# Patient Record
Sex: Female | Born: 1940 | Race: White | Hispanic: No | Smoking: Former smoker
Health system: Southern US, Community
[De-identification: ages and names within clinical notes are randomized; demographics above are authoritative.]

## PROBLEM LIST (undated history)

## (undated) DIAGNOSIS — R52 Pain, unspecified: Secondary | ICD-10-CM

## (undated) DIAGNOSIS — G2581 Restless legs syndrome: Secondary | ICD-10-CM

## (undated) DIAGNOSIS — I499 Cardiac arrhythmia, unspecified: Secondary | ICD-10-CM

## (undated) DIAGNOSIS — I10 Essential (primary) hypertension: Secondary | ICD-10-CM

## (undated) DIAGNOSIS — R0600 Dyspnea, unspecified: Secondary | ICD-10-CM

## (undated) DIAGNOSIS — F329 Major depressive disorder, single episode, unspecified: Secondary | ICD-10-CM

## (undated) DIAGNOSIS — H269 Unspecified cataract: Secondary | ICD-10-CM

## (undated) DIAGNOSIS — K219 Gastro-esophageal reflux disease without esophagitis: Secondary | ICD-10-CM

## (undated) DIAGNOSIS — Z87898 Personal history of other specified conditions: Secondary | ICD-10-CM

## (undated) DIAGNOSIS — F419 Anxiety disorder, unspecified: Secondary | ICD-10-CM

## (undated) DIAGNOSIS — R0601 Orthopnea: Secondary | ICD-10-CM

## (undated) DIAGNOSIS — R32 Unspecified urinary incontinence: Secondary | ICD-10-CM

## (undated) DIAGNOSIS — F22 Delusional disorders: Secondary | ICD-10-CM

## (undated) DIAGNOSIS — M1990 Unspecified osteoarthritis, unspecified site: Secondary | ICD-10-CM

## (undated) DIAGNOSIS — Z8719 Personal history of other diseases of the digestive system: Secondary | ICD-10-CM

## (undated) DIAGNOSIS — R609 Edema, unspecified: Secondary | ICD-10-CM

## (undated) DIAGNOSIS — F32A Depression, unspecified: Secondary | ICD-10-CM

## (undated) DIAGNOSIS — J449 Chronic obstructive pulmonary disease, unspecified: Secondary | ICD-10-CM

## (undated) DIAGNOSIS — R251 Tremor, unspecified: Secondary | ICD-10-CM

## (undated) DIAGNOSIS — H9190 Unspecified hearing loss, unspecified ear: Secondary | ICD-10-CM

## (undated) HISTORY — PX: TONSILLECTOMY AND ADENOIDECTOMY: SHX28

## (undated) HISTORY — PX: CARDIAC CATHETERIZATION: SHX172

## (undated) HISTORY — DX: Unspecified cataract: H26.9

## (undated) HISTORY — DX: Anxiety disorder, unspecified: F41.9

---

## 1986-06-07 HISTORY — PX: ABDOMINAL HYSTERECTOMY: SHX81

## 1986-06-07 HISTORY — PX: APPENDECTOMY: SHX54

## 1998-06-07 DIAGNOSIS — M810 Age-related osteoporosis without current pathological fracture: Secondary | ICD-10-CM | POA: Insufficient documentation

## 2002-06-07 HISTORY — PX: TRIGGER FINGER RELEASE: SHX641

## 2004-04-17 ENCOUNTER — Ambulatory Visit: Payer: Self-pay | Admitting: Family Medicine

## 2005-04-13 ENCOUNTER — Other Ambulatory Visit: Payer: Self-pay

## 2005-04-13 ENCOUNTER — Emergency Department: Payer: Self-pay | Admitting: Emergency Medicine

## 2005-08-24 ENCOUNTER — Ambulatory Visit: Payer: Self-pay | Admitting: Family Medicine

## 2005-10-12 ENCOUNTER — Ambulatory Visit: Payer: Self-pay | Admitting: Family Medicine

## 2006-02-17 ENCOUNTER — Ambulatory Visit: Payer: Self-pay | Admitting: Ophthalmology

## 2006-02-18 ENCOUNTER — Ambulatory Visit: Payer: Self-pay | Admitting: Family Medicine

## 2006-03-04 ENCOUNTER — Ambulatory Visit: Payer: Self-pay | Admitting: Family Medicine

## 2007-10-12 ENCOUNTER — Ambulatory Visit: Payer: Self-pay | Admitting: Family Medicine

## 2007-12-05 ENCOUNTER — Ambulatory Visit: Payer: Self-pay | Admitting: Family Medicine

## 2008-01-31 ENCOUNTER — Ambulatory Visit: Payer: Self-pay | Admitting: Gastroenterology

## 2008-01-31 LAB — HM COLONOSCOPY

## 2008-09-10 ENCOUNTER — Ambulatory Visit: Payer: Self-pay | Admitting: Family Medicine

## 2008-09-26 ENCOUNTER — Ambulatory Visit: Payer: Self-pay | Admitting: Family Medicine

## 2008-09-26 DIAGNOSIS — R4701 Aphasia: Secondary | ICD-10-CM | POA: Insufficient documentation

## 2008-11-26 DIAGNOSIS — R5381 Other malaise: Secondary | ICD-10-CM | POA: Insufficient documentation

## 2009-09-02 ENCOUNTER — Inpatient Hospital Stay: Payer: Self-pay | Admitting: Student

## 2009-11-07 ENCOUNTER — Inpatient Hospital Stay: Payer: Self-pay | Admitting: Student

## 2010-02-03 ENCOUNTER — Ambulatory Visit: Payer: Self-pay | Admitting: Family Medicine

## 2010-06-17 ENCOUNTER — Ambulatory Visit: Payer: Self-pay

## 2010-07-16 ENCOUNTER — Ambulatory Visit: Payer: Self-pay | Admitting: Family Medicine

## 2011-04-27 ENCOUNTER — Ambulatory Visit: Payer: Self-pay | Admitting: Family Medicine

## 2011-07-22 ENCOUNTER — Ambulatory Visit: Payer: Self-pay | Admitting: Family Medicine

## 2011-11-10 ENCOUNTER — Emergency Department: Payer: Self-pay | Admitting: *Deleted

## 2011-11-10 LAB — BASIC METABOLIC PANEL
Anion Gap: 11 (ref 7–16)
BUN: 19 mg/dL — ABNORMAL HIGH (ref 7–18)
Chloride: 105 mmol/L (ref 98–107)
Co2: 21 mmol/L (ref 21–32)
Creatinine: 0.73 mg/dL (ref 0.60–1.30)
EGFR (African American): >60
Osmolality: 275 (ref 275–301)

## 2011-11-10 LAB — CK TOTAL AND CKMB (NOT AT ARMC): CK-MB: 2.1 ng/mL (ref 0.5–3.6)

## 2011-11-10 LAB — TROPONIN I: Troponin-I: <0.02 ng/mL

## 2011-11-10 LAB — CBC
HCT: 43.8 % (ref 35.0–47.0)
HGB: 14.7 g/dL (ref 12.0–16.0)
MCH: 30.6 pg (ref 26.0–34.0)
Platelet: 185 10*3/uL (ref 150–440)
RDW: 14.6 % — ABNORMAL HIGH (ref 11.5–14.5)
WBC: 6 10*3/uL (ref 3.6–11.0)

## 2012-02-29 ENCOUNTER — Ambulatory Visit: Payer: Self-pay | Admitting: Family Medicine

## 2012-05-17 ENCOUNTER — Inpatient Hospital Stay: Payer: Self-pay | Admitting: Internal Medicine

## 2012-05-17 LAB — CBC
MCH: 29.7 pg (ref 26.0–34.0)
MCHC: 32.7 g/dL (ref 32.0–36.0)
Platelet: 177 10*3/uL (ref 150–440)
RBC: 4.3 10*6/uL (ref 3.80–5.20)

## 2012-05-17 LAB — COMPREHENSIVE METABOLIC PANEL
Albumin: 3.4 g/dL (ref 3.4–5.0)
Alkaline Phosphatase: 92 U/L (ref 50–136)
Anion Gap: 9 (ref 7–16)
Chloride: 100 mmol/L (ref 98–107)
Co2: 25 mmol/L (ref 21–32)
Creatinine: 0.74 mg/dL (ref 0.60–1.30)
EGFR (African American): >60
Glucose: 138 mg/dL — ABNORMAL HIGH (ref 65–99)
Osmolality: 270 (ref 275–301)
Potassium: 3.9 mmol/L (ref 3.5–5.1)
SGPT (ALT): 23 U/L (ref 12–78)
Sodium: 134 mmol/L — ABNORMAL LOW (ref 136–145)
Total Protein: 6.7 g/dL (ref 6.4–8.2)

## 2012-05-17 LAB — CK TOTAL AND CKMB (NOT AT ARMC)
CK, Total: 56 U/L (ref 21–215)
CK-MB: 0.9 ng/mL (ref 0.5–3.6)
CK-MB: 1.3 ng/mL (ref 0.5–3.6)

## 2012-05-17 LAB — PRO B NATRIURETIC PEPTIDE: B-Type Natriuretic Peptide: 260 pg/mL — ABNORMAL HIGH (ref 0–125)

## 2012-05-18 LAB — BASIC METABOLIC PANEL
Anion Gap: 10 (ref 7–16)
BUN: 17 mg/dL (ref 7–18)
Calcium, Total: 8.9 mg/dL (ref 8.5–10.1)
EGFR (Non-African Amer.): >60
Glucose: 144 mg/dL — ABNORMAL HIGH (ref 65–99)
Osmolality: 269 (ref 275–301)
Sodium: 132 mmol/L — ABNORMAL LOW (ref 136–145)

## 2012-05-18 LAB — CBC WITH DIFFERENTIAL/PLATELET
Eosinophil #: 0 10*3/uL (ref 0.0–0.7)
Eosinophil %: 0 %
Lymphocyte %: 14.6 %
MCH: 30.6 pg (ref 26.0–34.0)
MCHC: 34.2 g/dL (ref 32.0–36.0)
Monocyte #: 0.3 x10 3/mm (ref 0.2–0.9)
Neutrophil %: 82.8 %
Platelet: 202 10*3/uL (ref 150–440)
RBC: 4.02 10*6/uL (ref 3.80–5.20)
RDW: 13.5 % (ref 11.5–14.5)

## 2012-05-18 LAB — CK TOTAL AND CKMB (NOT AT ARMC)
CK, Total: 101 U/L (ref 21–215)
CK-MB: 2.5 ng/mL (ref 0.5–3.6)

## 2012-06-01 ENCOUNTER — Ambulatory Visit: Payer: Self-pay | Admitting: Family Medicine

## 2012-10-30 ENCOUNTER — Emergency Department: Payer: Self-pay | Admitting: Emergency Medicine

## 2012-10-30 LAB — CK TOTAL AND CKMB (NOT AT ARMC)
CK, Total: 74 U/L
CK-MB: 0.6 ng/mL

## 2012-10-30 LAB — BASIC METABOLIC PANEL WITH GFR
Anion Gap: 9
BUN: 14 mg/dL
Calcium, Total: 9.1 mg/dL
Chloride: 104 mmol/L
Co2: 25 mmol/L
Creatinine: 0.71 mg/dL
EGFR (African American): >60
EGFR (Non-African Amer.): >60
Glucose: 100 mg/dL — ABNORMAL HIGH
Osmolality: 276
Potassium: 4.5 mmol/L
Sodium: 138 mmol/L

## 2012-10-30 LAB — CBC
HCT: 44.9 %
HGB: 15.1 g/dL
MCH: 29.6 pg
MCHC: 33.7 g/dL
MCV: 88 fL
Platelet: 157 x10 3/mm 3
RBC: 5.12 X10 6/mm 3
RDW: 14.5 %
WBC: 5.3 x10 3/mm 3

## 2012-10-30 LAB — RAPID INFLUENZA A&B ANTIGENS

## 2012-10-30 LAB — TROPONIN I: Troponin-I: <0.02 ng/mL

## 2012-10-31 ENCOUNTER — Inpatient Hospital Stay: Payer: Self-pay | Admitting: Internal Medicine

## 2012-10-31 LAB — URINALYSIS, COMPLETE
Bilirubin,UR: NEGATIVE
Blood: NEGATIVE
Glucose,UR: NEGATIVE mg/dL (ref 0–75)
Ketone: NEGATIVE
Ph: 6 (ref 4.5–8.0)
Protein: 100
RBC,UR: 1 /HPF (ref 0–5)
Specific Gravity: 1.015 (ref 1.003–1.030)

## 2012-10-31 LAB — COMPREHENSIVE METABOLIC PANEL
Albumin: 3.4 g/dL (ref 3.4–5.0)
Alkaline Phosphatase: 88 U/L (ref 50–136)
Anion Gap: 6 — ABNORMAL LOW (ref 7–16)
Bilirubin,Total: 0.4 mg/dL (ref 0.2–1.0)
EGFR (African American): >60
Osmolality: 277 (ref 275–301)
SGPT (ALT): 22 U/L (ref 12–78)
Sodium: 136 mmol/L (ref 136–145)

## 2012-10-31 LAB — CBC
HCT: 42.8 % (ref 35.0–47.0)
HGB: 14.4 g/dL (ref 12.0–16.0)
MCH: 29.8 pg (ref 26.0–34.0)
MCHC: 33.7 g/dL (ref 32.0–36.0)
MCV: 89 fL (ref 80–100)
Platelet: 144 x10 3/mm 3 — ABNORMAL LOW (ref 150–440)
RBC: 4.84 X10 6/mm 3 (ref 3.80–5.20)
RDW: 14.5 % (ref 11.5–14.5)
WBC: 7.5 x10 3/mm 3 (ref 3.6–11.0)

## 2012-10-31 LAB — CK TOTAL AND CKMB (NOT AT ARMC)
CK, Total: 59 U/L (ref 21–215)
CK-MB: 1.9 ng/mL (ref 0.5–3.6)

## 2012-10-31 LAB — TROPONIN I: Troponin-I: 0.07 ng/mL — ABNORMAL HIGH

## 2012-11-01 LAB — LIPID PANEL
Cholesterol: 188 mg/dL (ref 0–200)
Triglycerides: 59 mg/dL (ref 0–200)
VLDL Cholesterol, Calc: 12 mg/dL (ref 5–40)

## 2012-11-03 LAB — CBC WITH DIFFERENTIAL/PLATELET
Eosinophil %: 0 %
HGB: 13.8 g/dL (ref 12.0–16.0)
Lymphocyte %: 19.5 %
MCHC: 34.4 g/dL (ref 32.0–36.0)
MCV: 87 fL (ref 80–100)
Monocyte #: 0.4 x10 3/mm (ref 0.2–0.9)
Monocyte %: 7.8 %
Neutrophil #: 4 10*3/uL (ref 1.4–6.5)
Platelet: 159 10*3/uL (ref 150–440)
WBC: 5.5 10*3/uL (ref 3.6–11.0)

## 2012-11-03 LAB — BASIC METABOLIC PANEL
Anion Gap: 7 (ref 7–16)
Calcium, Total: 9.4 mg/dL (ref 8.5–10.1)
Co2: 29 mmol/L (ref 21–32)
EGFR (Non-African Amer.): >60
Osmolality: 264 (ref 275–301)
Potassium: 3.8 mmol/L (ref 3.5–5.1)

## 2012-11-04 LAB — BASIC METABOLIC PANEL
Anion Gap: 6 — ABNORMAL LOW (ref 7–16)
Chloride: 92 mmol/L — ABNORMAL LOW (ref 98–107)
Co2: 30 mmol/L (ref 21–32)
Creatinine: 0.82 mg/dL (ref 0.60–1.30)
EGFR (African American): >60
EGFR (Non-African Amer.): >60
Potassium: 3.8 mmol/L (ref 3.5–5.1)

## 2012-11-04 LAB — THEOPHYLLINE LEVEL: Theophylline: 10.7 ug/mL (ref 10.0–20.0)

## 2012-11-04 LAB — URIC ACID: Uric Acid: 6.1 mg/dL — ABNORMAL HIGH (ref 2.6–6.0)

## 2012-11-05 LAB — BASIC METABOLIC PANEL
Anion Gap: 8 (ref 7–16)
BUN: 32 mg/dL — ABNORMAL HIGH (ref 7–18)
Co2: 30 mmol/L (ref 21–32)
Creatinine: 0.97 mg/dL (ref 0.60–1.30)
EGFR (African American): >60
EGFR (Non-African Amer.): 58 — ABNORMAL LOW
Potassium: 3.4 mmol/L — ABNORMAL LOW (ref 3.5–5.1)
Sodium: 130 mmol/L — ABNORMAL LOW (ref 136–145)

## 2012-11-08 LAB — BASIC METABOLIC PANEL
Anion Gap: 6 — ABNORMAL LOW (ref 7–16)
BUN: 25 mg/dL — ABNORMAL HIGH (ref 7–18)
Calcium, Total: 9.1 mg/dL (ref 8.5–10.1)
Chloride: 96 mmol/L — ABNORMAL LOW (ref 98–107)
Creatinine: 0.91 mg/dL (ref 0.60–1.30)
EGFR (Non-African Amer.): >60
Potassium: 3.3 mmol/L — ABNORMAL LOW (ref 3.5–5.1)

## 2013-05-01 ENCOUNTER — Ambulatory Visit: Payer: Self-pay | Admitting: Family Medicine

## 2013-06-08 ENCOUNTER — Ambulatory Visit: Payer: Self-pay | Admitting: Family Medicine

## 2014-01-21 LAB — HEPATIC FUNCTION PANEL
ALT: 11 U/L (ref 7–35)
AST: 15 U/L (ref 13–35)
Alkaline Phosphatase: 90 U/L (ref 25–125)
Bilirubin, Total: 0.4 mg/dL

## 2014-01-21 LAB — CBC AND DIFFERENTIAL
HCT: 43 % (ref 36–46)
HEMOGLOBIN: 14.2 g/dL (ref 12.0–16.0)
NEUTROS ABS: 65 /uL
PLATELETS: 185 10*3/uL (ref 150–399)
WBC: 4.8 10^3/mL

## 2014-01-21 LAB — BASIC METABOLIC PANEL
BUN: 8 mg/dL (ref 4–21)
CREATININE: 0.8 mg/dL (ref 0.5–1.1)
Glucose: 85 mg/dL
Potassium: 4.5 mmol/L (ref 3.4–5.3)
SODIUM: 139 mmol/L (ref 137–147)

## 2014-01-22 ENCOUNTER — Ambulatory Visit: Payer: Self-pay | Admitting: Family Medicine

## 2014-03-27 LAB — LIPID PANEL
Cholesterol: 222 mg/dL — AB (ref 0–200)
HDL: 71 mg/dL — AB (ref 35–70)
LDL CALC: 132 mg/dL
Triglycerides: 97 mg/dL (ref 40–160)

## 2014-04-01 DIAGNOSIS — J449 Chronic obstructive pulmonary disease, unspecified: Secondary | ICD-10-CM | POA: Insufficient documentation

## 2014-04-01 DIAGNOSIS — Z8639 Personal history of other endocrine, nutritional and metabolic disease: Secondary | ICD-10-CM | POA: Insufficient documentation

## 2014-06-17 ENCOUNTER — Ambulatory Visit: Payer: Self-pay | Admitting: Family Medicine

## 2014-06-17 DIAGNOSIS — M5186 Other intervertebral disc disorders, lumbar region: Secondary | ICD-10-CM | POA: Diagnosis not present

## 2014-06-17 DIAGNOSIS — M5386 Other specified dorsopathies, lumbar region: Secondary | ICD-10-CM | POA: Diagnosis not present

## 2014-06-17 DIAGNOSIS — M545 Low back pain: Secondary | ICD-10-CM | POA: Diagnosis not present

## 2014-06-17 DIAGNOSIS — M47816 Spondylosis without myelopathy or radiculopathy, lumbar region: Secondary | ICD-10-CM | POA: Diagnosis not present

## 2014-06-17 DIAGNOSIS — M25552 Pain in left hip: Secondary | ICD-10-CM | POA: Diagnosis not present

## 2014-06-17 DIAGNOSIS — M8580 Other specified disorders of bone density and structure, unspecified site: Secondary | ICD-10-CM | POA: Diagnosis not present

## 2014-06-17 DIAGNOSIS — M5136 Other intervertebral disc degeneration, lumbar region: Secondary | ICD-10-CM | POA: Diagnosis not present

## 2014-06-17 DIAGNOSIS — M47896 Other spondylosis, lumbar region: Secondary | ICD-10-CM | POA: Diagnosis not present

## 2014-07-09 DIAGNOSIS — B354 Tinea corporis: Secondary | ICD-10-CM | POA: Diagnosis not present

## 2014-07-29 DIAGNOSIS — H5210 Myopia, unspecified eye: Secondary | ICD-10-CM | POA: Diagnosis not present

## 2014-07-29 DIAGNOSIS — H5213 Myopia, bilateral: Secondary | ICD-10-CM | POA: Diagnosis not present

## 2014-08-01 DIAGNOSIS — F51 Insomnia not due to a substance or known physiological condition: Secondary | ICD-10-CM | POA: Diagnosis not present

## 2014-08-01 DIAGNOSIS — F4310 Post-traumatic stress disorder, unspecified: Secondary | ICD-10-CM | POA: Diagnosis not present

## 2014-08-01 DIAGNOSIS — F251 Schizoaffective disorder, depressive type: Secondary | ICD-10-CM | POA: Diagnosis not present

## 2014-08-22 DIAGNOSIS — I10 Essential (primary) hypertension: Secondary | ICD-10-CM | POA: Diagnosis not present

## 2014-08-22 DIAGNOSIS — Z0000 Encounter for general adult medical examination without abnormal findings: Secondary | ICD-10-CM | POA: Diagnosis not present

## 2014-08-22 DIAGNOSIS — J449 Chronic obstructive pulmonary disease, unspecified: Secondary | ICD-10-CM | POA: Diagnosis not present

## 2014-08-22 DIAGNOSIS — K219 Gastro-esophageal reflux disease without esophagitis: Secondary | ICD-10-CM | POA: Diagnosis not present

## 2014-09-24 NOTE — H&P (Signed)
PATIENT NAME:  Vicki Morales, Vicki Morales MR#:  [id number] DATE OF BIRTH:  Oct 05, 1940  DATE OF ADMISSION:  05/17/2012  PRIMARY CARE PHYSICIAN: Lelon Huh, MD   REFERRING PHYSICIAN: Dr. Dahlia Client   CHIEF COMPLAINT: Shortness of breath.  HISTORY OF PRESENT ILLNESS: The patient is a 74 year old Caucasian female with a past medical history of COPD, anxiety and depression, hypertension, insomnia, and prior admissions for chest pain with negative cardiac cath in the year 09-17-2009 who presented to the ER with chief complaint of shortness of breath associated with productive cough since last Wednesday. The patient denies any fever or sick contacts. The patient has been experiencing tightness in her chest for the past two weeks and became short of breath since Wednesday and slowly it has been getting worse associated with productive cough with whitish-yellow phlegm. Denies blood in her phlegm. The patient was seen by her primary care physician who started her on doxycycline and p.o. prednisone with no significant improvement. Though the patient is on antibiotics orally, the situation is getting worse and so the patient came to the ER for tightness in her chest associated with shortness of breath. Denies any abdominal pain, nausea, vomiting, or diarrhea. Denies any diaphoresis. Denies any chest pain. The patient has a LIVING WILL and her CODE STATUS IS DO NOT RESUSCITATE and the patient wants [place] to continue her CODE STATUS as DO NOT RESUSCITATE. No family members are at bedside. Denies any other complaints. In the ER, the patient has received DuoNeb treatments x3 with no significant improvement. Prednisone 60 mg p.o. was given by the ER physician and eventually the patient has received Solu-Medrol 100 mg IV. Hospitalist team was called to admit the patient for worsening of shortness of breath from acute exacerbation of COPD. The patient has received azithromycin antibiotic while the patient was in the ER.   PAST MEDICAL  HISTORY: 1. COPD, not on any home oxygen. 2. History of anxiety and depression. 3. Hypertension. 4. Insomnia. 5. Prior history of admission for chest pain with negative cardiac cath in Sep 17, 2009. The patient's cardiologist is Dr. Ubaldo Glassing and Dr. Saralyn Pilar.  PAST SURGICAL HISTORY: Cardiac catheterization.  ALLERGIES: Allergic to codeine, Darvon, heparin, morphine, Navane, penicillin, and sulfa.   HOME MEDICATIONS:  1. VESIcare 5 mg once daily. 2. Trifluoperazine 10 mg twice a day. 3. Trazodone 100 mg p.o. once a day. 4. Theophylline 200 mg one orally in a.m. and one at night.  5. Super B Complex one tablet once a day. 6. Sertraline 100 mg once a day. 7. Prilosec over-the-counter 20 mg once a day. 8. Omega-3 fatty acid one tablet twice a day. 9. Multivitamin one tablet once a day. 10. Magnesium oxide one tablet once a day. 11. Lecithin 1200 mg once caplet once a day. 12. Hydrochlorothiazide 12.5 mg once a day. 13. Fluticasone 50 mcg inhalation once a day. 14. Aleve 220 mg q.8 hours. 15. Albuterol neb treatment q.8 hours as needed for shortness of breath. 16. Advair Diskus 250/50 one inhalation twice a day. 17. Amlodipine 5 mg once a day.  PSYCHOSOCIAL HISTORY: Lives at home with her daughter. Husband passed away in the year 09/17/09. The patient used to smoke but quit smoking several years ago. Denies any alcohol or illicit drug usage.   FAMILY HISTORY: Mother died of old age. Father had history of stroke.   REVIEW OF SYSTEMS: CONSTITUTIONAL: Denies any fever. Complaining of fatigue. Denies any weight loss or weight gain. EYES: Denies any blurry vision, glaucoma, cataracts, redness in  her eyes. ENT: Denies any tinnitus, ear pain, discharge, epistaxis, difficulty swallowing. RESPIRATORY: Positive cough productive in nature. Complaining of difficulty breathing. Denies hemoptysis or pneumonia. Complaining of shortness of breath. CARDIOVASCULAR: Denies any chest pain, orthopnea, syncope,  varicose veins, erythema, palpitations. GI: Denies nausea or diarrhea. Complaining of nausea after coughing. Denies any abdominal pain, hematemesis, rectal bleeding, melena, change in bowel habits. GENITOURINARY: Denies dysuria, hematuria, renal calculi. GYN: Denies any breast masses or vaginal discharge. ENDOCRINE: Denies polyuria, polyphagia, polydipsia, thyroid problems. HEMATOLOGIC/LYMPHATIC: Denies anemia, easy bruising. MUSCULOSKELETAL: Denies any pain in the neck, back, shoulder, gout, arthritis. NEUROLOGIC: Denies any numbness, vertigo, ataxia, weakness, dementia, headache. PSYCH: Denies anxiety, nervousness, obsessive compulsive disorder.  PHYSICAL EXAMINATION:   VITAL SIGNS: Temperature 98.1, pulse 92, respiratory rate 17, blood pressure 109/62, pulse oximetry 94% on room air.   GENERAL APPEARANCE: Not in acute distress. Moderately built. Moderately nourished.   HEENT: Normocephalic and atraumatic. Pupils equal and reactive to light and accommodation. No scleral icterus. Extraocular movements are intact. No sinus tenderness. No postnasal discharge. No pharyngeal erythema. Moist mucous membranes.   NECK: Supple. No JVD. No thyromegaly. No carotid bruits.   LUNGS: Coarse bronchial breath sounds are present. Minimal diffuse wheezing is present. No accessory muscle usage. No chest wall tenderness.   CARDIOVASCULAR: S1, S2 normal. Regular rate and rhythm. No pedal edema. Peripheral pulses are 2+.   GI: Soft, obese. Bowel sounds are positive in all four quadrants. Nontender. Nondistended. No masses felt.   NEUROLOGIC: Awake, alert, and oriented x3. Motor and sensory are grossly intact. Cranial nerves II through XII are grossly intact. Reflexes are 2+.  SKIN: No rashes. No lesions. No erythema.   MUSCULOSKELETAL: No joint edema, effusion, tenderness, or erythema. Range of motion is intact.   PSYCH: Normal mood and affect.   LABS AND IMAGING STUDIES: Glucose 138, BNP 260, BUN 11,  creatinine 0.74, sodium 134, potassium 3.9, chloride 100, CO2 25, GFR greater than 60, anion gap 9, serum osmolality 270, calcium 9.2. Troponin-I less than 0.02. AST 23, ALT 23, total bilirubin 0.5, albumin 3.4, total protein 6.7, WBC 6.9, hemoglobin 12.8, hematocrit 39.0, platelet count 177,000, MCV 91.   12-lead EKG has revealed sinus arrhythmia but no acute ST-T wave changes.   ASSESSMENT AND PLAN: This is a 74 year old female presenting to the ER with a chief complaint of shortness of breath associated with productive cough since past Wednesday. She will be admitted with the following assessment and plan.  1. Acute exacerbation of COPD. Will admit to tele bed. Will provide her Solu-Medrol 80 mg IV q.8 hours. Has already had DuoNeb treatment. Albuterol neb treatment q.4 hours as needed. Levaquin 750 mg IV x1 and pharmacy to dose. Will cycle cardiac biomarkers.  2. History of anxiety and depression, stable. Continue her home medications.  3. Hypertension. Monitor her blood pressure. For now will continue her home medications for hypertension. If necessary, will up titrate the medication as needed. 4. Insomnia, stable. Continue her home medication, Trazodone.  5. Will check a.m. labs and will also check theophylline level.   CODE STATUS: DO NOT RESUSCITATE.   The diagnosis and plan of care was discussed in detail with the patient. She voiced understanding of the plan.   TOTAL TIME SPENT ON THE ADMISSION: 60 minutes.   ____________________________ Nicholes Mango, MD ag:drc D: 05/17/2012 04:13:09 ET T: 05/17/2012 07:32:44 ET JOB#: [id number]  cc: Nicholes Mango, MD, <Dictator> Kirstie Peri. Caryn Section, MD Nicholes Mango MD ELECTRONICALLY SIGNED 05/18/2012 1:42

## 2014-09-25 ENCOUNTER — Emergency Department
Admit: 2014-09-25 | Disposition: A | Discharge status: Home / Self Care | Payer: Self-pay | Admitting: Emergency Medicine

## 2014-09-25 DIAGNOSIS — Z7951 Long term (current) use of inhaled steroids: Secondary | ICD-10-CM | POA: Diagnosis not present

## 2014-09-25 DIAGNOSIS — I10 Essential (primary) hypertension: Secondary | ICD-10-CM | POA: Diagnosis not present

## 2014-09-25 DIAGNOSIS — Z79899 Other long term (current) drug therapy: Secondary | ICD-10-CM | POA: Diagnosis not present

## 2014-09-25 DIAGNOSIS — R140 Abdominal distension (gaseous): Secondary | ICD-10-CM | POA: Diagnosis not present

## 2014-09-25 DIAGNOSIS — Z87891 Personal history of nicotine dependence: Secondary | ICD-10-CM | POA: Diagnosis not present

## 2014-09-25 DIAGNOSIS — R1013 Epigastric pain: Secondary | ICD-10-CM | POA: Diagnosis not present

## 2014-09-25 DIAGNOSIS — Z880 Allergy status to penicillin: Secondary | ICD-10-CM | POA: Diagnosis not present

## 2014-09-25 LAB — URINALYSIS, COMPLETE
BILIRUBIN, UR: NEGATIVE
Blood: NEGATIVE
Glucose,UR: NEGATIVE mg/dL (ref 0–75)
Ketone: NEGATIVE
Nitrite: NEGATIVE
Ph: 8 (ref 4.5–8.0)
Protein: NEGATIVE
RBC, UR: NONE SEEN /HPF (ref 0–5)
Specific Gravity: 1.004 (ref 1.003–1.030)

## 2014-09-25 LAB — COMPREHENSIVE METABOLIC PANEL
ALT: 18 U/L
AST: 22 U/L
Albumin: 4.2 g/dL
Alkaline Phosphatase: 84 U/L
Anion Gap: 6 — ABNORMAL LOW (ref 7–16)
BUN: 15 mg/dL
Bilirubin,Total: 0.7 mg/dL
CALCIUM: 9.5 mg/dL
CHLORIDE: 103 mmol/L
Co2: 28 mmol/L
Creatinine: 0.82 mg/dL
Glucose: 85 mg/dL
Potassium: 3.7 mmol/L
Sodium: 137 mmol/L
TOTAL PROTEIN: 6.7 g/dL

## 2014-09-25 LAB — CBC
HCT: 44.7 % (ref 35.0–47.0)
HGB: 14.6 g/dL (ref 12.0–16.0)
MCH: 30.6 pg (ref 26.0–34.0)
MCHC: 32.7 g/dL (ref 32.0–36.0)
MCV: 94 fL (ref 80–100)
Platelet: 146 10*3/uL — ABNORMAL LOW (ref 150–440)
RBC: 4.78 10*6/uL (ref 3.80–5.20)
RDW: 14.8 % — AB (ref 11.5–14.5)
WBC: 4.7 10*3/uL (ref 3.6–11.0)

## 2014-09-25 LAB — TROPONIN I: Troponin-I: <0.03 ng/mL

## 2014-09-25 LAB — LIPASE, BLOOD: Lipase: 25 U/L

## 2014-09-26 ENCOUNTER — Ambulatory Visit
Admit: 2014-09-26 | Disposition: A | Discharge status: Home / Self Care | Payer: Self-pay | Attending: Family Medicine | Admitting: Family Medicine

## 2014-09-26 DIAGNOSIS — R1310 Dysphagia, unspecified: Secondary | ICD-10-CM | POA: Diagnosis not present

## 2014-09-26 LAB — URINE CULTURE

## 2014-09-27 NOTE — Discharge Summary (Signed)
PATIENT NAME:  Vicki Morales, CUDNEY MR#:  [id number] DATE OF BIRTH:  01-04-1941  DATE OF ADMISSION:  05/17/2012 DATE OF DISCHARGE:  05/20/2012  PRIMARY CARE PHYSICIAN:  Dr. Lelon Huh.    DISCHARGE DIAGNOSES:  Chronic obstructive pulmonary disease exacerbation, bronchitis due to gram-negative rods, hypertension.   HISTORY OF PRESENT ILLNESS: The patient is a 74 year old Caucasian female with past medical history of COPD, anxiety and depression, hypertension, insomnia and prior admission for chest pain with negative cardiac cath in year 2011.  She presented to the Emergency Room with chief complaint of shortness of breath associated with productive cough since last Wednesday. The patient denies any fever or sick contacts. The patient had been experiencing tightness in her chest for past 2 weeks and has became short of breath since Wednesday, and slowly it has been getting worse associated with productive cough with reddish-yellow phlegm. Denies blood in her phlegm. The patient was seen by her primary care physician who started her on doxycycline and p.o. prednisone with no significant improvement though the patient is on antibiotics orally.  The situation is getting worse and so she came to the Emergency Room. In the ER patient received DuoNeb treatment 3 times with no significant improvement.   HOSPITAL COURSE AND STAY: She was admitted with COPD exacerbation with IV Solu-Medrol and DuoNeb treatment, and started on Levaquin for her bronchitis symptoms. She had gradual but slow improvement in her symptoms over the next 2 to 3 days and her sputum culture developed gram-negative rods which were sensitive to levofloxacin and so that was continued and discharged her on the same medication with tapering dose of steroids.   OTHER MEDICAL ISSUE ADDRESSED DURING THIS HOSPITAL STAY: Hypertension. We continued her on home medication, hydrochlorothiazide and Norvasc. Hypokalemia which was replaced and resolved.   Depression and anxiety. Continued home medications.   IMPORTANT LAB RESULTS DURING THE HOSPITAL STAY: Potassium was low to 3.1 which came up to 3.5. Sputum culture grew Enterobacter __________ which was sensitive to  levofloxacin. Chest x-ray was without evidence of acute cardiopulmonary disease.   CONDITION ON DISCHARGE: Stable.   CODE STATUS ON DISCHARGE: DO NOT RESUSCITATE.   MEDICATIONS ON DISCHARGE: Caltrate 600 mg with vitamin D 1 tablet by mouth 2 times a day. Super B complex by mouth once a day. Multivitamin 1 tablet orally. Prilosec 20 mg delayed-release once a day. Fluticasone 50 mcg inhaled nasal spray once a day. Amlodipine 5 mg oral tablet, 0.5 __________ orally once a day. Sertraline 100 mg oral tablet, 0.5 tablets orally once a day. Theophylline 200 mg oral tablet extended-release 2 times a day. Trazodone 100 mg oral tablet, 0.5 orally once a day at bedtime. Trifluoperazine 10 mg oral tablet, 2 tablets orally once a day at bedtime. Magnesium glucose of 250 mg oral tablet once a day. Advair Diskus 295mcg/50 mcg inhaled 1 puff 2 times a day. Prednisone 10 mg oral tablet tapering 10 mg daily until complete. Hydrochlorothiazide 12.5 mg once a day. Levofloxacin 500 mg oral tablet once a day.   DISCHARGE INSTRUCTIONS:  Discharged home and home health aide on discharge. Diet on discharge low sodium, low fat, low cholesterol diet. Diet consistency regular on discharge. Activity limitation as tolerated. Return to work after followup with primary care physician and advised to follow up within 1 to 2 weeks with Dr. Lelon Huh, [facility].   TOTAL TIME SPENT ON DISCHARGE: 45 minutes.     ____________________________ Ceasar Lund Anselm Jungling, MD vgv:cs D: 05/22/2012 09:62:83 ET  T: 05/23/2012 18:28:39 ET JOB#: [id number]  cc: Ceasar Lund. Anselm Jungling, MD, <Dictator> Kirstie Peri. Caryn Section, MD Vaughan Basta MD ELECTRONICALLY SIGNED 06/19/2012 23:20

## 2014-09-27 NOTE — Discharge Summary (Signed)
PATIENT NAME:  Vicki Morales, Vicki Morales MR#:  [id number] DATE OF BIRTH:  1940-09-21  DATE OF ADMISSION:  10/31/2012 DATE OF DISCHARGE:  11/08/2012   ADMITTING PHYSICIAN: [place]is Pu. Lenore Manner, MD  DISCHARGING PHYSICIAN: Gladstone Lighter, MD  PRIMARY CARE PHYSICIAN: [place] Caryn Section, MD  [place]: Pulmonary consultation by Dr. Raul Del.   DISCHARGE DIAGNOSES:  1. Acute on chronic respiratory failure.  2. Chronic obstructive pulmonary disease exacerbation with acute bronchitis.  3. Anxiety and depression.  4. Hypertension.  5. Hyponatremia.  6. Elevated troponin secondary to demand ischemia.  DISCHARGE HOME MEDICATIONS: .  1. Caltrate 600 mg with vitamin D 1 tablet p.o. b.i.d.  2. Super B Complex tablet 1 tablet p.o. daily.  3. Multivitamin 1 tablet p.o. daily.  4. Prilosec 20 mg p.o. daily.  5. Fluticasone 50 mcg spray daily.  6. Amlodipine 5 mg p.o. daily.  7. Sertraline 50 mg p.o. daily.  8. Theophylline 200 mg p.o. b.i.d.  9. Trazodone 50 mg p.o. at bedtime.  10. Trifluoperazine 20 mg p.o. at bedtime.  11. Magnesium gluconate 250 mg daily.  12. Prednisone 60 mg, taper by 10 mg daily.  13. Advair 250/50 one puff b.i.d.  14. Tramadol 50 mg q.6 hours p.r.n. for pain.  15. Atorvastatin 20 mg p.o. daily.  16. Aspirin 325 mg p.o. daily.  17. Benzonatate 100 mg p.o. q.6 hours. 18. Spiriva capsule daily.  19. DuoNebs 3 mL q.6 hours p.r.n. for wheezing.  20. Metoprolol 25 mg p.o. b.i.d.  21. Xanax 0.5 mg p.o. q.6 hours p.r.n. for anxiety.   DISCHARGE DIET: Low-sodium, regular consistency.   HOME OXYGEN: None.   ACTIVITY: As tolerated.    FOLLOWUP INSTRUCTIONS:  1. PCP followup in 2 weeks..  2. Pulmonary followup in 2 weeks with Dr. Raul Del.   LABS AND IMAGING STUDIES PRIOR TO DISCHARGE:  1. Sodium 133, potassium 3.3, which was replaced, chloride 96, bicarbonate 31, BUN 25, creatinine 0.91, glucose 106, calcium of 9.1.  2. WBC is 5.5, hemoglobin 13.8, hematocrit  40.2, platelet count 159. Serum IgE is within normal limits. Uric acid slightly elevated at 6.1. Theophylline level is normal at 10.7.  3. CT of the chest with contrast showing emphysematous changes in both lungs. No pneumonia or bronchiectasis. No pleural or pericardial effusion. Normal cardiac chambers. No acute thoracic pathology or aortic pathology noted. Small hiatal hernia is present.  4. Echo Doppler with normal LV ejection fraction 60% to 65% and mildly dilated left atrium is present.   BRIEF HOSPITAL COURSE: Vicki Morales is a 74 year old elderly Caucasian female with past medical history significant for COPD, not on home oxygen, hypertension, who presented to the ER secondary to difficulty breathing and also wheezing. She had a low-grade fever; however, her chest x-ray did not show any acute infiltrate. She was hypoxic, requiring 4 liters of oxygen initially on admission.  1. Acute respiratory failure secondary to chronic obstructive pulmonary disease exacerbation with acute bronchitis. She was admitted to CCU, was on IV Solu-Medrol, DuoNebs and also Levaquin was added. Her wheezing was not getting any better, so pulmonary consult by Dr. Raul Del was also requested. She did require IV Solu-Medrol for a longer period of time. Her theophylline, Advair and Spiriva were continued along with aggressive DuoNebs. Cough medication was added p.r.n., and she did finish a 10-day course of Levaquin while in the hospital. She is stable at this point and is ready to be discharged to a rehab as recommended by physical therapy. She is currently  saturating more than 90% on room air at rest and also on minimal exertion, so she is not being discharged on home oxygen. She did have mild elevation in her troponin during admission and was seen by cardiology initially, but deemed to be secondary to demand ischemia from her hypoxia.  2. Anxiety and depression. Continue her home medication. She is on trazodone, Xanax,  trifluoperazine and also sertraline at home.   3. Hyponatremia. She was on HCTZ while in the hospital, which was stopped. 4. Her course has been otherwise uneventful in the hospital.   DISCHARGE CONDITION: Stable.   DISCHARGE DISPOSITION: Short-term rehab, likely to [facility].  TIME SPENT ON DISCHARGE: 45 minutes.   ____________________________ Gladstone Lighter, MD rk:OSi D: 11/08/2012 09:37:33 ET T: 11/08/2012 09:56:23 ET JOB#: [id number]  cc: Gladstone Lighter, MD, <Dictator> Herbon E. Raul Del, MD Kirstie Peri Caryn Section, MD Gladstone Lighter MD ELECTRONICALLY SIGNED 11/13/2012 15:33

## 2014-09-27 NOTE — H&P (Signed)
PATIENT NAME:  Vicki Morales, Vicki Morales MR#:  [id number] DATE OF BIRTH:  04/01/1941  DATE OF ADMISSION:  10/31/2012  PRIMARY CARE PHYSICIAN: Lelon Huh, MD   REFERRING PHYSICIAN: Marjean Donna, MD  CHIEF COMPLAINT: Wheezing and increased shortness of breath.   HISTORY OF PRESENT ILLNESS: Vicki Morales is a 74 year old Caucasian female with a history of chronic obstructive pulmonary disease and systemic hypertension. The patient developed recently wheezing, shortness of breath, cough with whitish sputum, got worse over time. She came to the Emergency Department yesterday, treated with bronchodilator therapy and discharged on Levaquin antibiotic along with steroid tapering doses.  However, the patient came back again today by EMS stating that her symptoms worsened in terms of wheezing and shortness of breath and cough. Along with that, she has low-grade fever of 100.8. She states that the cough got worse to the extent that she developed dizziness. Is hardly able to breathe or to talk.  Denies any chest pain. No abdominal pain. No vomiting. Evaluation here is consistent with acute exacerbation of chronic obstructive pulmonary disease. Her chest x-ray was negative for consolidation. EKG showed sinus tachycardia, however, her troponin was marginally elevated at 0.07. The patient was admitted to telemetry for treatment of her acute exacerbation of COPD and also to follow up on her troponin.   REVIEW OF SYSTEMS: CONSTITUTIONAL: Denies having any chills, but she has low grade fever. She also developed mild fatigue.  EYES: No blurring of vision. No double vision.  ENT: No hearing impairment. No sore throat. No dysphagia.  CARDIOVASCULAR: No chest pain, but she has shortness of breath and wheezing. No syncope.  RESPIRATORY: Wheezing and increased shortness of breath and cough with whitish sputum. No hemoptysis.  GASTROINTESTINAL: No abdominal pain. No vomiting. No diarrhea.  GENITOURINARY: No dysuria. No frequency of  urination.  MUSCULOSKELETAL: No joint pain or swelling. No muscular pain or swelling.  INTEGUMENTARY: No skin rash. No ulcers.  NEUROLOGY: No focal weakness. No seizure activity noted.  PSYCHIATRY: She has history of anxiety and depression. This is not worse lately.  ENDOCRINE: No polyuria or polydipsia. No heat or cold intolerance.   PAST MEDICAL HISTORY: Chronic obstructive pulmonary disease, systemic hypertension, insomnia, anxiety, and depression.   PAST SURGICAL HISTORY: Hysterectomy in 1988 and history of prior cardiac catheterization.   FAMILY HISTORY: Her father died from massive stroke and he also suffered from emphysema. Her mother died at age of 65 from old age.   SOCIAL HABITS: Ex-chronic smoker. She quit in 1990. No history of alcohol or other drug abuse.   SOCIAL HISTORY: She is widowed. The patient currently lives with her daughter.   ADMISSION MEDICATIONS:  1.  Theophylline 200 mg twice a day. 2.  Trazodone 50 mg once a day at night.  3.  Trifluoperazine 10 mg 2 tablets at night, that is 20 mg at night. 4.  Multivitamin once a day. 5.  Zoloft 50 mg once a day. 6.  Prilosec 20 mg a day. 7.  Hydrochlorothiazide 12.5 mg once a day. 8.  Amlodipine 2.5 mg once a day. 9.  Advair Diskus 250/50 twice a day. 10.  Yesterday placed on Levaquin and prednisone tapering.   ALLERGIES: CODEINE, MORPHINE, HEPARIN CAUSING SKIN HIVES, PENICILLIN, SULFA, NAVANE, AND DARVON.   PHYSICAL EXAMINATION: VITAL SIGNS: Blood pressure 127/67, respiratory rate 22, pulse 96, temperature 98.9, oxygen saturation 95%.  GENERAL APPEARANCE: Elderly female lying on bed in no acute distress.  HEAD AND NECK: No pallor.  No icterus. No cyanosis.  EARS:  Revealed normal hearing, no discharge, no lesions.  NOSE:  Showed no discharge, no bleeding, no ulcers.  OROPHARYNX:  Was unremarkable. The tongue is coated with whitish saliva, but no evidence of oral thrush.  No ulcers. No exudates.  EYES: Revealed  normal eyelids and conjunctivae. Pupils about 5 to 6 mm, round, equal and reactive to light.  NECK: Supple. Trachea at midline. No thyromegaly. No cervical lymphadenopathy. No masses.  HEART: Normal S1, S2. No S3, S4. No murmur. No gallop. No carotid bruits.  LUNGS: Bilateral expiratory wheezing and prolonged expiratory phase. No rales.  ABDOMEN: Is soft without tenderness. No hepatosplenomegaly. No masses. No hernias.  SKIN: No ulcers. No subcutaneous nodules.  MUSCULOSKELETAL: No joint swelling. No clubbing.  NEUROLOGIC: Cranial nerves II through XII are intact. No focal motor deficit.  PSYCHIATRY: The patient is alert and oriented x 3. Mood and affect were normal.   LABORATORY AND DIAGNOSTICS: EKG showed sinus tachycardia at rate of 130 per minute. Ventricular premature complex.  Atrial premature complexes. Incomplete right bundle branch block.   Chest x-ray showed no acute cardiopulmonary abnormalities. Heart size is upper normal. No consolidation. No effusion.   Serum glucose 159, BUN 16, creatinine 0.9, sodium 136, potassium 3.5. Normal liver function tests and liver transaminases. Total CPK 59 with normal CK-MB fraction of 1.9. Troponin is marginally elevated at 0.07. CBC showed white count of 7500, hemoglobin 14, hematocrit 42, and platelet count 144.   Arterial blood gas showed a pH of 7.39, pCO2 40, and pO2 61; that was on room air.   ASSESSMENT: 1.  Acute exacerbation of chronic obstructive pulmonary disease.  2.  Mild elevation of troponin. This is very marginal elevation, most likely secondary to increased demand.  However, we need to follow up on the troponin to see the pattern.   3.  Systemic hypertension.  4.  Anxiety and depression   PLAN: Admit the patient to the medical floor, on telemetry monitoring.  Intensify treatment with oxygen supplementation DuoNeb nebulization q. 4 hours along with IV Solu-Medrol to moderate to small dose.  Change Levaquin to intravenously. I will  follow up on troponin series q. 8 hours x 2. Add aspirin 325 mg once a day. I will avoid beta blocker at this time due to acute phase of exacerbation of chronic obstructive pulmonary disease pending further work-up of her of troponin. Continue home medications as listed above. For deep vein thrombosis prophylaxis, we will place heron subcutaneous Lovenox 40 mg once a day. The patient stated that she has a Living Will and her code status is DO NOT RESUSCITATE. This is documented in the old records as well. I discussed that with the patient.  She is confirming that and does not want any life support even for short period of time.   TIME SPENT: In evaluating this patient took more than 55 minutes including reviewing her medical records.  ____________________________ Clovis Pu. Lenore Manner, MD amd:sb D: 10/31/2012 12:24:82 ET T: 10/31/2012 07:17:18 ET JOB#: [id number]  cc: Clovis Pu. Lenore Manner, MD, <Dictator> Ellin Saba MD ELECTRONICALLY SIGNED 11/01/2012 7:51

## 2014-10-01 ENCOUNTER — Other Ambulatory Visit: Payer: Self-pay | Admitting: Family Medicine

## 2014-10-01 DIAGNOSIS — Z1231 Encounter for screening mammogram for malignant neoplasm of breast: Secondary | ICD-10-CM

## 2014-10-08 DIAGNOSIS — Z8639 Personal history of other endocrine, nutritional and metabolic disease: Secondary | ICD-10-CM | POA: Diagnosis not present

## 2014-10-08 DIAGNOSIS — J449 Chronic obstructive pulmonary disease, unspecified: Secondary | ICD-10-CM | POA: Diagnosis not present

## 2014-10-08 DIAGNOSIS — I10 Essential (primary) hypertension: Secondary | ICD-10-CM | POA: Diagnosis not present

## 2014-10-09 ENCOUNTER — Ambulatory Visit
Admission: RE | Admit: 2014-10-09 | Discharge: 2014-10-09 | Disposition: A | Discharge status: Home / Self Care | Payer: Commercial Managed Care - HMO | Source: Ambulatory Visit | Attending: Family Medicine | Admitting: Family Medicine

## 2014-10-09 ENCOUNTER — Other Ambulatory Visit: Payer: Self-pay | Admitting: Family Medicine

## 2014-10-09 DIAGNOSIS — Z1231 Encounter for screening mammogram for malignant neoplasm of breast: Secondary | ICD-10-CM | POA: Insufficient documentation

## 2014-10-09 LAB — HM MAMMOGRAPHY

## 2014-10-10 DIAGNOSIS — I10 Essential (primary) hypertension: Secondary | ICD-10-CM | POA: Insufficient documentation

## 2014-10-18 DIAGNOSIS — R05 Cough: Secondary | ICD-10-CM | POA: Diagnosis not present

## 2014-10-18 DIAGNOSIS — J449 Chronic obstructive pulmonary disease, unspecified: Secondary | ICD-10-CM | POA: Diagnosis not present

## 2014-10-18 DIAGNOSIS — Z8639 Personal history of other endocrine, nutritional and metabolic disease: Secondary | ICD-10-CM | POA: Diagnosis not present

## 2014-10-24 ENCOUNTER — Other Ambulatory Visit: Payer: Self-pay | Admitting: Gastroenterology

## 2014-10-24 DIAGNOSIS — R1310 Dysphagia, unspecified: Secondary | ICD-10-CM

## 2014-10-29 DIAGNOSIS — B351 Tinea unguium: Secondary | ICD-10-CM | POA: Diagnosis not present

## 2014-10-29 DIAGNOSIS — M79672 Pain in left foot: Secondary | ICD-10-CM | POA: Diagnosis not present

## 2014-10-29 DIAGNOSIS — M79671 Pain in right foot: Secondary | ICD-10-CM | POA: Diagnosis not present

## 2014-11-01 ENCOUNTER — Ambulatory Visit
Admission: RE | Admit: 2014-11-01 | Discharge: 2014-11-01 | Disposition: A | Discharge status: Home / Self Care | Payer: BLUE CROSS/BLUE SHIELD | Source: Ambulatory Visit | Attending: Gastroenterology | Admitting: Gastroenterology

## 2014-11-01 DIAGNOSIS — R1310 Dysphagia, unspecified: Secondary | ICD-10-CM | POA: Insufficient documentation

## 2014-11-01 DIAGNOSIS — K449 Diaphragmatic hernia without obstruction or gangrene: Secondary | ICD-10-CM | POA: Insufficient documentation

## 2014-11-01 DIAGNOSIS — K228 Other specified diseases of esophagus: Secondary | ICD-10-CM | POA: Diagnosis not present

## 2014-11-07 DIAGNOSIS — F39 Unspecified mood [affective] disorder: Secondary | ICD-10-CM | POA: Insufficient documentation

## 2014-11-07 DIAGNOSIS — R143 Flatulence: Secondary | ICD-10-CM | POA: Insufficient documentation

## 2014-11-07 DIAGNOSIS — G44209 Tension-type headache, unspecified, not intractable: Secondary | ICD-10-CM | POA: Insufficient documentation

## 2014-11-07 DIAGNOSIS — M545 Low back pain, unspecified: Secondary | ICD-10-CM | POA: Insufficient documentation

## 2014-11-07 DIAGNOSIS — R1084 Generalized abdominal pain: Secondary | ICD-10-CM | POA: Diagnosis not present

## 2014-11-07 DIAGNOSIS — R002 Palpitations: Secondary | ICD-10-CM | POA: Insufficient documentation

## 2014-11-07 DIAGNOSIS — E668 Other obesity: Secondary | ICD-10-CM | POA: Insufficient documentation

## 2014-11-07 DIAGNOSIS — R5383 Other fatigue: Secondary | ICD-10-CM | POA: Insufficient documentation

## 2014-11-07 DIAGNOSIS — L84 Corns and callosities: Secondary | ICD-10-CM | POA: Insufficient documentation

## 2014-11-07 DIAGNOSIS — R350 Frequency of micturition: Secondary | ICD-10-CM | POA: Insufficient documentation

## 2014-11-07 DIAGNOSIS — R209 Unspecified disturbances of skin sensation: Secondary | ICD-10-CM | POA: Insufficient documentation

## 2014-11-07 DIAGNOSIS — L309 Dermatitis, unspecified: Secondary | ICD-10-CM | POA: Insufficient documentation

## 2014-11-07 DIAGNOSIS — IMO0001 Reserved for inherently not codable concepts without codable children: Secondary | ICD-10-CM | POA: Insufficient documentation

## 2014-11-07 DIAGNOSIS — R609 Edema, unspecified: Secondary | ICD-10-CM | POA: Insufficient documentation

## 2014-11-07 DIAGNOSIS — K5730 Diverticulosis of large intestine without perforation or abscess without bleeding: Secondary | ICD-10-CM | POA: Insufficient documentation

## 2014-11-07 DIAGNOSIS — R0989 Other specified symptoms and signs involving the circulatory and respiratory systems: Secondary | ICD-10-CM | POA: Insufficient documentation

## 2014-11-07 DIAGNOSIS — M791 Myalgia, unspecified site: Secondary | ICD-10-CM | POA: Insufficient documentation

## 2014-11-07 DIAGNOSIS — N644 Mastodynia: Secondary | ICD-10-CM | POA: Insufficient documentation

## 2014-11-07 DIAGNOSIS — R253 Fasciculation: Secondary | ICD-10-CM | POA: Insufficient documentation

## 2014-11-07 DIAGNOSIS — K5900 Constipation, unspecified: Secondary | ICD-10-CM | POA: Insufficient documentation

## 2014-11-07 DIAGNOSIS — R140 Abdominal distension (gaseous): Secondary | ICD-10-CM | POA: Diagnosis not present

## 2014-11-07 DIAGNOSIS — R42 Dizziness and giddiness: Secondary | ICD-10-CM | POA: Insufficient documentation

## 2014-11-07 DIAGNOSIS — B354 Tinea corporis: Secondary | ICD-10-CM | POA: Insufficient documentation

## 2014-11-08 ENCOUNTER — Ambulatory Visit: Payer: Commercial Managed Care - HMO | Admitting: Physician Assistant

## 2014-11-08 ENCOUNTER — Other Ambulatory Visit: Payer: Self-pay | Admitting: Family Medicine

## 2014-11-08 NOTE — Telephone Encounter (Signed)
Pt stated that she missed her appt this morning with Tawanna Sat because she wasn't feeling well and over slept. Pt stated that she is no longer seeing the doctor that wrote her the RX for Benztropine Mesylate .5mg  and Trifluoperazine 5mg  because of insurance. Pt is waiting to hear back from [place] to see who they can get her with. Pt would like you to call in the meds until she gets a new appt with a specialist. Pt stated that you have done this for her before when I advised her that she would need an OV since we don't manage those RX for her. [facility]. Thanks TNP

## 2014-11-08 NOTE — Telephone Encounter (Signed)
Please call [facility] and see if she has any refills on these medications, and if not, when they were last filled. Thanks.

## 2014-11-09 ENCOUNTER — Other Ambulatory Visit: Payer: Self-pay | Admitting: Family Medicine

## 2014-11-10 ENCOUNTER — Other Ambulatory Visit: Payer: Self-pay | Admitting: Family Medicine

## 2014-11-11 ENCOUNTER — Ambulatory Visit: Payer: Commercial Managed Care - HMO | Admitting: Anesthesiology

## 2014-11-11 ENCOUNTER — Ambulatory Visit
Admission: RE | Admit: 2014-11-11 | Discharge: 2014-11-11 | Disposition: A | Discharge status: Home / Self Care | Payer: Commercial Managed Care - HMO | Source: Ambulatory Visit | Attending: Gastroenterology | Admitting: Gastroenterology

## 2014-11-11 ENCOUNTER — Encounter: Payer: Self-pay | Admitting: Anesthesiology

## 2014-11-11 ENCOUNTER — Encounter
Admission: RE | Disposition: A | Discharge status: Home / Self Care | Payer: Self-pay | Source: Ambulatory Visit | Attending: Gastroenterology

## 2014-11-11 DIAGNOSIS — K259 Gastric ulcer, unspecified as acute or chronic, without hemorrhage or perforation: Secondary | ICD-10-CM | POA: Diagnosis not present

## 2014-11-11 DIAGNOSIS — Z87891 Personal history of nicotine dependence: Secondary | ICD-10-CM | POA: Diagnosis not present

## 2014-11-11 DIAGNOSIS — Z881 Allergy status to other antibiotic agents status: Secondary | ICD-10-CM | POA: Diagnosis not present

## 2014-11-11 DIAGNOSIS — K219 Gastro-esophageal reflux disease without esophagitis: Secondary | ICD-10-CM | POA: Diagnosis not present

## 2014-11-11 DIAGNOSIS — Z886 Allergy status to analgesic agent status: Secondary | ICD-10-CM | POA: Insufficient documentation

## 2014-11-11 DIAGNOSIS — R1310 Dysphagia, unspecified: Secondary | ICD-10-CM | POA: Insufficient documentation

## 2014-11-11 DIAGNOSIS — Z90710 Acquired absence of both cervix and uterus: Secondary | ICD-10-CM | POA: Insufficient documentation

## 2014-11-11 DIAGNOSIS — Z885 Allergy status to narcotic agent status: Secondary | ICD-10-CM | POA: Diagnosis not present

## 2014-11-11 DIAGNOSIS — K2960 Other gastritis without bleeding: Secondary | ICD-10-CM | POA: Diagnosis not present

## 2014-11-11 DIAGNOSIS — Z888 Allergy status to other drugs, medicaments and biological substances status: Secondary | ICD-10-CM | POA: Diagnosis not present

## 2014-11-11 DIAGNOSIS — Z79899 Other long term (current) drug therapy: Secondary | ICD-10-CM | POA: Diagnosis not present

## 2014-11-11 DIAGNOSIS — Z882 Allergy status to sulfonamides status: Secondary | ICD-10-CM | POA: Insufficient documentation

## 2014-11-11 DIAGNOSIS — I10 Essential (primary) hypertension: Secondary | ICD-10-CM | POA: Diagnosis not present

## 2014-11-11 DIAGNOSIS — R12 Heartburn: Secondary | ICD-10-CM | POA: Diagnosis not present

## 2014-11-11 DIAGNOSIS — K449 Diaphragmatic hernia without obstruction or gangrene: Secondary | ICD-10-CM | POA: Diagnosis not present

## 2014-11-11 DIAGNOSIS — R140 Abdominal distension (gaseous): Secondary | ICD-10-CM | POA: Diagnosis not present

## 2014-11-11 DIAGNOSIS — J449 Chronic obstructive pulmonary disease, unspecified: Secondary | ICD-10-CM | POA: Insufficient documentation

## 2014-11-11 DIAGNOSIS — F329 Major depressive disorder, single episode, unspecified: Secondary | ICD-10-CM | POA: Diagnosis not present

## 2014-11-11 DIAGNOSIS — K222 Esophageal obstruction: Secondary | ICD-10-CM | POA: Diagnosis not present

## 2014-11-11 DIAGNOSIS — K3189 Other diseases of stomach and duodenum: Secondary | ICD-10-CM | POA: Diagnosis not present

## 2014-11-11 DIAGNOSIS — Z9889 Other specified postprocedural states: Secondary | ICD-10-CM | POA: Insufficient documentation

## 2014-11-11 DIAGNOSIS — Z7951 Long term (current) use of inhaled steroids: Secondary | ICD-10-CM | POA: Diagnosis not present

## 2014-11-11 DIAGNOSIS — J45909 Unspecified asthma, uncomplicated: Secondary | ICD-10-CM | POA: Diagnosis not present

## 2014-11-11 DIAGNOSIS — K2930 Chronic superficial gastritis without bleeding: Secondary | ICD-10-CM | POA: Diagnosis not present

## 2014-11-11 DIAGNOSIS — K2950 Unspecified chronic gastritis without bleeding: Secondary | ICD-10-CM | POA: Diagnosis not present

## 2014-11-11 HISTORY — DX: Depression, unspecified: F32.A

## 2014-11-11 HISTORY — DX: Essential (primary) hypertension: I10

## 2014-11-11 HISTORY — PX: ESOPHAGOGASTRODUODENOSCOPY: SHX5428

## 2014-11-11 HISTORY — DX: Chronic obstructive pulmonary disease, unspecified: J44.9

## 2014-11-11 HISTORY — DX: Major depressive disorder, single episode, unspecified: F32.9

## 2014-11-11 SURGERY — EGD (ESOPHAGOGASTRODUODENOSCOPY)
Anesthesia: General

## 2014-11-11 MED ORDER — SODIUM CHLORIDE 0.9 % IV SOLN
INTRAVENOUS | Status: DC
  Administered 2014-11-11: 1000 mL via INTRAVENOUS

## 2014-11-11 MED ORDER — LIDOCAINE HCL (CARDIAC) 20 MG/ML IV SOLN
INTRAVENOUS | Status: DC | PRN
  Administered 2014-11-11: 60 mg via INTRAVENOUS

## 2014-11-11 MED ORDER — MIDAZOLAM HCL 2 MG/2ML IJ SOLN
INTRAMUSCULAR | Status: DC | PRN
  Administered 2014-11-11: 1 mg via INTRAVENOUS

## 2014-11-11 MED ORDER — GLYCOPYRROLATE 0.2 MG/ML IJ SOLN
INTRAMUSCULAR | Status: DC | PRN
  Administered 2014-11-11: 0.1 mg via INTRAVENOUS

## 2014-11-11 MED ORDER — PROPOFOL INFUSION 10 MG/ML OPTIME
INTRAVENOUS | Status: DC | PRN
  Administered 2014-11-11: 100 ug/kg/min via INTRAVENOUS

## 2014-11-11 MED ORDER — PROPOFOL 10 MG/ML IV BOLUS
INTRAVENOUS | Status: DC | PRN
  Administered 2014-11-11: 40 mg via INTRAVENOUS

## 2014-11-11 NOTE — Telephone Encounter (Signed)
Provider already filled rx's.

## 2014-11-11 NOTE — Discharge Instructions (Signed)

## 2014-11-11 NOTE — Anesthesia Preprocedure Evaluation (Addendum)
Anesthesia Evaluation  Patient identified by MRN, date of birth, ID band Patient awake    Reviewed: Allergy & Precautions, NPO status , Patient's Chart, lab work & pertinent test results, reviewed documented beta blocker date and time   Airway Mallampati: II  TM Distance: >3 FB     Dental  (+) Upper Dentures, Partial Lower, Chipped   Pulmonary asthma , COPDformer smoker,          Cardiovascular hypertension,     Neuro/Psych  Headaches, PSYCHIATRIC DISORDERS Schizophrenia    GI/Hepatic   Endo/Other  Morbid obesity  Renal/GU      Musculoskeletal   Abdominal   Peds  Hematology   Anesthesia Other Findings Walks slowly with a cane.  Reproductive/Obstetrics                            Anesthesia Physical Anesthesia Plan  ASA: III  Anesthesia Plan: General   Post-op Pain Management:    Induction: Intravenous  Airway Management Planned: Nasal Cannula  Additional Equipment:   Intra-op Plan:   Post-operative Plan:   Informed Consent: I have reviewed the patients History and Physical, chart, labs and discussed the procedure including the risks, benefits and alternatives for the proposed anesthesia with the patient or authorized representative who has indicated his/her understanding and acceptance.     Plan Discussed with:   Anesthesia Plan Comments:         Anesthesia Quick Evaluation

## 2014-11-11 NOTE — Op Note (Signed)
[facility] Gastroenterology Patient Name: Vicki Morales Procedure Date: 11/11/2014 9:08 AM MRN: [id number] Account #: [id number] Date of Birth: 1941/05/02 Admit Type: Outpatient Age: 74 Room: [facility] ENDO ROOM 2 Gender: Female Note Status: Finalized Procedure:         Upper GI endoscopy Indications:       Dysphagia, Heartburn, Abdominal bloating Patient Profile:   This is a 74 year old female. Providers:         Gerrit Heck. Rayann Heman, MD Referring MD:      Kirstie Peri. Caryn Section, MD (Referring MD) Medicines:         Propofol per Anesthesia Complications:     No immediate complications. Procedure:         Pre-Anesthesia Assessment:                    - Prior to the procedure, a History and Physical was                     performed, and patient medications and allergies were                     reviewed. The patient is competent. The risks and benefits                     of the procedure and the sedation options and risks were                     discussed with the patient. All questions were answered                     and informed consent was obtained. Patient identification                     and proposed procedure were verified by the physician and                     the nurse in the pre-procedure area. Mental Status                     Examination: alert and oriented. Airway Examination:                     normal oropharyngeal airway and neck mobility. Respiratory                     Examination: clear to auscultation. CV Examination: RRR,                     no murmurs, no S3 or S4. Prophylactic Antibiotics: The                     patient does not require prophylactic antibiotics. Prior                     Anticoagulants: The patient has taken no previous                     anticoagulant or antiplatelet agents. ASA Grade                     Assessment: III - A patient with severe systemic disease.                     After reviewing the risks and benefits,  the patient  was                     deemed in satisfactory condition to undergo the procedure.                     The anesthesia plan was to use monitored anesthesia care                     (MAC). Immediately prior to administration of medications,                     the patient was re-assessed for adequacy to receive                     sedatives. The heart rate, respiratory rate, oxygen                     saturations, blood pressure, adequacy of pulmonary                     ventilation, and response to care were monitored                     throughout the procedure. The physical status of the                     patient was re-assessed after the procedure.                    - Prior to the procedure, a History and Physical was                     performed, and patient medications, allergies and                     sensitivities were reviewed. The patient's tolerance of                     previous anesthesia was reviewed.                    After obtaining informed consent, the endoscope was passed                     under direct vision. Throughout the procedure, the                     patient's blood pressure, pulse, and oxygen saturations                     were monitored continuously. The Endoscope was introduced                     through the mouth, and advanced to the second part of                     duodenum. The upper GI endoscopy was accomplished without                     difficulty. The patient tolerated the procedure well. Findings:      A small hiatus hernia was present.      A low-grade of narrowing Schatzki ring (acquired) was found at the       gastroesophageal junction. A TTS dilator was passed through the scope.       Dilation  with a 15-16.5-18 mm x 5.5 cm CRE balloon (to a maximum balloon       size of 18 mm) dilator was performed. (No mucsal change at 16.5 mm).       Heme was produced at 18 mm.      Three superficial gastric ulcers were found in the gastric antrum.  The       largest lesion was 3 mm in largest dimension.      The examined duodenum was normal.      Biopsies were taken with a cold forceps in the gastric body and in the       gastric antrum for Helicobacter pylori testing. Impression:        - Small hiatus hernia.                    - Low-grade of narrowing Schatzki ring. Dilated.                    - Gastric ulcers.                    - Normal examined duodenum.                    - Biopsies were taken with a cold forceps for Helicobacter                     pylori testing. Recommendation:    - Observe patient in GI recovery unit.                    - Continue present medications.                    - Stop NSAIDS ( alleve, ibuprofen,etc).                    - Increase omeprazole to 40 mg daily                    - Await pathology results.                    - Return to GI clinic.                    - The findings and recommendations were discussed with the                     patient.                    - The findings and recommendations were discussed with the                     patient's family. Procedure Code(s): --- Professional ---                    [phone], Esophagogastroduodenoscopy, flexible, transoral;                     with transendoscopic balloon dilation of esophagus (less                     than 30 mm diameter)                    43239, Esophagogastroduodenoscopy, flexible, transoral;  with biopsy, single or multiple CPT copyright 2014 American Medical Association. All rights reserved. The codes documented in this report are preliminary and upon coder review may  be revised to meet current compliance requirements. Mellody Life, MD 11/11/2014 9:31:39 AM This report has been signed electronically. Number of Addenda: 0 Note Initiated On: 11/11/2014 9:08 AM      [facility]

## 2014-11-11 NOTE — Transfer of Care (Signed)
Immediate Anesthesia Transfer of Care Note  Patient: Vicki Morales  Procedure(s) Performed: Procedure(s): ESOPHAGOGASTRODUODENOSCOPY (EGD) (N/A)  Patient Location: Endoscopy Unit  Anesthesia Type:General  Level of Consciousness: awake, alert , oriented and patient cooperative  Airway & Oxygen Therapy: Patient Spontanous Breathing and Patient connected to nasal cannula oxygen  Post-op Assessment: Report given to RN, Post -op Vital signs reviewed and stable and Patient moving all extremities X 4  Post vital signs: Reviewed and stable  Last Vitals:  Filed Vitals:   11/11/14 0937  BP: 143/69  Pulse: 82  Temp:   Resp: 19    Complications: No apparent anesthesia complications

## 2014-11-11 NOTE — Anesthesia Postprocedure Evaluation (Signed)
  Anesthesia Post-op Note  Patient: Vicki Morales  Procedure(s) Performed: Procedure(s): ESOPHAGOGASTRODUODENOSCOPY (EGD) (N/A)  Anesthesia type:General  Patient location: PACU  Post pain: Pain level controlled  Post assessment: Post-op Vital signs reviewed, Patient's Cardiovascular Status Stable, Respiratory Function Stable, Patent Airway and No signs of Nausea or vomiting  Post vital signs: Reviewed and stable  Last Vitals:  Filed Vitals:   11/11/14 1010  BP: 211/79  Pulse: 80  Temp:   Resp: 14    Level of consciousness: awake, alert  and patient cooperative  Complications: No apparent anesthesia complications

## 2014-11-11 NOTE — H&P (Signed)
Primary Care Physician:  Lelon Huh, MD  Pre-Procedure History & Physical: HPI:  Vicki Morales is a 74 y.o. female is here for an endoscopy.   Past Medical History  Diagnosis Date  . COPD (chronic obstructive pulmonary disease)   . Hypertension   . Depression     Past Surgical History  Procedure Laterality Date  . Abdominal hysterectomy  1988  . Appendectomy  1988  . Tonsillectomy and adenoidectomy    . Trigger finger release  2004    Prior to Admission medications   Medication Sig Start Date End Date Taking? Authorizing Provider  albuterol (PROVENTIL) (2.5 MG/3ML) 0.083% nebulizer solution 2.5 mg 2 (two) times daily.    Historical Provider, MD  amLODipine (NORVASC) 5 MG tablet Take 1 tablet by mouth daily.    Historical Provider, MD  benztropine (COGENTIN) 0.5 MG tablet TAKE 1/2 TO 1 TABLET BY MOUTH TWICE DAILY 11/11/14   Birdie Sons, MD  Butalbital-APAP-Caffeine [phone] MG per capsule Take 1 capsule by mouth every 4 (four) hours as needed.    Historical Provider, MD  Calcium Carbonate-Vitamin D 600-400 MG-UNIT per tablet Take 1 tablet by mouth 2 (two) times daily.    Historical Provider, MD  clonazePAM (KLONOPIN) 0.5 MG tablet Take 0.5 tablets by mouth at bedtime.    Historical Provider, MD  clotrimazole-betamethasone (LOTRISONE) cream 1 application 2 (two) times daily. 07/09/14   Historical Provider, MD  Cranberry 250 MG CAPS Take 1 capsule by mouth 2 (two) times daily.    Historical Provider, MD  fluticasone (FLONASE) 50 MCG/ACT nasal spray Place 2 sprays into the nose daily.    Historical Provider, MD  Fluticasone-Salmeterol (ADVAIR) 250-50 MCG/DOSE AEPB Inhale 1 puff into the lungs every 12 (twelve) hours. 04/01/14 04/01/15  Historical Provider, MD  lansoprazole (PREVACID) 15 MG capsule Take 1 capsule by mouth daily.    Historical Provider, MD  Loratadine 10 MG CAPS Take 1 tablet by mouth daily.    Historical Provider, MD  Magnesium 250 MG TABS Take 1 tablet by mouth  daily.    Historical Provider, MD  Magnesium Citrate 100 MG TABS Take 1 tablet by mouth 2 (two) times daily.    Historical Provider, MD  montelukast (SINGULAIR) 10 MG tablet Take 1 tablet by mouth daily.    Historical Provider, MD  Multiple Vitamin (MULTI-VITAMINS) TABS Take 1 tablet by mouth daily.    Historical Provider, MD  omeprazole (PRILOSEC OTC) 20 MG tablet Take 1 tablet by mouth daily.    Historical Provider, MD  omeprazole (PRILOSEC) 20 MG capsule Take 1 capsule by mouth daily.    Historical Provider, MD  oxybutynin (DITROPAN-XL) 10 MG 24 hr tablet Take 1 tablet by mouth daily.    Historical Provider, MD  Probiotic Product (ALIGN) 4 MG CAPS Take 1 capsule by mouth daily.    Historical Provider, MD  sertraline (ZOLOFT) 100 MG tablet Take 1 tablet by mouth daily.    Historical Provider, MD  theophylline (THEO-24) 200 MG 24 hr capsule Take 1 capsule by mouth 2 (two) times daily.    Historical Provider, MD  theophylline (THEO-24) 400 MG 24 hr capsule Take 1 capsule by mouth daily. 10/30/14   Historical Provider, MD  tiZANidine (ZANAFLEX) 4 MG tablet Take 1 tablet by mouth daily.    Historical Provider, MD  traMADol (ULTRAM) 50 MG tablet Take 1 tablet by mouth daily.    Historical Provider, MD  traZODone (DESYREL) 100 MG tablet Take 1 tablet by mouth at  bedtime.    Historical Provider, MD  trifluoperazine (STELAZINE) 10 MG tablet TAKE 1 TABLET BY MOUTH EVERY NIGHT AT BEDTIME 11/11/14   Birdie Sons, MD  trifluoperazine (STELAZINE) 5 MG tablet Take 1 tablet by mouth daily.    Historical Provider, MD    Allergies as of 11/07/2014 - never reviewed  Allergen Reaction Noted  . Acetaminophen Other (See Comments) 11/07/2014  . Arthrotec  [diclofenac-misoprostol]  11/07/2014  . Codeine  11/07/2014  . Hydrochlorothiazide  11/07/2014  . Morphine  11/07/2014  . Penicillin v potassium Other (See Comments) 11/07/2014  . Sulfa antibiotics  11/07/2014  . Tiotropium bromide monohydrate  11/07/2014     Family History  Problem Relation Age of Onset  . Breast cancer Sister   . Stroke Father   . Emphysema Father     History   Social History  . Marital Status: Married    Spouse Name: N/A  . Number of Children: N/A  . Years of Education: N/A   Occupational History  . Not on file.   Social History Main Topics  . Smoking status: Former Smoker -- 30 years  . Smokeless tobacco: Not on file     Comment: quit in 1990's  . Alcohol Use: No  . Drug Use: No  . Sexual Activity: Not on file   Other Topics Concern  . Not on file   Social History Narrative     Physical Exam: BP 179/76 mmHg  Pulse 65  Temp(Src) 97.3 F (36.3 C) (Tympanic)  Resp 18  Ht 5\' 3"  (1.6 m)  Wt 234 lb (106.142 kg)  BMI 41.46 kg/m2  SpO2 98% General:   Alert,  pleasant and cooperative in NAD Head:  Normocephalic and atraumatic. Neck:  Supple; no masses or thyromegaly. Lungs:  Clear throughout to auscultation.    Heart:  Regular rate and rhythm. Abdomen:  Soft, nontender and nondistended. Normal bowel sounds, without guarding, and without rebound.   Neurologic:  Alert and  oriented x4;  grossly normal neurologically.  Impression/Plan: Vicki Morales is here for an endoscopy to be performed for dysphagia, GERD, bloating, HH  Risks, benefits, limitations, and alternatives regarding  endoscopy have been reviewed with the patient.  Questions have been answered.  All parties agreeable.   Josefine Class, MD  11/11/2014, 9:07 AM

## 2014-11-12 LAB — SURGICAL PATHOLOGY

## 2014-11-14 ENCOUNTER — Encounter: Payer: Self-pay | Admitting: Gastroenterology

## 2014-12-02 ENCOUNTER — Ambulatory Visit: Payer: Self-pay | Admitting: Family Medicine

## 2014-12-11 ENCOUNTER — Other Ambulatory Visit: Payer: Self-pay | Admitting: *Deleted

## 2014-12-11 MED ORDER — LORATADINE 10 MG PO CAPS: 10.0000 mg | ORAL_CAPSULE | Freq: Every day | ORAL | Status: DC

## 2014-12-11 NOTE — Telephone Encounter (Signed)
Refill request for Loratadine 10 mg Last filled by MD on- 04/15/2014 #30 x6 Last Appt: 08/22/2014 Next Appt: none Please advise refill?

## 2015-01-14 ENCOUNTER — Encounter: Payer: Self-pay | Admitting: Family Medicine

## 2015-01-14 ENCOUNTER — Ambulatory Visit (INDEPENDENT_AMBULATORY_CARE_PROVIDER_SITE_OTHER): Payer: Commercial Managed Care - HMO | Admitting: Family Medicine

## 2015-01-14 VITALS — BP 120/68 | HR 75 | Temp 98.0°F | Resp 16 | Wt 244.0 lb

## 2015-01-14 DIAGNOSIS — L918 Other hypertrophic disorders of the skin: Secondary | ICD-10-CM | POA: Diagnosis not present

## 2015-01-14 DIAGNOSIS — H6121 Impacted cerumen, right ear: Secondary | ICD-10-CM | POA: Diagnosis not present

## 2015-01-14 DIAGNOSIS — L853 Xerosis cutis: Secondary | ICD-10-CM | POA: Diagnosis not present

## 2015-01-14 NOTE — Progress Notes (Signed)
Patient: Vicki Morales Female    DOB: 06-Apr-1941   74 y.o.   MRN: [id number] Visit Date: 01/14/2015  Today's Provider: Lelon Huh, MD   Chief Complaint  Patient presents with  . Ear Fullness  . Nevus   Subjective:    HPI  Patient has mole on back, left lower side that itches all the time. She was previously followed by Dr. Koleen Nimrod and would like referral to a new dermatologist.    Patient has ear fullness, some pain in ears.    Allergies  Allergen Reactions  . Acetaminophen Other (See Comments)  . Arthrotec  [Diclofenac-Misoprostol]   . Codeine   . Hydrochlorothiazide     urinary incontinence  . Morphine   . Penicillin V Potassium Other (See Comments)  . Sulfa Antibiotics   . Tiotropium Bromide Monohydrate     Blurry vision  . Pantoprazole Rash   Previous Medications   ALBUTEROL (PROVENTIL) (2.5 MG/3ML) 0.083% NEBULIZER SOLUTION    2.5 mg 2 (two) times daily.   AMLODIPINE (NORVASC) 5 MG TABLET    Take 1 tablet by mouth daily.   B COMPLEX VITAMINS (VITAMIN B COMPLEX PO)    Take 1 tablet by mouth daily.   BENZTROPINE (COGENTIN) 0.5 MG TABLET    TAKE 1/2 TO 1 TABLET BY MOUTH TWICE DAILY   BUTALBITAL-APAP-CAFFEINE 50-325-40 MG PER CAPSULE    Take 1 capsule by mouth every 4 (four) hours as needed.   CALCIUM CARBONATE-VITAMIN D 600-400 MG-UNIT PER TABLET    Take 1 tablet by mouth 2 (two) times daily.   CLONAZEPAM (KLONOPIN) 0.5 MG TABLET    Take 0.5 tablets by mouth at bedtime.   CLOTRIMAZOLE-BETAMETHASONE (LOTRISONE) CREAM    1 application 2 (two) times daily.   CRANBERRY 250 MG CAPS    Take 1 capsule by mouth 2 (two) times daily.   FLUTICASONE (FLONASE) 50 MCG/ACT NASAL SPRAY    Place 2 sprays into the nose daily.   FLUTICASONE-SALMETEROL (ADVAIR) 250-50 MCG/DOSE AEPB    Inhale 1 puff into the lungs every 12 (twelve) hours.   LORATADINE 10 MG CAPS    Take 1 capsule (10 mg total) by mouth daily.   MAGNESIUM 250 MG TABS    Take 1 tablet by mouth at bedtime.    MISC NATURAL PRODUCTS (OSTEO BI-FLEX ADV JOINT SHIELD PO)    Take 1 tablet by mouth 2 (two) times daily.   MONTELUKAST (SINGULAIR) 10 MG TABLET    Take 1 tablet by mouth daily.   MULTIPLE VITAMIN (MULTI-VITAMINS) TABS    Take 1 tablet by mouth daily.   OMEPRAZOLE (PRILOSEC OTC) 20 MG TABLET    Take 1 tablet by mouth daily.   OXYBUTYNIN (DITROPAN-XL) 10 MG 24 HR TABLET    Take 1 tablet by mouth daily.   PROBIOTIC PRODUCT (ALIGN) 4 MG CAPS    Take 1 capsule by mouth daily.   RANITIDINE (ZANTAC) 150 MG CAPSULE    Take 150 mg by mouth every evening.   SERTRALINE (ZOLOFT) 100 MG TABLET    Take 1 tablet by mouth daily.   THEOPHYLLINE (THEO-24) 400 MG 24 HR CAPSULE    Take 1 capsule by mouth daily.   TIZANIDINE (ZANAFLEX) 4 MG TABLET    Take 1 tablet by mouth daily.   TRAMADOL (ULTRAM) 50 MG TABLET    Take 1 tablet by mouth daily.   TRAZODONE (DESYREL) 100 MG TABLET    Take 1 tablet by  mouth at bedtime.   TRIFLUOPERAZINE (STELAZINE) 10 MG TABLET    TAKE 1 TABLET BY MOUTH EVERY NIGHT AT BEDTIME    Review of Systems  HENT: Positive for ear discharge and ear pain.     History  Substance Use Topics  . Smoking status: Former Smoker -- 30 years  . Smokeless tobacco: Not on file     Comment: quit in 1990's  . Alcohol Use: No   Objective:   BP 120/68 mmHg  Pulse 75  Temp(Src) 98 F (36.7 C) (Oral)  Resp 16  Wt 244 lb (110.678 kg)  SpO2 97%  Physical Exam  General Appearance:    Alert, cooperative, no distress  Eyes:    PERRL, conjunctiva/corneas clear, EOM's intact       Lungs:     Clear to auscultation bilaterally, respirations unlabored  Heart:    Regular rate and rhythm  Neurologic:   Awake, alert, oriented x 3. No apparent focal neurological           defect.   Ears:   Right ear canal obstructed. Left clear. TM unremarkable.   Skin:   Large skin take right mid back. Generalized dry flaking across lower back.         Assessment & Plan:     1. Cerumen impaction, right Right ear  canal irrigated by April Mitchell, CMA until clear. Patient tolerated well and reported improvement in symptoms.   2. Skin tag   3. Xerosis of skin  She was formerly seen by Dr. Koleen Nimrod and requests referral to a new dermatologist.        Lelon Huh, MD  [facility]

## 2015-02-03 ENCOUNTER — Telehealth: Payer: Self-pay | Admitting: Family Medicine

## 2015-02-03 DIAGNOSIS — R109 Unspecified abdominal pain: Secondary | ICD-10-CM

## 2015-02-03 NOTE — Telephone Encounter (Signed)
Called and spoke with patient. She states she is requesting a referral to GI because all her life she has been having problems with having excessive gas (passing gas). Patient states she has tried taking something over the counter and have even tried something that she purchased on line (patient is unsure what the names of these products are). She reports that neither helped with the passing gas. Patient states the flatulence is occuring more frequently to the point where she cannot control it. Patient states she has not mentioned this to Dr. Caryn Section before because she is always being seen in the office for something else. Patient denies any abdominal pain, change in bowel habits or abnormal stools. Patient also wanted to mention that she was born 1 month early and doesn't know if her intestines were fully developed. She thinks this could have something to do with having excessive gas.

## 2015-02-03 NOTE — Telephone Encounter (Signed)
Please refer to GI as requested by patient below.

## 2015-02-03 NOTE — Telephone Encounter (Signed)
Pt called wanting an appt with the GI doctor, she has been having a lot of gas.   She has [employer] ins.  Please call her back regarding the appt.  [phone]  Hoyt Koch

## 2015-02-18 ENCOUNTER — Ambulatory Visit: Payer: Self-pay | Admitting: Family Medicine

## 2015-02-20 DIAGNOSIS — L820 Inflamed seborrheic keratosis: Secondary | ICD-10-CM | POA: Diagnosis not present

## 2015-02-20 DIAGNOSIS — B354 Tinea corporis: Secondary | ICD-10-CM | POA: Diagnosis not present

## 2015-02-20 DIAGNOSIS — L538 Other specified erythematous conditions: Secondary | ICD-10-CM | POA: Diagnosis not present

## 2015-02-20 DIAGNOSIS — L821 Other seborrheic keratosis: Secondary | ICD-10-CM | POA: Diagnosis not present

## 2015-03-18 ENCOUNTER — Ambulatory Visit (INDEPENDENT_AMBULATORY_CARE_PROVIDER_SITE_OTHER): Payer: Commercial Managed Care - HMO | Admitting: Family Medicine

## 2015-03-18 ENCOUNTER — Encounter: Payer: Self-pay | Admitting: Family Medicine

## 2015-03-18 VITALS — BP 144/74 | HR 77 | Temp 97.8°F | Resp 20 | Wt 245.0 lb

## 2015-03-18 DIAGNOSIS — Z23 Encounter for immunization: Secondary | ICD-10-CM

## 2015-03-18 DIAGNOSIS — F209 Schizophrenia, unspecified: Secondary | ICD-10-CM | POA: Diagnosis not present

## 2015-03-18 DIAGNOSIS — F39 Unspecified mood [affective] disorder: Secondary | ICD-10-CM | POA: Diagnosis not present

## 2015-03-18 NOTE — Progress Notes (Signed)
Patient: Vicki Morales Female    DOB: 10-16-1940   74 y.o.   MRN: [id number] Visit Date: 03/18/2015  Today's Provider: Lelon Huh, MD   Chief Complaint  Patient presents with  . Depression    follow up   Subjective:    HPI  Schizophrenia, depression.  Patient comes in today stating that her Psychiatrist Dr. Bridgett Larsson is retiring and she needs to find a new Psychiatrst that is in her network. She was had psychiatric hospital admission in the 1980s, but had been stable on medications nearly the entire time since. She has been on her current medications for many years and would like to have me refill them until she can get established with a new psychiatrist.      Allergies  Allergen Reactions  . Acetaminophen Other (See Comments)  . Arthrotec  [Diclofenac-Misoprostol]   . Codeine   . Hydrochlorothiazide     urinary incontinence  . Morphine   . Penicillin V Potassium Other (See Comments)  . Sulfa Antibiotics   . Tiotropium Bromide Monohydrate     Blurry vision  . Pantoprazole Rash   Previous Medications   ALBUTEROL (PROVENTIL) (2.5 MG/3ML) 0.083% NEBULIZER SOLUTION    2.5 mg 2 (two) times daily.   AMLODIPINE (NORVASC) 5 MG TABLET    Take 1 tablet by mouth daily.   B COMPLEX VITAMINS (VITAMIN B COMPLEX PO)    Take 1 tablet by mouth daily.   BENZTROPINE (COGENTIN) 0.5 MG TABLET    TAKE 1/2 TO 1 TABLET BY MOUTH TWICE DAILY   BUTALBITAL-APAP-CAFFEINE 50-325-40 MG PER CAPSULE    Take 1 capsule by mouth every 4 (four) hours as needed.   CALCIUM CARBONATE-VITAMIN D 600-400 MG-UNIT PER TABLET    Take 1 tablet by mouth 2 (two) times daily.   CLONAZEPAM (KLONOPIN) 0.5 MG TABLET    Take 0.5 tablets by mouth at bedtime.   CLOTRIMAZOLE-BETAMETHASONE (LOTRISONE) CREAM    1 application 2 (two) times daily.   CRANBERRY 250 MG CAPS    Take 1 capsule by mouth 2 (two) times daily.   FLUTICASONE (FLONASE) 50 MCG/ACT NASAL SPRAY    Place 2 sprays into the nose daily.   FLUTICASONE-SALMETEROL (ADVAIR) 250-50 MCG/DOSE AEPB    Inhale 1 puff into the lungs every 12 (twelve) hours.   LORATADINE 10 MG CAPS    Take 1 capsule (10 mg total) by mouth daily.   MAGNESIUM 250 MG TABS    Take 1 tablet by mouth at bedtime.    MISC NATURAL PRODUCTS (OSTEO BI-FLEX ADV JOINT SHIELD PO)    Take 1 tablet by mouth 2 (two) times daily.   MONTELUKAST (SINGULAIR) 10 MG TABLET    Take 1 tablet by mouth daily.   MULTIPLE VITAMIN (MULTI-VITAMINS) TABS    Take 1 tablet by mouth daily.   OMEPRAZOLE (PRILOSEC OTC) 20 MG TABLET    Take 1 tablet by mouth daily.   OXYBUTYNIN (DITROPAN-XL) 10 MG 24 HR TABLET    Take 1 tablet by mouth daily.   PROBIOTIC PRODUCT (ALIGN) 4 MG CAPS    Take 1 capsule by mouth daily.   RANITIDINE (ZANTAC) 150 MG CAPSULE    Take 150 mg by mouth every evening.   SERTRALINE (ZOLOFT) 100 MG TABLET    Take 1 tablet by mouth daily.   THEOPHYLLINE (THEO-24) 400 MG 24 HR CAPSULE    Take 1 capsule by mouth daily.   TIZANIDINE (ZANAFLEX) 4 MG TABLET  Take 1 tablet by mouth daily.   TRAMADOL (ULTRAM) 50 MG TABLET    Take 1 tablet by mouth daily.   TRAZODONE (DESYREL) 100 MG TABLET    Take 1 tablet by mouth at bedtime.   TRIFLUOPERAZINE (STELAZINE) 10 MG TABLET    TAKE 1 TABLET BY MOUTH EVERY NIGHT AT BEDTIME    Review of Systems  Constitutional: Negative for fever, chills, appetite change and fatigue.  Respiratory: Negative for chest tightness and shortness of breath.   Cardiovascular: Negative for chest pain and palpitations.  Gastrointestinal: Negative for nausea, vomiting and abdominal pain.  Neurological: Negative for dizziness and weakness.  Psychiatric/Behavioral: Negative for behavioral problems, confusion, dysphoric mood and agitation. The patient is nervous/anxious.     Social History  Substance Use Topics  . Smoking status: Former Smoker -- 30 years  . Smokeless tobacco: Not on file     Comment: quit in 1990's  . Alcohol Use: No   Objective:   BP  144/74 mmHg  Pulse 77  Temp(Src) 97.8 F (36.6 C) (Oral)  Resp 20  Wt 245 lb (111.131 kg)  SpO2 94%  Physical Exam  General appearance: alert, well developed, obese, cooperative and in no distress Head: Normocephalic, without obvious abnormality, atraumatic Lungs: Respirations even and unlabored Extremities: No gross deformities Skin: Skin color, texture, turgor normal. No rashes seen  Psych: Appropriate mood and affect. Neurologic: Mental status: Alert, oriented to person, place, and time, thought content appropriate.     Assessment & Plan:     1. Schizophrenia, in remission ([place]) Stable for many years on current medication regiment. Needs to establish with new in-network psychiatrist since Dr. Bridgett Larsson will be leaving.  - Ambulatory referral to Psychiatry  2. Episodic mood disorder ([place])  - Ambulatory referral to Psychiatry  3. Need for influenza vaccination  - Flu vaccine HIGH DOSE PF       Lelon Huh, MD  [facility]

## 2015-03-27 ENCOUNTER — Telehealth: Payer: Self-pay | Admitting: Family Medicine

## 2015-03-27 NOTE — Telephone Encounter (Signed)
Appointment made with Royal Piedra (therapist at [facility] 04-14-15 at 2:00).Appointment with Dr Jimmye Norman 04-21-15 at 9:00

## 2015-04-14 ENCOUNTER — Ambulatory Visit (INDEPENDENT_AMBULATORY_CARE_PROVIDER_SITE_OTHER): Payer: Commercial Managed Care - HMO | Admitting: Licensed Clinical Social Worker

## 2015-04-14 DIAGNOSIS — F209 Schizophrenia, unspecified: Secondary | ICD-10-CM | POA: Diagnosis not present
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
## 2015-04-16 ENCOUNTER — Ambulatory Visit (INDEPENDENT_AMBULATORY_CARE_PROVIDER_SITE_OTHER): Payer: Commercial Managed Care - HMO | Admitting: Family Medicine

## 2015-04-16 ENCOUNTER — Telehealth: Payer: Self-pay | Admitting: *Deleted

## 2015-04-16 ENCOUNTER — Other Ambulatory Visit
Admission: RE | Admit: 2015-04-16 | Discharge: 2015-04-16 | Disposition: A | Discharge status: Home / Self Care | Payer: BLUE CROSS/BLUE SHIELD | Source: Ambulatory Visit | Attending: Family Medicine | Admitting: Family Medicine

## 2015-04-16 ENCOUNTER — Encounter: Payer: Self-pay | Admitting: Family Medicine

## 2015-04-16 VITALS — BP 130/66 | HR 59 | Temp 97.8°F | Resp 16 | Wt 244.0 lb

## 2015-04-16 DIAGNOSIS — R300 Dysuria: Secondary | ICD-10-CM | POA: Diagnosis not present

## 2015-04-16 DIAGNOSIS — Z029 Encounter for administrative examinations, unspecified: Secondary | ICD-10-CM | POA: Diagnosis not present

## 2015-04-16 DIAGNOSIS — M5442 Lumbago with sciatica, left side: Secondary | ICD-10-CM | POA: Diagnosis not present

## 2015-04-16 DIAGNOSIS — R1012 Left upper quadrant pain: Secondary | ICD-10-CM

## 2015-04-16 DIAGNOSIS — N390 Urinary tract infection, site not specified: Secondary | ICD-10-CM

## 2015-04-16 LAB — CBC
HEMATOCRIT: 45.7 % (ref 35.0–47.0)
HEMOGLOBIN: 14.9 g/dL (ref 12.0–16.0)
MCH: 30.4 pg (ref 26.0–34.0)
MCHC: 32.7 g/dL (ref 32.0–36.0)
MCV: 93 fL (ref 80.0–100.0)
Platelets: 164 10*3/uL (ref 150–440)
RBC: 4.91 MIL/uL (ref 3.80–5.20)
RDW: 14.4 % (ref 11.5–14.5)
WBC: 4.7 10*3/uL (ref 3.6–11.0)

## 2015-04-16 LAB — COMPREHENSIVE METABOLIC PANEL
ALBUMIN: 4 g/dL (ref 3.5–5.0)
ALK PHOS: 77 U/L (ref 38–126)
ALT: 17 U/L (ref 14–54)
AST: 21 U/L (ref 15–41)
Anion gap: 8 (ref 5–15)
BILIRUBIN TOTAL: 0.8 mg/dL (ref 0.3–1.2)
BUN: 12 mg/dL (ref 6–20)
CALCIUM: 9.6 mg/dL (ref 8.9–10.3)
CO2: 28 mmol/L (ref 22–32)
Chloride: 103 mmol/L (ref 101–111)
Creatinine, Ser: 0.84 mg/dL (ref 0.44–1.00)
GFR calc Af Amer: >60 mL/min (ref >60)
GFR calc non Af Amer: >60 mL/min (ref >60)
GLUCOSE: 97 mg/dL (ref 65–99)
POTASSIUM: 4 mmol/L (ref 3.5–5.1)
SODIUM: 139 mmol/L (ref 135–145)
TOTAL PROTEIN: 6.4 g/dL — AB (ref 6.5–8.1)

## 2015-04-16 LAB — POCT URINALYSIS DIPSTICK
Bilirubin, UA: NEGATIVE
Glucose, UA: NEGATIVE
Ketones, UA: NEGATIVE
Nitrite, UA: NEGATIVE
PROTEIN UA: NEGATIVE
Spec Grav, UA: 1.01
UROBILINOGEN UA: 0.2
pH, UA: 8

## 2015-04-16 LAB — AMYLASE: AMYLASE: 59 U/L (ref 28–100)

## 2015-04-16 MED ORDER — CIPROFLOXACIN HCL 500 MG PO TABS: 500.0000 mg | ORAL_TABLET | Freq: Two times a day (BID) | ORAL | Status: AC

## 2015-04-16 MED ORDER — RANITIDINE HCL 150 MG PO CAPS: 150.0000 mg | ORAL_CAPSULE | Freq: Every evening | ORAL | Status: DC

## 2015-04-16 NOTE — Progress Notes (Signed)
Patient: Vicki Morales Female    DOB: Feb 12, 1941   74 y.o.   MRN: [id number] Visit Date: 04/16/2015  Today's Provider: Lelon Huh, MD   Chief Complaint  Patient presents with  . Back Pain    x 3 days   Subjective:    Back Pain This is a new problem. Episode onset: started 3 days ago. The problem occurs constantly. The problem has been gradually worsening since onset. The pain is present in the lumbar spine. The quality of the pain is described as aching. Radiates to: left leg down to her ankle. The pain is moderate. The symptoms are aggravated by bending. Associated symptoms include abdominal pain (upper left quadrant), dysuria (burning pain after urinating) and leg pain. Pertinent negatives include no chest pain, fever, pelvic pain, perianal numbness, tingling, weakness or weight loss.   She states that she had been on ranitidine for gastritis but ran out a few weeks ago. She states LUQ pain is constant and associated with bloating, although not affecting by eating. Has taken no OTC medications for this.      Allergies  Allergen Reactions  . Acetaminophen Other (See Comments)  . Arthrotec  [Diclofenac-Misoprostol]   . Codeine   . Hydrochlorothiazide     urinary incontinence  . Morphine   . Penicillin V Potassium Other (See Comments)  . Sulfa Antibiotics   . Tiotropium Bromide Monohydrate     Blurry vision  . Pantoprazole Rash   Previous Medications   ALBUTEROL (PROVENTIL) (2.5 MG/3ML) 0.083% NEBULIZER SOLUTION    2.5 mg 2 (two) times daily.   AMLODIPINE (NORVASC) 5 MG TABLET    Take 1 tablet by mouth daily.   B COMPLEX VITAMINS (VITAMIN B COMPLEX PO)    Take 1 tablet by mouth daily.   BENZTROPINE (COGENTIN) 0.5 MG TABLET    TAKE 1/2 TO 1 TABLET BY MOUTH TWICE DAILY   BUTALBITAL-APAP-CAFFEINE 50-325-40 MG PER CAPSULE    Take 1 capsule by mouth every 4 (four) hours as needed.   CALCIUM CARBONATE-VITAMIN D 600-400 MG-UNIT PER TABLET    Take 1 tablet by mouth 2 (two)  times daily.   CLONAZEPAM (KLONOPIN) 0.5 MG TABLET    Take 0.5 tablets by mouth at bedtime.   CLOTRIMAZOLE-BETAMETHASONE (LOTRISONE) CREAM    1 application 2 (two) times daily.   CRANBERRY 250 MG CAPS    Take 1 capsule by mouth 2 (two) times daily.   FLUTICASONE (FLONASE) 50 MCG/ACT NASAL SPRAY    Place 2 sprays into the nose daily.   FLUTICASONE-SALMETEROL (ADVAIR) 250-50 MCG/DOSE AEPB    Inhale 1 puff into the lungs every 12 (twelve) hours.   LORATADINE 10 MG CAPS    Take 1 capsule (10 mg total) by mouth daily.   MAGNESIUM 250 MG TABS    Take 1 tablet by mouth at bedtime.    MISC NATURAL PRODUCTS (OSTEO BI-FLEX ADV JOINT SHIELD PO)    Take 1 tablet by mouth 2 (two) times daily.   MONTELUKAST (SINGULAIR) 10 MG TABLET    Take 1 tablet by mouth daily.   MULTIPLE VITAMIN (MULTI-VITAMINS) TABS    Take 1 tablet by mouth daily.   OMEPRAZOLE (PRILOSEC OTC) 20 MG TABLET    Take 1 tablet by mouth daily.   OXYBUTYNIN (DITROPAN-XL) 10 MG 24 HR TABLET    Take 1 tablet by mouth daily.   PROBIOTIC PRODUCT (ALIGN) 4 MG CAPS    Take 1 capsule by mouth  daily.   RANITIDINE (ZANTAC) 150 MG CAPSULE    Take 150 mg by mouth every evening.   SERTRALINE (ZOLOFT) 100 MG TABLET    Take 1 tablet by mouth daily.   THEOPHYLLINE (THEO-24) 400 MG 24 HR CAPSULE    Take 1 capsule by mouth daily.   TIZANIDINE (ZANAFLEX) 4 MG TABLET    Take 1 tablet by mouth daily.   TRAMADOL (ULTRAM) 50 MG TABLET    Take 1 tablet by mouth daily.   TRAZODONE (DESYREL) 100 MG TABLET    Take 1 tablet by mouth at bedtime.   TRIFLUOPERAZINE (STELAZINE) 10 MG TABLET    TAKE 1 TABLET BY MOUTH EVERY NIGHT AT BEDTIME    Review of Systems  Constitutional: Negative for fever, chills, weight loss, appetite change and fatigue.  Respiratory: Negative for chest tightness and shortness of breath.   Cardiovascular: Negative for chest pain and palpitations.  Gastrointestinal: Positive for vomiting (Once last night), abdominal pain (upper left quadrant) and  abdominal distention. Negative for nausea, diarrhea, constipation and blood in stool.  Genitourinary: Positive for dysuria (burning pain after urinating) and frequency. Negative for pelvic pain.  Musculoskeletal: Positive for myalgias and back pain.  Neurological: Negative for dizziness, tingling and weakness.    Social History  Substance Use Topics  . Smoking status: Former Smoker -- 30 years  . Smokeless tobacco: Not on file     Comment: quit in 1990's  . Alcohol Use: No   Objective:   BP 130/66 mmHg  Pulse 59  Temp(Src) 97.8 F (36.6 C) (Oral)  Resp 16  Wt 244 lb (110.678 kg)  SpO2 96%  Physical Exam  General appearance: alert, well developed, well nourished, cooperative and in no distress Head: Normocephalic, without obvious abnormality, atraumatic Lungs: Respirations even and unlabored Extremities: No gross deformities Skin: Skin color, texture, turgor normal. No rashes seen  Psych: Appropriate mood and affect. Neurologic: Mental status: Alert, oriented to person, place, and time, thought content appropriate. GI: Tender LUQ, left flank, and lumbar spine. No rebound or guarding. No mass appreciated, but exam limited by obesity.   Results for orders placed or performed in visit on 04/16/15  POCT Urinalysis Dipstick  Result Value Ref Range   Color, UA yellow    Clarity, UA clear    Glucose, UA negative    Bilirubin, UA negative    Ketones, UA negative    Spec Grav, UA 1.010    Blood, UA trace    pH, UA 8.0    Protein, UA negative    Urobilinogen, UA 0.2    Nitrite, UA negative    Leukocytes, UA small (1+) (A) Negative        Assessment & Plan:     1. Dysuria Send urine for culture - POCT Urinalysis Dipstick - ciprofloxacin (CIPRO) 500 MG tablet; Take 1 tablet (500 mg total) by mouth 2 (two) times daily.  Dispense: 20 tablet; Refill: 0  2. Urinary tract infection without hematuria, site unspecified  - Urine Culture  3. LUQ abdominal pain Has been off of  H2 blocker for a few weeks and current pain similar to previous episodes of gastritis. Check labs and start back on raniditine - CBC - Comprehensive metabolic panel - Amylase - H. pylori antibody, IgG - ranitidine (ZANTAC) 150 MG capsule; Take 1 capsule (150 mg total) by mouth every evening.  Dispense: 90 capsule; Refill: 4  Consider imaging studies if not rapidly improving.   4. Midline low back pain  with left-sided sciatica Hold off on NSAIDs due to possible gastritis.        Lelon Huh, MD  [facility]

## 2015-04-16 NOTE — Telephone Encounter (Signed)
Received call a nurse message stating that patient had nausea and vomiting all day yesterday 03/15/2015. Patient had started vomiting immediately after taking her nightly med's oxybutynin, trazodone, benztropine, amlodipine. Patient is unsure whether she should take the med's again. Called patient, who stated that she is feeling better but still nauseous. Patient also stated that she believes she may have a uti. She has been having pain "around her kidneys". Patient has an ov appt today at 4:30 pm.

## 2015-04-17 LAB — H. PYLORI ANTIBODY, IGG: H Pylori IgG: <0.9 U/mL (ref 0.0–0.8)

## 2015-04-17 LAB — URINE CULTURE

## 2015-04-21 ENCOUNTER — Ambulatory Visit: Payer: Self-pay | Admitting: Psychiatry

## 2015-04-24 ENCOUNTER — Telehealth: Payer: Self-pay

## 2015-04-24 NOTE — Telephone Encounter (Signed)
Patient called reporting that Cipro is making her feel tired, no energy and just not feeling well. Patient reports that she is not having anymore dysuria. Patient reports she will not take her Cipro tonight. Please advise.  Patient call back number is [phone]. sd

## 2015-04-25 NOTE — Telephone Encounter (Signed)
OK to stop cipro is she is not having any dysuria.

## 2015-04-26 NOTE — Telephone Encounter (Signed)
Pt advised-aa 

## 2015-05-02 ENCOUNTER — Other Ambulatory Visit: Payer: Self-pay | Admitting: Family Medicine

## 2015-05-02 NOTE — Telephone Encounter (Signed)
Please call in zolpidem  

## 2015-05-05 ENCOUNTER — Other Ambulatory Visit: Payer: Self-pay | Admitting: *Deleted

## 2015-05-05 MED ORDER — MONTELUKAST SODIUM 10 MG PO TABS: 10.0000 mg | ORAL_TABLET | Freq: Every day | ORAL | Status: DC

## 2015-05-05 NOTE — Telephone Encounter (Signed)
Rx called in to pharmacy. 

## 2015-05-06 ENCOUNTER — Telehealth: Payer: Self-pay | Admitting: Family Medicine

## 2015-05-06 ENCOUNTER — Ambulatory Visit (INDEPENDENT_AMBULATORY_CARE_PROVIDER_SITE_OTHER): Payer: Commercial Managed Care - HMO | Admitting: Licensed Clinical Social Worker

## 2015-05-06 DIAGNOSIS — F209 Schizophrenia, unspecified: Secondary | ICD-10-CM

## 2015-05-06 NOTE — Telephone Encounter (Signed)
Patient daughter is requesting a prescription for a lift chair, because of arthitis, if gets Rx from Dr Elvin So will help with chair  Tye Maryland is daughter name cb# [phone] or [phone]

## 2015-05-07 NOTE — Telephone Encounter (Signed)
rx written and ready to pick up.

## 2015-05-08 NOTE — Progress Notes (Signed)
   THERAPIST PROGRESS NOTE  Session Time: 8min  Participation Level: Active  Behavioral Response: NeatAlertIrritable  Type of Therapy: Individual Therapy  Treatment Goals addressed: Coping  Interventions: CBT, Motivational Interviewing, Solution Focused, Supportive, Family Systems and Reframing  Summary: Vicki Morales is a 74 y.o. female who presents with continued symptoms of her diagnosis.  Patient was eager to discuss her Thanksgiving holiday and reports that she is doing well.  She states that she does not need therapy on medication to reduce symptoms.  She has lost her independence and is relying on her daughter for everything.  She reports being anxious about her upcoming surgery and wants her Primary Care Physician to order her a lift chair due to her having difficulty with her legs and getting around easily.  She was able to review her daily schedule to ensure that she is staying active.   Suicidal/Homicidal: Nowithout intent/plan  Therapist Response: LCSW provided Patient with ongoing emotional support and encouragement.  Normalized her feelings.  Commended Patient on her progress and reinforced the importance of client staying focused on her own strengths and resources and resiliency. Processed various strategies for dealing with stressors.   Plan: Return again in 2 weeks.  Diagnosis: Axis I: Schizophrenia    Axis II: No diagnosis    Lubertha South 05/08/2015

## 2015-05-08 NOTE — Telephone Encounter (Signed)
Advised patient as below.  

## 2015-05-15 ENCOUNTER — Ambulatory Visit: Payer: Commercial Managed Care - HMO | Admitting: Psychiatry

## 2015-05-15 ENCOUNTER — Encounter: Payer: Self-pay | Admitting: Psychiatry

## 2015-05-15 ENCOUNTER — Ambulatory Visit (INDEPENDENT_AMBULATORY_CARE_PROVIDER_SITE_OTHER): Payer: Commercial Managed Care - HMO | Admitting: Psychiatry

## 2015-05-15 VITALS — BP 142/90 | HR 95 | Temp 99.2°F | Ht 63.0 in | Wt 246.8 lb

## 2015-05-15 DIAGNOSIS — F209 Schizophrenia, unspecified: Secondary | ICD-10-CM | POA: Diagnosis not present

## 2015-05-15 MED ORDER — TRAZODONE HCL 50 MG PO TABS: 100.0000 mg | ORAL_TABLET | Freq: Every day | ORAL | Status: DC

## 2015-05-15 MED ORDER — TRIFLUOPERAZINE HCL 10 MG PO TABS: 10.0000 mg | ORAL_TABLET | Freq: Every day | ORAL | Status: DC

## 2015-05-15 MED ORDER — CLONAZEPAM 0.5 MG PO TABS: 1.0000 mg | ORAL_TABLET | Freq: Every day | ORAL | Status: DC

## 2015-05-15 MED ORDER — SERTRALINE HCL 100 MG PO TABS: 100.0000 mg | ORAL_TABLET | Freq: Every day | ORAL | Status: DC

## 2015-05-15 MED ORDER — BENZTROPINE MESYLATE 0.5 MG PO TABS: 0.5000 mg | ORAL_TABLET | Freq: Every day | ORAL | Status: DC

## 2015-05-15 NOTE — Progress Notes (Addendum)
Psychiatric Initial Adult Assessment   Patient Identification: DEVONA SNEIDER MRN:  [id number] Date of Evaluation:  05/15/2015 Referral Source: Self/Dr. Bridgett Larsson Chief Complaint:  "My doctor Bridgett Larsson is going out of business." Chief Complaint    Establish Care; Anxiety; Panic Attack; Depression; Stress; Fatigue     Visit Diagnosis:    ICD-9-CM ICD-10-CM   1. Schizophrenia, unspecified type ([place])  F20.9    Diagnosis:   Patient Active Problem List   Diagnosis Date Noted  . Breast pain [N64.4] 11/07/2014  . Callus of foot [L84] 11/07/2014  . Weak pulse [R09.89] 11/07/2014  . CN (constipation) [K59.00] 11/07/2014  . Dermatitis, eczematoid [L30.9] 11/07/2014  . Diverticulosis of colon [K57.30] 11/07/2014  . Dizziness [R42] 11/07/2014  . Can't get food down [R13.10] 11/07/2014  . Accumulation of fluid in tissues [R60.9] 11/07/2014  . 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 Fatigue [R53.83] 11/07/2014  . Farts [R14.3] 11/07/2014  . FOM (frequency of micturition) [R35.0] 11/07/2014  . Tension type headache [G44.209] 11/07/2014  . LBP (low back pain) [M54.5] 11/07/2014  . Episodic mood disorder ([place]) [F39] 11/07/2014  . Extreme obesity ([place]) [E66.01] 11/07/2014  . Muscle ache [M79.1] 11/07/2014  . Disturbance of skin sensation [R20.9] 11/07/2014  . Awareness of heartbeats [R00.2] 11/07/2014  . Jerking [R25.3] 11/07/2014  . Body tinea [B35.4] 11/07/2014  . Essential (primary) hypertension [I10] 10/10/2014  . H/O: obesity [Z87.898] 04/01/2014  . COPD, moderate ([place]) [J44.9] 04/01/2014  . Moderate COPD (chronic obstructive pulmonary disease) ([place]) [J44.9] 04/01/2014  . CAFL (chronic airflow limitation) ([place]) [J44.9] 01/25/2009  . Malaise and fatigue [R53.81, R53.83] 11/26/2008  . Aphasia [R47.01] 09/26/2008  . 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 Acid reflux [K21.9] 06/07/1998  . HLD (hyperlipidemia) [E78.5] 06/07/1998  . BP  (high blood pressure) [I10] 06/07/1998  . OP (osteoporosis) [M81.0] 06/07/1998  . H/O total hysterectomy [Z90.710] 03/08/1987  . Schizophrenia, in remission [facility]) [F20.9] 06/07/1978   History of Present Illness:  Patient states she first started getting psychiatric treatment in 1971. When asked about the circumstances she stated that she felt some of her friends had turned on her. She said she thought her friends were saying bad things about her. She states at that point she was started on Stelazine. She also states that she's had auditory hallucinations but she can't remember the details to those. She states she recalled having them in 1979. When asked about visual hallucination she states she did have "visions." She stated that they in the past have been a vision of a cougar who she interpreted to mean that was the [employer] representing himself as a Transport planner. She states she also had a vision of 2 spirits in her room at night. She states she also had a vision of a pharaoh in gold. She'll call the last vision occurred in 2012.  She denied any symptoms consistent with a major depressive episode. She states she has gotten depressed in the past but has always been able to work through it. She denies any symptoms of mania.  Patient states she's largely maintained on her Stelazine since 75. She states that was an attempt by her last psychiatrist try Abilify but the patient got concerned about having any side effects on it and it was stopped and she was put back on her Stelazine. Patient is somewhat satisfied with her current regimen and really does not want any changes to it.  She had been seeing Dr. Bridgett Larsson for the past  8 years and most recently was following up with him every 5 months. Elements:  Duration:  As noted above. Associated Signs/Symptoms: Depression Symptoms:  denies symptoms consistent with a major depressive episode (Hypo) Manic Symptoms:  None Anxiety Symptoms:  none Psychotic Symptoms:  As  noted above but has been stable for years PTSD Symptoms: NA  Past Medical History:  Past Medical History  Diagnosis Date  . COPD (chronic obstructive pulmonary disease) ([place])   . Hypertension   . Depression   . Anxiety     Past Surgical History  Procedure Laterality Date  . Abdominal hysterectomy  1988  . Appendectomy  1988  . Tonsillectomy and adenoidectomy    . Trigger finger release  2004  . Esophagogastroduodenoscopy N/A 11/11/2014    Procedure: ESOPHAGOGASTRODUODENOSCOPY (EGD);  Surgeon: Josefine Class, MD;  Location: [facility] END[facility]COPY;  Service: Endoscopy;  Laterality: N/A;   Family History:  Family History  Problem Relation Age of Onset  . Breast cancer Sister   . Stroke Father   . Emphysema Father   . Anxiety disorder Father   . Depression Father   . Anxiety disorder Brother    Social History:   Social History   Social History  . Marital Status: Widowed    Spouse Name: N/A  . Number of Children: N/A  . Years of Education: N/A   Social History Main Topics  . Smoking status: Former Smoker -- 30 years  . Smokeless tobacco: Never Used     Comment: quit in 1990's  . Alcohol Use: No  . Drug Use: No  . Sexual Activity: Not Currently   Other Topics Concern  . None   Social History Narrative   Additional Social History: Patient states her childhood was "pleasant." She had one brother and 3 sisters. She denies any forms of abuse. Patient is widowed. Patient currently resides with her adult daughter. She has an adult son in [place] and 2 grandchildren from her son.  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 Grades were C's & D's  Legal History:Denies   Military Experience:Denies   Religious/Spiritual Preferences:Holiness  Family/Childhood History: Born in [place], has 3 sisters and 1 brother, 2nd oldest, describes childhood "as real good, I really liked being home. I had a good childhood. Everyone was good to me"  Children/Grand-children: [name] 66, [name] 80  Natural/Informal Support: God, sister, neighbor  Musculoskeletal: Strength & Muscle Tone: within normal limits Gait & Station: Slow and walks with a cane Patient leans: N/A  Psychiatric Specialty Exam: HPI  Review of Systems  Psychiatric/Behavioral: Negative for depression, suicidal ideas, hallucinations, memory loss and substance abuse. The patient is not nervous/anxious and does not have insomnia.   All other systems reviewed and are negative.   Blood pressure 142/90, pulse 95, temperature 99.2 F (37.3 C), temperature source Tympanic, height 5\' 3"  (1.6 m), weight 246 lb 12.8 oz (111.948 kg), SpO2 95 %.Body mass index is 43.73 kg/(m^2).  General Appearance: Well Groomed  Eye Contact:  Good  Speech:  Normal Rate  Volume:  Normal  Mood:  Good  Affect:  Congruent  Thought Process:  Linear and Logical  Orientation:  Full (Time, Place, and Person)  Thought Content:  Negative  Suicidal Thoughts:  No  Homicidal Thoughts:  No  Memory:  Immediate;   Good Recent;   Good Remote;   Good  Judgement:  Good  Insight:  Good  Psychomotor Activity:  Negative  Concentration:  Good  Recall:  Good  Fund of Knowledge:Good  Language: Good  Akathisia:  Negative  Handed:    AIMS (if indicated):  Done on 05/15/2015 normal   Assets:  Communication Skills Desire for Improvement Social Support  ADL's:  Intact   Cognition: WNL  Sleep:  Good with medication   Mini-Mental Status Examination 30 out of 30. Patient did have some trouble performing serial sevens but she was able to spell the word world backwards correctly. Is the patient at risk to self?  No. Has the patient been a risk to self in the past 6 months?  No. Has the patient been a risk to self within the distant past?  Yes.   overdosed in 1979 Is the patient a risk to others?  No. Has the patient been a risk to others in the past 6 months?  No. Has the patient been a risk to others within the distant past?  No.  Allergies:   Allergies  Allergen Reactions  . Acetaminophen Other (See Comments)  . Arthrotec  [Diclofenac-Misoprostol]   . Codeine   . Diclofenac Other (See Comments)  . Hydrochlorothiazide     urinary incontinence  . Morphine   . Penicillin V Potassium Other (See Comments)  . Sulfa Antibiotics   . Tiotropium Bromide Monohydrate     Blurry vision  . Pantoprazole Rash   Current Medications: Current Outpatient Prescriptions  Medication Sig Dispense Refill  . ADVAIR DISKUS 250-50 MCG/DOSE AEPB INHALE 1 PUFF INTO THE LUNGS Q 12 H PRN  11  . albuterol (PROVENTIL) (2.5 MG/3ML) 0.083% nebulizer solution 2.5 mg 2 (two) times daily.    [name] Kitchen amLODipine (NORVASC) 5 MG tablet Take 1 tablet by mouth daily.    . B Complex Vitamins (VITAMIN B COMPLEX PO) Take 1 tablet by mouth daily.    . benztropine (COGENTIN) 0.5 MG tablet Take 1 tablet (0.5 mg total) by mouth at bedtime. 60 tablet 4  . Butalbital-APAP-Caffeine 50-325-40 MG per capsule Take 1 capsule by mouth every 4 (four) hours as needed.    . Calcium Carbonate-Vitamin D 600-400 MG-UNIT per tablet Take 1 tablet by mouth 2 (two) times daily.    . clonazePAM (KLONOPIN) 0.5 MG tablet Take 2 tablets (1 mg total) by mouth at bedtime. 60 tablet 4  . clotrimazole-betamethasone (LOTRISONE) cream 1 application 2 (two) times daily.    . Cranberry 250 MG CAPS Take 1 capsule by mouth 2 (two)  times daily.    . fluticasone (FLONASE) 50 MCG/ACT nasal spray Place 2 sprays into the nose daily.    [name] Kitchen loratadine (CLARITIN) 10 MG tablet TK 1 T PO  QD  6  . Loratadine 10 MG CAPS Take 1 capsule (10 mg total) by mouth daily. 30 each 6  . Magnesium 250 MG TABS Take 1 tablet by mouth at bedtime.     . Misc Natural Products (OSTEO BI-FLEX ADV JOINT SHIELD PO) Take 1 tablet by mouth 2 (two) times daily.    . montelukast (SINGULAIR) 10 MG tablet Take 1 tablet (10 mg total) by mouth daily. 30 tablet 6  . Multiple Vitamin (MULTI-VITAMINS) TABS Take 1  tablet by mouth daily.    [name] Kitchen omeprazole (PRILOSEC OTC) 20 MG tablet Take 1 tablet by mouth daily.    [name] Kitchen oxybutynin (DITROPAN-XL) 10 MG 24 hr tablet Take 1 tablet by mouth daily.    . Probiotic Product (ALIGN) 4 MG CAPS Take 1 capsule by mouth daily.    . ranitidine (ZANTAC) 150 MG capsule Take 1 capsule (150 mg total) by mouth every evening. 90 capsule 4  . sertraline (ZOLOFT) 100 MG tablet Take 1 tablet (100 mg total) by mouth daily. 30 tablet 4  . theophylline (THEO-24) 400 MG 24 hr capsule Take 1 capsule by mouth daily.    [name] Kitchen tiZANidine (ZANAFLEX) 4 MG tablet Take 1 tablet by mouth daily.    . traMADol (ULTRAM) 50 MG tablet TAKE 1 TABLET BY MOUTH EVERY 8 HOURS AS NEEDED 30 tablet 3  . traZODone (DESYREL) 50 MG tablet Take 2 tablets (100 mg total) by mouth at bedtime. 60 tablet 4  . trifluoperazine (STELAZINE) 10 MG tablet Take 1 tablet (10 mg total) by mouth at bedtime. 30 tablet 4  . Fluticasone-Salmeterol (ADVAIR) 250-50 MCG/DOSE AEPB Inhale 1 puff into the lungs every 12 (twelve) hours.     No current facility-administered medications for this visit.    Previous Psychotropic Medications: Yes   Substance Abuse History in the last 12 months:  No.  Consequences of Substance Abuse: NA  Medical Decision Making:  Established Problem, Stable/Improving (1) and Review of Medication Regimen & Side Effects (2)  Treatment Plan Summary: Medication  management and Plan   Schizophrenia-patient appears to have been stable on her current medication regimen for several decades. Thus at this time writer is not going to change her medications. In conditions writer will be departing the clinic in the next few months and this would not be an optimal time to change any of her medications. She is done well on them. Thus we'll continue her Stelazine 10 mg at bedtime, her sertraline at 100 mg daily, her trazodone at 100 mg at bedtime, her benztropine at 0.5 mg at bedtime and her Klonopin at 1 mg at bedtime. Risk and benefits of all these medications have been reviewed and patient is able to consent to continuing them. She'll follow with me in 1 month.   In regards to risk assessment the patient does have a distant past suicide attempt and age and race as risk factors. She has protective factors of forward thinking, good response to medications, social supports and gender as protective. At this time low risk of imminent harm to herself or others.    Faith Rogue 12/8/20162:04 PM

## 2015-05-19 ENCOUNTER — Ambulatory Visit (INDEPENDENT_AMBULATORY_CARE_PROVIDER_SITE_OTHER): Payer: Commercial Managed Care - HMO | Admitting: Family Medicine

## 2015-05-19 ENCOUNTER — Encounter: Payer: Self-pay | Admitting: Family Medicine

## 2015-05-19 VITALS — BP 148/78 | HR 76 | Temp 97.8°F | Resp 20 | Wt 248.0 lb

## 2015-05-19 DIAGNOSIS — J449 Chronic obstructive pulmonary disease, unspecified: Secondary | ICD-10-CM | POA: Diagnosis not present

## 2015-05-19 DIAGNOSIS — K5900 Constipation, unspecified: Secondary | ICD-10-CM

## 2015-05-19 DIAGNOSIS — R5382 Chronic fatigue, unspecified: Secondary | ICD-10-CM | POA: Diagnosis not present

## 2015-05-19 DIAGNOSIS — N390 Urinary tract infection, site not specified: Secondary | ICD-10-CM | POA: Diagnosis not present

## 2015-05-19 DIAGNOSIS — M5442 Lumbago with sciatica, left side: Secondary | ICD-10-CM

## 2015-05-19 LAB — POCT URINALYSIS DIPSTICK
Bilirubin, UA: NEGATIVE
Blood, UA: NEGATIVE
GLUCOSE UA: NEGATIVE
KETONES UA: NEGATIVE
Nitrite, UA: NEGATIVE
PROTEIN UA: NEGATIVE
Spec Grav, UA: 1.015
UROBILINOGEN UA: 0.2
pH, UA: 6.5

## 2015-05-19 MED ORDER — OXYBUTYNIN CHLORIDE ER 10 MG PO TB24: 10.0000 mg | ORAL_TABLET | Freq: Every day | ORAL | Status: DC

## 2015-05-19 NOTE — Progress Notes (Signed)
Patient: Vicki Morales Female    DOB: 12-30-1940   74 y.o.   MRN: [id number] Visit Date: 05/19/2015  Today's Provider: Lelon Huh, MD   Chief Complaint  Patient presents with  . Anorexia   Subjective:    HPI  Decreased Appetite:  Patient comes in today stating that she does not have much on an appetite. Patient has only  been eating 1-2 meals a day. Patient states she has to make herself eat. Patient has also had  a decrease in bowel movements. She states the stool is normal and she is not constipated;  she just doesn't have the urge to have a bowel movement. Patient States she has also had  more pain in her left knee and has been having to take 2 Tramadol most days. Patient also  complains of Abdominal bloating. Patient was seen by her Psychiatrist 1 week ago and had a  low grade fever of 99. She took 7 days of cipro starting 11/10 for UTI when she was having dysruia, which has resolved.    Wt Readings from Last 3 Encounters:  05/19/15 248 lb (112.492 kg)  05/15/15 246 lb 12.8 oz (111.948 kg)  04/16/15 244 lb (110.678 kg)       Allergies  Allergen Reactions  . Acetaminophen Other (See Comments)  . Arthrotec  [Diclofenac-Misoprostol]   . Codeine   . Diclofenac Other (See Comments)  . Hydrochlorothiazide     urinary incontinence  . Morphine   . Penicillin V Potassium Other (See Comments)  . Sulfa Antibiotics   . Tiotropium Bromide Monohydrate     Blurry vision  . Pantoprazole Rash   Previous Medications   ADVAIR DISKUS 250-50 MCG/DOSE AEPB    INHALE 1 PUFF INTO THE LUNGS Q 12 H PRN   ALBUTEROL (PROVENTIL) (2.5 MG/3ML) 0.083% NEBULIZER SOLUTION    2.5 mg 2 (two) times daily.   AMLODIPINE (NORVASC) 5 MG TABLET    Take 1 tablet by mouth daily.   B COMPLEX VITAMINS (VITAMIN B COMPLEX PO)    Take 1 tablet by mouth daily.   BENZTROPINE (COGENTIN) 0.5 MG TABLET    Take 1 tablet (0.5 mg total) by mouth at bedtime.   BUTALBITAL-APAP-CAFFEINE 50-325-40 MG PER CAPSULE     Take 1 capsule by mouth every 4 (four) hours as needed.   CALCIUM CARBONATE-VITAMIN D 600-400 MG-UNIT PER TABLET    Take 1 tablet by mouth 2 (two) times daily.   CLONAZEPAM (KLONOPIN) 0.5 MG TABLET    Take 2 tablets (1 mg total) by mouth at bedtime.   CLOTRIMAZOLE-BETAMETHASONE (LOTRISONE) CREAM    1 application 2 (two) times daily.   CRANBERRY 250 MG CAPS    Take 1 capsule by mouth 2 (two) times daily.   FAMOTIDINE (PEPCID) 10 MG TABLET    Take 10 mg by mouth at bedtime.   FLUTICASONE (FLONASE) 50 MCG/ACT NASAL SPRAY    Place 2 sprays into the nose daily.   FLUTICASONE-SALMETEROL (ADVAIR) 250-50 MCG/DOSE AEPB    Inhale 1 puff into the lungs every 12 (twelve) hours.   LORATADINE (CLARITIN) 10 MG TABLET    TK 1 T PO  QD   LORATADINE 10 MG CAPS    Take 1 capsule (10 mg total) by mouth daily.   MAGNESIUM 250 MG TABS    Take 1 tablet by mouth at bedtime.    MISC NATURAL PRODUCTS (OSTEO BI-FLEX ADV JOINT SHIELD PO)    Take 1  tablet by mouth 2 (two) times daily.   MONTELUKAST (SINGULAIR) 10 MG TABLET    Take 1 tablet (10 mg total) by mouth daily.   MULTIPLE VITAMIN (MULTI-VITAMINS) TABS    Take 1 tablet by mouth daily.   OMEPRAZOLE (PRILOSEC OTC) 20 MG TABLET    Take 1 tablet by mouth daily.   OXYBUTYNIN (DITROPAN-XL) 10 MG 24 HR TABLET    Take 1 tablet by mouth daily.   PROBIOTIC PRODUCT (ALIGN) 4 MG CAPS    Take 1 capsule by mouth daily.   SERTRALINE (ZOLOFT) 100 MG TABLET    Take 1 tablet (100 mg total) by mouth daily.   THEOPHYLLINE (THEO-24) 400 MG 24 HR CAPSULE    Take 1 capsule by mouth daily.   TIZANIDINE (ZANAFLEX) 4 MG TABLET    Take 1 tablet by mouth daily.   TRAMADOL (ULTRAM) 50 MG TABLET    TAKE 1 TABLET BY MOUTH EVERY 8 HOURS AS NEEDED   TRAZODONE (DESYREL) 50 MG TABLET    Take 2 tablets (100 mg total) by mouth at bedtime.   TRIFLUOPERAZINE (STELAZINE) 10 MG TABLET    Take 1 tablet (10 mg total) by mouth at bedtime.    Review of Systems  Constitutional: Positive for fever, appetite  change and fatigue. Negative for chills.  Respiratory: Negative for chest tightness and shortness of breath.   Cardiovascular: Negative for chest pain and palpitations.  Gastrointestinal: Positive for abdominal distention. Negative for nausea, vomiting, abdominal pain, diarrhea, constipation and blood in stool.       Change in bowel habits  Musculoskeletal: Positive for arthralgias (left knee).  Neurological: Negative for dizziness and weakness.    Social History  Substance Use Topics  . Smoking status: Former Smoker -- 30 years  . Smokeless tobacco: Never Used     Comment: quit in 1990's  . Alcohol Use: No   Objective:   BP 148/78 mmHg  Pulse 76  Temp(Src) 97.8 F (36.6 C) (Oral)  Resp 20  Wt 248 lb (112.492 kg)  SpO2 94%  Physical Exam   General Appearance:    Alert, cooperative, no distress, obese  Eyes:    PERRL, conjunctiva/corneas clear, EOM's intact       Lungs:     Clear to auscultation bilaterally, respirations unlabored  Heart:    Regular rate and rhythm, 1+ bilateral LE edema.   Neurologic:   Awake, alert, oriented x 3. No apparent focal neurological           defect.          Results for orders placed or performed in visit on 05/19/15  POCT urinalysis dipstick  Result Value Ref Range   Color, UA Yellow    Clarity, UA Clear    Glucose, UA Neg    Bilirubin, UA Neg    Ketones, UA Neg    Spec Grav, UA 1.015    Blood, UA Neg    pH, UA 6.5    Protein, UA Neg    Urobilinogen, UA 0.2    Nitrite, UA Neg    Leukocytes, UA moderate (2+) (A) Negative       Assessment & Plan:     1. COPD, moderate (HCC)  - Theophylline level  2. Constipation, unspecified constipation type   3. Chronic fatigue She states she has poor appetite, yet gaining weight.  - T4 AND TSH - CBC - Comprehensive metabolic panel   5. UTI (lower urinary tract infection)  - Urine culture -  POCT urinalysis dipstick        Lelon Huh, MD  [facility]

## 2015-05-20 ENCOUNTER — Telehealth: Payer: Self-pay | Admitting: Family Medicine

## 2015-05-20 ENCOUNTER — Ambulatory Visit: Payer: Self-pay | Admitting: Family Medicine

## 2015-05-20 ENCOUNTER — Ambulatory Visit: Payer: Commercial Managed Care - HMO | Admitting: Psychiatry

## 2015-05-20 LAB — URINE CULTURE

## 2015-05-20 MED ORDER — NITROFURANTOIN MACROCRYSTAL 100 MG PO CAPS: 100.0000 mg | ORAL_CAPSULE | Freq: Two times a day (BID) | ORAL | Status: AC

## 2015-05-20 NOTE — Telephone Encounter (Signed)
Have sent antibiotic prescription to [facility]

## 2015-05-20 NOTE — Telephone Encounter (Signed)
Patient is requesting rx for UTI symptoms? Patient stated that she is feeling worse than yesterday. Patient also said that it will be Thursday before she can get her labs done.

## 2015-05-20 NOTE — Telephone Encounter (Signed)
Pt stated that she couldn't get her labs done until Thursday 05/22/15 and wanted to make sure that was ok. Please advise. Thanks TNP

## 2015-05-20 NOTE — Telephone Encounter (Signed)
Patient notified

## 2015-05-20 NOTE — Telephone Encounter (Signed)
Pt called back to request a call back to discuss the Rx called in yesterday/MW

## 2015-05-22 DIAGNOSIS — R5382 Chronic fatigue, unspecified: Secondary | ICD-10-CM | POA: Diagnosis not present

## 2015-05-22 DIAGNOSIS — J449 Chronic obstructive pulmonary disease, unspecified: Secondary | ICD-10-CM | POA: Diagnosis not present

## 2015-05-23 LAB — CBC
HEMATOCRIT: 46.5 % (ref 34.0–46.6)
HEMOGLOBIN: 15.4 g/dL (ref 11.1–15.9)
MCH: 30.7 pg (ref 26.6–33.0)
MCHC: 33.1 g/dL (ref 31.5–35.7)
MCV: 93 fL (ref 79–97)
PLATELETS: 192 10*3/uL (ref 150–379)
RBC: 5.02 x10E6/uL (ref 3.77–5.28)
RDW: 13.8 % (ref 12.3–15.4)
WBC: 6.7 10*3/uL (ref 3.4–10.8)

## 2015-05-23 LAB — COMPREHENSIVE METABOLIC PANEL
A/G RATIO: 2.5 (ref 1.1–2.5)
ALBUMIN: 4.3 g/dL (ref 3.5–4.8)
ALT: 14 IU/L (ref 0–32)
AST: 18 IU/L (ref 0–40)
Alkaline Phosphatase: 94 IU/L (ref 39–117)
BILIRUBIN TOTAL: 0.4 mg/dL (ref 0.0–1.2)
BUN / CREAT RATIO: 17 (ref 11–26)
BUN: 14 mg/dL (ref 8–27)
CALCIUM: 10.1 mg/dL (ref 8.7–10.3)
CHLORIDE: 100 mmol/L (ref 96–106)
CO2: 24 mmol/L (ref 18–29)
Creatinine, Ser: 0.84 mg/dL (ref 0.57–1.00)
GFR, EST AFRICAN AMERICAN: 79 mL/min/{1.73_m2} (ref >59)
GFR, EST NON AFRICAN AMERICAN: 68 mL/min/{1.73_m2} (ref >59)
GLOBULIN, TOTAL: 1.7 g/dL (ref 1.5–4.5)
Glucose: 116 mg/dL — ABNORMAL HIGH (ref 65–99)
POTASSIUM: 3.7 mmol/L (ref 3.5–5.2)
SODIUM: 141 mmol/L (ref 134–144)
TOTAL PROTEIN: 6 g/dL (ref 6.0–8.5)

## 2015-05-23 LAB — THEOPHYLLINE LEVEL: THEOPHYLLINE LVL: 5.9 ug/mL — AB (ref 10.0–20.0)

## 2015-05-23 LAB — T4 AND TSH
T4, Total: 8.8 ug/dL (ref 4.5–12.0)
TSH: 1.22 u[IU]/mL (ref 0.450–4.500)

## 2015-06-12 ENCOUNTER — Ambulatory Visit: Payer: Commercial Managed Care - HMO | Admitting: Psychiatry

## 2015-06-12 ENCOUNTER — Other Ambulatory Visit: Payer: Self-pay | Admitting: Family Medicine

## 2015-06-12 DIAGNOSIS — R140 Abdominal distension (gaseous): Secondary | ICD-10-CM | POA: Diagnosis not present

## 2015-06-12 DIAGNOSIS — R143 Flatulence: Secondary | ICD-10-CM | POA: Diagnosis not present

## 2015-06-13 ENCOUNTER — Ambulatory Visit: Payer: Self-pay | Admitting: Family Medicine

## 2015-06-17 ENCOUNTER — Telehealth: Payer: Self-pay | Admitting: Family Medicine

## 2015-06-17 MED ORDER — CIPROFLOXACIN HCL 500 MG PO TABS: 500.0000 mg | ORAL_TABLET | Freq: Two times a day (BID) | ORAL | Status: DC

## 2015-06-17 NOTE — Telephone Encounter (Signed)
Returned call to patient. Patient stated that she is having symptoms of frequency, urgency, vaginal itching, strong odor to urine, and lower back pain. Patient also has symptoms of weakness and fatigue. No fever, no abdominal pain and no pelvic pain. Patient stated that she does not have enough strength to come in for ov. Please advise?

## 2015-06-17 NOTE — Telephone Encounter (Signed)
Pt wants to know if you can call in her another antibiotic for UTI.  She uses [facility] in [place]  Her call back is [phone]  [facility]

## 2015-06-17 NOTE — Telephone Encounter (Signed)
Advised patient as below. Sent in medication into pharmacy.  

## 2015-06-17 NOTE — Telephone Encounter (Signed)
Can send in rx for Cipro 500mg  twice daily for 7 days. Call if not rapidly improving, or if she has any fever pain, or vomiting.

## 2015-06-22 ENCOUNTER — Other Ambulatory Visit: Payer: Self-pay | Admitting: Family Medicine

## 2015-06-23 ENCOUNTER — Telehealth: Payer: Self-pay | Admitting: Family Medicine

## 2015-06-23 MED ORDER — FLUCONAZOLE 150 MG PO TABS: 150.0000 mg | ORAL_TABLET | Freq: Once | ORAL | Status: DC

## 2015-06-23 NOTE — Telephone Encounter (Signed)
Patient was notified.

## 2015-06-23 NOTE — Telephone Encounter (Signed)
Pt stated that she will finish ciprofloxacin (CIPRO) 500 MG tablet tonight but she thinks she is having vaginal discharge b/c she feels "very moist" between her legs and has been itchy there as well. Pharmacy: [facility]. Please advise. Thanks TNP

## 2015-06-23 NOTE — Telephone Encounter (Signed)
Please advise 

## 2015-06-23 NOTE — Telephone Encounter (Signed)
Have sent fluconazole tablet to pharmacy. Just need to take once.

## 2015-06-26 ENCOUNTER — Ambulatory Visit (INDEPENDENT_AMBULATORY_CARE_PROVIDER_SITE_OTHER): Payer: Commercial Managed Care - HMO | Admitting: Psychiatry

## 2015-06-26 ENCOUNTER — Encounter: Payer: Self-pay | Admitting: Psychiatry

## 2015-06-26 VITALS — BP 124/75 | HR 93 | Temp 98.4°F | Ht 63.0 in | Wt 250.6 lb

## 2015-06-26 DIAGNOSIS — F209 Schizophrenia, unspecified: Secondary | ICD-10-CM | POA: Diagnosis not present

## 2015-06-26 NOTE — Progress Notes (Signed)
BH MD/PA/NP OP Progress Note  06/26/2015 2:03 PM Vicki Morales  MRN:  [id number]  Subjective:  Patient presents for follow-up of her schizophrenia. She presented last month for her first appointment after being transferred from another psychiatrist closed his practice in this area. She presented on the medication combination of sertraline 100 mg daily, trazodone 100 mg at bedtime, Stelazine and milligrams daily, Cogentin 0.5 mg at bedtime and Klonopin 1 mg at bedtime.  He states she continues to feel good on her medications and she denies any side effects. She states that she has a goal of trying to lose weight. She states she wants to just make dietary modifications. She states that she largely stays in the house because it's difficult for her to get around. She states her daughter works. She states she typically gets out for doctors appointments.  Chief Complaint: I need to lose weight Chief Complaint    Follow-up; Medication Refill     Visit Diagnosis:     ICD-9-CM ICD-10-CM   1. Schizophrenia, unspecified type ([place])  F20.9     Past Medical History:  Past Medical History  Diagnosis Date  . COPD (chronic obstructive pulmonary disease) ([place])   . Hypertension   . Depression   . Anxiety     Past Surgical History  Procedure Laterality Date  . Abdominal hysterectomy  1988  . Appendectomy  1988  . Tonsillectomy and adenoidectomy    . Trigger finger release  2004  . Esophagogastroduodenoscopy N/A 11/11/2014    Procedure: ESOPHAGOGASTRODUODENOSCOPY (EGD);  Surgeon: Josefine Class, MD;  Location: [facility] ENDOSCOPY;  Service: Endoscopy;  Laterality: N/A;   Family History:  Family History  Problem Relation Age of Onset  . Breast cancer Sister   . Stroke Father   . Emphysema Father   . Anxiety disorder Father   . Depression Father   . Anxiety disorder Brother    Social History:  Social History   Social History  . Marital Status: Widowed    Spouse Name: N/A  . Number of  Children: N/A  . Years of Education: N/A   Social History Main Topics  . Smoking status: Former Smoker -- 30 years  . Smokeless tobacco: Never Used     Comment: quit in 1990's  . Alcohol Use: No  . Drug Use: No  . Sexual Activity: Not Currently   Other Topics Concern  . None   Social History Narrative   Additional History:   Assessment:   Musculoskeletal: Strength & Muscle Tone: within normal limits Gait & Station: Slow and walks with a cane Patient leans: N/A  Psychiatric Specialty Exam: HPI  Review of Systems  Psychiatric/Behavioral: Negative for depression, suicidal ideas, hallucinations, memory loss and substance abuse. The patient is not nervous/anxious and does not have insomnia.   All other systems reviewed and are negative.   Blood pressure 124/75, pulse 93, temperature 98.4 F (36.9 C), temperature source Tympanic, height 5\' 3"  (1.6 m), weight 250 lb 9.6 oz (113.671 kg), SpO2 94 %.Body mass index is 44.4 kg/(m^2).  General Appearance: Well Groomed  Eye Contact:  Good  Speech:  Normal Rate  Volume:  Normal  Mood:  Good  Affect:  Congruent Bright, smiling   Thought Process:  Linear and Logical  Orientation:  Full (Time, Place, and Person)  Thought Content:  Negative  Suicidal Thoughts:  No  Homicidal Thoughts:  No  Memory:  Immediate;   Good Recent;   Good Remote;   Good  Judgement:  Good  Insight:  Good  Psychomotor Activity:  Normal  Concentration:  Good  Recall:  Good  Fund of Knowledge: Good  Language: Good  Akathisia:  Negative  Handed:    AIMS (if indicated):  Done 05/15/15 normal  Assets:  Communication Skills Desire for Improvement  ADL's:  Intact  Cognition: WNL  Sleep:  Good states 12 midnight to 8am last night   Is the patient at risk to self?  No. Has the patient been a risk to self in the past 6 months?  No. Has the patient been a risk to self within the distant past?  Yes.  OD in 1979 Is the patient a risk to others?  No. Has the  patient been a risk to others in the past 6 months?  No. Has the patient been a risk to others within the distant past?  No.  Current Medications: Current Outpatient Prescriptions  Medication Sig Dispense Refill  . ADVAIR DISKUS 250-50 MCG/DOSE AEPB INHALE 1 PUFF INTO THE LUNGS Q 12 H PRN  11  . albuterol (PROVENTIL) (2.5 MG/3ML) 0.083% nebulizer solution 2.5 mg 2 (two) times daily.    [name] Kitchen amLODipine (NORVASC) 5 MG tablet TAKE 1 TABLET BY MOUTH EVERY DAY 90 tablet 4  . B Complex Vitamins (VITAMIN B COMPLEX PO) Take 1 tablet by mouth daily.    . benztropine (COGENTIN) 0.5 MG tablet Take 1 tablet (0.5 mg total) by mouth at bedtime. 60 tablet 4  . Butalbital-APAP-Caffeine 50-325-40 MG per capsule Take 1 capsule by mouth every 4 (four) hours as needed.    . Calcium Carbonate-Vitamin D 600-400 MG-UNIT per tablet Take 1 tablet by mouth 2 (two) times daily.    . clonazePAM (KLONOPIN) 0.5 MG tablet Take 2 tablets (1 mg total) by mouth at bedtime. 60 tablet 4  . Cranberry 250 MG CAPS Take 1 capsule by mouth 2 (two) times daily.    . famotidine (PEPCID) 10 MG tablet Take 10 mg by mouth at bedtime.    [name] Kitchen loratadine (CLARITIN) 10 MG tablet TK 1 T PO  QD  6  . Loratadine 10 MG CAPS Take 1 capsule (10 mg total) by mouth daily. 30 each 6  . Magnesium 250 MG TABS Take 1 tablet by mouth at bedtime.     . Misc Natural Products (OSTEO BI-FLEX ADV JOINT SHIELD PO) Take 1 tablet by mouth 2 (two) times daily.    . montelukast (SINGULAIR) 10 MG tablet Take 1 tablet (10 mg total) by mouth daily. 30 tablet 6  . Multiple Vitamin (MULTI-VITAMINS) TABS Take 1 tablet by mouth daily.    [name] Kitchen omeprazole (PRILOSEC OTC) 20 MG tablet Take 1 tablet by mouth daily.    [name] Kitchen oxybutynin (DITROPAN-XL) 10 MG 24 hr tablet Take 1 tablet (10 mg total) by mouth daily. 1 tablet 0  . Probiotic Product (ALIGN) 4 MG CAPS Take 1 capsule by mouth daily.    . sertraline (ZOLOFT) 100 MG tablet Take 1 tablet (100 mg total) by mouth daily. 30 tablet 4   . THEO-24 400 MG 24 hr capsule TAKE 1 CAPSULE BY MOUTH DAILY 30 capsule 6  . tiZANidine (ZANAFLEX) 4 MG tablet Take 1 tablet by mouth daily.    . traMADol (ULTRAM) 50 MG tablet TAKE 1 TABLET BY MOUTH EVERY 8 HOURS AS NEEDED 30 tablet 3  . traZODone (DESYREL) 50 MG tablet Take 2 tablets (100 mg total) by mouth at bedtime. 60 tablet 4  . trifluoperazine (STELAZINE)  10 MG tablet Take 1 tablet (10 mg total) by mouth at bedtime. 30 tablet 4  . Fluticasone-Salmeterol (ADVAIR) 250-50 MCG/DOSE AEPB Inhale 1 puff into the lungs every 12 (twelve) hours.     No current facility-administered medications for this visit.    Medical Decision Making:  Established Problem, Stable/Improving (1) and Review of Medication Regimen & Side Effects (2)  Treatment Plan Summary:Medication management and Plan Plan Schizophrenia-patient appears to have been stable on her current medication regimen for several decades. Thus at this time writer is not going to change her medications. Thus we'll continue her Stelazine 10 mg at bedtime, her sertraline at 100 mg daily, her trazodone at 100 mg at bedtime, her benztropine at 0.5 mg at bedtime and her Klonopin at 1 mg at bedtime. Risk and benefits of all these medications have been reviewed and patient is able to consent to continuing them.   She will follow-up in 3 months. She's aware of the Department practice and then she can continue to follow with another provider within the clinic.  Faith Rogue 06/26/2015, 2:03 PM

## 2015-07-03 ENCOUNTER — Ambulatory Visit (INDEPENDENT_AMBULATORY_CARE_PROVIDER_SITE_OTHER): Payer: Commercial Managed Care - HMO | Admitting: Family Medicine

## 2015-07-03 ENCOUNTER — Encounter: Payer: Self-pay | Admitting: Family Medicine

## 2015-07-03 ENCOUNTER — Telehealth: Payer: Self-pay

## 2015-07-03 VITALS — BP 136/66 | HR 75 | Temp 97.5°F | Resp 20 | Wt 251.0 lb

## 2015-07-03 DIAGNOSIS — R350 Frequency of micturition: Secondary | ICD-10-CM | POA: Diagnosis not present

## 2015-07-03 DIAGNOSIS — L719 Rosacea, unspecified: Secondary | ICD-10-CM

## 2015-07-03 DIAGNOSIS — N390 Urinary tract infection, site not specified: Secondary | ICD-10-CM

## 2015-07-03 LAB — POCT URINALYSIS DIPSTICK
Bilirubin, UA: NEGATIVE
Glucose, UA: NEGATIVE
Ketones, UA: NEGATIVE
Nitrite, UA: NEGATIVE
PROTEIN UA: NEGATIVE
RBC UA: NEGATIVE
SPEC GRAV UA: 1.01
UROBILINOGEN UA: 0.2
pH, UA: 6

## 2015-07-03 MED ORDER — CIPROFLOXACIN HCL 500 MG PO TABS: 500.0000 mg | ORAL_TABLET | Freq: Two times a day (BID) | ORAL | Status: AC

## 2015-07-03 MED ORDER — METRONIDAZOLE 1 % EX GEL: CUTANEOUS | Status: DC

## 2015-07-03 NOTE — Telephone Encounter (Signed)
[facility] pharmacist Gerald Stabs called stating that the Cipro that was prescribed today has a drug interaction with 2 medications Patient is currently taking. The Tizanidine and Theophylin. Per Gerald Stabs Cipro interferes with the metabolism of these medications increasing the blood levels. Please call Gerald Stabs back with further instrution on whether patient should put these medications on hold until finished with Cipro or if the antibiotic should be changed to something else. Call back [phone].  I spoke with Dr. Caryn Section and advised him of the interaction. Dr. Caryn Section states he is aware and patient can continue taking medication. Gerald Stabs advised.

## 2015-07-03 NOTE — Progress Notes (Signed)
Patient: Vicki Morales Female    DOB: 11-Nov-1940   75 y.o.   MRN: [id number] Visit Date: 07/03/2015  Today's Provider: Lelon Huh, MD   Chief Complaint  Patient presents with  . Rash   Subjective:    Rash This is a new problem. Episode onset: first appeared 1-2 weeks ago. The problem has been gradually worsening since onset. The affected locations include the face. The rash is characterized by dryness, scaling and redness. She was exposed to nothing. Associated symptoms include coughing and shortness of breath. Pertinent negatives include no congestion, diarrhea, fever, rhinorrhea, sore throat or vomiting.  Patient washes her face with Olay facial cleanser and apply's Crisco vegetable oil afterwards as a moistureizer.      Allergies  Allergen Reactions  . Acetaminophen Other (See Comments)  . Arthrotec  [Diclofenac-Misoprostol]   . Codeine   . Diclofenac Other (See Comments)  . Diclofenac-Misoprostol Other (See Comments)  . Hydrochlorothiazide     urinary incontinence  . Morphine   . Penicillin V Potassium Other (See Comments)  . Sulfa Antibiotics   . Tiotropium Bromide Monohydrate     Blurry vision  . Pantoprazole Rash   Previous Medications   ADVAIR DISKUS 250-50 MCG/DOSE AEPB    INHALE 1 PUFF INTO THE LUNGS Q 12 H PRN   ALBUTEROL (PROVENTIL) (2.5 MG/3ML) 0.083% NEBULIZER SOLUTION    2.5 mg 2 (two) times daily.   AMLODIPINE (NORVASC) 5 MG TABLET    TAKE 1 TABLET BY MOUTH EVERY DAY   B COMPLEX VITAMINS (VITAMIN B COMPLEX PO)    Take 1 tablet by mouth daily.   BENZTROPINE (COGENTIN) 0.5 MG TABLET    Take 1 tablet (0.5 mg total) by mouth at bedtime.   BUTALBITAL-APAP-CAFFEINE 50-325-40 MG PER CAPSULE    Take 1 capsule by mouth every 4 (four) hours as needed.   CALCIUM CARBONATE-VITAMIN D 600-400 MG-UNIT PER TABLET    Take 1 tablet by mouth 2 (two) times daily.   CLONAZEPAM (KLONOPIN) 0.5 MG TABLET    Take 2 tablets (1 mg total) by mouth at bedtime.   CRANBERRY  250 MG CAPS    Take 1 capsule by mouth 2 (two) times daily.   FAMOTIDINE (PEPCID) 10 MG TABLET    Take 10 mg by mouth at bedtime. Reported on 07/03/2015   FLUTICASONE-SALMETEROL (ADVAIR) 250-50 MCG/DOSE AEPB    Inhale 1 puff into the lungs every 12 (twelve) hours.   LORATADINE (CLARITIN) 10 MG TABLET    TK 1 T PO  QD   LORATADINE 10 MG CAPS    Take 1 capsule (10 mg total) by mouth daily.   MAGNESIUM 250 MG TABS    Take 1 tablet by mouth at bedtime.    MISC NATURAL PRODUCTS (OSTEO BI-FLEX ADV JOINT SHIELD PO)    Take 1 tablet by mouth 2 (two) times daily.   MONTELUKAST (SINGULAIR) 10 MG TABLET    Take 1 tablet (10 mg total) by mouth daily.   MULTIPLE VITAMIN (MULTI-VITAMINS) TABS    Take 1 tablet by mouth daily.   OMEPRAZOLE (PRILOSEC OTC) 20 MG TABLET    Take 1 tablet by mouth daily.   OXYBUTYNIN (DITROPAN-XL) 10 MG 24 HR TABLET    Take 1 tablet (10 mg total) by mouth daily.   PROBIOTIC PRODUCT (ALIGN) 4 MG CAPS    Take 1 capsule by mouth daily.   SERTRALINE (ZOLOFT) 100 MG TABLET    Take 1 tablet (  100 mg total) by mouth daily.   THEO-24 400 MG 24 HR CAPSULE    TAKE 1 CAPSULE BY MOUTH DAILY   TIZANIDINE (ZANAFLEX) 4 MG TABLET    Take 1 tablet by mouth daily.   TRAMADOL (ULTRAM) 50 MG TABLET    TAKE 1 TABLET BY MOUTH EVERY 8 HOURS AS NEEDED   TRAZODONE (DESYREL) 50 MG TABLET    Take 2 tablets (100 mg total) by mouth at bedtime.   TRIFLUOPERAZINE (STELAZINE) 10 MG TABLET    Take 1 tablet (10 mg total) by mouth at bedtime.    Review of Systems  Constitutional: Negative for fever.  HENT: Negative for congestion, rhinorrhea and sore throat.   Respiratory: Positive for cough and shortness of breath.   Gastrointestinal: Negative for vomiting and diarrhea.  Genitourinary: Positive for frequency.  Skin: Positive for color change and rash.    Social History  Substance Use Topics  . Smoking status: Former Smoker -- 30 years  . Smokeless tobacco: Never Used     Comment: quit in 1990's  . Alcohol  Use: No   Objective:   BP 136/66 mmHg  Pulse 75  Temp(Src) 97.5 F (36.4 C) (Oral)  Resp 20  Wt 251 lb (113.853 kg)  SpO2 95%  Physical Exam  General appearance: alert, well developed, well nourished, cooperative and in no distress Head: Normocephalic, without obvious abnormality, atraumatic Lungs: Respirations even and unlabored Extremities: No gross deformities Skin: Several patches of erythema on malar surfaces and forehead consistent with rosacea.  Psych: Appropriate mood and affect. Neurologic: Mental status: Alert, oriented to person, place, and time, thought content appropriate.  Results for orders placed or performed in visit on 07/03/15  POCT Urinalysis Dipstick  Result Value Ref Range   Color, UA yellow    Clarity, UA clear    Glucose, UA negative    Bilirubin, UA negative    Ketones, UA negative    Spec Grav, UA 1.010    Blood, UA negative    pH, UA 6.0    Protein, UA negative    Urobilinogen, UA 0.2    Nitrite, UA negative    Leukocytes, UA moderate (2+) (A) Negative       Assessment & Plan:     1. Urinary frequency  - POCT Urinalysis Dipstick  2. Urinary tract infection without hematuria, site unspecified  - Urine Culture - ciprofloxacin (CIPRO) 500 MG tablet; Take 1 tablet (500 mg total) by mouth 2 (two) times daily.  Dispense: 20 tablet; Refill: 0  3. Rosacea  - metroNIDAZOLE (METROGEL) 1 % gel; Apply to affected area every evening  Dispense: 60 g; Refill: 0       Lelon Huh, MD  [facility]

## 2015-07-04 ENCOUNTER — Telehealth: Payer: Self-pay | Admitting: Family Medicine

## 2015-07-04 NOTE — Telephone Encounter (Signed)
Please call pharmacy and see if they have a different formulation of topical metronidazole that is less expensive

## 2015-07-04 NOTE — Telephone Encounter (Signed)
Pt stated that the metroNIDAZOLE (METROGEL) 1 % gel is going to cost her 199$ out of pocket. Pt request that Dr. Caryn Section send something else to [facility] in [place] that her insurance will cover. Pt would like a call back to update her on the RX. Thanks TNP

## 2015-07-04 NOTE — Telephone Encounter (Signed)
Spoke with pharmacist and they stated that as far as they know there is no other good substitution for the Metronidazole topical for rosacea. She states they have dermatologist prescribing Doxy for that. -aa

## 2015-07-05 LAB — URINE CULTURE

## 2015-07-08 ENCOUNTER — Ambulatory Visit: Payer: Self-pay | Admitting: Family Medicine

## 2015-07-09 ENCOUNTER — Other Ambulatory Visit: Payer: Self-pay | Admitting: Family Medicine

## 2015-07-09 MED ORDER — TRAMADOL HCL 50 MG PO TABS: 50.0000 mg | ORAL_TABLET | Freq: Three times a day (TID) | ORAL | Status: DC | PRN

## 2015-07-09 NOTE — Telephone Encounter (Signed)
Please call in tramadol.  

## 2015-07-09 NOTE — Telephone Encounter (Signed)
Pt contacted office for refill request on the following medications:  traMADol (ULTRAM) 50 MG tablet.  Pt is requesting this increased to 60 pills.  [facility].  [phone]

## 2015-07-14 NOTE — Telephone Encounter (Signed)
This is second request and has already been approved 07/09/15. Pharmacy did not receive the first rx. Gave pharmacy new refill.

## 2015-07-14 NOTE — Telephone Encounter (Signed)
Pt called back asking for the early refill on her tramadol.  She is in a lot of pain and needs to take two tablets a day.  Sh euses [facility] in [place].  Her call is [phone]  Thanks teri

## 2015-07-28 ENCOUNTER — Telehealth: Payer: Self-pay | Admitting: Family Medicine

## 2015-07-28 MED ORDER — NITROFURANTOIN MONOHYD MACRO 100 MG PO CAPS: 100.0000 mg | ORAL_CAPSULE | Freq: Two times a day (BID) | ORAL | Status: AC

## 2015-07-28 NOTE — Telephone Encounter (Signed)
Called patient back for more information. Patient stated that she is having frequency, urgency, weakness, and chills. No pain or fever. Patient stated that she is to weak to come in for office visit and if we can't give her a medication she will go to the ER. Please advise?

## 2015-07-28 NOTE — Telephone Encounter (Signed)
L/M to patient saying that Rx is ready.

## 2015-07-28 NOTE — Telephone Encounter (Signed)
Pt thinks she has another UTI.  She does not want to come in.  She would like you too call something in.  Shwe uses [facility] in [place]  Call back number [phone]   Thanks,  Con Memos

## 2015-07-28 NOTE — Telephone Encounter (Signed)
Have sent antibiotic rx to [facility]. She needs office visit is not much better in 2-3 days.

## 2015-07-29 ENCOUNTER — Ambulatory Visit: Payer: Self-pay | Admitting: Family Medicine

## 2015-07-31 DIAGNOSIS — J449 Chronic obstructive pulmonary disease, unspecified: Secondary | ICD-10-CM | POA: Diagnosis not present

## 2015-07-31 DIAGNOSIS — Z8639 Personal history of other endocrine, nutritional and metabolic disease: Secondary | ICD-10-CM | POA: Diagnosis not present

## 2015-08-06 ENCOUNTER — Ambulatory Visit (INDEPENDENT_AMBULATORY_CARE_PROVIDER_SITE_OTHER): Payer: Commercial Managed Care - HMO | Admitting: Family Medicine

## 2015-08-06 ENCOUNTER — Encounter: Payer: Self-pay | Admitting: Family Medicine

## 2015-08-06 VITALS — BP 162/80 | HR 80 | Temp 97.9°F | Resp 18 | Wt 250.0 lb

## 2015-08-06 DIAGNOSIS — N390 Urinary tract infection, site not specified: Secondary | ICD-10-CM | POA: Diagnosis not present

## 2015-08-06 DIAGNOSIS — R350 Frequency of micturition: Secondary | ICD-10-CM | POA: Diagnosis not present

## 2015-08-06 LAB — POCT URINALYSIS DIPSTICK
BILIRUBIN UA: NEGATIVE
Glucose, UA: NEGATIVE
Ketones, UA: NEGATIVE
NITRITE UA: NEGATIVE
PH UA: 7.5
PROTEIN UA: NEGATIVE
RBC UA: NEGATIVE
Spec Grav, UA: 1.01
UROBILINOGEN UA: 0.2

## 2015-08-06 NOTE — Patient Instructions (Signed)
Fall Prevention in the Home  Falls can cause injuries and can affect people from all age groups. There are many simple things that you can do to make your home safe and to help prevent falls. WHAT CAN I DO ON THE OUTSIDE OF MY HOME?  Regularly repair the edges of walkways and driveways and fix any cracks.  Remove high doorway thresholds.  Trim any shrubbery on the main path into your home.  Use bright outdoor lighting.  Clear walkways of debris and clutter, including tools and rocks.  Regularly check that handrails are securely fastened and in good repair. Both sides of any steps should have handrails.  Install guardrails along the edges of any raised decks or porches.  Have leaves, snow, and ice cleared regularly.  Use sand or salt on walkways during winter months.  In the garage, clean up any spills right away, including grease or oil spills. WHAT CAN I DO IN THE BATHROOM?  Use night lights.  Install grab bars by the toilet and in the tub and shower. Do not use towel bars as grab bars.  Use non-skid mats or decals on the floor of the tub or shower.  If you need to sit down while you are in the shower, use a plastic, non-slip stool..  Keep the floor dry. Immediately clean up any water that spills on the floor.  Remove soap buildup in the tub or shower on a regular basis.  Attach bath mats securely with double-sided non-slip rug tape.  Remove throw rugs and other tripping hazards from the floor. WHAT CAN I DO IN THE BEDROOM?  Use night lights.  Make sure that a bedside light is easy to reach.  Do not use oversized bedding that drapes onto the floor.  Have a firm chair that has side arms to use for getting dressed.  Remove throw rugs and other tripping hazards from the floor. WHAT CAN I DO IN THE KITCHEN?   Clean up any spills right away.  Avoid walking on wet floors.  Place frequently used items in easy-to-reach places.  If you need to reach for something  above you, use a sturdy step stool that has a grab bar.  Keep electrical cables out of the way.  Do not use floor polish or wax that makes floors slippery. If you have to use wax, make sure that it is non-skid floor wax.  Remove throw rugs and other tripping hazards from the floor. WHAT CAN I DO IN THE STAIRWAYS?  Do not leave any items on the stairs.  Make sure that there are handrails on both sides of the stairs. Fix handrails that are broken or loose. Make sure that handrails are as long as the stairways.  Check any carpeting to make sure that it is firmly attached to the stairs. Fix any carpet that is loose or worn.  Avoid having throw rugs at the top or bottom of stairways, or secure the rugs with carpet tape to prevent them from moving.  Make sure that you have a light switch at the top of the stairs and the bottom of the stairs. If you do not have them, have them installed. WHAT ARE SOME OTHER FALL PREVENTION TIPS?  Wear closed-toe shoes that fit well and support your feet. Wear shoes that have rubber soles or low heels.  When you use a stepladder, make sure that it is completely opened and that the sides are firmly locked. Have someone hold the ladder while you   are using it. Do not climb a closed stepladder.  Add color or contrast paint or tape to grab bars and handrails in your home. Place contrasting color strips on the first and last steps.  Use mobility aids as needed, such as canes, walkers, scooters, and crutches.  Turn on lights if it is dark. Replace any light bulbs that burn out.  Set up furniture so that there are clear paths. Keep the furniture in the same spot.  Fix any uneven floor surfaces.  Choose a carpet design that does not hide the edge of steps of a stairway.  Be aware of any and all pets.  Review your medicines with your healthcare provider. Some medicines can cause dizziness or changes in blood pressure, which increase your risk of falling. Talk  with your health care provider about other ways that you can decrease your risk of falls. This may include working with a physical therapist or trainer to improve your strength, balance, and endurance.   This information is not intended to replace advice given to you by your health care provider. Make sure you discuss any questions you have with your health care provider.   Document Released: 05/14/2002 Document Revised: 10/08/2014 Document Reviewed: 06/28/2014 Elsevier Interactive Patient Education 2016 [employer]  

## 2015-08-06 NOTE — Progress Notes (Signed)
Patient: Vicki Morales Female    DOB: 01-08-1941   75 y.o.   MRN: [id number] Visit Date: 08/06/2015  Today's Provider: Lelon Huh, MD   Chief Complaint  Patient presents with  . Urinary Frequency   Subjective:    Urinary Frequency  This is a recurrent problem. Episode onset: 3 months ago. The problem has been unchanged. The patient is experiencing no pain. There has been no fever. Associated symptoms include chills, a discharge (clear colored), frequency, hematuria (patient thinks she may have scratched herself) and urgency. Pertinent negatives include no flank pain, hesitancy, nausea, sweats or vomiting. Treatments tried: has been taking Antibiotics. The treatment provided no relief. Her past medical history is significant for recurrent UTIs.       Allergies  Allergen Reactions  . Acetaminophen Other (See Comments)  . Arthrotec  [Diclofenac-Misoprostol]   . Codeine   . Diclofenac Other (See Comments)  . Diclofenac-Misoprostol Other (See Comments)  . Hydrochlorothiazide     urinary incontinence  . Morphine   . Penicillin V Potassium Other (See Comments)  . Sulfa Antibiotics   . Tiotropium Bromide Monohydrate     Blurry vision  . Pantoprazole Rash   Previous Medications   ADVAIR DISKUS 250-50 MCG/DOSE AEPB    INHALE 1 PUFF INTO THE LUNGS Q 12 H PRN   ALBUTEROL (PROVENTIL) (2.5 MG/3ML) 0.083% NEBULIZER SOLUTION    2.5 mg 2 (two) times daily.   AMLODIPINE (NORVASC) 5 MG TABLET    TAKE 1 TABLET BY MOUTH EVERY DAY   B COMPLEX VITAMINS (VITAMIN B COMPLEX PO)    Take 1 tablet by mouth daily.   BENZTROPINE (COGENTIN) 0.5 MG TABLET    Take 1 tablet (0.5 mg total) by mouth at bedtime.   BUTALBITAL-APAP-CAFFEINE 50-325-40 MG PER CAPSULE    Take 1 capsule by mouth every 4 (four) hours as needed.   CALCIUM CARBONATE-VITAMIN D 600-400 MG-UNIT PER TABLET    Take 1 tablet by mouth 2 (two) times daily.   CLONAZEPAM (KLONOPIN) 0.5 MG TABLET    Take 2 tablets (1 mg total) by mouth  at bedtime.   CRANBERRY 250 MG CAPS    Take 1 capsule by mouth 2 (two) times daily.   FAMOTIDINE (PEPCID) 10 MG TABLET    Take 10 mg by mouth at bedtime. Reported on 07/03/2015   FLUTICASONE-SALMETEROL (ADVAIR) 250-50 MCG/DOSE AEPB    Inhale 1 puff into the lungs every 12 (twelve) hours.   LORATADINE (CLARITIN) 10 MG TABLET    TK 1 T PO  QD   LORATADINE 10 MG CAPS    Take 1 capsule (10 mg total) by mouth daily.   MAGNESIUM 250 MG TABS    Take 1 tablet by mouth at bedtime.    METRONIDAZOLE (METROGEL) 1 % GEL    Apply to affected area every evening   MISC NATURAL PRODUCTS (OSTEO BI-FLEX ADV JOINT SHIELD PO)    Take 1 tablet by mouth 2 (two) times daily.   MONTELUKAST (SINGULAIR) 10 MG TABLET    Take 1 tablet (10 mg total) by mouth daily.   MULTIPLE VITAMIN (MULTI-VITAMINS) TABS    Take 1 tablet by mouth daily.   OMEPRAZOLE (PRILOSEC OTC) 20 MG TABLET    Take 1 tablet by mouth daily.   OXYBUTYNIN (DITROPAN-XL) 10 MG 24 HR TABLET    Take 1 tablet (10 mg total) by mouth daily.   PROBIOTIC PRODUCT (ALIGN) 4 MG CAPS    Take  1 capsule by mouth daily.   SERTRALINE (ZOLOFT) 100 MG TABLET    Take 1 tablet (100 mg total) by mouth daily.   THEO-24 400 MG 24 HR CAPSULE    TAKE 1 CAPSULE BY MOUTH DAILY   TIZANIDINE (ZANAFLEX) 4 MG TABLET    Take 1 tablet by mouth daily.   TRAMADOL (ULTRAM) 50 MG TABLET    Take 1 tablet (50 mg total) by mouth every 8 (eight) hours as needed.   TRAZODONE (DESYREL) 50 MG TABLET    Take 2 tablets (100 mg total) by mouth at bedtime.   TRIFLUOPERAZINE (STELAZINE) 10 MG TABLET    Take 1 tablet (10 mg total) by mouth at bedtime.    Review of Systems  Constitutional: Positive for chills and fatigue. Negative for fever and appetite change.  Respiratory: Negative for chest tightness and shortness of breath.   Cardiovascular: Negative for chest pain and palpitations.  Gastrointestinal: Negative for nausea, vomiting and abdominal pain.  Endocrine: Positive for polyuria.    Genitourinary: Positive for urgency, frequency, hematuria (patient thinks she may have scratched herself) and vaginal discharge (clear colored). Negative for dysuria, hesitancy and flank pain.       Vaginal itching  Musculoskeletal: Positive for arthralgias (in knees).  Neurological: Negative for dizziness and weakness.    Social History  Substance Use Topics  . Smoking status: Former Smoker -- 30 years  . Smokeless tobacco: Never Used     Comment: quit in 1990's  . Alcohol Use: No   Objective:   BP 162/80 mmHg  Pulse 80  Temp(Src) 97.9 F (36.6 C) (Oral)  Resp 18  Wt 250 lb (113.399 kg)  SpO2 96%  Physical Exam  Results for orders placed or performed in visit on 08/06/15  POCT urinalysis dipstick  Result Value Ref Range   Color, UA yellow    Clarity, UA clear    Glucose, UA Negative    Bilirubin, UA Negative    Ketones, UA Negative    Spec Grav, UA 1.010    Blood, UA Negative    pH, UA 7.5    Protein, UA Negative    Urobilinogen, UA 0.2    Nitrite, UA Negative    Leukocytes, UA small (1+) (A) Negative       Assessment & Plan:     1. Urinary frequency  - POCT urinalysis dipstick  2. Urinary tract infection without hematuria, site unspecified  - Urine Culture       Lelon Huh, MD  [facility]

## 2015-08-08 LAB — URINE CULTURE

## 2015-08-13 ENCOUNTER — Other Ambulatory Visit: Payer: Self-pay | Admitting: Family Medicine

## 2015-08-21 ENCOUNTER — Other Ambulatory Visit: Payer: Self-pay | Admitting: Family Medicine

## 2015-08-29 ENCOUNTER — Other Ambulatory Visit: Payer: Self-pay | Admitting: Family Medicine

## 2015-09-10 ENCOUNTER — Encounter: Payer: Self-pay | Admitting: Family Medicine

## 2015-09-10 ENCOUNTER — Ambulatory Visit (INDEPENDENT_AMBULATORY_CARE_PROVIDER_SITE_OTHER): Payer: Commercial Managed Care - HMO | Admitting: Family Medicine

## 2015-09-10 VITALS — BP 120/60 | HR 73 | Temp 98.0°F | Resp 16

## 2015-09-10 DIAGNOSIS — L719 Rosacea, unspecified: Secondary | ICD-10-CM

## 2015-09-10 DIAGNOSIS — R300 Dysuria: Secondary | ICD-10-CM | POA: Diagnosis not present

## 2015-09-10 DIAGNOSIS — M1990 Unspecified osteoarthritis, unspecified site: Secondary | ICD-10-CM

## 2015-09-10 DIAGNOSIS — R358 Other polyuria: Secondary | ICD-10-CM

## 2015-09-10 DIAGNOSIS — R739 Hyperglycemia, unspecified: Secondary | ICD-10-CM | POA: Diagnosis not present

## 2015-09-10 DIAGNOSIS — R3589 Other polyuria: Secondary | ICD-10-CM

## 2015-09-10 LAB — POCT URINALYSIS DIPSTICK
Bilirubin, UA: NEGATIVE
Glucose, UA: NEGATIVE
NITRITE UA: NEGATIVE
PH UA: 6
RBC UA: NEGATIVE
Spec Grav, UA: 1.02
UROBILINOGEN UA: 0.2

## 2015-09-10 LAB — GLUCOSE, POCT (MANUAL RESULT ENTRY): POC Glucose: 152 mg/dl — AB (ref 70–99)

## 2015-09-10 LAB — POCT GLYCOSYLATED HEMOGLOBIN (HGB A1C)
ESTIMATED AVERAGE GLUCOSE: 101
HEMOGLOBIN A1C: 5.1

## 2015-09-10 MED ORDER — TRAMADOL HCL 50 MG PO TABS: 50.0000 mg | ORAL_TABLET | Freq: Three times a day (TID) | ORAL | Status: DC | PRN

## 2015-09-10 MED ORDER — CIPROFLOXACIN HCL 500 MG PO TABS: 500.0000 mg | ORAL_TABLET | Freq: Two times a day (BID) | ORAL | Status: AC

## 2015-09-10 NOTE — Progress Notes (Signed)
Patient: Vicki Morales Female    DOB: 10-16-1940   75 y.o.   MRN: [id number] Visit Date: 09/10/2015  Today's Provider: Lelon Huh, MD   Chief Complaint  Patient presents with  . Follow-up   Subjective:    HPI  Follow-up for rosacea on face from 07/03/2015; given metronidazole 1 % gel.  Patient is also having burning upon urination and going to the bathroom frequently. She states she has been more thirsty and drinking a lot of water lately  She also states her arthritis pains are getting worse, especially in her knees. She a home exercise program for elderly that she had been doing which helps a bit. Tramadol helps, but she usually has to take 2 at a time.   Allergies  Allergen Reactions  . Acetaminophen Other (See Comments)  . Arthrotec  [Diclofenac-Misoprostol]   . Codeine   . Diclofenac Other (See Comments)  . Diclofenac-Misoprostol Other (See Comments)  . Hydrochlorothiazide     urinary incontinence  . Morphine   . Penicillin V Potassium Other (See Comments)  . Sulfa Antibiotics   . Tiotropium Bromide Monohydrate     Blurry vision  . Pantoprazole Rash   Previous Medications   ADVAIR DISKUS 250-50 MCG/DOSE AEPB    INHALE 1 PUFF INTO THE LUNGS EVERY 12 HOURS AS NEEDED   ALBUTEROL (PROVENTIL) (2.5 MG/3ML) 0.083% NEBULIZER SOLUTION    2.5 mg 2 (two) times daily.   AMLODIPINE (NORVASC) 5 MG TABLET    TAKE 1 TABLET BY MOUTH EVERY DAY   B COMPLEX VITAMINS (VITAMIN B COMPLEX PO)    Take 1 tablet by mouth daily.   BENZTROPINE (COGENTIN) 0.5 MG TABLET    Take 1 tablet (0.5 mg total) by mouth at bedtime.   BUTALBITAL-APAP-CAFFEINE 50-325-40 MG PER CAPSULE    Take 1 capsule by mouth every 4 (four) hours as needed.   CALCIUM CARBONATE-VITAMIN D 600-400 MG-UNIT PER TABLET    Take 1 tablet by mouth 2 (two) times daily.   CLONAZEPAM (KLONOPIN) 0.5 MG TABLET    Take 2 tablets (1 mg total) by mouth at bedtime.   CRANBERRY 250 MG CAPS    Take 1 capsule by mouth 2 (two) times  daily.   FAMOTIDINE (PEPCID) 10 MG TABLET    Take 10 mg by mouth at bedtime. Reported on 07/03/2015   FLUTICASONE-SALMETEROL (ADVAIR) 250-50 MCG/DOSE AEPB    Inhale 1 puff into the lungs every 12 (twelve) hours.   LORATADINE (CLARITIN) 10 MG TABLET    TAKE 1 TABLET BY MOUTH EVERY DAY   MAGNESIUM 250 MG TABS    Take 1 tablet by mouth at bedtime.    METRONIDAZOLE (METROGEL) 1 % GEL    Apply to affected area every evening   MISC NATURAL PRODUCTS (OSTEO BI-FLEX ADV JOINT SHIELD PO)    Take 1 tablet by mouth 2 (two) times daily.   MONTELUKAST (SINGULAIR) 10 MG TABLET    Take 1 tablet (10 mg total) by mouth daily.   MULTIPLE VITAMIN (MULTI-VITAMINS) TABS    Take 1 tablet by mouth daily.   OMEPRAZOLE (PRILOSEC OTC) 20 MG TABLET    Take 1 tablet by mouth daily.   OXYBUTYNIN (DITROPAN-XL) 10 MG 24 HR TABLET    TAKE 1 TABLET BY MOUTH AT BEDTIME   PROBIOTIC PRODUCT (ALIGN) 4 MG CAPS    Take 1 capsule by mouth daily.   SERTRALINE (ZOLOFT) 100 MG TABLET    Take 1 tablet (  100 mg total) by mouth daily.   THEO-24 400 MG 24 HR CAPSULE    TAKE 1 CAPSULE BY MOUTH DAILY   TIZANIDINE (ZANAFLEX) 4 MG TABLET    Take 1 tablet by mouth daily.   TRAMADOL (ULTRAM) 50 MG TABLET    Take 1 tablet (50 mg total) by mouth every 8 (eight) hours as needed.   TRAZODONE (DESYREL) 50 MG TABLET    Take 2 tablets (100 mg total) by mouth at bedtime.   TRIFLUOPERAZINE (STELAZINE) 10 MG TABLET    Take 1 tablet (10 mg total) by mouth at bedtime.    Review of Systems  Constitutional: Negative for fever, chills, appetite change and fatigue.  Respiratory: Negative for chest tightness and shortness of breath.   Cardiovascular: Negative for chest pain and palpitations.  Gastrointestinal: Negative for nausea, vomiting and abdominal pain.  Genitourinary: Positive for dysuria.  Neurological: Negative for dizziness and weakness.    Social History  Substance Use Topics  . Smoking status: Former Smoker -- 30 years  . Smokeless tobacco:  Never Used     Comment: quit in 1990's  . Alcohol Use: No   Objective:   BP 120/60 mmHg  Pulse 73  Temp(Src) 98 F (36.7 C) (Oral)  Resp 16  SpO2 97%  Physical Exam  General Appearance:    Alert, cooperative, no distress, obese  Eyes:    PERRL, conjunctiva/corneas clear, EOM's intact       Lungs:     Clear to auscultation bilaterally, respirations unlabored  Heart:    Regular rate and rhythm  Neurologic:   Awake, alert, oriented x 3. No apparent focal neurological           defect.       Results for orders placed or performed in visit on 09/10/15  POCT urinalysis dipstick  Result Value Ref Range   Color, UA Dark Yellow    Clarity, UA Cloudy    Glucose, UA Neg    Bilirubin, UA Neg    Ketones, UA Trace    Spec Grav, UA 1.020    Blood, UA Neg    pH, UA 6.0    Protein, UA Trace    Urobilinogen, UA 0.2    Nitrite, UA Neg    Leukocytes, UA small (1+) (A) Negative  POCT glucose (manual entry)  Result Value Ref Range   POC Glucose 152 (A) 70 - 99 mg/dl  POCT HgB A1C  Result Value Ref Range   Hemoglobin A1C 5.1    Est. average glucose Bld gHb Est-mCnc 101        Assessment & Plan:     1. Dysuria U/a unremarkable, but she she feels more tired and achy today like an infection is coming on. Will cover with Cipro an she is to call if not rapidly imroving.  - POCT urinalysis dipstick  2. Osteoarthritis, unspecified osteoarthritis type, unspecified site Patient Instructions  Try OTC Zostrix (capsaicin) on joint s for arthritis.    Advised she may continue to take 2 tramadol  3. Polyuria  - POCT glucose (manual entry)  4. Rosacea Responding well to metronidazole.   5. Blood glucose elevated She admits to eating sugar deserts this afternoon, and advised to avoid sweets.  - POCT HgB A1C       Lelon Huh, MD  [facility]

## 2015-09-10 NOTE — Patient Instructions (Signed)
Try OTC Zostrix (capsaicin) on joint s for arthritis.

## 2015-09-16 ENCOUNTER — Ambulatory Visit: Payer: Commercial Managed Care - HMO | Admitting: Psychiatry

## 2015-09-17 ENCOUNTER — Ambulatory Visit: Payer: Commercial Managed Care - HMO | Admitting: Psychiatry

## 2015-09-22 ENCOUNTER — Encounter: Payer: Self-pay | Admitting: *Deleted

## 2015-09-22 ENCOUNTER — Observation Stay
Admission: EM | Admit: 2015-09-22 | Discharge: 2015-09-23 | Disposition: A | Discharge status: Home / Self Care | Payer: Commercial Managed Care - HMO | Attending: Internal Medicine | Admitting: Internal Medicine

## 2015-09-22 ENCOUNTER — Emergency Department: Payer: Commercial Managed Care - HMO

## 2015-09-22 ENCOUNTER — Ambulatory Visit: Payer: Commercial Managed Care - HMO | Admitting: Psychiatry

## 2015-09-22 DIAGNOSIS — Z90710 Acquired absence of both cervix and uterus: Secondary | ICD-10-CM | POA: Diagnosis not present

## 2015-09-22 DIAGNOSIS — Z9049 Acquired absence of other specified parts of digestive tract: Secondary | ICD-10-CM | POA: Insufficient documentation

## 2015-09-22 DIAGNOSIS — Z823 Family history of stroke: Secondary | ICD-10-CM | POA: Diagnosis not present

## 2015-09-22 DIAGNOSIS — R0602 Shortness of breath: Secondary | ICD-10-CM

## 2015-09-22 DIAGNOSIS — Z818 Family history of other mental and behavioral disorders: Secondary | ICD-10-CM | POA: Insufficient documentation

## 2015-09-22 DIAGNOSIS — Z882 Allergy status to sulfonamides status: Secondary | ICD-10-CM | POA: Insufficient documentation

## 2015-09-22 DIAGNOSIS — F32A Depression, unspecified: Secondary | ICD-10-CM | POA: Diagnosis present

## 2015-09-22 DIAGNOSIS — R079 Chest pain, unspecified: Secondary | ICD-10-CM | POA: Diagnosis not present

## 2015-09-22 DIAGNOSIS — Z885 Allergy status to narcotic agent status: Secondary | ICD-10-CM | POA: Diagnosis not present

## 2015-09-22 DIAGNOSIS — R531 Weakness: Secondary | ICD-10-CM | POA: Diagnosis not present

## 2015-09-22 DIAGNOSIS — Z825 Family history of asthma and other chronic lower respiratory diseases: Secondary | ICD-10-CM | POA: Diagnosis not present

## 2015-09-22 DIAGNOSIS — J439 Emphysema, unspecified: Secondary | ICD-10-CM | POA: Diagnosis not present

## 2015-09-22 DIAGNOSIS — Z9889 Other specified postprocedural states: Secondary | ICD-10-CM | POA: Diagnosis not present

## 2015-09-22 DIAGNOSIS — I700 Atherosclerosis of aorta: Secondary | ICD-10-CM | POA: Diagnosis not present

## 2015-09-22 DIAGNOSIS — F419 Anxiety disorder, unspecified: Secondary | ICD-10-CM | POA: Diagnosis not present

## 2015-09-22 DIAGNOSIS — Z87891 Personal history of nicotine dependence: Secondary | ICD-10-CM | POA: Diagnosis not present

## 2015-09-22 DIAGNOSIS — Z880 Allergy status to penicillin: Secondary | ICD-10-CM | POA: Diagnosis not present

## 2015-09-22 DIAGNOSIS — Z803 Family history of malignant neoplasm of breast: Secondary | ICD-10-CM | POA: Insufficient documentation

## 2015-09-22 DIAGNOSIS — Z7951 Long term (current) use of inhaled steroids: Secondary | ICD-10-CM | POA: Insufficient documentation

## 2015-09-22 DIAGNOSIS — I499 Cardiac arrhythmia, unspecified: Secondary | ICD-10-CM | POA: Diagnosis not present

## 2015-09-22 DIAGNOSIS — I498 Other specified cardiac arrhythmias: Secondary | ICD-10-CM | POA: Diagnosis not present

## 2015-09-22 DIAGNOSIS — J449 Chronic obstructive pulmonary disease, unspecified: Secondary | ICD-10-CM | POA: Diagnosis not present

## 2015-09-22 DIAGNOSIS — Z79899 Other long term (current) drug therapy: Secondary | ICD-10-CM | POA: Insufficient documentation

## 2015-09-22 DIAGNOSIS — F329 Major depressive disorder, single episode, unspecified: Secondary | ICD-10-CM | POA: Diagnosis not present

## 2015-09-22 DIAGNOSIS — Z66 Do not resuscitate: Secondary | ICD-10-CM | POA: Insufficient documentation

## 2015-09-22 DIAGNOSIS — Z888 Allergy status to other drugs, medicaments and biological substances status: Secondary | ICD-10-CM | POA: Diagnosis not present

## 2015-09-22 DIAGNOSIS — I2510 Atherosclerotic heart disease of native coronary artery without angina pectoris: Secondary | ICD-10-CM | POA: Insufficient documentation

## 2015-09-22 DIAGNOSIS — I10 Essential (primary) hypertension: Secondary | ICD-10-CM | POA: Insufficient documentation

## 2015-09-22 DIAGNOSIS — R51 Headache: Secondary | ICD-10-CM | POA: Insufficient documentation

## 2015-09-22 DIAGNOSIS — R0789 Other chest pain: Principal | ICD-10-CM | POA: Diagnosis present

## 2015-09-22 DIAGNOSIS — R42 Dizziness and giddiness: Secondary | ICD-10-CM | POA: Insufficient documentation

## 2015-09-22 DIAGNOSIS — R918 Other nonspecific abnormal finding of lung field: Secondary | ICD-10-CM | POA: Diagnosis not present

## 2015-09-22 DIAGNOSIS — K219 Gastro-esophageal reflux disease without esophagitis: Secondary | ICD-10-CM | POA: Diagnosis present

## 2015-09-22 HISTORY — DX: Gastro-esophageal reflux disease without esophagitis: K21.9

## 2015-09-22 LAB — URINALYSIS COMPLETE WITH MICROSCOPIC (ARMC ONLY)
BILIRUBIN URINE: NEGATIVE
GLUCOSE, UA: NEGATIVE mg/dL
HGB URINE DIPSTICK: NEGATIVE
Ketones, ur: NEGATIVE mg/dL
NITRITE: NEGATIVE
PH: 8 (ref 5.0–8.0)
Protein, ur: NEGATIVE mg/dL
RBC / HPF: NONE SEEN RBC/hpf (ref 0–5)
SPECIFIC GRAVITY, URINE: 1.005 (ref 1.005–1.030)

## 2015-09-22 LAB — CBC WITH DIFFERENTIAL/PLATELET
BASOS ABS: 0 10*3/uL (ref 0–0.1)
BASOS PCT: 0 %
EOS ABS: 0 10*3/uL (ref 0–0.7)
Eosinophils Relative: 1 %
HEMATOCRIT: 44.7 % (ref 35.0–47.0)
Hemoglobin: 14.7 g/dL (ref 12.0–16.0)
Lymphocytes Relative: 24 %
Lymphs Abs: 1.4 10*3/uL (ref 1.0–3.6)
MCH: 30.6 pg (ref 26.0–34.0)
MCHC: 33 g/dL (ref 32.0–36.0)
MCV: 92.8 fL (ref 80.0–100.0)
MONO ABS: 0.4 10*3/uL (ref 0.2–0.9)
MONOS PCT: 7 %
NEUTROS ABS: 3.9 10*3/uL (ref 1.4–6.5)
Neutrophils Relative %: 68 %
PLATELETS: 159 10*3/uL (ref 150–440)
RBC: 4.82 MIL/uL (ref 3.80–5.20)
RDW: 14.3 % (ref 11.5–14.5)
WBC: 5.7 10*3/uL (ref 3.6–11.0)

## 2015-09-22 LAB — COMPREHENSIVE METABOLIC PANEL
ALBUMIN: 3.8 g/dL (ref 3.5–5.0)
ALK PHOS: 73 U/L (ref 38–126)
ALT: 14 U/L (ref 14–54)
ANION GAP: 6 (ref 5–15)
AST: 19 U/L (ref 15–41)
BUN: 19 mg/dL (ref 6–20)
CALCIUM: 9.7 mg/dL (ref 8.9–10.3)
CHLORIDE: 104 mmol/L (ref 101–111)
CO2: 26 mmol/L (ref 22–32)
Creatinine, Ser: 0.96 mg/dL (ref 0.44–1.00)
GFR calc Af Amer: >60 mL/min (ref >60)
GFR calc non Af Amer: 56 mL/min — ABNORMAL LOW (ref >60)
GLUCOSE: 99 mg/dL (ref 65–99)
POTASSIUM: 3.9 mmol/L (ref 3.5–5.1)
SODIUM: 136 mmol/L (ref 135–145)
Total Bilirubin: 0.5 mg/dL (ref 0.3–1.2)
Total Protein: 6.3 g/dL — ABNORMAL LOW (ref 6.5–8.1)

## 2015-09-22 LAB — BRAIN NATRIURETIC PEPTIDE: B NATRIURETIC PEPTIDE 5: 20 pg/mL (ref 0.0–100.0)

## 2015-09-22 LAB — TROPONIN I: Troponin I: <0.03 ng/mL (ref <0.031)

## 2015-09-22 MED ORDER — SODIUM CHLORIDE 0.9 % IV BOLUS (SEPSIS)
500.0000 mL | Freq: Once | INTRAVENOUS | Status: AC
  Administered 2015-09-22: 500 mL via INTRAVENOUS

## 2015-09-22 MED ORDER — HYDRALAZINE HCL 20 MG/ML IJ SOLN: 10.0000 mg | INTRAMUSCULAR | Status: DC | PRN

## 2015-09-22 MED ORDER — ASPIRIN 81 MG PO CHEW
324.0000 mg | CHEWABLE_TABLET | Freq: Once | ORAL | Status: AC
  Administered 2015-09-22: 324 mg via ORAL
  Filled 2015-09-22: qty 4

## 2015-09-22 NOTE — ED Provider Notes (Signed)
[facility] Emergency Department Provider Note   ____________________________________________  Time seen: I have reviewed the triage vital signs and the triage nursing note.  HISTORY  Chief Complaint Shortness of Breath   Historian Patient  HPI Vicki Morales is a 75 y.o. female with a history of obesity, hypertension, COPD, is here for initially complained of dizziness for several days. She reports this as associated with headache, which is global in nature. She's recently been treated with "Cipro "for a "viral infection", but states that she was feeling that even before the treatment.    Past Medical History  Diagnosis Date  . COPD (chronic obstructive pulmonary disease) ([place])   . Hypertension   . Depression   . Anxiety   . GERD (gastroesophageal reflux disease)     Patient Active Problem List   Diagnosis Date Noted  . Anxiety 09/22/2015  . Depression 09/22/2015  . Chest pressure 09/22/2015  . Osteoarthritis 09/10/2015  . Rosacea 07/03/2015  . Breast pain 11/07/2014  . Callus of foot 11/07/2014  . Weak pulse 11/07/2014  . CN (constipation) 11/07/2014  . Dermatitis, eczematoid 11/07/2014  . Diverticulosis of colon 11/07/2014  . Dizziness 11/07/2014  . Can't get food down 11/07/2014  . Accumulation of fluid in tissues 11/07/2014  . 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 Fatigue 11/07/2014  . Farts 11/07/2014  . FOM (frequency of micturition) 11/07/2014  . Tension type headache 11/07/2014  . LBP (low back pain) 11/07/2014  . Episodic mood disorder ([place]) 11/07/2014  . Extreme obesity ([place]) 11/07/2014  . Muscle ache 11/07/2014  . Disturbance of skin sensation 11/07/2014  . Awareness of heartbeats 11/07/2014  . Jerking 11/07/2014  . Body tinea 11/07/2014  . Essential (primary) hypertension 10/10/2014  . H/O: obesity 04/01/2014  . COPD, moderate ([place]) 04/01/2014  . Moderate COPD (chronic obstructive pulmonary  disease) ([place]) 04/01/2014  . 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 Malaise and fatigue 11/26/2008  . Aphasia 09/26/2008  . 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 GERD (gastroesophageal reflux disease) 06/07/1998  . HLD (hyperlipidemia) 06/07/1998  . HTN (hypertension) 06/07/1998  . OP (osteoporosis) 06/07/1998  . H/O total hysterectomy 03/08/1987  . Schizophrenia, in remission ([place]) 06/07/1978    Past Surgical History  Procedure Laterality Date  . Abdominal hysterectomy  1988  . Appendectomy  1988  . Tonsillectomy and adenoidectomy    . Trigger finger release  2004  . Esophagogastroduodenoscopy N/A 11/11/2014    Procedure: ESOPHAGOGASTRODUODENOSCOPY (EGD);  Surgeon: Josefine Class, MD;  Location: [facility] ENDOSCOPY;  Service: Endoscopy;  Laterality: N/A;    Current Outpatient Rx  Name  Route  Sig  Dispense  Refill  . acidophilus (RISAQUAD) CAPS capsule   Oral   Take 1 capsule by mouth daily.         [name] Kitchen amLODipine (NORVASC) 5 MG tablet   Oral   Take 5 mg by mouth at bedtime.          . benztropine (COGENTIN) 0.5 MG tablet   Oral   Take 1 tablet (0.5 mg total) by mouth at bedtime.   60 tablet   4   . butalbital-acetaminophen-caffeine (FIORICET, ESGIC) 50-325-40 MG tablet   Oral   Take 1 tablet by mouth every 4 (four) hours as needed for headache.         . Calcium Carbonate-Vitamin D (CALCIUM 600+D) 600-400 MG-UNIT tablet   Oral   Take 1 tablet by mouth 2 (two) times  daily.         . clonazePAM (KLONOPIN) 0.5 MG tablet   Oral   Take 2 tablets (1 mg total) by mouth at bedtime.   60 tablet   4   . Cranberry 250 MG CAPS   Oral   Take 250 mg by mouth daily.          . famotidine (PEPCID) 10 MG tablet   Oral   Take 10 mg by mouth at bedtime.          . Fluticasone-Salmeterol (ADVAIR) 250-50 MCG/DOSE AEPB   Inhalation   Inhale 1 puff into the lungs 2 (two) times daily.         [name] Kitchen glucosamine-chondroitin 500-400 MG  tablet   Oral   Take 1 tablet by mouth 2 (two) times daily.         [name] Kitchen loratadine (CLARITIN) 10 MG tablet   Oral   Take 10 mg by mouth daily.         . Magnesium Oxide 250 MG TABS   Oral   Take 500 mg by mouth at bedtime.         . montelukast (SINGULAIR) 10 MG tablet   Oral   Take 10 mg by mouth daily.          . Multiple Vitamin (MULTIVITAMIN WITH MINERALS) TABS tablet   Oral   Take 1 tablet by mouth daily.         [name] Kitchen oxybutynin (DITROPAN-XL) 10 MG 24 hr tablet   Oral   Take 10 mg by mouth at bedtime.         . sertraline (ZOLOFT) 100 MG tablet   Oral   Take 100 mg by mouth at bedtime.         . theophylline (UNIPHYL) 400 MG 24 hr tablet   Oral   Take 400 mg by mouth daily.         [name] Kitchen tiZANidine (ZANAFLEX) 4 MG tablet   Oral   Take 8 mg by mouth at bedtime as needed for muscle spasms.          . traMADol (ULTRAM) 50 MG tablet   Oral   Take 100 mg by mouth every 8 (eight) hours as needed for moderate pain.         . traZODone (DESYREL) 50 MG tablet   Oral   Take 2 tablets (100 mg total) by mouth at bedtime.   60 tablet   4   . trifluoperazine (STELAZINE) 10 MG tablet   Oral   Take 1 tablet (10 mg total) by mouth at bedtime.   30 tablet   4   . vitamin C (ASCORBIC ACID) 500 MG tablet   Oral   Take 2,000 mg by mouth 2 (two) times daily.           Allergies Arthrotec; Codeine; Hydrochlorothiazide; Morphine; Penicillins; Sulfa antibiotics; Tiotropium bromide monohydrate; and Pantoprazole  Family History  Problem Relation Age of Onset  . Breast cancer Sister   . Stroke Father   . Emphysema Father   . Anxiety disorder Father   . Depression Father   . Anxiety disorder Brother     Social History Social History  Substance Use Topics  . Smoking status: Former Smoker -- 30 years  . Smokeless tobacco: Never Used     Comment: quit in 1990's  . Alcohol Use: No    Review of Systems  Constitutional: Negative for fever. Eyes:  Negative for visual  changes. ENT: Negative for sore throat. Cardiovascular: positive for chest pain. No pleuritic chest pain. Respiratory: Negative for shortness of breath. Gastrointestinal: Negative for abdominal pain, vomiting and diarrhea. Genitourinary: Negative for dysuria. Musculoskeletal: Negative for back pain. Skin: Negative for rash. Neurological: Negative for headache. 10 point Review of Systems otherwise negative ____________________________________________   PHYSICAL EXAM:  VITAL SIGNS: ED Triage Vitals  Enc Vitals Group     BP 09/22/15 1841 172/74 mmHg     Pulse Rate 09/22/15 1841 73     Resp 09/22/15 1841 20     Temp 09/22/15 1841 98.1 F (36.7 C)     Temp Source 09/22/15 1841 Oral     SpO2 09/22/15 1841 98 %     Weight 09/22/15 1841 243 lb (110.224 kg)     Height 09/22/15 1841 5\' 3"  (1.6 m)     Head Cir --      Peak Flow --      Pain Score 09/22/15 2032 7     Pain Loc --      Pain Edu? --      Excl. in [place]? --      Constitutional: Alert and oriented. Well appearing and in no distress. HEENT   Head: Normocephalic and atraumatic.      Eyes: Conjunctivae are normal. PERRL. Normal extraocular movements.      Ears:         Nose: No congestion/rhinnorhea.   Mouth/Throat: Mucous membranes are moist.   Neck: No stridor. Cardiovascular/Chest: Normal rate, Regular irregular.  No murmurs, rubs, or gallops. Respiratory: Normal respiratory effort without tachypnea nor retractions. Breath sounds are clear and equal bilaterally. No wheezes/rales/rhonchi. Gastrointestinal: Soft. No distention, no guarding, no rebound. Nontender.  Obese  Genitourinary/rectal:Deferred Musculoskeletal: Nontender with normal range of motion in all extremities. No joint effusions.  No lower extremity tenderness.  No edema. Neurologic:  Normal speech and language. No gross or focal neurologic deficits are appreciated. Skin:  Skin is warm, dry and intact. No rash  noted. Psychiatric: Mood and affect are normal. Speech and behavior are normal. Patient exhibits appropriate insight and judgment.  ____________________________________________   EKG I, Lisa Roca, MD, the attending physician have personally viewed and interpreted all ECGs.  71 bpm. Normal sinus rhythm with PACs. Narrow QRS. Left axis deviation. Biphasic P wave. No ST segment elevation or ST segment depression or T-wave inversion. ____________________________________________  LABS (pertinent positives/negatives)  CBC within normal limits BNP 20 Conference metabolic panel without significant abnormalities Troponin less than 0.03  ____________________________________________  RADIOLOGY All Xrays were viewed by me. Imaging interpreted by Radiologist.  CT head noncontrast: No acute abnormality noted  Chest x-ray:  IMPRESSION: Questionable nodular lesion at the left heart border measuring approximately 1.5 cm. This area is incompletely visualize, but there is a rounded area along the left heart border not present previously. This finding is sufficiently concerning for potential nodular lesion as to warrant noncontrast enhanced chest CT to further evaluate. Lungs elsewhere clear. No change in cardiac silhouette otherwise.  Chest CT:  IMPRESSION: The opacity in the left base adjacent to the left heart border is due to localized fatty prominence as opposed to a mass.  There is no parenchymal lung edema or consolidation. There are small nodular opacities as described above, largest measuring 4 mm. No follow-up needed if patient is low-risk (and has no known or suspected primary neoplasm). Non-contrast chest CT can be considered in 12 months if patient is high-risk. This recommendation follows the consensus statement:  Guidelines for Management of Incidental Pulmonary Nodules Detected on CT Images:From the Fleischner Society 2017; published online before print  (10.1148/radiol.IJ:2314499).  Evidence of prior granulomatous disease. No adenopathy.  Foci of atherosclerotic calcification in the aorta. Minimal pericardial thickening. Scattered foci of coronary artery calcification noted.  Diffuse idiopathic skeletal hyperostosis. __________________________________________  PROCEDURES  Procedure(s) performed: None  Critical Care performed: None  ____________________________________________   ED COURSE / ASSESSMENT AND PLAN  Pertinent labs & imaging results that were available during my care of the patient were reviewed by me and considered in my medical decision making (see chart for details).   This patient is here with complaint of chest discomfort which is central and not pleuritic, pressure related. Her EKG shows no ischemic findings, and her initial troponin is negative, however I do feel she needs cardiac rule out given her age and risk factors. On her EKG she is also showing sinus rhythm with PACs, and on her rhythm strip she has frequent PVCs raising concern for some underlying stress or strain.  In terms of the dizziness, there is no focal neurologic deficit, but she is complaining of headache in addition to dizziness, so I did CT her head, and this shows no acute abnormality.  A urinalysis is being added on.  Patient lives alone and has become quite deconditioned. I think she might need physical therapy evaluation, and possible acute care rehabilitation placement if cardiac evaluation appears negative.     CONSULTATIONS:   Hospitalist for admission.   Patient / Family / Caregiver informed of clinical course, medical decision-making process, and agree with plan.    ___________________________________________   FINAL CLINICAL IMPRESSION(S) / ED DIAGNOSES   Final diagnoses:  Chest pain  Dizziness  Generalized weakness  Shortness of breath  Other cardiac arrhythmia              Note: This dictation was  prepared with Dragon dictation. Any transcriptional errors that result from this process are unintentional   Lisa Roca, MD 09/22/15 2145

## 2015-09-22 NOTE — ED Notes (Signed)
Per EMS report, patient c/o shortness of breath and mild chest pain since 1400 today. Patient now gives chest pain 0/10. EMS report capnography was 41, patient was 96% on room air. Patient has history of COPD. Patient report recently being on Cipro for 7 days and finished ABX 2 days ago.

## 2015-09-22 NOTE — H&P (Signed)
[facility] NAME: Vicki Morales    MR#:  [id number]  DATE OF BIRTH:  1940/12/04  DATE OF ADMISSION:  09/22/2015  PRIMARY CARE PHYSICIAN: Lelon Huh, MD   REQUESTING/REFERRING PHYSICIAN: Reita Cliche, MD  CHIEF COMPLAINT:   Chief Complaint  Patient presents with  . Shortness of Breath    HISTORY OF PRESENT ILLNESS:  Vicki Morales  is a 75 y.o. female who presents with increasing difficulty in moving around without dyspnea, and more specifically with an acute episode of chest pressure today. She states that the pressure was central, nonradiating, not associated with any nausea or vomiting, though was associated with some dizziness, shortness of breath, and orthopnea. She states she's never had anything like this before. The pressure has improved some, but it lasted all the way until she received treatment in the ED. On workup here her EKG shows some PACs, but no other acute ischemic findings. Initial cardiac enzyme was negative. Hospitalists were called for admission and further evaluation.  PAST MEDICAL HISTORY:   Past Medical History  Diagnosis Date  . COPD (chronic obstructive pulmonary disease) ([place])   . Hypertension   . Depression   . Anxiety   . GERD (gastroesophageal reflux disease)     PAST SURGICAL HISTORY:   Past Surgical History  Procedure Laterality Date  . Abdominal hysterectomy  1988  . Appendectomy  1988  . Tonsillectomy and adenoidectomy    . Trigger finger release  2004  . Esophagogastroduodenoscopy N/A 11/11/2014    Procedure: ESOPHAGOGASTRODUODENOSCOPY (EGD);  Surgeon: Josefine Class, MD;  Location: [facility] END[facility]COPY;  Service: Endoscopy;  Laterality: N/A;    SOCIAL HISTORY:   Social History  Substance Use Topics  . Smoking status: Former Smoker -- 30 years  . Smokeless tobacco: Never Used     Comment: quit in 1990's  . Alcohol Use: No    FAMILY HISTORY:   Family History  Problem Relation  Age of Onset  . Breast cancer Sister   . Stroke Father   . Emphysema Father   . Anxiety disorder Father   . Depression Father   . Anxiety disorder Brother     DRUG ALLERGIES:   Allergies  Allergen Reactions  . Arthrotec [Diclofenac-Misoprostol] Hives  . Codeine Other (See Comments)    Reaction:  Headache   . Hydrochlorothiazide Other (See Comments)    Reaction:  Weakness   . Morphine Hives  . Penicillins Hives and Other (See Comments)    Has patient had a PCN reaction causing immediate rash, facial/tongue/throat swelling, SOB or lightheadedness with hypotension: No Has patient had a PCN reaction causing severe rash involving mucus membranes or skin necrosis: No Has patient had a PCN reaction that required hospitalization No Has patient had a PCN reaction occurring within the last 10 years: No If all of the above answers are "NO", then may proceed with Cephalosporin use.  . Sulfa Antibiotics Hives  . Tiotropium Bromide Monohydrate Other (See Comments)    Reaction:  Blurry vision   . Pantoprazole Rash    MEDICATIONS AT HOME:   Prior to Admission medications   Medication Sig Start Date End Date Taking? Authorizing Provider  acidophilus (RISAQUAD) CAPS capsule Take 1 capsule by mouth daily.   Yes Historical Provider, MD  amLODipine (NORVASC) 5 MG tablet Take 5 mg by mouth at bedtime.    Yes Historical Provider, MD  benztropine (COGENTIN) 0.5 MG tablet Take 1 tablet (0.5 mg total)  by mouth at bedtime. 05/15/15  Yes Marjie Skiff, MD  butalbital-acetaminophen-caffeine (FIORICET, ESGIC) [phone] MG tablet Take 1 tablet by mouth every 4 (four) hours as needed for headache.   Yes Historical Provider, MD  Calcium Carbonate-Vitamin D (CALCIUM 600+D) 600-400 MG-UNIT tablet Take 1 tablet by mouth 2 (two) times daily.   Yes Historical Provider, MD  clonazePAM (KLONOPIN) 0.5 MG tablet Take 2 tablets (1 mg total) by mouth at bedtime. 05/15/15  Yes Marjie Skiff, MD  Cranberry 250 MG  CAPS Take 250 mg by mouth daily.    Yes Historical Provider, MD  famotidine (PEPCID) 10 MG tablet Take 10 mg by mouth at bedtime.    Yes Historical Provider, MD  Fluticasone-Salmeterol (ADVAIR) 250-50 MCG/DOSE AEPB Inhale 1 puff into the lungs 2 (two) times daily.   Yes Historical Provider, MD  glucosamine-chondroitin 500-400 MG tablet Take 1 tablet by mouth 2 (two) times daily.   Yes Historical Provider, MD  loratadine (CLARITIN) 10 MG tablet Take 10 mg by mouth daily.   Yes Historical Provider, MD  Magnesium Oxide 250 MG TABS Take 500 mg by mouth at bedtime.   Yes Historical Provider, MD  montelukast (SINGULAIR) 10 MG tablet Take 10 mg by mouth daily.    Yes Historical Provider, MD  Multiple Vitamin (MULTIVITAMIN WITH MINERALS) TABS tablet Take 1 tablet by mouth daily.   Yes Historical Provider, MD  oxybutynin (DITROPAN-XL) 10 MG 24 hr tablet Take 10 mg by mouth at bedtime.   Yes Historical Provider, MD  sertraline (ZOLOFT) 100 MG tablet Take 100 mg by mouth at bedtime.   Yes Historical Provider, MD  theophylline (UNIPHYL) 400 MG 24 hr tablet Take 400 mg by mouth daily.   Yes Historical Provider, MD  tiZANidine (ZANAFLEX) 4 MG tablet Take 8 mg by mouth at bedtime as needed for muscle spasms.    Yes Historical Provider, MD  traMADol (ULTRAM) 50 MG tablet Take 100 mg by mouth every 8 (eight) hours as needed for moderate pain.   Yes Historical Provider, MD  traZODone (DESYREL) 50 MG tablet Take 2 tablets (100 mg total) by mouth at bedtime. 05/15/15  Yes Marjie Skiff, MD  trifluoperazine (STELAZINE) 10 MG tablet Take 1 tablet (10 mg total) by mouth at bedtime. 05/15/15  Yes Marjie Skiff, MD  vitamin C (ASCORBIC ACID) 500 MG tablet Take 2,000 mg by mouth 2 (two) times daily.   Yes Historical Provider, MD    REVIEW OF SYSTEMS:  Review of Systems  Constitutional: Negative for fever, chills, weight loss and malaise/fatigue.  HENT: Negative for ear pain, hearing loss and tinnitus.   Eyes:  Negative for blurred vision, double vision, pain and redness.  Respiratory: Positive for shortness of breath. Negative for cough and hemoptysis.   Cardiovascular: Positive for chest pain and orthopnea. Negative for palpitations and leg swelling.  Gastrointestinal: Negative for nausea, vomiting, abdominal pain, diarrhea and constipation.  Genitourinary: Negative for dysuria, frequency and hematuria.  Musculoskeletal: Negative for back pain, joint pain and neck pain.  Skin:       No acne, rash, or lesions  Neurological: Negative for dizziness, tremors, focal weakness and weakness.  Endo/Heme/Allergies: Negative for polydipsia. Does not bruise/bleed easily.  Psychiatric/Behavioral: Negative for depression. The patient is not nervous/anxious and does not have insomnia.      VITAL SIGNS:   Filed Vitals:   09/22/15 1841 09/22/15 2032 09/22/15 2157  BP: 172/74 181/69 161/66  Pulse: 73 74 84  Temp: 98.1  F (36.7 C)    TempSrc: Oral    Resp: 20 18 18   Height: 5\' 3"  (1.6 m)    Weight: 110.224 kg (243 lb)    SpO2: 98% 99% 97%   Wt Readings from Last 3 Encounters:  09/22/15 110.224 kg (243 lb)  08/06/15 113.399 kg (250 lb)  07/03/15 113.853 kg (251 lb)    PHYSICAL EXAMINATION:  Physical Exam  Vitals reviewed. Constitutional: She is oriented to person, place, and time. She appears well-developed and well-nourished. No distress.  HENT:  Head: Normocephalic and atraumatic.  Mouth/Throat: Oropharynx is clear and moist.  Eyes: Conjunctivae and EOM are normal. Pupils are equal, round, and reactive to light. No scleral icterus.  Neck: Normal range of motion. Neck supple. No JVD present. No thyromegaly present.  Cardiovascular: Normal rate, regular rhythm and intact distal pulses.  Exam reveals no gallop and no friction rub.   No murmur heard. Respiratory: Effort normal and breath sounds normal. No respiratory distress. She has no wheezes. She has no rales.  GI: Soft. Bowel sounds are  normal. She exhibits no distension. There is no tenderness.  Musculoskeletal: Normal range of motion. She exhibits no edema.  No arthritis, no gout  Lymphadenopathy:    She has no cervical adenopathy.  Neurological: She is alert and oriented to person, place, and time. No cranial nerve deficit.  No dysarthria, no aphasia  Skin: Skin is warm and dry. No rash noted. No erythema.  Psychiatric: She has a normal mood and affect. Her behavior is normal. Judgment and thought content normal.    LABORATORY PANEL:   CBC  Recent Labs Lab 09/22/15 1847  WBC 5.7  HGB 14.7  HCT 44.7  PLT 159   ------------------------------------------------------------------------------------------------------------------  Chemistries   Recent Labs Lab 09/22/15 1847  NA 136  K 3.9  CL 104  CO2 26  GLUCOSE 99  BUN 19  CREATININE 0.96  CALCIUM 9.7  AST 19  ALT 14  ALKPHOS 73  BILITOT 0.5   ------------------------------------------------------------------------------------------------------------------  Cardiac Enzymes  Recent Labs Lab 09/22/15 1847  TROPONINI <0.03   ------------------------------------------------------------------------------------------------------------------  RADIOLOGY:  Dg Chest 2 View  09/22/2015  CLINICAL DATA:  Shortness of breath and chest pain for 1 day EXAM: CHEST  2 VIEW COMPARISON:  Nov 03, 2012 chest radiograph and chest CT November 06, 2012 FINDINGS: There is a questionable nodular opacity at the left heart border. This area is somewhat ill-defined but does appear rounded compared to the previous study at the left heart base. Lungs elsewhere clear. Heart size and pulmonary vascularity are normal. No adenopathy. There is degenerative change in the thoracic spine. IMPRESSION: Questionable nodular lesion at the left heart border measuring approximately 1.5 cm. This area is incompletely visualize, but there is a rounded area along the left heart border not present  previously. This finding is sufficiently concerning for potential nodular lesion as to warrant noncontrast enhanced chest CT to further evaluate. Lungs elsewhere clear. No change in cardiac silhouette otherwise. Electronically Signed   By: Lowella Grip III M.D.   On: 09/22/2015 19:25   Ct Head Wo Contrast  09/22/2015  CLINICAL DATA:  Headaches and dizziness EXAM: CT HEAD WITHOUT CONTRAST TECHNIQUE: Contiguous axial images were obtained from the base of the skull through the vertex without intravenous contrast. COMPARISON:  09/26/2008 FINDINGS: The bony calvarium is intact. No findings to suggest acute hemorrhage, acute infarction or space-occupying mass lesion are noted. IMPRESSION: No acute abnormality noted. Electronically Signed   By: [name]  Lukens M.D.   On: 09/22/2015 19:48   Ct Chest Wo Contrast  09/22/2015  CLINICAL DATA:  Question nodular lesion adjacent to left heart border EXAM: CT CHEST WITHOUT CONTRAST TECHNIQUE: Multidetector CT imaging of the chest was performed following the standard protocol without IV contrast. COMPARISON:  Chest radiograph September 22, 2015 FINDINGS: Mediastinum/Lymph Nodes: Thyroid appears normal. There is no appreciable thoracic adenopathy. Several lymph nodes demonstrate calcification consistent with prior granulomatous disease. There is minimal pericardial thickening. There are scattered foci of coronary artery calcification. Lungs/Pleura: There is prominence of the epicardial fat at the left heart border. There is no parenchymal lung nodular lesion in this area as was questioned on the chest radiographic examination. There is mild scarring in the lung bases. On axial slice 92 series 3, there is a 4 mm nodular opacity in the posterior segment of the left lower lobe. In the inferior lingula, there is a 4 mm nodular lesion seen on axial slice 90 series 3. There is no parenchymal lung edema or consolidation. Upper abdomen: In the visualized upper abdomen, there are  scattered calcified splenic granulomas as well as foci of calcification in the aorta and splenic artery. Visualized upper abdominal structures otherwise appear unremarkable. Musculoskeletal: There is degenerative change in the thoracic spine with diffuse idiopathic skeletal hyperostosis. There is arthropathy in each shoulder. There are no blastic or lytic bone lesions. IMPRESSION: The opacity in the left base adjacent to the left heart border is due to localized fatty prominence as opposed to a mass. There is no parenchymal lung edema or consolidation. There are small nodular opacities as described above, largest measuring 4 mm. No follow-up needed if patient is low-risk (and has no known or suspected primary neoplasm). Non-contrast chest CT can be considered in 12 months if patient is high-risk. This recommendation follows the consensus statement: Guidelines for Management of Incidental Pulmonary Nodules Detected on CT Images:From the Fleischner Society 2017; published online before print (10.1148/radiol.SG:5268862). Evidence of prior granulomatous disease.  No adenopathy. Foci of atherosclerotic calcification in the aorta. Minimal pericardial thickening. Scattered foci of coronary artery calcification noted. Diffuse idiopathic skeletal hyperostosis. Electronically Signed   By: Lowella Grip III M.D.   On: 09/22/2015 19:53    EKG:   Orders placed or performed during the hospital encounter of 09/22/15  . EKG 12-Lead  . EKG 12-Lead  . ED EKG  . ED EKG  . EKG 12-Lead  . EKG 12-Lead    IMPRESSION AND PLAN:  Principal Problem:   Chest pressure - EKG per my read had PACs without any overt ischemic finding. Telemetry monitor shows some PVCs as well. First cardiac enzyme negative. Patient's symptoms have improved somewhat with Nitropaste, though not completely resolved. We will admit her to telemetry, trend her cardiac enzymes tonight, get echocardiogram tomorrow as well as a cardiology consult. Active  Problems:   Chronic obstructive pulmonary emphysema (HCC) - currently stable, continue home inhalers and other meds.   HTN (hypertension) - was significantly elevated, now is mildly elevated. He will continue home meds for this as well as additional when necessary antihypertensives. Goal blood pressure less than 160/90   Anxiety - continue home anxiolytics   GERD (gastroesophageal reflux disease) - home dose H2 blocker   Depression - continue home meds  All the records are reviewed and case discussed with ED provider. Management plans discussed with the patient and/or family.  DVT PROPHYLAXIS: SubQ lovenox  GI PROPHYLAXIS: H2 blocker  ADMISSION STATUS: Observation  CODE STATUS:  DNR Code Status History    This patient does not have a recorded code status. Please follow your organizational policy for patients in this situation.    Advance Directive Documentation        Most Recent Value   Type of Advance Directive  Living will   Pre-existing out of facility DNR order (yellow form or pink MOST form)     "MOST" Form in Place?        TOTAL TIME TAKING CARE OF THIS PATIENT: 45 minutes.    Vicki Morales 09/22/2015, 10:52 PM  [facility]  Office  [phone]  CC: Primary care physician; Lelon Huh, MD

## 2015-09-23 ENCOUNTER — Observation Stay
Admit: 2015-09-23 | Discharge: 2015-09-23 | Disposition: A | Discharge status: Home / Self Care | Payer: Commercial Managed Care - HMO | Attending: Internal Medicine | Admitting: Internal Medicine

## 2015-09-23 ENCOUNTER — Ambulatory Visit: Payer: Self-pay | Admitting: Family Medicine

## 2015-09-23 DIAGNOSIS — R079 Chest pain, unspecified: Secondary | ICD-10-CM | POA: Diagnosis not present

## 2015-09-23 DIAGNOSIS — F419 Anxiety disorder, unspecified: Secondary | ICD-10-CM | POA: Diagnosis not present

## 2015-09-23 DIAGNOSIS — I10 Essential (primary) hypertension: Secondary | ICD-10-CM | POA: Diagnosis not present

## 2015-09-23 DIAGNOSIS — R0789 Other chest pain: Secondary | ICD-10-CM | POA: Diagnosis not present

## 2015-09-23 DIAGNOSIS — J439 Emphysema, unspecified: Secondary | ICD-10-CM | POA: Diagnosis not present

## 2015-09-23 LAB — CBC
HCT: 40.9 % (ref 35.0–47.0)
HCT: 43.4 % (ref 35.0–47.0)
HEMOGLOBIN: 14.3 g/dL (ref 12.0–16.0)
Hemoglobin: 13.8 g/dL (ref 12.0–16.0)
MCH: 30.1 pg (ref 26.0–34.0)
MCH: 30.7 pg (ref 26.0–34.0)
MCHC: 33 g/dL (ref 32.0–36.0)
MCHC: 33.7 g/dL (ref 32.0–36.0)
MCV: 91.1 fL (ref 80.0–100.0)
MCV: 91.4 fL (ref 80.0–100.0)
PLATELETS: 149 10*3/uL — AB (ref 150–440)
Platelets: 160 10*3/uL (ref 150–440)
RBC: 4.48 MIL/uL (ref 3.80–5.20)
RBC: 4.74 MIL/uL (ref 3.80–5.20)
RDW: 14.2 % (ref 11.5–14.5)
RDW: 14.3 % (ref 11.5–14.5)
WBC: 5.5 10*3/uL (ref 3.6–11.0)
WBC: 5.5 10*3/uL (ref 3.6–11.0)

## 2015-09-23 LAB — BASIC METABOLIC PANEL
Anion gap: 5 (ref 5–15)
BUN: 16 mg/dL (ref 6–20)
CALCIUM: 9 mg/dL (ref 8.9–10.3)
CO2: 25 mmol/L (ref 22–32)
CREATININE: 0.81 mg/dL (ref 0.44–1.00)
Chloride: 108 mmol/L (ref 101–111)
GFR calc Af Amer: >60 mL/min (ref >60)
GFR calc non Af Amer: >60 mL/min (ref >60)
Glucose, Bld: 83 mg/dL (ref 65–99)
Potassium: 3.5 mmol/L (ref 3.5–5.1)
Sodium: 138 mmol/L (ref 135–145)

## 2015-09-23 LAB — CREATININE, SERUM: CREATININE: 0.78 mg/dL (ref 0.44–1.00)

## 2015-09-23 LAB — TROPONIN I: Troponin I: <0.03 ng/mL (ref <0.031)

## 2015-09-23 LAB — ECHOCARDIOGRAM COMPLETE
Height: 63 in
Weight: 3859.2 oz

## 2015-09-23 LAB — MAGNESIUM: Magnesium: 1.9 mg/dL (ref 1.7–2.4)

## 2015-09-23 MED ORDER — THEOPHYLLINE ER 200 MG PO CP24
400.0000 mg | ORAL_CAPSULE | Freq: Every day | ORAL | Status: DC
  Administered 2015-09-23: 400 mg via ORAL
  Filled 2015-09-23: qty 2

## 2015-09-23 MED ORDER — TRAMADOL HCL 50 MG PO TABS
100.0000 mg | ORAL_TABLET | Freq: Three times a day (TID) | ORAL | Status: DC | PRN
  Administered 2015-09-23: 100 mg via ORAL
  Filled 2015-09-23: qty 2

## 2015-09-23 MED ORDER — SERTRALINE HCL 100 MG PO TABS
100.0000 mg | ORAL_TABLET | Freq: Every day | ORAL | Status: DC
  Administered 2015-09-23: 100 mg via ORAL
  Filled 2015-09-23: qty 1

## 2015-09-23 MED ORDER — ASPIRIN EC 81 MG PO TBEC: 81.0000 mg | DELAYED_RELEASE_TABLET | Freq: Every day | ORAL | Status: DC

## 2015-09-23 MED ORDER — AMLODIPINE BESYLATE 5 MG PO TABS
5.0000 mg | ORAL_TABLET | Freq: Every day | ORAL | Status: DC
Start: 2015-09-23 — End: 2015-09-23
  Administered 2015-09-23: 5 mg via ORAL
  Filled 2015-09-23: qty 1

## 2015-09-23 MED ORDER — LORATADINE 10 MG PO TABS
10.0000 mg | ORAL_TABLET | Freq: Every day | ORAL | Status: DC
  Administered 2015-09-23: 10 mg via ORAL
  Filled 2015-09-23 (×2): qty 1

## 2015-09-23 MED ORDER — TRIFLUOPERAZINE HCL 10 MG PO TABS
10.0000 mg | ORAL_TABLET | Freq: Every day | ORAL | Status: DC
  Filled 2015-09-23 (×2): qty 1

## 2015-09-23 MED ORDER — SODIUM CHLORIDE 0.9% FLUSH
3.0000 mL | Freq: Two times a day (BID) | INTRAVENOUS | Status: DC
  Administered 2015-09-23 (×2): 3 mL via INTRAVENOUS

## 2015-09-23 MED ORDER — CLONAZEPAM 1 MG PO TABS: 1.0000 mg | ORAL_TABLET | Freq: Every day | ORAL | Status: DC

## 2015-09-23 MED ORDER — BENZTROPINE MESYLATE 0.5 MG PO TABS
0.5000 mg | ORAL_TABLET | Freq: Every day | ORAL | Status: DC
  Administered 2015-09-23: 0.5 mg via ORAL
  Filled 2015-09-23 (×2): qty 1

## 2015-09-23 MED ORDER — THEOPHYLLINE ER 400 MG PO TB24: 400.0000 mg | ORAL_TABLET | Freq: Every day | ORAL | Status: DC

## 2015-09-23 MED ORDER — ACETAMINOPHEN 650 MG RE SUPP: 650.0000 mg | Freq: Four times a day (QID) | RECTAL | Status: DC | PRN

## 2015-09-23 MED ORDER — OXYBUTYNIN CHLORIDE ER 10 MG PO TB24
10.0000 mg | ORAL_TABLET | Freq: Every day | ORAL | Status: DC
  Filled 2015-09-23 (×2): qty 1

## 2015-09-23 MED ORDER — FAMOTIDINE 20 MG PO TABS: 10.0000 mg | ORAL_TABLET | Freq: Every day | ORAL | Status: DC

## 2015-09-23 MED ORDER — ENOXAPARIN SODIUM 40 MG/0.4ML ~~LOC~~ SOLN: 40.0000 mg | SUBCUTANEOUS | Status: DC

## 2015-09-23 MED ORDER — MOMETASONE FURO-FORMOTEROL FUM 200-5 MCG/ACT IN AERO
2.0000 | INHALATION_SPRAY | Freq: Two times a day (BID) | RESPIRATORY_TRACT | Status: DC
  Administered 2015-09-23 (×2): 2 via RESPIRATORY_TRACT
  Filled 2015-09-23: qty 8.8

## 2015-09-23 MED ORDER — MONTELUKAST SODIUM 10 MG PO TABS
10.0000 mg | ORAL_TABLET | Freq: Every day | ORAL | Status: DC
  Administered 2015-09-23: 10 mg via ORAL
  Filled 2015-09-23: qty 1

## 2015-09-23 MED ORDER — ACETAMINOPHEN 325 MG PO TABS: 650.0000 mg | ORAL_TABLET | Freq: Four times a day (QID) | ORAL | Status: DC | PRN

## 2015-09-23 MED ORDER — ASPIRIN EC 325 MG PO TBEC: 325.0000 mg | DELAYED_RELEASE_TABLET | Freq: Every day | ORAL | Status: DC

## 2015-09-23 MED ORDER — AMLODIPINE BESYLATE 10 MG PO TABS: 10.0000 mg | ORAL_TABLET | Freq: Every day | ORAL | Status: DC

## 2015-09-23 MED ORDER — ENOXAPARIN SODIUM 40 MG/0.4ML ~~LOC~~ SOLN: 40.0000 mg | Freq: Two times a day (BID) | SUBCUTANEOUS | Status: DC

## 2015-09-23 MED ORDER — ONDANSETRON HCL 4 MG PO TABS: 4.0000 mg | ORAL_TABLET | Freq: Four times a day (QID) | ORAL | Status: DC | PRN

## 2015-09-23 MED ORDER — TRAZODONE HCL 100 MG PO TABS
100.0000 mg | ORAL_TABLET | Freq: Every day | ORAL | Status: DC
  Administered 2015-09-23: 100 mg via ORAL
  Filled 2015-09-23: qty 1

## 2015-09-23 MED ORDER — ONDANSETRON HCL 4 MG/2ML IJ SOLN: 4.0000 mg | Freq: Four times a day (QID) | INTRAMUSCULAR | Status: DC | PRN

## 2015-09-23 NOTE — Progress Notes (Signed)
Anticoagulation monitoring(Lovenox):  75yo female ordered Lovenox 40 mg Q24h  Filed Weights   09/22/15 1841 09/23/15 0024  Weight: 243 lb (110.224 kg) 241 lb 3.2 oz (109.408 kg)   BMI 43.1  Lab Results  Component Value Date   CREATININE 0.81 09/23/2015   CREATININE 0.78 09/23/2015   CREATININE 0.96 09/22/2015   Estimated Creatinine Clearance: 70.1 mL/min (by C-G formula based on Cr of 0.81). Hemoglobin & Hematocrit     Component Value Date/Time   HGB 13.8 09/23/2015 0616   HGB 14.6 09/25/2014 1019   HCT 40.9 09/23/2015 0616   HCT 46.5 05/22/2015 1519   HCT 44.7 09/25/2014 1019     Per Protocol for Patient with estCrcl> 30 ml/min and BMI > 40, will transition to Lovenox 40 mg Q12h.     Paulina Fusi, PharmD, BCPS 09/23/2015 11:48 AM

## 2015-09-23 NOTE — Progress Notes (Signed)
Bedside report received, patient presently resting in the bed, denies any pain at this time. No chest pain or chest discomfort at this time. 1 assist to bathroom as needed.

## 2015-09-23 NOTE — Discharge Summary (Signed)
[facility] NAME: Vicki Morales    MR#:  [id number]  DATE OF BIRTH:  Nov 29, 1940  DATE OF ADMISSION:  09/22/2015 ADMITTING PHYSICIAN: Lance Coon, MD  DATE OF DISCHARGE: 09/23/2015  3:23 PM  PRIMARY CARE PHYSICIAN: Lelon Huh, MD    ADMISSION DIAGNOSIS:  Shortness of breath [R06.02] Dizziness [R42] Generalized weakness [R53.1] Chest pain [R07.9] Other cardiac arrhythmia [I49.8]   DISCHARGE DIAGNOSIS:   Atypical chest pain. SECONDARY DIAGNOSIS:   Past Medical History  Diagnosis Date  . COPD (chronic obstructive pulmonary disease) ([place])   . Hypertension   . Depression   . Anxiety   . GERD (gastroesophageal reflux disease)     HOSPITAL COURSE:    Atypical chest pain with Chest pressure.  Patient's symptoms have improved with Nitropaste. Negative troponin,  Echocardiogram: EF: 55% - 60%, normal study. Continue ASA and f/u Dr. Clayborn Bigness as outpatient.   Chronic obstructive pulmonary emphysema. stable, continue home inhalers and other meds.  HTN (hypertension), better controlled, continue home meds.  Anxiety - continue home anxiolytics  GERD (gastroesophageal reflux disease) - home dose H2 blocker  Depression - continue home meds  DISCHARGE CONDITIONS:   Stable, discharged to home.  CONSULTS OBTAINED:     DRUG ALLERGIES:   Allergies  Allergen Reactions  . Arthrotec [Diclofenac-Misoprostol] Hives  . Codeine Other (See Comments)    Reaction:  Headache   . Hydrochlorothiazide Other (See Comments)    Reaction:  Weakness   . Morphine Hives  . Penicillins Hives and Other (See Comments)    Has patient had a PCN reaction causing immediate rash, facial/tongue/throat swelling, SOB or lightheadedness with hypotension: No Has patient had a PCN reaction causing severe rash involving mucus membranes or skin necrosis: No Has patient had a PCN reaction that required hospitalization No Has patient had a PCN  reaction occurring within the last 10 years: No If all of the above answers are "NO", then may proceed with Cephalosporin use.  . Sulfa Antibiotics Hives  . Tiotropium Bromide Monohydrate Other (See Comments)    Reaction:  Blurry vision   . Pantoprazole Rash    DISCHARGE MEDICATIONS:   Discharge Medication List as of 09/23/2015 11:23 AM    START taking these medications   Details  aspirin EC 325 MG tablet Take 1 tablet (325 mg total) by mouth daily., Starting 09/23/2015, Until Discontinued, Print      CONTINUE these medications which have CHANGED   Details  amLODipine (NORVASC) 10 MG tablet Take 1 tablet (10 mg total) by mouth daily., Starting 09/23/2015, Until Discontinued, Print      CONTINUE these medications which have NOT CHANGED   Details  acidophilus (RISAQUAD) CAPS capsule Take 1 capsule by mouth daily., Until Discontinued, Historical Med    benztropine (COGENTIN) 0.5 MG tablet Take 1 tablet (0.5 mg total) by mouth at bedtime., Starting 05/15/2015, Until Discontinued, Normal    butalbital-acetaminophen-caffeine (FIORICET, ESGIC) 50-325-40 MG tablet Take 1 tablet by mouth every 4 (four) hours as needed for headache., Until Discontinued, Historical Med    Calcium Carbonate-Vitamin D (CALCIUM 600+D) 600-400 MG-UNIT tablet Take 1 tablet by mouth 2 (two) times daily., Until Discontinued, Historical Med    clonazePAM (KLONOPIN) 0.5 MG tablet Take 2 tablets (1 mg total) by mouth at bedtime., Starting 05/15/2015, Until Discontinued, Print    Cranberry 250 MG CAPS Take 250 mg by mouth daily. , Until Discontinued, Historical Med    famotidine (PEPCID) 10 MG  tablet Take 10 mg by mouth at bedtime. , Until Discontinued, Historical Med    Fluticasone-Salmeterol (ADVAIR) 250-50 MCG/DOSE AEPB Inhale 1 puff into the lungs 2 (two) times daily., Until Discontinued, Historical Med    glucosamine-chondroitin 500-400 MG tablet Take 1 tablet by mouth 2 (two) times daily., Until Discontinued,  Historical Med    loratadine (CLARITIN) 10 MG tablet Take 10 mg by mouth daily., Until Discontinued, Historical Med    Magnesium Oxide 250 MG TABS Take 500 mg by mouth at bedtime., Until Discontinued, Historical Med    montelukast (SINGULAIR) 10 MG tablet Take 10 mg by mouth daily. , Until Discontinued, Historical Med    Multiple Vitamin (MULTIVITAMIN WITH MINERALS) TABS tablet Take 1 tablet by mouth daily., Until Discontinued, Historical Med    oxybutynin (DITROPAN-XL) 10 MG 24 hr tablet Take 10 mg by mouth at bedtime., Until Discontinued, Historical Med    sertraline (ZOLOFT) 100 MG tablet Take 100 mg by mouth at bedtime., Until Discontinued, Historical Med    theophylline (UNIPHYL) 400 MG 24 hr tablet Take 400 mg by mouth daily., Until Discontinued, Historical Med    tiZANidine (ZANAFLEX) 4 MG tablet Take 8 mg by mouth at bedtime as needed for muscle spasms. , Until Discontinued, Historical Med    traMADol (ULTRAM) 50 MG tablet Take 100 mg by mouth every 8 (eight) hours as needed for moderate pain., Until Discontinued, Historical Med    traZODone (DESYREL) 50 MG tablet Take 2 tablets (100 mg total) by mouth at bedtime., Starting 05/15/2015, Until Discontinued, Normal    trifluoperazine (STELAZINE) 10 MG tablet Take 1 tablet (10 mg total) by mouth at bedtime., Starting 05/15/2015, Until Discontinued, Normal    vitamin C (ASCORBIC ACID) 500 MG tablet Take 2,000 mg by mouth 2 (two) times daily., Until Discontinued, Historical Med         DISCHARGE INSTRUCTIONS:    If you experience worsening of your admission symptoms, develop shortness of breath, life threatening emergency, suicidal or homicidal thoughts you must seek medical attention immediately by calling 911 or calling your MD immediately  if symptoms less severe.  You Must read complete instructions/literature along with all the possible adverse reactions/side effects for all the Medicines you take and that have been prescribed  to you. Take any new Medicines after you have completely understood and accept all the possible adverse reactions/side effects.   Please note  You were cared for by a hospitalist during your hospital stay. If you have any questions about your discharge medications or the care you received while you were in the hospital after you are discharged, you can call the unit and asked to speak with the hospitalist on call if the hospitalist that took care of you is not available. Once you are discharged, your primary care physician will handle any further medical issues. Please note that NO REFILLS for any discharge medications will be authorized once you are discharged, as it is imperative that you return to your primary care physician (or establish a relationship with a primary care physician if you do not have one) for your aftercare needs so that they can reassess your need for medications and monitor your lab values.    Today   SUBJECTIVE   No complaint.   VITAL SIGNS:  Blood pressure 127/47, pulse 70, temperature 97.6 F (36.4 C), temperature source Oral, resp. rate 18, height 5\' 3"  (1.6 m), weight 109.408 kg (241 lb 3.2 oz), SpO2 93 %.  I/O:   Intake/Output  Summary (Last 24 hours) at 09/23/15 1706 Last data filed at 09/23/15 1315  Gross per 24 hour  Intake    120 ml  Output    800 ml  Net   -680 ml    PHYSICAL EXAMINATION:  GENERAL:  75 y.o.-year-old patient lying in the bed with no acute distress. Morbid obese. EYES: Pupils equal, round, reactive to light and accommodation. No scleral icterus. Extraocular muscles intact.  HEENT: Head atraumatic, normocephalic. Oropharynx and nasopharynx clear.  NECK:  Supple, no jugular venous distention. No thyroid enlargement, no tenderness.  LUNGS: Normal breath sounds bilaterally, no wheezing, rales,rhonchi or crepitation. No use of accessory muscles of respiration.  CARDIOVASCULAR: S1, S2 normal. No murmurs, rubs, or gallops.  ABDOMEN: Soft,  non-tender, non-distended. Bowel sounds present. No organomegaly or mass.  EXTREMITIES: No pedal edema, cyanosis, or clubbing.  NEUROLOGIC: Cranial nerves II through XII are intact. Muscle strength 5/5 in all extremities. Sensation intact. Gait not checked.  PSYCHIATRIC: The patient is alert and oriented x 3.  SKIN: No obvious rash, lesion, or ulcer.   DATA REVIEW:   CBC  Recent Labs Lab 09/23/15 0616  WBC 5.5  HGB 13.8  HCT 40.9  PLT 149*    Chemistries   Recent Labs Lab 09/22/15 1847  09/23/15 0616  NA 136  --  138  K 3.9  --  3.5  CL 104  --  108  CO2 26  --  25  GLUCOSE 99  --  83  BUN 19  --  16  CREATININE 0.96  < > 0.81  CALCIUM 9.7  --  9.0  MG  --   --  1.9  AST 19  --   --   ALT 14  --   --   ALKPHOS 73  --   --   BILITOT 0.5  --   --   < > = values in this interval not displayed.  Cardiac Enzymes  Recent Labs Lab 09/23/15 1233  TROPONINI <0.03    Microbiology Results  Results for orders placed or performed in visit on 08/06/15  Urine Culture     Status: None   Collection Time: 08/06/15  3:36 PM  Result Value Ref Range Status   Urine Culture, Routine Final report  Final   Urine Culture result 1 Comment  Final    Comment: Mixed urogenital flora Less than 10,000 colonies/mL     RADIOLOGY:  Dg Chest 2 View  09/22/2015  CLINICAL DATA:  Shortness of breath and chest pain for 1 day EXAM: CHEST  2 VIEW COMPARISON:  Nov 03, 2012 chest radiograph and chest CT November 06, 2012 FINDINGS: There is a questionable nodular opacity at the left heart border. This area is somewhat ill-defined but does appear rounded compared to the previous study at the left heart base. Lungs elsewhere clear. Heart size and pulmonary vascularity are normal. No adenopathy. There is degenerative change in the thoracic spine. IMPRESSION: Questionable nodular lesion at the left heart border measuring approximately 1.5 cm. This area is incompletely visualize, but there is a rounded area  along the left heart border not present previously. This finding is sufficiently concerning for potential nodular lesion as to warrant noncontrast enhanced chest CT to further evaluate. Lungs elsewhere clear. No change in cardiac silhouette otherwise. Electronically Signed   By: Lowella Grip III M.D.   On: 09/22/2015 19:25   Ct Head Wo Contrast  09/22/2015  CLINICAL DATA:  Headaches and dizziness EXAM: CT  HEAD WITHOUT CONTRAST TECHNIQUE: Contiguous axial images were obtained from the base of the skull through the vertex without intravenous contrast. COMPARISON:  09/26/2008 FINDINGS: The bony calvarium is intact. No findings to suggest acute hemorrhage, acute infarction or space-occupying mass lesion are noted. IMPRESSION: No acute abnormality noted. Electronically Signed   By: Inez Catalina M.D.   On: 09/22/2015 19:48   Ct Chest Wo Contrast  09/22/2015  CLINICAL DATA:  Question nodular lesion adjacent to left heart border EXAM: CT CHEST WITHOUT CONTRAST TECHNIQUE: Multidetector CT imaging of the chest was performed following the standard protocol without IV contrast. COMPARISON:  Chest radiograph September 22, 2015 FINDINGS: Mediastinum/Lymph Nodes: Thyroid appears normal. There is no appreciable thoracic adenopathy. Several lymph nodes demonstrate calcification consistent with prior granulomatous disease. There is minimal pericardial thickening. There are scattered foci of coronary artery calcification. Lungs/Pleura: There is prominence of the epicardial fat at the left heart border. There is no parenchymal lung nodular lesion in this area as was questioned on the chest radiographic examination. There is mild scarring in the lung bases. On axial slice 92 series 3, there is a 4 mm nodular opacity in the posterior segment of the left lower lobe. In the inferior lingula, there is a 4 mm nodular lesion seen on axial slice 90 series 3. There is no parenchymal lung edema or consolidation. Upper abdomen: In the  visualized upper abdomen, there are scattered calcified splenic granulomas as well as foci of calcification in the aorta and splenic artery. Visualized upper abdominal structures otherwise appear unremarkable. Musculoskeletal: There is degenerative change in the thoracic spine with diffuse idiopathic skeletal hyperostosis. There is arthropathy in each shoulder. There are no blastic or lytic bone lesions. IMPRESSION: The opacity in the left base adjacent to the left heart border is due to localized fatty prominence as opposed to a mass. There is no parenchymal lung edema or consolidation. There are small nodular opacities as described above, largest measuring 4 mm. No follow-up needed if patient is low-risk (and has no known or suspected primary neoplasm). Non-contrast chest CT can be considered in 12 months if patient is high-risk. This recommendation follows the consensus statement: Guidelines for Management of Incidental Pulmonary Nodules Detected on CT Images:From the Fleischner Society 2017; published online before print (10.1148/radiol.IJ:2314499). Evidence of prior granulomatous disease.  No adenopathy. Foci of atherosclerotic calcification in the aorta. Minimal pericardial thickening. Scattered foci of coronary artery calcification noted. Diffuse idiopathic skeletal hyperostosis. Electronically Signed   By: Lowella Grip III M.D.   On: 09/22/2015 19:53        Management plans discussed with the patient, family and they are in agreement.  CODE STATUS:     Code Status Orders        Start     Ordered   09/23/15 0019  Do not attempt resuscitation (DNR)   Continuous    Question Answer Comment  In the event of cardiac or respiratory ARREST Do not call a "code blue"   In the event of cardiac or respiratory ARREST Do not perform Intubation, CPR, defibrillation or ACLS   In the event of cardiac or respiratory ARREST Use medication by any route, position, wound care, and other measures to  relive pain and suffering. May use oxygen, suction and manual treatment of airway obstruction as needed for comfort.      09/23/15 0018    Code Status History    Date Active Date Inactive Code Status Order ID Comments User Context  This patient has a current code status but no historical code status.    Advance Directive Documentation        Most Recent Value   Type of Advance Directive  Living will   Pre-existing out of facility DNR order (yellow form or pink MOST form)     "MOST" Form in Place?        TOTAL TIME TAKING CARE OF THIS PATIENT: 32 minutes.    Demetrios Loll M.D on 09/23/2015 at 5:06 PM  Between 7am to 6pm - Pager - [phone]  After 6pm go to [email or web address] - password [email or web address] [facility]  Office  [phone]  CC: Primary care physician; Lelon Huh, MD

## 2015-09-23 NOTE — Progress Notes (Signed)
*  PRELIMINARY RESULTS* Echocardiogram 2D Echocardiogram has been performed.  Vicki Morales 09/23/2015, 12:27 PM

## 2015-09-23 NOTE — Progress Notes (Signed)
Patient resting in the bed, possible discharge today after echo done. No distress noted.

## 2015-09-23 NOTE — Progress Notes (Signed)
MD Jannifer Franklin aware of 4 beat run of V-Tach. MD will order a Magnesium level on Pt.

## 2015-09-23 NOTE — Discharge Instructions (Signed)
Heart healthy diet. °Activity as tolerated. °

## 2015-09-25 ENCOUNTER — Ambulatory Visit: Payer: Commercial Managed Care - HMO | Admitting: Psychiatry

## 2015-09-26 ENCOUNTER — Encounter: Payer: Self-pay | Admitting: Family Medicine

## 2015-09-26 ENCOUNTER — Ambulatory Visit (INDEPENDENT_AMBULATORY_CARE_PROVIDER_SITE_OTHER): Payer: Commercial Managed Care - HMO | Admitting: Family Medicine

## 2015-09-26 VITALS — BP 140/66 | HR 73 | Temp 97.7°F | Resp 20 | Wt 249.0 lb

## 2015-09-26 DIAGNOSIS — R0789 Other chest pain: Secondary | ICD-10-CM

## 2015-09-26 DIAGNOSIS — I10 Essential (primary) hypertension: Secondary | ICD-10-CM | POA: Diagnosis not present

## 2015-09-26 DIAGNOSIS — F419 Anxiety disorder, unspecified: Secondary | ICD-10-CM | POA: Diagnosis not present

## 2015-09-26 NOTE — Progress Notes (Signed)
Patient: Vicki Morales Female    DOB: [birth year]   75 y.o.   MRN: [id number] Visit Date: 09/26/2015  Today's Provider: Lelon Huh, MD   Chief Complaint  Patient presents with  . Hospitalization Follow-up  . Chest Pain    follow up   Subjective:    HPI  Follow up Hospitalization/ Atypical Chest pain  Patient was admitted to [facility] on 09/22/2015 and discharged on 09/23/2015. She was treated for Atypical Chest pain. Treatment for this included Nitropaste while in the hospital, chest pain improved afterwards. Troonin x 3 was negative. Echocardiogram: EF: 55-60%. Normal study. Patient was to continue Aspirin and follow up with cardiology as outpatient. Amlodipine was also increased from 5mg  to 10mg  daily. She reports good compliance with treatment. She reports this condition is Improved. Patient has not had any more episodes of chest pain since being discharged from the hospital. Patient has an appointment to follow up with her Cardiologist Dr. Ubaldo Glassing on 10/16/2015 at 1:30pm. She feels pain was due to anxiety. She has follow up with psychologist next week.   ------------------------------------------------------------------------------------     Allergies  Allergen Reactions  . Arthrotec [Diclofenac-Misoprostol] Hives  . Codeine Other (See Comments)    Reaction:  Headache   . Hydrochlorothiazide Other (See Comments)    Reaction:  Weakness   . Morphine Hives  . Penicillins Hives and Other (See Comments)    Has patient had a PCN reaction causing immediate rash, facial/tongue/throat swelling, SOB or lightheadedness with hypotension: No Has patient had a PCN reaction causing severe rash involving mucus membranes or skin necrosis: No Has patient had a PCN reaction that required hospitalization No Has patient had a PCN reaction occurring within the last 10 years: No If all of the above answers are "NO", then may proceed with Cephalosporin use.  . Sulfa Antibiotics Hives  .  Tiotropium Bromide Monohydrate Other (See Comments)    Reaction:  Blurry vision   . Pantoprazole Rash   Previous Medications   ACIDOPHILUS (RISAQUAD) CAPS CAPSULE    Take 1 capsule by mouth daily.   AMLODIPINE (NORVASC) 10 MG TABLET    Take 1 tablet (10 mg total) by mouth daily.   ASPIRIN EC 325 MG TABLET    Take 1 tablet (325 mg total) by mouth daily.   BENZTROPINE (COGENTIN) 0.5 MG TABLET    Take 1 tablet (0.5 mg total) by mouth at bedtime.   BUTALBITAL-ACETAMINOPHEN-CAFFEINE (FIORICET, ESGIC) 50-325-40 MG TABLET    Take 1 tablet by mouth every 4 (four) hours as needed for headache.   CALCIUM CARBONATE-VITAMIN D (CALCIUM 600+D) 600-400 MG-UNIT TABLET    Take 1 tablet by mouth 2 (two) times daily.   CLONAZEPAM (KLONOPIN) 0.5 MG TABLET    Take 2 tablets (1 mg total) by mouth at bedtime.   CRANBERRY 250 MG CAPS    Take 250 mg by mouth daily.    FAMOTIDINE (PEPCID) 10 MG TABLET    Take 10 mg by mouth at bedtime.    FLUTICASONE-SALMETEROL (ADVAIR) 250-50 MCG/DOSE AEPB    Inhale 1 puff into the lungs 2 (two) times daily.   GLUCOSAMINE-CHONDROITIN 500-400 MG TABLET    Take 1 tablet by mouth 2 (two) times daily.   LORATADINE (CLARITIN) 10 MG TABLET    Take 10 mg by mouth daily.   MAGNESIUM OXIDE 250 MG TABS    Take 500 mg by mouth at bedtime.   MONTELUKAST (SINGULAIR) 10 MG TABLET  Take 10 mg by mouth daily.    MULTIPLE VITAMIN (MULTIVITAMIN WITH MINERALS) TABS TABLET    Take 1 tablet by mouth daily.   OXYBUTYNIN (DITROPAN-XL) 10 MG 24 HR TABLET    Take 10 mg by mouth at bedtime.   SERTRALINE (ZOLOFT) 100 MG TABLET    Take 100 mg by mouth at bedtime.   THEOPHYLLINE (UNIPHYL) 400 MG 24 HR TABLET    Take 400 mg by mouth daily.   TIZANIDINE (ZANAFLEX) 4 MG TABLET    Take 8 mg by mouth at bedtime as needed for muscle spasms.    TRAMADOL (ULTRAM) 50 MG TABLET    Take 100 mg by mouth every 8 (eight) hours as needed for moderate pain.   TRAZODONE (DESYREL) 50 MG TABLET    Take 2 tablets (100 mg total)  by mouth at bedtime.   TRIFLUOPERAZINE (STELAZINE) 10 MG TABLET    Take 1 tablet (10 mg total) by mouth at bedtime.   VITAMIN C (ASCORBIC ACID) 500 MG TABLET    Take 2,000 mg by mouth 2 (two) times daily.    Review of Systems  Constitutional: Positive for fatigue. Negative for fever, chills and appetite change.  HENT: Positive for sneezing.   Respiratory: Positive for cough and shortness of breath (with exertion). Negative for chest tightness and wheezing.   Cardiovascular: Positive for leg swelling. Negative for chest pain and palpitations.  Gastrointestinal: Negative for nausea, vomiting and abdominal pain.  Neurological: Negative for dizziness, weakness and light-headedness.  Psychiatric/Behavioral: The patient is nervous/anxious.     Social History  Substance Use Topics  . Smoking status: Former Smoker -- 30 years  . Smokeless tobacco: Never Used     Comment: quit in 1990's  . Alcohol Use: No   Objective:   BP 140/66 mmHg  Pulse 73  Temp(Src) 97.7 F (36.5 C) (Oral)  Resp 20  Wt 249 lb (112.946 kg)  SpO2 95%  Physical Exam  General Appearance:    Alert, cooperative, no distress, obese  Eyes:    PERRL, conjunctiva/corneas clear, EOM's intact       Lungs:     Clear to auscultation bilaterally, respirations unlabored  Heart:    Regular rate and rhythm  Neurologic:   Awake, alert, oriented x 3. No apparent focal neurological           defect.          Assessment & Plan:     .1. Chest pressure Now resolved. Rules out MI. Has cardiology follow up scheduled. Suspect episodes was secondary to anxeity  2. Anxiety She reports being more anxious lately. Is compliant with medication. Has follow up scheduled with psychiatrist next week.   3. Essential hypertension Tolerating increase dose of amlodipine. Continue current medications.  Follow up BP check in 3 months.        Lelon Huh, MD  [facility]

## 2015-09-29 ENCOUNTER — Telehealth: Payer: Self-pay

## 2015-09-29 NOTE — Telephone Encounter (Signed)
Pt advised as directed below.   Thanks,   -Laura  

## 2015-09-29 NOTE — Telephone Encounter (Signed)
Patient called concerned about her blood pressure readings. She states her blood pressure this morning was 171/65. She took it again 30 minutes later and it had gone up to 181/70. Patient then took her blood pressure medication (Amlodipine 10mg  ) and her anxiety medication. Ten minutes later she took her blood pressure again and it was 191/77. Patient denies any chest pain, swelling, numbness, or blurred vision. Patient thinks her blood pressure may be up because  she has been anxious and drank 8 oz glass of iced coffee today. Patient wants to know if she should make any changes or do anything different.

## 2015-09-29 NOTE — Telephone Encounter (Signed)
It takes a few hours for medication to bring her BP down. It should be down below 160 by now. She is probably right, anxiety and too much coffee will drive BP up. If BP is still above 160 she take an extra 1/2 tablet of amlodipine.

## 2015-09-30 ENCOUNTER — Telehealth: Payer: Self-pay | Admitting: Family Medicine

## 2015-09-30 NOTE — Telephone Encounter (Signed)
Patient has concerns of elevated blood pressure today. Patient states this morning before taking medication her blood pressure was 165/80, at 3Pm patient reports blood pressure was 181/80. Patient denied the following symptoms: light headiness, dizziness, sweats, fatigue, paresthesia of visual disturbances. Patient states that her heart range has been ranging from 70-75, I asked patient if she had been doing any exertion or had been under any stress that could have caused elevated blood pressure? Patient states that she has been crocheting and cleaning and rearranging her bathroom. I advised patient to stop all activity for now and try and lay back drinking a cold glass of water trying to relax her body from exertion and to contact office back within the next half hour to see if pressure has gone down. Patient understood. KW

## 2015-09-30 NOTE — Telephone Encounter (Signed)
Pt called back to let Wendelyn Breslow know she rested for 30 minutes and checked her BP. Her BP is 162/72. Thanks TNP

## 2015-10-01 ENCOUNTER — Ambulatory Visit (INDEPENDENT_AMBULATORY_CARE_PROVIDER_SITE_OTHER): Payer: Commercial Managed Care - HMO | Admitting: Psychiatry

## 2015-10-01 ENCOUNTER — Encounter: Payer: Self-pay | Admitting: Psychiatry

## 2015-10-01 VITALS — BP 138/78 | HR 92 | Temp 97.5°F | Ht 63.0 in | Wt 244.8 lb

## 2015-10-01 DIAGNOSIS — F209 Schizophrenia, unspecified: Secondary | ICD-10-CM | POA: Diagnosis not present

## 2015-10-01 MED ORDER — SERTRALINE HCL 100 MG PO TABS: 100.0000 mg | ORAL_TABLET | Freq: Every day | ORAL | Status: DC

## 2015-10-01 MED ORDER — TRAZODONE HCL 50 MG PO TABS: 100.0000 mg | ORAL_TABLET | Freq: Every day | ORAL | Status: DC

## 2015-10-01 MED ORDER — BENZTROPINE MESYLATE 0.5 MG PO TABS: 0.5000 mg | ORAL_TABLET | Freq: Every day | ORAL | Status: DC

## 2015-10-01 MED ORDER — TRIFLUOPERAZINE HCL 10 MG PO TABS: 10.0000 mg | ORAL_TABLET | Freq: Every day | ORAL | Status: DC

## 2015-10-01 NOTE — Progress Notes (Signed)
Patient ID: Vicki Morales, female   DOB: Dec 16, 1940, 75 y.o.   MRN: [id number] [facility] MD/PA/NP OP Progress Note  10/01/2015 1:48 PM ALESHIA HILDENBRANDT  MRN:  [id number]  Subjective:  Patient presents for follow-up of her schizophrenia. Patient was being seen by Dr.Williams previously and this is the first visit for this patient with this clinician. Patient reports doing well overall. Mood is stable. Denies any side effects from medications. Denies hearing voices or seeing things. Denies any anxiety . She lives with her daughter.  Chief Complaint: Doing well. Chief Complaint    Follow-up; Medication Refill     Visit Diagnosis:   No diagnosis found.  Past Medical History:  Past Medical History  Diagnosis Date  . COPD (chronic obstructive pulmonary disease) ([place])   . Hypertension   . Depression   . Anxiety   . GERD (gastroesophageal reflux disease)     Past Surgical History  Procedure Laterality Date  . Abdominal hysterectomy  1988  . Appendectomy  1988  . Tonsillectomy and adenoidectomy    . Trigger finger release  2004  . Esophagogastroduodenoscopy N/A 11/11/2014    Procedure: ESOPHAGOGASTRODUODENOSCOPY (EGD);  Surgeon: Josefine Class, MD;  Location: [facility] ENDOSCOPY;  Service: Endoscopy;  Laterality: N/A;   Family History:  Family History  Problem Relation Age of Onset  . Breast cancer Sister   . Stroke Father   . Emphysema Father   . Anxiety disorder Father   . Depression Father   . Anxiety disorder Brother    Social History:  Social History   Social History  . Marital Status: Widowed    Spouse Name: N/A  . Number of Children: N/A  . Years of Education: N/A   Social History Main Topics  . Smoking status: Former Smoker -- 30 years  . Smokeless tobacco: Never Used     Comment: quit in 1990's  . Alcohol Use: No  . Drug Use: No  . Sexual Activity: Not Currently   Other Topics Concern  . None   Social History Narrative   Additional History:   Assessment:    Musculoskeletal: Strength & Muscle Tone: within normal limits Gait & Station: Slow and walks with a cane Patient leans: N/A  Psychiatric Specialty Exam: HPI  Review of Systems  Psychiatric/Behavioral: Negative for depression, suicidal ideas, hallucinations, memory loss and substance abuse. The patient is not nervous/anxious and does not have insomnia.   All other systems reviewed and are negative.   Blood pressure 138/78, pulse 92, temperature 97.5 F (36.4 C), temperature source Tympanic, height 5\' 3"  (1.6 m), weight 244 lb 12.8 oz (111.041 kg), SpO2 92 %.Body mass index is 43.38 kg/(m^2).  General Appearance: Well Groomed  Eye Contact:  Good  Speech:  Normal Rate  Volume:  Normal  Mood:  Good  Affect:  Congruent Bright, smiling   Thought Process:  Linear and Logical  Orientation:  Full (Time, Place, and Person)  Thought Content:  Negative  Suicidal Thoughts:  No  Homicidal Thoughts:  No  Memory:  Immediate;   Good Recent;   Good Remote;   Good  Judgement:  Good  Insight:  Good  Psychomotor Activity:  Normal  Concentration:  Good  Recall:  Good  Fund of Knowledge: Good  Language: Good  Akathisia:  Negative  Handed:    AIMS (if indicated):  Mild cogwheeling noted on right arm   Assets:  Communication Skills Desire for Improvement  ADL's:  Intact  Cognition: WNL  Sleep:  Good states 12 midnight to 8am last night   Is the patient at risk to self?  No. Has the patient been a risk to self in the past 6 months?  No. Has the patient been a risk to self within the distant past?  Yes.  OD in 1979 Is the patient a risk to others?  No. Has the patient been a risk to others in the past 6 months?  No. Has the patient been a risk to others within the distant past?  No.  Current Medications: Current Outpatient Prescriptions  Medication Sig Dispense Refill  . acidophilus (RISAQUAD) CAPS capsule Take 1 capsule by mouth daily.    [name] Kitchen amLODipine (NORVASC) 10 MG tablet Take 1  tablet (10 mg total) by mouth daily. 30 tablet 0  . aspirin EC 325 MG tablet Take 1 tablet (325 mg total) by mouth daily. 30 tablet 2  . benztropine (COGENTIN) 0.5 MG tablet Take 1 tablet (0.5 mg total) by mouth at bedtime. 60 tablet 2  . butalbital-acetaminophen-caffeine (FIORICET, ESGIC) 50-325-40 MG tablet Take 1 tablet by mouth every 4 (four) hours as needed for headache.    . Calcium Carbonate-Vitamin D (CALCIUM 600+D) 600-400 MG-UNIT tablet Take 1 tablet by mouth 2 (two) times daily.    . Cranberry 250 MG CAPS Take 250 mg by mouth daily.     . famotidine (PEPCID) 10 MG tablet Take 10 mg by mouth at bedtime.     . Fluticasone-Salmeterol (ADVAIR) 250-50 MCG/DOSE AEPB Inhale 1 puff into the lungs 2 (two) times daily.    [name] Kitchen glucosamine-chondroitin 500-400 MG tablet Take 1 tablet by mouth 2 (two) times daily.    [name] Kitchen loratadine (CLARITIN) 10 MG tablet Take 10 mg by mouth daily.    . Magnesium Oxide 250 MG TABS Take 500 mg by mouth at bedtime.    . montelukast (SINGULAIR) 10 MG tablet Take 10 mg by mouth daily.     . Multiple Vitamin (MULTIVITAMIN WITH MINERALS) TABS tablet Take 1 tablet by mouth daily.    [name] Kitchen oxybutynin (DITROPAN-XL) 10 MG 24 hr tablet Take 10 mg by mouth at bedtime.    . sertraline (ZOLOFT) 100 MG tablet Take 1 tablet (100 mg total) by mouth at bedtime. 30 tablet 2  . THEO-24 400 MG 24 hr capsule TK 1 C PO D  6  . theophylline (UNIPHYL) 400 MG 24 hr tablet Take 400 mg by mouth daily.    [name] Kitchen tiZANidine (ZANAFLEX) 4 MG tablet Take 8 mg by mouth at bedtime as needed for muscle spasms.     . traMADol (ULTRAM) 50 MG tablet Take 100 mg by mouth every 8 (eight) hours as needed for moderate pain.    . traZODone (DESYREL) 50 MG tablet Take 2 tablets (100 mg total) by mouth at bedtime. 60 tablet 2  . trifluoperazine (STELAZINE) 10 MG tablet Take 1 tablet (10 mg total) by mouth at bedtime. 30 tablet 2  . vitamin C (ASCORBIC ACID) 500 MG tablet Take 2,000 mg by mouth 2 (two) times daily.     No  current facility-administered medications for this visit.    Medical Decision Making:  Established Problem, Stable/Improving (1) and Review of Medication Regimen & Side Effects (2)  Treatment Plan Summary:Medication management and Plan Plan Schizophrenia-  Continue her Stelazine 10 mg at bedtime, her sertraline at 100 mg daily, her trazodone at 100 mg at bedtime, her benztropine at 0.5 mg at bedtime  Decrease Klonopin to 0.5 mg at bedtime  for one month and then take as needed 2-3 times a week. Discussed with patient the risks with taking Klonopin on a daily basis with memory impairment and high risk of falls. Patient agreeable to this plan.  She will follow-up in 3 months or call before if needed  Prestina Raigoza 10/01/2015, 1:48 PM

## 2015-10-03 ENCOUNTER — Telehealth: Payer: Self-pay | Admitting: *Deleted

## 2015-10-03 MED ORDER — BENAZEPRIL HCL 10 MG PO TABS: 10.0000 mg | ORAL_TABLET | Freq: Every day | ORAL | Status: DC

## 2015-10-03 NOTE — Telephone Encounter (Signed)
Please have patient start benazepril 10mg  daily, #30, rf x 0. Follow up BP check 3-4 weeks.

## 2015-10-03 NOTE — Telephone Encounter (Signed)
Patient called concerning her Bp. Patient stated that her BP was 203/103 last night and 161/84 this morning. Patient denies any chest pain, swelling, numbness, or blurred vision. Patient states that the only symptom she is having is anxiousness about her bp being elevated. Patient has called concerning same issue with elevated bp twice in the last week. Patient was advised to take an extra 1/2 tab of amlodipine if bp is over 160. Patient also stated that she has an appt with Dr. Ubaldo Glassing Monday morning. Please advise?

## 2015-10-03 NOTE — Telephone Encounter (Signed)
Patient was notified. Rx sent to pharmacy.  

## 2015-10-06 ENCOUNTER — Other Ambulatory Visit: Payer: Self-pay | Admitting: Family Medicine

## 2015-10-07 ENCOUNTER — Encounter: Payer: Self-pay | Admitting: Family Medicine

## 2015-10-07 ENCOUNTER — Ambulatory Visit (INDEPENDENT_AMBULATORY_CARE_PROVIDER_SITE_OTHER): Payer: Commercial Managed Care - HMO | Admitting: Family Medicine

## 2015-10-07 VITALS — BP 140/94 | HR 90 | Temp 98.0°F | Resp 16 | Wt 247.0 lb

## 2015-10-07 DIAGNOSIS — I10 Essential (primary) hypertension: Secondary | ICD-10-CM | POA: Diagnosis not present

## 2015-10-07 DIAGNOSIS — R079 Chest pain, unspecified: Secondary | ICD-10-CM | POA: Diagnosis not present

## 2015-10-07 MED ORDER — BENAZEPRIL HCL 10 MG PO TABS: 5.0000 mg | ORAL_TABLET | Freq: Every day | ORAL | Status: DC

## 2015-10-07 MED ORDER — AMLODIPINE BESYLATE 10 MG PO TABS: 10.0000 mg | ORAL_TABLET | Freq: Every day | ORAL | Status: DC

## 2015-10-07 NOTE — Telephone Encounter (Signed)
Rx called in to pharmacy. 

## 2015-10-07 NOTE — Telephone Encounter (Signed)
Please call in butalbital.  

## 2015-10-07 NOTE — Progress Notes (Signed)
Patient: Vicki Morales Female    DOB: August 28, 1940   75 y.o.   MRN: [id number] Visit Date: 10/07/2015  Today's Provider: Lelon Huh, MD   Chief Complaint  Patient presents with  . Blood Pressure Check   Subjective:    HPI  Follow up hypertension.  Patient has been having elevated blood pressure. She was admitted to [facility] overnight on 4-17 with chest pain and dizziness and ruled our for MI. She was discharged with increased dose of amlodipine to 10mg  daily. She called last week reporting BP in 200s/ 100s so we called in prescription for benazepril 10mg . She states she took it for 2 days, but stopped because her blood pressure dropped too much and she felt dizzy. She was scheduled for a follow up with Dr. Ubaldo Glassing on 5/1, but she she couldn't make appointment. She has had no more chest pains, but her home blood pressures have been consistently in the 160s/90s. She states she feel dizzy when it gets higher than that.   Allergies  Allergen Reactions  . Arthrotec [Diclofenac-Misoprostol] Hives  . Codeine Other (See Comments)    Reaction:  Headache   . Hydrochlorothiazide Other (See Comments)    Reaction:  Weakness   . Morphine Hives  . Penicillins Hives and Other (See Comments)    Has patient had a PCN reaction causing immediate rash, facial/tongue/throat swelling, SOB or lightheadedness with hypotension: No Has patient had a PCN reaction causing severe rash involving mucus membranes or skin necrosis: No Has patient had a PCN reaction that required hospitalization No Has patient had a PCN reaction occurring within the last 10 years: No If all of the above answers are "NO", then may proceed with Cephalosporin use.  . Sulfa Antibiotics Hives  . Tiotropium Bromide Monohydrate Other (See Comments)    Reaction:  Blurry vision   . Pantoprazole Rash   Previous Medications   ACIDOPHILUS (RISAQUAD) CAPS CAPSULE    Take 1 capsule by mouth daily.   AMLODIPINE (NORVASC) 10 MG TABLET    Take  1 tablet (10 mg total) by mouth daily.   ASPIRIN EC 325 MG TABLET    Take 1 tablet (325 mg total) by mouth daily.   BENAZEPRIL (LOTENSIN) 10 MG TABLET    Take 1 tablet (10 mg total) by mouth daily.   BENZTROPINE (COGENTIN) 0.5 MG TABLET    Take 1 tablet (0.5 mg total) by mouth at bedtime.   BUTALBITAL-ACETAMINOPHEN-CAFFEINE (FIORICET, ESGIC) 50-325-40 MG TABLET    Take 1 tablet by mouth every 4 (four) hours as needed for headache.   BUTALBITAL-ACETAMINOPHEN-CAFFEINE (FIORICET, ESGIC) 50-325-40 MG TABLET    TAKE 1 TABLET BY MOUTH EVERY 4 HOURS AS NEEDED FOR HEADACHE   CALCIUM CARBONATE-VITAMIN D (CALCIUM 600+D) 600-400 MG-UNIT TABLET    Take 1 tablet by mouth 2 (two) times daily.   CRANBERRY 250 MG CAPS    Take 250 mg by mouth daily.    FAMOTIDINE (PEPCID) 10 MG TABLET    Take 10 mg by mouth at bedtime.    FLUTICASONE-SALMETEROL (ADVAIR) 250-50 MCG/DOSE AEPB    Inhale 1 puff into the lungs 2 (two) times daily.   GLUCOSAMINE-CHONDROITIN 500-400 MG TABLET    Take 1 tablet by mouth 2 (two) times daily.   LORATADINE (CLARITIN) 10 MG TABLET    Take 10 mg by mouth daily.   MAGNESIUM OXIDE 250 MG TABS    Take 500 mg by mouth at bedtime.   MONTELUKAST (SINGULAIR)  10 MG TABLET    Take 10 mg by mouth daily.    MULTIPLE VITAMIN (MULTIVITAMIN WITH MINERALS) TABS TABLET    Take 1 tablet by mouth daily.   OXYBUTYNIN (DITROPAN-XL) 10 MG 24 HR TABLET    Take 10 mg by mouth at bedtime.   SERTRALINE (ZOLOFT) 100 MG TABLET    Take 1 tablet (100 mg total) by mouth at bedtime.   THEO-24 400 MG 24 HR CAPSULE    TK 1 C PO D   THEOPHYLLINE (UNIPHYL) 400 MG 24 HR TABLET    Take 400 mg by mouth daily.   TIZANIDINE (ZANAFLEX) 4 MG TABLET    Take 8 mg by mouth at bedtime as needed for muscle spasms.    TRAMADOL (ULTRAM) 50 MG TABLET    Take 100 mg by mouth every 8 (eight) hours as needed for moderate pain.   TRAZODONE (DESYREL) 50 MG TABLET    Take 2 tablets (100 mg total) by mouth at bedtime.   TRIFLUOPERAZINE (STELAZINE)  10 MG TABLET    Take 1 tablet (10 mg total) by mouth at bedtime.   VITAMIN C (ASCORBIC ACID) 500 MG TABLET    Take 2,000 mg by mouth 2 (two) times daily.    Review of Systems  Constitutional: Positive for fatigue. Negative for fever, chills and appetite change.  Respiratory: Negative for chest tightness and shortness of breath.   Cardiovascular: Negative for chest pain and palpitations.  Gastrointestinal: Negative for nausea, vomiting and abdominal pain.  Neurological: Positive for dizziness and headaches. Negative for weakness.  Psychiatric/Behavioral: The patient is nervous/anxious.     Social History  Substance Use Topics  . Smoking status: Former Smoker -- 30 years  . Smokeless tobacco: Never Used     Comment: quit in 1990's  . Alcohol Use: No   Objective:   BP 140/94 mmHg  Pulse 90  Temp(Src) 98 F (36.7 C) (Oral)  Resp 16  Wt 247 lb (112.038 kg)  SpO2 93%  Physical Exam   General Appearance:    Alert, cooperative, no distress  Eyes:    PERRL, conjunctiva/corneas clear, EOM's intact       Lungs:     Clear to auscultation bilaterally, respirations unlabored  Heart:    Regular rate and rhythm  Neurologic:   Awake, alert, oriented x 3. No apparent focal neurological           defect.          Assessment & Plan:     1. Essential hypertension Labile BP. Will continue 10mg  amlodipine to take in the morning, and start back on lower dose of benazepril of 1/2 x 10mg  tablet every evening.  Return in about 4 weeks (around 11/04/2015).  2. Chest pain Ruled out for MI.  No pain since discharge. She is going to reschedule her appointment with Dr. Ubaldo Glassing.       Lelon Huh, MD  [facility]

## 2015-10-27 DIAGNOSIS — G4734 Idiopathic sleep related nonobstructive alveolar hypoventilation: Secondary | ICD-10-CM | POA: Diagnosis not present

## 2015-10-27 DIAGNOSIS — Z6841 Body Mass Index (BMI) 40.0 and over, adult: Secondary | ICD-10-CM | POA: Diagnosis not present

## 2015-10-27 DIAGNOSIS — I10 Essential (primary) hypertension: Secondary | ICD-10-CM | POA: Diagnosis not present

## 2015-10-27 DIAGNOSIS — J449 Chronic obstructive pulmonary disease, unspecified: Secondary | ICD-10-CM | POA: Diagnosis not present

## 2015-10-27 DIAGNOSIS — R0789 Other chest pain: Secondary | ICD-10-CM | POA: Diagnosis not present

## 2015-10-28 ENCOUNTER — Ambulatory Visit: Payer: Self-pay | Admitting: Family Medicine

## 2015-10-29 ENCOUNTER — Encounter: Payer: Self-pay | Admitting: Psychiatry

## 2015-10-29 ENCOUNTER — Ambulatory Visit (INDEPENDENT_AMBULATORY_CARE_PROVIDER_SITE_OTHER): Payer: Commercial Managed Care - HMO | Admitting: Psychiatry

## 2015-10-29 VITALS — BP 122/78 | HR 80 | Temp 99.1°F | Wt 242.8 lb

## 2015-10-29 DIAGNOSIS — F209 Schizophrenia, unspecified: Secondary | ICD-10-CM

## 2015-10-29 MED ORDER — HYDROXYZINE PAMOATE 25 MG PO CAPS: 25.0000 mg | ORAL_CAPSULE | Freq: Two times a day (BID) | ORAL | Status: DC | PRN

## 2015-10-29 NOTE — Progress Notes (Signed)
Patient ID: Vicki Morales, female   DOB: [birth year], 75 y.o.   MRN: [id number]  [facility] MD/PA/NP OP Progress Note  10/29/2015 1:48 PM Vicki Morales  MRN:  [id number]  Subjective:  Patient presents for follow-up of her schizophrenia. Patient presents with her daughter today. States that she has been doing well in terms of schizophrenia but having anxiety. States that she tried to taper the Klonopin to 1 tablet daily but having some anxiety since she also takes sometimes in the daytime. Her daughter states that patient is doing okay overall except for the anxiety. We discussed switching her to Vistaril insert off the Klonopin. Discussed all the side effects of Klonopin at her age. Both daughter and patient are agreeable to this plan. Mood is stable. Denies any side effects from medications. Denies hearing voices or seeing things. Denies any anxiety .   Chief Complaint: Doing well. Chief Complaint    Follow-up; Medication Refill; Medication Problem     Visit Diagnosis:     ICD-9-CM ICD-10-CM   1. Schizophrenia, unspecified type ([place])  F20.9     Past Medical History:  Past Medical History  Diagnosis Date  . COPD (chronic obstructive pulmonary disease) ([place])   . Hypertension   . Depression   . Anxiety   . GERD (gastroesophageal reflux disease)     Past Surgical History  Procedure Laterality Date  . Abdominal hysterectomy  1988  . Appendectomy  1988  . Tonsillectomy and adenoidectomy    . Trigger finger release  2004  . Esophagogastroduodenoscopy N/A 11/11/2014    Procedure: ESOPHAGOGASTRODUODENOSCOPY (EGD);  Surgeon: Josefine Class, MD;  [facility] ENDOSCOPY;  Service: Endoscopy;  Laterality: N/A;   Family History:  Family History  Problem Relation Age of Onset  . Breast cancer Sister   . Stroke Father   . Emphysema Father   . Anxiety disorder Father   . Depression Father   . Anxiety disorder Brother    Social History:  Social History   Social History  . Marital  Status: Widowed    Spouse Name: N/A  . Number of Children: N/A  . Years of Education: N/A   Social History Main Topics  . Smoking status: Former Smoker -- 30 years  . Smokeless tobacco: Never Used     Comment: quit in 1990's  . Alcohol Use: No  . Drug Use: No  . Sexual Activity: Not Currently   Other Topics Concern  . None   Social History Narrative   Additional History:   Assessment:   Musculoskeletal: Strength & Muscle Tone: within normal limits Gait & Station: Slow and walks with a cane Patient leans: N/A  Psychiatric Specialty Exam: HPI  Review of Systems  Psychiatric/Behavioral: Negative for depression, suicidal ideas, hallucinations, memory loss and substance abuse. The patient is not nervous/anxious and does not have insomnia.   All other systems reviewed and are negative.   Blood pressure 122/78, pulse 80, temperature 99.1 F (37.3 C), temperature source Tympanic, weight 242 lb 12.8 oz (110.133 kg), SpO2 93 %.Body mass index is 43.02 kg/(m^2).  General Appearance: Well Groomed  Eye Contact:  Good  Speech:  Normal Rate  Volume:  Normal  Mood:  Good  Affect:  Congruent Bright, smiling   Thought Process:  Linear and Logical  Orientation:  Full (Time, Place, and Person)  Thought Content:  Negative  Suicidal Thoughts:  No  Homicidal Thoughts:  No  Memory:  Immediate;   Good Recent;   Good Remote;  Good  Judgement:  Good  Insight:  Good  Psychomotor Activity:  Normal  Concentration:  Good  Recall:  Good  Fund of Knowledge: Good  Language: Good  Akathisia:  Negative  Handed:    AIMS (if indicated):  Mild cogwheeling noted on right arm   Assets:  Communication Skills Desire for Improvement  ADL's:  Intact  Cognition: WNL  Sleep:  Good states 12 midnight to 8am last night   Is the patient at risk to self?  No. Has the patient been a risk to self in the past 6 months?  No. Has the patient been a risk to self within the distant past?  Yes.  OD in  1979 Is the patient a risk to others?  No. Has the patient been a risk to others in the past 6 months?  No. Has the patient been a risk to others within the distant past?  No.  Current Medications: Current Outpatient Prescriptions  Medication Sig Dispense Refill  . acidophilus (RISAQUAD) CAPS capsule Take 1 capsule by mouth daily.    [name] Kitchen. amLODipine (NORVASC) 10 MG tablet Take 1 tablet (10 mg total) by mouth daily. 30 tablet 3  . aspirin EC 325 MG tablet Take 1 tablet (325 mg total) by mouth daily. 30 tablet 2  . benazepril (LOTENSIN) 10 MG tablet Take 0.5 tablets (5 mg total) by mouth daily. 1 tablet 0  . benztropine (COGENTIN) 0.5 MG tablet Take 1 tablet (0.5 mg total) by mouth at bedtime. 60 tablet 2  . butalbital-acetaminophen-caffeine (FIORICET, ESGIC) 50-325-40 MG tablet TAKE 1 TABLET BY MOUTH EVERY 4 HOURS AS NEEDED FOR HEADACHE 30 tablet 0  . Calcium Carbonate-Vitamin D (CALCIUM 600+D) 600-400 MG-UNIT tablet Take 1 tablet by mouth 2 (two) times daily.    . Cranberry 250 MG CAPS Take 250 mg by mouth daily.     . famotidine (PEPCID) 10 MG tablet Take 10 mg by mouth at bedtime.     . Fluticasone-Salmeterol (ADVAIR) 250-50 MCG/DOSE AEPB Inhale 1 puff into the lungs 2 (two) times daily.    [name] Kitchen. glucosamine-chondroitin 500-400 MG tablet Take 1 tablet by mouth 2 (two) times daily.    [name] Kitchen. loratadine (CLARITIN) 10 MG tablet Take 10 mg by mouth daily.    . Magnesium Oxide 250 MG TABS Take 500 mg by mouth at bedtime.    . montelukast (SINGULAIR) 10 MG tablet Take 10 mg by mouth daily.     . Multiple Vitamin (MULTIVITAMIN WITH MINERALS) TABS tablet Take 1 tablet by mouth daily.    [name] Kitchen. oxybutynin (DITROPAN-XL) 10 MG 24 hr tablet Take 10 mg by mouth at bedtime.    . sertraline (ZOLOFT) 100 MG tablet Take 1 tablet (100 mg total) by mouth at bedtime. 30 tablet 2  . THEO-24 400 MG 24 hr capsule TK 1 C PO D  6  . theophylline (UNIPHYL) 400 MG 24 hr tablet Take 400 mg by mouth daily.    [name] Kitchen. tiZANidine (ZANAFLEX)  4 MG tablet Take 8 mg by mouth at bedtime as needed for muscle spasms.     . traMADol (ULTRAM) 50 MG tablet Take 100 mg by mouth every 8 (eight) hours as needed for moderate pain.    . traZODone (DESYREL) 50 MG tablet Take 2 tablets (100 mg total) by mouth at bedtime. 60 tablet 2  . trifluoperazine (STELAZINE) 10 MG tablet Take 1 tablet (10 mg total) by mouth at bedtime. 30 tablet 2  . vitamin C (ASCORBIC ACID) 500  MG tablet Take 2,000 mg by mouth 2 (two) times daily.     No current facility-administered medications for this visit.    Medical Decision Making:  Established Problem, Stable/Improving (1) and Review of Medication Regimen & Side Effects (2)  Treatment Plan Summary:Medication management and Plan Plan Schizophrenia-  Continue her Stelazine 10 mg at bedtime, her sertraline at 100 mg daily, her trazodone at 100 mg at bedtime, her benztropine at 0.5 mg at bedtime  Discontinue Klonopin, patient has been taking 0.5 mg daily as needed for anxiety.. Start Vistaril at 25 mg to take 1-2 times daily as needed for anxiety  She will follow-up in 1 months or call before if needed  Kohei Antonellis 10/29/2015, 1:48 PM

## 2015-11-04 DIAGNOSIS — R0789 Other chest pain: Secondary | ICD-10-CM | POA: Diagnosis not present

## 2015-11-05 ENCOUNTER — Ambulatory Visit: Payer: No Typology Code available for payment source | Admitting: Psychiatry

## 2015-11-05 ENCOUNTER — Other Ambulatory Visit: Payer: Self-pay | Admitting: Family Medicine

## 2015-11-07 ENCOUNTER — Ambulatory Visit: Payer: Self-pay | Admitting: Family Medicine

## 2015-11-11 ENCOUNTER — Other Ambulatory Visit: Payer: Self-pay | Admitting: Family Medicine

## 2015-11-11 DIAGNOSIS — I272 Pulmonary hypertension, unspecified: Secondary | ICD-10-CM

## 2015-11-14 ENCOUNTER — Encounter: Payer: Self-pay | Admitting: Family Medicine

## 2015-11-14 ENCOUNTER — Ambulatory Visit (INDEPENDENT_AMBULATORY_CARE_PROVIDER_SITE_OTHER): Payer: Commercial Managed Care - HMO | Admitting: Family Medicine

## 2015-11-14 VITALS — BP 132/62 | HR 76 | Temp 97.6°F | Resp 18

## 2015-11-14 DIAGNOSIS — I10 Essential (primary) hypertension: Secondary | ICD-10-CM | POA: Diagnosis not present

## 2015-11-14 DIAGNOSIS — F419 Anxiety disorder, unspecified: Secondary | ICD-10-CM | POA: Diagnosis not present

## 2015-11-14 MED ORDER — BENAZEPRIL HCL 5 MG PO TABS: 5.0000 mg | ORAL_TABLET | Freq: Every day | ORAL | Status: DC

## 2015-11-14 MED ORDER — HYDROXYZINE HCL 10 MG PO TABS: 10.0000 mg | ORAL_TABLET | Freq: Two times a day (BID) | ORAL | Status: DC | PRN

## 2015-11-14 NOTE — Progress Notes (Signed)
Patient: Vicki Morales Female    DOB: [birth year]   75 y.o.   MRN: [id number] Visit Date: 11/14/2015  Today's Provider: Lelon Huh, MD   Chief Complaint  Patient presents with  . Hypertension    4 week follow up   Subjective:    HPI  Hypertension, follow-up:  BP Readings from Last 3 Encounters:  10/29/15 122/78  10/07/15 140/94  10/01/15 138/78    She was last seen for hypertension 1 months ago.  BP at that visit was 140/94. Management since that visit includes starting back on lower dose of Benazepril 10mg  1/2 tablet every evening. Patient was to also continue Amlodipine 10mg  daily. She reports good compliance with treatment. She is having side effects.       Weight trend: stable Wt Readings from Last 3 Encounters:  10/29/15 242 lb 12.8 oz (110.133 kg)  10/07/15 247 lb (112.038 kg)  10/01/15 244 lb 12.8 oz (111.041 kg)    ------------------------------------------------------------------------  Anxiety: Her psychiatrist has recently starting weaning clonazepam and start 25mg  vistaril for anxiety. She states she feels very fatigued and dizzy which she thinks is from the new medication. Her anxiety is about the same.   She also states she feels weaker in her right hand and she sometimes has mild tremor at rest.     Allergies  Allergen Reactions  . Arthrotec [Diclofenac-Misoprostol] Hives  . Codeine Other (See Comments)    Reaction:  Headache   . Hydrochlorothiazide Other (See Comments)    Reaction:  Weakness   . Morphine Hives  . Penicillins Hives and Other (See Comments)    Has patient had a PCN reaction causing immediate rash, facial/tongue/throat swelling, SOB or lightheadedness with hypotension: No Has patient had a PCN reaction causing severe rash involving mucus membranes or skin necrosis: No Has patient had a PCN reaction that required hospitalization No Has patient had a PCN reaction occurring within the last 10 years: No If all of the  above answers are "NO", then may proceed with Cephalosporin use.  . Sulfa Antibiotics Hives  . Tiotropium Bromide Monohydrate Other (See Comments)    Reaction:  Blurry vision   . Pantoprazole Rash   Current Meds  Medication Sig  . acidophilus (RISAQUAD) CAPS capsule Take 1 capsule by mouth daily.  [name] Kitchen amLODipine (NORVASC) 10 MG tablet Take 1 tablet (10 mg total) by mouth daily.  [name] Kitchen aspirin EC 325 MG tablet Take 1 tablet (325 mg total) by mouth daily.  . benazepril (LOTENSIN) 10 MG tablet Take 0.5 tablets (5 mg total) by mouth daily.  . benztropine (COGENTIN) 0.5 MG tablet Take 1 tablet (0.5 mg total) by mouth at bedtime.  . butalbital-acetaminophen-caffeine (FIORICET, ESGIC) 50-325-40 MG tablet TAKE 1 TABLET BY MOUTH EVERY 4 HOURS AS NEEDED FOR HEADACHE  . Calcium Carbonate-Vitamin D (CALCIUM 600+D) 600-400 MG-UNIT tablet Take 1 tablet by mouth 2 (two) times daily.  . Cranberry 250 MG CAPS Take 250 mg by mouth daily.   . famotidine (PEPCID) 10 MG tablet Take 10 mg by mouth at bedtime.   . Fluticasone-Salmeterol (ADVAIR) 250-50 MCG/DOSE AEPB Inhale 1 puff into the lungs 2 (two) times daily.  [name] Kitchen glucosamine-chondroitin 500-400 MG tablet Take 1 tablet by mouth 2 (two) times daily.  . hydrOXYzine (VISTARIL) 25 MG capsule Take 1 capsule (25 mg total) by mouth 2 (two) times daily as needed.  . loratadine (CLARITIN) 10 MG tablet Take 10 mg by mouth daily.  . Magnesium Oxide  250 MG TABS Take 500 mg by mouth at bedtime.  . montelukast (SINGULAIR) 10 MG tablet Take 10 mg by mouth daily.   . Multiple Vitamin (MULTIVITAMIN WITH MINERALS) TABS tablet Take 1 tablet by mouth daily.  [name] Kitchen oxybutynin (DITROPAN-XL) 10 MG 24 hr tablet Take 10 mg by mouth at bedtime.  . sertraline (ZOLOFT) 100 MG tablet Take 1 tablet (100 mg total) by mouth at bedtime.  [name] Kitchen THEO-24 400 MG 24 hr capsule TK 1 C PO D  . theophylline (UNIPHYL) 400 MG 24 hr tablet Take 400 mg by mouth daily.  [name] Kitchen tiZANidine (ZANAFLEX) 4 MG tablet TAKE 1  TABLET BY MOUTH EVERY 6 HOURS AS NEEDED  . traMADol (ULTRAM) 50 MG tablet Take 100 mg by mouth every 8 (eight) hours as needed for moderate pain.  . traZODone (DESYREL) 50 MG tablet Take 2 tablets (100 mg total) by mouth at bedtime.  [name] Kitchen trifluoperazine (STELAZINE) 10 MG tablet Take 1 tablet (10 mg total) by mouth at bedtime.  . vitamin C (ASCORBIC ACID) 500 MG tablet Take 2,000 mg by mouth 2 (two) times daily.    Review of Systems  Constitutional: Positive for appetite change and fatigue. Negative for fever and chills.  Respiratory: Positive for cough, chest tightness, shortness of breath and wheezing.   Cardiovascular: Negative for chest pain, palpitations and leg swelling.  Gastrointestinal: Negative for nausea, vomiting and abdominal pain.  Neurological: Positive for dizziness, tremors (when holding objects), weakness (in hands; difficulty holding objects in her hands) and light-headedness.  Psychiatric/Behavioral: Positive for sleep disturbance. The patient is nervous/anxious.     Social History  Substance Use Topics  . Smoking status: Former Smoker -- 30 years  . Smokeless tobacco: Never Used     Comment: quit in 1990's  . Alcohol Use: No   Objective:   BP 132/62 mmHg  Pulse 76  Temp(Src) 97.6 F (36.4 C) (Oral)  Resp 18  SpO2 94%  Physical Exam   General Appearance:    Alert, cooperative, no distress  Eyes:    PERRL, conjunctiva/corneas clear, EOM's intact       Lungs:     Clear to auscultation bilaterally, respirations unlabored  Heart:    Regular rate and rhythm  Neurologic:   Awake, alert, oriented x 3. No apparent focal neurological           defect. No tremor. No cogwheeling. +5 grip and UE strength bilaterally.           Assessment & Plan:     1. Anxiety She feels vistaril is making her dizzy. Will change to 10mg  to get by until her follow up with psychiatry. Advised she is being appropriately weaned from clonazepam  2. Essential hypertension Much better on  low dose of benazepril. Continue current medications.  Follow up 3-4 months.        Lelon Huh, MD  [facility]

## 2015-11-17 DIAGNOSIS — I270 Primary pulmonary hypertension: Secondary | ICD-10-CM | POA: Diagnosis not present

## 2015-11-20 ENCOUNTER — Ambulatory Visit: Payer: No Typology Code available for payment source | Admitting: Psychiatry

## 2015-11-20 ENCOUNTER — Other Ambulatory Visit: Payer: Self-pay | Admitting: Family Medicine

## 2015-11-20 DIAGNOSIS — G4734 Idiopathic sleep related nonobstructive alveolar hypoventilation: Secondary | ICD-10-CM

## 2015-11-20 NOTE — Progress Notes (Unsigned)
Overnight oximetry shows drop in oxygen consistent with sleep apnea. Needs to get formal sleep study. Have entered order for sarah to schedule, please advise patient.

## 2015-11-20 NOTE — Progress Notes (Signed)
Patient was notified of results. Patient expressed understanding. 

## 2015-11-20 NOTE — Progress Notes (Signed)
Patient schedule sleep study? Thanks!

## 2015-11-25 ENCOUNTER — Encounter: Payer: Self-pay | Admitting: Family Medicine

## 2015-11-25 ENCOUNTER — Telehealth: Payer: Self-pay | Admitting: Family Medicine

## 2015-11-25 NOTE — Telephone Encounter (Signed)
Order for PSG faxed to [facility] °

## 2015-12-01 ENCOUNTER — Other Ambulatory Visit: Payer: Self-pay | Admitting: Family Medicine

## 2015-12-01 DIAGNOSIS — Z1231 Encounter for screening mammogram for malignant neoplasm of breast: Secondary | ICD-10-CM

## 2015-12-11 ENCOUNTER — Ambulatory Visit: Payer: Commercial Managed Care - HMO | Admitting: Psychiatry

## 2015-12-17 ENCOUNTER — Ambulatory Visit (INDEPENDENT_AMBULATORY_CARE_PROVIDER_SITE_OTHER): Payer: Commercial Managed Care - HMO | Admitting: Psychiatry

## 2015-12-17 DIAGNOSIS — F209 Schizophrenia, unspecified: Secondary | ICD-10-CM | POA: Diagnosis not present

## 2015-12-17 MED ORDER — TRIFLUOPERAZINE HCL 10 MG PO TABS: 10.0000 mg | ORAL_TABLET | Freq: Every day | ORAL | Status: DC

## 2015-12-17 MED ORDER — SERTRALINE HCL 100 MG PO TABS: 100.0000 mg | ORAL_TABLET | Freq: Every day | ORAL | Status: DC

## 2015-12-17 MED ORDER — HYDROXYZINE HCL 10 MG PO TABS: 10.0000 mg | ORAL_TABLET | Freq: Two times a day (BID) | ORAL | Status: DC | PRN

## 2015-12-17 MED ORDER — BENZTROPINE MESYLATE 0.5 MG PO TABS: 0.5000 mg | ORAL_TABLET | Freq: Every day | ORAL | Status: DC

## 2015-12-17 NOTE — Progress Notes (Signed)
Patient ID: Vicki Morales, female   DOB: [birth year], 75 y.o.   MRN: [id number]   [facility] MD/PA/NP OP Progress Note  12/17/2015 11:52 AM JAMILET DARLEY  MRN:  [id number]  Subjective:  Patient presents for follow-up of her schizophrenia. Patient presents with her daughter today.Sattes she is doing well. She has tapered off teh Klonopin fully. She had started on the Vistaril at 25mg , felt it was too much and her PCP changed it to 10mg  and she reports it works quite well for her. She is sleeping well and eating well. Denies any mood symptoms. Denies any psychotic symptoms.   Chief Complaint: Doing well.  Visit Diagnosis:     ICD-9-CM ICD-10-CM   1. Schizophrenia, unspecified type ([place])  F20.9     Past Medical History:  Past Medical History  Diagnosis Date  . COPD (chronic obstructive pulmonary disease) ([place])   . Hypertension   . Depression   . Anxiety   . GERD (gastroesophageal reflux disease)     Past Surgical History  Procedure Laterality Date  . Abdominal hysterectomy  1988  . Appendectomy  1988  . Tonsillectomy and adenoidectomy    . Trigger finger release  2004  . Esophagogastroduodenoscopy N/A 11/11/2014    Procedure: ESOPHAGOGASTRODUODENOSCOPY (EGD);  Surgeon: Josefine Class, MD;  [facility]COPY;  Service: Endoscopy;  Laterality: N/A;   Family History:  Family History  Problem Relation Age of Onset  . Breast cancer Sister   . Stroke Father   . Emphysema Father   . Anxiety disorder Father   . Depression Father   . Anxiety disorder Brother    Social History:  Social History   Social History  . Marital Status: Widowed    Spouse Name: N/A  . Number of Children: N/A  . Years of Education: N/A   Social History Main Topics  . Smoking status: Former Smoker -- 30 years  . Smokeless tobacco: Never Used     Comment: quit in 1990's  . Alcohol Use: No  . Drug Use: No  . Sexual Activity: Not Currently   Other Topics Concern  . Not on file   Social History  Narrative   Additional History:   Assessment:   Musculoskeletal: Strength & Muscle Tone: within normal limits Gait & Station: Slow and walks with a cane Patient leans: N/A  Psychiatric Specialty Exam: HPI  Review of Systems  Psychiatric/Behavioral: Negative for depression, suicidal ideas, hallucinations, memory loss and substance abuse. The patient is not nervous/anxious and does not have insomnia.   All other systems reviewed and are negative.   There were no vitals taken for this visit.There is no weight on file to calculate BMI.  General Appearance: Well Groomed  Eye Contact:  Good  Speech:  Normal Rate  Volume:  Normal  Mood:  Good  Affect:  Congruent Bright, smiling   Thought Process:  Linear and Logical  Orientation:  Full (Time, Place, and Person)  Thought Content:  Negative  Suicidal Thoughts:  No  Homicidal Thoughts:  No  Memory:  Immediate;   Good Recent;   Good Remote;   Good  Judgement:  Good  Insight:  Good  Psychomotor Activity:  Normal  Concentration:  Good  Recall:  Good  Fund of Knowledge: Good  Language: Good  Akathisia:  Negative  Handed:    AIMS (if indicated):  Mild cogwheeling noted on right arm   Assets:  Communication Skills Desire for Improvement  ADL's:  Intact  Cognition:  WNL  Sleep:  Good states 12 midnight to 8am last night   Is the patient at risk to self?  No. Has the patient been a risk to self in the past 6 months?  No. Has the patient been a risk to self within the distant past?  Yes.  OD in 1979 Is the patient a risk to others?  No. Has the patient been a risk to others in the past 6 months?  No. Has the patient been a risk to others within the distant past?  No.  Current Medications: Current Outpatient Prescriptions  Medication Sig Dispense Refill  . acidophilus (RISAQUAD) CAPS capsule Take 1 capsule by mouth daily.    [name] Kitchen amLODipine (NORVASC) 10 MG tablet Take 1 tablet (10 mg total) by mouth daily. 30 tablet 3  . aspirin  EC 325 MG tablet Take 1 tablet (325 mg total) by mouth daily. 30 tablet 2  . benazepril (LOTENSIN) 5 MG tablet Take 1 tablet (5 mg total) by mouth daily. 30 tablet 5  . benztropine (COGENTIN) 0.5 MG tablet Take 1 tablet (0.5 mg total) by mouth at bedtime. 60 tablet 2  . butalbital-acetaminophen-caffeine (FIORICET, ESGIC) 50-325-40 MG tablet TAKE 1 TABLET BY MOUTH EVERY 4 HOURS AS NEEDED FOR HEADACHE 30 tablet 0  . Calcium Carbonate-Vitamin D (CALCIUM 600+D) 600-400 MG-UNIT tablet Take 1 tablet by mouth 2 (two) times daily.    . Cranberry 250 MG CAPS Take 250 mg by mouth daily.     . famotidine (PEPCID) 10 MG tablet Take 10 mg by mouth at bedtime.     . Fluticasone-Salmeterol (ADVAIR) 250-50 MCG/DOSE AEPB Inhale 1 puff into the lungs 2 (two) times daily.    [name] Kitchen glucosamine-chondroitin 500-400 MG tablet Take 1 tablet by mouth 2 (two) times daily.    . hydrOXYzine (ATARAX/VISTARIL) 10 MG tablet Take 1 tablet (10 mg total) by mouth 2 (two) times daily as needed. 60 tablet 0  . hydrOXYzine (VISTARIL) 25 MG capsule Take 1 capsule (25 mg total) by mouth 2 (two) times daily as needed. 30 capsule 0  . loratadine (CLARITIN) 10 MG tablet Take 10 mg by mouth daily.    . Magnesium Oxide 250 MG TABS Take 500 mg by mouth at bedtime.    . montelukast (SINGULAIR) 10 MG tablet Take 10 mg by mouth daily.     . Multiple Vitamin (MULTIVITAMIN WITH MINERALS) TABS tablet Take 1 tablet by mouth daily.    [name] Kitchen oxybutynin (DITROPAN-XL) 10 MG 24 hr tablet Take 10 mg by mouth at bedtime.    . sertraline (ZOLOFT) 100 MG tablet Take 1 tablet (100 mg total) by mouth at bedtime. 30 tablet 2  . THEO-24 400 MG 24 hr capsule TK 1 C PO D  6  . theophylline (UNIPHYL) 400 MG 24 hr tablet Take 400 mg by mouth daily.    [name] Kitchen tiZANidine (ZANAFLEX) 4 MG tablet TAKE 1 TABLET BY MOUTH EVERY 6 HOURS AS NEEDED 30 tablet 3  . traMADol (ULTRAM) 50 MG tablet Take 100 mg by mouth every 8 (eight) hours as needed for moderate pain.    . traZODone  (DESYREL) 50 MG tablet Take 2 tablets (100 mg total) by mouth at bedtime. 60 tablet 2  . trifluoperazine (STELAZINE) 10 MG tablet Take 1 tablet (10 mg total) by mouth at bedtime. 30 tablet 2  . vitamin C (ASCORBIC ACID) 500 MG tablet Take 2,000 mg by mouth 2 (two) times daily.     No current facility-administered  medications for this visit.    Medical Decision Making:  Established Problem, Stable/Improving (1) and Review of Medication Regimen & Side Effects (2)  Treatment Plan Summary:Medication management and Plan Plan Schizophrenia-  Continue her Stelazine 10 mg at bedtime, her sertraline at 100 mg daily, her trazodone at 100 mg at bedtime, her benztropine at 0.5 mg at bedtime  Patient interested in coming off her Stelazine at some time since she's been taking it for a long time. Discussed with her cutting it down to 5 mg at some point and see how she does for a month before she stops it completely. Patient agreeable with this plan. Continue Vistaril  at 10mg  daily as needed for anxiety.  She will follow-up in 3 months or call before if needed  Rickeya Manus 12/17/2015, 11:52 AM

## 2015-12-23 ENCOUNTER — Ambulatory Visit: Payer: Self-pay | Admitting: Family Medicine

## 2015-12-24 ENCOUNTER — Emergency Department: Payer: Commercial Managed Care - HMO

## 2015-12-24 ENCOUNTER — Emergency Department
Admission: EM | Admit: 2015-12-24 | Discharge: 2015-12-24 | Disposition: A | Discharge status: Home / Self Care | Payer: Commercial Managed Care - HMO | Attending: Emergency Medicine | Admitting: Emergency Medicine

## 2015-12-24 DIAGNOSIS — F329 Major depressive disorder, single episode, unspecified: Secondary | ICD-10-CM | POA: Diagnosis not present

## 2015-12-24 DIAGNOSIS — R079 Chest pain, unspecified: Secondary | ICD-10-CM | POA: Insufficient documentation

## 2015-12-24 DIAGNOSIS — M1990 Unspecified osteoarthritis, unspecified site: Secondary | ICD-10-CM | POA: Diagnosis not present

## 2015-12-24 DIAGNOSIS — I272 Other secondary pulmonary hypertension: Secondary | ICD-10-CM | POA: Insufficient documentation

## 2015-12-24 DIAGNOSIS — J45909 Unspecified asthma, uncomplicated: Secondary | ICD-10-CM | POA: Diagnosis not present

## 2015-12-24 DIAGNOSIS — Z7982 Long term (current) use of aspirin: Secondary | ICD-10-CM | POA: Diagnosis not present

## 2015-12-24 DIAGNOSIS — Z79899 Other long term (current) drug therapy: Secondary | ICD-10-CM | POA: Diagnosis not present

## 2015-12-24 DIAGNOSIS — Z87891 Personal history of nicotine dependence: Secondary | ICD-10-CM | POA: Insufficient documentation

## 2015-12-24 DIAGNOSIS — J449 Chronic obstructive pulmonary disease, unspecified: Secondary | ICD-10-CM | POA: Insufficient documentation

## 2015-12-24 DIAGNOSIS — R0602 Shortness of breath: Secondary | ICD-10-CM | POA: Diagnosis present

## 2015-12-24 LAB — TROPONIN I: Troponin I: <0.03 ng/mL (ref <0.03)

## 2015-12-24 LAB — BASIC METABOLIC PANEL
Anion gap: 8 (ref 5–15)
BUN: 16 mg/dL (ref 6–20)
CHLORIDE: 101 mmol/L (ref 101–111)
CO2: 27 mmol/L (ref 22–32)
Calcium: 9.2 mg/dL (ref 8.9–10.3)
Creatinine, Ser: 0.85 mg/dL (ref 0.44–1.00)
GFR calc Af Amer: >60 mL/min (ref >60)
GFR calc non Af Amer: >60 mL/min (ref >60)
GLUCOSE: 101 mg/dL — AB (ref 65–99)
POTASSIUM: 3.5 mmol/L (ref 3.5–5.1)
SODIUM: 136 mmol/L (ref 135–145)

## 2015-12-24 LAB — CBC
HCT: 40 % (ref 35.0–47.0)
Hemoglobin: 13.7 g/dL (ref 12.0–16.0)
MCH: 31.2 pg (ref 26.0–34.0)
MCHC: 34.2 g/dL (ref 32.0–36.0)
MCV: 91.2 fL (ref 80.0–100.0)
Platelets: 154 10*3/uL (ref 150–440)
RBC: 4.39 MIL/uL (ref 3.80–5.20)
RDW: 14.9 % — AB (ref 11.5–14.5)
WBC: 6.3 10*3/uL (ref 3.6–11.0)

## 2015-12-24 MED ORDER — GI COCKTAIL ~~LOC~~
30.0000 mL | Freq: Once | ORAL | Status: AC
  Administered 2015-12-24: 30 mL via ORAL
  Filled 2015-12-24: qty 30

## 2015-12-24 MED ORDER — NITROGLYCERIN 0.4 MG SL SUBL
0.4000 mg | SUBLINGUAL_TABLET | SUBLINGUAL | Status: DC | PRN
  Administered 2015-12-24: 0.4 mg via SUBLINGUAL
  Filled 2015-12-24: qty 1

## 2015-12-24 MED ORDER — SUCRALFATE 1 G PO TABS: 1.0000 g | ORAL_TABLET | Freq: Two times a day (BID) | ORAL | Status: DC

## 2015-12-24 NOTE — ED Notes (Signed)
Assisted pt on bedpan to void. Pt tolerated well.

## 2015-12-24 NOTE — ED Notes (Signed)
Pt verbalized understanding of discharge instructions. NAD at this time. 

## 2015-12-24 NOTE — ED Notes (Signed)
Pt denies diabetes.

## 2015-12-24 NOTE — ED Notes (Addendum)
Pt c/o of medial chest pain described burning beginning at 2100 on 7/18. Pt denies radiation. Pt reports weakness, light-headedness/dizziness, SOB. Pt has hx of COPD, high cholesterol, hypertension

## 2015-12-24 NOTE — Discharge Instructions (Signed)
Nonspecific Chest Pain  °Chest pain can be caused by many different conditions. There is always a chance that your pain could be related to something serious, such as a heart attack or a blood clot in your lungs. Chest pain can also be caused by conditions that are not life-threatening. If you have chest pain, it is very important to follow up with your health care provider. °CAUSES  °Chest pain can be caused by: °· Heartburn. °· Pneumonia or bronchitis. °· Anxiety or stress. °· Inflammation around your heart (pericarditis) or lung (pleuritis or pleurisy). °· A blood clot in your lung. °· A collapsed lung (pneumothorax). It can develop suddenly on its own (spontaneous pneumothorax) or from trauma to the chest. °· Shingles infection (varicella-zoster virus). °· Heart attack. °· Damage to the bones, muscles, and cartilage that make up your chest wall. This can include: °¨ Bruised bones due to injury. °¨ Strained muscles or cartilage due to frequent or repeated coughing or overwork. °¨ Fracture to one or more ribs. °¨ Sore cartilage due to inflammation (costochondritis). °RISK FACTORS  °Risk factors for chest pain may include: °· Activities that increase your risk for trauma or injury to your chest. °· Respiratory infections or conditions that cause frequent coughing. °· Medical conditions or overeating that can cause heartburn. °· Heart disease or family history of heart disease. °· Conditions or health behaviors that increase your risk of developing a blood clot. °· Having had chicken pox (varicella zoster). °SIGNS AND SYMPTOMS °Chest pain can feel like: °· Burning or tingling on the surface of your chest or deep in your chest. °· Crushing, pressure, aching, or squeezing pain. °· Dull or sharp pain that is worse when you move, cough, or take a deep breath. °· Pain that is also felt in your back, neck, shoulder, or arm, or pain that spreads to any of these areas. °Your chest pain may come and go, or it may stay  constant. °DIAGNOSIS °Lab tests or other studies may be needed to find the cause of your pain. Your health care provider may have you take a test called an ambulatory ECG (electrocardiogram). An ECG records your heartbeat patterns at the time the test is performed. You may also have other tests, such as: °· Transthoracic echocardiogram (TTE). During echocardiography, sound waves are used to create a picture of all of the heart structures and to look at how blood flows through your heart. °· Transesophageal echocardiogram (TEE). This is a more advanced imaging test that obtains images from inside your body. It allows your health care provider to see your heart in finer detail. °· Cardiac monitoring. This allows your health care provider to monitor your heart rate and rhythm in real time. °· Holter monitor. This is a portable device that records your heartbeat and can help to diagnose abnormal heartbeats. It allows your health care provider to track your heart activity for several days, if needed. °· Stress tests. These can be done through exercise or by taking medicine that makes your heart beat more quickly. °· Blood tests. °· Imaging tests. °TREATMENT  °Your treatment depends on what is causing your chest pain. Treatment may include: °· Medicines. These may include: °¨ Acid blockers for heartburn. °¨ Anti-inflammatory medicine. °¨ Pain medicine for inflammatory conditions. °¨ Antibiotic medicine, if an infection is present. °¨ Medicines to dissolve blood clots. °¨ Medicines to treat coronary artery disease. °· Supportive care for conditions that do not require medicines. This may include: °¨ Resting. °¨ Applying heat   or cold packs to injured areas. °¨ Limiting activities until pain decreases. °HOME CARE INSTRUCTIONS °· If you were prescribed an antibiotic medicine, finish it all even if you start to feel better. °· Avoid any activities that bring on chest pain. °· Do not use any tobacco products, including  cigarettes, chewing tobacco, or electronic cigarettes. If you need help quitting, ask your health care provider. °· Do not drink alcohol. °· Take medicines only as directed by your health care provider. °· Keep all follow-up visits as directed by your health care provider. This is important. This includes any further testing if your chest pain does not go away. °· If heartburn is the cause for your chest pain, you may be told to keep your head raised (elevated) while sleeping. This reduces the chance that acid will go from your stomach into your esophagus. °· Make lifestyle changes as directed by your health care provider. These may include: °¨ Getting regular exercise. Ask your health care provider to suggest some activities that are safe for you. °¨ Eating a heart-healthy diet. A registered dietitian can help you to learn healthy eating options. °¨ Maintaining a healthy weight. °¨ Managing diabetes, if necessary. °¨ Reducing stress. °SEEK MEDICAL CARE IF: °· Your chest pain does not go away after treatment. °· You have a rash with blisters on your chest. °· You have a fever. °SEEK IMMEDIATE MEDICAL CARE IF:  °· Your chest pain is worse. °· You have an increasing cough, or you cough up blood. °· You have severe abdominal pain. °· You have severe weakness. °· You faint. °· You have chills. °· You have sudden, unexplained chest discomfort. °· You have sudden, unexplained discomfort in your arms, back, neck, or jaw. °· You have shortness of breath at any time. °· You suddenly start to sweat, or your skin gets clammy. °· You feel nauseous or you vomit. °· You suddenly feel light-headed or dizzy. °· Your heart begins to beat quickly, or it feels like it is skipping beats. °These symptoms may represent a serious problem that is an emergency. Do not wait to see if the symptoms will go away. Get medical help right away. Call your local emergency services (911 in the [place] Do not drive yourself to the hospital. °  °This  information is not intended to replace advice given to you by your health care provider. Make sure you discuss any questions you have with your health care provider. °  °Document Released: 03/03/2005 Document Revised: 06/14/2014 Document Reviewed: 12/28/2013 °[employer] Interactive Patient Education ©2016 [employer] ° °

## 2015-12-24 NOTE — ED Provider Notes (Signed)
[facility] Emergency Department Provider Note   ____________________________________________  Time seen: Approximately 320 AM  I have reviewed the triage vital signs and the nursing notes.   HISTORY  Chief Complaint Chest Pain    HPI Vicki Morales is a 75 y.o. female who comes into the hospital today with chest pain. She reports that the air conditioning went out in her apartment earlier today. She reports that someone did not come to repair it until about 6 PM. She reports that although she had been using fans all day she was breathing and hot air. She reports that this evening around 10 or 11 PM she started feeling some burning in her chest. She is unsure if it was due to the heat and if she breathes and hot air that was too much. The patient has a history of COPD and did not wanted to get worse. She felt as though she had burned herself on the inside and could not get to sleep or relax. The patient rates her pain as 6-7 out of 10 in intensity. She has some mild shortness of breath but no radiation of the pain. She feels very nervous. The patient took some hydroxyzine 20 mg for her nerves but decided to come into the hospital. The patient has no sweats, no nausea no vomiting but did feel a little lightheaded. She is here for evaluation of this chest discomfort.   Past Medical History  Diagnosis Date  . COPD (chronic obstructive pulmonary disease) ([place])   . Hypertension   . Depression   . Anxiety   . GERD (gastroesophageal reflux disease)     Patient Active Problem List   Diagnosis Date Noted  . Pulmonary hypertension ([place]) 11/11/2015  . Anxiety 09/22/2015  . Depression 09/22/2015  . Chest pressure 09/22/2015  . Osteoarthritis 09/10/2015  . Rosacea 07/03/2015  . Breast pain 11/07/2014  . Callus of foot 11/07/2014  . Weak pulse 11/07/2014  . CN (constipation) 11/07/2014  . Dermatitis, eczematoid 11/07/2014  . Diverticulosis of colon 11/07/2014    . Dizziness 11/07/2014  . Can't get food down 11/07/2014  . Accumulation of fluid in tissues 11/07/2014  . 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 Fatigue 11/07/2014  . Farts 11/07/2014  . FOM (frequency of micturition) 11/07/2014  . Tension type headache 11/07/2014  . LBP (low back pain) 11/07/2014  . Episodic mood disorder ([place]) 11/07/2014  . Extreme obesity ([place]) 11/07/2014  . Muscle ache 11/07/2014  . Disturbance of skin sensation 11/07/2014  . Awareness of heartbeats 11/07/2014  . Jerking 11/07/2014  . Body tinea 11/07/2014  . Essential (primary) hypertension 10/10/2014  . H/O: obesity 04/01/2014  . COPD, moderate ([place]) 04/01/2014  . Moderate COPD (chronic obstructive pulmonary disease) ([place]) 04/01/2014  . 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 Malaise and fatigue 11/26/2008  . Aphasia 09/26/2008  . 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 GERD (gastroesophageal reflux disease) 06/07/1998  . HLD (hyperlipidemia) 06/07/1998  . HTN (hypertension) 06/07/1998  . OP (osteoporosis) 06/07/1998  . H/O total hysterectomy 03/08/1987  . Schizophrenia, in remission ([place]) 06/07/1978    Past Surgical History  Procedure Laterality Date  . Abdominal hysterectomy  1988  . Appendectomy  1988  . Tonsillectomy and adenoidectomy    . Trigger finger release  2004  . Esophagogastroduodenoscopy N/A 11/11/2014    Procedure: ESOPHAGOGASTRODUODENOSCOPY (EGD);  Surgeon: Josefine Class, MD;  Location: [facility] ENDOSCOPY;  Service: Endoscopy;  Laterality: N/A;  Current Outpatient Rx  Name  Route  Sig  Dispense  Refill  . acidophilus (RISAQUAD) CAPS capsule   Oral   Take 1 capsule by mouth daily.         [name] Kitchen albuterol (PROVENTIL) (2.5 MG/3ML) 0.083% nebulizer solution   Nebulization   Take 2.5 mg by nebulization 2 (two) times daily as needed for wheezing or shortness of breath.         [name] Kitchen amLODipine (NORVASC) 5 MG tablet   Oral    Take 5 mg by mouth daily.         . benazepril (LOTENSIN) 5 MG tablet   Oral   Take 1 tablet (5 mg total) by mouth daily.   30 tablet   5   . benztropine (COGENTIN) 0.5 MG tablet   Oral   Take 1 tablet (0.5 mg total) by mouth at bedtime.   60 tablet   2   . butalbital-acetaminophen-caffeine (FIORICET, ESGIC) 50-325-40 MG tablet      TAKE 1 TABLET BY MOUTH EVERY 4 HOURS AS NEEDED FOR HEADACHE   30 tablet   0   . Calcium Carbonate-Vitamin D (CALCIUM 600+D) 600-400 MG-UNIT tablet   Oral   Take 1 tablet by mouth 2 (two) times daily.         . Cranberry 250 MG CAPS   Oral   Take 250 mg by mouth daily.          . fluticasone (FLONASE) 50 MCG/ACT nasal spray   Each Nare   Place 1 spray into both nostrils daily.         . Fluticasone-Salmeterol (ADVAIR) 250-50 MCG/DOSE AEPB   Inhalation   Inhale 1 puff into the lungs 2 (two) times daily.         . hydrOXYzine (ATARAX/VISTARIL) 10 MG tablet   Oral   Take 1 tablet (10 mg total) by mouth 2 (two) times daily as needed. Patient taking differently: Take 10 mg by mouth 2 (two) times daily as needed for anxiety.    60 tablet   1   . loratadine (CLARITIN) 10 MG tablet   Oral   Take 10 mg by mouth daily.         . Magnesium Oxide 250 MG TABS   Oral   Take 500 mg by mouth at bedtime.         . Misc Natural Products (OSTEO BI-FLEX JOINT SHIELD PO)   Oral   Take 1 tablet by mouth 2 (two) times daily.         . montelukast (SINGULAIR) 10 MG tablet   Oral   Take 10 mg by mouth daily.          . Multiple Vitamin (MULTIVITAMIN WITH MINERALS) TABS tablet   Oral   Take 1 tablet by mouth daily.         [name] Kitchen omeprazole (PRILOSEC) 20 MG capsule   Oral   Take 20 mg by mouth daily.         [name] Kitchen oxybutynin (DITROPAN-XL) 10 MG 24 hr tablet   Oral   Take 10 mg by mouth at bedtime.         . ranitidine (ZANTAC) 150 MG tablet   Oral   Take 150 mg by mouth at bedtime.         . sertraline (ZOLOFT) 100 MG  tablet   Oral   Take 1 tablet (100 mg total) by mouth at bedtime.   30 tablet  2   . theophylline (UNIPHYL) 400 MG 24 hr tablet   Oral   Take 400 mg by mouth daily.         [name] Kitchen tiZANidine (ZANAFLEX) 4 MG tablet   Oral   Take 4 mg by mouth daily.         . traMADol (ULTRAM) 50 MG tablet   Oral   Take 100 mg by mouth every 8 (eight) hours as needed for moderate pain.         . traZODone (DESYREL) 50 MG tablet   Oral   Take 2 tablets (100 mg total) by mouth at bedtime.   60 tablet   2   . trifluoperazine (STELAZINE) 10 MG tablet   Oral   Take 1 tablet (10 mg total) by mouth at bedtime.   30 tablet   2   . amLODipine (NORVASC) 10 MG tablet   Oral   Take 1 tablet (10 mg total) by mouth daily. Patient not taking: Reported on 12/24/2015   30 tablet   3   . aspirin EC 325 MG tablet   Oral   Take 1 tablet (325 mg total) by mouth daily. Patient not taking: Reported on 12/24/2015   30 tablet   2   . glucosamine-chondroitin 500-400 MG tablet   Oral   Take 1 tablet by mouth 2 (two) times daily.         . sucralfate (CARAFATE) 1 g tablet   Oral   Take 1 tablet (1 g total) by mouth 2 (two) times daily.   20 tablet   0     Allergies Arthrotec; Codeine; Hydrochlorothiazide; Morphine; Penicillins; Sulfa antibiotics; Tiotropium bromide monohydrate; and Pantoprazole  Family History  Problem Relation Age of Onset  . Breast cancer Sister   . Stroke Father   . Emphysema Father   . Anxiety disorder Father   . Depression Father   . Anxiety disorder Brother     Social History Social History  Substance Use Topics  . Smoking status: Former Smoker -- 30 years  . Smokeless tobacco: Never Used     Comment: quit in 1990's  . Alcohol Use: No    Review of Systems Constitutional: No fever/chills Eyes: No visual changes. ENT: No sore throat. Cardiovascular:  chest pain. Respiratory: Mild shortness of breath. Gastrointestinal: No abdominal pain.  No nausea, no  vomiting.  No diarrhea.  No constipation. Genitourinary: Negative for dysuria. Musculoskeletal: Negative for back pain. Skin: Negative for rash. Neurological: Lightheadedness  10-point ROS otherwise negative.  ____________________________________________   PHYSICAL EXAM:  VITAL SIGNS: ED Triage Vitals  Enc Vitals Group     BP 12/24/15 0241 159/57 mmHg     Pulse Rate 12/24/15 0241 73     Resp 12/24/15 0241 16     Temp 12/24/15 0241 98.1 F (36.7 C)     Temp Source 12/24/15 0241 Oral     SpO2 12/24/15 0241 96 %     Weight 12/24/15 0241 245 lb (111.131 kg)     Height 12/24/15 0241 5\' 3"  (1.6 m)     Head Cir --      Peak Flow --      Pain Score 12/24/15 0236 6     Pain Loc --      Pain Edu? --      Excl. in [place]? --     Constitutional: Alert and oriented. Well appearing and in mild acute distress. Eyes: Conjunctivae are normal. PERRL. EOMI. Head: Atraumatic.  Nose: No congestion/rhinnorhea. Mouth/Throat: Mucous membranes are moist.  Oropharynx non-erythematous. Cardiovascular: Normal rate, regular rhythm. Grossly normal heart sounds.  Good peripheral circulation. Respiratory: Normal respiratory effort.  No retractions. Lungs CTAB. Gastrointestinal: Soft and nontender. No distention. Positive bowel sounds Musculoskeletal: No lower extremity tenderness nor edema.   Neurologic:  Normal speech and language.  Skin:  Skin is warm, dry and intact.  Psychiatric: Mood and affect are normal.   ____________________________________________   LABS (all labs ordered are listed, but only abnormal results are displayed)  Labs Reviewed  BASIC METABOLIC PANEL - Abnormal; Notable for the following:    Glucose, Bld 101 (*)    All other components within normal limits  CBC - Abnormal; Notable for the following:    RDW 14.9 (*)    All other components within normal limits  TROPONIN I  TROPONIN I   ____________________________________________  EKG  ED ECG REPORT I, Loney Hering, the attending physician, personally viewed and interpreted this ECG.   Date: 12/24/2015  EKG Time: 239  Rate: 74  Rhythm: normal sinus rhythm  Axis: normal  Intervals:none  ST&T Change: none  ____________________________________________  RADIOLOGY  CXR: There is an 37mm nodule in the right upper lobe not discretely seen on the April 2017 Ct scan. Recommend a short-term follow in 2-3 weeks. If the nodular opacity does not resolve at that time recommend Ct imaging   CT chest: Negative for pulmonary nodule to correlate with opacity in the right upper lobe seen on prior x-ray. No acute disease, no follow-up imaging recommended, aortic atherosclerosis, old granulomatous disease. ____________________________________________   PROCEDURES  Procedure(s) performed: None  Procedures  Critical Care performed: No  ____________________________________________   INITIAL IMPRESSION / ASSESSMENT AND PLAN / ED COURSE  Pertinent labs & imaging results that were available during my care of the patient were reviewed by me and considered in my medical decision making (see chart for details).  This is a 75 year old female who comes into the hospital today with some chest pain. The patient had an EKG which did not show an ST segment elevation MI. I gave the patient a GI cocktail and some nitroglycerin and her pain is improved. I will repeat the patient's troponin as well as do a CT scan given the nodule on the patient's x-ray. The patient will be reassessed when she's receive her blood work.  The patient's CT scan is negative and her repeat troponin is unremarkable. The patient be discharged home to follow-up with her primary care physician. ____________________________________________   FINAL CLINICAL IMPRESSION(S) / ED DIAGNOSES  Final diagnoses:  Chest pain, unspecified chest pain type      NEW MEDICATIONS STARTED DURING THIS VISIT:  New Prescriptions   SUCRALFATE (CARAFATE)  1 G TABLET    Take 1 tablet (1 g total) by mouth 2 (two) times daily.     Note:  This document was prepared using Dragon voice recognition software and may include unintentional dictation errors.    Loney Hering, MD 12/24/15 [phone]

## 2015-12-25 ENCOUNTER — Ambulatory Visit: Payer: BLUE CROSS/BLUE SHIELD | Attending: Family Medicine

## 2015-12-28 ENCOUNTER — Emergency Department: Payer: Commercial Managed Care - HMO

## 2015-12-28 ENCOUNTER — Emergency Department
Admission: EM | Admit: 2015-12-28 | Discharge: 2015-12-28 | Disposition: A | Discharge status: Home / Self Care | Payer: Commercial Managed Care - HMO | Attending: Emergency Medicine | Admitting: Emergency Medicine

## 2015-12-28 ENCOUNTER — Encounter: Payer: Self-pay | Admitting: Emergency Medicine

## 2015-12-28 DIAGNOSIS — I10 Essential (primary) hypertension: Secondary | ICD-10-CM | POA: Diagnosis not present

## 2015-12-28 DIAGNOSIS — Z90710 Acquired absence of both cervix and uterus: Secondary | ICD-10-CM | POA: Diagnosis not present

## 2015-12-28 DIAGNOSIS — Z7982 Long term (current) use of aspirin: Secondary | ICD-10-CM | POA: Diagnosis not present

## 2015-12-28 DIAGNOSIS — R9431 Abnormal electrocardiogram [ECG] [EKG]: Secondary | ICD-10-CM | POA: Diagnosis not present

## 2015-12-28 DIAGNOSIS — R109 Unspecified abdominal pain: Secondary | ICD-10-CM | POA: Insufficient documentation

## 2015-12-28 DIAGNOSIS — J449 Chronic obstructive pulmonary disease, unspecified: Secondary | ICD-10-CM | POA: Diagnosis not present

## 2015-12-28 DIAGNOSIS — R1032 Left lower quadrant pain: Secondary | ICD-10-CM | POA: Diagnosis not present

## 2015-12-28 DIAGNOSIS — Z87891 Personal history of nicotine dependence: Secondary | ICD-10-CM | POA: Diagnosis not present

## 2015-12-28 LAB — URINALYSIS COMPLETE WITH MICROSCOPIC (ARMC ONLY)
BILIRUBIN URINE: NEGATIVE
Glucose, UA: NEGATIVE mg/dL
Hgb urine dipstick: NEGATIVE
Ketones, ur: NEGATIVE mg/dL
Nitrite: NEGATIVE
PH: 8 (ref 5.0–8.0)
PROTEIN: NEGATIVE mg/dL
RBC / HPF: NONE SEEN RBC/hpf (ref 0–5)
Specific Gravity, Urine: 1.005 (ref 1.005–1.030)

## 2015-12-28 LAB — CBC
HCT: 44.9 % (ref 35.0–47.0)
Hemoglobin: 15.3 g/dL (ref 12.0–16.0)
MCH: 31.4 pg (ref 26.0–34.0)
MCHC: 34.1 g/dL (ref 32.0–36.0)
MCV: 92.2 fL (ref 80.0–100.0)
PLATELETS: 162 10*3/uL (ref 150–440)
RBC: 4.87 MIL/uL (ref 3.80–5.20)
RDW: 15.4 % — ABNORMAL HIGH (ref 11.5–14.5)
WBC: 4.4 10*3/uL (ref 3.6–11.0)

## 2015-12-28 LAB — BASIC METABOLIC PANEL
Anion gap: 5 (ref 5–15)
BUN: 13 mg/dL (ref 6–20)
CALCIUM: 9.9 mg/dL (ref 8.9–10.3)
CHLORIDE: 103 mmol/L (ref 101–111)
CO2: 30 mmol/L (ref 22–32)
CREATININE: 0.94 mg/dL (ref 0.44–1.00)
GFR calc non Af Amer: 57 mL/min — ABNORMAL LOW (ref >60)
GLUCOSE: 102 mg/dL — AB (ref 65–99)
Potassium: 4.5 mmol/L (ref 3.5–5.1)
Sodium: 138 mmol/L (ref 135–145)

## 2015-12-28 NOTE — ED Provider Notes (Signed)
[facility] Emergency Department Provider Note    ____________________________________________  Time seen: ~1420  I have reviewed the triage vital signs and the nursing notes.   HISTORY  Chief Complaint Flank Pain   History limited by: Not Limited   HPI Vicki Morales is a 75 y.o. female who presents to the emergency department today because of concerns for left flank pain. The pain started 2 days ago. It is sharp. It is constant. It does have some radiation to the left groin. It is accompanied by some nausea but the patient has not had any vomiting. No change in urination. No pain with urination. No discoloration of urine. No change in defecation. No fevers. Denies history of similar symptoms in the past.   Past Medical History:  Diagnosis Date  . Anxiety   . COPD (chronic obstructive pulmonary disease) ([place])   . Depression   . GERD (gastroesophageal reflux disease)   . Hypertension     Patient Active Problem List   Diagnosis Date Noted  . Pulmonary hypertension ([place]) 11/11/2015  . Anxiety 09/22/2015  . Depression 09/22/2015  . Chest pressure 09/22/2015  . Osteoarthritis 09/10/2015  . Rosacea 07/03/2015  . Breast pain 11/07/2014  . Callus of foot 11/07/2014  . Weak pulse 11/07/2014  . CN (constipation) 11/07/2014  . Dermatitis, eczematoid 11/07/2014  . Diverticulosis of colon 11/07/2014  . Dizziness 11/07/2014  . Can't get food down 11/07/2014  . Accumulation of fluid in tissues 11/07/2014  . 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 Fatigue 11/07/2014  . Farts 11/07/2014  . FOM (frequency of micturition) 11/07/2014  . Tension type headache 11/07/2014  . LBP (low back pain) 11/07/2014  . Episodic mood disorder ([place]) 11/07/2014  . Extreme obesity ([place]) 11/07/2014  . Muscle ache 11/07/2014  . Disturbance of skin sensation 11/07/2014  . Awareness of heartbeats 11/07/2014  . Jerking 11/07/2014  . Body tinea  11/07/2014  . Essential (primary) hypertension 10/10/2014  . H/O: obesity 04/01/2014  . COPD, moderate ([place]) 04/01/2014  . Moderate COPD (chronic obstructive pulmonary disease) ([place]) 04/01/2014  . 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 Malaise and fatigue 11/26/2008  . Aphasia 09/26/2008  . 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 GERD (gastroesophageal reflux disease) 06/07/1998  . HLD (hyperlipidemia) 06/07/1998  . HTN (hypertension) 06/07/1998  . OP (osteoporosis) 06/07/1998  . H/O total hysterectomy 03/08/1987  . Schizophrenia, in remission ([place]) 06/07/1978    Past Surgical History:  Procedure Laterality Date  . ABDOMINAL HYSTERECTOMY  1988  . APPENDECTOMY  1988  . ESOPHAGOGASTRODUODENOSCOPY N/A 11/11/2014   Procedure: ESOPHAGOGASTRODUODENOSCOPY (EGD);  Surgeon: Josefine Class, MD;  Location: [facility] ENDOSCOPY;  Service: Endoscopy;  Laterality: N/A;  . TONSILLECTOMY AND ADENOIDECTOMY    . TRIGGER FINGER RELEASE  2004    Current Outpatient Rx  . Order #: [id number] Class: Historical Med  . Order #: [id number] Class: Historical Med  . Order #: [id number] Class: Normal  . Order #: [id number] Class: Historical Med  . Order #: [id number] Class: Print  . Order #: [id number] Class: Normal  . Order #: [id number] Class: Normal  . Order #: [id number] Class: Phone In  . Order #: [id number] Class: Historical Med  . Order #: [id number] Class: Historical Med  . Order #: [id number] Class: Historical Med  . Order #: [id number] Class: Historical Med  . Order #: [id number] Class: Historical Med  . Order #: [id number] Class: Normal  . Order #: [id number] Class: Historical Med  . Order #: [id number] Class: Historical Med  . Order #: [id number] Class:  Historical Med  . Order #: [id number] Class: Historical Med  . Order #: [id number] Class: Historical Med  . Order #: [id number] Class: Historical Med  . Order #: [id number] Class: Historical Med  . Order #: [id number] Class: Historical Med  .  Order #: [id number] Class: Normal  . Order #: [id number] Class: Print  . Order #: [id number] Class: Historical Med  . Order #: [id number] Class: Historical Med  . Order #: [id number] Class: Historical Med  . Order #: [id number] Class: Normal  . Order #: [id number] Class: Normal    Allergies Arthrotec [diclofenac-misoprostol]; Codeine; Diclofenac; Hydrochlorothiazide; Morphine; Penicillins; Sulfa antibiotics; Tiotropium bromide monohydrate; Tylenol [acetaminophen]; and Pantoprazole  Family History  Problem Relation Age of Onset  . Breast cancer Sister   . Stroke Father   . Emphysema Father   . Anxiety disorder Father   . Depression Father   . Anxiety disorder Brother     Social History Social History  Substance Use Topics  . Smoking status: Former Smoker    Years: 30.00  . Smokeless tobacco: Never Used     Comment: quit in 1990's  . Alcohol use No    Review of Systems  Constitutional: Negative for fever. Cardiovascular: Negative for chest pain. Respiratory: Negative for shortness of breath. Gastrointestinal: Positive for left flank pain. Neurological: Negative for headaches, focal weakness or numbness.  10-point ROS otherwise negative.  ____________________________________________   PHYSICAL EXAM:  VITAL SIGNS: ED Triage Vitals  Enc Vitals Group     BP 12/28/15 1114 (!) 156/53     Pulse Rate 12/28/15 1114 71     Resp 12/28/15 1114 18     Temp 12/28/15 1114 98.3 F (36.8 C)     Temp Source 12/28/15 1114 Oral     SpO2 12/28/15 1114 96 %     Weight 12/28/15 1114 241 lb (109.3 kg)     Height 12/28/15 1114 5\' 3"  (1.6 m)     Head Circumference --      Peak Flow --      Pain Score 12/28/15 1123 7   Constitutional: Alert and oriented. Well appearing and in no distress. Eyes: Conjunctivae are normal. PERRL. Normal extraocular movements. ENT   Head: Normocephalic and atraumatic.   Nose: No congestion/rhinnorhea.   Mouth/Throat: Mucous membranes are moist.    Neck: No stridor. Hematological/Lymphatic/Immunilogical: No cervical lymphadenopathy. Cardiovascular: Normal rate, regular rhythm.  No murmurs, rubs, or gallops. Respiratory: Normal respiratory effort without tachypnea nor retractions. Breath sounds are clear and equal bilaterally. No wheezes/rales/rhonchi. Gastrointestinal: Soft and nontender. No distention. Mild left sided cva tenderness.  Genitourinary: Deferred Musculoskeletal: Normal range of motion in all extremities. No joint effusions.  No lower extremity tenderness nor edema. Neurologic:  Normal speech and language. No gross focal neurologic deficits are appreciated.  Skin:  Skin is warm, dry and intact. No rash noted. Small bruise noted on left flank around area where patient states she has pain. Psychiatric: Mood and affect are normal. Speech and behavior are normal. Patient exhibits appropriate insight and judgment.  ____________________________________________    LABS (pertinent positives/negatives)  Labs Reviewed  BASIC METABOLIC PANEL - Abnormal; Notable for the following:       Result Value   Glucose, Bld 102 (*)    GFR calc non Af Amer 57 (*)    All other components within normal limits  CBC - Abnormal; Notable for the following:    RDW 15.4 (*)    All other components within normal limits  URINALYSIS COMPLETEWITH MICROSCOPIC ([facility] ONLY) - Abnormal; Notable for the following:  Color, Urine STRAW (*)    APPearance CLEAR (*)    Leukocytes, UA 2+ (*)    Bacteria, UA RARE (*)    Squamous Epithelial / LPF 0-5 (*)    All other components within normal limits    ____________________________________________   EKG  I, Nance Pear, attending physician, personally viewed and interpreted this EKG  EKG Time: 1119 Rate: 73 Rhythm: normal sinus rhythm Axis: left axis deviation Intervals: qtc 436 QRS: narrow ST changes: no st elevation Impression: abnormal ekg   ____________________________________________     RADIOLOGY  CT renal stone IMPRESSION: 1. No urinary tract calculi or hydronephrosis. 2. No other explanation for left-sided pain. 3. Aortic atherosclerosis.  ____________________________________________   PROCEDURES  Procedure(s) performed: None  Critical Care performed: No  ____________________________________________   INITIAL IMPRESSION / ASSESSMENT AND PLAN / ED COURSE  Pertinent labs & imaging results that were available during my care of the patient were reviewed by me and considered in my medical decision making (see chart for details).  Patient presented to the emergency department today because of concerns for flank pain. Blood work and urine without any concerning findings. We will however get a CT renal stone to evaluate for kidney stones. Patient has had nephrolithiasis documented in the past. Patient does have small bruise over area of pain however denies any trauma.   Clinical Course  Comment By Time  Patient CT scan with out obvious finding. Discussed this with the patient. Did discuss that we would send urine for urine culture. Will outpatient follow-up with primary care. Nance Pear, MD 07/23 1521     ____________________________________________   FINAL CLINICAL IMPRESSION(S) / ED DIAGNOSES  Final diagnoses:  Left flank pain  Flank pain     Note: This dictation was prepared with Dragon dictation. Any transcriptional errors that result from this process are unintentional    Nance Pear, MD 12/28/15 1523

## 2015-12-28 NOTE — ED Triage Notes (Addendum)
Pt c/o left flank pain and thinks her kidney is infected. Has had nausea and dizziness per pt. Has doctor appt tomorrow.  Pt upset because did not go directly to room even though came in by ambulance.

## 2015-12-28 NOTE — ED Notes (Signed)
MD at bedside. 

## 2015-12-29 ENCOUNTER — Encounter: Payer: Self-pay | Admitting: Family Medicine

## 2015-12-29 ENCOUNTER — Ambulatory Visit (INDEPENDENT_AMBULATORY_CARE_PROVIDER_SITE_OTHER): Payer: Commercial Managed Care - HMO | Admitting: Family Medicine

## 2015-12-29 VITALS — BP 130/60 | HR 84 | Temp 97.9°F | Resp 16

## 2015-12-29 DIAGNOSIS — N390 Urinary tract infection, site not specified: Secondary | ICD-10-CM

## 2015-12-29 DIAGNOSIS — M545 Low back pain, unspecified: Secondary | ICD-10-CM

## 2015-12-29 DIAGNOSIS — N3000 Acute cystitis without hematuria: Secondary | ICD-10-CM | POA: Diagnosis not present

## 2015-12-29 LAB — URINE CULTURE

## 2015-12-29 MED ORDER — CIPROFLOXACIN HCL 500 MG PO TABS: 500.0000 mg | ORAL_TABLET | Freq: Two times a day (BID) | ORAL | 0 refills | Status: AC

## 2015-12-29 NOTE — Progress Notes (Signed)
Patient: Vicki Morales Female    DOB: 1941-02-23   75 y.o.   MRN: [id number] Visit Date: 12/29/2015  Today's Provider: Lelon Huh, MD   Chief Complaint  Patient presents with  . Back Pain   Subjective:    Back Pain  This is a new problem. Episode onset: 3 days ago. The problem occurs constantly. The problem has been gradually worsening since onset. The pain is present in the lumbar spine. The quality of the pain is described as shooting and stabbing. The pain is at a severity of 7/10. The pain is moderate. The pain is the same all the time. The symptoms are aggravated by bending, twisting, sitting, position and standing. Stiffness is present all day. Associated symptoms include bladder incontinence, headaches, numbness and weakness. Pertinent negatives include no bowel incontinence, chest pain, dysuria, fever, leg pain, paresis, paresthesias, pelvic pain, perianal numbness, tingling or weight loss. She has tried heat and NSAIDs for the symptoms. The treatment provided mild relief.   Pain started in the evening on 7/21 and got steadily worse over the weekend until she decided to go to ER. Had CT scan renal study  But no stone was seen. Urine was cultured and grew multiple organisms.     .Follow up ER visit  Patient was seen in ER for flank pain on 12/28/2015. She was treated for muscle strain. Treatment for this included, labs, ct scan. She reports good compliance with treatment. She reports this condition is Unchanged.  ----------------------------------------------------------------  No visits with results within 1 Day(s) from this visit.  Latest known visit with results is:  Admission on 12/28/2015, Discharged on 12/28/2015  Component Date Value Ref Range Status  . Sodium 12/28/2015 138  135 - 145 mmol/L Final  . Potassium 12/28/2015 4.5  3.5 - 5.1 mmol/L Final  . Chloride 12/28/2015 103  101 - 111 mmol/L Final  . CO2 12/28/2015 30  22 - 32 mmol/L Final  . Glucose,  Bld 12/28/2015 102* 65 - 99 mg/dL Final  . BUN 12/28/2015 13  6 - 20 mg/dL Final  . Creatinine, Ser 12/28/2015 0.94  0.44 - 1.00 mg/dL Final  . Calcium 12/28/2015 9.9  8.9 - 10.3 mg/dL Final  . GFR calc non Af Amer 12/28/2015 57* >60 mL/min Final  . GFR calc Af Amer 12/28/2015 >60  >60 mL/min Final   Comment: (NOTE) The eGFR has been calculated using the CKD EPI equation. This calculation has not been validated in all clinical situations. eGFR's persistently <60 mL/min signify possible Chronic Kidney Disease.   . Anion gap 12/28/2015 5  5 - 15 Final  . WBC 12/28/2015 4.4  3.6 - 11.0 K/uL Final  . RBC 12/28/2015 4.87  3.80 - 5.20 MIL/uL Final  . Hemoglobin 12/28/2015 15.3  12.0 - 16.0 g/dL Final  . HCT 12/28/2015 44.9  35.0 - 47.0 % Final  . MCV 12/28/2015 92.2  80.0 - 100.0 fL Final  . MCH 12/28/2015 31.4  26.0 - 34.0 pg Final  . MCHC 12/28/2015 34.1  32.0 - 36.0 g/dL Final  . RDW 12/28/2015 15.4* 11.5 - 14.5 % Final  . Platelets 12/28/2015 162  150 - 440 K/uL Final  . Color, Urine 12/28/2015 STRAW* YELLOW Final  . APPearance 12/28/2015 CLEAR* CLEAR Final  . Glucose, UA 12/28/2015 NEGATIVE  NEGATIVE mg/dL Final  . Bilirubin Urine 12/28/2015 NEGATIVE  NEGATIVE Final  . Ketones, ur 12/28/2015 NEGATIVE  NEGATIVE mg/dL Final  . Specific Gravity, Urine  12/28/2015 1.005  1.005 - 1.030 Final  . Hgb urine dipstick 12/28/2015 NEGATIVE  NEGATIVE Final  . pH 12/28/2015 8.0  5.0 - 8.0 Final  . Protein, ur 12/28/2015 NEGATIVE  NEGATIVE mg/dL Final  . Nitrite 12/28/2015 NEGATIVE  NEGATIVE Final  . Leukocytes, UA 12/28/2015 2+* NEGATIVE Final  . RBC / HPF 12/28/2015 NONE SEEN  0 - 5 RBC/hpf Final  . WBC, UA 12/28/2015 0-5  0 - 5 WBC/hpf Final  . Bacteria, UA 12/28/2015 RARE* NONE SEEN Final  . Squamous Epithelial / LPF 12/28/2015 0-5* NONE SEEN Final  . Mucous 12/28/2015 PRESENT   Final  . Specimen Description 12/29/2015 URINE, RANDOM   Final  . Special Requests 12/29/2015 NONE   Final  .  Culture 12/29/2015 MULTIPLE SPECIES PRESENT, SUGGEST RECOLLECTION*  Final  . Report Status 12/29/2015 12/29/2015 FINAL   Final     Allergies  Allergen Reactions  . Arthrotec [Diclofenac-Misoprostol] Hives  . Codeine Other (See Comments)    Reaction:  Headache   . Diclofenac     Pt unsure allergy  . Hydrochlorothiazide Other (See Comments)    Reaction:  Weakness   . Morphine Hives  . Penicillins Hives and Other (See Comments)    Has patient had a PCN reaction causing immediate rash, facial/tongue/throat swelling, SOB or lightheadedness with hypotension: No Has patient had a PCN reaction causing severe rash involving mucus membranes or skin necrosis: No Has patient had a PCN reaction that required hospitalization No Has patient had a PCN reaction occurring within the last 10 years: No If all of the above answers are "NO", then may proceed with Cephalosporin use.  . Sulfa Antibiotics Hives  . Tiotropium Bromide Monohydrate Other (See Comments)    Reaction:  Blurry vision   . Tylenol [Acetaminophen]   . Pantoprazole Rash   Current Meds  Medication Sig  . acidophilus (RISAQUAD) CAPS capsule Take 1 capsule by mouth daily.  . albuterol (PROVENTIL) (2.5 MG/3ML) 0.083% nebulizer solution Take 2.5 mg by nebulization 2 (two) times daily as needed for wheezing or shortness of breath.  . amLODipine (NORVASC) 10 MG tablet Take 1 tablet (10 mg total) by mouth daily.  . amLODipine (NORVASC) 5 MG tablet Take 5 mg by mouth daily.  . aspirin EC 325 MG tablet Take 1 tablet (325 mg total) by mouth daily.  . benazepril (LOTENSIN) 5 MG tablet Take 1 tablet (5 mg total) by mouth daily.  . benztropine (COGENTIN) 0.5 MG tablet Take 1 tablet (0.5 mg total) by mouth at bedtime.  . butalbital-acetaminophen-caffeine (FIORICET, ESGIC) 50-325-40 MG tablet TAKE 1 TABLET BY MOUTH EVERY 4 HOURS AS NEEDED FOR HEADACHE  . Calcium Carbonate-Vitamin D (CALCIUM 600+D) 600-400 MG-UNIT tablet Take 1 tablet by mouth 2  (two) times daily.  . Cranberry 250 MG CAPS Take 250 mg by mouth daily.   . fluticasone (FLONASE) 50 MCG/ACT nasal spray Place 1 spray into both nostrils daily.  . Fluticasone-Salmeterol (ADVAIR) 250-50 MCG/DOSE AEPB Inhale 1 puff into the lungs 2 (two) times daily.  . glucosamine-chondroitin 500-400 MG tablet Take 1 tablet by mouth 2 (two) times daily.  . hydrOXYzine (ATARAX/VISTARIL) 10 MG tablet Take 1 tablet (10 mg total) by mouth 2 (two) times daily as needed. (Patient taking differently: Take 10 mg by mouth 2 (two) times daily as needed for anxiety. )  . loratadine (CLARITIN) 10 MG tablet Take 10 mg by mouth daily.  . Magnesium Oxide 250 MG TABS Take 500 mg by mouth at bedtime.  .   Misc Natural Products (OSTEO BI-FLEX JOINT SHIELD PO) Take 1 tablet by mouth 2 (two) times daily.  . montelukast (SINGULAIR) 10 MG tablet Take 10 mg by mouth daily.   . Multiple Vitamin (MULTIVITAMIN WITH MINERALS) TABS tablet Take 1 tablet by mouth daily.  [name] Kitchen omeprazole (PRILOSEC) 20 MG capsule Take 20 mg by mouth daily.  [name] Kitchen oxybutynin (DITROPAN-XL) 10 MG 24 hr tablet Take 10 mg by mouth at bedtime.  . ranitidine (ZANTAC) 150 MG tablet Take 150 mg by mouth at bedtime.  . sertraline (ZOLOFT) 100 MG tablet Take 1 tablet (100 mg total) by mouth at bedtime.  . sucralfate (CARAFATE) 1 g tablet Take 1 tablet (1 g total) by mouth 2 (two) times daily.  . theophylline (UNIPHYL) 400 MG 24 hr tablet Take 400 mg by mouth daily.  [name] Kitchen tiZANidine (ZANAFLEX) 4 MG tablet Take 4 mg by mouth daily.  . traMADol (ULTRAM) 50 MG tablet Take 100 mg by mouth every 8 (eight) hours as needed for moderate pain.  . traZODone (DESYREL) 50 MG tablet Take 2 tablets (100 mg total) by mouth at bedtime.  [name] Kitchen trifluoperazine (STELAZINE) 10 MG tablet Take 1 tablet (10 mg total) by mouth at bedtime. (Patient taking differently: Take 5 mg by mouth at bedtime. )    Review of Systems  Constitutional: Negative for appetite change, chills, fatigue, fever  and weight loss.  Respiratory: Negative for chest tightness and shortness of breath.   Cardiovascular: Negative for chest pain and palpitations.  Gastrointestinal: Negative for bowel incontinence, nausea and vomiting.  Genitourinary: Positive for bladder incontinence. Negative for dysuria and pelvic pain.  Musculoskeletal: Positive for back pain and gait problem.  Neurological: Positive for weakness, numbness and headaches. Negative for dizziness, tingling and paresthesias.    Social History  Substance Use Topics  . Smoking status: Former Smoker    Years: 30.00  . Smokeless tobacco: Never Used     Comment: quit in 1990's  . Alcohol use No   Objective:   BP 130/60 (BP Location: Right Arm, Patient Position: Sitting, Cuff Size: Large)   Pulse 84   Temp 97.9 F (36.6 C) (Oral)   Resp 16   SpO2 96%   Physical Exam   General Appearance:    Alert, cooperative, no distress  Eyes:    PERRL, conjunctiva/corneas clear, EOM's intact       Lungs:     Clear to auscultation bilaterally, respirations unlabored  Heart:    Regular rate and rhythm  Neurologic:   Awake, alert, oriented x 3. No apparent focal neurological           defect.   MS:   + left CVAT, but no minimal musculo-skelatal tenderness.        Assessment & Plan:     1. Left-sided low back pain without sciatica Minimal MS tenderness, I think pain is likely renal and may have mild pyelonephritis. Possibly renal calculi not seen on CT. Consider adding terazosin.  - Urine culture  2. UTI - Urine culture -cipro 546m BID x 10 days.        DLelon Huh MD  [facility]

## 2015-12-31 LAB — URINE CULTURE

## 2016-01-01 ENCOUNTER — Telehealth: Payer: Self-pay

## 2016-01-01 NOTE — Telephone Encounter (Signed)
Patient has been advised. KW 

## 2016-01-01 NOTE — Telephone Encounter (Signed)
No organism on culture. Would stop Cipro unless she feels it is helping with her back pain.

## 2016-01-01 NOTE — Telephone Encounter (Signed)
Patient has called office requesting urine culture results, she has been advised that Dr. Caryn Section is out of the office today. I saw that results are in computer but no annotation has been put in on what to advise patient. Can you please review and advise.  Thanks-KW

## 2016-01-02 ENCOUNTER — Ambulatory Visit: Payer: Self-pay | Admitting: Family Medicine

## 2016-01-06 ENCOUNTER — Ambulatory Visit: Payer: Self-pay | Admitting: Family Medicine

## 2016-01-07 ENCOUNTER — Telehealth: Payer: Self-pay | Admitting: Family Medicine

## 2016-01-07 NOTE — Telephone Encounter (Signed)
Please advise 

## 2016-01-07 NOTE — Telephone Encounter (Signed)
Pt called wanting to know if we have gotten the results back from her UC from last week.  Please advise.  [phone]  Thank sTeri

## 2016-01-09 NOTE — Telephone Encounter (Signed)
Patient was notified of results. Expressed understanding.  

## 2016-01-09 NOTE — Telephone Encounter (Signed)
Culture show very mild infection. Should be cleared up with cipro that was prescribed.

## 2016-01-13 ENCOUNTER — Other Ambulatory Visit: Payer: Self-pay | Admitting: Psychiatry

## 2016-01-14 ENCOUNTER — Ambulatory Visit: Payer: Self-pay | Admitting: Family Medicine

## 2016-01-15 ENCOUNTER — Telehealth: Payer: Self-pay

## 2016-01-15 ENCOUNTER — Other Ambulatory Visit: Payer: Self-pay | Admitting: Psychiatry

## 2016-01-15 NOTE — Telephone Encounter (Signed)
phamacy called pt needs refills on trazodone .

## 2016-01-15 NOTE — Telephone Encounter (Signed)
called in ok for trazodone refills. pt last seen on 12-17-15 next appt  03-16-16) called in #[phone] with 3 refills.

## 2016-01-20 ENCOUNTER — Ambulatory Visit: Payer: Self-pay | Admitting: Family Medicine

## 2016-01-29 ENCOUNTER — Other Ambulatory Visit: Payer: Self-pay | Admitting: Family Medicine

## 2016-01-30 NOTE — Telephone Encounter (Signed)
Please call in Fioricet.  

## 2016-01-30 NOTE — Telephone Encounter (Signed)
Last refill 10/07/2015. LOV was 12/29/2015. Renaldo Fiddler, CMA

## 2016-01-30 NOTE — Telephone Encounter (Signed)
Called in. Emily Drozdowski, CMA  

## 2016-02-05 ENCOUNTER — Ambulatory Visit (INDEPENDENT_AMBULATORY_CARE_PROVIDER_SITE_OTHER): Payer: Commercial Managed Care - HMO | Admitting: Psychiatry

## 2016-02-05 ENCOUNTER — Encounter: Payer: Self-pay | Admitting: Psychiatry

## 2016-02-05 VITALS — BP 158/77 | HR 82 | Temp 97.7°F

## 2016-02-05 DIAGNOSIS — F209 Schizophrenia, unspecified: Secondary | ICD-10-CM | POA: Diagnosis not present

## 2016-02-05 MED ORDER — TRAZODONE HCL 50 MG PO TABS: 100.0000 mg | ORAL_TABLET | Freq: Every day | ORAL | 2 refills | Status: DC

## 2016-02-05 MED ORDER — SERTRALINE HCL 100 MG PO TABS: 100.0000 mg | ORAL_TABLET | Freq: Every day | ORAL | 2 refills | Status: DC

## 2016-02-05 MED ORDER — ARIPIPRAZOLE 2 MG PO TABS: 2.0000 mg | ORAL_TABLET | Freq: Every day | ORAL | 2 refills | Status: DC

## 2016-02-05 NOTE — Progress Notes (Signed)
Patient ID: Vicki Morales, female   DOB: Apr 29, 1941, 75 y.o.   MRN: [id number]   [facility] MD/PA/NP OP Progress Note  02/05/2016 4:17 PM Vicki Morales  MRN:  [id number]  Subjective:  Patient presents for follow-up of her schizophrenia. Patient presents with her daughter today.her daughter patient has not been doing well since she stopped taking the Stelazine. States that over the past 3 weeks patient has been more depressed and not motivated. She is to enjoy doing some activities and she has not engaged in those. She has been more listless and lying in bed. Patient today reports that she did like taking the Abilify many years ago and would like to try that. We discussed starting her on a low dose. Patient also reports taking Vistaril every day at night and we discussed taking it only as needed. She is been sleeping okay and eating okay. She denies any suicidal thoughts currently.    Chief Complaint: Depressed mood since stopping the Stelazine. Chief Complaint    Follow-up; Medication Refill     Visit Diagnosis:   No diagnosis found.  Past Medical History:  Past Medical History:  Diagnosis Date  . Anxiety   . COPD (chronic obstructive pulmonary disease) ([place])   . Depression   . GERD (gastroesophageal reflux disease)   . Hypertension     Past Surgical History:  Procedure Laterality Date  . ABDOMINAL HYSTERECTOMY  1988  . APPENDECTOMY  1988  . ESOPHAGOGASTRODUODENOSCOPY N/A 11/11/2014   Procedure: ESOPHAGOGASTRODUODENOSCOPY (EGD);  Surgeon: Josefine Class, MD;  Location: [facility] ENDOSCOPY;  Service: Endoscopy;  Laterality: N/A;  . TONSILLECTOMY AND ADENOIDECTOMY    . TRIGGER FINGER RELEASE  2004   Family History:  Family History  Problem Relation Age of Onset  . Breast cancer Sister   . Stroke Father   . Emphysema Father   . Anxiety disorder Father   . Depression Father   . Anxiety disorder Brother    Social History:  Social History   Social History  . Marital status: Widowed     Spouse name: N/A  . Number of children: N/A  . Years of education: N/A   Social History Main Topics  . Smoking status: Former Smoker    Years: 30.00  . Smokeless tobacco: Never Used     Comment: quit in 1990's  . Alcohol use No  . Drug use: No  . Sexual activity: Not Currently   Other Topics Concern  . None   Social History Narrative  . None   Additional History:   Assessment:   Musculoskeletal: Strength & Muscle Tone: within normal limits Gait & Station: Slow and walks with a cane Patient leans: N/A  Psychiatric Specialty Exam: HPI  Review of Systems  Psychiatric/Behavioral: Negative for depression, hallucinations, memory loss, substance abuse and suicidal ideas. The patient is not nervous/anxious and does not have insomnia.   All other systems reviewed and are negative.   Blood pressure (!) 158/77, pulse 82, temperature 97.7 F (36.5 C), temperature source Oral.There is no height or weight on file to calculate BMI.  General Appearance: Well Groomed  Eye Contact:  Good  Speech:  Normal Rate  Volume:  Normal  Mood:  Somewhat depressed recently   Affect:  Congruent Bright, smiling   Thought Process:  Linear and Logical  Orientation:  Full (Time, Place, and Person)  Thought Content:  Negative  Suicidal Thoughts:  No  Homicidal Thoughts:  No  Memory:  Immediate;   Good Recent;  Good Remote;   Good  Judgement:  Good  Insight:  Good  Psychomotor Activity:  Normal  Concentration:  Good  Recall:  Good  Fund of Knowledge: Good  Language: Good  Akathisia:  Negative  Handed:    AIMS (if indicated):  Mild cogwheeling noted on right arm   Assets:  Communication Skills Desire for Improvement  ADL's:  Intact  Cognition: WNL  Sleep:  Good states 12 midnight to 8am last night   Is the patient at risk to self?  No. Has the patient been a risk to self in the past 6 months?  No. Has the patient been a risk to self within the distant past?  Yes.  OD in 1979 Is  the patient a risk to others?  No. Has the patient been a risk to others in the past 6 months?  No. Has the patient been a risk to others within the distant past?  No.  Current Medications: Current Outpatient Prescriptions  Medication Sig Dispense Refill  . acidophilus (RISAQUAD) CAPS capsule Take 1 capsule by mouth daily.    [name] Kitchen albuterol (PROVENTIL) (2.5 MG/3ML) 0.083% nebulizer solution Take 2.5 mg by nebulization 2 (two) times daily as needed for wheezing or shortness of breath.    [name] Kitchen amLODipine (NORVASC) 10 MG tablet Take 1 tablet (10 mg total) by mouth daily. 30 tablet 3  . amLODipine (NORVASC) 5 MG tablet Take 5 mg by mouth daily.    [name] Kitchen aspirin EC 325 MG tablet Take 1 tablet (325 mg total) by mouth daily. 30 tablet 2  . benazepril (LOTENSIN) 5 MG tablet Take 1 tablet (5 mg total) by mouth daily. 30 tablet 5  . benztropine (COGENTIN) 0.5 MG tablet Take 1 tablet (0.5 mg total) by mouth at bedtime. 60 tablet 2  . butalbital-acetaminophen-caffeine (FIORICET, ESGIC) 50-325-40 MG tablet TAKE 1 TABLET BY MOUTH EVERY 4 HOURS AS NEEDED FOR HEADACHE 30 tablet 5  . Calcium Carbonate-Vitamin D (CALCIUM 600+D) 600-400 MG-UNIT tablet Take 1 tablet by mouth 2 (two) times daily.    . Cranberry 250 MG CAPS Take 250 mg by mouth daily.     . fluticasone (FLONASE) 50 MCG/ACT nasal spray Place 1 spray into both nostrils daily.    . Fluticasone-Salmeterol (ADVAIR) 250-50 MCG/DOSE AEPB Inhale 1 puff into the lungs 2 (two) times daily.    [name] Kitchen glucosamine-chondroitin 500-400 MG tablet Take 1 tablet by mouth 2 (two) times daily.    . hydrOXYzine (ATARAX/VISTARIL) 10 MG tablet Take 1 tablet (10 mg total) by mouth 2 (two) times daily as needed. (Patient taking differently: Take 10 mg by mouth 2 (two) times daily as needed for anxiety. ) 60 tablet 1  . loratadine (CLARITIN) 10 MG tablet Take 10 mg by mouth daily.    . Magnesium Oxide 250 MG TABS Take 500 mg by mouth at bedtime.    . Misc Natural Products (OSTEO BI-FLEX  JOINT SHIELD PO) Take 1 tablet by mouth 2 (two) times daily.    . montelukast (SINGULAIR) 10 MG tablet Take 10 mg by mouth daily.     . Multiple Vitamin (MULTIVITAMIN WITH MINERALS) TABS tablet Take 1 tablet by mouth daily.    [name] Kitchen omeprazole (PRILOSEC) 20 MG capsule Take 20 mg by mouth daily.    [name] Kitchen oxybutynin (DITROPAN-XL) 10 MG 24 hr tablet Take 10 mg by mouth at bedtime.    . ranitidine (ZANTAC) 150 MG tablet Take 150 mg by mouth at bedtime.    . sertraline (ZOLOFT)  100 MG tablet Take 1 tablet (100 mg total) by mouth at bedtime. 30 tablet 2  . sucralfate (CARAFATE) 1 g tablet Take 1 tablet (1 g total) by mouth 2 (two) times daily. 20 tablet 0  . theophylline (UNIPHYL) 400 MG 24 hr tablet Take 400 mg by mouth daily.    [name] Kitchen tiZANidine (ZANAFLEX) 4 MG tablet Take 4 mg by mouth daily.    . traMADol (ULTRAM) 50 MG tablet Take 100 mg by mouth every 8 (eight) hours as needed for moderate pain.    . traZODone (DESYREL) 50 MG tablet Take 2 tablets (100 mg total) by mouth at bedtime. 60 tablet 2  . trifluoperazine (STELAZINE) 10 MG tablet Take 1 tablet (10 mg total) by mouth at bedtime. (Patient taking differently: Take 5 mg by mouth at bedtime. ) 30 tablet 2   No current facility-administered medications for this visit.     Medical Decision Making:  Established Problem, Stable/Improving (1) and Review of Medication Regimen & Side Effects (2)  Treatment Plan Summary:Medication management and Plan Plan Schizophrenia-  Continue sertraline at 100 mg daily, her trazodone at 100 mg at bedtime.  Discontinue the Stelazine  And recommended not to take the Vistaril every day and just to take it as needed sometimes for anxiety  Start Abilify at 2 mg once daily. Side effects such as long-term weight gain, metabolic side effects and movement disorders discussed with patient and her daughter.  She will follow-up in 1 months or call before if needed  Karson Reede 02/05/2016, 4:17 PM

## 2016-02-11 ENCOUNTER — Other Ambulatory Visit: Payer: Self-pay | Admitting: Family Medicine

## 2016-02-16 ENCOUNTER — Telehealth: Payer: Self-pay | Admitting: Family Medicine

## 2016-02-16 MED ORDER — TIZANIDINE HCL 4 MG PO TABS: 4.0000 mg | ORAL_TABLET | Freq: Every day | ORAL | 4 refills | Status: DC

## 2016-02-16 NOTE — Telephone Encounter (Signed)
Please advise 

## 2016-02-16 NOTE — Telephone Encounter (Signed)
Pt called asking for a refill on her hydroxyzine She has an appointment tomorrow but does not want to come in if she does not have to.  She just said her nerves are worse and she needs to get an early refill  Her call back is [phone]  Thanks Con Memos

## 2016-02-16 NOTE — Telephone Encounter (Signed)
Pt contacted office for refill request on the following medications:  tiZANidine (ZANAFLEX) 4 MG tablet.  [facility].  [phone]

## 2016-02-17 ENCOUNTER — Other Ambulatory Visit: Payer: Self-pay | Admitting: Family Medicine

## 2016-02-17 ENCOUNTER — Ambulatory Visit: Payer: Self-pay | Admitting: Family Medicine

## 2016-02-17 MED ORDER — HYDROXYZINE HCL 10 MG PO TABS: 10.0000 mg | ORAL_TABLET | Freq: Two times a day (BID) | ORAL | 5 refills | Status: DC | PRN

## 2016-02-17 NOTE — Telephone Encounter (Signed)
When Pt called yesterday she first requested a refill on hydrOXYzine (ATARAX/VISTARIL) 10 MG tablet. Then pt called back and asked for tiZANidine (ZANAFLEX) 4 MG tablet as well. Tizanidine was sent to pharmacy but Hydroxyzine wasn't. Pt is requesting a refill for Hydroxyzine be sent to [facility]. Pt also had appt this afternoon but she canceled the appt b/c she stated her head hurt and she will call back to reschedule. Last written: 12/17/15 with 1 refill Please advise. Thanks TNP

## 2016-02-28 ENCOUNTER — Other Ambulatory Visit: Payer: Self-pay | Admitting: Family Medicine

## 2016-02-29 ENCOUNTER — Other Ambulatory Visit: Payer: Self-pay | Admitting: Family Medicine

## 2016-02-29 NOTE — Telephone Encounter (Signed)
Please call in tramadol.  

## 2016-03-01 NOTE — Telephone Encounter (Signed)
Please call in

## 2016-03-01 NOTE — Telephone Encounter (Signed)
Rx called in to pharmacy. 

## 2016-03-01 NOTE — Telephone Encounter (Signed)
Prescription phoned into pharmacy.

## 2016-03-02 ENCOUNTER — Telehealth: Payer: Self-pay | Admitting: Family Medicine

## 2016-03-02 NOTE — Telephone Encounter (Signed)
Please review. Thanks!  

## 2016-03-02 NOTE — Telephone Encounter (Signed)
Advised patient as below. She reports that she called the pharmacy and they have Rx ready.

## 2016-03-02 NOTE — Telephone Encounter (Signed)
Dr. Caryn Section gave her 4 refills so she can get that filled for now and readress with him next week as I do not know if he wants her on that many.

## 2016-03-02 NOTE — Telephone Encounter (Signed)
Pt states she is having to take 3 tiZANidine (ZANAFLEX) 4 MG tablets a day and has run out out of medication.  Pt is asking if she can get an additional refill.   [facility].  [phone]

## 2016-03-09 ENCOUNTER — Other Ambulatory Visit: Payer: Self-pay | Admitting: Family Medicine

## 2016-03-09 NOTE — Telephone Encounter (Signed)
Pt contacted office for refill request on the following medications:  [facility].  [phone]  hydrOXYzine (ATARAX/VISTARIL) 10 MG tablet-Pt is requesting increased to 20mg .  Pt states this was 20mg  before.  hydrOXYzine (ATARAX/VISTARIL) 10 MG tablet-Pt is requesting this inceased to 3 pills a day.  Pt states she is taking 3 pills at one time to control her legs.

## 2016-03-10 MED ORDER — HYDROXYZINE HCL 25 MG PO TABS: 25.0000 mg | ORAL_TABLET | Freq: Three times a day (TID) | ORAL | 5 refills | Status: DC | PRN

## 2016-03-10 NOTE — Telephone Encounter (Signed)
Please advise have changed to 25mg  tablet, can only take one of these every eight hours at most.

## 2016-03-16 ENCOUNTER — Emergency Department: Payer: BLUE CROSS/BLUE SHIELD

## 2016-03-16 ENCOUNTER — Encounter: Payer: Self-pay | Admitting: Emergency Medicine

## 2016-03-16 ENCOUNTER — Emergency Department
Admission: EM | Admit: 2016-03-16 | Discharge: 2016-03-17 | Payer: BLUE CROSS/BLUE SHIELD | Attending: Student | Admitting: Student

## 2016-03-16 ENCOUNTER — Ambulatory Visit: Payer: No Typology Code available for payment source | Admitting: Psychiatry

## 2016-03-16 DIAGNOSIS — T391X2A Poisoning by 4-Aminophenol derivatives, intentional self-harm, initial encounter: Secondary | ICD-10-CM | POA: Diagnosis not present

## 2016-03-16 DIAGNOSIS — F209 Schizophrenia, unspecified: Secondary | ICD-10-CM

## 2016-03-16 DIAGNOSIS — Z79899 Other long term (current) drug therapy: Secondary | ICD-10-CM | POA: Insufficient documentation

## 2016-03-16 DIAGNOSIS — T50992A Poisoning by other drugs, medicaments and biological substances, intentional self-harm, initial encounter: Secondary | ICD-10-CM | POA: Insufficient documentation

## 2016-03-16 DIAGNOSIS — R45851 Suicidal ideations: Secondary | ICD-10-CM | POA: Diagnosis not present

## 2016-03-16 DIAGNOSIS — I10 Essential (primary) hypertension: Secondary | ICD-10-CM | POA: Insufficient documentation

## 2016-03-16 DIAGNOSIS — J449 Chronic obstructive pulmonary disease, unspecified: Secondary | ICD-10-CM | POA: Insufficient documentation

## 2016-03-16 DIAGNOSIS — F203 Undifferentiated schizophrenia: Secondary | ICD-10-CM

## 2016-03-16 DIAGNOSIS — T887XXA Unspecified adverse effect of drug or medicament, initial encounter: Secondary | ICD-10-CM | POA: Diagnosis not present

## 2016-03-16 DIAGNOSIS — T43212A Poisoning by selective serotonin and norepinephrine reuptake inhibitors, intentional self-harm, initial encounter: Secondary | ICD-10-CM | POA: Diagnosis not present

## 2016-03-16 DIAGNOSIS — Z7982 Long term (current) use of aspirin: Secondary | ICD-10-CM | POA: Insufficient documentation

## 2016-03-16 DIAGNOSIS — T50901A Poisoning by unspecified drugs, medicaments and biological substances, accidental (unintentional), initial encounter: Secondary | ICD-10-CM

## 2016-03-16 DIAGNOSIS — Z915 Personal history of self-harm: Secondary | ICD-10-CM

## 2016-03-16 DIAGNOSIS — Z9151 Personal history of suicidal behavior: Secondary | ICD-10-CM

## 2016-03-16 DIAGNOSIS — T50902A Poisoning by unspecified drugs, medicaments and biological substances, intentional self-harm, initial encounter: Secondary | ICD-10-CM | POA: Diagnosis not present

## 2016-03-16 DIAGNOSIS — Z87891 Personal history of nicotine dependence: Secondary | ICD-10-CM | POA: Diagnosis not present

## 2016-03-16 DIAGNOSIS — T402X5A Adverse effect of other opioids, initial encounter: Secondary | ICD-10-CM | POA: Diagnosis not present

## 2016-03-16 DIAGNOSIS — Y658 Other specified misadventures during surgical and medical care: Secondary | ICD-10-CM | POA: Diagnosis not present

## 2016-03-16 LAB — URINALYSIS COMPLETE WITH MICROSCOPIC (ARMC ONLY)
BILIRUBIN URINE: NEGATIVE
Bacteria, UA: NONE SEEN
GLUCOSE, UA: NEGATIVE mg/dL
Hgb urine dipstick: NEGATIVE
LEUKOCYTES UA: NEGATIVE
NITRITE: NEGATIVE
Protein, ur: NEGATIVE mg/dL
SPECIFIC GRAVITY, URINE: 1.024 (ref 1.005–1.030)
Squamous Epithelial / LPF: NONE SEEN
pH: 5 (ref 5.0–8.0)

## 2016-03-16 LAB — URINE DRUG SCREEN, QUALITATIVE (ARMC ONLY)
Amphetamines, Ur Screen: NOT DETECTED
BARBITURATES, UR SCREEN: POSITIVE — AB
BENZODIAZEPINE, UR SCRN: NOT DETECTED
Cannabinoid 50 Ng, Ur ~~LOC~~: NOT DETECTED
Cocaine Metabolite,Ur ~~LOC~~: NOT DETECTED
MDMA (Ecstasy)Ur Screen: NOT DETECTED
METHADONE SCREEN, URINE: NOT DETECTED
Opiate, Ur Screen: NOT DETECTED
Phencyclidine (PCP) Ur S: NOT DETECTED
TRICYCLIC, UR SCREEN: NOT DETECTED

## 2016-03-16 LAB — CBC WITH DIFFERENTIAL/PLATELET
BASOS ABS: 0 10*3/uL (ref 0–0.1)
BASOS PCT: 0 %
Eosinophils Absolute: 0 10*3/uL (ref 0–0.7)
Eosinophils Relative: 0 %
HEMATOCRIT: 49.3 % — AB (ref 35.0–47.0)
HEMOGLOBIN: 16.2 g/dL — AB (ref 12.0–16.0)
Lymphocytes Relative: 33 %
Lymphs Abs: 1.2 10*3/uL (ref 1.0–3.6)
MCH: 30.4 pg (ref 26.0–34.0)
MCHC: 32.8 g/dL (ref 32.0–36.0)
MCV: 92.8 fL (ref 80.0–100.0)
MONOS PCT: 5 %
Monocytes Absolute: 0.2 10*3/uL (ref 0.2–0.9)
NEUTROS ABS: 2.2 10*3/uL (ref 1.4–6.5)
NEUTROS PCT: 62 %
Platelets: 169 10*3/uL (ref 150–440)
RBC: 5.31 MIL/uL — ABNORMAL HIGH (ref 3.80–5.20)
RDW: 14.1 % (ref 11.5–14.5)
WBC: 3.6 10*3/uL (ref 3.6–11.0)

## 2016-03-16 LAB — COMPREHENSIVE METABOLIC PANEL
ALBUMIN: 4.1 g/dL (ref 3.5–5.0)
ALK PHOS: 82 U/L (ref 38–126)
ALK PHOS: 64 U/L (ref 38–126)
ALT: 14 U/L (ref 14–54)
ALT: 13 U/L — AB (ref 14–54)
AST: 23 U/L (ref 15–41)
AST: 19 U/L (ref 15–41)
Albumin: 3.3 g/dL — ABNORMAL LOW (ref 3.5–5.0)
Anion gap: 9 (ref 5–15)
Anion gap: 8 (ref 5–15)
BILIRUBIN TOTAL: 0.6 mg/dL (ref 0.3–1.2)
BILIRUBIN TOTAL: 0.8 mg/dL (ref 0.3–1.2)
BUN: 15 mg/dL (ref 6–20)
BUN: 14 mg/dL (ref 6–20)
CALCIUM: 9.5 mg/dL (ref 8.9–10.3)
CHLORIDE: 101 mmol/L (ref 101–111)
CO2: 25 mmol/L (ref 22–32)
CO2: 23 mmol/L (ref 22–32)
CREATININE: 0.78 mg/dL (ref 0.44–1.00)
Calcium: 9.1 mg/dL (ref 8.9–10.3)
Chloride: 98 mmol/L — ABNORMAL LOW (ref 101–111)
Creatinine, Ser: 0.92 mg/dL (ref 0.44–1.00)
GFR calc Af Amer: >60 mL/min (ref >60)
GFR calc Af Amer: >60 mL/min (ref >60)
GFR calc non Af Amer: 59 mL/min — ABNORMAL LOW (ref >60)
GLUCOSE: 138 mg/dL — AB (ref 65–99)
Glucose, Bld: 100 mg/dL — ABNORMAL HIGH (ref 65–99)
Potassium: 4 mmol/L (ref 3.5–5.1)
Potassium: 4 mmol/L (ref 3.5–5.1)
Sodium: 132 mmol/L — ABNORMAL LOW (ref 135–145)
Sodium: 132 mmol/L — ABNORMAL LOW (ref 135–145)
TOTAL PROTEIN: 7 g/dL (ref 6.5–8.1)
Total Protein: 5.5 g/dL — ABNORMAL LOW (ref 6.5–8.1)

## 2016-03-16 LAB — SALICYLATE LEVEL: Salicylate Lvl: <7 mg/dL (ref 2.8–30.0)

## 2016-03-16 LAB — ACETAMINOPHEN LEVEL
ACETAMINOPHEN (TYLENOL), SERUM: 48 ug/mL — AB (ref 10–30)
ACETAMINOPHEN (TYLENOL), SERUM: 20 ug/mL (ref 10–30)

## 2016-03-16 LAB — ETHANOL: Alcohol, Ethyl (B): <5 mg/dL (ref <5)

## 2016-03-16 MED ORDER — BENAZEPRIL HCL 10 MG PO TABS
ORAL_TABLET | ORAL | Status: AC
  Administered 2016-03-16: 5 mg via ORAL
  Filled 2016-03-16: qty 1

## 2016-03-16 MED ORDER — PANTOPRAZOLE SODIUM 40 MG PO TBEC
40.0000 mg | DELAYED_RELEASE_TABLET | Freq: Every day | ORAL | Status: DC
  Administered 2016-03-16 – 2016-03-17 (×2): 40 mg via ORAL
  Filled 2016-03-16: qty 1

## 2016-03-16 MED ORDER — ASPIRIN EC 325 MG PO TBEC
DELAYED_RELEASE_TABLET | ORAL | Status: AC
  Administered 2016-03-16: 325 mg
  Filled 2016-03-16: qty 1

## 2016-03-16 MED ORDER — PANTOPRAZOLE SODIUM 40 MG PO TBEC
DELAYED_RELEASE_TABLET | ORAL | Status: AC
  Administered 2016-03-16: 40 mg via ORAL
  Filled 2016-03-16: qty 1

## 2016-03-16 MED ORDER — ALBUTEROL SULFATE (2.5 MG/3ML) 0.083% IN NEBU
2.5000 mg | INHALATION_SOLUTION | RESPIRATORY_TRACT | Status: DC | PRN
  Administered 2016-03-16 – 2016-03-17 (×2): 2.5 mg via RESPIRATORY_TRACT
  Filled 2016-03-16 (×2): qty 3

## 2016-03-16 MED ORDER — SERTRALINE HCL 100 MG PO TABS
100.0000 mg | ORAL_TABLET | Freq: Every day | ORAL | Status: DC
  Administered 2016-03-16: 100 mg via ORAL
  Filled 2016-03-16: qty 1

## 2016-03-16 MED ORDER — ASPIRIN 325 MG PO TABS
325.0000 mg | ORAL_TABLET | Freq: Every day | ORAL | Status: DC
  Administered 2016-03-17: 325 mg via ORAL
  Filled 2016-03-16: qty 1

## 2016-03-16 MED ORDER — TIZANIDINE HCL 2 MG PO TABS
4.0000 mg | ORAL_TABLET | Freq: Four times a day (QID) | ORAL | Status: DC | PRN
  Administered 2016-03-16 – 2016-03-17 (×2): 4 mg via ORAL
  Filled 2016-03-16 (×2): qty 2

## 2016-03-16 MED ORDER — FLUTICASONE PROPIONATE 50 MCG/ACT NA SUSP
2.0000 | Freq: Every day | NASAL | Status: DC
  Administered 2016-03-16: 2 via NASAL
  Filled 2016-03-16: qty 16

## 2016-03-16 MED ORDER — MOMETASONE FURO-FORMOTEROL FUM 100-5 MCG/ACT IN AERO
2.0000 | INHALATION_SPRAY | Freq: Two times a day (BID) | RESPIRATORY_TRACT | Status: DC
  Filled 2016-03-16 (×2): qty 8.8

## 2016-03-16 MED ORDER — MONTELUKAST SODIUM 10 MG PO TABS
10.0000 mg | ORAL_TABLET | Freq: Every day | ORAL | Status: DC
  Administered 2016-03-16: 10 mg via ORAL
  Filled 2016-03-16: qty 1

## 2016-03-16 MED ORDER — OXYBUTYNIN CHLORIDE ER 5 MG PO TB24
5.0000 mg | ORAL_TABLET | Freq: Every day | ORAL | Status: DC
  Administered 2016-03-16: 5 mg via ORAL
  Filled 2016-03-16 (×2): qty 1

## 2016-03-16 MED ORDER — TRIFLUOPERAZINE HCL 5 MG PO TABS: 5.0000 mg | ORAL_TABLET | Freq: Every day | ORAL | 1 refills | Status: DC

## 2016-03-16 MED ORDER — LORATADINE 10 MG PO TABS
10.0000 mg | ORAL_TABLET | Freq: Every day | ORAL | Status: DC
  Administered 2016-03-16: 10 mg via ORAL

## 2016-03-16 MED ORDER — SODIUM CHLORIDE 0.9 % IV BOLUS (SEPSIS)
1000.0000 mL | Freq: Once | INTRAVENOUS | Status: AC
  Administered 2016-03-16: 1000 mL via INTRAVENOUS

## 2016-03-16 MED ORDER — SODIUM CHLORIDE 0.9 % IV BOLUS (SEPSIS)
500.0000 mL | Freq: Once | INTRAVENOUS | Status: AC
  Administered 2016-03-16: 500 mL via INTRAVENOUS

## 2016-03-16 MED ORDER — AMLODIPINE BESYLATE 5 MG PO TABS
10.0000 mg | ORAL_TABLET | Freq: Every day | ORAL | Status: DC
  Administered 2016-03-16 – 2016-03-17 (×2): 10 mg via ORAL
  Filled 2016-03-16: qty 2

## 2016-03-16 MED ORDER — SUCRALFATE 1 G PO TABS
1.0000 g | ORAL_TABLET | Freq: Three times a day (TID) | ORAL | Status: DC
  Administered 2016-03-16 – 2016-03-17 (×2): 1 g via ORAL
  Filled 2016-03-16 (×2): qty 1

## 2016-03-16 MED ORDER — LORATADINE 10 MG PO TABS
ORAL_TABLET | ORAL | Status: AC
  Administered 2016-03-16: 10 mg via ORAL
  Filled 2016-03-16: qty 1

## 2016-03-16 MED ORDER — TRIFLUOPERAZINE HCL 10 MG PO TABS
5.0000 mg | ORAL_TABLET | Freq: Every day | ORAL | Status: DC
  Administered 2016-03-16: 5 mg via ORAL
  Filled 2016-03-16 (×2): qty 1

## 2016-03-16 MED ORDER — TRAZODONE HCL 50 MG PO TABS
50.0000 mg | ORAL_TABLET | Freq: Every day | ORAL | Status: DC
  Administered 2016-03-16: 50 mg via ORAL
  Filled 2016-03-16: qty 1

## 2016-03-16 MED ORDER — BENAZEPRIL HCL 10 MG PO TABS
5.0000 mg | ORAL_TABLET | Freq: Every day | ORAL | Status: DC
  Administered 2016-03-16 – 2016-03-17 (×2): 5 mg via ORAL
  Filled 2016-03-16: qty 1

## 2016-03-16 MED ORDER — AMLODIPINE BESYLATE 5 MG PO TABS
ORAL_TABLET | ORAL | Status: AC
  Administered 2016-03-16: 10 mg via ORAL
  Filled 2016-03-16: qty 2

## 2016-03-16 NOTE — ED Notes (Signed)
Pt family given pt visiting hours sheet and made aware visiting hours are over and told to call and check on pt as she would like. Pt daughter requests that she be taken home to take care of instead of psych hospital once psych consult has been completed.

## 2016-03-16 NOTE — BH Assessment (Signed)
Tele Assessment Note   Vicki Morales is an 75 y.o. female, Caucasian, who presents to [facility] per ED report: history of anxiety, depression, COPD, pulmonary hypertension who presents for evaluation after intentional overdose on prescribed medications with suicidal intent, gradual onset yesterday, constant, severe, no modifying factors. Patient reports that she took an unknown amount of tramadol, hydroxyzine and Vistaril. She reports that she took these medications "to try and save the world "and because "I just wanted to move on". She denies any chest pain or difficulty breathing, no nausea vomiting diarrhea fevers or chills. She reports that her daughter was the one who called EMS.  Patient daughter was present during assessment per pt. Request. Patient states that her primary concern is of depression and medication adjustment. Patient states that she did not have an intent to commit suicide when taking medications, but kept statin that, " I did it to save the World." When asked of meaning pt stated that she is not crazy and that she wanted to be around to help daughter and carry on in life as well as to do a miracle for people to listen to her. Per daughter, does not understand or know what the sate ment about save the world means. Per daughter does not believe this was suicide attempt, and believes that pt. Needs to eb back on prior medication that was working for depression. Patient states that she has not slept well in or around x 1 month since changing medications. Patient states that she does not want to go to [facility] or other facilities that will "turn me into a vegetable." Per daughter, feels safe with pt. At home and states will help manage pt. Medication.   Patient denies current SI or plan. Patient denies current or hx. Of HI and AVH. Patient denies hx. Of or current S.A. Patient acknowledges past inpatient psych care at [facility] in or around 1980 for depression. Patient acknowledges current tx for  psych care with Dr. Einar Grad. Per daughter pt. Missed appt. Today, but daughter went to see Dr. Einar Grad regarding medication adjustment.   Patient is dressed in hospital gown and is alert and oriented x4. Patient speech was within normal limits and motor behavior appeared normal. Patient thought process is coherent. Patient does not appear to be responding to internal stimuli. Patient was cooperative throughout the assessment.   Diagnosis: Major Depressive Disorder  Past Medical History:  Past Medical History:  Diagnosis Date  . Anxiety   . COPD (chronic obstructive pulmonary disease) ([place])   . Depression   . GERD (gastroesophageal reflux disease)   . Hypertension     Past Surgical History:  Procedure Laterality Date  . ABDOMINAL HYSTERECTOMY  1988  . APPENDECTOMY  1988  . ESOPHAGOGASTRODUODENOSCOPY N/A 11/11/2014   Procedure: ESOPHAGOGASTRODUODENOSCOPY (EGD);  Surgeon: Josefine Class, MD;  Location[facility] ENDOSCOPY;  Service: Endoscopy;  Laterality: N/A;  . TONSILLECTOMY AND ADENOIDECTOMY    . TRIGGER FINGER RELEASE  2004    Family History:  Family History  Problem Relation Age of Onset  . Breast cancer Sister   . Stroke Father   . Emphysema Father   . Anxiety disorder Father   . Depression Father   . Anxiety disorder Brother     Social History:  reports that she has quit smoking. She quit after 30.00 years of use. She has never used smokeless tobacco. She reports that she does not drink alcohol or use drugs.  Additional Social History:  Alcohol / Drug Use  Pain Medications: SEE MAR Prescriptions: SEE MAR Over the Counter: SEE MAR History of alcohol / drug use?: No history of alcohol / drug abuse  CIWA: CIWA-Ar BP: (!) 172/97 Pulse Rate: 73 COWS:    PATIENT STRENGTHS: (choose at least two) Average or above average intelligence Communication skills General fund of knowledge  Allergies:  Allergies  Allergen Reactions  . Arthrotec [Diclofenac-Misoprostol] Hives  .  Codeine Other (See Comments)    Reaction:  Headache   . Diclofenac     Pt unsure allergy  . Hydrochlorothiazide Other (See Comments)    Reaction:  Weakness   . Morphine Hives  . Penicillins Hives and Other (See Comments)    Has patient had a PCN reaction causing immediate rash, facial/tongue/throat swelling, SOB or lightheadedness with hypotension: No Has patient had a PCN reaction causing severe rash involving mucus membranes or skin necrosis: No Has patient had a PCN reaction that required hospitalization No Has patient had a PCN reaction occurring within the last 10 years: No If all of the above answers are "NO", then may proceed with Cephalosporin use.  . Sulfa Antibiotics Hives  . Tiotropium Bromide Monohydrate Other (See Comments)    Reaction:  Blurry vision   . Tylenol [Acetaminophen]   . Pantoprazole Rash    Home Medications:  (Not in a hospital admission)  OB/GYN Status:  No LMP recorded. Patient is postmenopausal.  General Assessment Data Location of Assessment: [facility] ED TTS Assessment: In system Is this a Tele or Face-to-Face Assessment?: Face-to-Face Is this an Initial Assessment or a Re-assessment for this encounter?: Initial Assessment Marital status: Single Maiden name: n/a Is patient pregnant?: No Pregnancy Status: No Living Arrangements: Other relatives Can pt return to current living arrangement?: Yes Admission Status: Involuntary Is patient capable of signing voluntary admission?: Yes Referral Source: Other Insurance type: BCBS     Crisis Care Plan Living Arrangements: Other relatives Legal Guardian: Other: Name of Psychiatrist: Dr. Einar Grad Name of Therapist: none  Education Status Is patient currently in school?: No Current Grade: n/a Highest grade of school patient has completed: unknown Name of school: n/a Contact person: daughter [name]  Risk to self with the past 6 months Suicidal Ideation: No Has patient been a risk to self within the  past 6 months prior to admission? : No Suicidal Intent: No Has patient had any suicidal intent within the past 6 months prior to admission? : No Is patient at risk for suicide?: Yes Suicidal Plan?: No Has patient had any suicidal plan within the past 6 months prior to admission? : No Access to Means: Yes Specify Access to Suicidal Means: access to pills What has been your use of drugs/alcohol within the last 12 months?: none Previous Attempts/Gestures: No How many times?: 0 Other Self Harm Risks: none noted Triggers for Past Attempts: None known Intentional Self Injurious Behavior: None Family Suicide History: No Recent stressful life event(s): Turmoil (Comment) Persecutory voices/beliefs?: No Depression: Yes Depression Symptoms: Despondent, Insomnia, Tearfulness, Isolating, Fatigue, Guilt, Loss of interest in usual pleasures, Feeling worthless/self pity Substance abuse history and/or treatment for substance abuse?: No Suicide prevention information given to non-admitted patients: Yes  Risk to Others within the past 6 months Homicidal Ideation: No Does patient have any lifetime risk of violence toward others beyond the six months prior to admission? : No Thoughts of Harm to Others: No Current Homicidal Intent: No Current Homicidal Plan: No Access to Homicidal Means: No Identified Victim: none History of harm to others?: No Assessment  of Violence: None Noted Violent Behavior Description: none noted Does patient have access to weapons?: No Criminal Charges Pending?: No Does patient have a court date: No Is patient on probation?: No  Psychosis Hallucinations: None noted Delusions: None noted  Mental Status Report Appearance/Hygiene: In hospital gown Eye Contact: Good Motor Activity: Freedom of movement Speech: Logical/coherent Level of Consciousness: Alert Mood: Depressed Affect: Depressed Anxiety Level: Moderate Thought Processes: Coherent, Relevant Judgement:  Partial Orientation: Person, Place, Time, Situation, Appropriate for developmental age Obsessive Compulsive Thoughts/Behaviors: Moderate  Cognitive Functioning Concentration: Decreased Memory: Recent Intact, Remote Intact IQ: Average Insight: Fair Impulse Control: Poor Appetite: Fair Weight Loss: 0 Weight Gain: 0 Sleep: Decreased Total Hours of Sleep: 4 Vegetative Symptoms: None  ADLScreening [facility] Assessment Services) Patient's cognitive ability adequate to safely complete daily activities?: Yes Patient able to express need for assistance with ADLs?: Yes Independently performs ADLs?: Yes (appropriate for developmental age)  Prior Inpatient Therapy Prior Inpatient Therapy: Yes Prior Therapy Dates: 1980 Prior Therapy Facilty/Provider(s): [facility] Reason for Treatment: depression  Prior Outpatient Therapy Prior Outpatient Therapy: Yes Prior Therapy Dates: current Prior Therapy Facilty/Provider(s): [facility] outpatient Dr. Einar Grad Reason for Treatment: depression Does patient have an ACCT team?: No Does patient have Intensive In-House Services?  : No Does patient have [facility] services? : No Does patient have P4CC services?: No  ADL Screening (condition at time of admission) Patient's cognitive ability adequate to safely complete daily activities?: Yes Is the patient deaf or have difficulty hearing?: No Does the patient have difficulty seeing, even when wearing glasses/contacts?: No Does the patient have difficulty concentrating, remembering, or making decisions?: No Patient able to express need for assistance with ADLs?: Yes Does the patient have difficulty dressing or bathing?: No Independently performs ADLs?: Yes (appropriate for developmental age) Does the patient have difficulty walking or climbing stairs?: No Weakness of Legs: None Weakness of Arms/Hands: None       Abuse/Neglect Assessment (Assessment to be complete while patient is alone) Physical Abuse:  Denies Verbal Abuse: Denies Sexual Abuse: Denies Exploitation of patient/patient's resources: Denies Self-Neglect: Denies Values / Beliefs Cultural Requests During Hospitalization: None Spiritual Requests During Hospitalization: None   Advance Directives (For Healthcare) Does patient have an advance directive?: No Would patient like information on creating an advanced directive?: No - patient declined information    Additional Information 1:1 In Past 12 Months?: No CIRT Risk: No Elopement Risk: Yes Does patient have medical clearance?: Yes     Disposition:  Disposition Initial Assessment Completed for this Encounter: Yes Disposition of Patient: Other dispositions (TBD)  Keelon Zurn K Mikaila Grunert 03/16/2016 5:28 PM

## 2016-03-16 NOTE — ED Notes (Addendum)
Pt dressed out and belonings labeled and given to daughter at bedside

## 2016-03-16 NOTE — ED Notes (Signed)
Pt states" I would rather die than do all of this." Pt states "I was trying to save the world".

## 2016-03-16 NOTE — ED Notes (Signed)
Pt IVC at this time

## 2016-03-16 NOTE — ED Provider Notes (Signed)
[facility] Emergency Department Provider Note   ____________________________________________   First MD Initiated Contact with Patient 03/16/16 1555     (approximate)  I have reviewed the triage vital signs and the nursing notes.   HISTORY  Chief Complaint Drug Overdose    HPI Vicki Morales is a 75 y.o. female with history of anxiety, depression, COPD, pulmonary hypertension who presents for evaluation after intentional overdose on prescribed medications with suicidal intent, gradual onset yesterday, constant, severe, no modifying factors. Patient reports that she took an unknown amount of tramadol, hydroxyzine and Vistaril. She reports that she took these medications "to try and save the world "and because "I just wanted to move on". She denies any chest pain or difficulty breathing, no nausea vomiting diarrhea fevers or chills. She reports that her daughter was the one who called EMS.  The following pill bottles are empty: Fiorcet #30 tabs, filled on 01/30/16 Ultram 50 mg tablets, #60, filled on 03/01/2016 Hydroxyzine 25 mg tablets, #60 filled on 02/20/2016     Past Medical History:  Diagnosis Date  . Anxiety   . COPD (chronic obstructive pulmonary disease) ([place])   . Depression   . GERD (gastroesophageal reflux disease)   . Hypertension     Patient Active Problem List   Diagnosis Date Noted  . Suicide attempt 03/16/2016  . Overdose 03/16/2016  . Schizophrenia ([place]) 03/16/2016  . Pulmonary hypertension 11/11/2015  . Anxiety 09/22/2015  . Depression 09/22/2015  . Osteoarthritis 09/10/2015  . Rosacea 07/03/2015  . Breast pain 11/07/2014  . Callus of foot 11/07/2014  . Weak pulse 11/07/2014  . CN (constipation) 11/07/2014  . Dermatitis, eczematoid 11/07/2014  . Diverticulosis of colon 11/07/2014  . Dizziness 11/07/2014  . Can't get food down 11/07/2014  . Accumulation of fluid in tissues 11/07/2014  . 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 Fatigue 11/07/2014  . Farts 11/07/2014  . FOM (frequency of micturition) 11/07/2014  . Tension type headache 11/07/2014  . LBP (low back pain) 11/07/2014  . Episodic mood disorder ([place]) 11/07/2014  . Extreme obesity ([place]) 11/07/2014  . Muscle ache 11/07/2014  . Disturbance of skin sensation 11/07/2014  . Awareness of heartbeats 11/07/2014  . Jerking 11/07/2014  . Body tinea 11/07/2014  . Essential (primary) hypertension 10/10/2014  . H/O: obesity 04/01/2014  . COPD, moderate ([place]) 04/01/2014  . Moderate COPD (chronic obstructive pulmonary disease) ([place]) 04/01/2014  . CAFL (chronic airflow limitation) ([place]) 01/25/2009  . Malaise and fatigue 11/26/2008  . Aphasia 09/26/2008  . 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 GERD (gastroesophageal reflux disease) 06/07/1998  . HLD (hyperlipidemia) 06/07/1998  . HTN (hypertension) 06/07/1998  . OP (osteoporosis) 06/07/1998  . H/O total hysterectomy 03/08/1987  . Schizophrenia, in remission ([place]) 06/07/1978    Past Surgical History:  Procedure Laterality Date  . ABDOMINAL HYSTERECTOMY  1988  . APPENDECTOMY  1988  . ESOPHAGOGASTRODUODENOSCOPY N/A 11/11/2014   Procedure: ESOPHAGOGASTRODUODENOSCOPY (EGD);  Surgeon: Josefine Class, MD;  Location: [facility] ENDOSCOPY;  Service: Endoscopy;  Laterality: N/A;  . TONSILLECTOMY AND ADENOIDECTOMY    . TRIGGER FINGER RELEASE  2004    Prior to Admission medications   Medication Sig Start Date End Date Taking? Authorizing Provider  acidophilus (RISAQUAD) CAPS capsule Take 1 capsule by mouth daily.    Historical Provider, MD  albuterol (PROVENTIL) (2.5 MG/3ML) 0.083% nebulizer solution Take 2.5 mg by nebulization 2 (two) times daily as needed for wheezing or shortness of breath.  Historical Provider, MD  amLODipine (NORVASC) 10 MG tablet TAKE 1 TABLET(10 MG) BY MOUTH DAILY 02/11/16   Birdie Sons, MD  aspirin EC 325 MG tablet Take 1 tablet (325 mg  total) by mouth daily. 09/23/15   Demetrios Loll, MD  benazepril (LOTENSIN) 5 MG tablet Take 1 tablet (5 mg total) by mouth daily. 11/14/15   Birdie Sons, MD  butalbital-acetaminophen-caffeine (FIORICET, ESGIC) [phone] MG tablet TAKE 1 TABLET BY MOUTH EVERY 4 HOURS AS NEEDED FOR HEADACHE 01/30/16   Birdie Sons, MD  Calcium Carbonate-Vitamin D (CALCIUM 600+D) 600-400 MG-UNIT tablet Take 1 tablet by mouth 2 (two) times daily.    Historical Provider, MD  Cranberry 250 MG CAPS Take 250 mg by mouth daily.     Historical Provider, MD  fluticasone (FLONASE) 50 MCG/ACT nasal spray Place 1 spray into both nostrils daily.    Historical Provider, MD  Fluticasone-Salmeterol (ADVAIR) 250-50 MCG/DOSE AEPB Inhale 1 puff into the lungs 2 (two) times daily.    Historical Provider, MD  glucosamine-chondroitin 500-400 MG tablet Take 1 tablet by mouth 2 (two) times daily.    Historical Provider, MD  hydrOXYzine (ATARAX/VISTARIL) 25 MG tablet Take 1 tablet (25 mg total) by mouth every 8 (eight) hours as needed for anxiety. 03/10/16   Birdie Sons, MD  loratadine (CLARITIN) 10 MG tablet Take 10 mg by mouth daily.    Historical Provider, MD  Magnesium Oxide 250 MG TABS Take 500 mg by mouth at bedtime.    Historical Provider, MD  Misc Natural Products (OSTEO BI-FLEX JOINT SHIELD PO) Take 1 tablet by mouth 2 (two) times daily.    Historical Provider, MD  montelukast (SINGULAIR) 10 MG tablet Take 10 mg by mouth daily.     Historical Provider, MD  Multiple Vitamin (MULTIVITAMIN WITH MINERALS) TABS tablet Take 1 tablet by mouth daily.    Historical Provider, MD  omeprazole (PRILOSEC) 20 MG capsule Take 20 mg by mouth daily.    Historical Provider, MD  oxybutynin (DITROPAN-XL) 10 MG 24 hr tablet TAKE 1 TABLET BY MOUTH AT BEDTIME 02/29/16   Birdie Sons, MD  ranitidine (ZANTAC) 150 MG tablet Take 150 mg by mouth at bedtime.    Historical Provider, MD  sertraline (ZOLOFT) 100 MG tablet Take 1 tablet (100 mg total) by mouth  at bedtime. 02/05/16   Himabindu Ravi, MD  sucralfate (CARAFATE) 1 g tablet Take 1 tablet (1 g total) by mouth 2 (two) times daily. 12/24/15   Loney Hering, MD  theophylline (UNIPHYL) 400 MG 24 hr tablet Take 400 mg by mouth daily.    Historical Provider, MD  tiZANidine (ZANAFLEX) 4 MG tablet Take 1 tablet (4 mg total) by mouth daily. 02/16/16   Birdie Sons, MD  traMADol (ULTRAM) 50 MG tablet TAKE 1 TABLET BY MOUTH EVERY 8 HOURS AS NEEDED 02/29/16   Birdie Sons, MD  traZODone (DESYREL) 50 MG tablet Take 2 tablets (100 mg total) by mouth at bedtime. 02/05/16   Himabindu Ravi, MD  trifluoperazine (STELAZINE) 5 MG tablet Take 1 tablet (5 mg total) by mouth at bedtime. 03/16/16   Elvin So, MD    Allergies Arthrotec [diclofenac-misoprostol]; Codeine; Diclofenac; Hydrochlorothiazide; Morphine; Penicillins; Sulfa antibiotics; Tiotropium bromide monohydrate; Tylenol [acetaminophen]; and Pantoprazole  Family History  Problem Relation Age of Onset  . Breast cancer Sister   . Stroke Father   . Emphysema Father   . Anxiety disorder Father   . Depression Father   .  Anxiety disorder Brother     Social History Social History  Substance Use Topics  . Smoking status: Former Smoker    Years: 30.00  . Smokeless tobacco: Never Used     Comment: quit in 1990's  . Alcohol use No    Review of Systems Constitutional: No fever/chills Eyes: No visual changes. ENT: No sore throat. Cardiovascular: Denies chest pain. Respiratory: Denies shortness of breath. Gastrointestinal: No abdominal pain.  No nausea, no vomiting.  No diarrhea.  No constipation. Genitourinary: Negative for dysuria. Musculoskeletal: Negative for back pain. Skin: Negative for rash. Neurological: Negative for headaches, focal weakness or numbness.  10-point ROS otherwise negative.  ____________________________________________   PHYSICAL EXAM:  VITAL SIGNS: ED Triage Vitals  Enc Vitals Group     BP 03/16/16  1549 (!) 181/74     Pulse Rate 03/16/16 1549 73     Resp 03/16/16 1549 16     Temp 03/16/16 1549 97.4 F (36.3 C)     Temp Source 03/16/16 1549 Oral     SpO2 03/16/16 1549 99 %     Weight --      Height --      Head Circumference --      Peak Flow --      Pain Score 03/16/16 1552 0     Pain Loc --      Pain Edu? --      Excl. in [place]? --     Constitutional: Alert and oriented. Well appearing and in no acute distress. Eyes: Conjunctivae are normal. PERRL. EOMI. Head: Atraumatic. Nose: No congestion/rhinnorhea. Mouth/Throat: Mucous membranes are moist.  Oropharynx non-erythematous. Neck: No stridor.   Cardiovascular: Normal rate, regular rhythm. Grossly normal heart sounds.  Good peripheral circulation. Respiratory: Normal respiratory effort.  No retractions. Lungs CTAB. Gastrointestinal: Soft and nontender. No distention. No CVA tenderness. Genitourinary: deferred Musculoskeletal: No lower extremity tenderness nor edema.  No joint effusions. Neurologic:  Normal speech and language. No gross focal neurologic deficits are appreciated. 5 out of 5 strength in bilateral upper and lower extremities, sensation intact to light touch throughout. Skin:  Skin is warm, dry and intact. No rash noted. Psychiatric: Mood and affect are normal. Speech and behavior are normal.  ____________________________________________   LABS (all labs ordered are listed, but only abnormal results are displayed)  Labs Reviewed  CBC WITH DIFFERENTIAL/PLATELET - Abnormal; Notable for the following:       Result Value   RBC 5.31 (*)    Hemoglobin 16.2 (*)    HCT 49.3 (*)    All other components within normal limits  COMPREHENSIVE METABOLIC PANEL - Abnormal; Notable for the following:    Sodium 132 (*)    Chloride 98 (*)    Glucose, Bld 138 (*)    GFR calc non Af Amer 59 (*)    All other components within normal limits  ACETAMINOPHEN LEVEL - Abnormal; Notable for the following:    Acetaminophen  (Tylenol), Serum 48 (*)    All other components within normal limits  URINE DRUG SCREEN, QUALITATIVE ([facility] ONLY) - Abnormal; Notable for the following:    Barbiturates, Ur Screen POSITIVE (*)    All other components within normal limits  URINALYSIS COMPLETEWITH MICROSCOPIC ([facility] ONLY) - Abnormal; Notable for the following:    Color, Urine YELLOW (*)    APPearance HAZY (*)    Ketones, ur TRACE (*)    All other components within normal limits  ETHANOL  SALICYLATE LEVEL  ACETAMINOPHEN LEVEL  COMPREHENSIVE METABOLIC PANEL   ____________________________________________  EKG  ED ECG REPORT I, Joanne Gavel, the attending physician, personally viewed and interpreted this ECG.   Date: 03/16/2016  EKG Time: 15:52  Rate: 74  Rhythm: normal sinus rhythm  Axis: normal  Intervals:LAFB  ST&T Change: No acute ST elevation or acute ST depression.  ____________________________________________  RADIOLOGY  CXR IMPRESSION:  No acute cardiopulmonary abnormality seen      ____________________________________________   PROCEDURES  Procedure(s) performed: None  Procedures  Critical Care performed: No  ____________________________________________   INITIAL IMPRESSION / ASSESSMENT AND PLAN / ED COURSE  Pertinent labs & imaging results that were available during my care of the patient were reviewed by me and considered in my medical decision making (see chart for details).  Vicki Morales is a 75 y.o. female with history of anxiety, depression, COPD, pulmonary hypertension who presents for evaluation after intentional overdose on prescribed medications with suicidal intent. On exam, she is well-appearing and in no acute distress, vital signs stable and she is afebrile. She is asymptomatic, EKG reassuring. Labs reviewed. CBC notable for increase in hemoglobin at 16.2, likely hemoconcentration we'll give IV fluids. CMP generally unremarkable, reassuring LFTs. Urinalysis not  consistent with infection. Alcohol level, salicylate undetectable. Urine drug screen positive for barbiturates. Tylenol level is 48. I discussed the case with poison control center, recommendation is for a 4 hour Tylenol level as well as repeat CMP at that time. If acetaminophen level is detectable and/or LFTs are increased, the patient will begin receiving NAC per their recommendations. If acetaminophen level undetectable and she continues to have normal LFTs, very unlikely to experience any acetaminophen toxicity. I placed involuntary commitment, will consult behavioral health as well as psychiatry. Care transferred to Dr. Kerman Passey at 5:15 PM.  Clinical Course     ____________________________________________   FINAL CLINICAL IMPRESSION(S) / ED DIAGNOSES  Final diagnoses:  Suicidal ideation  Intentional drug overdose, initial encounter [facility])      NEW MEDICATIONS STARTED DURING THIS VISIT:  New Prescriptions   No medications on file     Note:  This document was prepared using Dragon voice recognition software and may include unintentional dictation errors.    Joanne Gavel, MD 03/16/16 1901

## 2016-03-16 NOTE — BHH Counselor (Signed)
Referral information for Geriatric Placement have been faxed to:  [facility] [phone])  [address]. Lurena Joiner [phone] ext [phone])  [facility] Hoes [phone])  [facility] Cella [phone])  [address] [phone])  [facility] [phone])  [facility] Nearing [phone])

## 2016-03-16 NOTE — ED Notes (Signed)
Family at bedside. 

## 2016-03-16 NOTE — Progress Notes (Signed)
Patient ID: Vicki Morales, female   DOB: [birth year], 75 y.o.   MRN: [id number]   [facility] MD/PA/NP OP Progress Note  03/16/2016 2:27 PM CHUMY MOMENT  MRN:  [id number]  Subjective:  Patient's daughter was seen today. Patient reports that patient had come down with a cold and was not doing well and she wanted to discuss patient's medications with this clinician. She reports that since starting the Abilify to patient has not been doing well. However daughter also reports that patient has not been taking her medications over the last few days. Patient also states that she things the Stelazine work better for patient than the Abilify. She would like for this clinician to start patient back on Stelazine and stop the Abilify.  Chief Complaint: Depressed mood since stopping the Stelazine.  Visit Diagnosis:     ICD-9-CM ICD-10-CM   1. Schizophrenia, unspecified type ([place]) 295.90 F20.9     Past Medical History:  Past Medical History:  Diagnosis Date  . Anxiety   . COPD (chronic obstructive pulmonary disease) ([place])   . Depression   . GERD (gastroesophageal reflux disease)   . Hypertension     Past Surgical History:  Procedure Laterality Date  . ABDOMINAL HYSTERECTOMY  1988  . APPENDECTOMY  1988  . ESOPHAGOGASTRODUODENOSCOPY N/A 11/11/2014   Procedure: ESOPHAGOGASTRODUODENOSCOPY (EGD);  Surgeon: Josefine Class, MD;  Location: [facility] ENDOSCOPY;  Service: Endoscopy;  Laterality: N/A;  . TONSILLECTOMY AND ADENOIDECTOMY    . TRIGGER FINGER RELEASE  2004   Family History:  Family History  Problem Relation Age of Onset  . Breast cancer Sister   . Stroke Father   . Emphysema Father   . Anxiety disorder Father   . Depression Father   . Anxiety disorder Brother    Social History:  Social History   Social History  . Marital status: Widowed    Spouse name: N/A  . Number of children: N/A  . Years of education: N/A   Social History Main Topics  . Smoking status: Former Smoker    Years:  30.00  . Smokeless tobacco: Never Used     Comment: quit in 1990's  . Alcohol use No  . Drug use: No  . Sexual activity: Not Currently   Other Topics Concern  . Not on file   Social History Narrative  . No narrative on file   Additional History:   Assessment:   Musculoskeletal: Strength & Muscle Tone: within normal limits Gait & Station: Slow and walks with a cane Patient leans: N/A  Psychiatric Specialty Exam: Medication Refill     Review of Systems  Psychiatric/Behavioral: Negative for depression, hallucinations, memory loss, substance abuse and suicidal ideas. The patient is not nervous/anxious and does not have insomnia.   All other systems reviewed and are negative.   There were no vitals taken for this visit.There is no height or weight on file to calculate BMI.  General Appearance: Well Groomed  Eye Contact:  Good  Speech:  Normal Rate  Volume:  Normal  Mood:  Somewhat depressed recently   Affect:  Congruent Bright, smiling   Thought Process:  Linear and Logical  Orientation:  Full (Time, Place, and Person)  Thought Content:  Negative  Suicidal Thoughts:  No  Homicidal Thoughts:  No  Memory:  Immediate;   Good Recent;   Good Remote;   Good  Judgement:  Good  Insight:  Good  Psychomotor Activity:  Normal  Concentration:  Good  Recall:  Good  Fund of Knowledge: Good  Language: Good  Akathisia:  Negative  Handed:    AIMS (if indicated):  Mild cogwheeling noted on right arm   Assets:  Communication Skills Desire for Improvement  ADL's:  Intact  Cognition: WNL  Sleep:  Good states 12 midnight to 8am last night   Is the patient at risk to self?  No. Has the patient been a risk to self in the past 6 months?  No. Has the patient been a risk to self within the distant past?  Yes.  OD in 1979 Is the patient a risk to others?  No. Has the patient been a risk to others in the past 6 months?  No. Has the patient been a risk to others within the distant  past?  No.  Current Medications: Current Outpatient Prescriptions  Medication Sig Dispense Refill  . acidophilus (RISAQUAD) CAPS capsule Take 1 capsule by mouth daily.    [name] Kitchen albuterol (PROVENTIL) (2.5 MG/3ML) 0.083% nebulizer solution Take 2.5 mg by nebulization 2 (two) times daily as needed for wheezing or shortness of breath.    [name] Kitchen amLODipine (NORVASC) 10 MG tablet TAKE 1 TABLET(10 MG) BY MOUTH DAILY 30 tablet 12  . aspirin EC 325 MG tablet Take 1 tablet (325 mg total) by mouth daily. 30 tablet 2  . benazepril (LOTENSIN) 5 MG tablet Take 1 tablet (5 mg total) by mouth daily. 30 tablet 5  . butalbital-acetaminophen-caffeine (FIORICET, ESGIC) 50-325-40 MG tablet TAKE 1 TABLET BY MOUTH EVERY 4 HOURS AS NEEDED FOR HEADACHE 30 tablet 5  . Calcium Carbonate-Vitamin D (CALCIUM 600+D) 600-400 MG-UNIT tablet Take 1 tablet by mouth 2 (two) times daily.    . Cranberry 250 MG CAPS Take 250 mg by mouth daily.     . fluticasone (FLONASE) 50 MCG/ACT nasal spray Place 1 spray into both nostrils daily.    . Fluticasone-Salmeterol (ADVAIR) 250-50 MCG/DOSE AEPB Inhale 1 puff into the lungs 2 (two) times daily.    [name] Kitchen glucosamine-chondroitin 500-400 MG tablet Take 1 tablet by mouth 2 (two) times daily.    . hydrOXYzine (ATARAX/VISTARIL) 25 MG tablet Take 1 tablet (25 mg total) by mouth every 8 (eight) hours as needed for anxiety. 60 tablet 5  . loratadine (CLARITIN) 10 MG tablet Take 10 mg by mouth daily.    . Magnesium Oxide 250 MG TABS Take 500 mg by mouth at bedtime.    . Misc Natural Products (OSTEO BI-FLEX JOINT SHIELD PO) Take 1 tablet by mouth 2 (two) times daily.    . montelukast (SINGULAIR) 10 MG tablet Take 10 mg by mouth daily.     . Multiple Vitamin (MULTIVITAMIN WITH MINERALS) TABS tablet Take 1 tablet by mouth daily.    [name] Kitchen omeprazole (PRILOSEC) 20 MG capsule Take 20 mg by mouth daily.    [name] Kitchen oxybutynin (DITROPAN-XL) 10 MG 24 hr tablet TAKE 1 TABLET BY MOUTH AT BEDTIME 30 tablet 5  . ranitidine (ZANTAC)  150 MG tablet Take 150 mg by mouth at bedtime.    . sertraline (ZOLOFT) 100 MG tablet Take 1 tablet (100 mg total) by mouth at bedtime. 30 tablet 2  . sucralfate (CARAFATE) 1 g tablet Take 1 tablet (1 g total) by mouth 2 (two) times daily. 20 tablet 0  . theophylline (UNIPHYL) 400 MG 24 hr tablet Take 400 mg by mouth daily.    [name] Kitchen tiZANidine (ZANAFLEX) 4 MG tablet Take 1 tablet (4 mg total) by mouth daily. 30 tablet 4  .  traMADol (ULTRAM) 50 MG tablet TAKE 1 TABLET BY MOUTH EVERY 8 HOURS AS NEEDED 60 tablet 5  . traZODone (DESYREL) 50 MG tablet Take 2 tablets (100 mg total) by mouth at bedtime. 60 tablet 2  . trifluoperazine (STELAZINE) 5 MG tablet Take 1 tablet (5 mg total) by mouth at bedtime. 30 tablet 1   No current facility-administered medications for this visit.     Medical Decision Making:  Established Problem, Stable/Improving (1) and Review of Medication Regimen & Side Effects (2)  Treatment Plan Summary:Medication management and Plan Plan Schizophrenia-  Continue sertraline at 100 mg daily, her trazodone at 100 mg at bedtime.  Discontinue the Abilify  Start Stelazine at 5 mg once daily at bedtime  She will follow-up in 1 months or call before if needed  Elaijah Munoz 03/16/2016, 2:27 PM

## 2016-03-16 NOTE — ED Notes (Signed)
TTS at bedside. 

## 2016-03-16 NOTE — ED Notes (Signed)
PT IVC PENDING CONSULT  

## 2016-03-16 NOTE — ED Notes (Signed)
Pt BP dropped into 80s. Rechecked and still low. MD notified. Liter of NS hung with pressure bag. Bedtime meds being held at this time. Pt very drowsy, but arousable. Slurring words at present.

## 2016-03-16 NOTE — ED Provider Notes (Signed)
-----------------------------------------   9:49 PM on 03/16/2016 -----------------------------------------  I discussed the patient with poison control. With normal LFTs and acetaminophen level decreasing they do not recommend using N-acetylcysteine. We'll continue to monitor the patient. Her blood pressure did briefly dipped to [id number] systolic, placed on IV fluids. Patient continues to [facility] well. The blood pressure dropped after receiving multiple blood pressure medications about an hour prior. We will continue to monitor the patient until midnight at which time his lungs and vitals have improved and the patient appears well she'll be medically cleared for psychiatric evaluation.   Harvest Dark, MD 03/16/16 2150

## 2016-03-16 NOTE — ED Triage Notes (Signed)
Pt to ED via EMS from home for intentional overdose. Pt took tamadol, hydroxine, and vistaril. Pt states " I took everything I could get my hands on". Pt started taking meds yesterday. Pt A&Ox4. Pt denies any HI. NSR on monitor

## 2016-03-16 NOTE — Consult Note (Signed)
[facility] Consult   Reason for Consult:  Consult for 75 year old woman with a history of schizophrenia who came into the hospital after taking an overdose Referring Physician:  Edd Fabian Patient Identification: Vicki Morales MRN:  [id number] Principal Diagnosis: Schizophrenia [facility]) Diagnosis:   Patient Active Problem List   Diagnosis Date Noted  . Suicide attempt [T14.91XA] 03/16/2016  . Overdose [T50.901A] 03/16/2016  . Schizophrenia ([place]) [F20.9] 03/16/2016  . Pulmonary hypertension [I27.20] 11/11/2015  . Anxiety [F41.9] 09/22/2015  . Depression [F32.9] 09/22/2015  . Osteoarthritis [M19.90] 09/10/2015  . Rosacea [L71.9] 07/03/2015  . Breast pain [N64.4] 11/07/2014  . Callus of foot [L84] 11/07/2014  . Weak pulse [R09.89] 11/07/2014  . CN (constipation) [K59.00] 11/07/2014  . Dermatitis, eczematoid [L30.9] 11/07/2014  . Diverticulosis of colon [K57.30] 11/07/2014  . Dizziness [R42] 11/07/2014  . Can't get food down [IMO0001] 11/07/2014  . Accumulation of fluid in tissues [R60.9] 11/07/2014  . 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 Fatigue [R53.83] 11/07/2014  . Farts [R14.3] 11/07/2014  . FOM (frequency of micturition) [R35.0] 11/07/2014  . Tension type headache [G44.209] 11/07/2014  . LBP (low back pain) [M54.5] 11/07/2014  . Episodic mood disorder ([place]) [F39] 11/07/2014  . Extreme obesity ([place]) [E66.01] 11/07/2014  . Muscle ache [M79.1] 11/07/2014  . Disturbance of skin sensation [R20.9] 11/07/2014  . Awareness of heartbeats [R00.2] 11/07/2014  . Jerking [R25.3] 11/07/2014  . Body tinea [B35.4] 11/07/2014  . Essential (primary) hypertension [I10] 10/10/2014  . H/O: obesity [Z86.39] 04/01/2014  . COPD, moderate ([place]) [J44.9] 04/01/2014  . Moderate COPD (chronic obstructive pulmonary disease) ([place]) [J44.9] 04/01/2014  . CAFL (chronic airflow limitation) ([place]) [J44.9] 01/25/2009  . Malaise and fatigue [R53.81, R53.83] 11/26/2008   . Aphasia [R47.01] 09/26/2008  . 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 GERD (gastroesophageal reflux disease) [K21.9] 06/07/1998  . HLD (hyperlipidemia) [E78.5] 06/07/1998  . HTN (hypertension) [I10] 06/07/1998  . OP (osteoporosis) [M81.0] 06/07/1998  . H/O total hysterectomy [Z90.710] 03/08/1987  . Schizophrenia, in remission ([place]) [F20.9] 06/07/1978    Total Time spent with patient: 1 hour  Subjective:   Vicki Morales is a 75 y.o. female patient admitted with "I took a bunch of pills".  HPI:  Patient interviewed. Chart reviewed. Labs and vitals reviewed. Case discussed with emergency room physician. 75 year old woman with a history of schizophrenia reports that she took a large amount of medication today. She's not clear on what all she took but it probably included her muscle relaxers. She said that after she did her speech became slurred which is how her daughter knew that she had done something. Patient says that she wanted to kill her self. She also makes bizarre statements about how she wanted to "save the world". Patient says her mood has been bad for 40 or 50 years. She is not a very good historian about recent details. Doesn't sleep very well. Feels ill and irritable a lot of the time. Sounds like she has a lot of chronic muscle aches and pains. Denies that she's been having any hallucinations recently. Compliant with medicine. Fairly recently her Stelazine was changed to Abilify which may or may not have contributed to making her feel worse.  Social history: Lives with her adult daughter who watches out for her.  Medical history: Multiple medical problems including COPD, hypertension, gastric reflux  Substance abuse history: No history of alcohol or drug abuse.  Past Psychiatric History: Patient has a long-standing history of schizophrenia  or schizoaffective disorder with several prior admissions to hospitals. Has made at least one prior suicide  attempt in the past. Has been on multiple antipsychotics including most recently Stelazine and Abilify. She says she used to hear voices but doesn't have that happening anymore. Currently sees Dr. Einar Grad as an outpatient  Risk to Self: Suicidal Ideation: No Suicidal Intent: No Is patient at risk for suicide?: Yes Suicidal Plan?: No Access to Means: Yes Specify Access to Suicidal Means: access to pills What has been your use of drugs/alcohol within the last 12 months?: none How many times?: 0 Other Self Harm Risks: none noted Triggers for Past Attempts: None known Intentional Self Injurious Behavior: None Risk to Others: Homicidal Ideation: No Thoughts of Harm to Others: No Current Homicidal Intent: No Current Homicidal Plan: No Access to Homicidal Means: No Identified Victim: none History of harm to others?: No Assessment of Violence: None Noted Violent Behavior Description: none noted Does patient have access to weapons?: No Criminal Charges Pending?: No Does patient have a court date: No Prior Inpatient Therapy: Prior Inpatient Therapy: Yes Prior Therapy Dates: 1980 Prior Therapy Facilty/Provider(s): [facility] Reason for Treatment: depression Prior Outpatient Therapy: Prior Outpatient Therapy: Yes Prior Therapy Dates: current Prior Therapy Facilty/Provider(s): [facility] outpatient Dr. Einar Grad Reason for Treatment: depression Does patient have an ACCT team?: No Does patient have Intensive In-House Services?  : No Does patient have [facility] services? : No Does patient have P4CC services?: No  Past Medical History:  Past Medical History:  Diagnosis Date  . Anxiety   . COPD (chronic obstructive pulmonary disease) ([place])   . Depression   . GERD (gastroesophageal reflux disease)   . Hypertension     Past Surgical History:  Procedure Laterality Date  . ABDOMINAL HYSTERECTOMY  1988  . APPENDECTOMY  1988  . ESOPHAGOGASTRODUODENOSCOPY N/A 11/11/2014   Procedure:  ESOPHAGOGASTRODUODENOSCOPY (EGD);  Surgeon: Josefine Class, MD;  Location: [facility] ENDOSCOPY;  Service: Endoscopy;  Laterality: N/A;  . TONSILLECTOMY AND ADENOIDECTOMY    . TRIGGER FINGER RELEASE  2004   Family History:  Family History  Problem Relation Age of Onset  . Breast cancer Sister   . Stroke Father   . Emphysema Father   . Anxiety disorder Father   . Depression Father   . Anxiety disorder Brother    Family Psychiatric  History: Think she had a cousin with mental health problems Social History:  History  Alcohol Use No     History  Drug Use No    Social History   Social History  . Marital status: Widowed    Spouse name: N/A  . Number of children: N/A  . Years of education: N/A   Social History Main Topics  . Smoking status: Former Smoker    Years: 30.00  . Smokeless tobacco: Never Used     Comment: quit in 1990's  . Alcohol use No  . Drug use: No  . Sexual activity: Not Currently   Other Topics Concern  . None   Social History Narrative  . None   Additional Social History:    Allergies:   Allergies  Allergen Reactions  . Arthrotec [Diclofenac-Misoprostol] Hives  . Codeine Other (See Comments)    Reaction:  Headache   . Diclofenac     Pt unsure allergy  . Hydrochlorothiazide Other (See Comments)    Reaction:  Weakness   . Morphine Hives  . Penicillins Hives and Other (See Comments)    Has patient had a PCN  reaction causing immediate rash, facial/tongue/throat swelling, SOB or lightheadedness with hypotension: No Has patient had a PCN reaction causing severe rash involving mucus membranes or skin necrosis: No Has patient had a PCN reaction that required hospitalization No Has patient had a PCN reaction occurring within the last 10 years: No If all of the above answers are "NO", then may proceed with Cephalosporin use.  . Sulfa Antibiotics Hives  . Tiotropium Bromide Monohydrate Other (See Comments)    Reaction:  Blurry vision   . Tylenol  [Acetaminophen]   . Pantoprazole Rash    Labs:  Results for orders placed or performed during the hospital encounter of 03/16/16 (from the past 48 hour(s))  CBC with Differential     Status: Abnormal   Collection Time: 03/16/16  3:56 PM  Result Value Ref Range   WBC 3.6 3.6 - 11.0 K/uL   RBC 5.31 (H) 3.80 - 5.20 MIL/uL   Hemoglobin 16.2 (H) 12.0 - 16.0 g/dL   HCT 49.3 (H) 35.0 - 47.0 %   MCV 92.8 80.0 - 100.0 fL   MCH 30.4 26.0 - 34.0 pg   MCHC 32.8 32.0 - 36.0 g/dL   RDW 14.1 11.5 - 14.5 %   Platelets 169 150 - 440 K/uL   Neutrophils Relative % 62 %   Neutro Abs 2.2 1.4 - 6.5 K/uL   Lymphocytes Relative 33 %   Lymphs Abs 1.2 1.0 - 3.6 K/uL   Monocytes Relative 5 %   Monocytes Absolute 0.2 0.2 - 0.9 K/uL   Eosinophils Relative 0 %   Eosinophils Absolute 0.0 0 - 0.7 K/uL   Basophils Relative 0 %   Basophils Absolute 0.0 0 - 0.1 K/uL  Comprehensive metabolic panel     Status: Abnormal   Collection Time: 03/16/16  3:56 PM  Result Value Ref Range   Sodium 132 (L) 135 - 145 mmol/L   Potassium 4.0 3.5 - 5.1 mmol/L   Chloride 98 (L) 101 - 111 mmol/L   CO2 25 22 - 32 mmol/L   Glucose, Bld 138 (H) 65 - 99 mg/dL   BUN 15 6 - 20 mg/dL   Creatinine, Ser 0.92 0.44 - 1.00 mg/dL   Calcium 9.5 8.9 - 10.3 mg/dL   Total Protein 7.0 6.5 - 8.1 g/dL   Albumin 4.1 3.5 - 5.0 g/dL   AST 23 15 - 41 U/L   ALT 14 14 - 54 U/L   Alkaline Phosphatase 82 38 - 126 U/L   Total Bilirubin 0.6 0.3 - 1.2 mg/dL   GFR calc non Af Amer 59 (L) >60 mL/min   GFR calc Af Amer >60 >60 mL/min    Comment: (NOTE) The eGFR has been calculated using the CKD EPI equation. This calculation has not been validated in all clinical situations. eGFR's persistently <60 mL/min signify possible Chronic Kidney Disease.    Anion gap 9 5 - 15  Ethanol     Status: None   Collection Time: 03/16/16  3:56 PM  Result Value Ref Range   Alcohol, Ethyl (B) <5 <5 mg/dL    Comment:        LOWEST DETECTABLE LIMIT FOR SERUM  ALCOHOL IS 5 mg/dL FOR MEDICAL PURPOSES ONLY   Acetaminophen level     Status: Abnormal   Collection Time: 03/16/16  3:56 PM  Result Value Ref Range   Acetaminophen (Tylenol), Serum 48 (H) 10 - 30 ug/mL    Comment:        THERAPEUTIC CONCENTRATIONS VARY  SIGNIFICANTLY. A RANGE OF 10-30 ug/mL MAY BE AN EFFECTIVE CONCENTRATION FOR MANY PATIENTS. HOWEVER, SOME ARE BEST TREATED AT CONCENTRATIONS OUTSIDE THIS RANGE. ACETAMINOPHEN CONCENTRATIONS >150 ug/mL AT 4 HOURS AFTER INGESTION AND >50 ug/mL AT 12 HOURS AFTER INGESTION ARE OFTEN ASSOCIATED WITH TOXIC REACTIONS.   Salicylate level     Status: None   Collection Time: 03/16/16  3:56 PM  Result Value Ref Range   Salicylate Lvl <5.5 2.8 - 30.0 mg/dL  Urine Drug Screen, Qualitative ([facility] only)     Status: Abnormal   Collection Time: 03/16/16  3:56 PM  Result Value Ref Range   Tricyclic, Ur Screen NONE DETECTED NONE DETECTED   Amphetamines, Ur Screen NONE DETECTED NONE DETECTED   MDMA (Ecstasy)Ur Screen NONE DETECTED NONE DETECTED   Cocaine Metabolite,Ur [place] NONE DETECTED NONE DETECTED   Opiate, Ur Screen NONE DETECTED NONE DETECTED   Phencyclidine (PCP) Ur S NONE DETECTED NONE DETECTED   Cannabinoid 50 Ng, Ur [place] NONE DETECTED NONE DETECTED   Barbiturates, Ur Screen POSITIVE (A) NONE DETECTED   Benzodiazepine, Ur Scrn NONE DETECTED NONE DETECTED   Methadone Scn, Ur NONE DETECTED NONE DETECTED    Comment: (NOTE) 732  Tricyclics, urine               Cutoff 1000 ng/mL 200  Amphetamines, urine             Cutoff 1000 ng/mL 300  MDMA (Ecstasy), urine           Cutoff 500 ng/mL 400  Cocaine Metabolite, urine       Cutoff 300 ng/mL 500  Opiate, urine                   Cutoff 300 ng/mL 600  Phencyclidine (PCP), urine      Cutoff 25 ng/mL 700  Cannabinoid, urine              Cutoff 50 ng/mL 800  Barbiturates, urine             Cutoff 200 ng/mL 900  Benzodiazepine, urine           Cutoff 200 ng/mL 1000 Methadone, urine                 Cutoff 300 ng/mL 1100 1200 The urine drug screen provides only a preliminary, unconfirmed 1300 analytical test result and should not be used for non-medical 1400 purposes. Clinical consideration and professional judgment should 1500 be applied to any positive drug screen result due to possible 1600 interfering substances. A more specific alternate chemical method 1700 must be used in order to obtain a confirmed analytical result.  1800 Gas chromato graphy / mass spectrometry (GC/MS) is the preferred 1900 confirmatory method.   Urinalysis complete, with microscopic ([facility] only)     Status: Abnormal   Collection Time: 03/16/16  3:56 PM  Result Value Ref Range   Color, Urine YELLOW (A) YELLOW   APPearance HAZY (A) CLEAR   Glucose, UA NEGATIVE NEGATIVE mg/dL   Bilirubin Urine NEGATIVE NEGATIVE   Ketones, ur TRACE (A) NEGATIVE mg/dL   Specific Gravity, Urine 1.024 1.005 - 1.030   Hgb urine dipstick NEGATIVE NEGATIVE   pH 5.0 5.0 - 8.0   Protein, ur NEGATIVE NEGATIVE mg/dL   Nitrite NEGATIVE NEGATIVE   Leukocytes, UA NEGATIVE NEGATIVE   RBC / HPF 0-5 0 - 5 RBC/hpf   WBC, UA 0-5 0 - 5 WBC/hpf   Bacteria, UA NONE SEEN NONE SEEN  Squamous Epithelial / LPF NONE SEEN NONE SEEN   Mucous PRESENT    Budding Yeast PRESENT     Current Facility-Administered Medications  Medication Dose Route Frequency Provider Last Rate Last Dose  . albuterol (PROVENTIL) (2.5 MG/3ML) 0.083% nebulizer solution 2.5 mg  2.5 mg Nebulization Q4H PRN Gonzella Lex, MD      . amLODipine (NORVASC) tablet 10 mg  10 mg Oral Daily Gonzella Lex, MD      . aspirin tablet 325 mg  325 mg Oral Daily Gonzella Lex, MD      . benazepril (LOTENSIN) tablet 5 mg  5 mg Oral Daily John T Clapacs, MD      . fluticasone (FLONASE) 50 MCG/ACT nasal spray 2 spray  2 spray Each Nare Daily Gonzella Lex, MD      . loratadine (CLARITIN) tablet 10 mg  10 mg Oral Daily John T Clapacs, MD      . mometasone-formoterol (DULERA) 100-5  MCG/ACT inhaler 2 puff  2 puff Inhalation BID Gonzella Lex, MD      . montelukast (SINGULAIR) tablet 10 mg  10 mg Oral QHS Gonzella Lex, MD      . oxybutynin (DITROPAN-XL) 24 hr tablet 5 mg  5 mg Oral QHS John T Clapacs, MD      . pantoprazole (PROTONIX) EC tablet 40 mg  40 mg Oral Daily John T Clapacs, MD      . sertraline (ZOLOFT) tablet 100 mg  100 mg Oral QHS John T Clapacs, MD      . sucralfate (CARAFATE) tablet 1 g  1 g Oral TID WC & HS John T Clapacs, MD      . tiZANidine (ZANAFLEX) tablet 4 mg  4 mg Oral Q6H PRN Gonzella Lex, MD      . traZODone (DESYREL) tablet 50 mg  50 mg Oral QHS Gonzella Lex, MD      . trifluoperazine (STELAZINE) tablet 5 mg  5 mg Oral QHS Gonzella Lex, MD       Current Outpatient Prescriptions  Medication Sig Dispense Refill  . acidophilus (RISAQUAD) CAPS capsule Take 1 capsule by mouth daily.    [name] Kitchen albuterol (PROVENTIL) (2.5 MG/3ML) 0.083% nebulizer solution Take 2.5 mg by nebulization 2 (two) times daily as needed for wheezing or shortness of breath.    [name] Kitchen amLODipine (NORVASC) 10 MG tablet TAKE 1 TABLET(10 MG) BY MOUTH DAILY 30 tablet 12  . aspirin EC 325 MG tablet Take 1 tablet (325 mg total) by mouth daily. 30 tablet 2  . benazepril (LOTENSIN) 5 MG tablet Take 1 tablet (5 mg total) by mouth daily. 30 tablet 5  . butalbital-acetaminophen-caffeine (FIORICET, ESGIC) 50-325-40 MG tablet TAKE 1 TABLET BY MOUTH EVERY 4 HOURS AS NEEDED FOR HEADACHE 30 tablet 5  . Calcium Carbonate-Vitamin D (CALCIUM 600+D) 600-400 MG-UNIT tablet Take 1 tablet by mouth 2 (two) times daily.    . Cranberry 250 MG CAPS Take 250 mg by mouth daily.     . fluticasone (FLONASE) 50 MCG/ACT nasal spray Place 1 spray into both nostrils daily.    . Fluticasone-Salmeterol (ADVAIR) 250-50 MCG/DOSE AEPB Inhale 1 puff into the lungs 2 (two) times daily.    [name] Kitchen glucosamine-chondroitin 500-400 MG tablet Take 1 tablet by mouth 2 (two) times daily.    . hydrOXYzine (ATARAX/VISTARIL) 25 MG tablet  Take 1 tablet (25 mg total) by mouth every 8 (eight) hours as needed for anxiety. 60 tablet 5  .  loratadine (CLARITIN) 10 MG tablet Take 10 mg by mouth daily.    . Magnesium Oxide 250 MG TABS Take 500 mg by mouth at bedtime.    . Misc Natural Products (OSTEO BI-FLEX JOINT SHIELD PO) Take 1 tablet by mouth 2 (two) times daily.    . montelukast (SINGULAIR) 10 MG tablet Take 10 mg by mouth daily.     . Multiple Vitamin (MULTIVITAMIN WITH MINERALS) TABS tablet Take 1 tablet by mouth daily.    [name] Kitchen omeprazole (PRILOSEC) 20 MG capsule Take 20 mg by mouth daily.    [name] Kitchen oxybutynin (DITROPAN-XL) 10 MG 24 hr tablet TAKE 1 TABLET BY MOUTH AT BEDTIME 30 tablet 5  . ranitidine (ZANTAC) 150 MG tablet Take 150 mg by mouth at bedtime.    . sertraline (ZOLOFT) 100 MG tablet Take 1 tablet (100 mg total) by mouth at bedtime. 30 tablet 2  . sucralfate (CARAFATE) 1 g tablet Take 1 tablet (1 g total) by mouth 2 (two) times daily. 20 tablet 0  . theophylline (UNIPHYL) 400 MG 24 hr tablet Take 400 mg by mouth daily.    [name] Kitchen tiZANidine (ZANAFLEX) 4 MG tablet Take 1 tablet (4 mg total) by mouth daily. 30 tablet 4  . traMADol (ULTRAM) 50 MG tablet TAKE 1 TABLET BY MOUTH EVERY 8 HOURS AS NEEDED 60 tablet 5  . traZODone (DESYREL) 50 MG tablet Take 2 tablets (100 mg total) by mouth at bedtime. 60 tablet 2  . trifluoperazine (STELAZINE) 5 MG tablet Take 1 tablet (5 mg total) by mouth at bedtime. 30 tablet 1    Musculoskeletal: Strength & Muscle Tone: within normal limits Gait & Station: unable to stand Patient leans: Backward  Psychiatric Specialty Exam: Physical Exam  Nursing note and vitals reviewed. Constitutional: She appears well-developed and well-nourished.  HENT:  Head: Normocephalic and atraumatic.  Eyes: Conjunctivae are normal. Pupils are equal, round, and reactive to light.  Neck: Normal range of motion.  Cardiovascular: Regular rhythm and normal heart sounds.   Respiratory: Effort normal. No respiratory  distress.  GI: Soft.  Musculoskeletal: Normal range of motion.  Neurological: She is alert.  Skin: Skin is warm and dry.  Psychiatric: Her mood appears anxious. Her affect is angry and inappropriate. Her speech is tangential. She is agitated. Thought content is paranoid and delusional. She expresses impulsivity. She expresses suicidal ideation. She exhibits abnormal recent memory and abnormal remote memory.    Review of Systems  Constitutional: Negative.   HENT: Negative.   Eyes: Negative.   Respiratory: Negative.   Cardiovascular: Negative.   Gastrointestinal: Negative.   Musculoskeletal: Positive for myalgias.  Skin: Negative.   Neurological: Negative.   Psychiatric/Behavioral: Positive for depression, memory loss and suicidal ideas. Negative for hallucinations and substance abuse. The patient is nervous/anxious and has insomnia.     Blood pressure (!) 172/97, pulse 73, temperature 97.4 F (36.3 C), temperature source Oral, resp. rate 15, SpO2 98 %.There is no height or weight on file to calculate BMI.  General Appearance: Disheveled  Eye Contact:  Fair  Speech:  Garbled  Volume:  Increased  Mood:  Irritable  Affect:  Labile  Thought Process:  Disorganized  Orientation:  Full (Time, Place, and Person)  Thought Content:  Illogical, Delusions and Rumination  Suicidal Thoughts:  Yes.  with intent/plan  Homicidal Thoughts:  No  Memory:  Immediate;   Fair Recent;   Fair Remote;   Fair  Judgement:  Impaired  Insight:  Shallow  Psychomotor Activity:  Decreased  Concentration:  Concentration: Poor  Recall:  [employer] of Knowledge:  Fair  Language:  Fair  Akathisia:  No  Handed:  Right  AIMS (if indicated):     Assets:  Housing Social Support  ADL's:  Intact  Cognition:  Impaired,  Mild  Sleep:        Treatment Plan Summary: Daily contact with patient to assess and evaluate symptoms and progress in treatment, Medication management and Plan 75 year old woman with a  history of schizophrenia. Serious suicide attempt. Disorganized and psychotic with suicidal ideation. Medically stabilizing. Recommend referral to geriatric psychiatry unit. Restart usual outpatient medicine including Stelazine. Patient advised of the plan. Case reviewed with the ER doctor and TTS.  Disposition: Recommend psychiatric Inpatient admission when medically cleared. Supportive therapy provided about ongoing stressors.  Alethia Berthold, MD 03/16/2016 6:24 PM

## 2016-03-17 DIAGNOSIS — J449 Chronic obstructive pulmonary disease, unspecified: Secondary | ICD-10-CM | POA: Diagnosis not present

## 2016-03-17 DIAGNOSIS — R918 Other nonspecific abnormal finding of lung field: Secondary | ICD-10-CM | POA: Diagnosis not present

## 2016-03-17 DIAGNOSIS — F209 Schizophrenia, unspecified: Secondary | ICD-10-CM | POA: Diagnosis not present

## 2016-03-17 DIAGNOSIS — R609 Edema, unspecified: Secondary | ICD-10-CM | POA: Diagnosis not present

## 2016-03-17 DIAGNOSIS — I10 Essential (primary) hypertension: Secondary | ICD-10-CM | POA: Diagnosis not present

## 2016-03-17 DIAGNOSIS — J441 Chronic obstructive pulmonary disease with (acute) exacerbation: Secondary | ICD-10-CM | POA: Diagnosis not present

## 2016-03-17 DIAGNOSIS — Z23 Encounter for immunization: Secondary | ICD-10-CM | POA: Diagnosis not present

## 2016-03-17 DIAGNOSIS — Z79899 Other long term (current) drug therapy: Secondary | ICD-10-CM | POA: Diagnosis not present

## 2016-03-17 DIAGNOSIS — F419 Anxiety disorder, unspecified: Secondary | ICD-10-CM | POA: Diagnosis not present

## 2016-03-17 DIAGNOSIS — T402X5A Adverse effect of other opioids, initial encounter: Secondary | ICD-10-CM | POA: Diagnosis not present

## 2016-03-17 DIAGNOSIS — J189 Pneumonia, unspecified organism: Secondary | ICD-10-CM | POA: Diagnosis not present

## 2016-03-17 DIAGNOSIS — T50992A Poisoning by other drugs, medicaments and biological substances, intentional self-harm, initial encounter: Secondary | ICD-10-CM | POA: Diagnosis not present

## 2016-03-17 DIAGNOSIS — E871 Hypo-osmolality and hyponatremia: Secondary | ICD-10-CM | POA: Diagnosis not present

## 2016-03-17 DIAGNOSIS — R0602 Shortness of breath: Secondary | ICD-10-CM | POA: Diagnosis not present

## 2016-03-17 DIAGNOSIS — R4701 Aphasia: Secondary | ICD-10-CM | POA: Diagnosis not present

## 2016-03-17 DIAGNOSIS — M70812 Other soft tissue disorders related to use, overuse and pressure, left shoulder: Secondary | ICD-10-CM | POA: Diagnosis not present

## 2016-03-17 DIAGNOSIS — G629 Polyneuropathy, unspecified: Secondary | ICD-10-CM | POA: Diagnosis not present

## 2016-03-17 DIAGNOSIS — J690 Pneumonitis due to inhalation of food and vomit: Secondary | ICD-10-CM | POA: Diagnosis not present

## 2016-03-17 DIAGNOSIS — M1990 Unspecified osteoarthritis, unspecified site: Secondary | ICD-10-CM | POA: Diagnosis not present

## 2016-03-17 DIAGNOSIS — K219 Gastro-esophageal reflux disease without esophagitis: Secondary | ICD-10-CM | POA: Diagnosis not present

## 2016-03-17 DIAGNOSIS — F200 Paranoid schizophrenia: Secondary | ICD-10-CM | POA: Diagnosis not present

## 2016-03-17 DIAGNOSIS — T887XXA Unspecified adverse effect of drug or medicament, initial encounter: Secondary | ICD-10-CM | POA: Diagnosis not present

## 2016-03-17 DIAGNOSIS — Z7982 Long term (current) use of aspirin: Secondary | ICD-10-CM | POA: Diagnosis not present

## 2016-03-17 DIAGNOSIS — Z87891 Personal history of nicotine dependence: Secondary | ICD-10-CM | POA: Diagnosis not present

## 2016-03-17 DIAGNOSIS — E860 Dehydration: Secondary | ICD-10-CM | POA: Diagnosis not present

## 2016-03-17 DIAGNOSIS — M25512 Pain in left shoulder: Secondary | ICD-10-CM | POA: Diagnosis not present

## 2016-03-17 LAB — HEPATIC FUNCTION PANEL
ALK PHOS: 61 U/L (ref 38–126)
ALT: 13 U/L — AB (ref 14–54)
AST: 17 U/L (ref 15–41)
Albumin: 3.2 g/dL — ABNORMAL LOW (ref 3.5–5.0)
BILIRUBIN INDIRECT: 0.7 mg/dL (ref 0.3–0.9)
Bilirubin, Direct: 0.1 mg/dL (ref 0.1–0.5)
TOTAL PROTEIN: 5.4 g/dL — AB (ref 6.5–8.1)
Total Bilirubin: 0.8 mg/dL (ref 0.3–1.2)

## 2016-03-17 LAB — ACETAMINOPHEN LEVEL: Acetaminophen (Tylenol), Serum: <10 ug/mL — ABNORMAL LOW (ref 10–30)

## 2016-03-17 LAB — TSH: TSH: 0.824 u[IU]/mL (ref 0.350–4.500)

## 2016-03-17 MED ORDER — HYDROXYZINE HCL 25 MG PO TABS
25.0000 mg | ORAL_TABLET | Freq: Once | ORAL | Status: AC
  Administered 2016-03-17: 25 mg via ORAL

## 2016-03-17 MED ORDER — DIPHENHYDRAMINE HCL 25 MG PO CAPS
50.0000 mg | ORAL_CAPSULE | Freq: Once | ORAL | Status: AC
  Administered 2016-03-17: 50 mg via ORAL

## 2016-03-17 MED ORDER — DIPHENHYDRAMINE HCL 25 MG PO CAPS
ORAL_CAPSULE | ORAL | Status: AC
  Administered 2016-03-17: 50 mg via ORAL
  Filled 2016-03-17: qty 2

## 2016-03-17 MED ORDER — HYDROXYZINE HCL 25 MG PO TABS
ORAL_TABLET | ORAL | Status: AC
  Administered 2016-03-17: 25 mg via ORAL
  Filled 2016-03-17: qty 1

## 2016-03-17 NOTE — ED Notes (Signed)
BEHAVIORAL HEALTH ROUNDING  Patient sleeping: No.  Patient alert and oriented: yes  Behavior appropriate: No ; If no, describe: Constantly yelling out that she is not insane and that she may go insane if people think that she's insane and that people need to read the Bible. When RN goes in to redirect and calm pt, she is able to be redirected and calmed, however within a couple of minutes of RN leaving the room, behavior resumes. Nutrition and fluids offered: yes  Toileting and hygiene offered: Yes  Sitter present: q15 minute observations and security monitoring  Law enforcement present: Yes ODS

## 2016-03-17 NOTE — BH Assessment (Signed)
Late Entry--------Writer spoke with patient's daughter and explain the timeframe of acute hospitalization. Also provided the daughter with contact information for [facility].

## 2016-03-17 NOTE — ED Notes (Signed)
Poison control called to get update on patient. Poison control updated, they had no further input for plan of care.

## 2016-03-17 NOTE — ED Notes (Signed)
Patient calling out frequently.  Yelling for nurse.  Repeatedly having conversation with patient re:  Expected transfer to [facility] and reassurance that the breakfast served was served to everyone.  Patient agitated about probable transfer to [facility] and upset about breakfast.  Reassurance and emotional support given repeatedly.  Moderately effective.  Continue to monitor.

## 2016-03-17 NOTE — ED Notes (Signed)
AAOx3[name] with son on phone.  States she would like her daughter to be here at the hospital to be her advocate.  Patient stated "I took too much of my medication because I had some things going on and I didn't know how else to handle it other than die."  Denies current SI/ HI.

## 2016-03-17 NOTE — ED Notes (Signed)
BEHAVIORAL HEALTH ROUNDING  Patient sleeping: Yes Patient alert and oriented: Sleeping Behavior appropriate: Yes. ; If no, describe:  Nutrition and fluids offered: No, sleeping  Toileting and hygiene offered: No, sleeping  Sitter present: q15 minute observations and security monitoring  Law enforcement present: Yes ODS 

## 2016-03-17 NOTE — BH Assessment (Signed)
Patient has been accepted to [facility].  Accepting physician is Dr. Geanie Kenning.  Call report to [phone].  Representative was [employer].  ER Staff is aware of it Lattie Haw, ER Sect.; Dr. Reita Cliche, ER MD & Remo Lipps, Patient's Nurse)

## 2016-03-17 NOTE — ED Notes (Signed)
BEHAVIORAL HEALTH ROUNDING Patient sleeping: No. Patient alert and oriented: yes Behavior appropriate: Yes.  ; If no, describe:  Nutrition and fluids offered: yes Toileting and hygiene offered: Yes  Sitter present: q15 minute observations and security  monitoring Law enforcement present: Yes  ODS  

## 2016-03-17 NOTE — ED Provider Notes (Signed)
I spoke with Kerry Dory, TTS, patient has been accepted to [facility] and will transfer by law enforcement.   Lisa Roca, MD 03/17/16 [phone]

## 2016-03-17 NOTE — ED Notes (Signed)
Pt in bed. Behavior is calm.

## 2016-03-18 DIAGNOSIS — J690 Pneumonitis due to inhalation of food and vomit: Secondary | ICD-10-CM | POA: Insufficient documentation

## 2016-03-21 DIAGNOSIS — E871 Hypo-osmolality and hyponatremia: Secondary | ICD-10-CM | POA: Insufficient documentation

## 2016-03-22 ENCOUNTER — Ambulatory Visit: Payer: Self-pay | Admitting: Family Medicine

## 2016-03-26 ENCOUNTER — Telehealth: Payer: Self-pay | Admitting: Family Medicine

## 2016-03-26 NOTE — Telephone Encounter (Signed)
Called Pt to schedule AWV with [facility] for Nov - knb °

## 2016-04-01 DIAGNOSIS — R609 Edema, unspecified: Secondary | ICD-10-CM | POA: Diagnosis not present

## 2016-04-02 ENCOUNTER — Other Ambulatory Visit: Payer: Self-pay | Admitting: Family Medicine

## 2016-04-09 ENCOUNTER — Telehealth: Payer: Self-pay

## 2016-04-09 NOTE — Telephone Encounter (Signed)
Medication management - Telephone call with patient after she left one requesting an appointment with Dr. Einar Grad following her recent release from [facility] per patient report.  Patient scheduled for 04/13/16 at 2pm and agreed to bring in discharge paperwork from hospital stay that date.

## 2016-04-13 ENCOUNTER — Ambulatory Visit (INDEPENDENT_AMBULATORY_CARE_PROVIDER_SITE_OTHER): Payer: BLUE CROSS/BLUE SHIELD | Admitting: Psychiatry

## 2016-04-13 DIAGNOSIS — F209 Schizophrenia, unspecified: Secondary | ICD-10-CM

## 2016-04-13 NOTE — Progress Notes (Signed)
Patient ID: Vicki Morales, female   DOB: 1941-02-25, 75 y.o.   MRN: [id number]   [facility] MD/PA/NP OP Progress Note  04/13/2016 1:58 PM CHRSTINA MOENCH  MRN:  [id number]  Subjective: . Patient was seen today with her daughter. Patient reports that [facility] since she had taken several medications mistakenly. Daughter reports that the patient did not have a chance to start taking the Stelazine and took several of her medications by mistake. [facility] patient was started on Seroquel 50 mg, gabapentin 600 mg 2 tablets at night and 100 mg twice daily. She was continued on the Zoloft at 100 mg daily and stopped on the trazodone. She was continued on the Stelazine. Currently patient reports that her mood is doing well but she continues to be very dizzy and has difficulty walking around but she did not have previously. She denies any suicidal thoughts.  Patient's daughter would like for patient to be back on her previous regimen since she is having a lot of dizziness on her current regimen  Chief Complaint: Mood is much improved  Visit Diagnosis:     ICD-9-CM ICD-10-CM   1. Schizophrenia, unspecified type ([place]) 295.90 F20.9     Past Medical History:  Past Medical History:  Diagnosis Date  . Anxiety   . COPD (chronic obstructive pulmonary disease) ([place])   . Depression   . GERD (gastroesophageal reflux disease)   . Hypertension     Past Surgical History:  Procedure Laterality Date  . ABDOMINAL HYSTERECTOMY  1988  . APPENDECTOMY  1988  . ESOPHAGOGASTRODUODENOSCOPY N/A 11/11/2014   Procedure: ESOPHAGOGASTRODUODENOSCOPY (EGD);  Surgeon: Josefine Class, MD;  Location: [facility] ENDOSCOPY;  Service: Endoscopy;  Laterality: N/A;  . TONSILLECTOMY AND ADENOIDECTOMY    . TRIGGER FINGER RELEASE  2004   Family History:  Family History  Problem Relation Age of Onset  . Breast cancer Sister   . Stroke Father   . Emphysema Father   .  Anxiety disorder Father   . Depression Father   . Anxiety disorder Brother    Social History:  Social History   Social History  . Marital status: Widowed    Spouse name: N/A  . Number of children: N/A  . Years of education: N/A   Social History Main Topics  . Smoking status: Former Smoker    Years: 30.00  . Smokeless tobacco: Never Used     Comment: quit in 1990's  . Alcohol use No  . Drug use: No  . Sexual activity: Not Currently   Other Topics Concern  . Not on file   Social History Narrative  . No narrative on file   Additional History:   Assessment:   Musculoskeletal: Strength & Muscle Tone: within normal limits Gait & Station: Slow and walks with a cane Patient leans: N/A  Psychiatric Specialty Exam: Medication Refill     Review of Systems  Psychiatric/Behavioral: Negative for depression, hallucinations, memory loss, substance abuse and suicidal ideas. The patient is not nervous/anxious and does not have insomnia.   All other systems reviewed and are negative.   There were no vitals taken for this visit.There is no height or weight on file to calculate BMI.  General Appearance: Well Groomed  Eye Contact:  Good  Speech:  Normal Rate  Volume:  Normal  Mood:  good  Affect:  Congruent Bright, smiling   Thought Process:  Linear and Logical  Orientation:  Full (Time, Place, and Person)  Thought Content:  Negative  Suicidal Thoughts:  No  Homicidal Thoughts:  No  Memory:  Immediate;   Good Recent;   Good Remote;   Good  Judgement:  Good  Insight:  Good  Psychomotor Activity:  Normal  Concentration:  Good  Recall:  Good  Fund of Knowledge: Good  Language: Good  Akathisia:  Negative  Handed:    AIMS (if indicated):  Mild cogwheeling noted on right arm   Assets:  Communication Skills Desire for Improvement  ADL's:  Intact  Cognition: WNL  Sleep:  fair   Is the patient at risk to self?  No. Has the patient been a risk to self in the past 6  months?  No. Has the patient been a risk to self within the distant past?  Yes.  OD in 1979 Is the patient a risk to others?  No. Has the patient been a risk to others in the past 6 months?  No. Has the patient been a risk to others within the distant past?  No.  Current Medications: Current Outpatient Prescriptions  Medication Sig Dispense Refill  . acidophilus (RISAQUAD) CAPS capsule Take 1 capsule by mouth daily.    [name] Kitchen albuterol (PROVENTIL) (2.5 MG/3ML) 0.083% nebulizer solution Take 2.5 mg by nebulization 2 (two) times daily as needed for wheezing or shortness of breath.    [name] Kitchen amLODipine (NORVASC) 10 MG tablet TAKE 1 TABLET(10 MG) BY MOUTH DAILY 30 tablet 12  . aspirin EC 325 MG tablet Take 1 tablet (325 mg total) by mouth daily. 30 tablet 2  . benazepril (LOTENSIN) 5 MG tablet Take 1 tablet (5 mg total) by mouth daily. 30 tablet 5  . butalbital-acetaminophen-caffeine (FIORICET, ESGIC) 50-325-40 MG tablet TAKE 1 TABLET BY MOUTH EVERY 4 HOURS AS NEEDED FOR HEADACHE 30 tablet 5  . Calcium Carbonate-Vitamin D (CALCIUM 600+D) 600-400 MG-UNIT tablet Take 1 tablet by mouth 2 (two) times daily.    . Cranberry 250 MG CAPS Take 250 mg by mouth daily.     . fluticasone (FLONASE) 50 MCG/ACT nasal spray Place 1 spray into both nostrils daily.    . Fluticasone-Salmeterol (ADVAIR) 250-50 MCG/DOSE AEPB Inhale 1 puff into the lungs 2 (two) times daily.    [name] Kitchen glucosamine-chondroitin 500-400 MG tablet Take 1 tablet by mouth 2 (two) times daily.    . hydrOXYzine (ATARAX/VISTARIL) 25 MG tablet Take 1 tablet (25 mg total) by mouth every 8 (eight) hours as needed for anxiety. 60 tablet 5  . loratadine (CLARITIN) 10 MG tablet Take 10 mg by mouth daily.    . Magnesium Oxide 250 MG TABS Take 500 mg by mouth at bedtime.    . Misc Natural Products (OSTEO BI-FLEX JOINT SHIELD PO) Take 1 tablet by mouth 2 (two) times daily.    . montelukast (SINGULAIR) 10 MG tablet Take 10 mg by mouth daily.     . montelukast  (SINGULAIR) 10 MG tablet TAKE 1 TABLET BY MOUTH EVERY DAY 30 tablet 0  . Multiple Vitamin (MULTIVITAMIN WITH MINERALS) TABS tablet Take 1 tablet by mouth daily.    [name] Kitchen omeprazole (PRILOSEC) 20 MG capsule Take 20 mg by mouth daily.    [name] Kitchen oxybutynin (DITROPAN-XL) 10 MG 24 hr tablet TAKE 1 TABLET BY MOUTH AT BEDTIME 30 tablet 5  . ranitidine (ZANTAC) 150 MG tablet Take 150 mg by mouth at bedtime.    . sertraline (ZOLOFT) 100 MG tablet Take 1 tablet (100 mg total)  by mouth at bedtime. 30 tablet 2  . sucralfate (CARAFATE) 1 g tablet Take 1 tablet (1 g total) by mouth 2 (two) times daily. 20 tablet 0  . theophylline (UNIPHYL) 400 MG 24 hr tablet Take 400 mg by mouth daily.    [name] Kitchen tiZANidine (ZANAFLEX) 4 MG tablet Take 1 tablet (4 mg total) by mouth daily. 30 tablet 4  . traMADol (ULTRAM) 50 MG tablet TAKE 1 TABLET BY MOUTH EVERY 8 HOURS AS NEEDED 60 tablet 5  . trifluoperazine (STELAZINE) 5 MG tablet Take 1 tablet (5 mg total) by mouth at bedtime. 30 tablet 1   No current facility-administered medications for this visit.     Medical Decision Making:  Established Problem, Stable/Improving (1) and Review of Medication Regimen & Side Effects (2)  Treatment Plan Summary:Medication management and Plan Plan Schizophrenia-  Continue sertraline at 100 mg daily  Discontinue the seroquel  Discontinue gabapentin 600mg  at bedtime, continue with 100mg  po bid and 600mg  at bedtime.  Discontinue trazodone for now since patient was discontinued [facility] this medication at [facility]. continue Stelazine at 10  mg once daily at bedtime  She will follow-up in 2-3 weeks or call before if needed  Janyiah Silveri 04/13/2016, 1:58 PM

## 2016-04-14 ENCOUNTER — Telehealth: Payer: Self-pay

## 2016-04-14 ENCOUNTER — Ambulatory Visit: Payer: Self-pay | Admitting: Family Medicine

## 2016-04-14 NOTE — Telephone Encounter (Signed)
Medication management - patient's daughter called attempting to reach Dr Einar Grad as states she had a message to call her back following patient's evaluation from 04/13/16.  Reviewed patient's medications from 04/13/16 and changes Dr. Einar Grad made with medications at evaluation.  Agreed to request Dr. Einar Grad call Ms. Freundlich, daughter back upon her return to discuss further or if anything else Dr. Einar Grad would like to discuss regarding patients care.  Ms. Menges agreed with plan and message left for Dr. Einar Grad.

## 2016-04-15 NOTE — Telephone Encounter (Signed)
Medication management - Left a message for patient's daughter to call back if she or patient felt patient needed something for being more anxious since they have been trying to cut back on medication.  Requested they call back if felt this was a need to discuss with Dr. Einar Grad.

## 2016-04-15 NOTE — Telephone Encounter (Signed)
Patient had called me yesterday afternoon and stated that she was feeling anxious and wanted to take something. Can you please check with her daughter if the she thinks patient needs to take any extra medication for anxiety. Patient has become confused and more sedated on medication previously and we are trying to taper down thank you

## 2016-04-20 ENCOUNTER — Ambulatory Visit: Payer: Self-pay | Admitting: Family Medicine

## 2016-04-21 ENCOUNTER — Ambulatory Visit (INDEPENDENT_AMBULATORY_CARE_PROVIDER_SITE_OTHER): Payer: Commercial Managed Care - HMO | Admitting: Family Medicine

## 2016-04-21 ENCOUNTER — Encounter: Payer: Self-pay | Admitting: Family Medicine

## 2016-04-21 VITALS — BP 104/54 | HR 80 | Temp 98.2°F | Resp 16 | Wt 250.0 lb

## 2016-04-21 DIAGNOSIS — R8290 Unspecified abnormal findings in urine: Secondary | ICD-10-CM | POA: Diagnosis not present

## 2016-04-21 DIAGNOSIS — N390 Urinary tract infection, site not specified: Secondary | ICD-10-CM

## 2016-04-21 DIAGNOSIS — I10 Essential (primary) hypertension: Secondary | ICD-10-CM | POA: Diagnosis not present

## 2016-04-21 LAB — POCT URINALYSIS DIPSTICK
Bilirubin, UA: NEGATIVE
Glucose, UA: NEGATIVE
KETONES UA: NEGATIVE
NITRITE UA: NEGATIVE
PH UA: 6
PROTEIN UA: NEGATIVE
RBC UA: NEGATIVE
Spec Grav, UA: 1.025
Urobilinogen, UA: 0.2

## 2016-04-21 MED ORDER — BENAZEPRIL HCL 5 MG PO TABS: 5.0000 mg | ORAL_TABLET | Freq: Every day | ORAL | 5 refills | Status: DC

## 2016-04-21 MED ORDER — GABAPENTIN 300 MG PO CAPS: 600.0000 mg | ORAL_CAPSULE | Freq: Every day | ORAL | 11 refills | Status: DC

## 2016-04-21 MED ORDER — CIPROFLOXACIN HCL 500 MG PO TABS: 500.0000 mg | ORAL_TABLET | Freq: Two times a day (BID) | ORAL | 0 refills | Status: AC

## 2016-04-21 MED ORDER — GABAPENTIN 100 MG PO CAPS: 100.0000 mg | ORAL_CAPSULE | Freq: Two times a day (BID) | ORAL | 11 refills | Status: DC

## 2016-04-21 NOTE — Progress Notes (Signed)
Patient: Vicki Morales Female    DOB: 1941/04/22   75 y.o.   MRN: [id number] Visit Date: 04/21/2016  Today's Provider: Lelon Huh, MD   Chief Complaint  Patient presents with  . Medication Management   Subjective:    HPI  Complains of having discolored urine and urinary frequency for the last couple of days. Having some aching in her back more than usual. No fevers or chills, no nausea or abdominal pain.   Also patient was admitted to [facility] 03/16/2016 for schizophrenia. Patient was seen by behavorial health,they discontinued Abilify. Started stelazine  5 mg qhs. Patient advised to follow-up in 1 month with behavorial health. Patient was to continue sertraline 100 qd and Trazodone 100 mg qhs.   Allergies  Allergen Reactions  . Arthrotec [Diclofenac-Misoprostol] Hives  . Codeine Other (See Comments)    Reaction:  Headache   . Diclofenac     Pt unsure allergy  . Hydrochlorothiazide Other (See Comments)    Reaction:  Weakness   . Morphine Hives  . Penicillins Hives and Other (See Comments)    Has patient had a PCN reaction causing immediate rash, facial/tongue/throat swelling, SOB or lightheadedness with hypotension: No Has patient had a PCN reaction causing severe rash involving mucus membranes or skin necrosis: No Has patient had a PCN reaction that required hospitalization No Has patient had a PCN reaction occurring within the last 10 years: No If all of the above answers are "NO", then may proceed with Cephalosporin use.  . Sulfa Antibiotics Hives  . Tiotropium Bromide Monohydrate Other (See Comments)    Reaction:  Blurry vision   . Tylenol [Acetaminophen]   . Pantoprazole Rash     Current Outpatient Prescriptions:  .  acidophilus (RISAQUAD) CAPS capsule, Take 1 capsule by mouth daily., Disp: , Rfl:  .  albuterol (PROVENTIL) (2.5 MG/3ML) 0.083% nebulizer solution, Take 2.5 mg by nebulization 2 (two) times daily as needed for wheezing or shortness of  breath., Disp: , Rfl:  .  amLODipine (NORVASC) 10 MG tablet, TAKE 1 TABLET(10 MG) BY MOUTH DAILY, Disp: 30 tablet, Rfl: 12 .  aspirin EC 325 MG tablet, Take 1 tablet (325 mg total) by mouth daily., Disp: 30 tablet, Rfl: 2 .  benazepril (LOTENSIN) 5 MG tablet, Take 1 tablet (5 mg total) by mouth daily., Disp: 30 tablet, Rfl: 5 .  butalbital-acetaminophen-caffeine (FIORICET, ESGIC) 50-325-40 MG tablet, TAKE 1 TABLET BY MOUTH EVERY 4 HOURS AS NEEDED FOR HEADACHE, Disp: 30 tablet, Rfl: 5 .  Calcium Carbonate-Vitamin D (CALCIUM 600+D) 600-400 MG-UNIT tablet, Take 1 tablet by mouth 2 (two) times daily., Disp: , Rfl:  .  Cranberry 250 MG CAPS, Take 250 mg by mouth daily. , Disp: , Rfl:  .  fluticasone (FLONASE) 50 MCG/ACT nasal spray, Place 1 spray into both nostrils daily., Disp: , Rfl:  .  Fluticasone-Salmeterol (ADVAIR) 250-50 MCG/DOSE AEPB, Inhale 1 puff into the lungs 2 (two) times daily., Disp: , Rfl:  .  gabapentin (NEURONTIN) 100 MG capsule, Take 1 capsule (100 mg total) by mouth 2 (two) times daily., Disp: 60 capsule, Rfl: 11 .  gabapentin (NEURONTIN) 300 MG capsule, Take 2 capsules (600 mg total) by mouth at bedtime., Disp: 60 capsule, Rfl: 11 .  glucosamine-chondroitin 500-400 MG tablet, Take 1 tablet by mouth 2 (two) times daily., Disp: , Rfl:  .  hydrALAZINE (APRESOLINE) 50 MG tablet, Take 1 tablet by mouth 2 (two) times daily. , Disp: , Rfl:  .  hydrOXYzine (ATARAX/VISTARIL) 25 MG tablet, Take 1 tablet (25 mg total) by mouth every 8 (eight) hours as needed for anxiety., Disp: 60 tablet, Rfl: 5 .  Lactobacillus (ACIDOPHILUS) CAPS capsule, Take 1 capsule by mouth daily., Disp: , Rfl:  .  loratadine (CLARITIN) 10 MG tablet, Take 10 mg by mouth daily., Disp: , Rfl:  .  LORazepam (ATIVAN) 0.5 MG tablet, Take 1 tablet by mouth daily., Disp: , Rfl: 0 .  LORazepam (ATIVAN) 1 MG tablet, Take 1 tablet by mouth daily., Disp: , Rfl: 0 .  Magnesium Oxide 250 MG TABS, Take 500 mg by mouth at bedtime.,  Disp: , Rfl:  .  Melatonin 3 MG TABS, Take 1 tablet by mouth. 1 hour before bedtime, Disp: , Rfl:  .  montelukast (SINGULAIR) 10 MG tablet, TAKE 1 TABLET BY MOUTH EVERY DAY, Disp: 30 tablet, Rfl: 0 .  Multiple Vitamin (MULTIVITAMIN WITH MINERALS) TABS tablet, Take 1 tablet by mouth daily., Disp: , Rfl:  .  omeprazole (PRILOSEC) 20 MG capsule, Take 20 mg by mouth daily., Disp: , Rfl:  .  oxybutynin (DITROPAN-XL) 10 MG 24 hr tablet, TAKE 1 TABLET BY MOUTH AT BEDTIME, Disp: 30 tablet, Rfl: 5 .  QUEtiapine (SEROQUEL) 50 MG tablet, Take 1 tablet by mouth at bedtime., Disp: , Rfl:  .  ranitidine (ZANTAC) 150 MG tablet, Take 150 mg by mouth at bedtime., Disp: , Rfl:  .  sertraline (ZOLOFT) 100 MG tablet, Take 1 tablet (100 mg total) by mouth at bedtime., Disp: 30 tablet, Rfl: 2 .  sucralfate (CARAFATE) 1 g tablet, Take 1 tablet (1 g total) by mouth 2 (two) times daily., Disp: 20 tablet, Rfl: 0 .  traMADol (ULTRAM) 50 MG tablet, TAKE 1 TABLET BY MOUTH EVERY 8 HOURS AS NEEDED, Disp: 60 tablet, Rfl: 5 .  trifluoperazine (STELAZINE) 5 MG tablet, Take 1 tablet (5 mg total) by mouth at bedtime., Disp: 30 tablet, Rfl: 1 .  theophylline (UNIPHYL) 400 MG 24 hr tablet, Take 400 mg by mouth daily., Disp: , Rfl:  .  tiZANidine (ZANAFLEX) 4 MG tablet, Take 1 tablet (4 mg total) by mouth daily. (Patient not taking: Reported on 04/21/2016), Disp: 30 tablet, Rfl: 4  Review of Systems  Constitutional: Negative for appetite change, chills, fatigue and fever.  Respiratory: Negative for chest tightness and shortness of breath.   Cardiovascular: Negative for chest pain and palpitations.  Gastrointestinal: Negative for abdominal pain, nausea and vomiting.  Neurological: Negative for dizziness and weakness.    Social History  Substance Use Topics  . Smoking status: Former Smoker    Years: 30.00  . Smokeless tobacco: Never Used     Comment: quit in 1990's  . Alcohol use No   Objective:   BP (!) 104/54 (BP Location:  Left Arm, Patient Position: Sitting, Cuff Size: Large)   Pulse 80   Temp 98.2 F (36.8 C) (Oral)   Resp 16   Wt 250 lb (113.4 kg)   SpO2 94%   BMI 44.29 kg/m   Physical Exam  General Appearance:    Alert, cooperative, no distress  Eyes:    PERRL, conjunctiva/corneas clear, EOM's intact       Lungs:     Clear to auscultation bilaterally, respirations unlabored  Heart:    Regular rate and rhythm  Abdomen:   bowel sounds present and normal in all 4 quadrants, soft, round, nontender or nondistended. No CVA tenderness       Results for orders placed or  performed in visit on 04/21/16  POCT urinalysis dipstick  Result Value Ref Range   Color, UA Amber    Clarity, UA Turbid    Glucose, UA Neg    Bilirubin, UA Neg    Ketones, UA Neg    Spec Grav, UA 1.025    Blood, UA Neg    pH, UA 6.0    Protein, UA Neg    Urobilinogen, UA 0.2    Nitrite, UA Neg    Leukocytes, UA moderate (2+) (A) Negative       Assessment & Plan:     1. Bad odor of urine  - POCT urinalysis dipstick  2. Urinary tract infection without hematuria, site unspecified  - ciprofloxacin (CIPRO) 500 MG tablet; Take 1 tablet (500 mg total) by mouth 2 (two) times daily.  Dispense: 14 tablet; Refill: 0 - Urine culture  3. Essential hypertension        Lelon Huh, MD  [facility]

## 2016-04-22 ENCOUNTER — Other Ambulatory Visit: Payer: Self-pay | Admitting: Family Medicine

## 2016-04-23 LAB — URINE CULTURE

## 2016-04-26 ENCOUNTER — Telehealth: Payer: Self-pay

## 2016-04-26 NOTE — Telephone Encounter (Signed)
Dorian Pod from [facility] called wanting to know if we can change patients prescription for Cipro? Pharmacist states that patient has complained that medication is making her very drowsy. Please advise. KW

## 2016-04-27 ENCOUNTER — Encounter: Payer: Self-pay | Admitting: Psychiatry

## 2016-04-27 ENCOUNTER — Ambulatory Visit (INDEPENDENT_AMBULATORY_CARE_PROVIDER_SITE_OTHER): Payer: BLUE CROSS/BLUE SHIELD | Admitting: Psychiatry

## 2016-04-27 VITALS — BP 124/64 | HR 81

## 2016-04-27 DIAGNOSIS — F209 Schizophrenia, unspecified: Secondary | ICD-10-CM

## 2016-04-27 NOTE — Progress Notes (Signed)
Patient ID: Vicki Morales, female   DOB: 09-19-1940, 75 y.o.   MRN: [id number]   [facility] MD/PA/NP OP Progress Note  04/27/2016 2:20 PM Vicki Morales  MRN:  [id number]  Subjective: . Patient was seen today with her daughter. She reports doing well except for continuing to feel sleepy all day. During her medication check patient endorsed that the she's been getting Benadryl every 8 hours. Patient has also been restarted on her gabapentin at 600 mg twice daily. States that her mood has been doing okay. She is in a good mood today and states that she exercises a lot daily and tries to walk up and down the hall in her apartment. She denies any suicidal thoughts.   Chief Complaint: Mood is much improved, feels quite a bit sleepy during the daytime Chief Complaint    Follow-up     Visit Diagnosis:     ICD-9-CM ICD-10-CM   1. Schizophrenia, unspecified type ([place]) 295.90 F20.9     Past Medical History:  Past Medical History:  Diagnosis Date  . Anxiety   . COPD (chronic obstructive pulmonary disease) ([place])   . Depression   . GERD (gastroesophageal reflux disease)   . Hypertension     Past Surgical History:  Procedure Laterality Date  . ABDOMINAL HYSTERECTOMY  1988  . APPENDECTOMY  1988  . ESOPHAGOGASTRODUODENOSCOPY N/A 11/11/2014   Procedure: ESOPHAGOGASTRODUODENOSCOPY (EGD);  Surgeon: Josefine Class, MD;  [facility] ENDOSCOPY;  Service: Endoscopy;  Laterality: N/A;  . TONSILLECTOMY AND ADENOIDECTOMY    . TRIGGER FINGER RELEASE  2004   Family History:  Family History  Problem Relation Age of Onset  . Breast cancer Sister   . Stroke Father   . Emphysema Father   . Anxiety disorder Father   . Depression Father   . Anxiety disorder Brother    Social History:  Social History   Social History  . Marital status: Widowed    Spouse name: N/A  . Number of children: N/A  . Years of education: N/A   Social History Main Topics  . Smoking status: Former Smoker    Years: 30.00   . Smokeless tobacco: Never Used     Comment: quit in 1990's  . Alcohol use No  . Drug use: No  . Sexual activity: Not Currently   Other Topics Concern  . None   Social History Narrative  . None   Additional History:   Assessment:   Musculoskeletal: Strength & Muscle Tone: within normal limits Gait & Station: Slow and walks with a cane Patient leans: N/A  Psychiatric Specialty Exam: Medication Refill     Review of Systems  Psychiatric/Behavioral: Negative for depression, hallucinations, memory loss, substance abuse and suicidal ideas. The patient is not nervous/anxious and does not have insomnia.   All other systems reviewed and are negative.   Blood pressure 124/64, pulse 81.There is no height or weight on file to calculate BMI.  General Appearance: Well Groomed  Eye Contact:  Good  Speech:  Normal Rate  Volume:  Normal  Mood:  good  Affect:  Congruent Bright, smiling   Thought Process:  Linear and Logical  Orientation:  Full (Time, Place, and Person)  Thought Content:  Negative  Suicidal Thoughts:  No  Homicidal Thoughts:  No  Memory:  Immediate;   Good Recent;   Good Remote;   Good  Judgement:  Good  Insight:  Good  Psychomotor Activity:  Normal  Concentration:  Good  Recall:  Good  Fund of Knowledge: Good  Language: Good  Akathisia:  Negative  Handed:    AIMS (if indicated):  Mild cogwheeling noted on right arm   Assets:  Communication Skills Desire for Improvement  ADL's:  Intact  Cognition: WNL  Sleep:  fair   Is the patient at risk to self?  No. Has the patient been a risk to self in the past 6 months?  No. Has the patient been a risk to self within the distant past?  Yes.  OD in 1979 Is the patient a risk to others?  No. Has the patient been a risk to others in the past 6 months?  No. Has the patient been a risk to others within the distant past?  No.  Current Medications: Current Outpatient Prescriptions  Medication Sig Dispense Refill   . acidophilus (RISAQUAD) CAPS capsule Take 1 capsule by mouth daily.    [name] Kitchen albuterol (PROVENTIL) (2.5 MG/3ML) 0.083% nebulizer solution Take 2.5 mg by nebulization 2 (two) times daily as needed for wheezing or shortness of breath.    [name] Kitchen amLODipine (NORVASC) 10 MG tablet TAKE 1 TABLET(10 MG) BY MOUTH DAILY 30 tablet 12  . aspirin EC 325 MG tablet Take 1 tablet (325 mg total) by mouth daily. 30 tablet 2  . benazepril (LOTENSIN) 5 MG tablet Take 1 tablet (5 mg total) by mouth daily. 30 tablet 5  . butalbital-acetaminophen-caffeine (FIORICET, ESGIC) 50-325-40 MG tablet TAKE 1 TABLET BY MOUTH EVERY 4 HOURS AS NEEDED FOR HEADACHE 30 tablet 5  . Calcium Carbonate-Vitamin D (CALCIUM 600+D) 600-400 MG-UNIT tablet Take 1 tablet by mouth 2 (two) times daily.    . ciprofloxacin (CIPRO) 500 MG tablet Take 1 tablet (500 mg total) by mouth 2 (two) times daily. 14 tablet 0  . Cranberry 250 MG CAPS Take 250 mg by mouth daily.     . fluticasone (FLONASE) 50 MCG/ACT nasal spray Place 1 spray into both nostrils daily.    . Fluticasone-Salmeterol (ADVAIR) 250-50 MCG/DOSE AEPB Inhale 1 puff into the lungs 2 (two) times daily.    [name] Kitchen gabapentin (NEURONTIN) 100 MG capsule Take 1 capsule (100 mg total) by mouth 2 (two) times daily. 60 capsule 11  . gabapentin (NEURONTIN) 300 MG capsule Take 2 capsules (600 mg total) by mouth at bedtime. 60 capsule 11  . glucosamine-chondroitin 500-400 MG tablet Take 1 tablet by mouth 2 (two) times daily.    . hydrALAZINE (APRESOLINE) 50 MG tablet Take 1 tablet by mouth 2 (two) times daily.     . Lactobacillus (ACIDOPHILUS) CAPS capsule Take 1 capsule by mouth daily.    [name] Kitchen loratadine (CLARITIN) 10 MG tablet Take 10 mg by mouth daily.    [name] Kitchen LORazepam (ATIVAN) 0.5 MG tablet Take 1 tablet by mouth daily.  0  . LORazepam (ATIVAN) 1 MG tablet Take 1 tablet by mouth daily.  0  . Magnesium Oxide 250 MG TABS Take 500 mg by mouth at bedtime.    . Melatonin 3 MG TABS Take 1 tablet by mouth. 1 hour  before bedtime    . montelukast (SINGULAIR) 10 MG tablet TAKE 1 TABLET BY MOUTH EVERY DAY 30 tablet 12  . Multiple Vitamin (MULTIVITAMIN WITH MINERALS) TABS tablet Take 1 tablet by mouth daily.    [name] Kitchen omeprazole (PRILOSEC) 20 MG capsule Take 20 mg by mouth daily.    [name] Kitchen oxybutynin (DITROPAN-XL) 10 MG 24 hr tablet TAKE 1 TABLET BY MOUTH AT BEDTIME 30 tablet 5  . QUEtiapine (SEROQUEL) 50  MG tablet Take 1 tablet by mouth at bedtime.    . ranitidine (ZANTAC) 150 MG tablet Take 150 mg by mouth at bedtime.    . sertraline (ZOLOFT) 100 MG tablet Take 1 tablet (100 mg total) by mouth at bedtime. 30 tablet 2  . sucralfate (CARAFATE) 1 g tablet Take 1 tablet (1 g total) by mouth 2 (two) times daily. 20 tablet 0  . theophylline (UNIPHYL) 400 MG 24 hr tablet Take 400 mg by mouth daily.    [name] Kitchen tiZANidine (ZANAFLEX) 4 MG tablet Take 1 tablet (4 mg total) by mouth daily. 30 tablet 4  . traMADol (ULTRAM) 50 MG tablet TAKE 1 TABLET BY MOUTH EVERY 8 HOURS AS NEEDED 60 tablet 5  . trifluoperazine (STELAZINE) 5 MG tablet Take 1 tablet (5 mg total) by mouth at bedtime. 30 tablet 1  . hydrOXYzine (ATARAX/VISTARIL) 25 MG tablet Take 1 tablet (25 mg total) by mouth every 8 (eight) hours as needed for anxiety. (Patient not taking: Reported on 04/27/2016) 60 tablet 5   No current facility-administered medications for this visit.     Medical Decision Making:  Established Problem, Stable/Improving (1) and Review of Medication Regimen & Side Effects (2)  Treatment Plan Summary:Medication management and Plan Plan Schizophrenia-  Continue sertraline at 100 mg daily  Continue gabapentin 600mg  at bedtime, continue with 100mg  po bid and 600mg  at bedtime.  continue Stelazine at 5  mg once daily at bedtime  Discontinue the Benadryl  She will follow-up in 4 weeks or call before if needed  Shalisha Clausing 04/27/2016, 2:20 PM

## 2016-05-12 DIAGNOSIS — H524 Presbyopia: Secondary | ICD-10-CM | POA: Diagnosis not present

## 2016-05-24 ENCOUNTER — Telehealth: Payer: Self-pay | Admitting: Psychiatry

## 2016-05-24 NOTE — Telephone Encounter (Signed)
Jessi can you please check with her what medications she needs and which medication she states it take to the pharmacy. You may need to speak with her daughter since patient frequently gets confused about her medications thank you

## 2016-05-25 ENCOUNTER — Ambulatory Visit: Payer: No Typology Code available for payment source | Admitting: Psychiatry

## 2016-06-08 NOTE — Telephone Encounter (Signed)
left message on doctor  line at pharmacy.  ok to give zoloft 100mg  #30 no additional refill and trifluoperazine (STELAZINE) 5 MG tablet #30 no additional refills

## 2016-06-10 ENCOUNTER — Encounter: Payer: Self-pay | Admitting: Psychiatry

## 2016-06-11 ENCOUNTER — Ambulatory Visit: Payer: Self-pay | Admitting: Family Medicine

## 2016-06-25 ENCOUNTER — Ambulatory Visit: Payer: Self-pay | Admitting: Family Medicine

## 2016-06-28 DIAGNOSIS — H2513 Age-related nuclear cataract, bilateral: Secondary | ICD-10-CM | POA: Diagnosis not present

## 2016-07-01 ENCOUNTER — Ambulatory Visit: Payer: No Typology Code available for payment source | Admitting: Psychiatry

## 2016-07-05 ENCOUNTER — Ambulatory Visit: Payer: Self-pay | Admitting: Family Medicine

## 2016-07-05 DIAGNOSIS — H2513 Age-related nuclear cataract, bilateral: Secondary | ICD-10-CM | POA: Diagnosis not present

## 2016-07-06 ENCOUNTER — Ambulatory Visit: Payer: Self-pay | Admitting: Family Medicine

## 2016-07-08 ENCOUNTER — Encounter: Payer: Self-pay | Admitting: *Deleted

## 2016-07-09 ENCOUNTER — Ambulatory Visit: Payer: Self-pay | Admitting: Family Medicine

## 2016-07-13 NOTE — H&P (Signed)
See scanned note.

## 2016-07-14 ENCOUNTER — Encounter
Admission: RE | Disposition: A | Discharge status: Home / Self Care | Payer: Self-pay | Source: Ambulatory Visit | Attending: Ophthalmology

## 2016-07-14 ENCOUNTER — Ambulatory Visit: Payer: Medicare HMO | Admitting: Certified Registered Nurse Anesthetist

## 2016-07-14 ENCOUNTER — Ambulatory Visit
Admission: RE | Admit: 2016-07-14 | Discharge: 2016-07-14 | Disposition: A | Discharge status: Home / Self Care | Payer: Medicare HMO | Source: Ambulatory Visit | Attending: Ophthalmology | Admitting: Ophthalmology

## 2016-07-14 ENCOUNTER — Encounter: Payer: Self-pay | Admitting: *Deleted

## 2016-07-14 DIAGNOSIS — Z7982 Long term (current) use of aspirin: Secondary | ICD-10-CM | POA: Diagnosis not present

## 2016-07-14 DIAGNOSIS — H2513 Age-related nuclear cataract, bilateral: Secondary | ICD-10-CM | POA: Diagnosis not present

## 2016-07-14 DIAGNOSIS — Z87891 Personal history of nicotine dependence: Secondary | ICD-10-CM | POA: Insufficient documentation

## 2016-07-14 DIAGNOSIS — H59221 Accidental puncture and laceration of right eye and adnexa during other procedure: Secondary | ICD-10-CM | POA: Diagnosis not present

## 2016-07-14 DIAGNOSIS — K219 Gastro-esophageal reflux disease without esophagitis: Secondary | ICD-10-CM | POA: Diagnosis not present

## 2016-07-14 DIAGNOSIS — J449 Chronic obstructive pulmonary disease, unspecified: Secondary | ICD-10-CM | POA: Diagnosis not present

## 2016-07-14 DIAGNOSIS — H2511 Age-related nuclear cataract, right eye: Secondary | ICD-10-CM | POA: Insufficient documentation

## 2016-07-14 DIAGNOSIS — Y838 Other surgical procedures as the cause of abnormal reaction of the patient, or of later complication, without mention of misadventure at the time of the procedure: Secondary | ICD-10-CM | POA: Insufficient documentation

## 2016-07-14 DIAGNOSIS — Y92530 Ambulatory surgery center as the place of occurrence of the external cause: Secondary | ICD-10-CM | POA: Insufficient documentation

## 2016-07-14 DIAGNOSIS — I10 Essential (primary) hypertension: Secondary | ICD-10-CM | POA: Diagnosis not present

## 2016-07-14 DIAGNOSIS — Z79899 Other long term (current) drug therapy: Secondary | ICD-10-CM | POA: Insufficient documentation

## 2016-07-14 HISTORY — DX: Delusional disorders: F22

## 2016-07-14 HISTORY — DX: Dyspnea, unspecified: R06.00

## 2016-07-14 HISTORY — DX: Personal history of other diseases of the digestive system: Z87.19

## 2016-07-14 HISTORY — DX: Tremor, unspecified: R25.1

## 2016-07-14 HISTORY — DX: Unspecified osteoarthritis, unspecified site: M19.90

## 2016-07-14 HISTORY — DX: Pain, unspecified: R52

## 2016-07-14 HISTORY — DX: Restless legs syndrome: G25.81

## 2016-07-14 HISTORY — PX: CATARACT EXTRACTION W/PHACO: SHX586

## 2016-07-14 SURGERY — PHACOEMULSIFICATION, CATARACT, WITH IOL INSERTION
Anesthesia: Monitor Anesthesia Care | Site: Eye | Laterality: Right | Wound class: Clean

## 2016-07-14 MED ORDER — NA CHONDROIT SULF-NA HYALURON 40-17 MG/ML IO SOLN
INTRAOCULAR | Status: AC
  Filled 2016-07-14: qty 1

## 2016-07-14 MED ORDER — NA CHONDROIT SULF-NA HYALURON 40-17 MG/ML IO SOLN
INTRAOCULAR | Status: DC | PRN
  Administered 2016-07-14: 1 mL via INTRAOCULAR

## 2016-07-14 MED ORDER — LIDOCAINE HCL (PF) 4 % IJ SOLN
INTRAMUSCULAR | Status: DC | PRN
  Administered 2016-07-14: 4 mL via OPHTHALMIC

## 2016-07-14 MED ORDER — EPINEPHRINE PF 1 MG/ML IJ SOLN
INTRAMUSCULAR | Status: AC
  Filled 2016-07-14: qty 1

## 2016-07-14 MED ORDER — MIDAZOLAM HCL 2 MG/2ML IJ SOLN
INTRAMUSCULAR | Status: AC
  Filled 2016-07-14: qty 2

## 2016-07-14 MED ORDER — PHENYLEPHRINE HCL 10 % OP SOLN
OPHTHALMIC | Status: AC
  Administered 2016-07-14: 1 [drp] via OPHTHALMIC
  Filled 2016-07-14: qty 5

## 2016-07-14 MED ORDER — CARBACHOL 0.01 % IO SOLN
INTRAOCULAR | Status: DC | PRN
  Administered 2016-07-14: .5 mL via INTRAOCULAR

## 2016-07-14 MED ORDER — ALFENTANIL 500 MCG/ML IJ INJ
INJECTION | INTRAMUSCULAR | Status: DC | PRN
Start: 2016-07-14 — End: 2016-07-14
  Administered 2016-07-14 (×2): 125 ug via INTRAVENOUS
  Administered 2016-07-14 (×3): 250 ug via INTRAVENOUS

## 2016-07-14 MED ORDER — HYALURONIDASE HUMAN 150 UNIT/ML IJ SOLN
INTRAMUSCULAR | Status: AC
  Filled 2016-07-14: qty 1

## 2016-07-14 MED ORDER — MIDAZOLAM HCL 2 MG/2ML IJ SOLN
INTRAMUSCULAR | Status: DC | PRN
  Administered 2016-07-14 (×2): 0.5 mg via INTRAVENOUS
  Administered 2016-07-14: 1 mg via INTRAVENOUS

## 2016-07-14 MED ORDER — LIDOCAINE HCL (PF) 4 % IJ SOLN
INTRAMUSCULAR | Status: AC
  Filled 2016-07-14: qty 5

## 2016-07-14 MED ORDER — LIDOCAINE HCL (PF) 4 % IJ SOLN
INTRAMUSCULAR | Status: DC | PRN
  Administered 2016-07-14: 2.25 mL via OPHTHALMIC

## 2016-07-14 MED ORDER — POVIDONE-IODINE 5 % OP SOLN
OPHTHALMIC | Status: AC
  Filled 2016-07-14: qty 30

## 2016-07-14 MED ORDER — SODIUM CHLORIDE 0.9 % IV SOLN
INTRAVENOUS | Status: DC
  Administered 2016-07-14: 10:00:00 via INTRAVENOUS

## 2016-07-14 MED ORDER — FENTANYL CITRATE (PF) 100 MCG/2ML IJ SOLN: 25.0000 ug | INTRAMUSCULAR | Status: DC | PRN

## 2016-07-14 MED ORDER — TETRACAINE HCL 0.5 % OP SOLN
OPHTHALMIC | Status: AC
  Filled 2016-07-14: qty 2

## 2016-07-14 MED ORDER — CYCLOPENTOLATE HCL 2 % OP SOLN
OPHTHALMIC | Status: AC
  Administered 2016-07-14: 1 [drp] via OPHTHALMIC
  Filled 2016-07-14: qty 2

## 2016-07-14 MED ORDER — CEFUROXIME OPHTHALMIC INJECTION 1 MG/0.1 ML
INJECTION | OPHTHALMIC | Status: AC
  Filled 2016-07-14: qty 0.1

## 2016-07-14 MED ORDER — ONDANSETRON HCL 4 MG/2ML IJ SOLN: 4.0000 mg | Freq: Once | INTRAMUSCULAR | Status: DC | PRN

## 2016-07-14 MED ORDER — TETRACAINE HCL 0.5 % OP SOLN
OPHTHALMIC | Status: DC | PRN
  Administered 2016-07-14: 1 [drp] via OPHTHALMIC

## 2016-07-14 MED ORDER — MOXIFLOXACIN HCL 0.5 % OP SOLN
OPHTHALMIC | Status: DC | PRN
  Administered 2016-07-14: .2 mL via OPHTHALMIC

## 2016-07-14 MED ORDER — ACETAZOLAMIDE SODIUM 500 MG IJ SOLR
INTRAMUSCULAR | Status: DC | PRN
  Administered 2016-07-14: 250 mg via INTRAVENOUS

## 2016-07-14 MED ORDER — PHENYLEPHRINE HCL 10 % OP SOLN
1.0000 [drp] | OPHTHALMIC | Status: AC
  Administered 2016-07-14 (×4): 1 [drp] via OPHTHALMIC

## 2016-07-14 MED ORDER — CYCLOPENTOLATE HCL 2 % OP SOLN
1.0000 [drp] | OPHTHALMIC | Status: AC
  Administered 2016-07-14 (×4): 1 [drp] via OPHTHALMIC

## 2016-07-14 MED ORDER — EPINEPHRINE PF 1 MG/ML IJ SOLN
INTRAOCULAR | Status: DC | PRN
  Administered 2016-07-14: 1 mL via OPHTHALMIC

## 2016-07-14 MED ORDER — ACETAZOLAMIDE SODIUM 500 MG IJ SOLR
INTRAMUSCULAR | Status: AC
  Filled 2016-07-14: qty 500

## 2016-07-14 MED ORDER — MOXIFLOXACIN HCL 0.5 % OP SOLN
OPHTHALMIC | Status: AC
  Filled 2016-07-14: qty 3

## 2016-07-14 MED ORDER — MOXIFLOXACIN HCL 0.5 % OP SOLN
1.0000 [drp] | OPHTHALMIC | Status: AC
  Administered 2016-07-14 (×3): 1 [drp] via OPHTHALMIC

## 2016-07-14 MED ORDER — ACETYLCHOLINE CHLORIDE 20 MG IO SOLR
INTRAOCULAR | Status: AC
  Filled 2016-07-14: qty 1

## 2016-07-14 MED ORDER — POVIDONE-IODINE 5 % OP SOLN
OPHTHALMIC | Status: DC | PRN
  Administered 2016-07-14: 1 via OPHTHALMIC

## 2016-07-14 MED ORDER — MOXIFLOXACIN HCL 0.5 % OP SOLN
OPHTHALMIC | Status: AC
  Administered 2016-07-14: 1 [drp] via OPHTHALMIC
  Filled 2016-07-14: qty 3

## 2016-07-14 MED ORDER — BUPIVACAINE HCL (PF) 0.75 % IJ SOLN
INTRAMUSCULAR | Status: AC
  Filled 2016-07-14: qty 10

## 2016-07-14 SURGICAL SUPPLY — 33 items
CANNULA ANT/CHMB 27GA (MISCELLANEOUS) ×3 IMPLANT
CORD BIP STRL DISP 12FT (MISCELLANEOUS) ×3 IMPLANT
CUP MEDICINE 2OZ PLAST GRAD ST (MISCELLANEOUS) ×3 IMPLANT
DRAPE XRAY CASSETTE 23X24 (DRAPES) ×3 IMPLANT
ERASER HMR WETFIELD 18G (MISCELLANEOUS) ×3 IMPLANT
GLOVE BIO SURGEON STRL SZ8 (GLOVE) ×3 IMPLANT
GLOVE SURG LX 6.5 MICRO (GLOVE) ×2
GLOVE SURG LX 8.0 MICRO (GLOVE) ×2
GLOVE SURG LX STRL 6.5 MICRO (GLOVE) ×1 IMPLANT
GLOVE SURG LX STRL 8.0 MICRO (GLOVE) ×1 IMPLANT
GOWN STRL REUS W/ TWL LRG LVL3 (GOWN DISPOSABLE) ×1 IMPLANT
GOWN STRL REUS W/ TWL XL LVL3 (GOWN DISPOSABLE) ×1 IMPLANT
GOWN STRL REUS W/TWL LRG LVL3 (GOWN DISPOSABLE) ×2
GOWN STRL REUS W/TWL XL LVL3 (GOWN DISPOSABLE) ×2
LENS IOL ACRSF IQ ULTRA 18.5 (Intraocular Lens) IMPLANT
LENS IOL ACRYSOF IQ 18.5 (Intraocular Lens) IMPLANT
LENS IOL DIOP 15.0 (Intraocular Lens) ×3 IMPLANT
PACK CATARACT (MISCELLANEOUS) ×3 IMPLANT
PACK CATARACT DINGLEDEIN LX (MISCELLANEOUS) ×3 IMPLANT
PACK EYE AFTER SURG (MISCELLANEOUS) ×3 IMPLANT
PACK VIT ANT 23G (MISCELLANEOUS) ×3 IMPLANT
SHLD EYE VISITEC  UNIV (MISCELLANEOUS) ×3 IMPLANT
SOL BSS BAG (MISCELLANEOUS) ×3
SOL PREP PVP 2OZ (MISCELLANEOUS) ×3
SOLUTION BSS BAG (MISCELLANEOUS) ×1 IMPLANT
SOLUTION PREP PVP 2OZ (MISCELLANEOUS) ×1 IMPLANT
SUT ETHILON 10 0 CS140 6 (SUTURE) ×3 IMPLANT
SUT SILK 5-0 (SUTURE) ×3 IMPLANT
SYR 3ML LL SCALE MARK (SYRINGE) ×3 IMPLANT
SYR 5ML LL (SYRINGE) ×3 IMPLANT
SYR TB 1ML 27GX1/2 LL (SYRINGE) ×3 IMPLANT
WATER STERILE IRR 250ML POUR (IV SOLUTION) ×3 IMPLANT
WIPE NON LINTING 3.25X3.25 (MISCELLANEOUS) ×3 IMPLANT

## 2016-07-14 NOTE — Discharge Instructions (Signed)
Eye Surgery Discharge Instructions  Expect mild scratchy sensation or mild soreness. DO NOT RUB YOUR EYE!  The day of surgery:  Minimal physical activity, but bed rest is not required  No reading, computer work, or close hand work  No bending, lifting, or straining.  May watch TV  For 24 hours:  No driving, legal decisions, or alcoholic beverages  Safety precautions  Eat anything you prefer: It is better to start with liquids, then soup then solid foods.  _____ Eye patch should be worn until postoperative exam tomorrow.  ____ Solar shield eyeglasses should be worn for comfort in the sunlight/patch while sleeping  Resume all regular medications including aspirin or Coumadin if these were discontinued prior to surgery. You may shower, bathe, shave, or wash your hair. Tylenol may be taken for mild discomfort.  Call your doctor if you experience significant pain, nausea, or vomiting, fever > 101 or other signs of infection. [phone] or [phone] Specific instructions:  Follow-up Information    PORFILIO,WILLIAM LOUIS, MD Follow up.   Specialty:  Ophthalmology Why:  February 8 at 9:15am Contact information: [address] [place] [address] [phone]

## 2016-07-14 NOTE — Anesthesia Preprocedure Evaluation (Signed)
Anesthesia Evaluation  Patient identified by MRN, date of birth, ID band Patient awake    Reviewed: Allergy & Precautions, H&P , NPO status , Patient's Chart, lab work & pertinent test results, reviewed documented beta blocker date and time   Airway Mallampati: II   Neck ROM: full    Dental  (+) Poor Dentition, Teeth Intact   Pulmonary neg pulmonary ROS, shortness of breath, asthma , COPD, former smoker,    Pulmonary exam normal        Cardiovascular hypertension, negative cardio ROS Normal cardiovascular exam Rhythm:regular Rate:Normal     Neuro/Psych  Headaches, PSYCHIATRIC DISORDERS negative neurological ROS  negative psych ROS   GI/Hepatic negative GI ROS, Neg liver ROS, hiatal hernia, GERD  Medicated,  Endo/Other  negative endocrine ROS  Renal/GU negative Renal ROS  negative genitourinary   Musculoskeletal   Abdominal   Peds  Hematology negative hematology ROS (+)   Anesthesia Other Findings Past Medical History: No date: Anxiety No date: Arthritis No date: COPD (chronic obstructive pulmonary disease) (* No date: Depression No date: Dyspnea     Comment: DOE No date: GERD (gastroesophageal reflux disease) No date: History of hiatal hernia No date: Hypertension No date: Pain     Comment: CHRONIC KNEE No date: Paranoid disorder (HCC) No date: RLS (restless legs syndrome) No date: Tremors of nervous system     Comment: HANDS Past Surgical History: 1988: ABDOMINAL HYSTERECTOMY 1988: APPENDECTOMY No date: CARDIAC CATHETERIZATION 11/11/2014: ESOPHAGOGASTRODUODENOSCOPY N/A     Comment: Procedure: ESOPHAGOGASTRODUODENOSCOPY (EGD);                Surgeon: Josefine Class, MD;  Location:               [facility] ENDOSCOPY;  Service: Endoscopy;                Laterality: N/A; No date: TONSILLECTOMY AND ADENOIDECTOMY 2004: TRIGGER FINGER RELEASE BMI    Body Mass Index:  42.51 kg/m      Reproductive/Obstetrics negative OB ROS                             Anesthesia Physical Anesthesia Plan  ASA: IV  Anesthesia Plan: General   Post-op Pain Management:    Induction:   Airway Management Planned:   Additional Equipment:   Intra-op Plan:   Post-operative Plan:   Informed Consent: I have reviewed the patients History and Physical, chart, labs and discussed the procedure including the risks, benefits and alternatives for the proposed anesthesia with the patient or authorized representative who has indicated his/her understanding and acceptance.   Dental Advisory Given  Plan Discussed with: CRNA  Anesthesia Plan Comments:         Anesthesia Quick Evaluation

## 2016-07-14 NOTE — Anesthesia Postprocedure Evaluation (Signed)
Anesthesia Post Note  Patient: Vicki Morales  Procedure(s) Performed: Procedure(s) (LRB): CATARACT EXTRACTION PHACO AND INTRAOCULAR LENS PLACEMENT (IOC) anterior vitrectomy (Right)  Patient location during evaluation: PACU Anesthesia Type: MAC Level of consciousness: awake and alert Pain management: pain level controlled Vital Signs Assessment: post-procedure vital signs reviewed and stable Respiratory status: spontaneous breathing and nonlabored ventilation Cardiovascular status: blood pressure returned to baseline and stable Postop Assessment: no signs of nausea or vomiting Anesthetic complications: no     Last Vitals:  Vitals:   07/14/16 1149 07/14/16 1200  BP: (!) 144/65 (!) 144/65  Pulse: 66 70  Resp: 16   Temp: 36.4 C     Last Pain:  Vitals:   07/14/16 1200  TempSrc: Temporal  PainSc: 0-No pain                 Darlyne Russian

## 2016-07-14 NOTE — Interval H&P Note (Signed)
History and Physical Interval Note:  07/14/2016 10:35 AM  Vicki Morales  has presented today for surgery, with the diagnosis of cataract  The various methods of treatment have been discussed with the patient and family. After consideration of risks, benefits and other options for treatment, the patient has consented to  Procedure(s): CATARACT EXTRACTION PHACO AND INTRAOCULAR LENS PLACEMENT ([place]) (Right) as a surgical intervention .  The patient's history has been reviewed, patient examined, no change in status, stable for surgery.  I have reviewed the patient's chart and labs.  Questions were answered to the patient's satisfaction.     [name]

## 2016-07-14 NOTE — Anesthesia Post-op Follow-up Note (Cosign Needed)
Anesthesia QCDR form completed.        

## 2016-07-14 NOTE — Transfer of Care (Signed)
Immediate Anesthesia Transfer of Care Note  Patient: Vicki Morales  Procedure(s) Performed: Procedure(s) with comments: CATARACT EXTRACTION PHACO AND INTRAOCULAR LENS PLACEMENT ([place]) anterior vitrectomy (Right) - [place] 02:40 AP% 24.4 CDE 59.98 Fluid pack # [id number]  Patient Location: PACU  Anesthesia Type:MAC  Level of Consciousness: awake, alert  and oriented  Airway & Oxygen Therapy: Patient Spontanous Breathing  Post-op Assessment: Report given to RN and Post -op Vital signs reviewed and stable  Post vital signs: Reviewed and stable  Last Vitals:  Vitals:   07/14/16 1149 07/14/16 1200  BP: (!) 144/65 (!) 144/65  Pulse: 66 70  Resp: 16   Temp: 36.4 C     Last Pain:  Vitals:   07/14/16 1200  TempSrc: Temporal  PainSc: 0-No pain         Complications: No apparent anesthesia complications

## 2016-07-14 NOTE — Op Note (Signed)
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 Item Serial No. Manufacturer Lot No. LRB No. Used  LENS IOL ACRYSOF IQ 18.5 - [id number] Intraocular Lens LENS IOL ACRYSOF IQ 18.5 [id number] ALCON  Right 1  LENS IOL DIOP 15.0 - [id number] Intraocular Lens LENS IOL DIOP 15.0 [id number] ALCON   Right 1   Anesthesia: 2% Lidocaine and 4% Marcaine in a 50/50 mixture with 10 unites/ml of Hylenex given as a peribulbar Anesthesiologist: Anesthesiologist: Molli Barrows, MD CRNA: Darlyne Russian, CRNA Complications: capsular tear and core vitrectomy Estimated Blood Loss: less than 1 ml  Description of procedure:  The patient was given anesthesia and sedation via intravenous access. The patient was then prepped and draped in the usual fashion. A 25-gauge needle was bent for initiating the capsulorhexis. A 5-0 silk suture was placed through the conjunctiva superior and inferiorly to serve as bridle sutures. Hemostasis was obtained at the superior limbus using an eraser cautery. A partial thickness groove was made at the anterior surgical limbus with a 64 Beaver blade and this was dissected anteriorly with an [employer]aya. The anterior chamber was entered at 10 o'clock with a 1.0 mm paracentesis knife and through the lamellar dissection with a 2.6 mm Alcon keratome. Epi-Shugarcaine 0.5 CC [9 cc BSS Plus (Alcon), 3 cc 4% preservative-free lidocaine ([place]) and 4 cc 1:1000 preservative-free, bisulfite-free epinephrine] was injected into the anterior chamber via the paracentesis tract. Epi-Shugarcaine 0.5 CC [9 cc BSS Plus (Alcon), 3 cc 4% preservative-free lidocaine (Hospira) and  4 cc 1:1000 preservative-free, bisulfite-free epinephrine] was injected into the anterior chamber via the paracentesis tract. DiscoVisc was injected to replace the aqueous and a continuous tear curvilinear capsulorhexis was performed using a bent 25-gauge needle.  Balance salt on a syringe was used to perform hydro-dissection and phacoemulsification was carried out using a divide and conquer technique. Procedure(s) with comments: CATARACT EXTRACTION PHACO AND INTRAOCULAR LENS PLACEMENT (IOC) anterior vitrectomy (Right) - [place] 02:40 AP% 24.4 CDE 59.98 Fluid pack # [id number].  While removing the nucleus a tear occurred in the inferior capsule. The nuclear material was completely removed. Irrigation/aspiration was used to remove the residual cortex. There was not enough capsular support to place an lens in the sulcus so the decision was made to place an anterior chamber lens. the paracentesis track was enlarged with the paracentesis knife and the wont was enlarged with the keratome. Miochol was placed in the anterior chamber to make the pupil miotic. A Sheets glide was placed across the chamber and DiscoVisc was placed on topof the Sheets glide. The anterior chamber lens was placed across the chamber and the Sheets glide was withdrawn. A Sinskey hook was used to push the superior haptic into the eye and into the superior sulcus. The vitrector was used to perform a peripheral iridectomy temporally. There was a small amount of bleeding while repositioning the IOL and the I/A handpiece was used to removed the blood and the residual DiscoVisc. There was no vitreous coming through the pupil or the wound at the end of the case. Four 10-0 nylon sutures were placed across the wound and one across the enlarged paracentesis. The sutures were rotated superior and the knots buried where possible. The would was checked for  leaks and none were found. 0.1 ml of cefuroxime containing 1 mg of drug  was injected via the paracentesis  track. The wound was checked for leaks again and none were found.   The bridal sutures were removed and two drops of Vigamox were placed on the eye. An eye shield was placed to protect the eye and the patient was discharged to the recovery area in good condition. 250 mg of Diamox were given IV at the end of the case.    Maikel Neisler MD

## 2016-07-20 ENCOUNTER — Ambulatory Visit (INDEPENDENT_AMBULATORY_CARE_PROVIDER_SITE_OTHER): Payer: Medicare HMO | Admitting: Family Medicine

## 2016-07-20 ENCOUNTER — Ambulatory Visit: Payer: No Typology Code available for payment source | Admitting: Psychiatry

## 2016-07-20 ENCOUNTER — Encounter: Payer: Self-pay | Admitting: Family Medicine

## 2016-07-20 ENCOUNTER — Encounter: Payer: Self-pay | Admitting: Psychiatry

## 2016-07-20 VITALS — BP 130/60 | HR 82 | Temp 98.0°F | Resp 16 | Ht 63.0 in | Wt 242.0 lb

## 2016-07-20 VITALS — BP 148/80 | HR 71

## 2016-07-20 DIAGNOSIS — M7551 Bursitis of right shoulder: Secondary | ICD-10-CM

## 2016-07-20 DIAGNOSIS — N3000 Acute cystitis without hematuria: Secondary | ICD-10-CM

## 2016-07-20 DIAGNOSIS — F209 Schizophrenia, unspecified: Secondary | ICD-10-CM

## 2016-07-20 LAB — POCT URINALYSIS DIPSTICK
BILIRUBIN UA: NEGATIVE
GLUCOSE UA: NEGATIVE
Ketones, UA: NEGATIVE
Nitrite, UA: NEGATIVE
Protein, UA: NEGATIVE
RBC UA: NEGATIVE
Urobilinogen, UA: 0.2
pH, UA: 7.5

## 2016-07-20 MED ORDER — PREDNISONE 10 MG PO TABS: ORAL_TABLET | ORAL | 0 refills | Status: AC

## 2016-07-20 MED ORDER — SERTRALINE HCL 100 MG PO TABS: 100.0000 mg | ORAL_TABLET | Freq: Every day | ORAL | 2 refills | Status: DC

## 2016-07-20 MED ORDER — TRIFLUOPERAZINE HCL 10 MG PO TABS: 10.0000 mg | ORAL_TABLET | Freq: Every day | ORAL | 2 refills | Status: DC

## 2016-07-20 MED ORDER — CIPROFLOXACIN HCL 500 MG PO TABS: 500.0000 mg | ORAL_TABLET | Freq: Two times a day (BID) | ORAL | 0 refills | Status: AC

## 2016-07-20 NOTE — Progress Notes (Signed)
Patient ID: Vicki Morales, female   DOB: [birth year], 76 y.o.   MRN: [id number]   [facility] MD/PA/NP OP Progress Note  07/20/2016 1:59 PM Vicki Morales  MRN:  [id number]  Subjective: . Patient was seen today with her daughter. She reports doing well and states that her mood has been quite stable. She is been sleeping well and eating well. States states that she is looking forward to a trip with her family and of March on a cruise. Denies any problems with mood. He said regimen of medications appears to be doing well for patient. Denies any suicidal thoughts and presents with a very pleasant affect.  Chief Complaint: none Chief Complaint    Follow-up; Medication Refill     Visit Diagnosis:     ICD-9-CM ICD-10-CM   1. Schizophrenia, unspecified type ([place]) 295.90 F20.9     Past Medical History:  Past Medical History:  Diagnosis Date  . Anxiety   . Arthritis   . COPD (chronic obstructive pulmonary disease) ([place])   . Depression   . Dyspnea    DOE  . GERD (gastroesophageal reflux disease)   . History of hiatal hernia   . Hypertension   . Pain    CHRONIC KNEE  . Paranoid disorder ([place])   . RLS (restless legs syndrome)   . Tremors of nervous system    HANDS    Past Surgical History:  Procedure Laterality Date  . ABDOMINAL HYSTERECTOMY  1988  . APPENDECTOMY  1988  . CARDIAC CATHETERIZATION    . CATARACT EXTRACTION W/PHACO Right 07/14/2016   Procedure: CATARACT EXTRACTION PHACO AND INTRAOCULAR LENS PLACEMENT ([place]) anterior vitrectomy;  Surgeon: Estill Cotta, MD;  Location: [facility] ORS;  Service: Ophthalmology;  Laterality: Right;  [place] 02:40 AP% 24.4 CDE 59.98 Fluid pack # [id number]  . ESOPHAGOGASTRODUODENOSCOPY N/A 11/11/2014   Procedure: ESOPHAGOGASTRODUODENOSCOPY (EGD);  Surgeon: Josefine Class, MD;  Location: [facility] ENDOSCOPY;  Service: Endoscopy;  Laterality: N/A;  . TONSILLECTOMY AND ADENOIDECTOMY    . TRIGGER FINGER RELEASE  2004   Family History:  Family History  Problem  Relation Age of Onset  . Breast cancer Sister   . Stroke Father   . Emphysema Father   . Anxiety disorder Father   . Depression Father   . Anxiety disorder Brother    Social History:  Social History   Social History  . Marital status: Widowed    Spouse name: N/A  . Number of children: N/A  . Years of education: N/A   Social History Main Topics  . Smoking status: Former Smoker    Years: 30.00  . Smokeless tobacco: Never Used     Comment: quit in 1990's  . Alcohol use No  . Drug use: No  . Sexual activity: Not Currently   Other Topics Concern  . None   Social History Narrative  . None   Additional History:   Assessment:   Musculoskeletal: Strength & Muscle Tone: within normal limits Gait & Station: Slow and walks with a cane Patient leans: N/A  Psychiatric Specialty Exam: Medication Refill     Review of Systems  Psychiatric/Behavioral: Negative for depression, hallucinations, memory loss, substance abuse and suicidal ideas. The patient is not nervous/anxious and does not have insomnia.   All other systems reviewed and are negative.   Blood pressure (!) 148/80, pulse 71.There is no height or weight on file to calculate BMI.  General Appearance: Well Groomed  Eye Contact:  Good  Speech:  Normal Rate  Volume:  Normal  Mood:  good  Affect:  Congruent Bright, smiling   Thought Process:  Linear and Logical  Orientation:  Full (Time, Place, and Person)  Thought Content:  Negative  Suicidal Thoughts:  No  Homicidal Thoughts:  No  Memory:  Immediate;   Good Recent;   Good Remote;   Good  Judgement:  Good  Insight:  Good  Psychomotor Activity:  Normal  Concentration:  Good  Recall:  Good  Fund of Knowledge: Good  Language: Good  Akathisia:  Negative  Handed:    AIMS (if indicated):  Mild cogwheeling noted on right arm   Assets:  Communication Skills Desire for Improvement  ADL's:  Intact  Cognition: WNL  Sleep:  fair   Is the patient at risk to  self?  No. Has the patient been a risk to self in the past 6 months?  No. Has the patient been a risk to self within the distant past?  Yes.  OD in 1979 Is the patient a risk to others?  No. Has the patient been a risk to others in the past 6 months?  No. Has the patient been a risk to others within the distant past?  No.  Current Medications: Current Outpatient Prescriptions  Medication Sig Dispense Refill  . albuterol (PROVENTIL) (2.5 MG/3ML) 0.083% nebulizer solution Take 2.5 mg by nebulization 2 (two) times daily as needed for wheezing or shortness of breath.    [name] Kitchen amLODipine (NORVASC) 10 MG tablet TAKE 1 TABLET(10 MG) BY MOUTH DAILY 30 tablet 12  . Ascorbic Acid (VITAMIN C) 1000 MG tablet Take 2,000 mg by mouth daily.    [name] Kitchen aspirin EC 325 MG tablet Take 1 tablet (325 mg total) by mouth daily. (Patient taking differently: Take 325 mg by mouth at bedtime. ) 30 tablet 2  . benazepril (LOTENSIN) 5 MG tablet Take 1 tablet (5 mg total) by mouth daily. 30 tablet 5  . Calcium Carbonate-Vitamin D (CALCIUM 600+D) 600-400 MG-UNIT tablet Take 2 tablets by mouth 2 (two) times daily.     . fluticasone (FLONASE) 50 MCG/ACT nasal spray Place 1 spray into both nostrils daily.    . Fluticasone-Salmeterol (ADVAIR) 250-50 MCG/DOSE AEPB Inhale 1 puff into the lungs daily.     [name] Kitchen gabapentin (NEURONTIN) 100 MG capsule Take 1 capsule (100 mg total) by mouth 2 (two) times daily. 60 capsule 11  . gabapentin (NEURONTIN) 300 MG capsule Take 2 capsules (600 mg total) by mouth at bedtime. 60 capsule 11  . loratadine (CLARITIN) 10 MG tablet Take 10 mg by mouth daily.    . magnesium oxide (MAG-OX) 400 MG tablet Take 400 mg by mouth 2 (two) times daily.    . Misc Natural Products (OSTEO BI-FLEX ADV DOUBLE ST PO) Take 2 tablets by mouth daily.    . montelukast (SINGULAIR) 10 MG tablet TAKE 1 TABLET BY MOUTH EVERY DAY 30 tablet 12  . Multiple Vitamin (MULTIVITAMIN WITH MINERALS) TABS tablet Take 1 tablet by mouth daily.     [name] Kitchen omeprazole (PRILOSEC) 20 MG capsule Take 20 mg by mouth daily.    [name] Kitchen oxybutynin (DITROPAN-XL) 10 MG 24 hr tablet TAKE 1 TABLET BY MOUTH AT BEDTIME 30 tablet 5  . Probiotic Product (ALIGN) 4 MG CAPS Take 1 capsule by mouth daily.    . ranitidine (ZANTAC) 150 MG tablet Take 150 mg by mouth at bedtime.    . sertraline (ZOLOFT) 100 MG tablet Take 1 tablet (100 mg total) by mouth at  bedtime. 30 tablet 2  . theophylline (UNIPHYL) 400 MG 24 hr tablet Take 400 mg by mouth daily.    . traMADol (ULTRAM) 50 MG tablet TAKE 1 TABLET BY MOUTH EVERY 8 HOURS AS NEEDED 60 tablet 5  . trifluoperazine (STELAZINE) 10 MG tablet Take 1 tablet (10 mg total) by mouth at bedtime. 30 tablet 2   No current facility-administered medications for this visit.     Medical Decision Making:  Established Problem, Stable/Improving (1) and Review of Medication Regimen & Side Effects (2)  Treatment Plan Summary:Medication management and Plan Plan Schizophrenia-  Continue sertraline at 100 mg daily  Continue gabapentin 600mg  at bedtime, continue with 100mg  po bid   continue Stelazine at 10  mg once daily at bedtime  Discontinue the Benadryl  She will follow-up in 3 months or call before if needed  Cormac Wint 07/20/2016, 1:59 PM

## 2016-07-20 NOTE — Patient Instructions (Signed)
   Call for referral to orthopedist if your shoulder is not feeling much better next week.

## 2016-07-20 NOTE — Progress Notes (Signed)
Patient: Vicki Morales Female    DOB: 1941-06-05   76 y.o.   MRN: [id number] Visit Date: 07/20/2016  Today's Provider: Lelon Huh, MD   Chief Complaint  Patient presents with  . Shoulder Pain   Subjective:    Patient has had right shoulder pain for several months. Patient states pain has gradually worsened. Pain is radiating down right arm.   Shoulder Pain   The pain is present in the right shoulder, right arm and right elbow. This is a new problem. The current episode started more than 1 month ago. There has been no history of extremity trauma. The problem occurs constantly. The pain is at a severity of 8/10. The pain is moderate. Pertinent negatives include no fever, inability to bear weight, itching, joint locking, joint swelling, limited range of motion, numbness, stiffness or tingling. The symptoms are aggravated by standing and lying down. She has tried heat, cold and NSAIDS for the symptoms. The treatment provided mild relief.     Allergies  Allergen Reactions  . Arthrotec [Diclofenac-Misoprostol] Hives  . Codeine Other (See Comments)    Reaction:  Headache   . Diclofenac     Pt unsure allergy  . Hydrochlorothiazide Other (See Comments)    Reaction:  Weakness   . Morphine Hives  . Penicillins Other (See Comments)    Doesn't work Has patient had a PCN reaction causing immediate rash, facial/tongue/throat swelling, SOB or lightheadedness with hypotension: No Has patient had a PCN reaction causing severe rash involving mucus membranes or skin necrosis: No Has patient had a PCN reaction that required hospitalization No Has patient had a PCN reaction occurring within the last 10 years: No If all of the above answers are "NO", then may proceed with Cephalosporin use.  . Sulfa Antibiotics     Doesn't work  . Tiotropium Bromide Monohydrate Other (See Comments)    Reaction:  Blurry vision   . Tylenol [Acetaminophen]     Doesn't work  . Pantoprazole Rash      Current Outpatient Prescriptions:  .  albuterol (PROVENTIL) (2.5 MG/3ML) 0.083% nebulizer solution, Take 2.5 mg by nebulization 2 (two) times daily as needed for wheezing or shortness of breath., Disp: , Rfl:  .  amLODipine (NORVASC) 10 MG tablet, TAKE 1 TABLET(10 MG) BY MOUTH DAILY, Disp: 30 tablet, Rfl: 12 .  Ascorbic Acid (VITAMIN C) 1000 MG tablet, Take 2,000 mg by mouth daily., Disp: , Rfl:  .  aspirin EC 325 MG tablet, Take 1 tablet (325 mg total) by mouth daily. (Patient taking differently: Take 325 mg by mouth at bedtime. ), Disp: 30 tablet, Rfl: 2 .  benazepril (LOTENSIN) 5 MG tablet, Take 1 tablet (5 mg total) by mouth daily., Disp: 30 tablet, Rfl: 5 .  Calcium Carbonate-Vitamin D (CALCIUM 600+D) 600-400 MG-UNIT tablet, Take 2 tablets by mouth 2 (two) times daily. , Disp: , Rfl:  .  fluticasone (FLONASE) 50 MCG/ACT nasal spray, Place 1 spray into both nostrils daily., Disp: , Rfl:  .  Fluticasone-Salmeterol (ADVAIR) 250-50 MCG/DOSE AEPB, Inhale 1 puff into the lungs daily. , Disp: , Rfl:  .  gabapentin (NEURONTIN) 100 MG capsule, Take 1 capsule (100 mg total) by mouth 2 (two) times daily., Disp: 60 capsule, Rfl: 11 .  gabapentin (NEURONTIN) 300 MG capsule, Take 2 capsules (600 mg total) by mouth at bedtime., Disp: 60 capsule, Rfl: 11 .  loratadine (CLARITIN) 10 MG tablet, Take 10 mg by mouth daily., Disp: ,  Rfl:  .  magnesium oxide (MAG-OX) 400 MG tablet, Take 400 mg by mouth 2 (two) times daily., Disp: , Rfl:  .  Misc Natural Products (OSTEO BI-FLEX ADV DOUBLE ST PO), Take 2 tablets by mouth daily., Disp: , Rfl:  .  montelukast (SINGULAIR) 10 MG tablet, TAKE 1 TABLET BY MOUTH EVERY DAY, Disp: 30 tablet, Rfl: 12 .  Multiple Vitamin (MULTIVITAMIN WITH MINERALS) TABS tablet, Take 1 tablet by mouth daily., Disp: , Rfl:  .  omeprazole (PRILOSEC) 20 MG capsule, Take 20 mg by mouth daily., Disp: , Rfl:  .  oxybutynin (DITROPAN-XL) 10 MG 24 hr tablet, TAKE 1 TABLET BY MOUTH AT BEDTIME,  Disp: 30 tablet, Rfl: 5 .  Probiotic Product (ALIGN) 4 MG CAPS, Take 1 capsule by mouth daily., Disp: , Rfl:  .  ranitidine (ZANTAC) 150 MG tablet, Take 150 mg by mouth at bedtime., Disp: , Rfl:  .  sertraline (ZOLOFT) 100 MG tablet, Take 1 tablet (100 mg total) by mouth at bedtime., Disp: 30 tablet, Rfl: 2 .  theophylline (UNIPHYL) 400 MG 24 hr tablet, Take 400 mg by mouth daily., Disp: , Rfl:  .  traMADol (ULTRAM) 50 MG tablet, TAKE 1 TABLET BY MOUTH EVERY 8 HOURS AS NEEDED, Disp: 60 tablet, Rfl: 5 .  trifluoperazine (STELAZINE) 10 MG tablet, Take 1 tablet (10 mg total) by mouth at bedtime., Disp: 30 tablet, Rfl: 2  Review of Systems  Constitutional: Negative for appetite change, chills, fatigue and fever.  Respiratory: Negative for chest tightness and shortness of breath.   Cardiovascular: Negative for chest pain and palpitations.  Gastrointestinal: Negative for abdominal pain, nausea and vomiting.  Musculoskeletal: Negative for stiffness.  Skin: Negative for itching.  Neurological: Negative for dizziness, tingling, weakness and numbness.    Social History  Substance Use Topics  . Smoking status: Former Smoker    Years: 30.00  . Smokeless tobacco: Never Used     Comment: quit in 1990's  . Alcohol use No   Objective:   BP 130/60 (BP Location: Right Arm, Patient Position: Sitting, Cuff Size: Large)   Pulse 82   Temp 98 F (36.7 C) (Oral)   Resp 16   Ht 5\' 3"  (1.6 m)   Wt 242 lb (109.8 kg)   SpO2 95%   BMI 42.87 kg/m   Physical Exam   General Appearance:    Alert, cooperative, no distress  Eyes:    PERRL, conjunctiva/corneas clear, EOM's intact       Lungs:     Clear to auscultation bilaterally, respirations unlabored  Heart:    Regular rate and rhythm  Neurologic:   Awake, alert, oriented x 3. No apparent focal neurological           defect.   MS:   Tender right superior shoulder. Pain with active abduction. Positive impingement sign.      Results for orders placed  or performed in visit on 07/20/16  POCT urinalysis dipstick  Result Value Ref Range   Color, UA Yellow    Clarity, UA Clear    Glucose, UA Neg    Bilirubin, UA Neg    Ketones, UA Neg    Spec Grav, UA <=1.005    Blood, UA Neg    pH, UA 7.5    Protein, UA Neg    Urobilinogen, UA 0.2    Nitrite, UA Neg    Leukocytes, UA moderate (2+) (A) Negative       Assessment & Plan:  1. Acute cystitis without hematuria  - POCT urinalysis dipstick - Urinalysis, Routine w reflex microscopic - ciprofloxacin (CIPRO) 500 MG tablet; Take 1 tablet (500 mg total) by mouth 2 (two) times daily.  Dispense: 14 tablet; Refill: 0  2. Subacromial bursitis of right shoulder joint Recommended referral to orthopedics for steroid injection which she refused. Will try course of oral steroid and passive ROM exercise. Advised to call back for orthopedic referral if not improving on prednisone.  - predniSONE (DELTASONE) 10 MG tablet; 6 tablets for 1 day, then 5 for 1 day, then 4 for 1 day, then 3 for 1 day, then 2 for 1 day then 1 for 1 day.  Dispense: 21 tablet; Refill: 0     The entirety of the information documented in the History of Present Illness, Review of Systems and Physical Exam were personally obtained by me. Portions of this information were initially documented by April M. Sabra Heck, CMA and reviewed by me for thoroughness and accuracy.    Lelon Huh, MD  [facility]

## 2016-07-21 LAB — SPECIMEN STATUS REPORT

## 2016-07-21 LAB — URINALYSIS, ROUTINE W REFLEX MICROSCOPIC

## 2016-07-23 ENCOUNTER — Telehealth: Payer: Self-pay | Admitting: Emergency Medicine

## 2016-07-23 DIAGNOSIS — M25512 Pain in left shoulder: Secondary | ICD-10-CM

## 2016-07-23 NOTE — Telephone Encounter (Signed)
[place] for ortho referral for shoulder pain. Thanks.

## 2016-07-23 NOTE — Telephone Encounter (Signed)
Dr. Caryn Section, is it ok to put in an order for orthopedic referral?

## 2016-07-23 NOTE — Telephone Encounter (Signed)
Pt called back stating that she thinks she need to go ahead and go to ortho for an injection. She would like to make sure the provider is in her network as well. Please advise. Thanks.

## 2016-07-26 NOTE — Telephone Encounter (Signed)
Pt called to make sure we were going to be able to refer her to ortho and hopes that she can get an appt this week. Thanks TNP

## 2016-07-26 NOTE — Telephone Encounter (Signed)
Please advise referral?  

## 2016-07-29 DIAGNOSIS — M7541 Impingement syndrome of right shoulder: Secondary | ICD-10-CM | POA: Diagnosis not present

## 2016-07-29 DIAGNOSIS — M7540 Impingement syndrome of unspecified shoulder: Secondary | ICD-10-CM | POA: Insufficient documentation

## 2016-08-08 ENCOUNTER — Other Ambulatory Visit: Payer: Self-pay | Admitting: Family Medicine

## 2016-08-17 ENCOUNTER — Other Ambulatory Visit: Payer: Self-pay | Admitting: Family Medicine

## 2016-08-20 ENCOUNTER — Ambulatory Visit (INDEPENDENT_AMBULATORY_CARE_PROVIDER_SITE_OTHER): Payer: Medicare HMO

## 2016-08-20 ENCOUNTER — Ambulatory Visit (INDEPENDENT_AMBULATORY_CARE_PROVIDER_SITE_OTHER): Payer: Medicare HMO | Admitting: Family Medicine

## 2016-08-20 VITALS — BP 128/64 | HR 80 | Temp 97.6°F | Ht 63.0 in | Wt 246.8 lb

## 2016-08-20 DIAGNOSIS — M1990 Unspecified osteoarthritis, unspecified site: Secondary | ICD-10-CM | POA: Diagnosis not present

## 2016-08-20 DIAGNOSIS — G2581 Restless legs syndrome: Secondary | ICD-10-CM | POA: Diagnosis not present

## 2016-08-20 DIAGNOSIS — E2839 Other primary ovarian failure: Secondary | ICD-10-CM | POA: Diagnosis not present

## 2016-08-20 DIAGNOSIS — G4734 Idiopathic sleep related nonobstructive alveolar hypoventilation: Secondary | ICD-10-CM | POA: Diagnosis not present

## 2016-08-20 DIAGNOSIS — I10 Essential (primary) hypertension: Secondary | ICD-10-CM | POA: Diagnosis not present

## 2016-08-20 DIAGNOSIS — Z0000 Encounter for general adult medical examination without abnormal findings: Secondary | ICD-10-CM | POA: Diagnosis not present

## 2016-08-20 DIAGNOSIS — I272 Pulmonary hypertension, unspecified: Secondary | ICD-10-CM

## 2016-08-20 DIAGNOSIS — E785 Hyperlipidemia, unspecified: Secondary | ICD-10-CM | POA: Diagnosis not present

## 2016-08-20 NOTE — Progress Notes (Signed)
Subjective:   Vicki Morales is a 76 y.o. female who presents for Medicare Annual (Subsequent) preventive examination.  Review of Systems:  N/A  Cardiac Risk Factors include: advanced age (>65men, >47 women);hypertension;dyslipidemia;obesity (BMI >30kg/m2)     Objective:     Vitals: BP 128/64 (BP Location: Right Arm)   Pulse 80   Temp 97.6 F (36.4 C) (Oral)   Ht 5\' 3"  (1.6 m)   Wt 246 lb 12.8 oz (111.9 kg)   BMI 43.72 kg/m   Body mass index is 43.72 kg/m.   Tobacco History  Smoking Status  . Former Smoker  . Years: 30.00  Smokeless Tobacco  . Never Used    Comment: quit in 1989     Counseling given: Not Answered   Past Medical History:  Diagnosis Date  . Anxiety   . Arthritis   . Cataract    right eye  . COPD (chronic obstructive pulmonary disease) ([place])   . Depression   . Dyspnea    DOE  . GERD (gastroesophageal reflux disease)   . History of hiatal hernia   . Hypertension   . Pain    CHRONIC KNEE  . Paranoid disorder ([place])   . RLS (restless legs syndrome)   . Tremors of nervous system    HANDS   Past Surgical History:  Procedure Laterality Date  . ABDOMINAL HYSTERECTOMY  1988  . APPENDECTOMY  1988  . CARDIAC CATHETERIZATION    . CATARACT EXTRACTION W/PHACO Right 07/14/2016   Procedure: CATARACT EXTRACTION PHACO AND INTRAOCULAR LENS PLACEMENT ([place]) anterior vitrectomy;  Surgeon: Estill Cotta, MD;  Location: [facility] ORS;  Service: Ophthalmology;  Laterality: Right;  [place] 02:40 AP% 24.4 CDE 59.98 Fluid pack # [id number]  . ESOPHAGOGASTRODUODENOSCOPY N/A 11/11/2014   Procedure: ESOPHAGOGASTRODUODENOSCOPY (EGD);  Surgeon: Josefine Class, MD;  Location: [facility] ENDOSCOPY;  Service: Endoscopy;  Laterality: N/A;  . TONSILLECTOMY AND ADENOIDECTOMY    . TRIGGER FINGER RELEASE  2004   Family History  Problem Relation Age of Onset  . Breast cancer Sister   . Stroke Father   . Emphysema Father   . Anxiety disorder Father   . Depression Father   .  Anxiety disorder Brother    History  Sexual Activity  . Sexual activity: Not Currently    Outpatient Encounter Prescriptions as of 08/20/2016  Medication Sig  . albuterol (PROVENTIL) (2.5 MG/3ML) 0.083% nebulizer solution Take 2.5 mg by nebulization 2 (two) times daily as needed for wheezing or shortness of breath.  [name] Kitchen amLODipine (NORVASC) 10 MG tablet TAKE 1 TABLET(10 MG) BY MOUTH DAILY  . Ascorbic Acid (VITAMIN C) 1000 MG tablet Take 2,000 mg by mouth daily.  [name] Kitchen aspirin EC 325 MG tablet Take 1 tablet (325 mg total) by mouth daily. (Patient taking differently: Take 325 mg by mouth at bedtime. )  . benazepril (LOTENSIN) 5 MG tablet Take 1 tablet (5 mg total) by mouth daily.  . Calcium Carbonate-Vitamin D (CALCIUM 600+D) 600-400 MG-UNIT tablet Take 2 tablets by mouth 2 (two) times daily.   . fluticasone (FLONASE) 50 MCG/ACT nasal spray Place 1 spray into both nostrils daily.  . Fluticasone-Salmeterol (ADVAIR) 250-50 MCG/DOSE AEPB Inhale 1 puff into the lungs daily.   [name] Kitchen gabapentin (NEURONTIN) 100 MG capsule Take 1 capsule (100 mg total) by mouth 2 (two) times daily.  [name] Kitchen gabapentin (NEURONTIN) 300 MG capsule Take 2 capsules (600 mg total) by mouth at bedtime.  [name] Kitchen loratadine (CLARITIN) 10 MG tablet Take 10 mg by  mouth daily.  . magnesium oxide (MAG-OX) 400 MG tablet Take 400 mg by mouth 2 (two) times daily.  . Misc Natural Products (OSTEO BI-FLEX ADV DOUBLE ST PO) Take 2 tablets by mouth daily.  . montelukast (SINGULAIR) 10 MG tablet TAKE 1 TABLET BY MOUTH EVERY DAY  . Multiple Vitamin (MULTIVITAMIN WITH MINERALS) TABS tablet Take 1 tablet by mouth daily.  . Omega-3 Fatty Acids (FISH OIL) 1000 MG CAPS Take by mouth.  [name] Kitchen omeprazole (PRILOSEC) 20 MG capsule Take 20 mg by mouth daily.  [name] Kitchen oxybutynin (DITROPAN-XL) 10 MG 24 hr tablet TAKE 1 TABLET BY MOUTH AT BEDTIME  . Probiotic Product (ALIGN) 4 MG CAPS Take 1 capsule by mouth daily.  . ranitidine (ZANTAC) 150 MG tablet Take 150 mg by mouth at bedtime.   . sertraline (ZOLOFT) 100 MG tablet Take 1 tablet (100 mg total) by mouth at bedtime.  . traMADol (ULTRAM) 50 MG tablet TAKE 1 TABLET BY MOUTH EVERY 8 HOURS AS NEEDED  . trifluoperazine (STELAZINE) 10 MG tablet Take 1 tablet (10 mg total) by mouth at bedtime.  . theophylline (UNIPHYL) 400 MG 24 hr tablet Take 400 mg by mouth daily.   No facility-administered encounter medications on file as of 08/20/2016.     Activities of Daily Living In your present state of health, do you have any difficulty performing the following activities: 08/20/2016 03/16/2016  Hearing? Y N  Vision? - N  Difficulty concentrating or making decisions? Y N  Walking or climbing stairs? Y N  Dressing or bathing? N N  Doing errands, shopping? Y -  Conservation officer, nature and eating ? Y -  Using the Toilet? N -  In the past six months, have you accidently leaked urine? Y -  Do you have problems with loss of bowel control? N -  Managing your Finances? Y -  Housekeeping or managing your Housekeeping? N -  Some recent data might be hidden    Patient Care Team: Birdie Sons, MD as PCP - General (Family Medicine) Estill Cotta, MD as Consulting Physician (Ophthalmology) Erby Pian, MD as Referring Physician (Specialist) Teodoro Spray, MD as Consulting Physician (Cardiology)    Assessment:    Exercise Activities and Dietary recommendations Current Exercise Habits: Home exercise routine, Type of exercise: walking;Other - see comments (machine), Time (Minutes): 20, Frequency (Times/Week): 7, Weekly Exercise (Minutes/Week): 140, Intensity: Mild, Exercise limited by: Other - see comments (machine)  Goals    . Increase water intake          Recommend increasing water intake to 3 glasses a day.      Fall Risk Fall Risk  08/20/2016 03/18/2015  Falls in the past year? No No   Depression Screen PHQ 2/9 Scores 08/20/2016 08/20/2016 03/18/2015  PHQ - 2 Score 0 0 0  PHQ- 9 Score 7 - 3     Cognitive Function       6CIT Screen 08/20/2016  What Year? 0 points  What month? 0 points  What time? 0 points  Count back from 20 0 points  Months in reverse 0 points  Repeat phrase 0 points  Total Score 0    Immunization History  Administered Date(s) Administered  . Influenza Split 04/03/2008, 03/31/2009, 02/20/2010, 02/24/2011  . Influenza, High Dose Seasonal PF 02/25/2014, 03/18/2015  . Influenza,inj,Quad PF,36+ Mos 03/31/2013, 03/19/2016  . Pneumococcal Conjugate-13 02/25/2014  . Pneumococcal Polysaccharide-23 12/06/1999, 04/15/2005  . Tdap 05/07/2011   Screening Tests Health Maintenance  Topic Date Due  .  TETANUS/TDAP  05/06/2021  . INFLUENZA VACCINE  Completed  . DEXA SCAN  Completed  . PNA vac Low Risk Adult  Completed      Plan:  I have personally reviewed and addressed the Medicare Annual Wellness questionnaire and have noted the following in the patient's chart:  A. Medical and social history B. Use of alcohol, tobacco or illicit drugs  C. Current medications and supplements D. Functional ability and status E.  Nutritional status F.  Physical activity G. Advance directives H. List of other physicians I.  Hospitalizations, surgeries, and ER visits in previous 12 months J.  [place] such as hearing and vision if needed, cognitive and depression L. Referrals and appointments - none  In addition, I have reviewed and discussed with patient certain preventive protocols, quality metrics, and best practice recommendations. A written personalized care plan for preventive services as well as general preventive health recommendations were provided to patient.  See attached scanned questionnaire for additional information.   Signed,  Fabio Neighbors, LPN Nurse Health Advisor   MD Recommendations: None.   I have reviewed the health advisor's note, was available for consultation, and agree with documentation and plan  Lelon Huh, MD

## 2016-08-20 NOTE — Patient Instructions (Signed)
Ms. Vicki Morales , Thank you for taking time to come for your Medicare Wellness Visit. I appreciate your ongoing commitment to your health goals. Please review the following plan we discussed and let me know if I can assist you in the future.   Screening recommendations/referrals: Colonoscopy- N/A Mammogram- N/A Bone Density - done 05/01/13 Recommended yearly ophthalmology/optometry visit for glaucoma screening and checkup Recommended yearly dental visit for hygiene and checkup  Vaccinations: Influenza vaccine- last given 03/19/2016 Pneumococcal vaccine- completed series Tdap vaccine- last given 05/07/2011   Advanced directives: Completed and on file   Next appointment: None scheduled after today.   Preventive Care 41 Years and Older, Female Preventive care refers to lifestyle choices and visits with your health care provider that can promote health and wellness. What does preventive care include?  A yearly physical exam. This is also called an annual well check.  Dental exams once or twice a year.  Routine eye exams. Ask your health care provider how often you should have your eyes checked.  Personal lifestyle choices, including:  Daily care of your teeth and gums.  Regular physical activity.  Eating a healthy diet.  Avoiding tobacco and drug use.  Limiting alcohol use.  Practicing safe sex.  Taking low-dose aspirin every day.  Taking vitamin and mineral supplements as recommended by your health care provider. What happens during an annual well check? The services and screenings done by your health care provider during your annual well check will depend on your age, overall health, lifestyle risk factors, and family history of disease. Counseling  Your health care provider may ask you questions about your:  Alcohol use.  Tobacco use.  Drug use.  Emotional well-being.  Home and relationship well-being.  Sexual activity.  Eating habits.  History of  falls.  Memory and ability to understand (cognition).  Work and work Statistician.  Reproductive health. Screening  You may have the following tests or measurements:  Height, weight, and BMI.  Blood pressure.  Lipid and cholesterol levels. These may be checked every 5 years, or more frequently if you are over 56 years old.  Skin check.  Lung cancer screening. You may have this screening every year starting at age 23 if you have a 30-pack-year history of smoking and currently smoke or have quit within the past 15 years.  Fecal occult blood test (FOBT) of the stool. You may have this test every year starting at age 49.  Flexible sigmoidoscopy or colonoscopy. You may have a sigmoidoscopy every 5 years or a colonoscopy every 10 years starting at age 43.  Hepatitis C blood test.  Hepatitis B blood test.  Sexually transmitted disease (STD) testing.  Diabetes screening. This is done by checking your blood sugar (glucose) after you have not eaten for a while (fasting). You may have this done every 1-3 years.  Bone density scan. This is done to screen for osteoporosis. You may have this done starting at age 61.  Mammogram. This may be done every 1-2 years. Talk to your health care provider about how often you should have regular mammograms. Talk with your health care provider about your test results, treatment options, and if necessary, the need for more tests. Vaccines  Your health care provider may recommend certain vaccines, such as:  Influenza vaccine. This is recommended every year.  Tetanus, diphtheria, and acellular pertussis (Tdap, Td) vaccine. You may need a Td booster every 10 years.  Zoster vaccine. You may need this after age 73.  Pneumococcal 13-valent conjugate (PCV13) vaccine. One dose is recommended after age 40.  Pneumococcal polysaccharide (PPSV23) vaccine. One dose is recommended after age 46. Talk to your health care provider about which screenings and  vaccines you need and how often you need them. This information is not intended to replace advice given to you by your health care provider. Make sure you discuss any questions you have with your health care provider. Document Released: 06/20/2015 Document Revised: 02/11/2016 Document Reviewed: 03/25/2015 Elsevier Interactive Patient Education  2017 [employer] Prevention in the Home Falls can cause injuries. They can happen to people of all ages. There are many things you can do to make your home safe and to help prevent falls. What can I do on the outside of my home?  Regularly fix the edges of walkways and driveways and fix any cracks.  Remove anything that might make you trip as you walk through a door, such as a raised step or threshold.  Trim any bushes or trees on the path to your home.  Use bright outdoor lighting.  Clear any walking paths of anything that might make someone trip, such as rocks or tools.  Regularly check to see if handrails are loose or broken. Make sure that both sides of any steps have handrails.  Any raised decks and porches should have guardrails on the edges.  Have any leaves, snow, or ice cleared regularly.  Use sand or salt on walking paths during winter.  Clean up any spills in your garage right away. This includes oil or grease spills. What can I do in the bathroom?  Use night lights.  Install grab bars by the toilet and in the tub and shower. Do not use towel bars as grab bars.  Use non-skid mats or decals in the tub or shower.  If you need to sit down in the shower, use a plastic, non-slip stool.  Keep the floor dry. Clean up any water that spills on the floor as soon as it happens.  Remove soap buildup in the tub or shower regularly.  Attach bath mats securely with double-sided non-slip rug tape.  Do not have throw rugs and other things on the floor that can make you trip. What can I do in the bedroom?  Use night  lights.  Make sure that you have a light by your bed that is easy to reach.  Do not use any sheets or blankets that are too big for your bed. They should not hang down onto the floor.  Have a firm chair that has side arms. You can use this for support while you get dressed.  Do not have throw rugs and other things on the floor that can make you trip. What can I do in the kitchen?  Clean up any spills right away.  Avoid walking on wet floors.  Keep items that you use a lot in easy-to-reach places.  If you need to reach something above you, use a strong step stool that has a grab bar.  Keep electrical cords out of the way.  Do not use floor polish or wax that makes floors slippery. If you must use wax, use non-skid floor wax.  Do not have throw rugs and other things on the floor that can make you trip. What can I do with my stairs?  Do not leave any items on the stairs.  Make sure that there are handrails on both sides of the stairs and use them.  Fix handrails that are broken or loose. Make sure that handrails are as long as the stairways.  Check any carpeting to make sure that it is firmly attached to the stairs. Fix any carpet that is loose or worn.  Avoid having throw rugs at the top or bottom of the stairs. If you do have throw rugs, attach them to the floor with carpet tape.  Make sure that you have a light switch at the top of the stairs and the bottom of the stairs. If you do not have them, ask someone to add them for you. What else can I do to help prevent falls?  Wear shoes that:  Do not have high heels.  Have rubber bottoms.  Are comfortable and fit you well.  Are closed at the toe. Do not wear sandals.  If you use a stepladder:  Make sure that it is fully opened. Do not climb a closed stepladder.  Make sure that both sides of the stepladder are locked into place.  Ask someone to hold it for you, if possible.  Clearly mark and make sure that you can  see:  Any grab bars or handrails.  First and last steps.  Where the edge of each step is.  Use tools that help you move around (mobility aids) if they are needed. These include:  Canes.  Walkers.  Scooters.  Crutches.  Turn on the lights when you go into a dark area. Replace any light bulbs as soon as they burn out.  Set up your furniture so you have a clear path. Avoid moving your furniture around.  If any of your floors are uneven, fix them.  If there are any pets around you, be aware of where they are.  Review your medicines with your doctor. Some medicines can make you feel dizzy. This can increase your chance of falling. Ask your doctor what other things that you can do to help prevent falls. This information is not intended to replace advice given to you by your health care provider. Make sure you discuss any questions you have with your health care provider. Document Released: 03/20/2009 Document Revised: 10/30/2015 Document Reviewed: 06/28/2014 Elsevier Interactive Patient Education  2017 [employer].

## 2016-08-20 NOTE — Progress Notes (Signed)
Patient: Vicki Morales Female    DOB: 12-Apr-1941   76 y.o.   MRN: [id number] Visit Date: 08/20/2016  Today's Provider: Lelon Huh, MD   Chief Complaint  Patient presents with  . Hypertension  . Hyperglycemia  . Anxiety   Subjective:    HPI  Patient presents for follow up her chronic medical medical problems after completing AWV by [facility] earlier today. She generally feels well. Is planning on going on [place] in April and will return April 8th.    Hypertension, follow-up:  BP Readings from Last 3 Encounters:  08/20/16 128/64  07/20/16 130/60  07/14/16 (!) 145/88    She was last seen for hypertension 4 months ago.  BP at that visit was 104/54. Management since that visit includes no changes. She reports good compliance with treatment. She is not having side effects.  She is exercising. She is adherent to low salt diet.   Patient denies exertional chest pressure/discomfort and palpitations.   Cardiovascular risk factors include dyslipidemia and obesity (BMI >= 30 kg/m2).   Weight trend: stable Wt Readings from Last 3 Encounters:  08/20/16 246 lb 12.8 oz (111.9 kg)  07/20/16 242 lb (109.8 kg)  07/14/16 240 lb (108.9 kg)    Current diet: well balanced   Prediabetes, Follow-up:   Lab Results  Component Value Date   HGBA1C 5.1 09/10/2015   HGBA1C 5.1 11/01/2012   GLUCOSE 100 (H) 03/16/2016   GLUCOSE 138 (H) 03/16/2016   GLUCOSE 102 (H) 12/28/2015    Last seen for for this11 months ago.  Management since that visit includes to avoid sweets. Current symptoms include none and have been stable.  Weight trend: stable Prior visit with dietician: no Current diet: well balanced Current exercise: none  Pertinent Labs:    Component Value Date/Time   CHOL 222 (A) 03/27/2014   CHOL 188 11/01/2012 0429   TRIG 97 03/27/2014   TRIG 59 11/01/2012 0429   CREATININE 0.78 03/16/2016 2005   CREATININE 0.82 09/25/2014 1019    Wt Readings from Last 3  Encounters:  08/20/16 246 lb 12.8 oz (111.9 kg)  07/20/16 242 lb (109.8 kg)  07/14/16 240 lb (108.9 kg)   Anxiety, follow up: Patient was last seen on 09/26/15. No changes were made in her medications.    Nocturnal hypoxia  She also needs follow up for nocturnal hypoxia. She hasd o/n oximetry 11/17/2015 ordered by Dr. Ubaldo Glassing for increased pulmonary pressures,  finding significant hypoxias with REI=23.7 and lowest oximetry of 81% on room air. We had ordered formal sleepy study, but she was never able to get it done due to other health problems. She does report that her legs jerk when she is trying to sleep which also keeps her awake.     Allergies  Allergen Reactions  . Arthrotec [Diclofenac-Misoprostol] Hives  . Codeine Other (See Comments)    Reaction:  Headache   . Diclofenac     Pt unsure allergy  . Hydrochlorothiazide Other (See Comments)    Reaction:  Weakness   . Morphine Hives  . Penicillins Other (See Comments)    Doesn't work Has patient had a PCN reaction causing immediate rash, facial/tongue/throat swelling, SOB or lightheadedness with hypotension: No Has patient had a PCN reaction causing severe rash involving mucus membranes or skin necrosis: No Has patient had a PCN reaction that required hospitalization No Has patient had a PCN reaction occurring within the last 10 years: No If all of  the above answers are "NO", then may proceed with Cephalosporin use.  . Sulfa Antibiotics     Doesn't work  . Tiotropium Bromide Monohydrate Other (See Comments)    Reaction:  Blurry vision   . Tylenol [Acetaminophen]     Doesn't work  . Pantoprazole Rash     Current Outpatient Prescriptions:  .  albuterol (PROVENTIL) (2.5 MG/3ML) 0.083% nebulizer solution, Take 2.5 mg by nebulization 2 (two) times daily as needed for wheezing or shortness of breath., Disp: , Rfl:  .  amLODipine (NORVASC) 10 MG tablet, TAKE 1 TABLET(10 MG) BY MOUTH DAILY, Disp: 30 tablet, Rfl: 12 .  Ascorbic Acid  (VITAMIN C) 1000 MG tablet, Take 2,000 mg by mouth daily., Disp: , Rfl:  .  aspirin EC 325 MG tablet, Take 1 tablet (325 mg total) by mouth daily. (Patient taking differently: Take 325 mg by mouth at bedtime. ), Disp: 30 tablet, Rfl: 2 .  benazepril (LOTENSIN) 5 MG tablet, Take 1 tablet (5 mg total) by mouth daily., Disp: 30 tablet, Rfl: 5 .  Calcium Carbonate-Vitamin D (CALCIUM 600+D) 600-400 MG-UNIT tablet, Take 2 tablets by mouth 2 (two) times daily. , Disp: , Rfl:  .  fluticasone (FLONASE) 50 MCG/ACT nasal spray, Place 1 spray into both nostrils daily., Disp: , Rfl:  .  Fluticasone-Salmeterol (ADVAIR) 250-50 MCG/DOSE AEPB, Inhale 1 puff into the lungs daily. , Disp: , Rfl:  .  gabapentin (NEURONTIN) 100 MG capsule, Take 1 capsule (100 mg total) by mouth 2 (two) times daily., Disp: 60 capsule, Rfl: 11 .  gabapentin (NEURONTIN) 300 MG capsule, Take 2 capsules (600 mg total) by mouth at bedtime., Disp: 60 capsule, Rfl: 11 .  loratadine (CLARITIN) 10 MG tablet, Take 10 mg by mouth daily., Disp: , Rfl:  .  magnesium oxide (MAG-OX) 400 MG tablet, Take 400 mg by mouth 2 (two) times daily., Disp: , Rfl:  .  Misc Natural Products (OSTEO BI-FLEX ADV DOUBLE ST PO), Take 2 tablets by mouth daily., Disp: , Rfl:  .  montelukast (SINGULAIR) 10 MG tablet, TAKE 1 TABLET BY MOUTH EVERY DAY, Disp: 30 tablet, Rfl: 12 .  Multiple Vitamin (MULTIVITAMIN WITH MINERALS) TABS tablet, Take 1 tablet by mouth daily., Disp: , Rfl:  .  Omega-3 Fatty Acids (FISH OIL) 1000 MG CAPS, Take by mouth., Disp: , Rfl:  .  omeprazole (PRILOSEC) 20 MG capsule, Take 20 mg by mouth daily., Disp: , Rfl:  .  oxybutynin (DITROPAN-XL) 10 MG 24 hr tablet, TAKE 1 TABLET BY MOUTH AT BEDTIME, Disp: 30 tablet, Rfl: 12 .  Probiotic Product (ALIGN) 4 MG CAPS, Take 1 capsule by mouth daily., Disp: , Rfl:  .  ranitidine (ZANTAC) 150 MG tablet, Take 150 mg by mouth at bedtime., Disp: , Rfl:  .  sertraline (ZOLOFT) 100 MG tablet, Take 1 tablet (100 mg  total) by mouth at bedtime., Disp: 30 tablet, Rfl: 2 .  theophylline (UNIPHYL) 400 MG 24 hr tablet, Take 400 mg by mouth daily., Disp: , Rfl:  .  traMADol (ULTRAM) 50 MG tablet, TAKE 1 TABLET BY MOUTH EVERY 8 HOURS AS NEEDED, Disp: 60 tablet, Rfl: 5 .  trifluoperazine (STELAZINE) 10 MG tablet, Take 1 tablet (10 mg total) by mouth at bedtime., Disp: 30 tablet, Rfl: 2  Review of Systems  Constitutional: Negative.  Negative for appetite change, chills, fatigue and fever.  Respiratory: Negative.  Negative for chest tightness and shortness of breath.   Cardiovascular: Negative.  Negative for chest pain  and palpitations.  Gastrointestinal: Negative for abdominal pain, nausea and vomiting.  Endocrine: Negative.   Neurological: Negative for dizziness and weakness.    Social History  Substance Use Topics  . Smoking status: Former Smoker    Years: 30.00  . Smokeless tobacco: Never Used     Comment: quit in 1989  . Alcohol use No   Objective:   BP  128/64 (BP Location: Right Arm)     Pulse  80     Temp  97.6 F (36.4 C) (Oral)     Ht  5\' 3"  (1.6 m)     Wt  246 lb 12.8 oz (111.9 kg)      BMI  43.72 kg/m       Physical Exam   General Appearance:    Alert, cooperative, no distress  Eyes:    PERRL, conjunctiva/corneas clear, EOM's intact       Lungs:     Clear to auscultation bilaterally, respirations unlabored  Heart:    Regular rate and rhythm  Neurologic:   Awake, alert, oriented x 3. No apparent focal neurological           defect.           Assessment & Plan:     1. Essential (primary) hypertension Stable  - Lipid panel - Renal function panel  2. Osteoarthritis, unspecified osteoarthritis type, unspecified site Stable on current analgesics.   3. Estrogen deficiency  - DG Bone Density; Future  4. Restless leg syndrome  - Ferritin - TSH  5. Pulmonary hypertension/Nocturnal hypoxia Missed MSPG last year due to other health issues. Will have referral  coordinator contact [facility] to reschedule.        Lelon Huh, MD  [facility]

## 2016-08-21 ENCOUNTER — Other Ambulatory Visit: Payer: Self-pay | Admitting: Family Medicine

## 2016-08-21 DIAGNOSIS — G4734 Idiopathic sleep related nonobstructive alveolar hypoventilation: Secondary | ICD-10-CM | POA: Insufficient documentation

## 2016-08-21 LAB — FERRITIN: Ferritin: 56 ng/mL (ref 15–150)

## 2016-08-21 LAB — LIPID PANEL
CHOL/HDL RATIO: 2.6 ratio (ref 0.0–4.4)
Cholesterol, Total: 207 mg/dL — ABNORMAL HIGH (ref 100–199)
HDL: 80 mg/dL (ref >39)
LDL Calculated: 114 mg/dL — ABNORMAL HIGH (ref 0–99)
Triglycerides: 66 mg/dL (ref 0–149)
VLDL CHOLESTEROL CAL: 13 mg/dL (ref 5–40)

## 2016-08-21 LAB — RENAL FUNCTION PANEL
ALBUMIN: 3.8 g/dL (ref 3.5–4.8)
BUN/Creatinine Ratio: 15 (ref 12–28)
BUN: 13 mg/dL (ref 8–27)
CO2: 26 mmol/L (ref 18–29)
Calcium: 9.5 mg/dL (ref 8.7–10.3)
Chloride: 97 mmol/L (ref 96–106)
Creatinine, Ser: 0.85 mg/dL (ref 0.57–1.00)
GFR, EST AFRICAN AMERICAN: 76 mL/min/{1.73_m2} (ref >59)
GFR, EST NON AFRICAN AMERICAN: 66 mL/min/{1.73_m2} (ref >59)
GLUCOSE: 97 mg/dL (ref 65–99)
Phosphorus: 2.8 mg/dL (ref 2.5–4.5)
Potassium: 5 mmol/L (ref 3.5–5.2)
SODIUM: 137 mmol/L (ref 134–144)

## 2016-08-21 LAB — TSH: TSH: 1.97 u[IU]/mL (ref 0.450–4.500)

## 2016-08-21 NOTE — Progress Notes (Unsigned)
Vicki Morales had sleep study ordered for 12/17/2015 (see AMB Correspondence 11/26/2015) for sleepiness and nighttime hypoxia. She was unable to complete due to other health problems that arose. Can you please reschedule [facility]PSG at [facility]. She will be out of town until April 9th, so would like to have it scheduled after that. Thank you!  [name] Kitchen

## 2016-08-22 ENCOUNTER — Other Ambulatory Visit: Payer: Self-pay | Admitting: Family Medicine

## 2016-08-23 ENCOUNTER — Telehealth: Payer: Self-pay

## 2016-08-23 ENCOUNTER — Other Ambulatory Visit: Payer: Self-pay | Admitting: Family Medicine

## 2016-08-23 NOTE — Telephone Encounter (Signed)
-----   Message from Birdie Sons, MD sent at 08/23/2016  1:21 PM EDT ----- Labs are all normal. Continue current medications.  Check labs yearly.

## 2016-08-23 NOTE — Telephone Encounter (Signed)
Patient advised as directed below. Verbalized understanding.  Thanks,  -Isobel Eisenhuth

## 2016-08-24 ENCOUNTER — Telehealth: Payer: Self-pay | Admitting: Family Medicine

## 2016-08-24 NOTE — Telephone Encounter (Signed)
Pt states she had her labs done last week and she is asking if her vitamin B12 and Vitamin K was checked.  RC#[phone]/OE

## 2016-08-24 NOTE — Telephone Encounter (Signed)
Patient reports that she has been very fatigued lately, and she wanted to have her Vitamin B12 and Vitamin K checked. Would you like to see if these tests could be added to the specimen that was drawn on 08/20/16? Please advise. Thanks!

## 2016-08-24 NOTE — Telephone Encounter (Signed)
Order for Bilevel titration faxed to [facility]

## 2016-09-20 ENCOUNTER — Ambulatory Visit (INDEPENDENT_AMBULATORY_CARE_PROVIDER_SITE_OTHER): Payer: Medicare HMO | Admitting: Family Medicine

## 2016-09-20 ENCOUNTER — Encounter: Payer: Self-pay | Admitting: Family Medicine

## 2016-09-20 VITALS — BP 110/70 | HR 86 | Temp 98.0°F | Resp 16

## 2016-09-20 DIAGNOSIS — J40 Bronchitis, not specified as acute or chronic: Secondary | ICD-10-CM | POA: Diagnosis not present

## 2016-09-20 MED ORDER — PREDNISONE 10 MG PO TABS: ORAL_TABLET | ORAL | 0 refills | Status: AC

## 2016-09-20 MED ORDER — LEVOFLOXACIN 750 MG PO TABS: 750.0000 mg | ORAL_TABLET | Freq: Every day | ORAL | 0 refills | Status: AC

## 2016-09-20 NOTE — Progress Notes (Signed)
Patient: Vicki Morales Female    DOB: 1940-12-29   76 y.o.   MRN: [id number] Visit Date: 09/20/2016  Today's Provider: Lelon Huh, MD   Chief Complaint  Patient presents with  . Cough   Subjective:    Patient has had cough for 6 days. Cough is every few minutes and is productive. Patient also has symptoms of runny nose, cough, congestion, wheezing, and sob. Patient has been taking otc mucinex and halls with mild relief. Patient did go to [place] and returned on 09/12/2016.     Cough  This is a new problem. The current episode started in the past 7 days. The problem has been gradually worsening. The problem occurs every few minutes. The cough is productive of sputum. Associated symptoms include chest pain, chills, headaches, myalgias, postnasal drip, rhinorrhea, a sore throat, shortness of breath and wheezing. Pertinent negatives include no ear congestion, ear pain, fever, heartburn, hemoptysis, nasal congestion, rash, sweats or weight loss. The symptoms are aggravated by lying down. Treatments tried: mucinex and Halls. The treatment provided mild relief. Her past medical history is significant for COPD and pneumonia.       Allergies  Allergen Reactions  . Arthrotec [Diclofenac-Misoprostol] Hives  . Codeine Other (See Comments)    Reaction:  Headache   . Diclofenac     Pt unsure allergy  . Hydrochlorothiazide Other (See Comments)    Reaction:  Weakness   . Morphine Hives  . Penicillins Other (See Comments)    Doesn't work Has patient had a PCN reaction causing immediate rash, facial/tongue/throat swelling, SOB or lightheadedness with hypotension: No Has patient had a PCN reaction causing severe rash involving mucus membranes or skin necrosis: No Has patient had a PCN reaction that required hospitalization No Has patient had a PCN reaction occurring within the last 10 years: No If all of the above answers are "NO", then may proceed with Cephalosporin use.  . Sulfa  Antibiotics     Doesn't work  . Tiotropium Bromide Monohydrate Other (See Comments)    Reaction:  Blurry vision   . Tylenol [Acetaminophen]     Doesn't work  . Pantoprazole Rash     Current Outpatient Prescriptions:  .  ADVAIR DISKUS 250-50 MCG/DOSE AEPB, INHALE 1 PUFF INTO THE LUNGS EVERY 12 HOURS AS NEEDED, Disp: 1 each, Rfl: 12 .  albuterol (PROVENTIL) (2.5 MG/3ML) 0.083% nebulizer solution, Take 2.5 mg by nebulization 2 (two) times daily as needed for wheezing or shortness of breath., Disp: , Rfl:  .  amLODipine (NORVASC) 10 MG tablet, TAKE 1 TABLET(10 MG) BY MOUTH DAILY, Disp: 30 tablet, Rfl: 12 .  Ascorbic Acid (VITAMIN C) 1000 MG tablet, Take 2,000 mg by mouth daily., Disp: , Rfl:  .  aspirin EC 325 MG tablet, Take 1 tablet (325 mg total) by mouth daily. (Patient taking differently: Take 325 mg by mouth at bedtime. ), Disp: 30 tablet, Rfl: 2 .  benazepril (LOTENSIN) 5 MG tablet, Take 1 tablet (5 mg total) by mouth daily., Disp: 30 tablet, Rfl: 5 .  Calcium Carbonate-Vitamin D (CALCIUM 600+D) 600-400 MG-UNIT tablet, Take 2 tablets by mouth 2 (two) times daily. , Disp: , Rfl:  .  fluticasone (FLONASE) 50 MCG/ACT nasal spray, Place 1 spray into both nostrils daily., Disp: , Rfl:  .  gabapentin (NEURONTIN) 100 MG capsule, Take 1 capsule (100 mg total) by mouth 2 (two) times daily., Disp: 60 capsule, Rfl: 11 .  gabapentin (NEURONTIN) 300 MG  capsule, Take 2 capsules (600 mg total) by mouth at bedtime., Disp: 60 capsule, Rfl: 11 .  loratadine (CLARITIN) 10 MG tablet, Take 10 mg by mouth daily., Disp: , Rfl:  .  magnesium oxide (MAG-OX) 400 MG tablet, Take 400 mg by mouth 2 (two) times daily., Disp: , Rfl:  .  Misc Natural Products (OSTEO BI-FLEX ADV DOUBLE ST PO), Take 2 tablets by mouth daily., Disp: , Rfl:  .  montelukast (SINGULAIR) 10 MG tablet, TAKE 1 TABLET BY MOUTH EVERY DAY, Disp: 30 tablet, Rfl: 12 .  Multiple Vitamin (MULTIVITAMIN WITH MINERALS) TABS tablet, Take 1 tablet by mouth  daily., Disp: , Rfl:  .  Omega-3 Fatty Acids (FISH OIL) 1000 MG CAPS, Take by mouth., Disp: , Rfl:  .  omeprazole (PRILOSEC) 20 MG capsule, Take 20 mg by mouth daily., Disp: , Rfl:  .  oxybutynin (DITROPAN-XL) 10 MG 24 hr tablet, TAKE 1 TABLET BY MOUTH AT BEDTIME, Disp: 30 tablet, Rfl: 12 .  Probiotic Product (ALIGN) 4 MG CAPS, Take 1 capsule by mouth daily., Disp: , Rfl:  .  ranitidine (ZANTAC) 150 MG tablet, Take 150 mg by mouth at bedtime., Disp: , Rfl:  .  sertraline (ZOLOFT) 100 MG tablet, Take 1 tablet (100 mg total) by mouth at bedtime., Disp: 30 tablet, Rfl: 2 .  theophylline (UNIPHYL) 400 MG 24 hr tablet, TAKE 1 TABLET BY MOUTH DAILY., Disp: 30 tablet, Rfl: 12 .  traMADol (ULTRAM) 50 MG tablet, TAKE 1 TABLET BY MOUTH EVERY 8 HOURS AS NEEDED, Disp: 60 tablet, Rfl: 5 .  trifluoperazine (STELAZINE) 10 MG tablet, Take 1 tablet (10 mg total) by mouth at bedtime., Disp: 30 tablet, Rfl: 2  Review of Systems  Constitutional: Positive for chills. Negative for appetite change, fatigue, fever and weight loss.  HENT: Positive for congestion, postnasal drip, rhinorrhea, sore throat and voice change. Negative for ear pain.   Respiratory: Positive for cough, shortness of breath and wheezing. Negative for hemoptysis and chest tightness.   Cardiovascular: Positive for chest pain. Negative for palpitations.  Gastrointestinal: Negative for abdominal pain, heartburn, nausea and vomiting.  Musculoskeletal: Positive for myalgias.  Skin: Negative for rash.  Neurological: Positive for headaches. Negative for dizziness and weakness.    Social History  Substance Use Topics  . Smoking status: Former Smoker    Years: 30.00  . Smokeless tobacco: Never Used     Comment: quit in 1989  . Alcohol use No   Objective:   BP 110/70 (BP Location: Left Arm, Patient Position: Sitting, Cuff Size: Large)   Pulse 86   Temp 98 F (36.7 C) (Oral)   Resp 16   SpO2 93%     Physical Exam  General Appearance:     Alert, cooperative, no distress  HENT:   neck without nodes and nasal mucosa pale and congested  Eyes:    PERRL, conjunctiva/corneas clear, EOM's intact       Lungs:     Extensive expiratory wheezes and rhonchi, no rales respirations unlabored  Heart:    Regular rate and rhythm  Neurologic:   Awake, alert, oriented x 3. No apparent focal neurological           defect.           Assessment & Plan:     1. Bronchitis  - levofloxacin (LEVAQUIN) 750 MG tablet; Take 1 tablet (750 mg total) by mouth daily.  Dispense: 7 tablet; Refill: 0 - predniSONE (DELTASONE) 10 MG tablet; 6  tablets for 1 day, then 5 for 1 day, then 4 for 1 day, then 3 for 1 day, then 2 for 1 day then 1 for 1 day.  Dispense: 21 tablet; Refill: 0  .cllr       Lelon Huh, MD  [facility]

## 2016-09-22 ENCOUNTER — Other Ambulatory Visit: Payer: Self-pay | Admitting: Family Medicine

## 2016-09-22 MED ORDER — ALBUTEROL SULFATE (2.5 MG/3ML) 0.083% IN NEBU: 2.5000 mg | INHALATION_SOLUTION | Freq: Two times a day (BID) | RESPIRATORY_TRACT | 5 refills | Status: DC | PRN

## 2016-09-22 NOTE — Telephone Encounter (Signed)
Pt's daughter called wanting to get some   albuterol (PROVENTIL) (2.5 MG/3ML) 0.083% nebulizer solution  She was in earlier this week with some congestion and her daughter thinks maybe she needs to use the nebulizer with this congestion.  She is coughing some but not a lot.  Still has chest mucus.  She uses [facility]  Thank sTeri

## 2016-09-25 ENCOUNTER — Other Ambulatory Visit: Payer: Self-pay | Admitting: Family Medicine

## 2016-09-30 ENCOUNTER — Other Ambulatory Visit: Payer: Medicare HMO

## 2016-10-10 ENCOUNTER — Other Ambulatory Visit: Payer: Self-pay | Admitting: Family Medicine

## 2016-10-11 NOTE — Telephone Encounter (Signed)
Rx called in to pharmacy. 

## 2016-10-11 NOTE — Telephone Encounter (Signed)
Please call in diazepam.  

## 2016-10-12 ENCOUNTER — Encounter: Payer: Self-pay | Admitting: Family Medicine

## 2016-10-12 ENCOUNTER — Ambulatory Visit
Admission: RE | Admit: 2016-10-12 | Discharge: 2016-10-12 | Disposition: A | Discharge status: Home / Self Care | Payer: Medicare HMO | Source: Ambulatory Visit | Attending: Family Medicine | Admitting: Family Medicine

## 2016-10-12 ENCOUNTER — Ambulatory Visit (INDEPENDENT_AMBULATORY_CARE_PROVIDER_SITE_OTHER): Payer: Medicare HMO | Admitting: Family Medicine

## 2016-10-12 VITALS — BP 122/72 | HR 82 | Temp 97.6°F | Resp 20 | Wt 242.0 lb

## 2016-10-12 DIAGNOSIS — G8929 Other chronic pain: Secondary | ICD-10-CM | POA: Insufficient documentation

## 2016-10-12 DIAGNOSIS — K5900 Constipation, unspecified: Secondary | ICD-10-CM | POA: Diagnosis not present

## 2016-10-12 DIAGNOSIS — M545 Low back pain: Secondary | ICD-10-CM | POA: Insufficient documentation

## 2016-10-12 NOTE — Progress Notes (Signed)
Patient: Vicki Morales Female    DOB: 23-Apr-1941   76 y.o.   MRN: [id number] Visit Date: 10/12/2016  Today's Provider: Lelon Huh, MD   Chief Complaint  Patient presents with  . Back Pain   Subjective:    Back Pain  This is a chronic problem. Episode onset: 5-6 years ago. The problem occurs intermittently. The problem has been gradually worsening since onset. Quality: spasms. Radiates to: both legs. The symptoms are aggravated by standing. Associated symptoms include leg pain. Pertinent negatives include no abdominal pain, chest pain, fever, headaches, numbness, tingling or weakness. Treatments tried: Snake oil. The treatment provided mild relief.   She states she has been walking a mile every day. She thinks that back pain is due to Zoloft.  Pain keeps her up at night. States that Dr. Michel Bickers started her on gabapenitin    Allergies  Allergen Reactions  . Arthrotec [Diclofenac-Misoprostol] Hives  . Codeine Other (See Comments)    Reaction:  Headache   . Diclofenac     Pt unsure allergy  . Hydrochlorothiazide Other (See Comments)    Reaction:  Weakness   . Morphine Hives  . Penicillins Other (See Comments)    Doesn't work Has patient had a PCN reaction causing immediate rash, facial/tongue/throat swelling, SOB or lightheadedness with hypotension: No Has patient had a PCN reaction causing severe rash involving mucus membranes or skin necrosis: No Has patient had a PCN reaction that required hospitalization No Has patient had a PCN reaction occurring within the last 10 years: No If all of the above answers are "NO", then may proceed with Cephalosporin use.  . Sulfa Antibiotics     Doesn't work  . Tiotropium Bromide Monohydrate Other (See Comments)    Reaction:  Blurry vision   . Tylenol [Acetaminophen]     Doesn't work  . Pantoprazole Rash     Current Outpatient Prescriptions:  .  ADVAIR DISKUS 250-50 MCG/DOSE AEPB, INHALE 1 PUFF INTO THE LUNGS EVERY 12 HOURS AS  NEEDED, Disp: 1 each, Rfl: 12 .  albuterol (PROVENTIL) (2.5 MG/3ML) 0.083% nebulizer solution, Take 3 mLs (2.5 mg total) by nebulization 2 (two) times daily as needed for wheezing or shortness of breath., Disp: 75 mL, Rfl: 5 .  amLODipine (NORVASC) 10 MG tablet, TAKE 1 TABLET(10 MG) BY MOUTH DAILY, Disp: 30 tablet, Rfl: 12 .  Ascorbic Acid (VITAMIN C) 1000 MG tablet, Take 2,000 mg by mouth daily., Disp: , Rfl:  .  aspirin EC 325 MG tablet, Take 1 tablet (325 mg total) by mouth daily. (Patient taking differently: Take 325 mg by mouth at bedtime. ), Disp: 30 tablet, Rfl: 2 .  benazepril (LOTENSIN) 5 MG tablet, TAKE 1 TABLET(5 MG) BY MOUTH DAILY, Disp: 30 tablet, Rfl: 11 .  Calcium Carbonate-Vitamin D (CALCIUM 600+D) 600-400 MG-UNIT tablet, Take 2 tablets by mouth 2 (two) times daily. , Disp: , Rfl:  .  fluticasone (FLONASE) 50 MCG/ACT nasal spray, Place 1 spray into both nostrils daily., Disp: , Rfl:  .  gabapentin (NEURONTIN) 100 MG capsule, Take 1 capsule (100 mg total) by mouth 2 (two) times daily., Disp: 60 capsule, Rfl: 11 .  gabapentin (NEURONTIN) 300 MG capsule, Take 2 capsules (600 mg total) by mouth at bedtime., Disp: 60 capsule, Rfl: 11 .  loratadine (CLARITIN) 10 MG tablet, Take 10 mg by mouth daily., Disp: , Rfl:  .  magnesium oxide (MAG-OX) 400 MG tablet, Take 400 mg by mouth 2 (two)  times daily., Disp: , Rfl:  .  Misc Natural Products (OSTEO BI-FLEX ADV DOUBLE ST PO), Take 2 tablets by mouth daily., Disp: , Rfl:  .  montelukast (SINGULAIR) 10 MG tablet, TAKE 1 TABLET BY MOUTH EVERY DAY, Disp: 30 tablet, Rfl: 12 .  Multiple Vitamin (MULTIVITAMIN WITH MINERALS) TABS tablet, Take 1 tablet by mouth daily., Disp: , Rfl:  .  Omega-3 Fatty Acids (FISH OIL) 1000 MG CAPS, Take by mouth., Disp: , Rfl:  .  omeprazole (PRILOSEC) 20 MG capsule, Take 20 mg by mouth daily., Disp: , Rfl:  .  oxybutynin (DITROPAN-XL) 10 MG 24 hr tablet, TAKE 1 TABLET BY MOUTH AT BEDTIME, Disp: 30 tablet, Rfl: 12 .   Probiotic Product (ALIGN) 4 MG CAPS, Take 1 capsule by mouth daily., Disp: , Rfl:  .  ranitidine (ZANTAC) 150 MG tablet, Take 150 mg by mouth at bedtime., Disp: , Rfl:  .  sertraline (ZOLOFT) 100 MG tablet, Take 1 tablet (100 mg total) by mouth at bedtime., Disp: 30 tablet, Rfl: 2 .  theophylline (UNIPHYL) 400 MG 24 hr tablet, TAKE 1 TABLET BY MOUTH DAILY., Disp: 30 tablet, Rfl: 12 .  traMADol (ULTRAM) 50 MG tablet, TAKE 1 TABLET BY MOUTH EVERY 8 HOURS AS NEEDED, Disp: 60 tablet, Rfl: 5 .  trifluoperazine (STELAZINE) 10 MG tablet, Take 1 tablet (10 mg total) by mouth at bedtime., Disp: 30 tablet, Rfl: 2  Review of Systems  Constitutional: Negative for appetite change, chills, fatigue and fever.  Respiratory: Negative for chest tightness and shortness of breath.   Cardiovascular: Positive for leg swelling. Negative for chest pain and palpitations.  Gastrointestinal: Negative for abdominal pain, nausea and vomiting.  Musculoskeletal: Positive for back pain and myalgias (leg pain).  Neurological: Negative for dizziness, tingling, weakness, numbness and headaches.       Jerking in the legs and spasms in her back    Social History  Substance Use Topics  . Smoking status: Former Smoker    Years: 30.00  . Smokeless tobacco: Never Used     Comment: quit in 1989  . Alcohol use No   Objective:   BP 122/72 (BP Location: Left Arm, Patient Position: Sitting, Cuff Size: Large)   Pulse 82   Temp 97.6 F (36.4 C) (Oral)   Resp 20   Wt 242 lb (109.8 kg)   SpO2 96% Comment: room air  BMI 42.87 kg/m  There were no vitals filed for this visit.   Physical Exam   General Appearance:    Alert, cooperative, no distress  Eyes:    PERRL, conjunctiva/corneas clear, EOM's intact       Lungs:     Clear to auscultation bilaterally, respirations unlabored  Heart:    Regular rate and rhythm  Neurologic:   Awake, alert, oriented x 3. No apparent focal neurological           defect.   MS:  Mild  tenderness of lower thoracic and upper lumbar spine and para-spinous muscles.        Assessment & Plan:     1. Bilateral low back pain, unspecified chronicity, with sciatica presence unspecified  - CK (Creatine Kinase) - Sed Rate (ESR) - DG Lumbar Spine Complete; Future  2. Chronic bilateral low back pain, with sciatica presence unspecified  - CK (Creatine Kinase) - Sed Rate (ESR) - DG Lumbar Spine Complete; Future  Consider muscle relaxer of PT referral after reviewing labs and X-rays.       Elenore Rota  Caryn Section, MD  [facility]

## 2016-10-13 LAB — SEDIMENTATION RATE: Sed Rate: 15 mm/hr (ref 0–40)

## 2016-10-13 LAB — CK: Total CK: 21 U/L — ABNORMAL LOW (ref 24–173)

## 2016-10-14 ENCOUNTER — Telehealth: Payer: Self-pay

## 2016-10-14 DIAGNOSIS — M549 Dorsalgia, unspecified: Secondary | ICD-10-CM

## 2016-10-14 MED ORDER — TIZANIDINE HCL 2 MG PO TABS: 2.0000 mg | ORAL_TABLET | Freq: Three times a day (TID) | ORAL | 0 refills | Status: DC | PRN

## 2016-10-14 NOTE — Telephone Encounter (Signed)
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 Muscle enzymes are normal. Recommend trial of a muscle relaxer. Can try tizanidine, 2mg  one tablet Q8 hours as needed for back pain, #30, no refills.

## 2016-10-14 NOTE — Telephone Encounter (Signed)
Patient advised and agrees with treatment plan. Prescription sent into the pharmacy.  

## 2016-10-15 ENCOUNTER — Telehealth: Payer: Self-pay

## 2016-10-15 NOTE — Telephone Encounter (Signed)
All muscle relaxers can cause sedation. Recommend she just take 1/2 tablet of tizanidine.

## 2016-10-15 NOTE — Telephone Encounter (Signed)
Patient advised and will try this. Patient states the sedation symptoms are better. She wanted to know if she took this at 5 am this morning and by 9 or 10 am she was having the jittery/shaking sensation again and wonders if symptoms should have re started again that fast?-aa Patient also wanted to let you know that hs is trying to exercise and move around as much as she can and has equipment at home to help her exercise

## 2016-10-15 NOTE — Telephone Encounter (Signed)
Patient called back and states that she was started on Tizanidine yesterday and she had to take it this morning at 5 am due to waking up with jerking and shaking sensation like she has been having and saw you for this on 10/12/16. Patient went back to sleep after taking the medication and called states that she feels "loopy, out of it" thinks this medication is too strong maybe. Denies chest pain or shortness of breath just feels weak. She is laying down but her jerking is back and shaking. What can she do for this now? Please review-aa

## 2016-10-15 NOTE — Telephone Encounter (Signed)
LMOVM for pt to return call 

## 2016-10-21 DIAGNOSIS — Z6841 Body Mass Index (BMI) 40.0 and over, adult: Secondary | ICD-10-CM | POA: Diagnosis not present

## 2016-10-21 DIAGNOSIS — R0602 Shortness of breath: Secondary | ICD-10-CM | POA: Diagnosis not present

## 2016-10-21 DIAGNOSIS — J449 Chronic obstructive pulmonary disease, unspecified: Secondary | ICD-10-CM | POA: Diagnosis not present

## 2016-10-26 ENCOUNTER — Telehealth: Payer: Self-pay | Admitting: Family Medicine

## 2016-10-26 DIAGNOSIS — M545 Low back pain, unspecified: Secondary | ICD-10-CM

## 2016-10-26 NOTE — Telephone Encounter (Signed)
Please review-aa 

## 2016-10-26 NOTE — Telephone Encounter (Signed)
Pt states she is still having back spasms and it is keeping her up at night.  Pt states the medication she is taking is not helping.  Pt is requesting a stronger dosage.  Marvin is also requesting her lab results from her last visit/MW

## 2016-10-27 NOTE — Telephone Encounter (Signed)
Her labs were all normal. There aren't any stronger muscle relaxers. Recommend referral. To Dr. Sharlet Salina (Physiatry)

## 2016-10-27 NOTE — Telephone Encounter (Signed)
Patient advised as below. Patient verbalizes understanding and is in agreement with treatment plan. Patient agreed to referral.

## 2016-10-28 ENCOUNTER — Ambulatory Visit: Payer: BLUE CROSS/BLUE SHIELD | Admitting: Psychiatry

## 2016-10-29 ENCOUNTER — Ambulatory Visit (INDEPENDENT_AMBULATORY_CARE_PROVIDER_SITE_OTHER): Payer: Medicare HMO | Admitting: Family Medicine

## 2016-10-29 ENCOUNTER — Encounter: Payer: Self-pay | Admitting: Family Medicine

## 2016-10-29 VITALS — BP 102/60 | HR 63 | Temp 97.6°F | Resp 20

## 2016-10-29 DIAGNOSIS — G2581 Restless legs syndrome: Secondary | ICD-10-CM

## 2016-10-29 MED ORDER — ROPINIROLE HCL 0.25 MG PO TABS: ORAL_TABLET | ORAL | 1 refills | Status: DC

## 2016-10-29 NOTE — Progress Notes (Signed)
Patient: Vicki Morales Female    DOB: 1940/10/21   76 y.o.   MRN: [id number] Visit Date: 10/29/2016  Today's Provider: Lelon Huh, MD   Chief Complaint  Patient presents with  . Leg Pain   Subjective:    Leg Pain   There was no injury mechanism. Nothing aggravates the symptoms.  Patient has a history of restless legs. She has been taking Tizanidine every 8 hours, but states this is no longer effective in controlling her symptoms. Patient states the pain and jerking in her legs is keeping her up at night. Both legs hurt all around at night. Feels like legs want to jerk.  Has been walking on treadmill and walking 1-2 miles a day.      Allergies  Allergen Reactions  . Arthrotec [Diclofenac-Misoprostol] Hives  . Codeine Other (See Comments)    Reaction:  Headache   . Diclofenac     Pt unsure allergy  . Hydrochlorothiazide Other (See Comments)    Reaction:  Weakness   . Morphine Hives  . Penicillins Other (See Comments)    Doesn't work Has patient had a PCN reaction causing immediate rash, facial/tongue/throat swelling, SOB or lightheadedness with hypotension: No Has patient had a PCN reaction causing severe rash involving mucus membranes or skin necrosis: No Has patient had a PCN reaction that required hospitalization No Has patient had a PCN reaction occurring within the last 10 years: No If all of the above answers are "NO", then may proceed with Cephalosporin use.  . Sulfa Antibiotics     Doesn't work  . Tiotropium Bromide Monohydrate Other (See Comments)    Reaction:  Blurry vision   . Tylenol [Acetaminophen]     Doesn't work  . Pantoprazole Rash     Current Outpatient Prescriptions:  .  ADVAIR DISKUS 250-50 MCG/DOSE AEPB, INHALE 1 PUFF INTO THE LUNGS EVERY 12 HOURS AS NEEDED, Disp: 1 each, Rfl: 12 .  albuterol (PROVENTIL) (2.5 MG/3ML) 0.083% nebulizer solution, Take 3 mLs (2.5 mg total) by nebulization 2 (two) times daily as needed for wheezing or  shortness of breath., Disp: 75 mL, Rfl: 5 .  amLODipine (NORVASC) 10 MG tablet, TAKE 1 TABLET(10 MG) BY MOUTH DAILY, Disp: 30 tablet, Rfl: 12 .  Ascorbic Acid (VITAMIN C) 1000 MG tablet, Take 2,000 mg by mouth daily., Disp: , Rfl:  .  aspirin EC 325 MG tablet, Take 1 tablet (325 mg total) by mouth daily. (Patient taking differently: Take 325 mg by mouth at bedtime. ), Disp: 30 tablet, Rfl: 2 .  benazepril (LOTENSIN) 5 MG tablet, TAKE 1 TABLET(5 MG) BY MOUTH DAILY, Disp: 30 tablet, Rfl: 11 .  Calcium Carbonate-Vitamin D (CALCIUM 600+D) 600-400 MG-UNIT tablet, Take 2 tablets by mouth 2 (two) times daily. , Disp: , Rfl:  .  fluticasone (FLONASE) 50 MCG/ACT nasal spray, Place 1 spray into both nostrils daily., Disp: , Rfl:  .  gabapentin (NEURONTIN) 100 MG capsule, Take 1 capsule (100 mg total) by mouth 2 (two) times daily., Disp: 60 capsule, Rfl: 11 .  gabapentin (NEURONTIN) 300 MG capsule, Take 2 capsules (600 mg total) by mouth at bedtime., Disp: 60 capsule, Rfl: 11 .  loratadine (CLARITIN) 10 MG tablet, Take 10 mg by mouth daily., Disp: , Rfl:  .  magnesium oxide (MAG-OX) 400 MG tablet, Take 400 mg by mouth 2 (two) times daily., Disp: , Rfl:  .  Misc Natural Products (OSTEO BI-FLEX ADV DOUBLE ST PO), Take 2  tablets by mouth daily., Disp: , Rfl:  .  montelukast (SINGULAIR) 10 MG tablet, TAKE 1 TABLET BY MOUTH EVERY DAY, Disp: 30 tablet, Rfl: 12 .  Multiple Vitamin (MULTIVITAMIN WITH MINERALS) TABS tablet, Take 1 tablet by mouth daily., Disp: , Rfl:  .  Omega-3 Fatty Acids (FISH OIL) 1000 MG CAPS, Take by mouth., Disp: , Rfl:  .  omeprazole (PRILOSEC) 20 MG capsule, Take 20 mg by mouth daily., Disp: , Rfl:  .  oxybutynin (DITROPAN-XL) 10 MG 24 hr tablet, TAKE 1 TABLET BY MOUTH AT BEDTIME, Disp: 30 tablet, Rfl: 12 .  Probiotic Product (ALIGN) 4 MG CAPS, Take 1 capsule by mouth daily., Disp: , Rfl:  .  ranitidine (ZANTAC) 150 MG tablet, Take 150 mg by mouth at bedtime., Disp: , Rfl:  .  sertraline  (ZOLOFT) 100 MG tablet, Take 1 tablet (100 mg total) by mouth at bedtime., Disp: 30 tablet, Rfl: 2 .  theophylline (UNIPHYL) 400 MG 24 hr tablet, TAKE 1 TABLET BY MOUTH DAILY., Disp: 30 tablet, Rfl: 12 .  tiZANidine (ZANAFLEX) 2 MG tablet, Take 1 tablet (2 mg total) by mouth every 8 (eight) hours as needed for muscle spasms (for back pain)., Disp: 30 tablet, Rfl: 0 .  traMADol (ULTRAM) 50 MG tablet, TAKE 1 TABLET BY MOUTH EVERY 8 HOURS AS NEEDED, Disp: 60 tablet, Rfl: 5 .  trifluoperazine (STELAZINE) 10 MG tablet, Take 1 tablet (10 mg total) by mouth at bedtime., Disp: 30 tablet, Rfl: 2 .  umeclidinium bromide (INCRUSE ELLIPTA) 62.5 MCG/INH AEPB, Inhale 1 puff into the lungs daily., Disp: , Rfl:   Review of Systems  Constitutional: Positive for fatigue. Negative for appetite change, chills and fever.  Respiratory: Negative for chest tightness and shortness of breath.   Cardiovascular: Positive for leg swelling. Negative for chest pain and palpitations.  Gastrointestinal: Negative for abdominal pain, nausea and vomiting.  Musculoskeletal: Positive for myalgias.  Neurological: Positive for weakness. Negative for dizziness.    Social History  Substance Use Topics  . Smoking status: Former Smoker    Years: 30.00  . Smokeless tobacco: Never Used     Comment: quit in 1989  . Alcohol use No   Objective:   BP 102/60 (BP Location: Right Arm, Patient Position: Sitting, Cuff Size: Large)   Pulse 63   Temp 97.6 F (36.4 C) (Oral)   Resp 20   SpO2 97%  There were no vitals filed for this visit.   Physical Exam  General appearance: alert, well developed, well nourished, cooperative and in no distress Head: Normocephalic, without obvious abnormality, atraumatic Respiratory: Respirations even and unlabored, normal respiratory rate Extremities: No gross deformities Skin: Skin color, texture, turgor normal. No rashes seen  Psych: Appropriate mood and affect. Neurologic: Mental status: Alert,  oriented to person, place, and time, thought content appropriate.     Assessment & Plan:     1. Restless leg syndrome  - rOPINIRole (REQUIP) 0.25 MG tablet; One at bedtime for two days, then 2 at bedtime for 5 days, then 3 at bedtime for 1 week, then 4 at bedtime  Dispense: 60 tablet; Refill: 1       Lelon Huh, MD  [facility]

## 2016-11-02 ENCOUNTER — Telehealth: Payer: Self-pay | Admitting: Family Medicine

## 2016-11-02 NOTE — Telephone Encounter (Signed)
FYI--Pt refused referral to Dr Sharlet Salina stating she is not having a lot of pain in her back at present time and would like to hold off on referral

## 2016-11-03 ENCOUNTER — Encounter: Payer: Self-pay | Admitting: Psychiatry

## 2016-11-03 ENCOUNTER — Ambulatory Visit (INDEPENDENT_AMBULATORY_CARE_PROVIDER_SITE_OTHER): Payer: Medicare HMO | Admitting: Psychiatry

## 2016-11-03 VITALS — BP 119/76 | HR 84 | Temp 98.4°F

## 2016-11-03 DIAGNOSIS — F3342 Major depressive disorder, recurrent, in full remission: Secondary | ICD-10-CM

## 2016-11-03 DIAGNOSIS — F209 Schizophrenia, unspecified: Secondary | ICD-10-CM | POA: Diagnosis not present

## 2016-11-03 MED ORDER — SERTRALINE HCL 100 MG PO TABS: 100.0000 mg | ORAL_TABLET | Freq: Every day | ORAL | 2 refills | Status: DC

## 2016-11-03 MED ORDER — TRIFLUOPERAZINE HCL 10 MG PO TABS: 10.0000 mg | ORAL_TABLET | Freq: Every day | ORAL | 2 refills | Status: DC

## 2016-11-03 NOTE — Progress Notes (Signed)
Patient ID: Vicki Morales, female   DOB: 01-04-1941, 76 y.o.   MRN: [id number]   [facility] MD/PA/NP OP Progress Note  11/03/2016 3:09 PM Vicki Morales  MRN:  [id number]  Subjective: . Patient was seen today with her daughter. Patient reports that she has been doing quite well. Reports that she thoroughly enjoyed the cruise that she took in April with her daughter and her family. She was also able to see her sister and brother who she did not seek for 20 years in [place]. States that she feels very peaceful and has been enjoying her life. She has been eating healthy and also been exercising regularly. States that if she feels up to it she would like to take a trip to [place] and would like to fly. Denies any suicidal thoughts.  Chief Complaint: doing well Chief Complaint    Follow-up; Medication Refill     Visit Diagnosis:     ICD-9-CM ICD-10-CM   1. Schizophrenia, unspecified type ([place]) 295.90 F20.9   2. MDD (major depressive disorder), recurrent, in full remission ([place]) 296.36 F33.42     Past Medical History:  Past Medical History:  Diagnosis Date  . Anxiety   . Arthritis   . Cataract    right eye  . COPD (chronic obstructive pulmonary disease) ([place])   . Depression   . Dyspnea    DOE  . GERD (gastroesophageal reflux disease)   . History of hiatal hernia   . Hypertension   . Pain    CHRONIC KNEE  . Paranoid disorder ([place])   . RLS (restless legs syndrome)   . Tremors of nervous system    HANDS    Past Surgical History:  Procedure Laterality Date  . ABDOMINAL HYSTERECTOMY  1988  . APPENDECTOMY  1988  . CARDIAC CATHETERIZATION    . CATARACT EXTRACTION W/PHACO Right 07/14/2016   Procedure: CATARACT EXTRACTION PHACO AND INTRAOCULAR LENS PLACEMENT ([place]) anterior vitrectomy;  Surgeon: Estill Cotta, MD;  Location: [facility] ORS;  Service: Ophthalmology;  Laterality: Right;  [place] 02:40 AP% 24.4 CDE 59.98 Fluid pack # [id number]  . ESOPHAGOGASTRODUODENOSCOPY N/A 11/11/2014   Procedure:  ESOPHAGOGASTRODUODENOSCOPY (EGD);  Surgeon: Josefine Class, MD;  Location: [facility] ENDOSCOPY;  Service: Endoscopy;  Laterality: N/A;  . TONSILLECTOMY AND ADENOIDECTOMY    . TRIGGER FINGER RELEASE  2004   Family History:  Family History  Problem Relation Age of Onset  . Breast cancer Sister   . Stroke Father   . Emphysema Father   . Anxiety disorder Father   . Depression Father   . Anxiety disorder Brother    Social History:  Social History   Social History  . Marital status: Widowed    Spouse name: N/A  . Number of children: N/A  . Years of education: N/A   Social History Main Topics  . Smoking status: Former Smoker    Years: 30.00  . Smokeless tobacco: Never Used     Comment: quit in 1989  . Alcohol use No  . Drug use: No  . Sexual activity: Not Currently   Other Topics Concern  . None   Social History Narrative  . None   Additional History:   Assessment:   Musculoskeletal: Strength & Muscle Tone: within normal limits Gait & Station: Slow and walks with a cane Patient leans: N/A  Psychiatric Specialty Exam: Medication Refill     Review of Systems  Psychiatric/Behavioral: Negative for depression, hallucinations, memory loss, substance abuse and suicidal ideas. The patient  is not nervous/anxious and does not have insomnia.   All other systems reviewed and are negative.   Blood pressure 119/76, pulse 84, temperature 98.4 F (36.9 C), temperature source Oral.There is no height or weight on file to calculate BMI.  General Appearance: Well Groomed  Eye Contact:  Good  Speech:  Normal Rate  Volume:  Normal  Mood:  good  Affect:  Congruent Bright, smiling   Thought Process:  Linear and Logical  Orientation:  Full (Time, Place, and Person)  Thought Content:  Negative  Suicidal Thoughts:  No  Homicidal Thoughts:  No  Memory:  Immediate;   Good Recent;   Good Remote;   Good  Judgement:  Good  Insight:  Good  Psychomotor Activity:  Normal   Concentration:  Good  Recall:  Good  Fund of Knowledge: Good  Language: Good  Akathisia:  Negative  Handed:    AIMS (if indicated):  Mild cogwheeling noted on right arm   Assets:  Communication Skills Desire for Improvement  ADL's:  Intact  Cognition: WNL  Sleep:  fair   Is the patient at risk to self?  No. Has the patient been a risk to self in the past 6 months?  No. Has the patient been a risk to self within the distant past?  Yes.  OD in 1979 Is the patient a risk to others?  No. Has the patient been a risk to others in the past 6 months?  No. Has the patient been a risk to others within the distant past?  No.  Current Medications: Current Outpatient Prescriptions  Medication Sig Dispense Refill  . ADVAIR DISKUS 250-50 MCG/DOSE AEPB INHALE 1 PUFF INTO THE LUNGS EVERY 12 HOURS AS NEEDED 1 each 12  . albuterol (PROVENTIL) (2.5 MG/3ML) 0.083% nebulizer solution Take 3 mLs (2.5 mg total) by nebulization 2 (two) times daily as needed for wheezing or shortness of breath. 75 mL 5  . amLODipine (NORVASC) 10 MG tablet TAKE 1 TABLET(10 MG) BY MOUTH DAILY 30 tablet 12  . Ascorbic Acid (VITAMIN C) 1000 MG tablet Take 2,000 mg by mouth daily.    [name] Kitchen aspirin EC 325 MG tablet Take 1 tablet (325 mg total) by mouth daily. (Patient taking differently: Take 325 mg by mouth at bedtime. ) 30 tablet 2  . benazepril (LOTENSIN) 5 MG tablet TAKE 1 TABLET(5 MG) BY MOUTH DAILY 30 tablet 11  . Calcium Carbonate-Vitamin D (CALCIUM 600+D) 600-400 MG-UNIT tablet Take 2 tablets by mouth 2 (two) times daily.     . fluticasone (FLONASE) 50 MCG/ACT nasal spray Place 1 spray into both nostrils daily.    [name] Kitchen gabapentin (NEURONTIN) 100 MG capsule Take 1 capsule (100 mg total) by mouth 2 (two) times daily. 60 capsule 11  . gabapentin (NEURONTIN) 300 MG capsule Take 2 capsules (600 mg total) by mouth at bedtime. 60 capsule 11  . loratadine (CLARITIN) 10 MG tablet Take 10 mg by mouth daily.    . magnesium oxide (MAG-OX)  400 MG tablet Take 400 mg by mouth 2 (two) times daily.    . Misc Natural Products (OSTEO BI-FLEX ADV DOUBLE ST PO) Take 2 tablets by mouth daily.    . montelukast (SINGULAIR) 10 MG tablet TAKE 1 TABLET BY MOUTH EVERY DAY 30 tablet 12  . Multiple Vitamin (MULTIVITAMIN WITH MINERALS) TABS tablet Take 1 tablet by mouth daily.    . Omega-3 Fatty Acids (FISH OIL) 1000 MG CAPS Take by mouth.    [name] Kitchen omeprazole (  PRILOSEC) 20 MG capsule Take 20 mg by mouth daily.    [name] Kitchen oxybutynin (DITROPAN-XL) 10 MG 24 hr tablet TAKE 1 TABLET BY MOUTH AT BEDTIME 30 tablet 12  . Probiotic Product (ALIGN) 4 MG CAPS Take 1 capsule by mouth daily.    . ranitidine (ZANTAC) 150 MG tablet Take 150 mg by mouth at bedtime.    [name] Kitchen rOPINIRole (REQUIP) 0.25 MG tablet One at bedtime for two days, then 2 at bedtime for 5 days, then 3 at bedtime for 1 week, then 4 at bedtime 60 tablet 1  . sertraline (ZOLOFT) 100 MG tablet Take 1 tablet (100 mg total) by mouth at bedtime. 30 tablet 2  . theophylline (UNIPHYL) 400 MG 24 hr tablet TAKE 1 TABLET BY MOUTH DAILY. 30 tablet 12  . traMADol (ULTRAM) 50 MG tablet TAKE 1 TABLET BY MOUTH EVERY 8 HOURS AS NEEDED 60 tablet 5  . trifluoperazine (STELAZINE) 10 MG tablet Take 1 tablet (10 mg total) by mouth at bedtime. 30 tablet 2  . umeclidinium bromide (INCRUSE ELLIPTA) 62.5 MCG/INH AEPB Inhale 1 puff into the lungs daily.     No current facility-administered medications for this visit.     Medical Decision Making:  Established Problem, Stable/Improving (1) and Review of Medication Regimen & Side Effects (2)  Treatment Plan Summary:Medication management and Plan Plan Schizophrenia- MDD in full remission  Continue sertraline at 100 mg daily  Continue gabapentin 600mg  at bedtime, continue with 100mg  po bid   continue Stelazine at 10  mg once daily at bedtime   She will follow-up in 3 months or call before if needed  Julionna Marczak 11/03/2016, 3:09 PM

## 2016-11-12 ENCOUNTER — Telehealth: Payer: Self-pay | Admitting: Family Medicine

## 2016-11-12 NOTE — Telephone Encounter (Signed)
Back spasms.  Medication not helping.  Pt recently in.  [phone]  [place]

## 2016-11-12 NOTE — Telephone Encounter (Signed)
Patient is still taking 1/2 tablet of tizanidine which she reports is not helping. Patient reports that she can not increase the dose due to side effects. It was recommended that she see Dr. Margette Fast, but she declined due to symptoms getting better. Any other recommendations for the patient to help with muscle spasms? Please advise. Thanks!

## 2016-11-14 ENCOUNTER — Other Ambulatory Visit: Payer: Self-pay | Admitting: Family Medicine

## 2016-11-15 NOTE — Telephone Encounter (Signed)
There is nothing else I can do for her. Let me know if she changes her mind and wants to see Dr. Sharlet Salina.

## 2016-11-16 NOTE — Telephone Encounter (Signed)
Patient was notified. Patient is agreeable to seeing Dr. Sharlet Salina.

## 2016-11-22 ENCOUNTER — Ambulatory Visit: Payer: Medicare HMO | Admitting: Psychiatry

## 2016-11-22 NOTE — Telephone Encounter (Signed)
Vicki Morales from Dr Sharlet Salina office advised that pt  is willing to come in for office visit

## 2016-11-23 DIAGNOSIS — I10 Essential (primary) hypertension: Secondary | ICD-10-CM | POA: Diagnosis not present

## 2016-11-23 DIAGNOSIS — J449 Chronic obstructive pulmonary disease, unspecified: Secondary | ICD-10-CM | POA: Diagnosis not present

## 2016-11-23 DIAGNOSIS — Z8639 Personal history of other endocrine, nutritional and metabolic disease: Secondary | ICD-10-CM | POA: Diagnosis not present

## 2016-11-26 ENCOUNTER — Ambulatory Visit: Payer: Self-pay | Admitting: Family Medicine

## 2016-12-07 ENCOUNTER — Ambulatory Visit (INDEPENDENT_AMBULATORY_CARE_PROVIDER_SITE_OTHER): Payer: Medicare HMO | Admitting: Podiatry

## 2016-12-07 VITALS — BP 148/68 | HR 81 | Resp 16

## 2016-12-07 DIAGNOSIS — M79676 Pain in unspecified toe(s): Secondary | ICD-10-CM | POA: Diagnosis not present

## 2016-12-07 DIAGNOSIS — B351 Tinea unguium: Secondary | ICD-10-CM | POA: Diagnosis not present

## 2016-12-10 ENCOUNTER — Ambulatory Visit: Payer: Self-pay | Admitting: Family Medicine

## 2016-12-10 NOTE — Progress Notes (Signed)
   SUBJECTIVE Patient  presents to office today complaining of elongated, thickened nails. Pain while ambulating in shoes. Patient is unable to trim their own nails.   OBJECTIVE General Patient is awake, alert, and oriented x 3 and in no acute distress. Derm Skin is dry and supple bilateral. Negative open lesions or macerations. Remaining integument unremarkable. Nails are tender, long, thickened and dystrophic with subungual debris, consistent with onychomycosis, 1-5 bilateral. No signs of infection noted. Vasc  DP and PT pedal pulses palpable bilaterally. Temperature gradient within normal limits.  Neuro Epicritic and protective threshold sensation diminished bilaterally.  Musculoskeletal Exam No symptomatic pedal deformities noted bilateral. Muscular strength within normal limits.  ASSESSMENT 1. Onychodystrophic nails 1-5 bilateral with hyperkeratosis of nails.  2. Onychomycosis of nail due to dermatophyte bilateral 3. Pain in foot bilateral  PLAN OF CARE 1. Patient evaluated today.  2. Instructed to maintain good pedal hygiene and foot care.  3. Mechanical debridement of nails 1-5 bilaterally performed using a nail nipper. Filed with dremel without incident.  4. Return to clinic in 3 months with Dr. Prudence Davidson.    Edrick Kins, DPM [facility]  Dr. Edrick Kins, [address]                                        [place], [place] [address]                Office [phone]  Fax [phone]

## 2016-12-14 ENCOUNTER — Encounter: Payer: Self-pay | Admitting: Family Medicine

## 2016-12-14 ENCOUNTER — Ambulatory Visit (INDEPENDENT_AMBULATORY_CARE_PROVIDER_SITE_OTHER): Payer: Medicare HMO | Admitting: Family Medicine

## 2016-12-14 VITALS — BP 108/68 | HR 78 | Temp 97.6°F | Resp 16 | Wt 241.0 lb

## 2016-12-14 DIAGNOSIS — D229 Melanocytic nevi, unspecified: Secondary | ICD-10-CM

## 2016-12-14 DIAGNOSIS — N3000 Acute cystitis without hematuria: Secondary | ICD-10-CM

## 2016-12-14 DIAGNOSIS — R350 Frequency of micturition: Secondary | ICD-10-CM

## 2016-12-14 LAB — POCT URINALYSIS DIPSTICK
Bilirubin, UA: NEGATIVE
Blood, UA: NEGATIVE
GLUCOSE UA: NEGATIVE
Ketones, UA: NEGATIVE
NITRITE UA: NEGATIVE
PROTEIN UA: NEGATIVE
SPEC GRAV UA: 1.01 (ref 1.010–1.025)
UROBILINOGEN UA: 0.2 U/dL
pH, UA: 7 (ref 5.0–8.0)

## 2016-12-14 MED ORDER — CIPROFLOXACIN HCL 500 MG PO TABS: 500.0000 mg | ORAL_TABLET | Freq: Two times a day (BID) | ORAL | 0 refills | Status: DC

## 2016-12-14 NOTE — Progress Notes (Signed)
Patient: Vicki Morales Female    DOB: 04/27/1941   76 y.o.   MRN: [id number] Visit Date: 12/14/2016  Today's Provider: Lelon Huh, MD   Chief Complaint  Patient presents with  . Urinary Tract Infection   Subjective:    HPI Patient here today C/O frequent urination x's week and half. Patient denies fever, vaginal discharge, blood in urine or burning. Patient reports having recurrent UTI's. Patient denies taking any medications for UTI. Patient reports drinking liquids well.   Patient C/O of a spot "mole" on her back for several years. Patient reports itching and denies any pain, or changes to spot on her back.      Allergies  Allergen Reactions  . Arthrotec [Diclofenac-Misoprostol] Hives  . Codeine Other (See Comments)    Reaction:  Headache   . Diclofenac Other (See Comments) and Hives    Pt unsure allergy  . Hydrochlorothiazide Other (See Comments)    Reaction:  Weakness   . Morphine Hives  . Penicillins Other (See Comments)    Doesn't work Has patient had a PCN reaction causing immediate rash, facial/tongue/throat swelling, SOB or lightheadedness with hypotension: No Has patient had a PCN reaction causing severe rash involving mucus membranes or skin necrosis: No Has patient had a PCN reaction that required hospitalization No Has patient had a PCN reaction occurring within the last 10 years: No If all of the above answers are "NO", then may proceed with Cephalosporin use.  . Sulfa Antibiotics Other (See Comments)    Doesn't work  . Tiotropium Bromide Monohydrate Other (See Comments)    Reaction:  Blurry vision   . Tylenol [Acetaminophen] Other (See Comments)    Doesn't work  . Pantoprazole Rash     Current Outpatient Prescriptions:  .  ADVAIR DISKUS 250-50 MCG/DOSE AEPB, INHALE 1 PUFF INTO THE LUNGS EVERY 12 HOURS AS NEEDED, Disp: 1 each, Rfl: 12 .  albuterol (PROVENTIL) (2.5 MG/3ML) 0.083% nebulizer solution, Take 3 mLs (2.5 mg total) by nebulization 2  (two) times daily as needed for wheezing or shortness of breath., Disp: 75 mL, Rfl: 5 .  amLODipine (NORVASC) 10 MG tablet, TAKE 1 TABLET(10 MG) BY MOUTH DAILY, Disp: 30 tablet, Rfl: 12 .  Ascorbic Acid (VITAMIN C) 1000 MG tablet, Take 2,000 mg by mouth daily., Disp: , Rfl:  .  aspirin EC 325 MG tablet, Take 1 tablet (325 mg total) by mouth daily. (Patient taking differently: Take 325 mg by mouth at bedtime. ), Disp: 30 tablet, Rfl: 2 .  benazepril (LOTENSIN) 5 MG tablet, TAKE 1 TABLET(5 MG) BY MOUTH DAILY, Disp: 30 tablet, Rfl: 11 .  Calcium Carbonate-Vitamin D (CALCIUM 600+D) 600-400 MG-UNIT tablet, Take 2 tablets by mouth 2 (two) times daily. , Disp: , Rfl:  .  CRANBERRY CONCENTRATE PO, Take 1 capsule by mouth daily., Disp: , Rfl:  .  fluticasone (FLONASE) 50 MCG/ACT nasal spray, Place 1 spray into both nostrils daily., Disp: , Rfl:  .  gabapentin (NEURONTIN) 100 MG capsule, Take 1 capsule (100 mg total) by mouth 2 (two) times daily., Disp: 60 capsule, Rfl: 11 .  gabapentin (NEURONTIN) 300 MG capsule, Take 2 capsules (600 mg total) by mouth at bedtime., Disp: 60 capsule, Rfl: 11 .  hydrOXYzine (ATARAX/VISTARIL) 25 MG tablet, hydroxyzine HCl 25 mg tablet, Disp: , Rfl:  .  loratadine (CLARITIN) 10 MG tablet, Take 10 mg by mouth daily., Disp: , Rfl:  .  magnesium oxide (MAG-OX) 400 MG tablet,  Take 400 mg by mouth 2 (two) times daily., Disp: , Rfl:  .  meloxicam (MOBIC) 15 MG tablet, meloxicam 15 mg tablet  TAKE 1 TABLET BY MOUTH EVERY DAY WITH A MEALS, Disp: , Rfl:  .  Misc Natural Products (OSTEO BI-FLEX ADV DOUBLE ST PO), Take 2 tablets by mouth daily., Disp: , Rfl:  .  montelukast (SINGULAIR) 10 MG tablet, TAKE 1 TABLET BY MOUTH EVERY DAY, Disp: 30 tablet, Rfl: 12 .  Multiple Vitamin (MULTIVITAMIN WITH MINERALS) TABS tablet, Take 1 tablet by mouth daily., Disp: , Rfl:  .  Omega-3 Fatty Acids (FISH OIL) 1000 MG CAPS, Take by mouth., Disp: , Rfl:  .  omeprazole (PRILOSEC) 20 MG capsule, Take 20 mg  by mouth daily., Disp: , Rfl:  .  oxybutynin (DITROPAN-XL) 10 MG 24 hr tablet, TAKE 1 TABLET BY MOUTH AT BEDTIME, Disp: 30 tablet, Rfl: 12 .  Probiotic Product (ALIGN) 4 MG CAPS, Take 1 capsule by mouth daily., Disp: , Rfl:  .  ranitidine (ZANTAC) 150 MG tablet, Take 150 mg by mouth at bedtime., Disp: , Rfl:  .  sertraline (ZOLOFT) 100 MG tablet, Take 1 tablet (100 mg total) by mouth at bedtime., Disp: 30 tablet, Rfl: 2 .  theophylline (UNIPHYL) 400 MG 24 hr tablet, TAKE 1 TABLET BY MOUTH DAILY., Disp: 30 tablet, Rfl: 12 .  traMADol (ULTRAM) 50 MG tablet, TAKE 1 TABLET BY MOUTH EVERY 8 HOURS AS NEEDED, Disp: 60 tablet, Rfl: 5 .  trifluoperazine (STELAZINE) 10 MG tablet, Take 1 tablet (10 mg total) by mouth at bedtime., Disp: 30 tablet, Rfl: 2 .  umeclidinium bromide (INCRUSE ELLIPTA) 62.5 MCG/INH AEPB, Inhale 1 puff into the lungs daily., Disp: , Rfl:  .  clonazePAM (KLONOPIN) 0.5 MG tablet, Take by mouth., Disp: , Rfl:  .  Influenza Vac Split High-Dose (FLUZONE HIGH-DOSE) 0.5 ML SUSY, Fluzone High-Dose 2017-2018 (PF) 180 mcg/0.5 mL intramuscular syringe  ADM 0.5ML IM UTD, Disp: , Rfl:  .  rOPINIRole (REQUIP) 0.25 MG tablet, One at bedtime for two days, then 2 at bedtime for 5 days, then 3 at bedtime for 1 week, then 4 at bedtime, Disp: 60 tablet, Rfl: 1  Review of Systems  Constitutional: Positive for fatigue.  Cardiovascular: Negative.   Genitourinary: Positive for frequency.  Skin: Positive for rash.    Social History  Substance Use Topics  . Smoking status: Former Smoker    Years: 30.00  . Smokeless tobacco: Never Used     Comment: quit in 1989  . Alcohol use No   Objective:   BP 108/68 (BP Location: Left Arm, Patient Position: Sitting, Cuff Size: Large)   Pulse 78   Temp 97.6 F (36.4 C) (Oral)   Resp 16   Wt 241 lb (109.3 kg)   SpO2 97%   BMI 42.69 kg/m  Vitals:   12/14/16 1645  BP: 108/68  Pulse: 78  Resp: 16  Temp: 97.6 F (36.4 C)  TempSrc: Oral  SpO2: 97%    Weight: 241 lb (109.3 kg)     Physical Exam  General appearance: alert, well developed, well nourished, cooperative and in no distress Head: Normocephalic, without obvious abnormality, atraumatic Respiratory: Respirations even and unlabored, normal respiratory rate Extremities: No gross deformities Skin: Several small moles of back some more darkly pigmented than others.  Psych: Appropriate mood and affect. Neurologic: Mental status: Alert, oriented to person, place, and time, thought content appropriate.  Results for orders placed or performed in visit on 12/14/16  POCT urinalysis dipstick  Result Value Ref Range   Color, UA yellow    Clarity, UA cloudy    Glucose, UA negative    Bilirubin, UA negative    Ketones, UA negative    Spec Grav, UA 1.010 1.010 - 1.025   Blood, UA negative    pH, UA 7.0 5.0 - 8.0   Protein, UA negative    Urobilinogen, UA 0.2 0.2 or 1.0 E.U./dL   Nitrite, UA negative    Leukocytes, UA Large (3+) (A) Negative       Assessment & Plan:     1. Frequent urination  - POCT urinalysis dipstick  2. Acute cystitis without hematuria  - ciprofloxacin (CIPRO) 500 MG tablet; Take 1 tablet (500 mg total) by mouth 2 (two) times daily.  Dispense: 6 tablet; Refill: 0  3. Numerous moles  - Ambulatory referral to Dermatology       Lelon Huh, MD  [facility]

## 2016-12-14 NOTE — Telephone Encounter (Signed)
Appointment made with Dr Sharlet Salina 02/01/17 at 10:45

## 2016-12-21 ENCOUNTER — Ambulatory Visit: Payer: Self-pay | Admitting: Family Medicine

## 2016-12-27 ENCOUNTER — Ambulatory Visit: Payer: Medicare HMO | Admitting: Family Medicine

## 2016-12-28 ENCOUNTER — Encounter: Payer: Self-pay | Admitting: Family Medicine

## 2016-12-28 ENCOUNTER — Ambulatory Visit (INDEPENDENT_AMBULATORY_CARE_PROVIDER_SITE_OTHER): Payer: Medicare HMO | Admitting: Family Medicine

## 2016-12-28 VITALS — BP 122/70 | HR 74 | Temp 97.6°F | Resp 18

## 2016-12-28 DIAGNOSIS — R21 Rash and other nonspecific skin eruption: Secondary | ICD-10-CM | POA: Diagnosis not present

## 2016-12-28 DIAGNOSIS — M1990 Unspecified osteoarthritis, unspecified site: Secondary | ICD-10-CM

## 2016-12-28 MED ORDER — TRAMADOL HCL 50 MG PO TABS: 100.0000 mg | ORAL_TABLET | Freq: Two times a day (BID) | ORAL | 0 refills | Status: DC

## 2016-12-28 MED ORDER — METRONIDAZOLE 1 % EX GEL: Freq: Every day | CUTANEOUS | 0 refills | Status: DC

## 2016-12-28 NOTE — Progress Notes (Signed)
Patient: Vicki Morales Female    DOB: 31-Mar-1941   76 y.o.   MRN: [id number] Visit Date: 12/28/2016  Today's Provider: Lelon Huh, MD   Chief Complaint  Patient presents with  . Rash   Subjective:    Rash  This is a new problem. Episode onset: 2 weeks ago. The problem has been gradually worsening since onset. The affected locations include the face (left side of nose and cheek). The rash is characterized by dryness and scaling. She was exposed to nothing. Associated symptoms include joint pain. Pertinent negatives include no fatigue, fever, shortness of breath or vomiting. Treatments tried: Crisco. The treatment provided mild relief.       Allergies  Allergen Reactions  . Arthrotec [Diclofenac-Misoprostol] Hives  . Codeine Other (See Comments)    Reaction:  Headache   . Diclofenac Other (See Comments) and Hives    Pt unsure allergy  . Hydrochlorothiazide Other (See Comments)    Reaction:  Weakness   . Morphine Hives  . Penicillins Other (See Comments)    Doesn't work Has patient had a PCN reaction causing immediate rash, facial/tongue/throat swelling, SOB or lightheadedness with hypotension: No Has patient had a PCN reaction causing severe rash involving mucus membranes or skin necrosis: No Has patient had a PCN reaction that required hospitalization No Has patient had a PCN reaction occurring within the last 10 years: No If all of the above answers are "NO", then may proceed with Cephalosporin use.  . Sulfa Antibiotics Other (See Comments)    Doesn't work  . Tiotropium Bromide Monohydrate Other (See Comments)    Reaction:  Blurry vision   . Tylenol [Acetaminophen] Other (See Comments)    Doesn't work  . Pantoprazole Rash     Current Outpatient Prescriptions:  .  ADVAIR DISKUS 250-50 MCG/DOSE AEPB, INHALE 1 PUFF INTO THE LUNGS EVERY 12 HOURS AS NEEDED, Disp: 1 each, Rfl: 12 .  albuterol (PROVENTIL) (2.5 MG/3ML) 0.083% nebulizer solution, Take 3 mLs (2.5 mg  total) by nebulization 2 (two) times daily as needed for wheezing or shortness of breath., Disp: 75 mL, Rfl: 5 .  amLODipine (NORVASC) 10 MG tablet, TAKE 1 TABLET(10 MG) BY MOUTH DAILY, Disp: 30 tablet, Rfl: 12 .  Ascorbic Acid (VITAMIN C) 1000 MG tablet, Take 2,000 mg by mouth daily., Disp: , Rfl:  .  aspirin EC 325 MG tablet, Take 1 tablet (325 mg total) by mouth daily. (Patient taking differently: Take 325 mg by mouth at bedtime. ), Disp: 30 tablet, Rfl: 2 .  benazepril (LOTENSIN) 5 MG tablet, TAKE 1 TABLET(5 MG) BY MOUTH DAILY, Disp: 30 tablet, Rfl: 11 .  Calcium Carbonate-Vitamin D (CALCIUM 600+D) 600-400 MG-UNIT tablet, Take 2 tablets by mouth 2 (two) times daily. , Disp: , Rfl:  .  ciprofloxacin (CIPRO) 500 MG tablet, Take 1 tablet (500 mg total) by mouth 2 (two) times daily., Disp: 6 tablet, Rfl: 0 .  clonazePAM (KLONOPIN) 0.5 MG tablet, Take by mouth., Disp: , Rfl:  .  CRANBERRY CONCENTRATE PO, Take 1 capsule by mouth daily., Disp: , Rfl:  .  fluticasone (FLONASE) 50 MCG/ACT nasal spray, Place 1 spray into both nostrils daily., Disp: , Rfl:  .  gabapentin (NEURONTIN) 100 MG capsule, Take 1 capsule (100 mg total) by mouth 2 (two) times daily., Disp: 60 capsule, Rfl: 11 .  gabapentin (NEURONTIN) 300 MG capsule, Take 2 capsules (600 mg total) by mouth at bedtime., Disp: 60 capsule, Rfl: 11 .  hydrOXYzine (ATARAX/VISTARIL) 25 MG tablet, hydroxyzine HCl 25 mg tablet, Disp: , Rfl:  .  Influenza Vac Split High-Dose (FLUZONE HIGH-DOSE) 0.5 ML SUSY, Fluzone High-Dose 2017-2018 (PF) 180 mcg/0.5 mL intramuscular syringe  ADM 0.5ML IM UTD, Disp: , Rfl:  .  loratadine (CLARITIN) 10 MG tablet, Take 10 mg by mouth daily., Disp: , Rfl:  .  magnesium oxide (MAG-OX) 400 MG tablet, Take 400 mg by mouth 2 (two) times daily., Disp: , Rfl:  .  meloxicam (MOBIC) 15 MG tablet, meloxicam 15 mg tablet  TAKE 1 TABLET BY MOUTH EVERY DAY WITH A MEALS, Disp: , Rfl:  .  Misc Natural Products (OSTEO BI-FLEX ADV DOUBLE ST  PO), Take 2 tablets by mouth daily., Disp: , Rfl:  .  montelukast (SINGULAIR) 10 MG tablet, TAKE 1 TABLET BY MOUTH EVERY DAY, Disp: 30 tablet, Rfl: 12 .  Multiple Vitamin (MULTIVITAMIN WITH MINERALS) TABS tablet, Take 1 tablet by mouth daily., Disp: , Rfl:  .  Omega-3 Fatty Acids (FISH OIL) 1000 MG CAPS, Take by mouth., Disp: , Rfl:  .  omeprazole (PRILOSEC) 20 MG capsule, Take 20 mg by mouth daily., Disp: , Rfl:  .  oxybutynin (DITROPAN-XL) 10 MG 24 hr tablet, TAKE 1 TABLET BY MOUTH AT BEDTIME, Disp: 30 tablet, Rfl: 12 .  Probiotic Product (ALIGN) 4 MG CAPS, Take 1 capsule by mouth daily., Disp: , Rfl:  .  ranitidine (ZANTAC) 150 MG tablet, Take 150 mg by mouth at bedtime., Disp: , Rfl:  .  rOPINIRole (REQUIP) 0.25 MG tablet, One at bedtime for two days, then 2 at bedtime for 5 days, then 3 at bedtime for 1 week, then 4 at bedtime, Disp: 60 tablet, Rfl: 1 .  sertraline (ZOLOFT) 100 MG tablet, Take 1 tablet (100 mg total) by mouth at bedtime., Disp: 30 tablet, Rfl: 2 .  theophylline (UNIPHYL) 400 MG 24 hr tablet, TAKE 1 TABLET BY MOUTH DAILY., Disp: 30 tablet, Rfl: 12 .  traMADol (ULTRAM) 50 MG tablet, TAKE 1 TABLET BY MOUTH EVERY 8 HOURS AS NEEDED, Disp: 60 tablet, Rfl: 5 .  trifluoperazine (STELAZINE) 10 MG tablet, Take 1 tablet (10 mg total) by mouth at bedtime., Disp: 30 tablet, Rfl: 2 .  umeclidinium bromide (INCRUSE ELLIPTA) 62.5 MCG/INH AEPB, Inhale 1 puff into the lungs daily., Disp: , Rfl:   Review of Systems  Constitutional: Negative for appetite change, chills, fatigue and fever.  Respiratory: Negative for chest tightness and shortness of breath.   Cardiovascular: Negative for chest pain and palpitations.  Gastrointestinal: Negative for abdominal pain, nausea and vomiting.  Musculoskeletal: Positive for joint pain.  Skin: Positive for rash.  Neurological: Negative for dizziness and weakness.    Social History  Substance Use Topics  . Smoking status: Former Smoker    Years:  30.00  . Smokeless tobacco: Never Used     Comment: quit in 1989  . Alcohol use No   Objective:   BP 122/70 (BP Location: Left Arm, Patient Position: Sitting, Cuff Size: Large)   Pulse 74   Temp 97.6 F (36.4 C) (Oral)   Resp 18   SpO2 98% Comment: room air There were no vitals filed for this visit.   Physical Exam  General appearance: alert, well developed, well nourished, cooperative and in no distress Head: Normocephalic, without obvious abnormality, atraumatic Skin: Actinoform eruption under both eyes, L>R.     Assessment & Plan:     1. Rash She has appointment scheduled with dermatology on 9-17 and  encouraged to keep appointment. In the meantime try: - metroNIDAZOLE (METROGEL) 1 % gel; Apply topically daily.  Dispense: 45 g; Refill: 0  2. Osteoarthritis, unspecified osteoarthritis type, unspecified site She states tramadol is not working as well as it used to. Advised can increase from 1 Q8 hours to  2x50mg  BID.        Lelon Huh, MD  [facility]

## 2016-12-29 ENCOUNTER — Telehealth: Payer: Self-pay | Admitting: Family Medicine

## 2016-12-29 NOTE — Telephone Encounter (Signed)
Pt states she was in yesterday for rash and when she went to pick up the rx it was almost 100.00.  She said she can not afford the medication.  She wants  To know if something else can be sent in  Pts' call back is  [phone]  Thanks teri

## 2016-12-29 NOTE — Telephone Encounter (Signed)
Please call pharmacy and see if they have a less expensive formulation of topical  metronidazole.

## 2016-12-29 NOTE — Telephone Encounter (Signed)
Patient was advised. KW 

## 2016-12-29 NOTE — Telephone Encounter (Signed)
Please advise patient there is no less expensive alternatives. If she doesn't want to pay for this prescription it is ok to wait until she sees dermatologist.

## 2016-12-29 NOTE — Telephone Encounter (Signed)
Pharmacist at [facility] states there is not a less expensive formulation of metronidazole. He said the 0.75% is also $97.00. He also states patient's insurance is medicare and medication is non-formulary and she may be having to pay part of deductible also.

## 2017-01-18 DIAGNOSIS — H2512 Age-related nuclear cataract, left eye: Secondary | ICD-10-CM | POA: Diagnosis not present

## 2017-01-20 DIAGNOSIS — R0609 Other forms of dyspnea: Secondary | ICD-10-CM | POA: Diagnosis not present

## 2017-01-20 DIAGNOSIS — G479 Sleep disorder, unspecified: Secondary | ICD-10-CM | POA: Diagnosis not present

## 2017-01-20 DIAGNOSIS — J449 Chronic obstructive pulmonary disease, unspecified: Secondary | ICD-10-CM | POA: Diagnosis not present

## 2017-02-01 ENCOUNTER — Other Ambulatory Visit: Payer: Self-pay | Admitting: Physical Medicine and Rehabilitation

## 2017-02-01 DIAGNOSIS — M5136 Other intervertebral disc degeneration, lumbar region: Secondary | ICD-10-CM | POA: Diagnosis not present

## 2017-02-01 DIAGNOSIS — M5416 Radiculopathy, lumbar region: Secondary | ICD-10-CM

## 2017-02-01 DIAGNOSIS — M48062 Spinal stenosis, lumbar region with neurogenic claudication: Secondary | ICD-10-CM | POA: Diagnosis not present

## 2017-02-02 DIAGNOSIS — H2512 Age-related nuclear cataract, left eye: Secondary | ICD-10-CM | POA: Diagnosis not present

## 2017-02-03 ENCOUNTER — Ambulatory Visit: Payer: Medicare HMO | Admitting: Psychiatry

## 2017-02-04 ENCOUNTER — Telehealth: Payer: Self-pay | Admitting: Family Medicine

## 2017-02-04 NOTE — Telephone Encounter (Signed)
FYI--There have been several attempts to contact pt  concerning appointment to dermatology for moles.Pt has not returned calls.No appointment has been made

## 2017-02-08 ENCOUNTER — Telehealth: Payer: Self-pay | Admitting: Family Medicine

## 2017-02-08 NOTE — Telephone Encounter (Signed)
Pt stated she was returning Sarah's call and would like a call back. Thanks TNP

## 2017-02-10 ENCOUNTER — Encounter: Payer: Self-pay | Admitting: *Deleted

## 2017-02-15 ENCOUNTER — Ambulatory Visit
Admission: RE | Admit: 2017-02-15 | Discharge: 2017-02-15 | Disposition: A | Discharge status: Home / Self Care | Payer: Medicare HMO | Source: Ambulatory Visit | Attending: Physical Medicine and Rehabilitation | Admitting: Physical Medicine and Rehabilitation

## 2017-02-15 DIAGNOSIS — M5116 Intervertebral disc disorders with radiculopathy, lumbar region: Secondary | ICD-10-CM | POA: Insufficient documentation

## 2017-02-15 DIAGNOSIS — M5416 Radiculopathy, lumbar region: Secondary | ICD-10-CM | POA: Diagnosis present

## 2017-02-15 DIAGNOSIS — M48061 Spinal stenosis, lumbar region without neurogenic claudication: Secondary | ICD-10-CM | POA: Insufficient documentation

## 2017-02-15 NOTE — H&P (Signed)
See scanned note.

## 2017-02-16 ENCOUNTER — Ambulatory Visit: Payer: Medicare HMO | Admitting: Certified Registered Nurse Anesthetist

## 2017-02-16 ENCOUNTER — Encounter
Admission: RE | Disposition: A | Discharge status: Home / Self Care | Payer: Self-pay | Source: Ambulatory Visit | Attending: Ophthalmology

## 2017-02-16 ENCOUNTER — Ambulatory Visit
Admission: RE | Admit: 2017-02-16 | Discharge: 2017-02-16 | Disposition: A | Discharge status: Home / Self Care | Payer: Medicare HMO | Source: Ambulatory Visit | Attending: Ophthalmology | Admitting: Ophthalmology

## 2017-02-16 ENCOUNTER — Encounter: Payer: Self-pay | Admitting: Anesthesiology

## 2017-02-16 DIAGNOSIS — R51 Headache: Secondary | ICD-10-CM | POA: Insufficient documentation

## 2017-02-16 DIAGNOSIS — K219 Gastro-esophageal reflux disease without esophagitis: Secondary | ICD-10-CM | POA: Diagnosis not present

## 2017-02-16 DIAGNOSIS — F419 Anxiety disorder, unspecified: Secondary | ICD-10-CM | POA: Insufficient documentation

## 2017-02-16 DIAGNOSIS — F209 Schizophrenia, unspecified: Secondary | ICD-10-CM | POA: Insufficient documentation

## 2017-02-16 DIAGNOSIS — H2512 Age-related nuclear cataract, left eye: Secondary | ICD-10-CM | POA: Insufficient documentation

## 2017-02-16 DIAGNOSIS — J45909 Unspecified asthma, uncomplicated: Secondary | ICD-10-CM | POA: Insufficient documentation

## 2017-02-16 DIAGNOSIS — M1990 Unspecified osteoarthritis, unspecified site: Secondary | ICD-10-CM | POA: Insufficient documentation

## 2017-02-16 DIAGNOSIS — J449 Chronic obstructive pulmonary disease, unspecified: Secondary | ICD-10-CM | POA: Insufficient documentation

## 2017-02-16 DIAGNOSIS — I10 Essential (primary) hypertension: Secondary | ICD-10-CM | POA: Insufficient documentation

## 2017-02-16 DIAGNOSIS — F329 Major depressive disorder, single episode, unspecified: Secondary | ICD-10-CM | POA: Diagnosis not present

## 2017-02-16 DIAGNOSIS — Z87891 Personal history of nicotine dependence: Secondary | ICD-10-CM | POA: Diagnosis not present

## 2017-02-16 HISTORY — DX: Personal history of other specified conditions: Z87.898

## 2017-02-16 HISTORY — DX: Orthopnea: R06.01

## 2017-02-16 HISTORY — DX: Edema, unspecified: R60.9

## 2017-02-16 HISTORY — DX: Unspecified urinary incontinence: R32

## 2017-02-16 HISTORY — DX: Cardiac arrhythmia, unspecified: I49.9

## 2017-02-16 HISTORY — PX: CATARACT EXTRACTION W/PHACO: SHX586

## 2017-02-16 HISTORY — DX: Unspecified hearing loss, unspecified ear: H91.90

## 2017-02-16 SURGERY — PHACOEMULSIFICATION, CATARACT, WITH IOL INSERTION
Anesthesia: Monitor Anesthesia Care | Site: Eye | Laterality: Left | Wound class: Clean

## 2017-02-16 MED ORDER — CYCLOPENTOLATE HCL 2 % OP SOLN
1.0000 [drp] | OPHTHALMIC | Status: AC
  Administered 2017-02-16 (×4): 1 [drp] via OPHTHALMIC

## 2017-02-16 MED ORDER — LIDOCAINE HCL (PF) 4 % IJ SOLN
INTRAMUSCULAR | Status: DC | PRN
  Administered 2017-02-16: 4 mL via OPHTHALMIC

## 2017-02-16 MED ORDER — PHENYLEPHRINE HCL 10 % OP SOLN
1.0000 [drp] | OPHTHALMIC | Status: AC
  Administered 2017-02-16 (×4): 1 [drp] via OPHTHALMIC

## 2017-02-16 MED ORDER — ALFENTANIL 500 MCG/ML IJ INJ
INJECTION | INTRAVENOUS | Status: DC | PRN
  Administered 2017-02-16: 500 ug via INTRAVENOUS

## 2017-02-16 MED ORDER — TETRACAINE HCL 0.5 % OP SOLN
OPHTHALMIC | Status: DC | PRN
  Administered 2017-02-16: 2 [drp] via OPHTHALMIC

## 2017-02-16 MED ORDER — POVIDONE-IODINE 5 % OP SOLN
OPHTHALMIC | Status: DC | PRN
  Administered 2017-02-16: 1 via OPHTHALMIC

## 2017-02-16 MED ORDER — NA CHONDROIT SULF-NA HYALURON 40-17 MG/ML IO SOLN
INTRAOCULAR | Status: DC | PRN
  Administered 2017-02-16: 1 mL via INTRAOCULAR

## 2017-02-16 MED ORDER — SODIUM CHLORIDE 0.9 % IV SOLN
INTRAVENOUS | Status: DC
  Administered 2017-02-16: 10:00:00 via INTRAVENOUS

## 2017-02-16 MED ORDER — BSS IO SOLN
INTRAOCULAR | Status: DC | PRN
  Administered 2017-02-16: 200 mL via OPHTHALMIC

## 2017-02-16 MED ORDER — MIDAZOLAM HCL 2 MG/2ML IJ SOLN
INTRAMUSCULAR | Status: AC
  Filled 2017-02-16: qty 2

## 2017-02-16 MED ORDER — CARBACHOL 0.01 % IO SOLN
INTRAOCULAR | Status: DC | PRN
  Administered 2017-02-16: 0.5 mL via INTRAOCULAR

## 2017-02-16 MED ORDER — MOXIFLOXACIN HCL 0.5 % OP SOLN
OPHTHALMIC | Status: DC | PRN
  Administered 2017-02-16: 1 [drp] via OPHTHALMIC

## 2017-02-16 MED ORDER — MOXIFLOXACIN HCL 0.5 % OP SOLN
1.0000 [drp] | OPHTHALMIC | Status: AC
  Administered 2017-02-16 (×3): 1 [drp] via OPHTHALMIC

## 2017-02-16 MED ORDER — MIDAZOLAM HCL 2 MG/2ML IJ SOLN
INTRAMUSCULAR | Status: DC | PRN
Start: 2017-02-16 — End: 2017-02-16
  Administered 2017-02-16: 1 mg via INTRAVENOUS

## 2017-02-16 MED ORDER — CEFUROXIME OPHTHALMIC INJECTION 1 MG/0.1 ML
INJECTION | OPHTHALMIC | Status: DC | PRN
  Administered 2017-02-16: 0.1 mL via INTRACAMERAL

## 2017-02-16 SURGICAL SUPPLY — 26 items
CORD BIP STRL DISP 12FT (MISCELLANEOUS) ×3 IMPLANT
DRAPE XRAY CASSETTE 23X24 (DRAPES) ×3 IMPLANT
ERASER HMR WETFIELD 18G (MISCELLANEOUS) ×3 IMPLANT
GLOVE BIO SURGEON STRL SZ8 (GLOVE) ×3 IMPLANT
GLOVE SURG LX 6.5 MICRO (GLOVE) ×2
GLOVE SURG LX 8.0 MICRO (GLOVE) ×2
GLOVE SURG LX STRL 6.5 MICRO (GLOVE) ×1 IMPLANT
GLOVE SURG LX STRL 8.0 MICRO (GLOVE) ×1 IMPLANT
GOWN STRL REUS W/ TWL LRG LVL3 (GOWN DISPOSABLE) ×1 IMPLANT
GOWN STRL REUS W/ TWL XL LVL3 (GOWN DISPOSABLE) ×1 IMPLANT
GOWN STRL REUS W/TWL LRG LVL3 (GOWN DISPOSABLE) ×2
GOWN STRL REUS W/TWL XL LVL3 (GOWN DISPOSABLE) ×2
LABEL CATARACT MEDS ST (LABEL) ×3 IMPLANT
LENS IOL ACRSF IQ ULTRA 18.0 (Intraocular Lens) ×1 IMPLANT
LENS IOL ACRYSOF IQ 18.0 (Intraocular Lens) ×3 IMPLANT
PACK CATARACT (MISCELLANEOUS) ×3 IMPLANT
PACK CATARACT DINGLEDEIN LX (MISCELLANEOUS) ×3 IMPLANT
PACK EYE AFTER SURG (MISCELLANEOUS) ×3 IMPLANT
SHLD EYE VISITEC  UNIV (MISCELLANEOUS) ×3 IMPLANT
SOL BSS BAG (MISCELLANEOUS) ×3
SOLUTION BSS BAG (MISCELLANEOUS) ×1 IMPLANT
SUT ETHILON 10 0 CS140 6 (SUTURE) ×3 IMPLANT
SUT SILK 5-0 (SUTURE) ×3 IMPLANT
SYR 5ML LL (SYRINGE) ×3 IMPLANT
WATER STERILE IRR 250ML POUR (IV SOLUTION) ×3 IMPLANT
WIPE NON LINTING 3.25X3.25 (MISCELLANEOUS) ×3 IMPLANT

## 2017-02-16 NOTE — Anesthesia Preprocedure Evaluation (Signed)
Anesthesia Evaluation  Patient identified by MRN, date of birth, ID band Patient awake    Reviewed: Allergy & Precautions, NPO status , Patient's Chart, lab work & pertinent test results, reviewed documented beta blocker date and time   Airway Mallampati: III  TM Distance: >3 FB     Dental  (+) Chipped   Pulmonary shortness of breath, asthma , COPD, former smoker,           Cardiovascular hypertension, Pt. on medications + Orthopnea  + dysrhythmias      Neuro/Psych  Headaches, PSYCHIATRIC DISORDERS Anxiety Depression Schizophrenia    GI/Hepatic hiatal hernia, GERD  Controlled,  Endo/Other    Renal/GU      Musculoskeletal  (+) Arthritis ,   Abdominal   Peds  Hematology   Anesthesia Other Findings Obese.  Reproductive/Obstetrics                             Anesthesia Physical Anesthesia Plan  ASA: III  Anesthesia Plan: MAC   Post-op Pain Management:    Induction:   PONV Risk Score and Plan:   Airway Management Planned:   Additional Equipment:   Intra-op Plan:   Post-operative Plan:   Informed Consent: I have reviewed the patients History and Physical, chart, labs and discussed the procedure including the risks, benefits and alternatives for the proposed anesthesia with the patient or authorized representative who has indicated his/her understanding and acceptance.     Plan Discussed with: CRNA  Anesthesia Plan Comments:         Anesthesia Quick Evaluation

## 2017-02-16 NOTE — Anesthesia Procedure Notes (Signed)
Performed by: Taurean Ju Pre-anesthesia Checklist: Patient identified, Emergency Drugs available, Suction available, Patient being monitored and Timeout performed Oxygen Delivery Method: Nasal cannula       

## 2017-02-16 NOTE — Op Note (Signed)
Date of Surgery: 02/16/2017 Date of Dictation: 02/16/2017 10:42 AM Pre-operative Diagnosis:  Nuclear Sclerotic Cataract left Eye Post-operative Diagnosis: same Procedure performed: Extra-capsular Cataract Extraction (ECCE) with placement of a posterior chamber intraocular lens (IOL) left Eye IOL:  Implant Name Type Inv. Item Serial No. Manufacturer Lot No. LRB No. Used  LENS IOL ACRYSOF IQ 18.0 - [id number] Intraocular Lens LENS IOL ACRYSOF IQ 18.0 [id number] ALCON   Left 1   Anesthesia: 2% Lidocaine and 4% Marcaine in a 50/50 mixture with 10 unites/ml of Hylenex given as a peribulbar Anesthesiologist: Anesthesiologist: Gunnar Bulla, MD CRNA: Demetrius Charity, CRNA Complications: none Estimated Blood Loss: less than 1 ml  Description of procedure:  The patient was given anesthesia and sedation via intravenous access. The patient was then prepped and draped in the usual fashion. A 25-gauge needle was bent for initiating the capsulorhexis. A 5-0 silk suture was placed through the conjunctiva superior and inferiorly to serve as bridle sutures. Hemostasis was obtained at the superior limbus using an eraser cautery. A partial thickness groove was made at the anterior surgical limbus with a 64 Beaver blade and this was dissected anteriorly with an [employer]aya. The anterior chamber was entered at 10 o'clock with a 1.0 mm paracentesis knife and through the lamellar dissection with a 2.6 mm Alcon keratome. Epi-Shugarcaine 0.5 CC [9 cc BSS Plus (Alcon), 3 cc 4% preservative-free lidocaine (Hospira) and 4 cc 1:1000 preservative-free, bisulfite-free epinephrine] was injected into the anterior chamber via the paracentesis tract. Epi-Shugarcaine 0.5 CC [9 cc BSS Plus (Alcon), 3 cc 4% preservative-free lidocaine (Hospira) and 4 cc 1:1000 preservative-free, bisulfite-free epinephrine] was injected into the anterior chamber via the paracentesis tract. DiscoVisc was injected to replace the aqueous and a  continuous tear curvilinear capsulorhexis was performed using a bent 25-gauge needle.  Balance salt on a syringe was used to perform hydro-dissection and phacoemulsification was carried out using a divide and conquer technique. Procedure(s) with comments: CATARACT EXTRACTION PHACO AND INTRAOCULAR LENS PLACEMENT (IOC) (Left) - Lot # [id number] H [place]: 01:11.8 AP%: 23.5 CDE: 32.09 . Irrigation/aspiration was used to remove the residual cortex and the capsular bag was inflated with DiscoVisc. The intraocular lens was inserted into the capsular bag using a pre-loaded UltraSert Delivery System. Irrigation/aspiration was used to remove the residual DiscoVisc. The wound was inflated with balanced salt. There was some positive pressure so a single 10-0 nylon suture was placed across the wound and tied, rotated and the knot buried. Balance salt was used to deepen the chamger. The wound was checked for leaks. None were found. Miostat was injected via the paracentesis track and 0.1 ml of cefuroxime containing 1 mg of drug  was injected via the paracentesis track. The wound was checked for leaks again and none were found.   The bridal sutures were removed and two drops of Vigamox were placed on the eye. An eye shield was placed to protect the eye and the patient was discharged to the recovery area in good condition.   Honestee Revard MD

## 2017-02-16 NOTE — Anesthesia Postprocedure Evaluation (Signed)
Anesthesia Post Note  Patient: Vicki Morales  Procedure(s) Performed: Procedure(s) (LRB): CATARACT EXTRACTION PHACO AND INTRAOCULAR LENS PLACEMENT (IOC) (Left)  Patient location during evaluation: PACU Anesthesia Type: MAC Level of consciousness: awake and alert Pain management: pain level controlled Vital Signs Assessment: post-procedure vital signs reviewed and stable Respiratory status: spontaneous breathing, nonlabored ventilation, respiratory function stable and patient connected to nasal cannula oxygen Cardiovascular status: stable and blood pressure returned to baseline Anesthetic complications: no     Last Vitals:  Vitals:   02/16/17 1043 02/16/17 1045  BP: (!) 156/109 (!) 170/89  Pulse:    Resp: 16   Temp: (!) 35.8 C   SpO2: 100%     Last Pain:  Vitals:   02/16/17 0905  TempSrc: Oral                 Daryus Sowash S

## 2017-02-16 NOTE — Anesthesia Post-op Follow-up Note (Signed)
Anesthesia QCDR form completed.        

## 2017-02-16 NOTE — Transfer of Care (Signed)
Immediate Anesthesia Transfer of Care Note  Patient: Vicki Morales  Procedure(s) Performed: Procedure(s) with comments: CATARACT EXTRACTION PHACO AND INTRAOCULAR LENS PLACEMENT (IOC) (Left) - Lot # [id number] H [place]: 01:11.8 AP%: 23.5 CDE: 32.09   Patient Location: PACU  Anesthesia Type:MAC  Level of Consciousness: awake, alert  and oriented  Airway & Oxygen Therapy: Patient Spontanous Breathing  Post-op Assessment: Report given to RN and Post -op Vital signs reviewed and stable  Post vital signs: Reviewed and stable  Last Vitals:  Vitals:   02/16/17 0905  BP: (!) 169/72  Pulse: 84  Resp: 18  Temp: 36.4 C  SpO2: 100%    Last Pain:  Vitals:   02/16/17 0905  TempSrc: Oral         Complications: No apparent anesthesia complications

## 2017-02-16 NOTE — Discharge Instructions (Signed)
Eye Surgery Discharge Instructions  Expect mild scratchy sensation or mild soreness. DO NOT RUB YOUR EYE!  The day of surgery:  Minimal physical activity, but bed rest is not required  No reading, computer work, or close hand work  No bending, lifting, or straining.  May watch TV  For 24 hours:  No driving, legal decisions, or alcoholic beverages  Safety precautions  Eat anything you prefer: It is better to start with liquids, then soup then solid foods.  _____ Eye patch should be worn until postoperative exam tomorrow.  ____ Solar shield eyeglasses should be worn for comfort in the sunlight/patch while sleeping  Resume all regular medications including aspirin or Coumadin if these were discontinued prior to surgery. You may shower, bathe, shave, or wash your hair. Tylenol may be taken for mild discomfort.  Call your doctor if you experience significant pain, nausea, or vomiting, fever > 101 or other signs of infection. [phone] or [phone] Specific instructions:  Follow-up Information    Delories Mauri, Remo Lipps, MD Follow up.   Specialty:  Ophthalmology Why:  September 13 at 11:00am Contact information: [address]   [place] [address] [phone]

## 2017-02-16 NOTE — Interval H&P Note (Signed)
History and Physical Interval Note:  02/16/2017 9:52 AM  Vicki Morales  has presented today for surgery, with the diagnosis of CATARACT  The various methods of treatment have been discussed with the patient and family. After consideration of risks, benefits and other options for treatment, the patient has consented to  Procedure(s): CATARACT EXTRACTION PHACO AND INTRAOCULAR LENS PLACEMENT ([place]) (Left) as a surgical intervention .  The patient's history has been reviewed, patient examined, no change in status, stable for surgery.  I have reviewed the patient's chart and labs.  Questions were answered to the patient's satisfaction.     [name]

## 2017-02-17 ENCOUNTER — Ambulatory Visit: Payer: Medicare HMO | Admitting: Psychiatry

## 2017-02-17 ENCOUNTER — Ambulatory Visit (INDEPENDENT_AMBULATORY_CARE_PROVIDER_SITE_OTHER): Payer: Medicare HMO | Admitting: Psychiatry

## 2017-02-17 VITALS — BP 139/75 | HR 75

## 2017-02-17 DIAGNOSIS — F3342 Major depressive disorder, recurrent, in full remission: Secondary | ICD-10-CM | POA: Diagnosis not present

## 2017-02-17 DIAGNOSIS — F209 Schizophrenia, unspecified: Secondary | ICD-10-CM | POA: Diagnosis not present

## 2017-02-17 MED ORDER — TRIFLUOPERAZINE HCL 10 MG PO TABS: 10.0000 mg | ORAL_TABLET | Freq: Every day | ORAL | 2 refills | Status: DC

## 2017-02-17 MED ORDER — SERTRALINE HCL 50 MG PO TABS: 50.0000 mg | ORAL_TABLET | Freq: Every day | ORAL | 2 refills | Status: DC

## 2017-02-17 NOTE — Progress Notes (Signed)
Patient ID: Vicki Morales, female   DOB: 03/15/1941, 76 y.o.   MRN: [id number]   [facility] MD/PA/NP OP Progress Note  02/17/2017 1:28 PM Vicki Morales  MRN:  [id number]  Subjective: . Patient was seen today with her daughter. Patient reports that she has been doing quite well. She had cataract surgery yesterday, went well. Mood is stable , enjoying doing sudoku. Fair sleep and appetite.   Reports some frustration from not being able to move around much but overall reports being in a good mood and denies any psychotic symptoms. Denies any mood symptoms. Tolerating her medications without side effects. Patient reports that the she is taking the sertraline at 50 mg. Her daughter reports this is the dosage she has been taking.  Chief Complaint: doing well  Visit Diagnosis:     ICD-10-CM   1. Schizophrenia, unspecified type ([place]) F20.9   2. MDD (major depressive disorder), recurrent, in full remission ([place]) F33.42     Past Medical History:  Past Medical History:  Diagnosis Date  . Anxiety   . Arthritis   . Cataract    right eye  . COPD (chronic obstructive pulmonary disease) ([place])   . Depression   . Dyspnea    DOE  . Dysrhythmia   . Edema    FEET/ANKLES  . GERD (gastroesophageal reflux disease)   . H/O wheezing   . History of hiatal hernia   . HOH (hard of hearing)   . Hypertension   . Incontinence of urine in female   . Orthopnea   . Pain    CHRONIC KNEE  . Pain    CHRONIC KNEE  . Paranoid disorder ([place])   . RLS (restless legs syndrome)   . Tremors of nervous system    HANDS    Past Surgical History:  Procedure Laterality Date  . ABDOMINAL HYSTERECTOMY  1988  . APPENDECTOMY  1988  . CARDIAC CATHETERIZATION    . CATARACT EXTRACTION W/PHACO Right 07/14/2016   Procedure: CATARACT EXTRACTION PHACO AND INTRAOCULAR LENS PLACEMENT ([place]) anterior vitrectomy;  Surgeon: Estill Cotta, MD;  Location: [facility] ORS;  Service: Ophthalmology;  Laterality: Right;  [place] 02:40 AP% 24.4 CDE  59.98 Fluid pack # [id number]  . CATARACT EXTRACTION W/PHACO Left 02/16/2017   Procedure: CATARACT EXTRACTION PHACO AND INTRAOCULAR LENS PLACEMENT (IOC);  Surgeon: Estill Cotta, MD;  Location: [facility] ORS;  Service: Ophthalmology;  Laterality: Left;  Lot # [id number] H [place]: 01:11.8 AP%: 23.5 CDE: 32.09   . ESOPHAGOGASTRODUODENOSCOPY N/A 11/11/2014   Procedure: ESOPHAGOGASTRODUODENOSCOPY (EGD);  Surgeon: Josefine Class, MD;  Location: [facility] ENDOSCOPY;  Service: Endoscopy;  Laterality: N/A;  . TONSILLECTOMY AND ADENOIDECTOMY    . TRIGGER FINGER RELEASE  2004   Family History:  Family History  Problem Relation Age of Onset  . Breast cancer Sister   . Stroke Father   . Emphysema Father   . Anxiety disorder Father   . Depression Father   . Anxiety disorder Brother    Social History:  Social History   Social History  . Marital status: Widowed    Spouse name: N/A  . Number of children: N/A  . Years of education: N/A   Social History Main Topics  . Smoking status: Former Smoker    Years: 30.00  . Smokeless tobacco: Never Used     Comment: quit in 1989  . Alcohol use No  . Drug use: No  . Sexual activity: Not Currently   Other Topics Concern  . Not on  file   Social History Narrative  . No narrative on file   Additional History:   Assessment:   Musculoskeletal: Strength & Muscle Tone: within normal limits Gait & Station: Slow and walks with a cane Patient leans: N/A  Psychiatric Specialty Exam: Medication Refill     Review of Systems  Psychiatric/Behavioral: Negative for depression, hallucinations, memory loss, substance abuse and suicidal ideas. The patient is not nervous/anxious and does not have insomnia.   All other systems reviewed and are negative.   There were no vitals taken for this visit.There is no height or weight on file to calculate BMI.  General Appearance: Well Groomed  Eye Contact:  Good  Speech:  Normal Rate  Volume:  Normal  Mood:  good   Affect:  Congruent Bright, smiling   Thought Process:  Linear and Logical  Orientation:  Full (Time, Place, and Person)  Thought Content:  Negative  Suicidal Thoughts:  No  Homicidal Thoughts:  No  Memory:  Immediate;   Good Recent;   Good Remote;   Good  Judgement:  Good  Insight:  Good  Psychomotor Activity:  Normal  Concentration:  Good  Recall:  Good  Fund of Knowledge: Good  Language: Good  Akathisia:  Negative  Handed:    AIMS (if indicated):  Mild cogwheeling noted on right arm   Assets:  Communication Skills Desire for Improvement  ADL's:  Intact  Cognition: WNL  Sleep:  fair   Is the patient at risk to self?  No. Has the patient been a risk to self in the past 6 months?  No. Has the patient been a risk to self within the distant past?  Yes.  OD in 1979 Is the patient a risk to others?  No. Has the patient been a risk to others in the past 6 months?  No. Has the patient been a risk to others within the distant past?  No.  Current Medications: Current Outpatient Prescriptions  Medication Sig Dispense Refill  . ADVAIR DISKUS 250-50 MCG/DOSE AEPB INHALE 1 PUFF INTO THE LUNGS EVERY 12 HOURS AS NEEDED (Patient taking differently: INHALE 1 PUFF INTO THE LUNGS EVERY 12 HOURS) 1 each 12  . albuterol (PROVENTIL) (2.5 MG/3ML) 0.083% nebulizer solution Take 3 mLs (2.5 mg total) by nebulization 2 (two) times daily as needed for wheezing or shortness of breath. 75 mL 5  . amLODipine (NORVASC) 10 MG tablet TAKE 1 TABLET(10 MG) BY MOUTH DAILY 30 tablet 12  . aspirin EC 325 MG tablet Take 1 tablet (325 mg total) by mouth daily. (Patient taking differently: Take 325 mg by mouth at bedtime. ) 30 tablet 2  . benazepril (LOTENSIN) 5 MG tablet TAKE 1 TABLET(5 MG) BY MOUTH DAILY (Patient taking differently: TAKE 1 TABLET(5 MG) BY MOUTH DAILY at bedtime) 30 tablet 11  . Calcium Carbonate-Vitamin D (CALCIUM 600+D) 600-400 MG-UNIT tablet Take 1 tablet by mouth 2 (two) times daily.     [name] Kitchen  CRANBERRY CONCENTRATE PO Take 1 capsule by mouth daily.    . fluticasone (FLONASE) 50 MCG/ACT nasal spray Place 1 spray into both nostrils daily.    [name] Kitchen gabapentin (NEURONTIN) 100 MG capsule Take 1 capsule (100 mg total) by mouth 2 (two) times daily. 60 capsule 11  . gabapentin (NEURONTIN) 300 MG capsule Take 2 capsules (600 mg total) by mouth at bedtime. 60 capsule 11  . loratadine (CLARITIN) 10 MG tablet Take 10 mg by mouth daily.    . magnesium oxide (MAG-OX) 400  MG tablet Take 400 mg by mouth 2 (two) times daily.    . meloxicam (MOBIC) 15 MG tablet TAKE 1 TABLET BY MOUTH EVERY DAY WITH A MEALS    . metroNIDAZOLE (METROGEL) 1 % gel Apply topically daily. (Patient taking differently: Apply 1 application topically daily as needed (rosacea). ) 45 g 0  . Misc Natural Products (OSTEO BI-FLEX ADV DOUBLE ST PO) Take 1 tablet by mouth 2 (two) times daily.     . montelukast (SINGULAIR) 10 MG tablet TAKE 1 TABLET BY MOUTH EVERY DAY 30 tablet 12  . Multiple Vitamin (MULTIVITAMIN WITH MINERALS) TABS tablet Take 1 tablet by mouth daily.    . Omega-3 Fatty Acids (FISH OIL) 1000 MG CAPS Take 1,000 mg by mouth daily.     [name] Kitchen omeprazole (PRILOSEC) 20 MG capsule Take 20 mg by mouth daily before breakfast.     . OVER THE COUNTER MEDICATION Take 2 tablets by mouth daily. Estra-C    . OVER THE COUNTER MEDICATION Take 2 capsules by mouth 2 (two) times daily. Kyloic    . oxybutynin (DITROPAN-XL) 10 MG 24 hr tablet TAKE 1 TABLET BY MOUTH AT BEDTIME (Patient taking differently: TAKE 1 TABLET BY MOUTH DAILY) 30 tablet 12  . Probiotic Product (ALIGN) 4 MG CAPS Take 4 mg by mouth daily.     . ranitidine (ZANTAC) 150 MG tablet Take 150 mg by mouth at bedtime.    [name] Kitchen rOPINIRole (REQUIP) 0.25 MG tablet One at bedtime for two days, then 2 at bedtime for 5 days, then 3 at bedtime for 1 week, then 4 at bedtime 60 tablet 1  . sertraline (ZOLOFT) 100 MG tablet Take 1 tablet (100 mg total) by mouth at bedtime. (Patient taking  differently: Take 50 mg by mouth at bedtime. ) 30 tablet 2  . theophylline (UNIPHYL) 400 MG 24 hr tablet TAKE 1 TABLET BY MOUTH DAILY. 30 tablet 12  . traMADol (ULTRAM) 50 MG tablet Take 2 tablets (100 mg total) by mouth 2 (two) times daily. 1 tablet 0  . trifluoperazine (STELAZINE) 10 MG tablet Take 1 tablet (10 mg total) by mouth at bedtime. 30 tablet 2   No current facility-administered medications for this visit.     Medical Decision Making:  Established Problem, Stable/Improving (1) and Review of Medication Regimen & Side Effects (2)  Treatment Plan Summary:Medication management and Plan Plan   MDD in full remission Continue sertraline at 50 mg daily  Continue gabapentin per Dr.Fisher   Schizophrenia  continue Stelazine at 10  mg once daily at bedtime   She will follow-up in 3 months or call before if needed  Ashwin Tibbs 02/17/2017, 1:28 PM

## 2017-02-18 ENCOUNTER — Ambulatory Visit: Payer: Self-pay | Admitting: Family Medicine

## 2017-02-22 ENCOUNTER — Other Ambulatory Visit: Payer: Self-pay | Admitting: Family Medicine

## 2017-02-22 ENCOUNTER — Ambulatory Visit: Payer: Self-pay | Admitting: Family Medicine

## 2017-02-23 DIAGNOSIS — M5136 Other intervertebral disc degeneration, lumbar region: Secondary | ICD-10-CM | POA: Diagnosis not present

## 2017-02-23 DIAGNOSIS — M48062 Spinal stenosis, lumbar region with neurogenic claudication: Secondary | ICD-10-CM | POA: Diagnosis not present

## 2017-02-23 DIAGNOSIS — M5416 Radiculopathy, lumbar region: Secondary | ICD-10-CM | POA: Diagnosis not present

## 2017-03-07 ENCOUNTER — Telehealth: Payer: Self-pay | Admitting: Family Medicine

## 2017-03-07 DIAGNOSIS — G2581 Restless legs syndrome: Secondary | ICD-10-CM

## 2017-03-07 NOTE — Telephone Encounter (Signed)
Order placed for neurology referral.   

## 2017-03-07 NOTE — Telephone Encounter (Signed)
Can refer to neurology

## 2017-03-07 NOTE — Telephone Encounter (Signed)
Pt called wanting to know if you can recommend a specialist for her to see for her restless leg syndrome.  Her call back is [phone]  Thanks Con Memos

## 2017-03-07 NOTE — Telephone Encounter (Signed)
Please advise 

## 2017-03-10 ENCOUNTER — Telehealth: Payer: Self-pay

## 2017-03-10 NOTE — Telephone Encounter (Signed)
PT CALLED STATES THAT SHE NEEDS TO SPEAK WITH DR. Einar Grad. PT STATES THAT DR. RAVI TOLD HER THAT SHE COULD NOT SEE PATIENT ANYMORE. PT STATES THAT SHE GOING TO TRY AND COME BY OFFICE ON MONDAY AND DROP OFF SOMETHING FOR DR. Einar Grad.  PT WANTS DR. RAVI TO CALL HER.  [place]. RAVI KNOWS WHAT IT ABOUT.

## 2017-03-10 NOTE — Telephone Encounter (Signed)
Patient can make another appointment with me after which she will transition to dr.Eappen

## 2017-03-14 ENCOUNTER — Ambulatory Visit: Payer: Self-pay | Admitting: Podiatry

## 2017-03-14 ENCOUNTER — Other Ambulatory Visit: Payer: Self-pay | Admitting: Family Medicine

## 2017-03-14 MED ORDER — TRAMADOL HCL 50 MG PO TABS: 100.0000 mg | ORAL_TABLET | Freq: Two times a day (BID) | ORAL | 3 refills | Status: DC

## 2017-03-14 NOTE — Telephone Encounter (Signed)
Please advise 

## 2017-03-14 NOTE — Telephone Encounter (Signed)
Pt wants to have an RX of Tramadol sent into [facility] on [address] stating she is able to take 2 per day as needed.  Her current RX states one per day.  Pt states Dr. Caryn Section told her she could take two as needed in the office and she has a flair up and using two and will run out due to this.

## 2017-03-14 NOTE — Telephone Encounter (Signed)
Please call in tramadol.  

## 2017-03-14 NOTE — Telephone Encounter (Signed)
Rx called in to pharmacy. 

## 2017-03-16 ENCOUNTER — Telehealth: Payer: Self-pay | Admitting: Family Medicine

## 2017-03-17 ENCOUNTER — Ambulatory Visit: Payer: Self-pay | Admitting: Podiatry

## 2017-03-21 ENCOUNTER — Ambulatory Visit: Payer: Self-pay | Admitting: Podiatry

## 2017-03-22 DIAGNOSIS — E538 Deficiency of other specified B group vitamins: Secondary | ICD-10-CM | POA: Diagnosis not present

## 2017-03-22 DIAGNOSIS — M79604 Pain in right leg: Secondary | ICD-10-CM | POA: Diagnosis not present

## 2017-03-22 DIAGNOSIS — R29898 Other symptoms and signs involving the musculoskeletal system: Secondary | ICD-10-CM | POA: Diagnosis not present

## 2017-03-22 DIAGNOSIS — M79605 Pain in left leg: Secondary | ICD-10-CM | POA: Diagnosis not present

## 2017-03-22 DIAGNOSIS — E559 Vitamin D deficiency, unspecified: Secondary | ICD-10-CM | POA: Diagnosis not present

## 2017-03-22 DIAGNOSIS — M4807 Spinal stenosis, lumbosacral region: Secondary | ICD-10-CM | POA: Diagnosis not present

## 2017-03-24 ENCOUNTER — Ambulatory Visit (INDEPENDENT_AMBULATORY_CARE_PROVIDER_SITE_OTHER): Payer: Medicare HMO

## 2017-03-24 ENCOUNTER — Ambulatory Visit: Payer: Self-pay | Admitting: Podiatry

## 2017-03-24 DIAGNOSIS — Z23 Encounter for immunization: Secondary | ICD-10-CM

## 2017-03-30 DIAGNOSIS — J449 Chronic obstructive pulmonary disease, unspecified: Secondary | ICD-10-CM | POA: Diagnosis not present

## 2017-03-30 DIAGNOSIS — F209 Schizophrenia, unspecified: Secondary | ICD-10-CM | POA: Diagnosis not present

## 2017-03-30 DIAGNOSIS — M5116 Intervertebral disc disorders with radiculopathy, lumbar region: Secondary | ICD-10-CM | POA: Diagnosis not present

## 2017-03-30 DIAGNOSIS — G629 Polyneuropathy, unspecified: Secondary | ICD-10-CM | POA: Diagnosis not present

## 2017-03-30 DIAGNOSIS — M48062 Spinal stenosis, lumbar region with neurogenic claudication: Secondary | ICD-10-CM | POA: Diagnosis not present

## 2017-03-30 DIAGNOSIS — I10 Essential (primary) hypertension: Secondary | ICD-10-CM | POA: Diagnosis not present

## 2017-03-30 DIAGNOSIS — M1990 Unspecified osteoarthritis, unspecified site: Secondary | ICD-10-CM | POA: Diagnosis not present

## 2017-03-30 DIAGNOSIS — F329 Major depressive disorder, single episode, unspecified: Secondary | ICD-10-CM | POA: Diagnosis not present

## 2017-03-30 DIAGNOSIS — G2581 Restless legs syndrome: Secondary | ICD-10-CM | POA: Diagnosis not present

## 2017-03-31 ENCOUNTER — Ambulatory Visit (INDEPENDENT_AMBULATORY_CARE_PROVIDER_SITE_OTHER): Payer: Medicare HMO | Admitting: Podiatry

## 2017-03-31 ENCOUNTER — Encounter: Payer: Self-pay | Admitting: Podiatry

## 2017-03-31 DIAGNOSIS — B351 Tinea unguium: Secondary | ICD-10-CM | POA: Diagnosis not present

## 2017-03-31 DIAGNOSIS — M79676 Pain in unspecified toe(s): Secondary | ICD-10-CM | POA: Diagnosis not present

## 2017-03-31 NOTE — Progress Notes (Signed)
Complaint:  Visit Type: Patient returns to my office for continued preventative foot care services. Complaint: Patient states" my nails have grown long and thick and become painful to walk and wear shoes" Patient has been diagnosed with DM with neuropathy. The patient presents for preventative foot care services. No changes to ROS.  She says she wants to discuss permanent removal of toenail right big toe.  Podiatric Exam: Vascular: dorsalis pedis and posterior tibial pulses are palpable bilateral. Capillary return is immediate. Temperature gradient is WNL. Skin turgor WNL  Sensorium: Normal Semmes Weinstein monofilament test. Normal tactile sensation bilaterally. Nail Exam: Pt has thick disfigured discolored nails with subungual debris noted bilateral entire nail hallux through fifth toenails Ulcer Exam: There is no evidence of ulcer or pre-ulcerative changes or infection. Orthopedic Exam: Muscle tone and strength are WNL. No limitations in general ROM. No crepitus or effusions noted. Foot type and digits show no abnormalities. Bony prominences are unremarkable. Skin: Asymptomatic   Porokeratosis  Hallux  B/L. No infection or ulcers  Diagnosis:  Onychomycosis, , Pain in right toe, pain in left toes  Treatment & Plan Procedures and Treatment: Consent by patient was obtained for treatment procedures.   Debridement of mycotic and hypertrophic toenails, 1 through 5 bilateral and clearing of subungual debris. No ulceration, no infection noted.  Return Visit-Office Procedure: Patient instructed to return to the office for a follow up visit 3 months for continued evaluation and treatment. Discussed the removal of the great toenail with this patient and I recommended she allow me to perform preventative foot care services and at her next visit we can then discuss permanent removal if needed.      Gardiner Barefoot DPM

## 2017-04-01 DIAGNOSIS — J449 Chronic obstructive pulmonary disease, unspecified: Secondary | ICD-10-CM | POA: Diagnosis not present

## 2017-04-01 DIAGNOSIS — I10 Essential (primary) hypertension: Secondary | ICD-10-CM | POA: Diagnosis not present

## 2017-04-01 DIAGNOSIS — M1990 Unspecified osteoarthritis, unspecified site: Secondary | ICD-10-CM | POA: Diagnosis not present

## 2017-04-01 DIAGNOSIS — F209 Schizophrenia, unspecified: Secondary | ICD-10-CM | POA: Diagnosis not present

## 2017-04-01 DIAGNOSIS — M5116 Intervertebral disc disorders with radiculopathy, lumbar region: Secondary | ICD-10-CM | POA: Diagnosis not present

## 2017-04-01 DIAGNOSIS — M48062 Spinal stenosis, lumbar region with neurogenic claudication: Secondary | ICD-10-CM | POA: Diagnosis not present

## 2017-04-01 DIAGNOSIS — G629 Polyneuropathy, unspecified: Secondary | ICD-10-CM | POA: Diagnosis not present

## 2017-04-01 DIAGNOSIS — G2581 Restless legs syndrome: Secondary | ICD-10-CM | POA: Diagnosis not present

## 2017-04-01 DIAGNOSIS — F329 Major depressive disorder, single episode, unspecified: Secondary | ICD-10-CM | POA: Diagnosis not present

## 2017-04-04 DIAGNOSIS — G629 Polyneuropathy, unspecified: Secondary | ICD-10-CM | POA: Diagnosis not present

## 2017-04-04 DIAGNOSIS — J449 Chronic obstructive pulmonary disease, unspecified: Secondary | ICD-10-CM | POA: Diagnosis not present

## 2017-04-04 DIAGNOSIS — F209 Schizophrenia, unspecified: Secondary | ICD-10-CM | POA: Diagnosis not present

## 2017-04-04 DIAGNOSIS — M5116 Intervertebral disc disorders with radiculopathy, lumbar region: Secondary | ICD-10-CM | POA: Diagnosis not present

## 2017-04-04 DIAGNOSIS — G2581 Restless legs syndrome: Secondary | ICD-10-CM | POA: Diagnosis not present

## 2017-04-04 DIAGNOSIS — I10 Essential (primary) hypertension: Secondary | ICD-10-CM | POA: Diagnosis not present

## 2017-04-04 DIAGNOSIS — F329 Major depressive disorder, single episode, unspecified: Secondary | ICD-10-CM | POA: Diagnosis not present

## 2017-04-04 DIAGNOSIS — M48062 Spinal stenosis, lumbar region with neurogenic claudication: Secondary | ICD-10-CM | POA: Diagnosis not present

## 2017-04-04 DIAGNOSIS — M1990 Unspecified osteoarthritis, unspecified site: Secondary | ICD-10-CM | POA: Diagnosis not present

## 2017-04-06 DIAGNOSIS — F329 Major depressive disorder, single episode, unspecified: Secondary | ICD-10-CM | POA: Diagnosis not present

## 2017-04-06 DIAGNOSIS — I10 Essential (primary) hypertension: Secondary | ICD-10-CM | POA: Diagnosis not present

## 2017-04-06 DIAGNOSIS — G629 Polyneuropathy, unspecified: Secondary | ICD-10-CM | POA: Diagnosis not present

## 2017-04-06 DIAGNOSIS — J449 Chronic obstructive pulmonary disease, unspecified: Secondary | ICD-10-CM | POA: Diagnosis not present

## 2017-04-06 DIAGNOSIS — F209 Schizophrenia, unspecified: Secondary | ICD-10-CM | POA: Diagnosis not present

## 2017-04-06 DIAGNOSIS — M1990 Unspecified osteoarthritis, unspecified site: Secondary | ICD-10-CM | POA: Diagnosis not present

## 2017-04-06 DIAGNOSIS — M48062 Spinal stenosis, lumbar region with neurogenic claudication: Secondary | ICD-10-CM | POA: Diagnosis not present

## 2017-04-06 DIAGNOSIS — G2581 Restless legs syndrome: Secondary | ICD-10-CM | POA: Diagnosis not present

## 2017-04-06 DIAGNOSIS — M5116 Intervertebral disc disorders with radiculopathy, lumbar region: Secondary | ICD-10-CM | POA: Diagnosis not present

## 2017-04-08 DIAGNOSIS — J449 Chronic obstructive pulmonary disease, unspecified: Secondary | ICD-10-CM | POA: Diagnosis not present

## 2017-04-08 DIAGNOSIS — M1990 Unspecified osteoarthritis, unspecified site: Secondary | ICD-10-CM | POA: Diagnosis not present

## 2017-04-08 DIAGNOSIS — M48062 Spinal stenosis, lumbar region with neurogenic claudication: Secondary | ICD-10-CM | POA: Diagnosis not present

## 2017-04-08 DIAGNOSIS — I10 Essential (primary) hypertension: Secondary | ICD-10-CM | POA: Diagnosis not present

## 2017-04-08 DIAGNOSIS — M5116 Intervertebral disc disorders with radiculopathy, lumbar region: Secondary | ICD-10-CM | POA: Diagnosis not present

## 2017-04-08 DIAGNOSIS — F209 Schizophrenia, unspecified: Secondary | ICD-10-CM | POA: Diagnosis not present

## 2017-04-08 DIAGNOSIS — F329 Major depressive disorder, single episode, unspecified: Secondary | ICD-10-CM | POA: Diagnosis not present

## 2017-04-08 DIAGNOSIS — G2581 Restless legs syndrome: Secondary | ICD-10-CM | POA: Diagnosis not present

## 2017-04-08 DIAGNOSIS — G629 Polyneuropathy, unspecified: Secondary | ICD-10-CM | POA: Diagnosis not present

## 2017-04-11 DIAGNOSIS — M48062 Spinal stenosis, lumbar region with neurogenic claudication: Secondary | ICD-10-CM | POA: Diagnosis not present

## 2017-04-11 DIAGNOSIS — M5116 Intervertebral disc disorders with radiculopathy, lumbar region: Secondary | ICD-10-CM | POA: Diagnosis not present

## 2017-04-11 DIAGNOSIS — G2581 Restless legs syndrome: Secondary | ICD-10-CM | POA: Diagnosis not present

## 2017-04-11 DIAGNOSIS — F209 Schizophrenia, unspecified: Secondary | ICD-10-CM | POA: Diagnosis not present

## 2017-04-11 DIAGNOSIS — M1990 Unspecified osteoarthritis, unspecified site: Secondary | ICD-10-CM | POA: Diagnosis not present

## 2017-04-11 DIAGNOSIS — I10 Essential (primary) hypertension: Secondary | ICD-10-CM | POA: Diagnosis not present

## 2017-04-11 DIAGNOSIS — F329 Major depressive disorder, single episode, unspecified: Secondary | ICD-10-CM | POA: Diagnosis not present

## 2017-04-11 DIAGNOSIS — G629 Polyneuropathy, unspecified: Secondary | ICD-10-CM | POA: Diagnosis not present

## 2017-04-11 DIAGNOSIS — J449 Chronic obstructive pulmonary disease, unspecified: Secondary | ICD-10-CM | POA: Diagnosis not present

## 2017-04-14 ENCOUNTER — Inpatient Hospital Stay: Payer: Medicare HMO | Admitting: Hematology and Oncology

## 2017-04-14 DIAGNOSIS — F209 Schizophrenia, unspecified: Secondary | ICD-10-CM | POA: Diagnosis not present

## 2017-04-14 DIAGNOSIS — G629 Polyneuropathy, unspecified: Secondary | ICD-10-CM | POA: Diagnosis not present

## 2017-04-14 DIAGNOSIS — M5116 Intervertebral disc disorders with radiculopathy, lumbar region: Secondary | ICD-10-CM | POA: Diagnosis not present

## 2017-04-14 DIAGNOSIS — G2581 Restless legs syndrome: Secondary | ICD-10-CM | POA: Diagnosis not present

## 2017-04-14 DIAGNOSIS — I10 Essential (primary) hypertension: Secondary | ICD-10-CM | POA: Diagnosis not present

## 2017-04-14 DIAGNOSIS — F329 Major depressive disorder, single episode, unspecified: Secondary | ICD-10-CM | POA: Diagnosis not present

## 2017-04-14 DIAGNOSIS — M1990 Unspecified osteoarthritis, unspecified site: Secondary | ICD-10-CM | POA: Diagnosis not present

## 2017-04-14 DIAGNOSIS — J449 Chronic obstructive pulmonary disease, unspecified: Secondary | ICD-10-CM | POA: Diagnosis not present

## 2017-04-14 DIAGNOSIS — M48062 Spinal stenosis, lumbar region with neurogenic claudication: Secondary | ICD-10-CM | POA: Diagnosis not present

## 2017-04-15 DIAGNOSIS — G2581 Restless legs syndrome: Secondary | ICD-10-CM | POA: Diagnosis not present

## 2017-04-15 DIAGNOSIS — F329 Major depressive disorder, single episode, unspecified: Secondary | ICD-10-CM | POA: Diagnosis not present

## 2017-04-15 DIAGNOSIS — G629 Polyneuropathy, unspecified: Secondary | ICD-10-CM | POA: Diagnosis not present

## 2017-04-15 DIAGNOSIS — M1990 Unspecified osteoarthritis, unspecified site: Secondary | ICD-10-CM | POA: Diagnosis not present

## 2017-04-15 DIAGNOSIS — I10 Essential (primary) hypertension: Secondary | ICD-10-CM | POA: Diagnosis not present

## 2017-04-15 DIAGNOSIS — J449 Chronic obstructive pulmonary disease, unspecified: Secondary | ICD-10-CM | POA: Diagnosis not present

## 2017-04-15 DIAGNOSIS — M5116 Intervertebral disc disorders with radiculopathy, lumbar region: Secondary | ICD-10-CM | POA: Diagnosis not present

## 2017-04-15 DIAGNOSIS — M48062 Spinal stenosis, lumbar region with neurogenic claudication: Secondary | ICD-10-CM | POA: Diagnosis not present

## 2017-04-15 DIAGNOSIS — F209 Schizophrenia, unspecified: Secondary | ICD-10-CM | POA: Diagnosis not present

## 2017-04-16 DIAGNOSIS — M48062 Spinal stenosis, lumbar region with neurogenic claudication: Secondary | ICD-10-CM | POA: Diagnosis not present

## 2017-04-16 DIAGNOSIS — M5116 Intervertebral disc disorders with radiculopathy, lumbar region: Secondary | ICD-10-CM | POA: Diagnosis not present

## 2017-04-16 DIAGNOSIS — G2581 Restless legs syndrome: Secondary | ICD-10-CM | POA: Diagnosis not present

## 2017-04-16 DIAGNOSIS — J449 Chronic obstructive pulmonary disease, unspecified: Secondary | ICD-10-CM | POA: Diagnosis not present

## 2017-04-16 DIAGNOSIS — M1990 Unspecified osteoarthritis, unspecified site: Secondary | ICD-10-CM | POA: Diagnosis not present

## 2017-04-16 DIAGNOSIS — G629 Polyneuropathy, unspecified: Secondary | ICD-10-CM | POA: Diagnosis not present

## 2017-04-16 DIAGNOSIS — F329 Major depressive disorder, single episode, unspecified: Secondary | ICD-10-CM | POA: Diagnosis not present

## 2017-04-16 DIAGNOSIS — F209 Schizophrenia, unspecified: Secondary | ICD-10-CM | POA: Diagnosis not present

## 2017-04-16 DIAGNOSIS — I10 Essential (primary) hypertension: Secondary | ICD-10-CM | POA: Diagnosis not present

## 2017-04-18 DIAGNOSIS — M5116 Intervertebral disc disorders with radiculopathy, lumbar region: Secondary | ICD-10-CM | POA: Diagnosis not present

## 2017-04-18 DIAGNOSIS — I10 Essential (primary) hypertension: Secondary | ICD-10-CM | POA: Diagnosis not present

## 2017-04-18 DIAGNOSIS — M48062 Spinal stenosis, lumbar region with neurogenic claudication: Secondary | ICD-10-CM | POA: Diagnosis not present

## 2017-04-18 DIAGNOSIS — M1990 Unspecified osteoarthritis, unspecified site: Secondary | ICD-10-CM | POA: Diagnosis not present

## 2017-04-18 DIAGNOSIS — J449 Chronic obstructive pulmonary disease, unspecified: Secondary | ICD-10-CM | POA: Diagnosis not present

## 2017-04-18 DIAGNOSIS — F329 Major depressive disorder, single episode, unspecified: Secondary | ICD-10-CM | POA: Diagnosis not present

## 2017-04-18 DIAGNOSIS — F209 Schizophrenia, unspecified: Secondary | ICD-10-CM | POA: Diagnosis not present

## 2017-04-18 DIAGNOSIS — G2581 Restless legs syndrome: Secondary | ICD-10-CM | POA: Diagnosis not present

## 2017-04-18 DIAGNOSIS — G629 Polyneuropathy, unspecified: Secondary | ICD-10-CM | POA: Diagnosis not present

## 2017-04-20 DIAGNOSIS — M48062 Spinal stenosis, lumbar region with neurogenic claudication: Secondary | ICD-10-CM | POA: Diagnosis not present

## 2017-04-20 DIAGNOSIS — G629 Polyneuropathy, unspecified: Secondary | ICD-10-CM | POA: Diagnosis not present

## 2017-04-20 DIAGNOSIS — J449 Chronic obstructive pulmonary disease, unspecified: Secondary | ICD-10-CM | POA: Diagnosis not present

## 2017-04-20 DIAGNOSIS — I10 Essential (primary) hypertension: Secondary | ICD-10-CM | POA: Diagnosis not present

## 2017-04-20 DIAGNOSIS — M5116 Intervertebral disc disorders with radiculopathy, lumbar region: Secondary | ICD-10-CM | POA: Diagnosis not present

## 2017-04-20 DIAGNOSIS — F329 Major depressive disorder, single episode, unspecified: Secondary | ICD-10-CM | POA: Diagnosis not present

## 2017-04-20 DIAGNOSIS — F209 Schizophrenia, unspecified: Secondary | ICD-10-CM | POA: Diagnosis not present

## 2017-04-20 DIAGNOSIS — G2581 Restless legs syndrome: Secondary | ICD-10-CM | POA: Diagnosis not present

## 2017-04-20 DIAGNOSIS — M1990 Unspecified osteoarthritis, unspecified site: Secondary | ICD-10-CM | POA: Diagnosis not present

## 2017-04-21 ENCOUNTER — Inpatient Hospital Stay: Payer: Medicare HMO | Attending: Hematology and Oncology | Admitting: Oncology

## 2017-04-22 DIAGNOSIS — F329 Major depressive disorder, single episode, unspecified: Secondary | ICD-10-CM | POA: Diagnosis not present

## 2017-04-22 DIAGNOSIS — G2581 Restless legs syndrome: Secondary | ICD-10-CM | POA: Diagnosis not present

## 2017-04-22 DIAGNOSIS — M1990 Unspecified osteoarthritis, unspecified site: Secondary | ICD-10-CM | POA: Diagnosis not present

## 2017-04-22 DIAGNOSIS — J449 Chronic obstructive pulmonary disease, unspecified: Secondary | ICD-10-CM | POA: Diagnosis not present

## 2017-04-22 DIAGNOSIS — I10 Essential (primary) hypertension: Secondary | ICD-10-CM | POA: Diagnosis not present

## 2017-04-22 DIAGNOSIS — F209 Schizophrenia, unspecified: Secondary | ICD-10-CM | POA: Diagnosis not present

## 2017-04-22 DIAGNOSIS — M48062 Spinal stenosis, lumbar region with neurogenic claudication: Secondary | ICD-10-CM | POA: Diagnosis not present

## 2017-04-22 DIAGNOSIS — G629 Polyneuropathy, unspecified: Secondary | ICD-10-CM | POA: Diagnosis not present

## 2017-04-22 DIAGNOSIS — M5116 Intervertebral disc disorders with radiculopathy, lumbar region: Secondary | ICD-10-CM | POA: Diagnosis not present

## 2017-04-24 DIAGNOSIS — M1990 Unspecified osteoarthritis, unspecified site: Secondary | ICD-10-CM | POA: Diagnosis not present

## 2017-04-24 DIAGNOSIS — F329 Major depressive disorder, single episode, unspecified: Secondary | ICD-10-CM | POA: Diagnosis not present

## 2017-04-24 DIAGNOSIS — I10 Essential (primary) hypertension: Secondary | ICD-10-CM | POA: Diagnosis not present

## 2017-04-24 DIAGNOSIS — J449 Chronic obstructive pulmonary disease, unspecified: Secondary | ICD-10-CM | POA: Diagnosis not present

## 2017-04-24 DIAGNOSIS — G2581 Restless legs syndrome: Secondary | ICD-10-CM | POA: Diagnosis not present

## 2017-04-24 DIAGNOSIS — M48062 Spinal stenosis, lumbar region with neurogenic claudication: Secondary | ICD-10-CM | POA: Diagnosis not present

## 2017-04-24 DIAGNOSIS — M5116 Intervertebral disc disorders with radiculopathy, lumbar region: Secondary | ICD-10-CM | POA: Diagnosis not present

## 2017-04-24 DIAGNOSIS — F209 Schizophrenia, unspecified: Secondary | ICD-10-CM | POA: Diagnosis not present

## 2017-04-24 DIAGNOSIS — G629 Polyneuropathy, unspecified: Secondary | ICD-10-CM | POA: Diagnosis not present

## 2017-04-25 DIAGNOSIS — I10 Essential (primary) hypertension: Secondary | ICD-10-CM | POA: Diagnosis not present

## 2017-04-25 DIAGNOSIS — M48062 Spinal stenosis, lumbar region with neurogenic claudication: Secondary | ICD-10-CM | POA: Diagnosis not present

## 2017-04-25 DIAGNOSIS — F329 Major depressive disorder, single episode, unspecified: Secondary | ICD-10-CM | POA: Diagnosis not present

## 2017-04-25 DIAGNOSIS — M1990 Unspecified osteoarthritis, unspecified site: Secondary | ICD-10-CM | POA: Diagnosis not present

## 2017-04-25 DIAGNOSIS — J449 Chronic obstructive pulmonary disease, unspecified: Secondary | ICD-10-CM | POA: Diagnosis not present

## 2017-04-25 DIAGNOSIS — F209 Schizophrenia, unspecified: Secondary | ICD-10-CM | POA: Diagnosis not present

## 2017-04-25 DIAGNOSIS — G2581 Restless legs syndrome: Secondary | ICD-10-CM | POA: Diagnosis not present

## 2017-04-25 DIAGNOSIS — G629 Polyneuropathy, unspecified: Secondary | ICD-10-CM | POA: Diagnosis not present

## 2017-04-25 DIAGNOSIS — M5116 Intervertebral disc disorders with radiculopathy, lumbar region: Secondary | ICD-10-CM | POA: Diagnosis not present

## 2017-04-26 ENCOUNTER — Other Ambulatory Visit: Payer: Self-pay | Admitting: Family Medicine

## 2017-04-26 DIAGNOSIS — M48062 Spinal stenosis, lumbar region with neurogenic claudication: Secondary | ICD-10-CM | POA: Diagnosis not present

## 2017-04-26 DIAGNOSIS — F209 Schizophrenia, unspecified: Secondary | ICD-10-CM | POA: Diagnosis not present

## 2017-04-26 DIAGNOSIS — G629 Polyneuropathy, unspecified: Secondary | ICD-10-CM | POA: Diagnosis not present

## 2017-04-26 DIAGNOSIS — M5116 Intervertebral disc disorders with radiculopathy, lumbar region: Secondary | ICD-10-CM | POA: Diagnosis not present

## 2017-04-26 DIAGNOSIS — M1990 Unspecified osteoarthritis, unspecified site: Secondary | ICD-10-CM | POA: Diagnosis not present

## 2017-04-26 DIAGNOSIS — I10 Essential (primary) hypertension: Secondary | ICD-10-CM | POA: Diagnosis not present

## 2017-04-26 DIAGNOSIS — G2581 Restless legs syndrome: Secondary | ICD-10-CM | POA: Diagnosis not present

## 2017-04-26 DIAGNOSIS — J449 Chronic obstructive pulmonary disease, unspecified: Secondary | ICD-10-CM | POA: Diagnosis not present

## 2017-04-26 DIAGNOSIS — F329 Major depressive disorder, single episode, unspecified: Secondary | ICD-10-CM | POA: Diagnosis not present

## 2017-04-27 DIAGNOSIS — M5116 Intervertebral disc disorders with radiculopathy, lumbar region: Secondary | ICD-10-CM | POA: Diagnosis not present

## 2017-04-27 DIAGNOSIS — M48062 Spinal stenosis, lumbar region with neurogenic claudication: Secondary | ICD-10-CM | POA: Diagnosis not present

## 2017-04-27 DIAGNOSIS — F209 Schizophrenia, unspecified: Secondary | ICD-10-CM | POA: Diagnosis not present

## 2017-04-27 DIAGNOSIS — J449 Chronic obstructive pulmonary disease, unspecified: Secondary | ICD-10-CM | POA: Diagnosis not present

## 2017-04-27 DIAGNOSIS — F329 Major depressive disorder, single episode, unspecified: Secondary | ICD-10-CM | POA: Diagnosis not present

## 2017-04-27 DIAGNOSIS — G629 Polyneuropathy, unspecified: Secondary | ICD-10-CM | POA: Diagnosis not present

## 2017-04-27 DIAGNOSIS — G2581 Restless legs syndrome: Secondary | ICD-10-CM | POA: Diagnosis not present

## 2017-04-27 DIAGNOSIS — M1990 Unspecified osteoarthritis, unspecified site: Secondary | ICD-10-CM | POA: Diagnosis not present

## 2017-04-27 DIAGNOSIS — I10 Essential (primary) hypertension: Secondary | ICD-10-CM | POA: Diagnosis not present

## 2017-05-01 ENCOUNTER — Other Ambulatory Visit: Payer: Self-pay | Admitting: Family Medicine

## 2017-05-01 DIAGNOSIS — M549 Dorsalgia, unspecified: Secondary | ICD-10-CM

## 2017-05-02 DIAGNOSIS — M5116 Intervertebral disc disorders with radiculopathy, lumbar region: Secondary | ICD-10-CM | POA: Diagnosis not present

## 2017-05-02 DIAGNOSIS — M48062 Spinal stenosis, lumbar region with neurogenic claudication: Secondary | ICD-10-CM | POA: Diagnosis not present

## 2017-05-02 DIAGNOSIS — G629 Polyneuropathy, unspecified: Secondary | ICD-10-CM | POA: Diagnosis not present

## 2017-05-02 DIAGNOSIS — I10 Essential (primary) hypertension: Secondary | ICD-10-CM | POA: Diagnosis not present

## 2017-05-02 DIAGNOSIS — M1990 Unspecified osteoarthritis, unspecified site: Secondary | ICD-10-CM | POA: Diagnosis not present

## 2017-05-02 DIAGNOSIS — J449 Chronic obstructive pulmonary disease, unspecified: Secondary | ICD-10-CM | POA: Diagnosis not present

## 2017-05-02 DIAGNOSIS — F329 Major depressive disorder, single episode, unspecified: Secondary | ICD-10-CM | POA: Diagnosis not present

## 2017-05-02 DIAGNOSIS — G2581 Restless legs syndrome: Secondary | ICD-10-CM | POA: Diagnosis not present

## 2017-05-02 DIAGNOSIS — F209 Schizophrenia, unspecified: Secondary | ICD-10-CM | POA: Diagnosis not present

## 2017-05-04 DIAGNOSIS — G2581 Restless legs syndrome: Secondary | ICD-10-CM | POA: Diagnosis not present

## 2017-05-04 DIAGNOSIS — M5116 Intervertebral disc disorders with radiculopathy, lumbar region: Secondary | ICD-10-CM | POA: Diagnosis not present

## 2017-05-04 DIAGNOSIS — G629 Polyneuropathy, unspecified: Secondary | ICD-10-CM | POA: Diagnosis not present

## 2017-05-04 DIAGNOSIS — M1990 Unspecified osteoarthritis, unspecified site: Secondary | ICD-10-CM | POA: Diagnosis not present

## 2017-05-04 DIAGNOSIS — I10 Essential (primary) hypertension: Secondary | ICD-10-CM | POA: Diagnosis not present

## 2017-05-04 DIAGNOSIS — M48062 Spinal stenosis, lumbar region with neurogenic claudication: Secondary | ICD-10-CM | POA: Diagnosis not present

## 2017-05-04 DIAGNOSIS — F209 Schizophrenia, unspecified: Secondary | ICD-10-CM | POA: Diagnosis not present

## 2017-05-04 DIAGNOSIS — F329 Major depressive disorder, single episode, unspecified: Secondary | ICD-10-CM | POA: Diagnosis not present

## 2017-05-04 DIAGNOSIS — J449 Chronic obstructive pulmonary disease, unspecified: Secondary | ICD-10-CM | POA: Diagnosis not present

## 2017-05-08 ENCOUNTER — Other Ambulatory Visit: Payer: Self-pay | Admitting: Family Medicine

## 2017-05-09 DIAGNOSIS — M48062 Spinal stenosis, lumbar region with neurogenic claudication: Secondary | ICD-10-CM | POA: Diagnosis not present

## 2017-05-09 DIAGNOSIS — M5116 Intervertebral disc disorders with radiculopathy, lumbar region: Secondary | ICD-10-CM | POA: Diagnosis not present

## 2017-05-09 DIAGNOSIS — M1990 Unspecified osteoarthritis, unspecified site: Secondary | ICD-10-CM | POA: Diagnosis not present

## 2017-05-09 DIAGNOSIS — G629 Polyneuropathy, unspecified: Secondary | ICD-10-CM | POA: Diagnosis not present

## 2017-05-09 DIAGNOSIS — F329 Major depressive disorder, single episode, unspecified: Secondary | ICD-10-CM | POA: Diagnosis not present

## 2017-05-09 DIAGNOSIS — J449 Chronic obstructive pulmonary disease, unspecified: Secondary | ICD-10-CM | POA: Diagnosis not present

## 2017-05-09 DIAGNOSIS — G2581 Restless legs syndrome: Secondary | ICD-10-CM | POA: Diagnosis not present

## 2017-05-09 DIAGNOSIS — F209 Schizophrenia, unspecified: Secondary | ICD-10-CM | POA: Diagnosis not present

## 2017-05-09 DIAGNOSIS — I10 Essential (primary) hypertension: Secondary | ICD-10-CM | POA: Diagnosis not present

## 2017-05-10 ENCOUNTER — Ambulatory Visit: Payer: Medicare HMO | Admitting: Psychiatry

## 2017-05-10 DIAGNOSIS — J449 Chronic obstructive pulmonary disease, unspecified: Secondary | ICD-10-CM | POA: Diagnosis not present

## 2017-05-10 DIAGNOSIS — M1990 Unspecified osteoarthritis, unspecified site: Secondary | ICD-10-CM | POA: Diagnosis not present

## 2017-05-10 DIAGNOSIS — F209 Schizophrenia, unspecified: Secondary | ICD-10-CM | POA: Diagnosis not present

## 2017-05-10 DIAGNOSIS — M5116 Intervertebral disc disorders with radiculopathy, lumbar region: Secondary | ICD-10-CM | POA: Diagnosis not present

## 2017-05-10 DIAGNOSIS — M48062 Spinal stenosis, lumbar region with neurogenic claudication: Secondary | ICD-10-CM | POA: Diagnosis not present

## 2017-05-10 DIAGNOSIS — F329 Major depressive disorder, single episode, unspecified: Secondary | ICD-10-CM | POA: Diagnosis not present

## 2017-05-10 DIAGNOSIS — I10 Essential (primary) hypertension: Secondary | ICD-10-CM | POA: Diagnosis not present

## 2017-05-10 DIAGNOSIS — G629 Polyneuropathy, unspecified: Secondary | ICD-10-CM | POA: Diagnosis not present

## 2017-05-10 DIAGNOSIS — G2581 Restless legs syndrome: Secondary | ICD-10-CM | POA: Diagnosis not present

## 2017-05-11 DIAGNOSIS — G629 Polyneuropathy, unspecified: Secondary | ICD-10-CM | POA: Diagnosis not present

## 2017-05-11 DIAGNOSIS — M1990 Unspecified osteoarthritis, unspecified site: Secondary | ICD-10-CM | POA: Diagnosis not present

## 2017-05-11 DIAGNOSIS — M5116 Intervertebral disc disorders with radiculopathy, lumbar region: Secondary | ICD-10-CM | POA: Diagnosis not present

## 2017-05-11 DIAGNOSIS — M48062 Spinal stenosis, lumbar region with neurogenic claudication: Secondary | ICD-10-CM | POA: Diagnosis not present

## 2017-05-11 DIAGNOSIS — F209 Schizophrenia, unspecified: Secondary | ICD-10-CM | POA: Diagnosis not present

## 2017-05-11 DIAGNOSIS — I10 Essential (primary) hypertension: Secondary | ICD-10-CM | POA: Diagnosis not present

## 2017-05-11 DIAGNOSIS — F329 Major depressive disorder, single episode, unspecified: Secondary | ICD-10-CM | POA: Diagnosis not present

## 2017-05-11 DIAGNOSIS — G2581 Restless legs syndrome: Secondary | ICD-10-CM | POA: Diagnosis not present

## 2017-05-11 DIAGNOSIS — J449 Chronic obstructive pulmonary disease, unspecified: Secondary | ICD-10-CM | POA: Diagnosis not present

## 2017-05-12 ENCOUNTER — Inpatient Hospital Stay: Payer: Medicare HMO

## 2017-05-12 ENCOUNTER — Encounter: Payer: Self-pay | Admitting: Oncology

## 2017-05-12 ENCOUNTER — Inpatient Hospital Stay: Payer: Medicare HMO | Attending: Oncology | Admitting: Oncology

## 2017-05-12 VITALS — BP 164/83 | HR 78 | Temp 97.5°F | Resp 16 | Wt 232.3 lb

## 2017-05-12 DIAGNOSIS — K219 Gastro-esophageal reflux disease without esophagitis: Secondary | ICD-10-CM | POA: Diagnosis not present

## 2017-05-12 DIAGNOSIS — F329 Major depressive disorder, single episode, unspecified: Secondary | ICD-10-CM | POA: Insufficient documentation

## 2017-05-12 DIAGNOSIS — G2581 Restless legs syndrome: Secondary | ICD-10-CM | POA: Insufficient documentation

## 2017-05-12 DIAGNOSIS — R771 Abnormality of globulin: Secondary | ICD-10-CM | POA: Insufficient documentation

## 2017-05-12 DIAGNOSIS — Z7982 Long term (current) use of aspirin: Secondary | ICD-10-CM | POA: Diagnosis not present

## 2017-05-12 DIAGNOSIS — Z79899 Other long term (current) drug therapy: Secondary | ICD-10-CM | POA: Diagnosis not present

## 2017-05-12 DIAGNOSIS — Z87891 Personal history of nicotine dependence: Secondary | ICD-10-CM | POA: Insufficient documentation

## 2017-05-12 DIAGNOSIS — I10 Essential (primary) hypertension: Secondary | ICD-10-CM | POA: Diagnosis not present

## 2017-05-12 DIAGNOSIS — J449 Chronic obstructive pulmonary disease, unspecified: Secondary | ICD-10-CM | POA: Diagnosis not present

## 2017-05-12 DIAGNOSIS — F419 Anxiety disorder, unspecified: Secondary | ICD-10-CM | POA: Insufficient documentation

## 2017-05-12 DIAGNOSIS — K449 Diaphragmatic hernia without obstruction or gangrene: Secondary | ICD-10-CM | POA: Insufficient documentation

## 2017-05-12 LAB — COMPREHENSIVE METABOLIC PANEL
ALBUMIN: 3.8 g/dL (ref 3.5–5.0)
ALK PHOS: 92 U/L (ref 38–126)
ALT: 14 U/L (ref 14–54)
AST: 20 U/L (ref 15–41)
Anion gap: 8 (ref 5–15)
BUN: 15 mg/dL (ref 6–20)
CALCIUM: 9.8 mg/dL (ref 8.9–10.3)
CHLORIDE: 101 mmol/L (ref 101–111)
CO2: 26 mmol/L (ref 22–32)
CREATININE: 0.67 mg/dL (ref 0.44–1.00)
GFR calc Af Amer: >60 mL/min (ref >60)
Glucose, Bld: 108 mg/dL — ABNORMAL HIGH (ref 65–99)
Potassium: 4.3 mmol/L (ref 3.5–5.1)
SODIUM: 135 mmol/L (ref 135–145)
Total Bilirubin: 0.7 mg/dL (ref 0.3–1.2)
Total Protein: 6.5 g/dL (ref 6.5–8.1)

## 2017-05-12 LAB — LACTATE DEHYDROGENASE: LDH: 123 U/L (ref 98–192)

## 2017-05-12 LAB — CBC WITH DIFFERENTIAL/PLATELET
BASOS ABS: 0 10*3/uL (ref 0–0.1)
BASOS PCT: 1 %
EOS ABS: 0 10*3/uL (ref 0–0.7)
EOS PCT: 1 %
HCT: 45 % (ref 35.0–47.0)
Hemoglobin: 14.8 g/dL (ref 12.0–16.0)
Lymphocytes Relative: 25 %
Lymphs Abs: 1.2 10*3/uL (ref 1.0–3.6)
MCH: 30.6 pg (ref 26.0–34.0)
MCHC: 32.9 g/dL (ref 32.0–36.0)
MCV: 92.9 fL (ref 80.0–100.0)
Monocytes Absolute: 0.3 10*3/uL (ref 0.2–0.9)
Monocytes Relative: 7 %
NEUTROS ABS: 3.4 10*3/uL (ref 1.4–6.5)
NEUTROS PCT: 66 %
PLATELETS: 201 10*3/uL (ref 150–440)
RBC: 4.84 MIL/uL (ref 3.80–5.20)
RDW: 14.3 % (ref 11.5–14.5)
WBC: 5 10*3/uL (ref 3.6–11.0)

## 2017-05-12 LAB — TSH: TSH: 1.303 u[IU]/mL (ref 0.350–4.500)

## 2017-05-12 NOTE — Progress Notes (Signed)
Patient is here today for a new patient visit.  

## 2017-05-12 NOTE — Progress Notes (Signed)
Hematology/Oncology Consult note [facility] Telephone:([phone] [phone]   Patient Care Team: Birdie Sons, MD as PCP - General (Family Medicine) Dingeldein, Remo Lipps, MD as Consulting Physician (Ophthalmology) Erby Pian, MD as Referring Physician (Specialist) Ubaldo Glassing Javier Docker, MD as Consulting Physician (Cardiology)  REFERRING PROVIDER: Neurology Dr.Shah CHIEF COMPLAINTS/PURPOSE OF CONSULTATION:  hypoglobulinemia  HISTORY OF PRESENTING ILLNESS:  Vicki Morales is a  76 y.o.  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  Patient reports feeling at her baseline. She has weight loss for the past year as she does not eat as much as before. She has chronic SOB with exertion due to emphysema.  She is overdue for age appropriate cancer screen including colonoscopy and mammogram. She says she has so many clinic visits and just want to have a break.     Review of Systems  Constitutional: Negative for fever.  HENT: Negative for hearing loss.   Eyes: Negative for blurred vision.  Respiratory: Negative for cough.   Cardiovascular: Negative for chest pain.  Gastrointestinal: Negative for heartburn.  Genitourinary: Negative for dysuria.  Musculoskeletal: Negative for myalgias.  Skin: Negative for rash.  Neurological: Negative for dizziness.  Endo/Heme/Allergies: Does not bruise/bleed easily.  Psychiatric/Behavioral: Negative for depression.    MEDICAL HISTORY:  Past Medical History:  Diagnosis Date  . Anxiety   . Arthritis   . Cataract    right eye  . COPD (chronic obstructive pulmonary disease) ([place])   . Depression   . Dyspnea    DOE  . Dysrhythmia   . Edema    FEET/ANKLES  . GERD (gastroesophageal reflux disease)   . H/O wheezing   . History of hiatal hernia   . HOH  (hard of hearing)   . Hypertension   . Incontinence of urine in female   . Orthopnea   . Pain    CHRONIC KNEE  . Pain    CHRONIC KNEE  . Paranoid disorder ([place])   . RLS (restless legs syndrome)   . Tremors of nervous system    HANDS    SURGICAL HISTORY: Past Surgical History:  Procedure Laterality Date  . ABDOMINAL HYSTERECTOMY  1988  . APPENDECTOMY  1988  . CARDIAC CATHETERIZATION    . CATARACT EXTRACTION W/PHACO Right 07/14/2016   Procedure: CATARACT EXTRACTION PHACO AND INTRAOCULAR LENS PLACEMENT ([place]) anterior vitrectomy;  Surgeon: Estill Cotta, MD;  Location: [facility] ORS;  Service: Ophthalmology;  Laterality: Right;  [place] 02:40 AP% 24.4 CDE 59.98 Fluid pack # [id number]  . CATARACT EXTRACTION W/PHACO Left 02/16/2017   Procedure: CATARACT EXTRACTION PHACO AND INTRAOCULAR LENS PLACEMENT (IOC);  Surgeon: Estill Cotta, MD;  Location: [facility] ORS;  Service: Ophthalmology;  Laterality: Left;  Lot # [id number] H [place]: 01:11.8 AP%: 23.5 CDE: 32.09   . ESOPHAGOGASTRODUODENOSCOPY N/A 11/11/2014   Procedure: ESOPHAGOGASTRODUODENOSCOPY (EGD);  Surgeon: Josefine Class, MD;  Location: [facility] END[facility];  Service: Endoscopy;  Laterality: N/A;  . TONSILLECTOMY AND ADENOIDECTOMY    . TRIGGER FINGER RELEASE  2004    SOCIAL HISTORY: Social History   Socioeconomic History  . Marital status: Widowed    Spouse name: Not on file  . Number of children: Not on file  . Years of education: Not on file  . Highest education level: Not on file  Social Needs  . Emergency planning/management officer  strain: Not on file  . Food insecurity - worry: Not on file  . Food insecurity - inability: Not on file  . Transportation needs - medical: Not on file  . Transportation needs - non-medical: Not on file  Occupational History  . Not on file  Tobacco Use  . Smoking status: Former Smoker    Years: 30.00  . Smokeless tobacco: Never Used  . Tobacco comment: quit in 1989  Substance and Sexual Activity  . Alcohol use: No     Alcohol/week: 0.0 oz  . Drug use: No  . Sexual activity: Not Currently  Other Topics Concern  . Not on file  Social History Narrative  . Not on file    FAMILY HISTORY: Family History  Problem Relation Age of Onset  . Breast cancer Sister   . Stroke Father   . Emphysema Father   . Anxiety disorder Father   . Depression Father   . Anxiety disorder Brother     ALLERGIES:  is allergic to arthrotec [diclofenac-misoprostol]; codeine; diclofenac; hydrochlorothiazide; penicillins; sulfa antibiotics; tiotropium bromide monohydrate; tylenol [acetaminophen]; morphine; and pantoprazole.  MEDICATIONS:  Current Outpatient Medications  Medication Sig Dispense Refill  . ADVAIR DISKUS 250-50 MCG/DOSE AEPB INHALE 1 PUFF INTO THE LUNGS EVERY 12 HOURS AS NEEDED (Patient taking differently: INHALE 1 PUFF INTO THE LUNGS EVERY 12 HOURS) 1 each 12  . albuterol (PROVENTIL) (2.5 MG/3ML) 0.083% nebulizer solution Take 3 mLs (2.5 mg total) by nebulization 2 (two) times daily as needed for wheezing or shortness of breath. 75 mL 5  . amLODipine (NORVASC) 10 MG tablet TAKE 1 TABLET(10 MG) BY MOUTH DAILY 30 tablet 11  . aspirin EC 325 MG tablet Take 1 tablet (325 mg total) by mouth daily. (Patient taking differently: Take 325 mg by mouth at bedtime. ) 30 tablet 2  . benazepril (LOTENSIN) 5 MG tablet TAKE 1 TABLET(5 MG) BY MOUTH DAILY (Patient taking differently: TAKE 1 TABLET(5 MG) BY MOUTH DAILY at bedtime) 30 tablet 11  . Calcium Carbonate-Vitamin D (CALCIUM 600+D) 600-400 MG-UNIT tablet Take 1 tablet by mouth 2 (two) times daily.     [name] Kitchen CRANBERRY CONCENTRATE PO Take 1 capsule by mouth daily.    . fluticasone (FLONASE) 50 MCG/ACT nasal spray Place 1 spray into both nostrils daily.    [name] Kitchen gabapentin (NEURONTIN) 100 MG capsule TAKE 1 CAPSULE(100 MG) BY MOUTH TWICE DAILY 60 capsule 11  . gabapentin (NEURONTIN) 300 MG capsule TAKE 2 CAPSULES(600 MG) BY MOUTH AT BEDTIME 60 capsule 11  . hydrOXYzine (ATARAX/VISTARIL)  25 MG tablet TAKE 1 TABLET(25 MG) BY MOUTH EVERY 8 HOURS AS NEEDED FOR ANXIETY 60 tablet 5  . loratadine (CLARITIN) 10 MG tablet Take 10 mg by mouth daily.    . magnesium oxide (MAG-OX) 400 MG tablet Take 400 mg by mouth 2 (two) times daily.    . meloxicam (MOBIC) 15 MG tablet TAKE 1 TABLET BY MOUTH EVERY DAY WITH A MEALS    . metroNIDAZOLE (METROGEL) 1 % gel Apply topically daily. (Patient taking differently: Apply 1 application topically daily as needed (rosacea). ) 45 g 0  . Misc Natural Products (OSTEO BI-FLEX ADV DOUBLE ST PO) Take 1 tablet by mouth 2 (two) times daily.     . montelukast (SINGULAIR) 10 MG tablet TAKE 1 TABLET BY MOUTH EVERY DAY 30 tablet 12  . Multiple Vitamin (MULTIVITAMIN WITH MINERALS) TABS tablet Take 1 tablet by mouth daily.    . Omega-3 Fatty Acids (FISH OIL) 1000 MG CAPS  Take 1,000 mg by mouth daily.     [name] Kitchen omeprazole (PRILOSEC) 20 MG capsule Take 20 mg by mouth daily before breakfast.     . OVER THE COUNTER MEDICATION Take 2 tablets by mouth daily. Estra-C    . OVER THE COUNTER MEDICATION Take 2 capsules by mouth 2 (two) times daily. Kyloic    . oxybutynin (DITROPAN-XL) 10 MG 24 hr tablet TAKE 1 TABLET BY MOUTH AT BEDTIME (Patient taking differently: TAKE 1 TABLET BY MOUTH DAILY) 30 tablet 12  . Probiotic Product (ALIGN) 4 MG CAPS Take 4 mg by mouth daily.     . ranitidine (ZANTAC) 150 MG tablet Take 150 mg by mouth at bedtime.    [name] Kitchen rOPINIRole (REQUIP) 0.25 MG tablet One at bedtime for two days, then 2 at bedtime for 5 days, then 3 at bedtime for 1 week, then 4 at bedtime 60 tablet 1  . sertraline (ZOLOFT) 50 MG tablet Take 1 tablet (50 mg total) by mouth at bedtime. 30 tablet 2  . theophylline (UNIPHYL) 400 MG 24 hr tablet TAKE 1 TABLET BY MOUTH DAILY. 30 tablet 12  . tiZANidine (ZANAFLEX) 2 MG tablet TAKE 1 TABLET(2 MG) BY MOUTH EVERY 8 HOURS AS NEEDED FOR MUSCLE SPASMS OR FOR BACK PAIN 30 tablet 0  . traMADol (ULTRAM) 50 MG tablet Take 2 tablets (100 mg total) by  mouth 2 (two) times daily. 60 tablet 3  . trifluoperazine (STELAZINE) 10 MG tablet Take 1 tablet (10 mg total) by mouth at bedtime. 30 tablet 2   No current facility-administered medications for this visit.      PHYSICAL EXAMINATION: ECOG PERFORMANCE STATUS: 1 - Symptomatic but completely ambulatory Vitals:   05/12/17 1404  BP: (!) 164/83  Pulse: 78  Resp: 16  Temp: (!) 97.5 F (36.4 C)   Filed Weights   05/12/17 1404  Weight: 232 lb 4.8 oz (105.4 kg)    Physical Exam  Constitutional: She is oriented to person, place, and time and well-developed, well-nourished, and in no distress. No distress.  HENT:  Head: Normocephalic and atraumatic.  Eyes: EOM are normal. Pupils are equal, round, and reactive to light.  Neck: Normal range of motion. Neck supple. No thyromegaly present.  Cardiovascular: Normal rate and regular rhythm.  Pulmonary/Chest: Effort normal and breath sounds normal. No respiratory distress.  Abdominal: Soft. Bowel sounds are normal.  Musculoskeletal: Normal range of motion.  Lymphadenopathy:    She has no cervical adenopathy.  Neurological: She is alert and oriented to person, place, and time.  Skin: Skin is warm and dry.  Psychiatric: Affect normal.     LABORATORY DATA:  I have reviewed the data as listed Lab Results  Component Value Date   WBC 5.0 05/12/2017   HGB 14.8 05/12/2017   HCT 45.0 05/12/2017   MCV 92.9 05/12/2017   PLT 201 05/12/2017   Recent Labs    08/20/16 1506  NA 137  K 5.0  CL 97  CO2 26  GLUCOSE 97  BUN 13  CREATININE 0.85  CALCIUM 9.5  GFRNONAA 66  GFRAA 76  ALBUMIN 3.8       ASSESSMENT & PLAN:  1. 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 Discussed with patient that will order basic work up including repeating immunoglobulin, SPEP, light chain, TSH, LDH, UA, flowcytometry and ANA.  Patient can follow up in 10 days to discuss results.   All questions were  answered. The patient knows to call the  clinic with any problems questions or concerns.  Thank you for this kind referral and the opportunity to participate in the care of this patient. A copy of today's note is routed to referring provider    Earlie Server, MD, PhD Hematology Oncology [facility] at [facility] Pager- [phone] 05/12/2017

## 2017-05-13 DIAGNOSIS — M48062 Spinal stenosis, lumbar region with neurogenic claudication: Secondary | ICD-10-CM | POA: Diagnosis not present

## 2017-05-13 DIAGNOSIS — I10 Essential (primary) hypertension: Secondary | ICD-10-CM | POA: Diagnosis not present

## 2017-05-13 DIAGNOSIS — G629 Polyneuropathy, unspecified: Secondary | ICD-10-CM | POA: Diagnosis not present

## 2017-05-13 DIAGNOSIS — F329 Major depressive disorder, single episode, unspecified: Secondary | ICD-10-CM | POA: Diagnosis not present

## 2017-05-13 DIAGNOSIS — G2581 Restless legs syndrome: Secondary | ICD-10-CM | POA: Diagnosis not present

## 2017-05-13 DIAGNOSIS — F209 Schizophrenia, unspecified: Secondary | ICD-10-CM | POA: Diagnosis not present

## 2017-05-13 DIAGNOSIS — M1990 Unspecified osteoarthritis, unspecified site: Secondary | ICD-10-CM | POA: Diagnosis not present

## 2017-05-13 DIAGNOSIS — J449 Chronic obstructive pulmonary disease, unspecified: Secondary | ICD-10-CM | POA: Diagnosis not present

## 2017-05-13 DIAGNOSIS — M5116 Intervertebral disc disorders with radiculopathy, lumbar region: Secondary | ICD-10-CM | POA: Diagnosis not present

## 2017-05-13 LAB — KAPPA/LAMBDA LIGHT CHAINS
KAPPA, LAMDA LIGHT CHAIN RATIO: 1.32 (ref 0.26–1.65)
Kappa free light chain: 10.2 mg/L (ref 3.3–19.4)
LAMDA FREE LIGHT CHAINS: 7.7 mg/L (ref 5.7–26.3)

## 2017-05-13 LAB — ANTINUCLEAR ANTIBODIES, IFA: ANTINUCLEAR ANTIBODIES, IFA: NEGATIVE

## 2017-05-13 LAB — PROTEIN ELECTROPHORESIS, SERUM
A/G RATIO SPE: 1.3 (ref 0.7–1.7)
Albumin ELP: 3.3 g/dL (ref 2.9–4.4)
Alpha-1-Globulin: 0.3 g/dL (ref 0.0–0.4)
Alpha-2-Globulin: 0.7 g/dL (ref 0.4–1.0)
Beta Globulin: 1 g/dL (ref 0.7–1.3)
GLOBULIN, TOTAL: 2.5 g/dL (ref 2.2–3.9)
Gamma Globulin: 0.5 g/dL (ref 0.4–1.8)
Total Protein ELP: 5.8 g/dL — ABNORMAL LOW (ref 6.0–8.5)

## 2017-05-14 LAB — IMMUNOGLOBULINS A/E/G/M, SERUM
IGG (IMMUNOGLOBIN G), SERUM: 548 mg/dL — AB (ref 700–1600)
IGM (IMMUNOGLOBULIN M), SRM: 17 mg/dL — AB (ref 26–217)
IgA: 87 mg/dL (ref 64–422)
IgE (Immunoglobulin E), Serum: 19 IU/mL (ref 0–100)

## 2017-05-17 LAB — COMP PANEL: LEUKEMIA/LYMPHOMA

## 2017-05-18 DIAGNOSIS — M1990 Unspecified osteoarthritis, unspecified site: Secondary | ICD-10-CM | POA: Diagnosis not present

## 2017-05-18 DIAGNOSIS — I10 Essential (primary) hypertension: Secondary | ICD-10-CM | POA: Diagnosis not present

## 2017-05-18 DIAGNOSIS — G2581 Restless legs syndrome: Secondary | ICD-10-CM | POA: Diagnosis not present

## 2017-05-18 DIAGNOSIS — G629 Polyneuropathy, unspecified: Secondary | ICD-10-CM | POA: Diagnosis not present

## 2017-05-18 DIAGNOSIS — F209 Schizophrenia, unspecified: Secondary | ICD-10-CM | POA: Diagnosis not present

## 2017-05-18 DIAGNOSIS — F329 Major depressive disorder, single episode, unspecified: Secondary | ICD-10-CM | POA: Diagnosis not present

## 2017-05-18 DIAGNOSIS — J449 Chronic obstructive pulmonary disease, unspecified: Secondary | ICD-10-CM | POA: Diagnosis not present

## 2017-05-18 DIAGNOSIS — M48062 Spinal stenosis, lumbar region with neurogenic claudication: Secondary | ICD-10-CM | POA: Diagnosis not present

## 2017-05-18 DIAGNOSIS — M5116 Intervertebral disc disorders with radiculopathy, lumbar region: Secondary | ICD-10-CM | POA: Diagnosis not present

## 2017-05-19 ENCOUNTER — Ambulatory Visit: Payer: Medicare HMO | Admitting: Psychiatry

## 2017-05-20 DIAGNOSIS — M5116 Intervertebral disc disorders with radiculopathy, lumbar region: Secondary | ICD-10-CM | POA: Diagnosis not present

## 2017-05-20 DIAGNOSIS — G629 Polyneuropathy, unspecified: Secondary | ICD-10-CM | POA: Diagnosis not present

## 2017-05-20 DIAGNOSIS — M48062 Spinal stenosis, lumbar region with neurogenic claudication: Secondary | ICD-10-CM | POA: Diagnosis not present

## 2017-05-20 DIAGNOSIS — F329 Major depressive disorder, single episode, unspecified: Secondary | ICD-10-CM | POA: Diagnosis not present

## 2017-05-20 DIAGNOSIS — I10 Essential (primary) hypertension: Secondary | ICD-10-CM | POA: Diagnosis not present

## 2017-05-20 DIAGNOSIS — F209 Schizophrenia, unspecified: Secondary | ICD-10-CM | POA: Diagnosis not present

## 2017-05-20 DIAGNOSIS — J449 Chronic obstructive pulmonary disease, unspecified: Secondary | ICD-10-CM | POA: Diagnosis not present

## 2017-05-20 DIAGNOSIS — G2581 Restless legs syndrome: Secondary | ICD-10-CM | POA: Diagnosis not present

## 2017-05-20 DIAGNOSIS — M1990 Unspecified osteoarthritis, unspecified site: Secondary | ICD-10-CM | POA: Diagnosis not present

## 2017-05-23 ENCOUNTER — Ambulatory Visit: Payer: Medicare HMO | Admitting: Oncology

## 2017-05-25 ENCOUNTER — Inpatient Hospital Stay: Payer: Medicare HMO | Admitting: Oncology

## 2017-06-09 ENCOUNTER — Ambulatory Visit (INDEPENDENT_AMBULATORY_CARE_PROVIDER_SITE_OTHER): Payer: Medicare HMO | Admitting: Podiatry

## 2017-06-09 ENCOUNTER — Encounter: Payer: Self-pay | Admitting: Podiatry

## 2017-06-09 DIAGNOSIS — B351 Tinea unguium: Secondary | ICD-10-CM | POA: Diagnosis not present

## 2017-06-09 DIAGNOSIS — L84 Corns and callosities: Secondary | ICD-10-CM

## 2017-06-09 DIAGNOSIS — M79676 Pain in unspecified toe(s): Secondary | ICD-10-CM | POA: Diagnosis not present

## 2017-06-09 NOTE — Progress Notes (Signed)
Complaint:  Visit Type: Patient returns to my office for continued preventative foot care services. Complaint: Patient states" my nails have grown long and thick and become painful to walk and wear shoes" Patient has been diagnosed with DM with neuropathy. The patient presents for preventative foot care services. No changes to ROS.  She says her calluses are better since she is using crisco oil.  Podiatric Exam: Vascular: dorsalis pedis and posterior tibial pulses are palpable bilateral. Capillary return is immediate. Temperature gradient is WNL. Skin turgor WNL  Sensorium: Normal Semmes Weinstein monofilament test. Normal tactile sensation bilaterally. Nail Exam: Pt has thick disfigured discolored nails with subungual debris noted bilateral entire nail hallux through fifth toenails Ulcer Exam: There is no evidence of ulcer or pre-ulcerative changes or infection. Orthopedic Exam: Muscle tone and strength are WNL. No limitations in general ROM. No crepitus or effusions noted. Foot type and digits show no abnormalities. Bony prominences are unremarkable. Skin: Asymptomatic   Porokeratosis  Hallux  B/L. No infection or ulcers  Diagnosis:  Onychomycosis, , Pain in right toe, pain in left toes  Treatment & Plan Procedures and Treatment: Consent by patient was obtained for treatment procedures.   Debridement of mycotic and hypertrophic toenails, 1 through 5 bilateral and clearing of subungual debris. No ulceration, no infection noted.  Return Visit-Office Procedure: Patient instructed to return to the office for a follow up visit 3 months for continued evaluation and treatment.   I recommended she allow me to perform preventative foot care services       Gardiner Barefoot DPM

## 2017-06-16 ENCOUNTER — Ambulatory Visit: Payer: Medicare HMO | Admitting: Oncology

## 2017-06-17 ENCOUNTER — Ambulatory Visit (INDEPENDENT_AMBULATORY_CARE_PROVIDER_SITE_OTHER): Payer: Medicare HMO | Admitting: Family Medicine

## 2017-06-17 ENCOUNTER — Encounter: Payer: Self-pay | Admitting: Family Medicine

## 2017-06-17 VITALS — BP 140/70 | HR 78 | Temp 98.0°F | Resp 18 | Wt 235.0 lb

## 2017-06-17 DIAGNOSIS — N390 Urinary tract infection, site not specified: Secondary | ICD-10-CM | POA: Diagnosis not present

## 2017-06-17 DIAGNOSIS — R350 Frequency of micturition: Secondary | ICD-10-CM

## 2017-06-17 DIAGNOSIS — M549 Dorsalgia, unspecified: Secondary | ICD-10-CM | POA: Diagnosis not present

## 2017-06-17 DIAGNOSIS — R5383 Other fatigue: Secondary | ICD-10-CM | POA: Diagnosis not present

## 2017-06-17 LAB — POCT URINALYSIS DIPSTICK
BILIRUBIN UA: NEGATIVE
Glucose, UA: NEGATIVE
Ketones, UA: NEGATIVE
LEUKOCYTES UA: NEGATIVE
NITRITE UA: NEGATIVE
Odor: NORMAL
PH UA: 6.5 (ref 5.0–8.0)
PROTEIN UA: NEGATIVE
Spec Grav, UA: 1.01 (ref 1.010–1.025)
UROBILINOGEN UA: 0.2 U/dL

## 2017-06-17 MED ORDER — TIZANIDINE HCL 4 MG PO TABS: 4.0000 mg | ORAL_TABLET | Freq: Three times a day (TID) | ORAL | 5 refills | Status: DC | PRN

## 2017-06-17 NOTE — Progress Notes (Signed)
Patient: Vicki Morales Female    DOB: 1940/09/20   77 y.o.   MRN: [id number] Visit Date: 06/17/2017  Today's Provider: Lelon Huh, MD   Chief Complaint  Patient presents with  . Fatigue  . Urinary Frequency   Subjective:    HPI Fatigue: Patient comes in today with several symptoms. She states for the past 2 months she has noticed a decrease in her appetite. For the past 2 weeks she has been weak and feeling tired. She also has frequency in urination and her urine has an odor. States she feels fatigued all the time and wondering if her medications are having an effect. She has CMP and CBC done at [facility] last month which were normal.   She reports she hasn't gotten amlodipine filled for several months. States physical therapist has been checking BP and reports BP was good.   Also complains of frequent spasm pains in her legs and back for which tizandine helps somewhat, but feels she needs a higher dose.    Allergies  Allergen Reactions  . Arthrotec [Diclofenac-Misoprostol] Hives  . Codeine Other (See Comments)    Headache   . Diclofenac Other (See Comments) and Hives    Pt unsure allergy  . Hydrochlorothiazide Other (See Comments)    Weakness   . Penicillins Hives    Has patient had a PCN reaction causing immediate rash, facial/tongue/throat swelling, SOB or lightheadedness with hypotension: No Has patient had a PCN reaction causing severe rash involving mucus membranes or skin necrosis: No Has patient had a PCN reaction that required hospitalization: No Has patient had a PCN reaction occurring within the last 10 years: No If all of the above answers are "NO", then may proceed with Cephalosporin use.  . Sulfa Antibiotics Hives  . Tiotropium Bromide Monohydrate Other (See Comments)    Blurry vision   . Tylenol [Acetaminophen] Other (See Comments)    Doesn't work  . Morphine Hives and Rash  . Pantoprazole Rash     Current Outpatient Medications:  .   ADVAIR DISKUS 250-50 MCG/DOSE AEPB, INHALE 1 PUFF INTO THE LUNGS EVERY 12 HOURS AS NEEDED (Patient taking differently: INHALE 1 PUFF INTO THE LUNGS EVERY 12 HOURS), Disp: 1 each, Rfl: 12 .  albuterol (PROVENTIL) (2.5 MG/3ML) 0.083% nebulizer solution, Take 3 mLs (2.5 mg total) by nebulization 2 (two) times daily as needed for wheezing or shortness of breath., Disp: 75 mL, Rfl: 5 .  aspirin EC 325 MG tablet, Take 1 tablet (325 mg total) by mouth daily. (Patient taking differently: Take 325 mg by mouth at bedtime. ), Disp: 30 tablet, Rfl: 2 .  benazepril (LOTENSIN) 5 MG tablet, TAKE 1 TABLET(5 MG) BY MOUTH DAILY (Patient taking differently: TAKE 1 TABLET(5 MG) BY MOUTH DAILY at bedtime), Disp: 30 tablet, Rfl: 11 .  Calcium Carbonate-Vitamin D (CALCIUM 600+D) 600-400 MG-UNIT tablet, Take 1 tablet by mouth 2 (two) times daily. , Disp: , Rfl:  .  CRANBERRY CONCENTRATE PO, Take 1 capsule by mouth daily., Disp: , Rfl:  .  fluticasone (FLONASE) 50 MCG/ACT nasal spray, Place 1 spray into both nostrils daily., Disp: , Rfl:  .  gabapentin (NEURONTIN) 100 MG capsule, TAKE 1 CAPSULE(100 MG) BY MOUTH TWICE DAILY, Disp: 60 capsule, Rfl: 11 .  gabapentin (NEURONTIN) 300 MG capsule, TAKE 2 CAPSULES(600 MG) BY MOUTH AT BEDTIME, Disp: 60 capsule, Rfl: 11 .  hydrOXYzine (ATARAX/VISTARIL) 25 MG tablet, TAKE 1 TABLET(25 MG) BY MOUTH EVERY 8  HOURS AS NEEDED FOR ANXIETY, Disp: 60 tablet, Rfl: 5 .  loratadine (CLARITIN) 10 MG tablet, Take 10 mg by mouth daily., Disp: , Rfl:  .  magnesium oxide (MAG-OX) 400 MG tablet, Take 400 mg by mouth 2 (two) times daily., Disp: , Rfl:  .  meloxicam (MOBIC) 15 MG tablet, TAKE 1 TABLET BY MOUTH EVERY DAY WITH A MEALS, Disp: , Rfl:  .  Misc Natural Products (OSTEO BI-FLEX ADV DOUBLE ST PO), Take 1 tablet by mouth 2 (two) times daily. , Disp: , Rfl:  .  montelukast (SINGULAIR) 10 MG tablet, TAKE 1 TABLET BY MOUTH EVERY DAY, Disp: 30 tablet, Rfl: 12 .  Multiple Vitamin (MULTIVITAMIN WITH  MINERALS) TABS tablet, Take 1 tablet by mouth daily., Disp: , Rfl:  .  Omega-3 Fatty Acids (FISH OIL) 1000 MG CAPS, Take 1,000 mg by mouth daily. , Disp: , Rfl:  .  omeprazole (PRILOSEC) 20 MG capsule, Take 20 mg by mouth daily before breakfast. , Disp: , Rfl:  .  OVER THE COUNTER MEDICATION, Take 2 tablets by mouth daily. Estra-C, Disp: , Rfl:  .  OVER THE COUNTER MEDICATION, Take 2 capsules by mouth 2 (two) times daily. Kyloic, Disp: , Rfl:  .  oxybutynin (DITROPAN-XL) 10 MG 24 hr tablet, TAKE 1 TABLET BY MOUTH AT BEDTIME (Patient taking differently: TAKE 1 TABLET BY MOUTH DAILY), Disp: 30 tablet, Rfl: 12 .  Probiotic Product (ALIGN) 4 MG CAPS, Take 4 mg by mouth daily. , Disp: , Rfl:  .  ranitidine (ZANTAC) 150 MG tablet, Take 150 mg by mouth at bedtime., Disp: , Rfl:  .  sertraline (ZOLOFT) 50 MG tablet, Take 1 tablet (50 mg total) by mouth at bedtime., Disp: 30 tablet, Rfl: 2 .  theophylline (UNIPHYL) 400 MG 24 hr tablet, TAKE 1 TABLET BY MOUTH DAILY., Disp: 30 tablet, Rfl: 12 .  tiZANidine (ZANAFLEX) 2 MG tablet, TAKE 1 TABLET(2 MG) BY MOUTH EVERY 8 HOURS AS NEEDED FOR MUSCLE SPASMS OR FOR BACK PAIN, Disp: 30 tablet, Rfl: 0 .  traMADol (ULTRAM) 50 MG tablet, Take 2 tablets (100 mg total) by mouth 2 (two) times daily., Disp: 60 tablet, Rfl: 3 .  trifluoperazine (STELAZINE) 10 MG tablet, Take 1 tablet (10 mg total) by mouth at bedtime., Disp: 30 tablet, Rfl: 2  Review of Systems  Constitutional: Positive for fatigue. Negative for appetite change, chills and fever.  Respiratory: Negative for chest tightness and shortness of breath.   Cardiovascular: Negative for chest pain and palpitations.  Gastrointestinal: Negative for abdominal pain, nausea and vomiting.  Genitourinary: Positive for frequency.  Neurological: Positive for weakness. Negative for dizziness.    Social History   Tobacco Use  . Smoking status: Former Smoker    Years: 30.00  . Smokeless tobacco: Never Used  . Tobacco  comment: quit in 1989  Substance Use Topics  . Alcohol use: No    Alcohol/week: 0.0 oz   Objective:   BP 140/70 (BP Location: Left Arm, Patient Position: Sitting, Cuff Size: Large)   Pulse 78   Temp 98 F (36.7 C) (Oral)   Resp 18   Wt 235 lb (106.6 kg)   SpO2 95% Comment: room air  BMI 41.63 kg/m  There were no vitals filed for this visit.   Physical Exam   General Appearance:    Alert, cooperative, no distress, obese  Eyes:    PERRL, conjunctiva/corneas clear, EOM's intact       Lungs:     Clear  to auscultation bilaterally, respirations unlabored  Heart:    Regular rate and rhythm  Neurologic:   Awake, alert, oriented x 3. No apparent focal neurological           defect.       Results for orders placed or performed in visit on 06/17/17  POCT Urinalysis Dipstick  Result Value Ref Range   Color, UA yellow    Clarity, UA clear    Glucose, UA negative    Bilirubin, UA negative    Ketones, UA negative    Spec Grav, UA 1.010 1.010 - 1.025   Blood, UA Trace (Non-hemolyzed)    pH, UA 6.5 5.0 - 8.0   Protein, UA negative    Urobilinogen, UA 0.2 0.2 or 1.0 E.U./dL   Nitrite, UA negative    Leukocytes, UA Negative Negative   Appearance clear    Odor normal         Assessment & Plan:     1. Urinary frequency  - POCT Urinalysis Dipstick  2. Urinary tract infection without hematuria, site unspecified  - Urine Culture  3. Back pain, unspecified back location, unspecified back pain laterality, unspecified chronicity Try increasing dose of tizandine, but advised that may make her more fatigued.  - tiZANidine (ZANAFLEX) 4 MG tablet; Take 1 tablet (4 mg total) by mouth every 8 (eight) hours as needed for muscle spasms.  Dispense: 30 tablet; Refill: 5  4. Other fatigue Multifactorial. Recommend she discuss medications with her psychiatrist, encourage regular exercise.        Lelon Huh, MD  [facility]

## 2017-06-19 LAB — URINE CULTURE

## 2017-06-21 ENCOUNTER — Ambulatory Visit: Payer: Medicare HMO | Admitting: Psychiatry

## 2017-06-29 ENCOUNTER — Other Ambulatory Visit: Payer: Self-pay | Admitting: Psychiatry

## 2017-07-01 ENCOUNTER — Telehealth: Payer: Self-pay | Admitting: Oncology

## 2017-07-01 NOTE — Telephone Encounter (Signed)
Left VM for pt to call office to r\s appt due to Dr. Tasia Catchings being out of office.

## 2017-07-06 ENCOUNTER — Ambulatory Visit (INDEPENDENT_AMBULATORY_CARE_PROVIDER_SITE_OTHER): Payer: Medicare HMO | Admitting: Psychiatry

## 2017-07-06 ENCOUNTER — Other Ambulatory Visit: Payer: Self-pay

## 2017-07-06 ENCOUNTER — Encounter: Payer: Self-pay | Admitting: Psychiatry

## 2017-07-06 VITALS — BP 144/80 | HR 84 | Temp 97.5°F | Wt 233.4 lb

## 2017-07-06 DIAGNOSIS — F209 Schizophrenia, unspecified: Secondary | ICD-10-CM | POA: Diagnosis not present

## 2017-07-06 DIAGNOSIS — F3342 Major depressive disorder, recurrent, in full remission: Secondary | ICD-10-CM | POA: Diagnosis not present

## 2017-07-06 MED ORDER — SERTRALINE HCL 50 MG PO TABS: 50.0000 mg | ORAL_TABLET | Freq: Every day | ORAL | 2 refills | Status: DC

## 2017-07-06 MED ORDER — QUETIAPINE FUMARATE 50 MG PO TABS: 50.0000 mg | ORAL_TABLET | Freq: Every day | ORAL | 2 refills | Status: DC

## 2017-07-06 NOTE — Progress Notes (Signed)
Patient ID: Vicki Morales, female   DOB: 08-29-1940, 77 y.o.   MRN: [id number]   [place] MD/PA/NP OP Progress Note  07/06/2017 2:16 PM [facility]  MRN:  [id number]  Subjective: . Patient was seen today with her daughter. Patient reports that she has not been doing well. Reports that they ran out of the Stelazine since it is not being made anymore. She has not been taking it for 1 week.Patient reports increased anxiety and some panic symptoms. Also reports she has not been sleeping well. Patient reports she had to take the hydroxyzine at 75 mg for panic symptoms the other day. She also reports to this clinician that she feels the Zoloft is not doing anything for her. We discussed that for now we will continue her on the current dose of the Zoloft and add Seroquel to help her sleep and as a substitute for Stelazine for now. We discussed transitioning her to Dr. Shea Evans as we have discussed before and patient is okay with it now.  Chief Complaint: increased anxiety Chief Complaint    Follow-up; Medication Problem; Anxiety; Panic Attack; Insomnia     Visit Diagnosis:     ICD-10-CM   1. Schizophrenia, unspecified type ([place]) F20.9   2. 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 Anxiety   . Arthritis   . Cataract    right eye  . COPD (chronic obstructive pulmonary disease) ([place])   . Depression   . Dyspnea    DOE  . Dysrhythmia   . Edema    FEET/ANKLES  . GERD (gastroesophageal reflux disease)   . H/O wheezing   . History of hiatal hernia   . HOH (hard of hearing)   . Hypertension   . Incontinence of urine in female   . Orthopnea   . Pain    CHRONIC KNEE  . Pain    CHRONIC KNEE  . Paranoid disorder ([place])   . RLS (restless legs syndrome)   . Tremors of nervous system    HANDS    Past Surgical History:  Procedure Laterality Date  . ABDOMINAL HYSTERECTOMY  1988  . APPENDECTOMY   1988  . CARDIAC CATHETERIZATION    . CATARACT EXTRACTION W/PHACO Right 07/14/2016   Procedure: CATARACT EXTRACTION PHACO AND INTRAOCULAR LENS PLACEMENT ([place]) anterior vitrectomy;  Surgeon: Estill Cotta, MD;  Location: [facility] ORS;  Service: Ophthalmology;  Laterality: Right;  [place] 02:40 AP% 24.4 CDE 59.98 Fluid pack # [id number]  . CATARACT EXTRACTION W/PHACO Left 02/16/2017   Procedure: CATARACT EXTRACTION PHACO AND INTRAOCULAR LENS PLACEMENT (IOC);  Surgeon: Estill Cotta, MD;  Location: [facility] ORS;  Service: Ophthalmology;  Laterality: Left;  Lot # [id number] H [place]: 01:11.8 AP%: 23.5 CDE: 32.09   . ESOPHAGOGASTRODUODENOSCOPY N/A 11/11/2014   Procedure: ESOPHAGOGASTRODUODENOSCOPY (EGD);  Surgeon: Josefine Class, MD;  [facility] END[facility]COPY;  Service: Endoscopy;  Laterality: N/A;  . TONSILLECTOMY AND ADENOIDECTOMY    . TRIGGER FINGER RELEASE  2004   Family History:  Family History  Problem Relation Age of Onset  . Breast cancer Sister   . Stroke Father   . Emphysema Father   . Anxiety disorder Father   . Depression Father   . Anxiety disorder Brother    Social History:  Social History   Socioeconomic History  . Marital status: Widowed    Spouse name: None  . Number of children: None  .  Years of education: None  . Highest education level: None  Social Needs  . Financial resource strain: None  . Food insecurity - worry: None  . Food insecurity - inability: None  . Transportation needs - medical: None  . Transportation needs - non-medical: None  Occupational History  . None  Tobacco Use  . Smoking status: Former Smoker    Years: 30.00  . Smokeless tobacco: Never Used  . Tobacco comment: quit in 1989  Substance and Sexual Activity  . Alcohol use: No    Alcohol/week: 0.0 oz  . Drug use: No  . Sexual activity: Not Currently  Other Topics Concern  . None  Social History Narrative  . None   Additional History:   Assessment:   Musculoskeletal: Strength & Muscle  Tone: within normal limits Gait & Station: Slow and walks with a cane Patient leans: N/A  Psychiatric Specialty Exam: Medication Refill     Review of Systems  Psychiatric/Behavioral: Negative for depression, hallucinations, memory loss, substance abuse and suicidal ideas. The patient is not nervous/anxious and does not have insomnia.   All other systems reviewed and are negative.   Blood pressure (!) 144/80, pulse 84, temperature (!) 97.5 F (36.4 C), temperature source Oral, weight 105.9 kg (233 lb 6.4 oz).Body mass index is 41.34 kg/m.  General Appearance: Well Groomed  Eye Contact:  Good  Speech:  Normal Rate  Volume:  Normal  Mood:  anxious  Affect:  Congruent Bright, smiling   Thought Process:  Linear and Logical  Orientation:  Full (Time, Place, and Person)  Thought Content:  Negative  Suicidal Thoughts:  No  Homicidal Thoughts:  No  Memory:  Immediate;   Good Recent;   Good Remote;   Good  Judgement:  Good  Insight:  Good  Psychomotor Activity:  Normal  Concentration:  Good  Recall:  Good  Fund of Knowledge: Good  Language: Good  Akathisia:  Negative  Handed:    AIMS (if indicated):  Mild cogwheeling noted on right arm   Assets:  Communication Skills Desire for Improvement  ADL's:  Intact  Cognition: WNL  Sleep:  Not good   Is the patient at risk to self?  No. Has the patient been a risk to self in the past 6 months?  No. Has the patient been a risk to self within the distant past?  Yes.  OD in 1979 Is the patient a risk to others?  No. Has the patient been a risk to others in the past 6 months?  No. Has the patient been a risk to others within the distant past?  No.  Current Medications: Current Outpatient Medications  Medication Sig Dispense Refill  . ADVAIR DISKUS 250-50 MCG/DOSE AEPB INHALE 1 PUFF INTO THE LUNGS EVERY 12 HOURS AS NEEDED (Patient taking differently: INHALE 1 PUFF INTO THE LUNGS EVERY 12 HOURS) 1 each 12  . albuterol (PROVENTIL) (2.5  MG/3ML) 0.083% nebulizer solution Take 3 mLs (2.5 mg total) by nebulization 2 (two) times daily as needed for wheezing or shortness of breath. 75 mL 5  . aspirin EC 325 MG tablet Take 1 tablet (325 mg total) by mouth daily. (Patient taking differently: Take 325 mg by mouth at bedtime. ) 30 tablet 2  . benazepril (LOTENSIN) 5 MG tablet TAKE 1 TABLET(5 MG) BY MOUTH DAILY (Patient taking differently: TAKE 1 TABLET(5 MG) BY MOUTH DAILY at bedtime) 30 tablet 11  . Calcium Carbonate-Vitamin D (CALCIUM 600+D) 600-400 MG-UNIT tablet Take 1 tablet  by mouth 2 (two) times daily.     [name] Kitchen CRANBERRY CONCENTRATE PO Take 1 capsule by mouth daily.    . fluticasone (FLONASE) 50 MCG/ACT nasal spray Place 1 spray into both nostrils daily.    [name] Kitchen gabapentin (NEURONTIN) 100 MG capsule TAKE 1 CAPSULE(100 MG) BY MOUTH TWICE DAILY 60 capsule 11  . gabapentin (NEURONTIN) 300 MG capsule TAKE 2 CAPSULES(600 MG) BY MOUTH AT BEDTIME 60 capsule 11  . hydrOXYzine (ATARAX/VISTARIL) 25 MG tablet TAKE 1 TABLET(25 MG) BY MOUTH EVERY 8 HOURS AS NEEDED FOR ANXIETY 60 tablet 5  . loratadine (CLARITIN) 10 MG tablet Take 10 mg by mouth daily.    . magnesium oxide (MAG-OX) 400 MG tablet Take 400 mg by mouth 2 (two) times daily.    . meloxicam (MOBIC) 15 MG tablet TAKE 1 TABLET BY MOUTH EVERY DAY WITH A MEALS    . Misc Natural Products (OSTEO BI-FLEX ADV DOUBLE ST PO) Take 1 tablet by mouth 2 (two) times daily.     . montelukast (SINGULAIR) 10 MG tablet TAKE 1 TABLET BY MOUTH EVERY DAY 30 tablet 12  . Multiple Vitamin (MULTIVITAMIN WITH MINERALS) TABS tablet Take 1 tablet by mouth daily.    . Omega-3 Fatty Acids (FISH OIL) 1000 MG CAPS Take 1,000 mg by mouth daily.     [name] Kitchen omeprazole (PRILOSEC) 20 MG capsule Take 20 mg by mouth daily before breakfast.     . OVER THE COUNTER MEDICATION Take 2 tablets by mouth daily. Estra-C    . OVER THE COUNTER MEDICATION Take 2 capsules by mouth 2 (two) times daily. Kyloic    . oxybutynin (DITROPAN-XL) 10 MG 24  hr tablet TAKE 1 TABLET BY MOUTH AT BEDTIME (Patient taking differently: TAKE 1 TABLET BY MOUTH DAILY) 30 tablet 12  . Probiotic Product (ALIGN) 4 MG CAPS Take 4 mg by mouth daily.     . ranitidine (ZANTAC) 150 MG tablet Take 150 mg by mouth at bedtime.    . sertraline (ZOLOFT) 50 MG tablet Take 1 tablet (50 mg total) by mouth at bedtime. 30 tablet 2  . theophylline (UNIPHYL) 400 MG 24 hr tablet TAKE 1 TABLET BY MOUTH DAILY. 30 tablet 12  . tiZANidine (ZANAFLEX) 4 MG tablet Take 1 tablet (4 mg total) by mouth every 8 (eight) hours as needed for muscle spasms. 30 tablet 5  . traMADol (ULTRAM) 50 MG tablet Take 2 tablets (100 mg total) by mouth 2 (two) times daily. 60 tablet 3  . trifluoperazine (STELAZINE) 10 MG tablet Take 1 tablet (10 mg total) by mouth at bedtime. 30 tablet 2   No current facility-administered medications for this visit.     Medical Decision Making:  Established Problem, Stable/Improving (1) and Review of Medication Regimen & Side Effects (2)  Treatment Plan Summary:Medication management and Plan Plan   MDD in full remission Continue sertraline at 50 mg daily  Continue gabapentin per Dr.Fisher   Schizophrenia  Discontinue Stelazine since it is not available. Start Seroquel at 50 mg at bedtime. Patient and her daughter made aware of side effects of sedation, increased appetite and long-term side effects with risk of diabetes and movement disorders.  She will follow-up in 3 weeks with Dr.Eappen for transition of care or call before if needed.  Charish Schroepfer 07/06/2017, 2:16 PM

## 2017-07-07 ENCOUNTER — Telehealth: Payer: Self-pay | Admitting: Psychiatry

## 2017-07-07 NOTE — Telephone Encounter (Signed)
Called and left a message on pt home phone that per dr. Einar Grad it was ok for her to take a 1/2 tablet but is she is still having issues with taking a 1/2 tablet she will need to call and make an appt with dr. Shea Evans sooner.

## 2017-07-07 NOTE — Telephone Encounter (Signed)
Pt was seen yesterday . Pt wanted to know if she can take 1/2 tablet she feel "loopy" and wants to know if she can try taking 1/2 tablet to see if that helps.     QUEtiapine (SEROQUEL) 50 MG tablet  Medication  Date: 07/06/2017 Department: [facility] Ordering/Authorizing: Elvin So, MD  Order Providers   Prescribing Provider Encounter Provider  Elvin So, MD Elvin So, MD  Medication Detail    Disp Refills Start End   QUEtiapine (SEROQUEL) 50 MG tablet 30 tablet 2 07/06/2017 07/06/2018   Sig - Route: Take 1 tablet (50 mg total) by mouth at bedtime. - Oral   Sent to pharmacy as: QUEtiapine (SEROQUEL) 50 MG tablet   E-Prescribing Status: Receipt confirmed by pharmacy (07/06/2017 2:18 PM EST)

## 2017-07-07 NOTE — Telephone Encounter (Signed)
She can follow up with Dr.Eappen

## 2017-07-07 NOTE — Telephone Encounter (Signed)
She can take half the dose and see how she does.

## 2017-07-07 NOTE — Telephone Encounter (Signed)
I understand that she is still my patient. In my previous, Message I did say that taking half a tablet is okay. My understanding is that taking half the tablet made her loopy. She can follow up with Dr.Eappen since she has to be started on a  new medication. She can see me if she wants to.

## 2017-07-07 NOTE — Telephone Encounter (Signed)
She does not have an appt until 07-27-17 with dr. Shea Evans.  She was seen yesterday by you and she is still your patient until she see dr. Shea Evans.  Please advise. If it ok for her to take 1/2 tablet

## 2017-07-12 ENCOUNTER — Encounter: Payer: Self-pay | Admitting: *Deleted

## 2017-07-12 DIAGNOSIS — R29898 Other symptoms and signs involving the musculoskeletal system: Secondary | ICD-10-CM | POA: Diagnosis not present

## 2017-07-12 DIAGNOSIS — M79604 Pain in right leg: Secondary | ICD-10-CM | POA: Diagnosis not present

## 2017-07-12 DIAGNOSIS — M4807 Spinal stenosis, lumbosacral region: Secondary | ICD-10-CM | POA: Diagnosis not present

## 2017-07-12 DIAGNOSIS — M79605 Pain in left leg: Secondary | ICD-10-CM | POA: Diagnosis not present

## 2017-07-12 DIAGNOSIS — G8929 Other chronic pain: Secondary | ICD-10-CM | POA: Diagnosis not present

## 2017-07-13 NOTE — Telephone Encounter (Signed)
ok 

## 2017-07-17 NOTE — Progress Notes (Deleted)
Hematology/Oncology Consult note [facility] Telephone:([phone] [phone]   Patient Care Team: Birdie Sons, MD as PCP - General (Family Medicine) Dingeldein, Remo Lipps, MD as Consulting Physician (Ophthalmology) Erby Pian, MD as Referring Physician (Specialist) Ubaldo Glassing Javier Docker, MD as Consulting Physician (Cardiology) Elvin So, MD as Consulting Physician (Psychiatry)  REFERRING PROVIDER: Neurology Dr.Shah CHIEF COMPLAINTS/PURPOSE OF CONSULTATION:  hypoglobulinemia  HISTORY OF PRESENTING ILLNESS:  Vicki Morales is a  77 y.o.  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  Patient reports feeling at her baseline. She has weight loss for the past year as she does not eat as much as before. She has chronic SOB with exertion due to emphysema.  She is overdue for age appropriate cancer screen including colonoscopy and mammogram. She says she has so many clinic visits and just want to have a break.     Review of Systems  Constitutional: Negative for fever.  HENT: Negative for hearing loss.   Eyes: Negative for blurred vision.  Respiratory: Negative for cough.   Cardiovascular: Negative for chest pain.  Gastrointestinal: Negative for heartburn.  Genitourinary: Negative for dysuria.  Musculoskeletal: Negative for myalgias.  Skin: Negative for rash.  Neurological: Negative for dizziness.  Endo/Heme/Allergies: Does not bruise/bleed easily.  Psychiatric/Behavioral: Negative for depression.    MEDICAL HISTORY:  Past Medical History:  Diagnosis Date  . Anxiety   . Arthritis   . Cataract    right eye  . COPD (chronic obstructive pulmonary disease) ([place])   . Depression   . Dyspnea    DOE  . Dysrhythmia   . Edema    FEET/ANKLES  . GERD (gastroesophageal reflux disease)     . H/O wheezing   . History of hiatal hernia   . HOH (hard of hearing)   . Hypertension   . Incontinence of urine in female   . Orthopnea   . Pain    CHRONIC KNEE  . Pain    CHRONIC KNEE  . Paranoid disorder ([place])   . RLS (restless legs syndrome)   . Tremors of nervous system    HANDS    SURGICAL HISTORY: Past Surgical History:  Procedure Laterality Date  . ABDOMINAL HYSTERECTOMY  1988  . APPENDECTOMY  1988  . CARDIAC CATHETERIZATION    . CATARACT EXTRACTION W/PHACO Right 07/14/2016   Procedure: CATARACT EXTRACTION PHACO AND INTRAOCULAR LENS PLACEMENT ([place]) anterior vitrectomy;  Surgeon: Estill Cotta, MD;  Location: [facility] ORS;  Service: Ophthalmology;  Laterality: Right;  [place] 02:40 AP% 24.4 CDE 59.98 Fluid pack # [id number]  . CATARACT EXTRACTION W/PHACO Left 02/16/2017   Procedure: CATARACT EXTRACTION PHACO AND INTRAOCULAR LENS PLACEMENT (IOC);  Surgeon: Estill Cotta, MD;  Location: [facility] ORS;  Service: Ophthalmology;  Laterality: Left;  Lot # [id number] H [place]: 01:11.8 AP%: 23.5 CDE: 32.09   . ESOPHAGOGASTRODUODENOSCOPY N/A 11/11/2014   Procedure: ESOPHAGOGASTRODUODENOSCOPY (EGD);  Surgeon: Josefine Class, MD;  Location: [facility] ENDOSCOPY;  Service: Endoscopy;  Laterality: N/A;  . TONSILLECTOMY AND ADENOIDECTOMY    . TRIGGER FINGER RELEASE  2004    SOCIAL HISTORY: Social History   Socioeconomic History  . Marital status: Widowed    Spouse name: Not on file  . Number of children: Not on file  . Years of education: Not on file  . Highest education level: Not on  file  Social Needs  . Financial resource strain: Not on file  . Food insecurity - worry: Not on file  . Food insecurity - inability: Not on file  . Transportation needs - medical: Not on file  . Transportation needs - non-medical: Not on file  Occupational History  . Not on file  Tobacco Use  . Smoking status: Former Smoker    Years: 30.00  . Smokeless tobacco: Never Used  . Tobacco comment: quit in  1989  Substance and Sexual Activity  . Alcohol use: No    Alcohol/week: 0.0 oz  . Drug use: No  . Sexual activity: Not Currently  Other Topics Concern  . Not on file  Social History Narrative  . Not on file    FAMILY HISTORY: Family History  Problem Relation Age of Onset  . Breast cancer Sister   . Stroke Father   . Emphysema Father   . Anxiety disorder Father   . Depression Father   . Anxiety disorder Brother     ALLERGIES:  is allergic to arthrotec [diclofenac-misoprostol]; codeine; diclofenac; hydrochlorothiazide; penicillins; sulfa antibiotics; tiotropium bromide monohydrate; tylenol [acetaminophen]; morphine; and pantoprazole.  MEDICATIONS:  Current Outpatient Medications  Medication Sig Dispense Refill  . ADVAIR DISKUS 250-50 MCG/DOSE AEPB INHALE 1 PUFF INTO THE LUNGS EVERY 12 HOURS AS NEEDED (Patient taking differently: INHALE 1 PUFF INTO THE LUNGS EVERY 12 HOURS) 1 each 12  . albuterol (PROVENTIL) (2.5 MG/3ML) 0.083% nebulizer solution Take 3 mLs (2.5 mg total) by nebulization 2 (two) times daily as needed for wheezing or shortness of breath. 75 mL 5  . aspirin EC 325 MG tablet Take 1 tablet (325 mg total) by mouth daily. (Patient taking differently: Take 325 mg by mouth at bedtime. ) 30 tablet 2  . benazepril (LOTENSIN) 5 MG tablet TAKE 1 TABLET(5 MG) BY MOUTH DAILY (Patient taking differently: TAKE 1 TABLET(5 MG) BY MOUTH DAILY at bedtime) 30 tablet 11  . Calcium Carbonate-Vitamin D (CALCIUM 600+D) 600-400 MG-UNIT tablet Take 1 tablet by mouth 2 (two) times daily.     [name] Kitchen CRANBERRY CONCENTRATE PO Take 1 capsule by mouth daily.    . fluticasone (FLONASE) 50 MCG/ACT nasal spray Place 1 spray into both nostrils daily.    [name] Kitchen gabapentin (NEURONTIN) 100 MG capsule TAKE 1 CAPSULE(100 MG) BY MOUTH TWICE DAILY 60 capsule 11  . gabapentin (NEURONTIN) 300 MG capsule TAKE 2 CAPSULES(600 MG) BY MOUTH AT BEDTIME 60 capsule 11  . hydrOXYzine (ATARAX/VISTARIL) 25 MG tablet TAKE 1  TABLET(25 MG) BY MOUTH EVERY 8 HOURS AS NEEDED FOR ANXIETY 60 tablet 5  . loratadine (CLARITIN) 10 MG tablet Take 10 mg by mouth daily.    . magnesium oxide (MAG-OX) 400 MG tablet Take 400 mg by mouth 2 (two) times daily.    . meloxicam (MOBIC) 15 MG tablet TAKE 1 TABLET BY MOUTH EVERY DAY WITH A MEALS    . Misc Natural Products (OSTEO BI-FLEX ADV DOUBLE ST PO) Take 1 tablet by mouth 2 (two) times daily.     . montelukast (SINGULAIR) 10 MG tablet TAKE 1 TABLET BY MOUTH EVERY DAY 30 tablet 12  . Multiple Vitamin (MULTIVITAMIN WITH MINERALS) TABS tablet Take 1 tablet by mouth daily.    . Omega-3 Fatty Acids (FISH OIL) 1000 MG CAPS Take 1,000 mg by mouth daily.     [name] Kitchen omeprazole (PRILOSEC) 20 MG capsule Take 20 mg by mouth daily before breakfast.     . OVER THE COUNTER MEDICATION Take  2 tablets by mouth daily. Estra-C    . OVER THE COUNTER MEDICATION Take 2 capsules by mouth 2 (two) times daily. Kyloic    . oxybutynin (DITROPAN-XL) 10 MG 24 hr tablet TAKE 1 TABLET BY MOUTH AT BEDTIME (Patient taking differently: TAKE 1 TABLET BY MOUTH DAILY) 30 tablet 12  . Probiotic Product (ALIGN) 4 MG CAPS Take 4 mg by mouth daily.     . QUEtiapine (SEROQUEL) 50 MG tablet Take 1 tablet (50 mg total) by mouth at bedtime. 30 tablet 2  . ranitidine (ZANTAC) 150 MG tablet Take 150 mg by mouth at bedtime.    . sertraline (ZOLOFT) 50 MG tablet Take 1 tablet (50 mg total) by mouth at bedtime. 30 tablet 2  . theophylline (UNIPHYL) 400 MG 24 hr tablet TAKE 1 TABLET BY MOUTH DAILY. 30 tablet 12  . tiZANidine (ZANAFLEX) 4 MG tablet Take 1 tablet (4 mg total) by mouth every 8 (eight) hours as needed for muscle spasms. 30 tablet 5  . traMADol (ULTRAM) 50 MG tablet Take 2 tablets (100 mg total) by mouth 2 (two) times daily. 60 tablet 3   No current facility-administered medications for this visit.      PHYSICAL EXAMINATION: ECOG PERFORMANCE STATUS: 1 - Symptomatic but completely ambulatory There were no vitals filed for  this visit. There were no vitals filed for this visit.  Physical Exam  Constitutional: She is oriented to person, place, and time and well-developed, well-nourished, and in no distress. No distress.  HENT:  Head: Normocephalic and atraumatic.  Eyes: EOM are normal. Pupils are equal, round, and reactive to light.  Neck: Normal range of motion. Neck supple. No thyromegaly present.  Cardiovascular: Normal rate and regular rhythm.  Pulmonary/Chest: Effort normal and breath sounds normal. No respiratory distress.  Abdominal: Soft. Bowel sounds are normal.  Musculoskeletal: Normal range of motion.  Lymphadenopathy:    She has no cervical adenopathy.  Neurological: She is alert and oriented to person, place, and time.  Skin: Skin is warm and dry.  Psychiatric: Affect normal.     LABORATORY DATA:  I have reviewed the data as listed Lab Results  Component Value Date   WBC 5.0 05/12/2017   HGB 14.8 05/12/2017   HCT 45.0 05/12/2017   MCV 92.9 05/12/2017   PLT 201 05/12/2017   Recent Labs    08/20/16 1506 05/12/17 1510  NA 137 135  K 5.0 4.3  CL 97 101  CO2 26 26  GLUCOSE 97 108*  BUN 13 15  CREATININE 0.85 0.67  CALCIUM 9.5 9.8  GFRNONAA 66 >60  GFRAA 76 >60  PROT  --  6.5  ALBUMIN 3.8 3.8  AST  --  20  ALT  --  14  ALKPHOS  --  92  BILITOT  --  0.7       ASSESSMENT & PLAN:  No diagnosis found. 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 Discussed with patient that will order basic work up including repeating immunoglobulin, SPEP, light chain, TSH, LDH, UA, flowcytometry and ANA.  Patient can follow up in 10 days to discuss results.   All questions were answered. The patient knows to call the clinic with any problems questions or concerns.  Thank you for this kind referral and the opportunity to participate in the care of this patient. A copy of today's note is routed to referring provider    Earlie Server, MD, PhD Hematology  Oncology [facility] [place]-  [phone] 07/17/2017  

## 2017-07-18 ENCOUNTER — Inpatient Hospital Stay: Payer: Medicare HMO | Admitting: Oncology

## 2017-07-18 ENCOUNTER — Telehealth: Payer: Self-pay | Admitting: Oncology

## 2017-07-18 NOTE — Telephone Encounter (Signed)
rschd noshow MD appt. L/M on v/m. Also mailed appt.

## 2017-07-21 ENCOUNTER — Ambulatory Visit: Payer: Medicare HMO | Admitting: Oncology

## 2017-07-27 ENCOUNTER — Ambulatory Visit (INDEPENDENT_AMBULATORY_CARE_PROVIDER_SITE_OTHER): Payer: Medicare HMO | Admitting: Psychiatry

## 2017-07-27 ENCOUNTER — Encounter: Payer: Self-pay | Admitting: Psychiatry

## 2017-07-27 VITALS — BP 156/83 | HR 87 | Temp 97.4°F | Wt 232.4 lb

## 2017-07-27 DIAGNOSIS — F410 Panic disorder [episodic paroxysmal anxiety] without agoraphobia: Secondary | ICD-10-CM | POA: Diagnosis not present

## 2017-07-27 DIAGNOSIS — F200 Paranoid schizophrenia: Secondary | ICD-10-CM | POA: Diagnosis not present

## 2017-07-27 DIAGNOSIS — F331 Major depressive disorder, recurrent, moderate: Secondary | ICD-10-CM

## 2017-07-27 MED ORDER — BUSPIRONE HCL 5 MG PO TABS: 5.0000 mg | ORAL_TABLET | Freq: Three times a day (TID) | ORAL | 1 refills | Status: DC

## 2017-07-27 MED ORDER — QUETIAPINE FUMARATE 50 MG PO TABS: 75.0000 mg | ORAL_TABLET | Freq: Every day | ORAL | 1 refills | Status: DC

## 2017-07-27 NOTE — Progress Notes (Signed)
[place] MD OP Progress Note  07/27/2017 9:22 AM Vicki Morales  MRN:  [id number]  Chief Complaint:  Chief Complaint    Anxiety    ' I am having panic attacks.'  HPI: Channel is a 77 year old Caucasian female, widowed, retired, lives in [place], presented to the clinic today with her daughter.  Patient has a history of schizophrenia as well as major depressive disorder.  She used to follow-up with Dr. Einar Grad in the past.  This is her first visit with Probation officer.  Kristianna as well as daughter who was able to provide collateral information reported that patient was recently started on Seroquel.  She was on Stelazine for a very long time.  But Stelazine medication got discontinued and hence she could not access it anymore.  She reports ever since that change has been made she started noticing increased panic symptoms.  She reports her panic attacks as chest pressure, inability to breathe as well as racing heart rate.  She reports recently it has been happening more.  She reports she was advised by Dr. Einar Grad to take Seroquel 50 mg and she is also on Zoloft 50 mg which she has been on since the past several years.  She reports inspite of the change she continues to have these panic symptoms.  She denies any significant psychosocial stressors at this time.  She reports she tries to stay active around the house and does what she can.  She reports once she starts doing some activities around the house she gets the motivation and is able to do stuff.  She reports her sleep is okay.  She reports that the Seroquel 50 mg helps her with her sleep.  She denies any side effects like dizziness or grogginess on the Seroquel at this time.  She denies any perceptual disturbances.  She denies any suicidality.  She denies any other concerns.  Visit Diagnosis:    ICD-10-CM   1. Schizophrenia, paranoid ([place]) F20.0 QUEtiapine (SEROQUEL) 50 MG tablet    busPIRone (BUSPAR) 5 MG tablet  2. MDD (major depressive disorder), recurrent  episode, moderate ([facility]) F33.1 QUEtiapine (SEROQUEL) 50 MG tablet  3. Panic attacks F41.0 QUEtiapine (SEROQUEL) 50 MG tablet    busPIRone (BUSPAR) 5 MG tablet    Past Psychiatric History: Hx  of schizophrenia, MDD.  She has had multiple inpatient mental health admissions at [facility] and other hospitals in the past.  She reports 1 suicide attempt in the past.  She used to follow with Dr. Einar Grad here in clinic.  Past Medical History:  Past Medical History:  Diagnosis Date  . Anxiety   . Arthritis   . Cataract    right eye  . COPD (chronic obstructive pulmonary disease) ([place])   . Depression   . Dyspnea    DOE  . Dysrhythmia   . Edema    FEET/ANKLES  . GERD (gastroesophageal reflux disease)   . H/O wheezing   . History of hiatal hernia   . HOH (hard of hearing)   . Hypertension   . Incontinence of urine in female   . Orthopnea   . Pain    CHRONIC KNEE  . Pain    CHRONIC KNEE  . Paranoid disorder ([place])   . RLS (restless legs syndrome)   . Tremors of nervous system    HANDS    Past Surgical History:  Procedure Laterality Date  . ABDOMINAL HYSTERECTOMY  1988  . APPENDECTOMY  1988  . CARDIAC CATHETERIZATION    .  CATARACT EXTRACTION W/PHACO Right 07/14/2016   Procedure: CATARACT EXTRACTION PHACO AND INTRAOCULAR LENS PLACEMENT ([place]) anterior vitrectomy;  Surgeon: Estill Cotta, MD;  Location: [facility] ORS;  Service: Ophthalmology;  Laterality: Right;  [place] 02:40 AP% 24.4 CDE 59.98 Fluid pack # [id number]  . CATARACT EXTRACTION W/PHACO Left 02/16/2017   Procedure: CATARACT EXTRACTION PHACO AND INTRAOCULAR LENS PLACEMENT (IOC);  Surgeon: Estill Cotta, MD;  Location: [facility] ORS;  Service: Ophthalmology;  Laterality: Left;  Lot # [id number] H [place]: 01:11.8 AP%: 23.5 CDE: 32.09   . ESOPHAGOGASTRODUODENOSCOPY N/A 11/11/2014   Procedure: ESOPHAGOGASTRODUODENOSCOPY (EGD);  Surgeon: Josefine Class, MD;  Location: [facility] ENDOSCOPY;  Service: Endoscopy;  Laterality: N/A;  . TONSILLECTOMY AND  ADENOIDECTOMY    . TRIGGER FINGER RELEASE  2004    Family Psychiatric History:Father-anxiety disorder, depression, brother-anxiety disorder, sister-bipolar.  Family History:  Family History  Problem Relation Age of Onset  . Breast cancer Sister   . Stroke Father   . Emphysema Father   . Anxiety disorder Father   . Depression Father   . Anxiety disorder Brother     Social History: She is widowed.  She has 2 children a son and a daughter.  She lives with her daughter.  She is on SSI. Social History   Socioeconomic History  . Marital status: Widowed    Spouse name: None  . Number of children: None  . Years of education: None  . Highest education level: None  Social Needs  . Financial resource strain: None  . Food insecurity - worry: None  . Food insecurity - inability: None  . Transportation needs - medical: None  . Transportation needs - non-medical: None  Occupational History  . None  Tobacco Use  . Smoking status: Former Smoker    Years: 30.00  . Smokeless tobacco: Never Used  . Tobacco comment: quit in 1989  Substance and Sexual Activity  . Alcohol use: No    Alcohol/week: 0.0 oz  . Drug use: No  . Sexual activity: Not Currently  Other Topics Concern  . None  Social History Narrative  . None    Allergies:  Allergies  Allergen Reactions  . Arthrotec [Diclofenac-Misoprostol] Hives  . Codeine Other (See Comments)    Headache   . Diclofenac Other (See Comments) and Hives    Pt unsure allergy  . Hydrochlorothiazide Other (See Comments)    Weakness   . Penicillins Hives    Has patient had a PCN reaction causing immediate rash, facial/tongue/throat swelling, SOB or lightheadedness with hypotension: No Has patient had a PCN reaction causing severe rash involving mucus membranes or skin necrosis: No Has patient had a PCN reaction that required hospitalization: No Has patient had a PCN reaction occurring within the last 10 years: No If all of the above answers  are "NO", then may proceed with Cephalosporin use.  . Sulfa Antibiotics Hives  . Tiotropium Bromide Monohydrate Other (See Comments)    Blurry vision   . Tylenol [Acetaminophen] Other (See Comments)    Doesn't work  . Morphine Hives and Rash  . Pantoprazole Rash    Metabolic Disorder Labs: Lab Results  Component Value Date   HGBA1C 5.1 09/10/2015   No results found for: PROLACTIN Lab Results  Component Value Date   CHOL 207 (H) 08/20/2016   TRIG 66 08/20/2016   HDL 80 08/20/2016   CHOLHDL 2.6 08/20/2016   VLDL 12 11/01/2012   LDLCALC 114 (H) 08/20/2016   LDLCALC 132 03/27/2014   Lab Results  Component Value Date   TSH 1.303 05/12/2017   TSH 1.970 08/20/2016    Therapeutic Level Labs: No results found for: LITHIUM No results found for: VALPROATE No components found for:  CBMZ  Current Medications: Current Outpatient Medications  Medication Sig Dispense Refill  . ADVAIR DISKUS 250-50 MCG/DOSE AEPB INHALE 1 PUFF INTO THE LUNGS EVERY 12 HOURS AS NEEDED (Patient taking differently: INHALE 1 PUFF INTO THE LUNGS EVERY 12 HOURS) 1 each 12  . albuterol (PROVENTIL) (2.5 MG/3ML) 0.083% nebulizer solution Take 3 mLs (2.5 mg total) by nebulization 2 (two) times daily as needed for wheezing or shortness of breath. 75 mL 5  . aspirin EC 325 MG tablet Take 1 tablet (325 mg total) by mouth daily. (Patient taking differently: Take 325 mg by mouth at bedtime. ) 30 tablet 2  . benazepril (LOTENSIN) 5 MG tablet TAKE 1 TABLET(5 MG) BY MOUTH DAILY (Patient taking differently: TAKE 1 TABLET(5 MG) BY MOUTH DAILY at bedtime) 30 tablet 11  . Calcium Carbonate-Vitamin D (CALCIUM 600+D) 600-400 MG-UNIT tablet Take 1 tablet by mouth 2 (two) times daily.     . Cranberry 250 MG CAPS Take by mouth.    Drusilla Kanner CONCENTRATE PO Take 1 capsule by mouth daily.    . fluticasone (FLONASE) 50 MCG/ACT nasal spray Place 1 spray into both nostrils daily.    [name] Kitchen gabapentin (NEURONTIN) 100 MG capsule TAKE 1  CAPSULE(100 MG) BY MOUTH TWICE DAILY 60 capsule 11  . gabapentin (NEURONTIN) 300 MG capsule TAKE 2 CAPSULES(600 MG) BY MOUTH AT BEDTIME 60 capsule 11  . hydrOXYzine (ATARAX/VISTARIL) 25 MG tablet TAKE 1 TABLET(25 MG) BY MOUTH EVERY 8 HOURS AS NEEDED FOR ANXIETY 60 tablet 5  . loratadine (CLARITIN) 10 MG tablet Take 10 mg by mouth daily.    . magnesium oxide (MAG-OX) 400 MG tablet Take 400 mg by mouth 2 (two) times daily.    . Magnesium Oxide -Mg Supplement 250 MG TABS Take by mouth.    . meloxicam (MOBIC) 15 MG tablet TAKE 1 TABLET BY MOUTH EVERY DAY WITH A MEALS    . Misc Natural Products (OSTEO BI-FLEX ADV DOUBLE ST PO) Take 1 tablet by mouth 2 (two) times daily.     . montelukast (SINGULAIR) 10 MG tablet TAKE 1 TABLET BY MOUTH EVERY DAY 30 tablet 12  . Multiple Vitamin (MULTIVITAMIN WITH MINERALS) TABS tablet Take 1 tablet by mouth daily.    . Multiple Vitamins-Minerals (THERA-M) TABS Take by mouth.    . Omega-3 Fatty Acids (FISH OIL) 1000 MG CAPS Take 1,000 mg by mouth daily.     [name] Kitchen omeprazole (PRILOSEC) 20 MG capsule Take 20 mg by mouth daily before breakfast.     . oxybutynin (DITROPAN-XL) 10 MG 24 hr tablet TAKE 1 TABLET BY MOUTH AT BEDTIME (Patient taking differently: TAKE 1 TABLET BY MOUTH DAILY) 30 tablet 12  . Probiotic Product (ALIGN) 4 MG CAPS Take 4 mg by mouth daily.     . QUEtiapine (SEROQUEL) 50 MG tablet Take 1.5 tablets (75 mg total) by mouth at bedtime. 45 tablet 1  . ranitidine (ZANTAC) 150 MG tablet Take 150 mg by mouth at bedtime.    . sertraline (ZOLOFT) 50 MG tablet Take 1 tablet (50 mg total) by mouth at bedtime. 30 tablet 2  . theophylline (UNIPHYL) 400 MG 24 hr tablet TAKE 1 TABLET BY MOUTH DAILY. 30 tablet 12  . tiZANidine (ZANAFLEX) 4 MG tablet Take 1 tablet (4 mg total) by mouth every  8 (eight) hours as needed for muscle spasms. 30 tablet 5  . traMADol (ULTRAM) 50 MG tablet Take 2 tablets (100 mg total) by mouth 2 (two) times daily. 60 tablet 3  . busPIRone (BUSPAR)  5 MG tablet Take 1 tablet (5 mg total) by mouth 3 (three) times daily. 90 tablet 1   No current facility-administered medications for this visit.      Musculoskeletal: Strength & Muscle Tone: within normal limits Gait & Station: walks with walker Patient leans: Front  Psychiatric Specialty Exam: Review of Systems  Psychiatric/Behavioral: The patient is nervous/anxious.   All other systems reviewed and are negative.   Blood pressure (!) 156/83, pulse 87, temperature (!) 97.4 F (36.3 C), temperature source Oral, weight 232 lb 6.4 oz (105.4 kg).Body mass index is 41.17 kg/m.  General Appearance: Casual  Eye Contact:  wears dark glasses, reports she had cataract surgery recently  Speech:  Clear and Coherent  Volume:  Normal  Mood:  Anxious and Dysphoric  Affect:  Congruent  Thought Process:  Goal Directed and Descriptions of Associations: Intact  Orientation:  Full (Time, Place, and Person)  Thought Content: Logical   Suicidal Thoughts:  No  Homicidal Thoughts:  No  Memory:  Immediate;   Fair Recent;   Fair Remote;   Fair  Judgement:  Fair  Insight:  Fair  Psychomotor Activity:  Decreased  Concentration:  Concentration: Fair and Attention Span: Fair  Recall:  [employer] of Knowledge: Fair  Language: Fair  Akathisia:  No  Handed:  Right  AIMS (if indicated): 0  Assets:  Communication Skills Desire for Improvement Housing Social Support Transportation  ADL's:  Intact  Cognition: WNL  Sleep:  Fair   Screenings: PHQ2-9     Clinical Support from 08/20/2016 in [place] Visit from 03/18/2015 in [place]  PHQ-2 Total Score  0  0  PHQ-9 Total Score  7  3       Assessment and Plan: Vicki Morales is a 77 year old Caucasian female who has a history of schizophrenia, depression, presented to the clinic today for a follow-up visit.  Patient had recent medication changes done by her previous poor provider Dr. Einar Grad here in clinic.  She is  currently on Seroquel 50 mg.  She has noticed some increased panic symptoms.  Per patient as well as daughter denies any other psychosocial stressors.  Plan as noted below.  Plan MDD in full remission Continues zoloft 50 mg p.o. Daily  For panic attacks Start BuSpar 5 mg p.o. 3 times daily  For schizophrenia  Increase Seroquel to 75 mg p.o. daily.  Discussed with patient as well as daughter about the risk of side effects of sedation, drowsiness, falls.  She has social support from her daughter. Also discussed the risk of serotonin syndrome.  Provided medication education, provided handouts.  Reviewed medical records in EHR Per Dr. Einar Grad.  Follow-up in clinic in 1 month or sooner if needed.  More than 50 % of the time was spent for psychoeducation and supportive psychotherapy and care coordination.  This note was generated in part or whole with voice recognition software. Voice recognition is usually quite accurate but there are transcription errors that can and very often do occur. I apologize for any typographical errors that were not detected and corrected.       Ursula Alert, MD 07/28/2017, 9:22 AM

## 2017-07-28 ENCOUNTER — Encounter: Payer: Self-pay | Admitting: Psychiatry

## 2017-07-29 ENCOUNTER — Ambulatory Visit: Payer: Medicare HMO | Admitting: Oncology

## 2017-08-15 DIAGNOSIS — G629 Polyneuropathy, unspecified: Secondary | ICD-10-CM | POA: Diagnosis not present

## 2017-08-21 ENCOUNTER — Other Ambulatory Visit: Payer: Self-pay | Admitting: Family Medicine

## 2017-08-22 NOTE — Telephone Encounter (Signed)
Visit scheduled °

## 2017-08-24 ENCOUNTER — Ambulatory Visit: Payer: Medicare HMO | Admitting: Psychiatry

## 2017-08-29 DIAGNOSIS — G2581 Restless legs syndrome: Secondary | ICD-10-CM | POA: Diagnosis not present

## 2017-08-29 DIAGNOSIS — G608 Other hereditary and idiopathic neuropathies: Secondary | ICD-10-CM | POA: Diagnosis not present

## 2017-08-29 DIAGNOSIS — M5416 Radiculopathy, lumbar region: Secondary | ICD-10-CM | POA: Diagnosis not present

## 2017-09-05 ENCOUNTER — Ambulatory Visit (INDEPENDENT_AMBULATORY_CARE_PROVIDER_SITE_OTHER): Payer: Medicare HMO | Admitting: Psychiatry

## 2017-09-05 ENCOUNTER — Other Ambulatory Visit: Payer: Self-pay

## 2017-09-05 ENCOUNTER — Encounter: Payer: Self-pay | Admitting: Psychiatry

## 2017-09-05 VITALS — BP 178/68 | HR 89 | Temp 98.4°F | Wt 247.2 lb

## 2017-09-05 DIAGNOSIS — F410 Panic disorder [episodic paroxysmal anxiety] without agoraphobia: Secondary | ICD-10-CM | POA: Diagnosis not present

## 2017-09-05 DIAGNOSIS — F200 Paranoid schizophrenia: Secondary | ICD-10-CM | POA: Diagnosis not present

## 2017-09-05 DIAGNOSIS — F3342 Major depressive disorder, recurrent, in full remission: Secondary | ICD-10-CM

## 2017-09-05 MED ORDER — TRAZODONE HCL 50 MG PO TABS: 25.0000 mg | ORAL_TABLET | Freq: Every evening | ORAL | 2 refills | Status: DC | PRN

## 2017-09-05 MED ORDER — SERTRALINE HCL 50 MG PO TABS: 50.0000 mg | ORAL_TABLET | Freq: Every day | ORAL | 2 refills | Status: DC

## 2017-09-05 MED ORDER — QUETIAPINE FUMARATE 50 MG PO TABS: 75.0000 mg | ORAL_TABLET | Freq: Every day | ORAL | 2 refills | Status: DC

## 2017-09-05 NOTE — Progress Notes (Signed)
[facility] MD OP Progress Note  09/05/2017 4:55 PM Vicki Morales  MRN:  [id number]  Chief Complaint:  ' I am not sleeping.' Chief Complaint    Follow-up; Medication Refill     HPI: Vicki Morales is a 77 y old female, widowed, retired, lives in [place], presented to the clinic today with her daughter.  Patient has a history of schizophrenia as well as MDD.  She used to follow-up with Dr. Einar Grad in the past.  This is a second visit with Probation officer.  Patient reports she has been struggling with some sleep issues.  She reports she takes her Seroquel 75 mg but does not think that has been helping her sleep.  She reports Seroquel helps her with her mood symptoms but she continues to be restless at night.  Patient reports she has not tried trazodone in the past and would like to give it a try.  Patient reports she is otherwise doing well with her depression and anxiety symptoms.  She did not take the BuSpar as prescribed during her last visit.  She reports she feels good just on the Zoloft.  Patient however reports she wants to keep the BuSpar just in case she needs it.  She is compliant on her other medications.  She denies any side effects.  Patient reports she continues to do sudoku as well as try to do whatever  she can to keep her mind and brain working.  She continues to have good support system from her daughter. Visit Diagnosis:    ICD-10-CM   1. Schizophrenia, paranoid ([place]) F20.0 QUEtiapine (SEROQUEL) 50 MG tablet    sertraline (ZOLOFT) 50 MG tablet    traZODone (DESYREL) 50 MG tablet  2. Panic attacks F41.0 QUEtiapine (SEROQUEL) 50 MG tablet  3. MDD (major depressive disorder), recurrent, in full remission ([place]) F33.42     Past Psychiatric History: History of schizophrenia, MDD.  She has had multiple inpatient mental health admissions at [facility] and other hospitals in the past.  She reports 1 suicide attempt in the past.  She used to follow-up with Dr. Einar Grad here in clinic.  Past Medical History:   Past Medical History:  Diagnosis Date  . Anxiety   . Arthritis   . Cataract    right eye  . COPD (chronic obstructive pulmonary disease) ([place])   . Depression   . Dyspnea    DOE  . Dysrhythmia   . Edema    FEET/ANKLES  . GERD (gastroesophageal reflux disease)   . H/O wheezing   . History of hiatal hernia   . HOH (hard of hearing)   . Hypertension   . Incontinence of urine in female   . Orthopnea   . Pain    CHRONIC KNEE  . Pain    CHRONIC KNEE  . Paranoid disorder ([place])   . RLS (restless legs syndrome)   . Tremors of nervous system    HANDS    Past Surgical History:  Procedure Laterality Date  . ABDOMINAL HYSTERECTOMY  1988  . APPENDECTOMY  1988  . CARDIAC CATHETERIZATION    . CATARACT EXTRACTION W/PHACO Right 07/14/2016   Procedure: CATARACT EXTRACTION PHACO AND INTRAOCULAR LENS PLACEMENT ([place]) anterior vitrectomy;  Surgeon: Estill Cotta, MD;  Location: [facility] ORS;  Service: Ophthalmology;  Laterality: Right;  [place] 02:40 AP% 24.4 CDE 59.98 Fluid pack # [id number]  . CATARACT EXTRACTION W/PHACO Left 02/16/2017   Procedure: CATARACT EXTRACTION PHACO AND INTRAOCULAR LENS PLACEMENT (IOC);  Surgeon: Estill Cotta, MD;  Location:  [facility] ORS;  Service: Ophthalmology;  Laterality: Left;  Lot # [id number] H [place]: 01:11.8 AP%: 23.5 CDE: 32.09   . ESOPHAGOGASTRODUODENOSCOPY N/A 11/11/2014   Procedure: ESOPHAGOGASTRODUODENOSCOPY (EGD);  Surgeon: Josefine Class, MD;  Location: [facility] ENDOSCOPY;  Service: Endoscopy;  Laterality: N/A;  . TONSILLECTOMY AND ADENOIDECTOMY    . TRIGGER FINGER RELEASE  2004    Family Psychiatric History:Father -Anxiety disorder, depression, brother-anxiety disorder, sister-bipolar.  Family History:  Family History  Problem Relation Age of Onset  . Breast cancer Sister   . Stroke Father   . Emphysema Father   . Anxiety disorder Father   . Depression Father   . Anxiety disorder Brother    Substance abuse history: Denies  Social History: She is  widowed.  She has 2 children a son and a daughter.  She lives with her daughter.  She is on SSI Social History   Socioeconomic History  . Marital status: Widowed    Spouse name: Not on file  . Number of children: Not on file  . Years of education: Not on file  . Highest education level: Not on file  Occupational History  . Not on file  Social Needs  . Financial resource strain: Not on file  . Food insecurity:    Worry: Not on file    Inability: Not on file  . Transportation needs:    Medical: Not on file    Non-medical: Not on file  Tobacco Use  . Smoking status: Former Smoker    Years: 30.00  . Smokeless tobacco: Never Used  . Tobacco comment: quit in 1989  Substance and Sexual Activity  . Alcohol use: No    Alcohol/week: 0.0 oz  . Drug use: No  . Sexual activity: Not Currently  Lifestyle  . Physical activity:    Days per week: 0 days    Minutes per session: 0 min  . Stress: Very much  Relationships  . Social connections:    Talks on phone: Not on file    Gets together: Not on file    Attends religious service: Not on file    Active member of club or organization: Not on file    Attends meetings of clubs or organizations: Not on file    Relationship status: Not on file  Other Topics Concern  . Not on file  Social History Narrative  . Not on file    Allergies:  Allergies  Allergen Reactions  . Arthrotec [Diclofenac-Misoprostol] Hives  . Codeine Other (See Comments)    Headache   . Diclofenac Other (See Comments) and Hives    Pt unsure allergy  . Hydrochlorothiazide Other (See Comments)    Weakness   . Penicillins Hives    Has patient had a PCN reaction causing immediate rash, facial/tongue/throat swelling, SOB or lightheadedness with hypotension: No Has patient had a PCN reaction causing severe rash involving mucus membranes or skin necrosis: No Has patient had a PCN reaction that required hospitalization: No Has patient had a PCN reaction occurring  within the last 10 years: No If all of the above answers are "NO", then may proceed with Cephalosporin use.  . Sulfa Antibiotics Hives  . Tiotropium Bromide Monohydrate Other (See Comments)    Blurry vision   . Tylenol [Acetaminophen] Other (See Comments)    Doesn't work  . Morphine Hives and Rash  . Pantoprazole Rash    Metabolic Disorder Labs: Lab Results  Component Value Date   HGBA1C 5.1 09/10/2015   No  results found for: PROLACTIN Lab Results  Component Value Date   CHOL 207 (H) 08/20/2016   TRIG 66 08/20/2016   HDL 80 08/20/2016   CHOLHDL 2.6 08/20/2016   VLDL 12 11/01/2012   LDLCALC 114 (H) 08/20/2016   LDLCALC 132 03/27/2014   Lab Results  Component Value Date   TSH 1.303 05/12/2017   TSH 1.970 08/20/2016    Therapeutic Level Labs: No results found for: LITHIUM No results found for: VALPROATE No components found for:  CBMZ  Current Medications: Current Outpatient Medications  Medication Sig Dispense Refill  . ADVAIR DISKUS 250-50 MCG/DOSE AEPB INHALE 1 PUFF INTO THE LUNGS EVERY 12 HOURS AS NEEDED (Patient taking differently: INHALE 1 PUFF INTO THE LUNGS EVERY 12 HOURS) 1 each 12  . albuterol (PROVENTIL) (2.5 MG/3ML) 0.083% nebulizer solution Take 3 mLs (2.5 mg total) by nebulization 2 (two) times daily as needed for wheezing or shortness of breath. 75 mL 5  . aspirin EC 325 MG tablet Take 1 tablet (325 mg total) by mouth daily. (Patient taking differently: Take 325 mg by mouth at bedtime. ) 30 tablet 2  . benazepril (LOTENSIN) 5 MG tablet TAKE 1 TABLET(5 MG) BY MOUTH DAILY (Patient taking differently: TAKE 1 TABLET(5 MG) BY MOUTH DAILY at bedtime) 30 tablet 11  . busPIRone (BUSPAR) 5 MG tablet Take 1 tablet (5 mg total) by mouth 3 (three) times daily. 90 tablet 1  . Calcium Carbonate-Vitamin D (CALCIUM 600+D) 600-400 MG-UNIT tablet Take 1 tablet by mouth 2 (two) times daily.     . Cranberry 250 MG CAPS Take by mouth.    Drusilla Kanner CONCENTRATE PO Take 1 capsule  by mouth daily.    . fluticasone (FLONASE) 50 MCG/ACT nasal spray Place 1 spray into both nostrils daily.    [name] Kitchen gabapentin (NEURONTIN) 100 MG capsule TAKE 1 CAPSULE(100 MG) BY MOUTH TWICE DAILY 60 capsule 11  . gabapentin (NEURONTIN) 300 MG capsule TAKE 2 CAPSULES(600 MG) BY MOUTH AT BEDTIME 60 capsule 11  . hydrOXYzine (ATARAX/VISTARIL) 25 MG tablet TAKE 1 TABLET(25 MG) BY MOUTH EVERY 8 HOURS AS NEEDED FOR ANXIETY 60 tablet 5  . loratadine (CLARITIN) 10 MG tablet Take 10 mg by mouth daily.    . magnesium oxide (MAG-OX) 400 MG tablet Take 400 mg by mouth 2 (two) times daily.    . Magnesium Oxide -Mg Supplement 250 MG TABS Take by mouth.    . meloxicam (MOBIC) 15 MG tablet TAKE 1 TABLET BY MOUTH EVERY DAY WITH A MEALS    . Misc Natural Products (OSTEO BI-FLEX ADV DOUBLE ST PO) Take 1 tablet by mouth 2 (two) times daily.     . montelukast (SINGULAIR) 10 MG tablet TAKE 1 TABLET BY MOUTH EVERY DAY 30 tablet 12  . Multiple Vitamin (MULTIVITAMIN WITH MINERALS) TABS tablet Take 1 tablet by mouth daily.    . Multiple Vitamins-Minerals (THERA-M) TABS Take by mouth.    . Omega-3 Fatty Acids (FISH OIL) 1000 MG CAPS Take 1,000 mg by mouth daily.     [name] Kitchen omeprazole (PRILOSEC) 20 MG capsule Take 20 mg by mouth daily before breakfast.     . oxybutynin (DITROPAN-XL) 10 MG 24 hr tablet TAKE 1 TABLET BY MOUTH AT BEDTIME 30 tablet 5  . Probiotic Product (ALIGN) 4 MG CAPS Take 4 mg by mouth daily.     . QUEtiapine (SEROQUEL) 50 MG tablet Take 1.5 tablets (75 mg total) by mouth at bedtime. 45 tablet 2  . ranitidine (ZANTAC) 150  MG tablet Take 150 mg by mouth at bedtime.    . sertraline (ZOLOFT) 50 MG tablet Take 1 tablet (50 mg total) by mouth at bedtime. 30 tablet 2  . theophylline (UNIPHYL) 400 MG 24 hr tablet TAKE 1 TABLET BY MOUTH DAILY. 30 tablet 12  . tiZANidine (ZANAFLEX) 4 MG tablet Take 1 tablet (4 mg total) by mouth every 8 (eight) hours as needed for muscle spasms. 30 tablet 5  . traMADol (ULTRAM) 50 MG  tablet Take 2 tablets (100 mg total) by mouth 2 (two) times daily. 60 tablet 3  . traZODone (DESYREL) 50 MG tablet Take 0.5-1 tablets (25-50 mg total) by mouth at bedtime as needed for sleep. 30 tablet 2   No current facility-administered medications for this visit.      Musculoskeletal: Strength & Muscle Tone: within normal limits Gait & Station: normal Patient leans: N/A  Psychiatric Specialty Exam: Review of Systems  Psychiatric/Behavioral: The patient has insomnia.   All other systems reviewed and are negative.   Blood pressure (!) 178/68, pulse 89, temperature 98.4 F (36.9 C), temperature source Oral, weight 247 lb 3.2 oz (112.1 kg).Body mass index is 43.79 kg/m.  General Appearance: Casual  Eye Contact:  Fair  Speech:  Clear and Coherent  Volume:  Normal  Mood:  Euthymic  Affect:  Congruent  Thought Process:  Goal Directed and Descriptions of Associations: Intact  Orientation:  Full (Time, Place, and Person)  Thought Content: Logical   Suicidal Thoughts:  No  Homicidal Thoughts:  No  Memory:  Immediate;   Fair Recent;   Fair Remote;   Fair  Judgement:  Fair  Insight:  Fair  Psychomotor Activity:  Normal  Concentration:  Concentration: Fair and Attention Span: Fair  Recall:  [employer] of Knowledge: Fair  Language: Fair  Akathisia:  No  Handed:  Right  AIMS (if indicated): 0  Assets:  Communication Skills Desire for Improvement Social Support  ADL's:  Intact  Cognition: WNL  Sleep:  Poor   Screenings: PHQ2-9     Clinical Support from 08/20/2016 in [facility] Visit from 03/18/2015 in [place]  PHQ-2 Total Score  0  0  PHQ-9 Total Score  7  3       Assessment and Plan: Vicki Morales is a 77 year old Caucasian female who has a history of schizophrenia, depression, presented to the clinic today for a follow-up visit.  Patient continues to struggle with sleep issues.  She does not think the Seroquel 75 mg is helping her  sleep.  She otherwise denies any mood symptoms at this time.  Plan as noted below.  Plan MDD in full remission Continue Zoloft 50 mg p.o. daily  For panic attacks Continue BuSpar 5 mg as prescribed.  She reports she has not been using it but would like to keep it just in case she needs it.  For schizophrenia Continue Seroquel 75 mg p.o. daily.   AIMS = 0 Discussed with patient about the risk of side effects like sedation, drowsiness, orthostatic hypotension.  She has social support from her daughter.  For insomnia Add trazodone 25-50 mg p.o. nightly as needed Provided medication education, provided handouts.  Follow-up in clinic in 2 months or sooner if needed.  More than 50 % of the time was spent for psychoeducation and supportive psychotherapy and care coordination.  This note was generated in part or whole with voice recognition software. Voice recognition is usually quite accurate but there are  transcription errors that can and very often do occur. I apologize for any typographical errors that were not detected and corrected.        Ursula Alert, MD 09/06/2017, 8:27 AM

## 2017-09-05 NOTE — Patient Instructions (Signed)
Trazodone tablets What is this medicine? TRAZODONE (TRAZ oh done) is used to treat depression. This medicine may be used for other purposes; ask your health care provider or pharmacist if you have questions. COMMON BRAND NAME(S): Desyrel What should I tell my health care provider before I take this medicine? They need to know if you have any of these conditions: -attempted suicide or thinking about it -bipolar disorder -bleeding problems -glaucoma -heart disease, or previous heart attack -irregular heart beat -kidney or liver disease -low levels of sodium in the blood -an unusual or allergic reaction to trazodone, other medicines, foods, dyes or preservatives -pregnant or trying to get pregnant -breast-feeding How should I use this medicine? Take this medicine by mouth with a glass of water. Follow the directions on the prescription label. Take this medicine shortly after a meal or a light snack. Take your medicine at regular intervals. Do not take your medicine more often than directed. Do not stop taking this medicine suddenly except upon the advice of your doctor. Stopping this medicine too quickly may cause serious side effects or your condition may worsen. A special MedGuide will be given to you by the pharmacist with each prescription and refill. Be sure to read this information carefully each time. Talk to your pediatrician regarding the use of this medicine in children. Special care may be needed. Overdosage: If you think you have taken too much of this medicine contact a poison control center or emergency room at once. NOTE: This medicine is only for you. Do not share this medicine with others. What if I miss a dose? If you miss a dose, take it as soon as you can. If it is almost time for your next dose, take only that dose. Do not take double or extra doses. What may interact with this medicine? Do not take this medicine with any of the following medications: -certain medicines  for fungal infections like fluconazole, itraconazole, ketoconazole, posaconazole, voriconazole -cisapride -dofetilide -dronedarone -linezolid -MAOIs like Carbex, Eldepryl, Marplan, Nardil, and Parnate -mesoridazine -methylene blue (injected into a vein) -pimozide -saquinavir -thioridazine -ziprasidone This medicine may also interact with the following medications: -alcohol -antiviral medicines for HIV or AIDS -aspirin and aspirin-like medicines -barbiturates like phenobarbital -certain medicines for blood pressure, heart disease, irregular heart beat -certain medicines for depression, anxiety, or psychotic disturbances -certain medicines for migraine headache like almotriptan, eletriptan, frovatriptan, naratriptan, rizatriptan, sumatriptan, zolmitriptan -certain medicines for seizures like carbamazepine and phenytoin -certain medicines for sleep -certain medicines that treat or prevent blood clots like dalteparin, enoxaparin, warfarin -digoxin -fentanyl -lithium -NSAIDS, medicines for pain and inflammation, like ibuprofen or naproxen -other medicines that prolong the QT interval (cause an abnormal heart rhythm) -rasagiline -supplements like St. John's wort, kava kava, valerian -tramadol -tryptophan This list may not describe all possible interactions. Give your health care provider a list of all the medicines, herbs, non-prescription drugs, or dietary supplements you use. Also tell them if you smoke, drink alcohol, or use illegal drugs. Some items may interact with your medicine. What should I watch for while using this medicine? Tell your doctor if your symptoms do not get better or if they get worse. Visit your doctor or health care professional for regular checks on your progress. Because it may take several weeks to see the full effects of this medicine, it is important to continue your treatment as prescribed by your doctor. Patients and their families should watch out for new  or worsening thoughts of suicide or depression. Also   watch out for sudden changes in feelings such as feeling anxious, agitated, panicky, irritable, hostile, aggressive, impulsive, severely restless, overly excited and hyperactive, or not being able to sleep. If this happens, especially at the beginning of treatment or after a change in dose, call your health care professional. You may get drowsy or dizzy. Do not drive, use machinery, or do anything that needs mental alertness until you know how this medicine affects you. Do not stand or sit up quickly, especially if you are an older patient. This reduces the risk of dizzy or fainting spells. Alcohol may interfere with the effect of this medicine. Avoid alcoholic drinks. This medicine may cause dry eyes and blurred vision. If you wear contact lenses you may feel some discomfort. Lubricating drops may help. See your eye doctor if the problem does not go away or is severe. Your mouth may get dry. Chewing sugarless gum, sucking hard candy and drinking plenty of water may help. Contact your doctor if the problem does not go away or is severe. What side effects may I notice from receiving this medicine? Side effects that you should report to your doctor or health care professional as soon as possible: -allergic reactions like skin rash, itching or hives, swelling of the face, lips, or tongue -elevated mood, decreased need for sleep, racing thoughts, impulsive behavior -confusion -fast, irregular heartbeat -feeling faint or lightheaded, falls -feeling agitated, angry, or irritable -loss of balance or coordination -painful or prolonged erections -restlessness, pacing, inability to keep still -suicidal thoughts or other mood changes -tremors -trouble sleeping -seizures -unusual bleeding or bruising Side effects that usually do not require medical attention (report to your doctor or health care professional if they continue or are bothersome): -change in  sex drive or performance -change in appetite or weight -constipation -headache -muscle aches or pains -nausea This list may not describe all possible side effects. Call your doctor for medical advice about side effects. You may report side effects to FDA at 1-800-FDA-1088. Where should I keep my medicine? Keep out of the reach of children. Store at room temperature between 15 and 30 degrees C (59 to 86 degrees F). Protect from light. Keep container tightly closed. Throw away any unused medicine after the expiration date. NOTE: This sheet is a summary. It may not cover all possible information. If you have questions about this medicine, talk to your doctor, pharmacist, or health care provider.  2018 Elsevier/Gold Standard (2015-10-23 16:57:05)  

## 2017-09-06 ENCOUNTER — Encounter: Payer: Self-pay | Admitting: Psychiatry

## 2017-09-08 ENCOUNTER — Ambulatory Visit: Payer: Self-pay | Admitting: Podiatry

## 2017-09-15 ENCOUNTER — Inpatient Hospital Stay: Payer: Medicare HMO | Admitting: Oncology

## 2017-09-20 ENCOUNTER — Ambulatory Visit (INDEPENDENT_AMBULATORY_CARE_PROVIDER_SITE_OTHER): Payer: Medicare HMO

## 2017-09-20 VITALS — BP 136/62 | HR 72 | Temp 97.9°F | Ht 63.0 in

## 2017-09-20 DIAGNOSIS — Z0000 Encounter for general adult medical examination without abnormal findings: Secondary | ICD-10-CM | POA: Diagnosis not present

## 2017-09-20 DIAGNOSIS — Z13820 Encounter for screening for osteoporosis: Secondary | ICD-10-CM | POA: Diagnosis not present

## 2017-09-20 NOTE — Patient Instructions (Signed)
Vicki Morales , Thank you for taking time to come for your Medicare Wellness Visit. I appreciate your ongoing commitment to your health goals. Please review the following plan we discussed and let me know if I can assist you in the future.   Screening recommendations/referrals: Colonoscopy: Up to date Mammogram: Up to date Bone Density: Referral sent today.  Recommended yearly ophthalmology/optometry visit for glaucoma screening and checkup Recommended yearly dental visit for hygiene and checkup  Vaccinations: Influenza vaccine: Up to date Pneumococcal vaccine: Up to date Tdap vaccine: Up to date Shingles vaccine: Pt declines today.     Advanced directives: Already on file.  Conditions/risks identified: Obesity- recommend trying to eat 3 meals a day every day and not skip any meals.   Next appointment: 11/08/17 @ 2 PM with Dr Caryn Section.   Preventive Care 78 Years and Older, Female Preventive care refers to lifestyle choices and visits with your health care provider that can promote health and wellness. What does preventive care include?  A yearly physical exam. This is also called an annual well check.  Dental exams once or twice a year.  Routine eye exams. Ask your health care provider how often you should have your eyes checked.  Personal lifestyle choices, including:  Daily care of your teeth and gums.  Regular physical activity.  Eating a healthy diet.  Avoiding tobacco and drug use.  Limiting alcohol use.  Practicing safe sex.  Taking low-dose aspirin every day.  Taking vitamin and mineral supplements as recommended by your health care provider. What happens during an annual well check? The services and screenings done by your health care provider during your annual well check will depend on your age, overall health, lifestyle risk factors, and family history of disease. Counseling  Your health care provider may ask you questions about your:  Alcohol  use.  Tobacco use.  Drug use.  Emotional well-being.  Home and relationship well-being.  Sexual activity.  Eating habits.  History of falls.  Memory and ability to understand (cognition).  Work and work Statistician.  Reproductive health. Screening  You may have the following tests or measurements:  Height, weight, and BMI.  Blood pressure.  Lipid and cholesterol levels. These may be checked every 5 years, or more frequently if you are over 55 years old.  Skin check.  Lung cancer screening. You may have this screening every year starting at age 78 if you have a 30-pack-year history of smoking and currently smoke or have quit within the past 15 years.  Fecal occult blood test (FOBT) of the stool. You may have this test every year starting at age 68.  Flexible sigmoidoscopy or colonoscopy. You may have a sigmoidoscopy every 5 years or a colonoscopy every 10 years starting at age 78.  Hepatitis C blood test.  Hepatitis B blood test.  Sexually transmitted disease (STD) testing.  Diabetes screening. This is done by checking your blood sugar (glucose) after you have not eaten for a while (fasting). You may have this done every 1-3 years.  Bone density scan. This is done to screen for osteoporosis. You may have this done starting at age 78.  Mammogram. This may be done every 1-2 years. Talk to your health care provider about how often you should have regular mammograms. Talk with your health care provider about your test results, treatment options, and if necessary, the need for more tests. Vaccines  Your health care provider may recommend certain vaccines, such as:  Influenza vaccine.  This is recommended every year.  Tetanus, diphtheria, and acellular pertussis (Tdap, Td) vaccine. You may need a Td booster every 10 years.  Zoster vaccine. You may need this after age 67.  Pneumococcal 13-valent conjugate (PCV13) vaccine. One dose is recommended after age  78.  Pneumococcal polysaccharide (PPSV23) vaccine. One dose is recommended after age 78. Talk to your health care provider about which screenings and vaccines you need and how often you need them. This information is not intended to replace advice given to you by your health care provider. Make sure you discuss any questions you have with your health care provider. Document Released: 06/20/2015 Document Revised: 02/11/2016 Document Reviewed: 03/25/2015 Elsevier Interactive Patient Education  2017 [place] Prevention in the Home Falls can cause injuries. They can happen to people of all ages. There are many things you can do to make your home safe and to help prevent falls. What can I do on the outside of my home?  Regularly fix the edges of walkways and driveways and fix any cracks.  Remove anything that might make you trip as you walk through a door, such as a raised step or threshold.  Trim any bushes or trees on the path to your home.  Use bright outdoor lighting.  Clear any walking paths of anything that might make someone trip, such as rocks or tools.  Regularly check to see if handrails are loose or broken. Make sure that both sides of any steps have handrails.  Any raised decks and porches should have guardrails on the edges.  Have any leaves, snow, or ice cleared regularly.  Use sand or salt on walking paths during winter.  Clean up any spills in your garage right away. This includes oil or grease spills. What can I do in the bathroom?  Use night lights.  Install grab bars by the toilet and in the tub and shower. Do not use towel bars as grab bars.  Use non-skid mats or decals in the tub or shower.  If you need to sit down in the shower, use a plastic, non-slip stool.  Keep the floor dry. Clean up any water that spills on the floor as soon as it happens.  Remove soap buildup in the tub or shower regularly.  Attach bath mats securely with double-sided  non-slip rug tape.  Do not have throw rugs and other things on the floor that can make you trip. What can I do in the bedroom?  Use night lights.  Make sure that you have a light by your bed that is easy to reach.  Do not use any sheets or blankets that are too big for your bed. They should not hang down onto the floor.  Have a firm chair that has side arms. You can use this for support while you get dressed.  Do not have throw rugs and other things on the floor that can make you trip. What can I do in the kitchen?  Clean up any spills right away.  Avoid walking on wet floors.  Keep items that you use a lot in easy-to-reach places.  If you need to reach something above you, use a strong step stool that has a grab bar.  Keep electrical cords out of the way.  Do not use floor polish or wax that makes floors slippery. If you must use wax, use non-skid floor wax.  Do not have throw rugs and other things on the floor that can make  you trip. What can I do with my stairs?  Do not leave any items on the stairs.  Make sure that there are handrails on both sides of the stairs and use them. Fix handrails that are broken or loose. Make sure that handrails are as long as the stairways.  Check any carpeting to make sure that it is firmly attached to the stairs. Fix any carpet that is loose or worn.  Avoid having throw rugs at the top or bottom of the stairs. If you do have throw rugs, attach them to the floor with carpet tape.  Make sure that you have a light switch at the top of the stairs and the bottom of the stairs. If you do not have them, ask someone to add them for you. What else can I do to help prevent falls?  Wear shoes that:  Do not have high heels.  Have rubber bottoms.  Are comfortable and fit you well.  Are closed at the toe. Do not wear sandals.  If you use a stepladder:  Make sure that it is fully opened. Do not climb a closed stepladder.  Make sure that both  sides of the stepladder are locked into place.  Ask someone to hold it for you, if possible.  Clearly mark and make sure that you can see:  Any grab bars or handrails.  First and last steps.  Where the edge of each step is.  Use tools that help you move around (mobility aids) if they are needed. These include:  Canes.  Walkers.  Scooters.  Crutches.  Turn on the lights when you go into a dark area. Replace any light bulbs as soon as they burn out.  Set up your furniture so you have a clear path. Avoid moving your furniture around.  If any of your floors are uneven, fix them.  If there are any pets around you, be aware of where they are.  Review your medicines with your doctor. Some medicines can make you feel dizzy. This can increase your chance of falling. Ask your doctor what other things that you can do to help prevent falls. This information is not intended to replace advice given to you by your health care provider. Make sure you discuss any questions you have with your health care provider. Document Released: 03/20/2009 Document Revised: 10/30/2015 Document Reviewed: 06/28/2014 Elsevier Interactive Patient Education  2017 [employer].

## 2017-09-20 NOTE — Progress Notes (Signed)
Subjective:   Vicki Morales is a 77 y.o. female who presents for Medicare Annual (Subsequent) preventive examination.  Review of Systems:  N/A  Cardiac Risk Factors include: advanced age (>34men, >65 women);obesity (BMI >30kg/m2);hypertension     Objective:     Vitals: BP 136/62 (BP Location: Left Arm)   Pulse 72   Temp 97.9 F (36.6 C) (Oral)   Ht 5\' 3"  (1.6 m)   BMI 43.79 kg/m   Body mass index is 43.79 kg/m.  Advanced Directives 09/20/2017 05/12/2017 08/20/2016 07/14/2016 03/16/2016 12/24/2015 09/23/2015  Does Patient Have a Medical Advance Directive? Yes Yes Yes No No Yes Yes  Type of Paramedic of [place];Living will - Living will;Healthcare Power of Attorney - - Living will Living will  Does patient want to make changes to medical advance directive? - No - Patient declined - - - - -  Copy of [place] in Chart? Yes - Yes - - - -  Would patient like information on creating a medical advance directive? - - - - No - patient declined information - -  Some encounter information is confidential and restricted. Go to Review Flowsheets activity to see all data.    Tobacco Social History   Tobacco Use  Smoking Status Former Smoker  . Years: 30.00  Smokeless Tobacco Never Used  Tobacco Comment   quit in 1989     Counseling given: Not Answered Comment: quit in 1989   Clinical Intake:  Pre-visit preparation completed: Yes  Pain : No/denies pain Pain Score: 0-No pain     Nutritional Status: BMI > 30  Obese Nutritional Risks: Nausea/ vomitting/ diarrhea(nausea occasionally due to medication changes) Diabetes: No  How often do you need to have someone help you when you read instructions, pamphlets, or other written materials from your doctor or pharmacy?: 1 - Never  Interpreter Needed?: No  Information entered by :: [facility], LPN  Past Medical History:  Diagnosis Date  . Anxiety   . Arthritis   . Cataract    right  eye  . COPD (chronic obstructive pulmonary disease) ([place])   . Depression   . Dyspnea    DOE  . Dysrhythmia   . Edema    FEET/ANKLES  . GERD (gastroesophageal reflux disease)   . H/O wheezing   . History of hiatal hernia   . HOH (hard of hearing)   . Hypertension   . Incontinence of urine in female   . Orthopnea   . Pain    CHRONIC KNEE  . Pain    CHRONIC KNEE  . Paranoid disorder ([place])   . RLS (restless legs syndrome)   . Tremors of nervous system    HANDS   Past Surgical History:  Procedure Laterality Date  . ABDOMINAL HYSTERECTOMY  1988  . APPENDECTOMY  1988  . CARDIAC CATHETERIZATION    . CATARACT EXTRACTION W/PHACO Right 07/14/2016   Procedure: CATARACT EXTRACTION PHACO AND INTRAOCULAR LENS PLACEMENT ([place]) anterior vitrectomy;  Surgeon: Estill Cotta, MD;  Location: [facility] ORS;  Service: Ophthalmology;  Laterality: Right;  [place] 02:40 AP% 24.4 CDE 59.98 Fluid pack # [id number]  . CATARACT EXTRACTION W/PHACO Left 02/16/2017   Procedure: CATARACT EXTRACTION PHACO AND INTRAOCULAR LENS PLACEMENT (IOC);  Surgeon: Estill Cotta, MD;  Location: [facility] ORS;  Service: Ophthalmology;  Laterality: Left;  Lot # [id number] H [place]: 01:11.8 AP%: 23.5 CDE: 32.09   . ESOPHAGOGASTRODUODENOSCOPY N/A 11/11/2014   Procedure: ESOPHAGOGASTRODUODENOSCOPY (EGD);  Surgeon: Josefine Class,  MD;  Location: [facility] ENDOSCOPY;  Service: Endoscopy;  Laterality: N/A;  . TONSILLECTOMY AND ADENOIDECTOMY    . TRIGGER FINGER RELEASE  2004   Family History  Problem Relation Age of Onset  . Breast cancer Sister   . Stroke Father   . Emphysema Father   . Anxiety disorder Father   . Depression Father   . Anxiety disorder Brother    Social History   Socioeconomic History  . Marital status: Widowed    Spouse name: Not on file  . Number of children: 2  . Years of education: Not on file  . Highest education level: 12th grade  Occupational History  . 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  Financial resource strain: Not hard at all  . Food insecurity:    Worry: Never true    Inability: Never true  . Transportation needs:    Medical: No    Non-medical: No  Tobacco Use  . Smoking status: Former Smoker    Years: 30.00  . Smokeless tobacco: Never Used  . Tobacco comment: quit in 1989  Substance and Sexual Activity  . Alcohol use: No    Alcohol/week: 0.0 oz  . Drug use: No  . Sexual activity: Not Currently  Lifestyle  . Physical activity:    Days per week: 0 days    Minutes per session: 0 min  . Stress: Only a little  Relationships  . Social connections:    Talks on phone: Not on file    Gets together: Not on file    Attends religious service: Not on file    Active member of club or organization: Not on file    Attends meetings of clubs or organizations: Not on file    Relationship status: Not on file  Other Topics Concern  . Not on file  Social History Narrative  . Not on file    Outpatient Encounter Medications as of 09/20/2017  Medication Sig  . ADVAIR DISKUS 250-50 MCG/DOSE AEPB INHALE 1 PUFF INTO THE LUNGS EVERY 12 HOURS AS NEEDED (Patient taking differently: INHALE 1 PUFF INTO THE LUNGS EVERY 12 HOURS)  . albuterol (PROVENTIL) (2.5 MG/3ML) 0.083% nebulizer solution Take 3 mLs (2.5 mg total) by nebulization 2 (two) times daily as needed for wheezing or shortness of breath.  [name] Kitchen aspirin EC 325 MG tablet Take 1 tablet (325 mg total) by mouth daily. (Patient taking differently: Take 325 mg by mouth at bedtime. )  . benazepril (LOTENSIN) 5 MG tablet TAKE 1 TABLET(5 MG) BY MOUTH DAILY (Patient taking differently: TAKE 1 TABLET(5 MG) BY MOUTH DAILY at bedtime)  . busPIRone (BUSPAR) 5 MG tablet Take 1 tablet (5 mg total) by mouth 3 (three) times daily.  . Calcium Carbonate-Vitamin D (CALCIUM 600+D) 600-400 MG-UNIT tablet Take 1 tablet by mouth 2 (two) times daily.   . Cranberry 250 MG CAPS Take by mouth.  Drusilla Kanner CONCENTRATE PO Take 1 capsule by mouth daily.  .  fluticasone (FLONASE) 50 MCG/ACT nasal spray Place 1 spray into both nostrils daily.  [name] Kitchen gabapentin (NEURONTIN) 100 MG capsule TAKE 1 CAPSULE(100 MG) BY MOUTH TWICE DAILY  . gabapentin (NEURONTIN) 300 MG capsule TAKE 2 CAPSULES(600 MG) BY MOUTH AT BEDTIME  . hydrOXYzine (ATARAX/VISTARIL) 25 MG tablet TAKE 1 TABLET(25 MG) BY MOUTH EVERY 8 HOURS AS NEEDED FOR ANXIETY  . loratadine (CLARITIN) 10 MG tablet Take 10 mg by mouth daily.  . magnesium oxide (MAG-OX) 400 MG tablet Take 400  mg by mouth 2 (two) times daily.  . Magnesium Oxide -Mg Supplement 250 MG TABS Take by mouth.  . meloxicam (MOBIC) 15 MG tablet TAKE 1 TABLET BY MOUTH EVERY DAY WITH A MEALS  . Misc Natural Products (OSTEO BI-FLEX ADV DOUBLE ST PO) Take 1 tablet by mouth 2 (two) times daily.   . montelukast (SINGULAIR) 10 MG tablet TAKE 1 TABLET BY MOUTH EVERY DAY  . Multiple Vitamin (MULTIVITAMIN WITH MINERALS) TABS tablet Take 1 tablet by mouth daily.  . Multiple Vitamins-Minerals (THERA-M) TABS Take by mouth.  . Omega-3 Fatty Acids (FISH OIL) 1000 MG CAPS Take 1,000 mg by mouth daily.   [name] Kitchen omeprazole (PRILOSEC) 20 MG capsule Take 20 mg by mouth daily before breakfast.   . oxybutynin (DITROPAN-XL) 10 MG 24 hr tablet TAKE 1 TABLET BY MOUTH AT BEDTIME  . Probiotic Product (ALIGN) 4 MG CAPS Take 4 mg by mouth daily.   . QUEtiapine (SEROQUEL) 50 MG tablet Take 1.5 tablets (75 mg total) by mouth at bedtime. (Patient taking differently: Take 75 mg by mouth at bedtime. )  . ranitidine (ZANTAC) 150 MG tablet Take 150 mg by mouth at bedtime.  . sertraline (ZOLOFT) 50 MG tablet Take 1 tablet (50 mg total) by mouth at bedtime.  . theophylline (UNIPHYL) 400 MG 24 hr tablet TAKE 1 TABLET BY MOUTH DAILY.  [name] Kitchen tiZANidine (ZANAFLEX) 4 MG tablet Take 1 tablet (4 mg total) by mouth every 8 (eight) hours as needed for muscle spasms.  . traMADol (ULTRAM) 50 MG tablet Take 2 tablets (100 mg total) by mouth 2 (two) times daily.  . traZODone (DESYREL) 50 MG  tablet Take 0.5-1 tablets (25-50 mg total) by mouth at bedtime as needed for sleep.   No facility-administered encounter medications on file as of 09/20/2017.     Activities of Daily Living In your present state of health, do you have any difficulty performing the following activities: 09/20/2017  Hearing? Y  Comment Not interested in hearing aids.   Vision? N  Difficulty concentrating or making decisions? N  Walking or climbing stairs? N  Dressing or bathing? Y  Doing errands, shopping? N  Preparing Food and eating ? Y  Comment Daughter cooks  Using the Toilet? N  In the past six months, have you accidently leaked urine? Y  Comment Incontinent- wears depends.  Do you have problems with loss of bowel control? N  Managing your Medications? Y  Comment Daughter manages this for pt.  Managing your Finances? Y  Comment Daughter takes care of.  Housekeeping or managing your Housekeeping? Y  Comment Daughter cleans around home.  Some recent data might be hidden    Patient Care Team: Birdie Sons, MD as PCP - General (Family Medicine) Dingeldein, Remo Lipps, MD as Consulting Physician (Ophthalmology) Erby Pian, MD as Referring Physician (Specialist) Teodoro Spray, MD as Consulting Physician (Cardiology) Ursula Alert, MD as Consulting Physician (Psychiatry)    Assessment:   This is a routine wellness examination for [place].  Exercise Activities and Dietary recommendations Current Exercise Habits: The patient does not participate in regular exercise at present(Occasionally stretches and doing Nustep.)  Goals    None      Fall Risk Fall Risk  09/20/2017 08/20/2016 03/18/2015  Falls in the past year? No No No   Is the patient's home free of loose throw rugs in walkways, pet beds, electrical cords, etc?   yes      Grab bars in the bathroom? yes  Handrails on the stairs?   no      Adequate lighting?   yes  Timed Get Up and Go performed: N/A  Depression  Screen PHQ 2/9 Scores 09/20/2017 09/20/2017 08/20/2016 08/20/2016  PHQ - 2 Score 1 1 0 0  PHQ- 9 Score 7 - 7 -     Cognitive Function     6CIT Screen 09/20/2017 08/20/2016  What Year? 0 points 0 points  What month? 0 points 0 points  What time? 0 points 0 points  Count back from 20 0 points 0 points  Months in reverse 0 points 0 points  Repeat phrase 0 points 0 points  Total Score 0 0    Immunization History  Administered Date(s) Administered  . Influenza Split 04/03/2008, 03/31/2009, 02/20/2010, 02/24/2011  . Influenza, High Dose Seasonal PF 02/25/2014, 03/18/2015, 03/24/2017  . Influenza,inj,Quad PF,6+ Mos 03/31/2013, 03/19/2016  . Pneumococcal Conjugate-13 02/25/2014  . Pneumococcal Polysaccharide-23 12/06/1999, 04/15/2005  . Tdap 05/07/2011    Qualifies for Shingles Vaccine? Due for Shingles vaccine. Declined my offer to administer today. Education has been provided regarding the importance of this vaccine. Pt has been advised to call her insurance company to determine her out of pocket expense. Advised she may also receive this vaccine at her local pharmacy or Health Dept. Verbalized acceptance and understanding.  Screening Tests Health Maintenance  Topic Date Due  . DEXA SCAN  05/01/2016  . INFLUENZA VACCINE  01/05/2018  . TETANUS/TDAP  05/06/2021  . PNA vac Low Risk Adult  Completed    Cancer Screenings: Lung: Low Dose CT Chest recommended if Age 57-80 years, 30 pack-year currently smoking OR have quit w/in 15years. Patient does not qualify. Breast:  Up to date on Mammogram? Yes   Up to date of Bone Density/Dexa? Yes Colorectal: Up to date  Additional Screenings:  Hepatitis C Screening: N/A     Plan:  I have personally reviewed and addressed the Medicare Annual Wellness questionnaire and have noted the following in the patient's chart:  A. Medical and social history B. Use of alcohol, tobacco or illicit drugs  C. Current medications and  supplements D. Functional ability and status E.  Nutritional status F.  Physical activity G. Advance directives H. List of other physicians I.  Hospitalizations, surgeries, and ER visits in previous 12 months J.  [place] such as hearing and vision if needed, cognitive and depression L. Referrals and appointments - none  In addition, I have reviewed and discussed with patient certain preventive protocols, quality metrics, and best practice recommendations. A written personalized care plan for preventive services as well as general preventive health recommendations were provided to patient.  See attached scanned questionnaire for additional information.   Signed,  Fabio Neighbors, LPN Nurse Health Advisor   Nurse Recommendations: None. DEXA referral sent today.

## 2017-10-13 ENCOUNTER — Encounter: Payer: Self-pay | Admitting: Psychiatry

## 2017-10-13 ENCOUNTER — Ambulatory Visit (INDEPENDENT_AMBULATORY_CARE_PROVIDER_SITE_OTHER): Payer: Medicare HMO | Admitting: Psychiatry

## 2017-10-13 DIAGNOSIS — F410 Panic disorder [episodic paroxysmal anxiety] without agoraphobia: Secondary | ICD-10-CM | POA: Diagnosis not present

## 2017-10-13 DIAGNOSIS — F200 Paranoid schizophrenia: Secondary | ICD-10-CM | POA: Diagnosis not present

## 2017-10-13 NOTE — Progress Notes (Signed)
Patient ID: Vicki Morales, female   DOB: June 06, 1941, 77 y.o.   MRN: [id number]   Patient's daughter presented today to discuss patient's problems.  She reported that patient has been having some trouble sleeping and appears to lack interest in the daily activities that she would normally do . she reports this has been going on since the past few weeks.  She reports last night her mother did not sleep and hence did not want to come today for the appointment.  Per daughter she hence decided to come in to discuss her care.  Per patient her healthcare can be discussed with her daughter and she has signed a consent. Per daughter patient may have started decompensating after stopping the Stelazine.  Patient was unable to get Stelazine and hence was started on Seroquel.  Patient was doing well for a while however started decompensating. Per daughter patient is not suicidal or homicidal at this time.She also does not think she is psychotic. The last 1 week she has seen that she may be improving a little bit except for her sleep problem last night. Discussed with daughter to monitor her mother closely and to take her to the nearest emergency department if she continues to decompensate.  Discussed with her to bring patient to  clinic as soon as possible if she needs medication readjustment.

## 2017-10-24 ENCOUNTER — Other Ambulatory Visit: Payer: Medicare HMO

## 2017-10-24 ENCOUNTER — Emergency Department: Payer: Medicare HMO

## 2017-10-24 ENCOUNTER — Inpatient Hospital Stay
Admission: EM | Admit: 2017-10-24 | Discharge: 2017-10-26 | DRG: 190 | Disposition: A | Discharge status: Home / Self Care | Payer: Medicare HMO | Attending: Internal Medicine | Admitting: Internal Medicine

## 2017-10-24 ENCOUNTER — Other Ambulatory Visit: Payer: Self-pay

## 2017-10-24 DIAGNOSIS — J441 Chronic obstructive pulmonary disease with (acute) exacerbation: Secondary | ICD-10-CM | POA: Diagnosis not present

## 2017-10-24 DIAGNOSIS — Z825 Family history of asthma and other chronic lower respiratory diseases: Secondary | ICD-10-CM

## 2017-10-24 DIAGNOSIS — Z882 Allergy status to sulfonamides status: Secondary | ICD-10-CM | POA: Diagnosis not present

## 2017-10-24 DIAGNOSIS — Z66 Do not resuscitate: Secondary | ICD-10-CM | POA: Diagnosis present

## 2017-10-24 DIAGNOSIS — Z961 Presence of intraocular lens: Secondary | ICD-10-CM | POA: Diagnosis present

## 2017-10-24 DIAGNOSIS — G2581 Restless legs syndrome: Secondary | ICD-10-CM | POA: Diagnosis not present

## 2017-10-24 DIAGNOSIS — J9601 Acute respiratory failure with hypoxia: Secondary | ICD-10-CM | POA: Diagnosis present

## 2017-10-24 DIAGNOSIS — Z803 Family history of malignant neoplasm of breast: Secondary | ICD-10-CM | POA: Diagnosis not present

## 2017-10-24 DIAGNOSIS — K219 Gastro-esophageal reflux disease without esophagitis: Secondary | ICD-10-CM | POA: Diagnosis present

## 2017-10-24 DIAGNOSIS — Z90710 Acquired absence of both cervix and uterus: Secondary | ICD-10-CM | POA: Diagnosis not present

## 2017-10-24 DIAGNOSIS — H9190 Unspecified hearing loss, unspecified ear: Secondary | ICD-10-CM | POA: Diagnosis present

## 2017-10-24 DIAGNOSIS — Z818 Family history of other mental and behavioral disorders: Secondary | ICD-10-CM

## 2017-10-24 DIAGNOSIS — Z9842 Cataract extraction status, left eye: Secondary | ICD-10-CM

## 2017-10-24 DIAGNOSIS — Z888 Allergy status to other drugs, medicaments and biological substances status: Secondary | ICD-10-CM

## 2017-10-24 DIAGNOSIS — Z7982 Long term (current) use of aspirin: Secondary | ICD-10-CM

## 2017-10-24 DIAGNOSIS — F329 Major depressive disorder, single episode, unspecified: Secondary | ICD-10-CM | POA: Diagnosis not present

## 2017-10-24 DIAGNOSIS — G43909 Migraine, unspecified, not intractable, without status migrainosus: Secondary | ICD-10-CM | POA: Diagnosis present

## 2017-10-24 DIAGNOSIS — Z880 Allergy status to penicillin: Secondary | ICD-10-CM | POA: Diagnosis not present

## 2017-10-24 DIAGNOSIS — R0602 Shortness of breath: Secondary | ICD-10-CM | POA: Diagnosis not present

## 2017-10-24 DIAGNOSIS — I10 Essential (primary) hypertension: Secondary | ICD-10-CM | POA: Diagnosis present

## 2017-10-24 DIAGNOSIS — Z6841 Body Mass Index (BMI) 40.0 and over, adult: Secondary | ICD-10-CM

## 2017-10-24 DIAGNOSIS — Z7951 Long term (current) use of inhaled steroids: Secondary | ICD-10-CM

## 2017-10-24 DIAGNOSIS — Z885 Allergy status to narcotic agent status: Secondary | ICD-10-CM | POA: Diagnosis not present

## 2017-10-24 DIAGNOSIS — Z886 Allergy status to analgesic agent status: Secondary | ICD-10-CM | POA: Diagnosis not present

## 2017-10-24 DIAGNOSIS — Z9841 Cataract extraction status, right eye: Secondary | ICD-10-CM

## 2017-10-24 DIAGNOSIS — G629 Polyneuropathy, unspecified: Secondary | ICD-10-CM | POA: Diagnosis not present

## 2017-10-24 DIAGNOSIS — Z87891 Personal history of nicotine dependence: Secondary | ICD-10-CM | POA: Diagnosis not present

## 2017-10-24 DIAGNOSIS — Z791 Long term (current) use of non-steroidal anti-inflammatories (NSAID): Secondary | ICD-10-CM

## 2017-10-24 DIAGNOSIS — R079 Chest pain, unspecified: Secondary | ICD-10-CM | POA: Diagnosis not present

## 2017-10-24 LAB — COMPREHENSIVE METABOLIC PANEL
ALK PHOS: 77 U/L (ref 38–126)
ALT: 13 U/L — ABNORMAL LOW (ref 14–54)
ANION GAP: 10 (ref 5–15)
AST: 17 U/L (ref 15–41)
Albumin: 3.5 g/dL (ref 3.5–5.0)
BILIRUBIN TOTAL: 0.7 mg/dL (ref 0.3–1.2)
BUN: 21 mg/dL — ABNORMAL HIGH (ref 6–20)
CALCIUM: 8.5 mg/dL — AB (ref 8.9–10.3)
CO2: 25 mmol/L (ref 22–32)
CREATININE: 0.57 mg/dL (ref 0.44–1.00)
Chloride: 105 mmol/L (ref 101–111)
Glucose, Bld: 91 mg/dL (ref 65–99)
Potassium: 4 mmol/L (ref 3.5–5.1)
Sodium: 140 mmol/L (ref 135–145)
TOTAL PROTEIN: 5.4 g/dL — AB (ref 6.5–8.1)

## 2017-10-24 LAB — CBC
HCT: 38.1 % (ref 35.0–47.0)
Hemoglobin: 12.9 g/dL (ref 12.0–16.0)
MCH: 31.6 pg (ref 26.0–34.0)
MCHC: 33.8 g/dL (ref 32.0–36.0)
MCV: 93.3 fL (ref 80.0–100.0)
PLATELETS: 169 10*3/uL (ref 150–440)
RBC: 4.08 MIL/uL (ref 3.80–5.20)
RDW: 14.9 % — AB (ref 11.5–14.5)
WBC: 5.6 10*3/uL (ref 3.6–11.0)

## 2017-10-24 LAB — TROPONIN I: Troponin I: <0.03 ng/mL (ref <0.03)

## 2017-10-24 MED ORDER — DOCUSATE SODIUM 100 MG PO CAPS: 100.0000 mg | ORAL_CAPSULE | Freq: Two times a day (BID) | ORAL | Status: DC | PRN

## 2017-10-24 MED ORDER — OXYBUTYNIN CHLORIDE ER 5 MG PO TB24
10.0000 mg | ORAL_TABLET | Freq: Every day | ORAL | Status: DC
Start: 2017-10-24 — End: 2017-10-26
  Administered 2017-10-24 – 2017-10-25 (×2): 10 mg via ORAL
  Filled 2017-10-24 (×2): qty 2

## 2017-10-24 MED ORDER — THEOPHYLLINE ER 400 MG PO TB24
400.0000 mg | ORAL_TABLET | Freq: Every day | ORAL | Status: DC
  Administered 2017-10-25 – 2017-10-26 (×2): 400 mg via ORAL
  Filled 2017-10-24 (×3): qty 1

## 2017-10-24 MED ORDER — METHYLPREDNISOLONE SODIUM SUCC 125 MG IJ SOLR
60.0000 mg | Freq: Four times a day (QID) | INTRAMUSCULAR | Status: DC
  Administered 2017-10-24 – 2017-10-25 (×3): 60 mg via INTRAVENOUS
  Filled 2017-10-24 (×3): qty 2

## 2017-10-24 MED ORDER — PANTOPRAZOLE SODIUM 40 MG PO TBEC
40.0000 mg | DELAYED_RELEASE_TABLET | Freq: Every day | ORAL | Status: DC
  Administered 2017-10-25 – 2017-10-26 (×2): 40 mg via ORAL
  Filled 2017-10-24 (×2): qty 1

## 2017-10-24 MED ORDER — SERTRALINE HCL 50 MG PO TABS
50.0000 mg | ORAL_TABLET | Freq: Every day | ORAL | Status: DC
  Administered 2017-10-24 – 2017-10-25 (×2): 50 mg via ORAL
  Filled 2017-10-24 (×2): qty 1

## 2017-10-24 MED ORDER — ASPIRIN EC 325 MG PO TBEC
325.0000 mg | DELAYED_RELEASE_TABLET | Freq: Every day | ORAL | Status: DC
  Administered 2017-10-24 – 2017-10-25 (×2): 325 mg via ORAL
  Filled 2017-10-24 (×2): qty 1

## 2017-10-24 MED ORDER — ALBUTEROL SULFATE (2.5 MG/3ML) 0.083% IN NEBU
2.5000 mg | INHALATION_SOLUTION | Freq: Once | RESPIRATORY_TRACT | Status: AC
  Administered 2017-10-24: 2.5 mg via RESPIRATORY_TRACT
  Filled 2017-10-24: qty 3

## 2017-10-24 MED ORDER — LEVOFLOXACIN 500 MG PO TABS
500.0000 mg | ORAL_TABLET | Freq: Every day | ORAL | Status: DC
  Administered 2017-10-25 – 2017-10-26 (×2): 500 mg via ORAL
  Filled 2017-10-24 (×2): qty 1

## 2017-10-24 MED ORDER — HALOPERIDOL LACTATE 5 MG/ML IJ SOLN: 1.0000 mg | Freq: Once | INTRAMUSCULAR | Status: DC

## 2017-10-24 MED ORDER — CALCIUM CARBONATE-VITAMIN D 500-200 MG-UNIT PO TABS
1.0000 | ORAL_TABLET | Freq: Two times a day (BID) | ORAL | Status: DC
  Administered 2017-10-24 – 2017-10-26 (×4): 1 via ORAL
  Filled 2017-10-24 (×4): qty 1

## 2017-10-24 MED ORDER — MONTELUKAST SODIUM 10 MG PO TABS
10.0000 mg | ORAL_TABLET | Freq: Every day | ORAL | Status: DC
  Administered 2017-10-25 – 2017-10-26 (×2): 10 mg via ORAL
  Filled 2017-10-24 (×2): qty 1

## 2017-10-24 MED ORDER — HYDROXYZINE HCL 25 MG PO TABS
25.0000 mg | ORAL_TABLET | Freq: Three times a day (TID) | ORAL | Status: DC
Start: 2017-10-24 — End: 2017-10-26
  Administered 2017-10-24 – 2017-10-26 (×5): 25 mg via ORAL
  Filled 2017-10-24 (×7): qty 1

## 2017-10-24 MED ORDER — BUDESONIDE 0.25 MG/2ML IN SUSP
0.2500 mg | Freq: Two times a day (BID) | RESPIRATORY_TRACT | Status: DC
  Administered 2017-10-24 – 2017-10-26 (×4): 0.25 mg via RESPIRATORY_TRACT
  Filled 2017-10-24 (×4): qty 2

## 2017-10-24 MED ORDER — FLUTICASONE PROPIONATE 50 MCG/ACT NA SUSP
1.0000 | Freq: Every day | NASAL | Status: DC
  Administered 2017-10-25 – 2017-10-26 (×2): 1 via NASAL
  Filled 2017-10-24: qty 16

## 2017-10-24 MED ORDER — MELOXICAM 7.5 MG PO TABS
15.0000 mg | ORAL_TABLET | Freq: Every day | ORAL | Status: DC
  Administered 2017-10-25 – 2017-10-26 (×2): 15 mg via ORAL
  Filled 2017-10-24 (×3): qty 2

## 2017-10-24 MED ORDER — GABAPENTIN 100 MG PO CAPS
200.0000 mg | ORAL_CAPSULE | Freq: Two times a day (BID) | ORAL | Status: DC
  Administered 2017-10-24 – 2017-10-25 (×2): 200 mg via ORAL
  Filled 2017-10-24 (×2): qty 2

## 2017-10-24 MED ORDER — ALBUTEROL SULFATE (2.5 MG/3ML) 0.083% IN NEBU: 2.5000 mg | INHALATION_SOLUTION | RESPIRATORY_TRACT | Status: DC | PRN

## 2017-10-24 MED ORDER — BENAZEPRIL HCL 5 MG PO TABS
5.0000 mg | ORAL_TABLET | Freq: Every day | ORAL | Status: DC
  Administered 2017-10-25 – 2017-10-26 (×2): 5 mg via ORAL
  Filled 2017-10-24 (×3): qty 1

## 2017-10-24 MED ORDER — BUSPIRONE HCL 5 MG PO TABS
5.0000 mg | ORAL_TABLET | Freq: Three times a day (TID) | ORAL | Status: DC
Start: 2017-10-24 — End: 2017-10-25
  Administered 2017-10-24: 5 mg via ORAL
  Filled 2017-10-24: qty 0.5
  Filled 2017-10-24 (×2): qty 1
  Filled 2017-10-24: qty 0.5

## 2017-10-24 MED ORDER — LORATADINE 10 MG PO TABS
10.0000 mg | ORAL_TABLET | Freq: Every day | ORAL | Status: DC
  Administered 2017-10-25 – 2017-10-26 (×2): 10 mg via ORAL
  Filled 2017-10-24 (×2): qty 1

## 2017-10-24 MED ORDER — MAGNESIUM OXIDE 400 (241.3 MG) MG PO TABS
400.0000 mg | ORAL_TABLET | Freq: Two times a day (BID) | ORAL | Status: DC
  Administered 2017-10-24 – 2017-10-26 (×4): 400 mg via ORAL
  Filled 2017-10-24 (×4): qty 1

## 2017-10-24 MED ORDER — HEPARIN SODIUM (PORCINE) 5000 UNIT/ML IJ SOLN
5000.0000 [IU] | Freq: Three times a day (TID) | INTRAMUSCULAR | Status: DC
  Administered 2017-10-24 – 2017-10-26 (×5): 5000 [IU] via SUBCUTANEOUS
  Filled 2017-10-24 (×4): qty 1

## 2017-10-24 MED ORDER — TRAZODONE HCL 50 MG PO TABS
25.0000 mg | ORAL_TABLET | Freq: Every evening | ORAL | Status: DC | PRN
Start: 2017-10-24 — End: 2017-10-26
  Administered 2017-10-24: 50 mg via ORAL
  Filled 2017-10-24 (×2): qty 1

## 2017-10-24 NOTE — ED Provider Notes (Signed)
[facility] Emergency Department Provider Note   ____________________________________________    I have reviewed the triage vital signs and the nursing notes.   HISTORY  Chief Complaint Shortness of Breath     HPI Vicki Morales is a 77 y.o. female with history of COPD who presents with shortness of breath.  Patient reports over the last 2 days she has developed a cough and over the last 12 to 24 hours has developed worsening shortness of breath.  She denies chest pain.  No leg swelling or calf pain.  No recent travel.  She states this feels typical of her COPD.  Received 125 Solu-Medrol and DuoNeb via EMS which she states helped her feel better   Past Medical History:  Diagnosis Date  . Anxiety   . Arthritis   . Cataract    right eye  . COPD (chronic obstructive pulmonary disease) ([employer])   . Depression   . Dyspnea    DOE  . Dysrhythmia   . Edema    FEET/ANKLES  . GERD (gastroesophageal reflux disease)   . H/O wheezing   . History of hiatal hernia   . HOH (hard of hearing)   . Hypertension   . Incontinence of urine in female   . Orthopnea   . Pain    CHRONIC KNEE  . Pain    CHRONIC KNEE  . Paranoid disorder ([place])   . RLS (restless legs syndrome)   . Tremors of nervous system    HANDS    Patient Active Problem List   Diagnosis Date Noted  . Nocturnal hypoxia 08/21/2016  . Restless leg syndrome 08/20/2016  . Impingement syndrome of shoulder region 07/29/2016  . Hyponatremia 03/21/2016  . Pneumonia 03/18/2016  . History of suicide attempt 03/16/2016  . Overdose 03/16/2016  . Pulmonary hypertension ([place]) 11/11/2015  . Anxiety 09/22/2015  . Depression 09/22/2015  . Osteoarthritis 09/10/2015  . Rosacea 07/03/2015  . Breast pain 11/07/2014  . Callus of foot 11/07/2014  . CN (constipation) 11/07/2014  . Dermatitis, eczematoid 11/07/2014  . Diverticulosis of colon 11/07/2014  . Dizziness 11/07/2014  . Can't get food down  11/07/2014  . Accumulation of fluid in tissues 11/07/2014  . Fatigue 11/07/2014  . FOM (frequency of micturition) 11/07/2014  . Tension type headache 11/07/2014  . LBP (low back pain) 11/07/2014  . Episodic mood disorder ([place]) 11/07/2014  . Extreme obesity 11/07/2014  . Muscle ache 11/07/2014  . Disturbance of skin sensation 11/07/2014  . Awareness of heartbeats 11/07/2014  . Jerking 11/07/2014  . Body tinea 11/07/2014  . Essential (primary) hypertension 10/10/2014  . Moderate COPD (chronic obstructive pulmonary disease) ([place]) 04/01/2014  . Hx of obesity 04/01/2014  . CAFL (chronic airflow limitation) ([place]) 01/25/2009  . Malaise and fatigue 11/26/2008  . Aphasia 09/26/2008  . 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 GERD (gastroesophageal reflux disease) 06/07/1998  . HLD (hyperlipidemia) 06/07/1998  . OP (osteoporosis) 06/07/1998  . H/O total hysterectomy 03/08/1987  . Schizophrenia, in remission ([place]) 06/07/1978    Past Surgical History:  Procedure Laterality Date  . ABDOMINAL HYSTERECTOMY  1988  . APPENDECTOMY  1988  . CARDIAC CATHETERIZATION    . CATARACT EXTRACTION W/PHACO Right 07/14/2016   Procedure: CATARACT EXTRACTION PHACO AND INTRAOCULAR LENS PLACEMENT ([place]) anterior vitrectomy;  Surgeon: Estill Cotta, MD;  Location: [facility] ORS;  Service: Ophthalmology;  Laterality: Right;  [place] 02:40 AP% 24.4 CDE 59.98 Fluid pack # [id number]  . CATARACT EXTRACTION  W/PHACO Left 02/16/2017   Procedure: CATARACT EXTRACTION PHACO AND INTRAOCULAR LENS PLACEMENT (IOC);  Surgeon: Estill Cotta, MD;  Location: [facility] ORS;  Service: Ophthalmology;  Laterality: Left;  Lot # [id number] H [place]: 01:11.8 AP%: 23.5 CDE: 32.09   . ESOPHAGOGASTRODUODENOSCOPY N/A 11/11/2014   Procedure: ESOPHAGOGASTRODUODENOSCOPY (EGD);  Surgeon: Josefine Class, MD;  Location: [facility];  Service: Endoscopy;  Laterality: N/A;  . TONSILLECTOMY AND ADENOIDECTOMY    . TRIGGER FINGER RELEASE   2004    Prior to Admission medications   Medication Sig Start Date End Date Taking? Authorizing Provider  ADVAIR DISKUS 250-50 MCG/DOSE AEPB INHALE 1 PUFF INTO THE LUNGS EVERY 12 HOURS AS NEEDED Patient taking differently: INHALE 1 PUFF INTO THE LUNGS EVERY 12 HOURS 08/23/16   Birdie Sons, MD  albuterol (PROVENTIL) (2.5 MG/3ML) 0.083% nebulizer solution Take 3 mLs (2.5 mg total) by nebulization 2 (two) times daily as needed for wheezing or shortness of breath. 09/22/16   Birdie Sons, MD  aspirin EC 325 MG tablet Take 1 tablet (325 mg total) by mouth daily. Patient taking differently: Take 325 mg by mouth at bedtime.  09/23/15   Demetrios Loll, MD  benazepril (LOTENSIN) 5 MG tablet TAKE 1 TABLET(5 MG) BY MOUTH DAILY Patient taking differently: TAKE 1 TABLET(5 MG) BY MOUTH DAILY at bedtime 11/14/16   Birdie Sons, MD  busPIRone (BUSPAR) 5 MG tablet Take 1 tablet (5 mg total) by mouth 3 (three) times daily. 07/27/17   Ursula Alert, MD  Calcium Carbonate-Vitamin D (CALCIUM 600+D) 600-400 MG-UNIT tablet Take 1 tablet by mouth 2 (two) times daily.     [provider]  Cranberry 250 MG CAPS Take by mouth.    [provider]  CRANBERRY CONCENTRATE PO Take 1 capsule by mouth daily.    [provider]  fluticasone (FLONASE) 50 MCG/ACT nasal spray Place 1 spray into both nostrils daily.    [provider]  gabapentin (NEURONTIN) 100 MG capsule TAKE 1 CAPSULE(100 MG) BY MOUTH TWICE DAILY 04/26/17   Birdie Sons, MD  gabapentin (NEURONTIN) 300 MG capsule TAKE 2 CAPSULES(600 MG) BY MOUTH AT BEDTIME 04/26/17   Birdie Sons, MD  hydrOXYzine (ATARAX/VISTARIL) 25 MG tablet TAKE 1 TABLET(25 MG) BY MOUTH EVERY 8 HOURS AS NEEDED FOR ANXIETY 05/08/17   Birdie Sons, MD  loratadine (CLARITIN) 10 MG tablet Take 10 mg by mouth daily.    [provider]  magnesium oxide (MAG-OX) 400 MG tablet Take 400 mg by mouth 2 (two) times daily.    [provider]  Magnesium Oxide -Mg Supplement 250 MG TABS Take by mouth.    [provider]  meloxicam (MOBIC) 15 MG tablet TAKE 1 TABLET BY MOUTH EVERY DAY WITH A MEALS    [provider]  Misc Natural Products (OSTEO BI-FLEX ADV DOUBLE ST PO) Take 1 tablet by mouth 2 (two) times daily.     [provider]  montelukast (SINGULAIR) 10 MG tablet TAKE 1 TABLET BY MOUTH EVERY DAY 04/22/16   Birdie Sons, MD  Multiple Vitamin (MULTIVITAMIN WITH MINERALS) TABS tablet Take 1 tablet by mouth daily.    [provider]  Multiple Vitamins-Minerals (THERA-M) TABS Take by mouth.    [provider]  Omega-3 Fatty Acids (FISH OIL) 1000 MG CAPS Take 1,000 mg by mouth daily.     [provider]  omeprazole (PRILOSEC) 20 MG capsule Take 20 mg by mouth daily before breakfast.  [provider]  oxybutynin (DITROPAN-XL) 10 MG 24 hr tablet TAKE 1 TABLET BY MOUTH AT BEDTIME 08/21/17   Birdie Sons, MD  Probiotic Product (ALIGN) 4 MG CAPS Take 4 mg by mouth daily.     [provider]  QUEtiapine (SEROQUEL) 50 MG tablet Take 1.5 tablets (75 mg total) by mouth at bedtime. Patient taking differently: Take 75 mg by mouth at bedtime.  09/05/17   Ursula Alert, MD  ranitidine (ZANTAC) 150 MG tablet Take 150 mg by mouth at bedtime.    [provider]  sertraline (ZOLOFT) 50 MG tablet Take 1 tablet (50 mg total) by mouth at bedtime. 09/05/17   Ursula Alert, MD  theophylline (UNIPHYL) 400 MG 24 hr tablet TAKE 1 TABLET BY MOUTH DAILY. 08/22/16   Birdie Sons, MD  tiZANidine (ZANAFLEX) 4 MG tablet Take 1 tablet (4 mg total) by mouth every 8 (eight) hours as needed for muscle spasms. 06/17/17   Birdie Sons, MD  traMADol (ULTRAM) 50 MG tablet Take 2 tablets (100 mg total) by mouth 2 (two) times daily. 03/14/17   Birdie Sons, MD  traZODone (DESYREL) 50 MG tablet Take 0.5-1 tablets (25-50 mg total) by mouth at bedtime as needed for sleep. 09/05/17    Ursula Alert, MD     Allergies Arthrotec [diclofenac-misoprostol]; Codeine; Diclofenac; Hydrochlorothiazide; Penicillins; Sulfa antibiotics; Tiotropium bromide monohydrate; Tylenol [acetaminophen]; Morphine; and Pantoprazole  Family History  Problem Relation Age of Onset  . Breast cancer Sister   . Stroke Father   . Emphysema Father   . Anxiety disorder Father   . Depression Father   . Anxiety disorder Brother     Social History Social History   Tobacco Use  . Smoking status: Former Smoker    Years: 30.00  . Smokeless tobacco: Never Used  . Tobacco comment: quit in 1989  Substance Use Topics  . Alcohol use: No    Alcohol/week: 0.0 oz  . Drug use: No    Review of Systems  Constitutional: No fever/chills Eyes: No visual changes.  ENT: No sore throat. Cardiovascular: Denies chest pain. Respiratory: As above Gastrointestinal: No abdominal pain.  No nausea, no vomiting.   Genitourinary: Negative for dysuria. Musculoskeletal: Negative for back pain. Skin: Negative for rash. Neurological: Negative for headaches    ____________________________________________   PHYSICAL EXAM:  VITAL SIGNS: ED Triage Vitals  Enc Vitals Group     BP 10/24/17 1328 (!) 135/57     Pulse Rate 10/24/17 1328 81     Resp 10/24/17 1328 16     Temp 10/24/17 1328 98.5 F (36.9 C)     Temp Source 10/24/17 1328 Oral     SpO2 10/24/17 1328 92 %     Weight 10/24/17 1330 106.1 kg (234 lb)     Height 10/24/17 1330 1.524 m (5')     Head Circumference --      Peak Flow --      Pain Score 10/24/17 1329 5     Pain Loc --      Pain Edu? --      Excl. in [place]? --     Constitutional: Alert and oriented.  Eyes: Conjunctivae are normal.   Nose: No congestion/rhinnorhea.  Cardiovascular: Normal rate, regular rhythm. Grossly normal heart sounds.  Good peripheral circulation. Respiratory: Increased respiratory effort with mild tachypnea, diffuse wheezing moderate airflow Gastrointestinal:  Soft and nontender. No distention.    Musculoskeletal: No lower extremity tenderness nor edema.  Warm and  well perfused Neurologic:  Normal speech and language. No gross focal neurologic deficits are appreciated.  Skin:  Skin is warm, dry and intact. No rash noted. Psychiatric: Mood and affect are normal. Speech and behavior are normal.  ____________________________________________   LABS (all labs ordered are listed, but only abnormal results are displayed)  Labs Reviewed  COMPREHENSIVE METABOLIC PANEL - Abnormal; Notable for the following components:      Result Value   BUN 21 (*)    Calcium 8.5 (*)    Total Protein 5.4 (*)    ALT 13 (*)    All other components within normal limits  CBC - Abnormal; Notable for the following components:   RDW 14.9 (*)    All other components within normal limits  TROPONIN I   ____________________________________________  EKG  None ____________________________________________  RADIOLOGY  Chest x-ray unremarkable ____________________________________________   PROCEDURES  Procedure(s) performed: No  Procedures   Critical Care performed: No ____________________________________________   INITIAL IMPRESSION / ASSESSMENT AND PLAN / ED COURSE  Pertinent labs & imaging results that were available during my care of the patient were reviewed by me and considered in my medical decision making (see chart for details).  Patient presents with shortness of breath, wheezing on exam consistent with COPD exacerbation.  Will treat with additional nebulizers, check x-ray, labs and reevaluate  ----------------------------------------- 4:06 PM on 10/24/2017 -----------------------------------------  Chest x-ray unremarkable, labs reassuring.  However patient is unable to ambulate without dropping below 90%, nasal cannula started discussed with hospitalist for admission    ____________________________________________   FINAL CLINICAL  IMPRESSION(S) / ED DIAGNOSES  Final diagnoses:  COPD exacerbation ([place])        Note:  This document was prepared using Dragon voice recognition software and may include unintentional dictation errors.    Lavonia Drafts, MD 10/24/17 [phone]

## 2017-10-24 NOTE — ED Notes (Signed)
No nurses available to receive report. Will call back.

## 2017-10-24 NOTE — Progress Notes (Signed)
Family Meeting Note  Advance Directive:yes  Today a meeting took place with the Patient and daughter.  The following clinical team members were present during this meeting:MD  The following were discussed:Patient's diagnosis: COPD, Patient's progosis: Unable to determine and Goals for treatment: DNR  Additional follow-up to be provided: PMD  Time spent during discussion:20 minutes  Vaughan Basta, MD

## 2017-10-24 NOTE — ED Triage Notes (Signed)
Pt arrives via ems from home with a report of SOB. Ems states pt has had a cough for a couple days that is progressively getting worse with increasing SOB. On arrival pt has tachypnea, dyspnea at rest, and reports increase in weakness with SOB. Ems reports administering duoneb, and 125 solumedrol in route. Pt alert and able to answer questions in full sentences

## 2017-10-24 NOTE — ED Notes (Signed)
Pt called out c/o d/t feelings of increasing SHOB; pt noted to be at 88-91% O2 sats on RA. Pt stated she has COPD but does not require to use of supplemental O2. Pt placed on 2L O2 via [place] at this time with improvement to 96%.

## 2017-10-24 NOTE — H&P (Signed)
[facility] [place] NAME: Vicki Morales    MR#:  [id number]  DATE OF BIRTH:  1941-04-30  DATE OF ADMISSION:  10/24/2017  PRIMARY CARE PHYSICIAN: Birdie Sons, MD   REQUESTING/REFERRING PHYSICIAN: kinner  CHIEF COMPLAINT:   Chief Complaint  Patient presents with  . Shortness of Breath    HISTORY OF PRESENT ILLNESS: Vicki Morales  is a 77 y.o. female with a known history of Cataract, COPD, depression, gastroesophageal reflux disease, hypertension, orthopnea- started having shortness of breath and cough for last 3-4 days which is progressively getting worse and she is not able to walk much without getting short of breath now. She denies any associated fever or chills and denies any sputum production. Came to emergency room and noted to be hypoxic started on oxygen supplementation and did not respond much to nebulizer therapy so given to hospitalist team for admission for COPD exacerbation with hypoxia.  PAST MEDICAL HISTORY:   Past Medical History:  Diagnosis Date  . Anxiety   . Arthritis   . Cataract    right eye  . COPD (chronic obstructive pulmonary disease) ([place])   . Depression   . Dyspnea    DOE  . Dysrhythmia   . Edema    FEET/ANKLES  . GERD (gastroesophageal reflux disease)   . H/O wheezing   . History of hiatal hernia   . HOH (hard of hearing)   . Hypertension   . Incontinence of urine in female   . Orthopnea   . Pain    CHRONIC KNEE  . Pain    CHRONIC KNEE  . Paranoid disorder ([place])   . RLS (restless legs syndrome)   . Tremors of nervous system    HANDS    PAST SURGICAL HISTORY:  Past Surgical History:  Procedure Laterality Date  . ABDOMINAL HYSTERECTOMY  1988  . APPENDECTOMY  1988  . CARDIAC CATHETERIZATION    . CATARACT EXTRACTION W/PHACO Right 07/14/2016   Procedure: CATARACT EXTRACTION PHACO AND INTRAOCULAR LENS PLACEMENT ([place]) anterior vitrectomy;  Surgeon: Estill Cotta, MD;  Location: [facility] ORS;   Service: Ophthalmology;  Laterality: Right;  [place] 02:40 AP% 24.4 CDE 59.98 Fluid pack # [id number]  . CATARACT EXTRACTION W/PHACO Left 02/16/2017   Procedure: CATARACT EXTRACTION PHACO AND INTRAOCULAR LENS PLACEMENT (IOC);  Surgeon: Estill Cotta, MD;  Location: [facility] ORS;  Service: Ophthalmology;  Laterality: Left;  Lot # [id number] H [place]: 01:11.8 AP%: 23.5 CDE: 32.09   . ESOPHAGOGASTRODUODENOSCOPY N/A 11/11/2014   Procedure: ESOPHAGOGASTRODUODENOSCOPY (EGD);  Surgeon: Josefine Class, MD;  Location: [facility] ENDOSCOPY;  Service: Endoscopy;  Laterality: N/A;  . TONSILLECTOMY AND ADENOIDECTOMY    . TRIGGER FINGER RELEASE  2004    SOCIAL HISTORY:  Social History   Tobacco Use  . Smoking status: Former Smoker    Years: 30.00  . Smokeless tobacco: Never Used  . Tobacco comment: quit in 1989  Substance Use Topics  . Alcohol use: No    Alcohol/week: 0.0 oz    FAMILY HISTORY:  Family History  Problem Relation Age of Onset  . Breast cancer Sister   . Stroke Father   . Emphysema Father   . Anxiety disorder Father   . Depression Father   . Anxiety disorder Brother     DRUG ALLERGIES:  Allergies  Allergen Reactions  . Arthrotec [Diclofenac-Misoprostol] Hives  . Codeine Other (See Comments)    Headache   . Diclofenac Other (See Comments) and Hives  Pt unsure allergy  . Hydrochlorothiazide Other (See Comments)    Weakness   . Penicillins Hives    Has patient had a PCN reaction causing immediate rash, facial/tongue/throat swelling, SOB or lightheadedness with hypotension: No Has patient had a PCN reaction causing severe rash involving mucus membranes or skin necrosis: No Has patient had a PCN reaction that required hospitalization: No Has patient had a PCN reaction occurring within the last 10 years: No If all of the above answers are "NO", then may proceed with Cephalosporin use.  . Sulfa Antibiotics Hives  . Tiotropium Bromide Monohydrate Other (See Comments)    Blurry vision    . Tylenol [Acetaminophen] Other (See Comments)    Doesn't work  . Morphine Hives and Rash  . Pantoprazole Rash    REVIEW OF SYSTEMS:   CONSTITUTIONAL: No fever, fatigue or weakness.  EYES: No blurred or double vision.  EARS, NOSE, AND THROAT: No tinnitus or ear pain.  RESPIRATORY: she have cough, shortness of breath, wheezing , no hemoptysis.  CARDIOVASCULAR: No chest pain, orthopnea, edema.  GASTROINTESTINAL: No nausea, vomiting, diarrhea or abdominal pain.  GENITOURINARY: No dysuria, hematuria.  ENDOCRINE: No polyuria, nocturia,  HEMATOLOGY: No anemia, easy bruising or bleeding SKIN: No rash or lesion. MUSCULOSKELETAL: No joint pain or arthritis.   NEUROLOGIC: No tingling, numbness, weakness.  PSYCHIATRY: No anxiety or depression.   MEDICATIONS AT HOME:  Prior to Admission medications   Medication Sig Start Date End Date Taking? Authorizing Provider  ADVAIR DISKUS 250-50 MCG/DOSE AEPB INHALE 1 PUFF INTO THE LUNGS EVERY 12 HOURS AS NEEDED Patient taking differently: INHALE 1 PUFF INTO THE LUNGS EVERY 12 HOURS 08/23/16  Yes Birdie Sons, MD  albuterol (PROVENTIL) (2.5 MG/3ML) 0.083% nebulizer solution Take 3 mLs (2.5 mg total) by nebulization 2 (two) times daily as needed for wheezing or shortness of breath. 09/22/16  Yes Birdie Sons, MD  aspirin EC 325 MG tablet Take 1 tablet (325 mg total) by mouth daily. Patient taking differently: Take 325 mg by mouth at bedtime.  09/23/15  Yes Demetrios Loll, MD  benazepril (LOTENSIN) 5 MG tablet TAKE 1 TABLET(5 MG) BY MOUTH DAILY Patient taking differently: TAKE 1 TABLET(5 MG) BY MOUTH DAILY at bedtime 11/14/16  Yes Birdie Sons, MD  busPIRone (BUSPAR) 5 MG tablet Take 1 tablet (5 mg total) by mouth 3 (three) times daily. 07/27/17  Yes Ursula Alert, MD  Calcium Carbonate-Vitamin D (CALCIUM 600+D) 600-400 MG-UNIT tablet Take 1 tablet by mouth 2 (two) times daily.    Yes [provider]  Cranberry 250 MG CAPS Take 1 capsule by  mouth daily.    Yes [provider]  fluticasone (FLONASE) 50 MCG/ACT nasal spray Place 1 spray into both nostrils daily.   Yes [provider]  gabapentin (NEURONTIN) 100 MG capsule TAKE 1 CAPSULE(100 MG) BY MOUTH TWICE DAILY 04/26/17  Yes Birdie Sons, MD  gabapentin (NEURONTIN) 300 MG capsule TAKE 2 CAPSULES(600 MG) BY MOUTH AT BEDTIME 04/26/17  Yes Birdie Sons, MD  hydrOXYzine (ATARAX/VISTARIL) 25 MG tablet TAKE 1 TABLET(25 MG) BY MOUTH EVERY 8 HOURS AS NEEDED FOR ANXIETY 05/08/17  Yes Birdie Sons, MD  loratadine (CLARITIN) 10 MG tablet Take 10 mg by mouth daily.   Yes [provider]  magnesium oxide (MAG-OX) 400 MG tablet Take 400 mg by mouth 2 (two) times daily.   Yes [provider]  meloxicam (MOBIC) 15 MG tablet TAKE 1 TABLET BY MOUTH EVERY DAY WITH  A MEALS   Yes [provider]  Misc Natural Products (OSTEO BI-FLEX ADV DOUBLE ST PO) Take 1 tablet by mouth 2 (two) times daily.    Yes [provider]  montelukast (SINGULAIR) 10 MG tablet TAKE 1 TABLET BY MOUTH EVERY DAY 04/22/16  Yes Birdie Sons, MD  Multiple Vitamin (MULTIVITAMIN WITH MINERALS) TABS tablet Take 1 tablet by mouth daily.   Yes [provider]  Omega-3 Fatty Acids (FISH OIL) 1000 MG CAPS Take 1,000 mg by mouth daily.    Yes [provider]  omeprazole (PRILOSEC) 20 MG capsule Take 20 mg by mouth daily before breakfast.    Yes [provider]  oxybutynin (DITROPAN-XL) 10 MG 24 hr tablet TAKE 1 TABLET BY MOUTH AT BEDTIME 08/21/17  Yes Birdie Sons, MD  Probiotic Product (ALIGN) 4 MG CAPS Take 4 mg by mouth daily.    Yes [provider]  QUEtiapine (SEROQUEL) 50 MG tablet Take 1.5 tablets (75 mg total) by mouth at bedtime. Patient taking differently: Take 75 mg by mouth at bedtime.  09/05/17  Yes Ursula Alert, MD  ranitidine (ZANTAC) 150 MG tablet Take 150 mg by mouth at bedtime.   Yes [provider]   sertraline (ZOLOFT) 50 MG tablet Take 1 tablet (50 mg total) by mouth at bedtime. 09/05/17  Yes Eappen, Ria Clock, MD  theophylline (UNIPHYL) 400 MG 24 hr tablet TAKE 1 TABLET BY MOUTH DAILY. 08/22/16  Yes Birdie Sons, MD  traMADol (ULTRAM) 50 MG tablet Take 2 tablets (100 mg total) by mouth 2 (two) times daily. 03/14/17  Yes Birdie Sons, MD  tiZANidine (ZANAFLEX) 4 MG tablet Take 1 tablet (4 mg total) by mouth every 8 (eight) hours as needed for muscle spasms. 06/17/17   Birdie Sons, MD  traZODone (DESYREL) 50 MG tablet Take 0.5-1 tablets (25-50 mg total) by mouth at bedtime as needed for sleep. 09/05/17   Ursula Alert, MD      PHYSICAL EXAMINATION:   VITAL SIGNS: Blood pressure (!) 135/57, pulse 81, temperature 98.5 F (36.9 C), temperature source Oral, resp. rate 16, height 5' (1.524 m), weight 106.1 kg (234 lb), SpO2 92 %.  GENERAL:  77 y.o.-year-old patient lying in the bed with no acute distress.  EYES: Pupils equal, round, reactive to light and accommodation. No scleral icterus. Extraocular muscles intact.  HEENT: Head atraumatic, normocephalic. Oropharynx and nasopharynx clear.  NECK:  Supple, no jugular venous distention. No thyroid enlargement, no tenderness.  LUNGS: Normal breath sounds bilaterally, some wheezing, no crepitation. No use of accessory muscles of respiration.  CARDIOVASCULAR: S1, S2 normal. No murmurs, rubs, or gallops.  ABDOMEN: Soft, nontender, nondistended. Bowel sounds present. No organomegaly or mass.  EXTREMITIES: No pedal edema, cyanosis, or clubbing.  NEUROLOGIC: Cranial nerves II through XII are intact. Muscle strength 5/5 in all extremities. Sensation intact. Gait not checked.  PSYCHIATRIC: The patient is alert and oriented x 3.  SKIN: No obvious rash, lesion, or ulcer.   LABORATORY PANEL:   CBC Recent Labs  Lab 10/24/17 1404  WBC 5.6  HGB 12.9  HCT 38.1  PLT 169  MCV 93.3  MCH 31.6  MCHC 33.8  RDW 14.9*    ------------------------------------------------------------------------------------------------------------------  Chemistries  Recent Labs  Lab 10/24/17 1346  NA 140  K 4.0  CL 105  CO2 25  GLUCOSE 91  BUN 21*  CREATININE 0.57  CALCIUM 8.5*  AST 17  ALT 13*  ALKPHOS 77  BILITOT 0.7   ------------------------------------------------------------------------------------------------------------------  estimated creatinine clearance is 63.8 mL/min (by C-G formula based on SCr of 0.57 mg/dL). ------------------------------------------------------------------------------------------------------------------ No results for input(s): TSH, T4TOTAL, T3FREE, THYROIDAB in the last 72 hours.  Invalid input(s): FREET3   Coagulation profile No results for input(s): INR, PROTIME in the last 168 hours. ------------------------------------------------------------------------------------------------------------------- No results for input(s): DDIMER in the last 72 hours. -------------------------------------------------------------------------------------------------------------------  Cardiac Enzymes Recent Labs  Lab 10/24/17 1346  TROPONINI <0.03   ------------------------------------------------------------------------------------------------------------------ Invalid input(s): POCBNP  ---------------------------------------------------------------------------------------------------------------  Urinalysis    Component Value Date/Time   COLORURINE YELLOW (A) 03/16/2016 1556   APPEARANCEUR HAZY (A) 03/16/2016 1556   APPEARANCEUR Clear 09/25/2014 1019   LABSPEC 1.024 03/16/2016 1556   LABSPEC 1.004 09/25/2014 1019   PHURINE 5.0 03/16/2016 1556   GLUCOSEU NEGATIVE 03/16/2016 1556   GLUCOSEU Negative 09/25/2014 1019   HGBUR NEGATIVE 03/16/2016 1556   BILIRUBINUR negative 06/17/2017 1535   BILIRUBINUR Negative 09/25/2014 1019   KETONESUR TRACE (A) 03/16/2016 1556   PROTEINUR  negative 06/17/2017 1535   PROTEINUR NEGATIVE 03/16/2016 1556   UROBILINOGEN 0.2 06/17/2017 1535   NITRITE negative 06/17/2017 1535   NITRITE NEGATIVE 03/16/2016 1556   LEUKOCYTESUR Negative 06/17/2017 1535   LEUKOCYTESUR 2+ 09/25/2014 1019     RADIOLOGY: Dg Chest Port 1 View  Result Date: 10/24/2017 CLINICAL DATA:  Onset of shortness of breath over the past 2-3 days. EXAM: PORTABLE CHEST 1 VIEW COMPARISON:  Single-view of the chest 03/16/2016. PA and lateral chest 09/22/2015. CT chest 12/24/2015. FINDINGS: The lungs are clear. Heart size is upper normal. Aortic atherosclerosis is noted. No pneumothorax or pleural effusion. No acute bony abnormality. IMPRESSION: No acute disease. Atherosclerosis. Electronically Signed   By: Inge Rise M.D.   On: 10/24/2017 13:43    EKG: Orders placed or performed during the hospital encounter of 10/24/17  . ED EKG  . ED EKG    IMPRESSION AND PLAN:  * acute hypoxic respiratory failure   Due to COPD exacerbation    Continue supplemental oxygen.   Continue IV and inhale steroid and nebulizer therapy.   Continue theophylline.   Chest x-ray looks clear, we will give Levaquin for bronchitis.  * gastroesophageal reflux disease    Continue PPI.  * depression   Continue sertraline.  * Peripheral neuropathy   Continue gabapentin.  All the records are reviewed and case discussed with ED provider. Management plans discussed with the patient, family and they are in agreement.  CODE STATUS:DO NOT RESUSCITATE Code Status History    Date Active Date Inactive Code Status Order ID Comments User Context   09/23/2015 0018 09/23/2015 1823 DNR [id number]  Lance Coon, MD Inpatient    Questions for Most Recent Historical Code Status (Order [id number])    Question Answer Comment   In the event of cardiac or respiratory ARREST Do not call a "code blue"    In the event of cardiac or respiratory ARREST Do not perform Intubation, CPR, defibrillation or  ACLS    In the event of cardiac or respiratory ARREST Use medication by any route, position, wound care, and other measures to relive pain and suffering. May use oxygen, suction and manual treatment of airway obstruction as needed for comfort.      Patient's daughter is in the room during my visit.  TOTAL TIME TAKING CARE OF THIS PATIENT: 50 minutes.    Vaughan Basta M.D on 10/24/2017   Between 7am to 6pm - Pager - [phone]  After 6pm go to [email or web address] - [place]  [facility]  Office  [phone]  CC: Primary care physician; Birdie Sons, MD   Note: This dictation was prepared with Dragon dictation along with smaller phrase technology. Any transcriptional errors that result from this process are unintentional.

## 2017-10-24 NOTE — ED Notes (Signed)
Admitting MD in with pt    

## 2017-10-25 ENCOUNTER — Telehealth: Payer: Self-pay

## 2017-10-25 ENCOUNTER — Telehealth: Payer: Self-pay | Admitting: Family Medicine

## 2017-10-25 DIAGNOSIS — F200 Paranoid schizophrenia: Secondary | ICD-10-CM

## 2017-10-25 DIAGNOSIS — F410 Panic disorder [episodic paroxysmal anxiety] without agoraphobia: Secondary | ICD-10-CM

## 2017-10-25 DIAGNOSIS — B351 Tinea unguium: Secondary | ICD-10-CM

## 2017-10-25 LAB — CBC
HCT: 41.5 % (ref 35.0–47.0)
HEMOGLOBIN: 13.9 g/dL (ref 12.0–16.0)
MCH: 31.4 pg (ref 26.0–34.0)
MCHC: 33.5 g/dL (ref 32.0–36.0)
MCV: 93.7 fL (ref 80.0–100.0)
PLATELETS: 170 10*3/uL (ref 150–440)
RBC: 4.43 MIL/uL (ref 3.80–5.20)
RDW: 15 % — ABNORMAL HIGH (ref 11.5–14.5)
WBC: 5.9 10*3/uL (ref 3.6–11.0)

## 2017-10-25 LAB — BASIC METABOLIC PANEL
ANION GAP: 7 (ref 5–15)
BUN: 22 mg/dL — ABNORMAL HIGH (ref 6–20)
CALCIUM: 9.3 mg/dL (ref 8.9–10.3)
CO2: 28 mmol/L (ref 22–32)
Chloride: 103 mmol/L (ref 101–111)
Creatinine, Ser: 0.81 mg/dL (ref 0.44–1.00)
GFR calc Af Amer: >60 mL/min (ref >60)
GFR calc non Af Amer: >60 mL/min (ref >60)
GLUCOSE: 171 mg/dL — AB (ref 65–99)
Potassium: 4.7 mmol/L (ref 3.5–5.1)
Sodium: 138 mmol/L (ref 135–145)

## 2017-10-25 MED ORDER — IPRATROPIUM-ALBUTEROL 0.5-2.5 (3) MG/3ML IN SOLN
3.0000 mL | Freq: Four times a day (QID) | RESPIRATORY_TRACT | Status: DC
  Administered 2017-10-25 – 2017-10-26 (×4): 3 mL via RESPIRATORY_TRACT
  Filled 2017-10-25 (×4): qty 3

## 2017-10-25 MED ORDER — IBUPROFEN 400 MG PO TABS: 600.0000 mg | ORAL_TABLET | Freq: Four times a day (QID) | ORAL | Status: DC | PRN

## 2017-10-25 MED ORDER — GABAPENTIN 300 MG PO CAPS
600.0000 mg | ORAL_CAPSULE | Freq: Every day | ORAL | Status: DC
  Administered 2017-10-25: 600 mg via ORAL
  Filled 2017-10-25: qty 2

## 2017-10-25 MED ORDER — METHYLPREDNISOLONE SODIUM SUCC 125 MG IJ SOLR
60.0000 mg | Freq: Two times a day (BID) | INTRAMUSCULAR | Status: DC
  Administered 2017-10-25 – 2017-10-26 (×2): 60 mg via INTRAVENOUS
  Filled 2017-10-25 (×2): qty 2

## 2017-10-25 MED ORDER — BUTALBITAL-APAP-CAFFEINE 50-325-40 MG PO TABS
2.0000 | ORAL_TABLET | Freq: Four times a day (QID) | ORAL | Status: DC | PRN
  Administered 2017-10-25 (×2): 2 via ORAL
  Filled 2017-10-25 (×2): qty 2

## 2017-10-25 MED ORDER — QUETIAPINE FUMARATE 50 MG PO TABS: 100.0000 mg | ORAL_TABLET | Freq: Every day | ORAL | 1 refills | Status: DC

## 2017-10-25 MED ORDER — GABAPENTIN 100 MG PO CAPS
100.0000 mg | ORAL_CAPSULE | Freq: Two times a day (BID) | ORAL | Status: DC
  Administered 2017-10-25 – 2017-10-26 (×2): 100 mg via ORAL
  Filled 2017-10-25 (×2): qty 1

## 2017-10-25 MED ORDER — ALUM & MAG HYDROXIDE-SIMETH 200-200-20 MG/5ML PO SUSP
30.0000 mL | ORAL | Status: DC | PRN
  Administered 2017-10-25: 30 mL via ORAL
  Filled 2017-10-25: qty 30

## 2017-10-25 MED ORDER — ACETAMINOPHEN 500 MG PO TABS
1000.0000 mg | ORAL_TABLET | Freq: Four times a day (QID) | ORAL | Status: DC | PRN
  Administered 2017-10-25: 1000 mg via ORAL
  Filled 2017-10-25: qty 2

## 2017-10-25 MED ORDER — BUSPIRONE HCL 5 MG PO TABS
5.0000 mg | ORAL_TABLET | Freq: Three times a day (TID) | ORAL | Status: DC
  Administered 2017-10-25 – 2017-10-26 (×3): 5 mg via ORAL
  Filled 2017-10-25 (×5): qty 1

## 2017-10-25 MED ORDER — PREMIER PROTEIN SHAKE
11.0000 [oz_av] | Freq: Two times a day (BID) | ORAL | Status: DC
  Administered 2017-10-25: 11 [oz_av] via ORAL

## 2017-10-25 NOTE — Telephone Encounter (Signed)
Please advise 

## 2017-10-25 NOTE — Telephone Encounter (Signed)
pt daughter called left message that you had wanted to increase a medication called quetiapine but that at the time she thought her mother would be ok but now she wants to try the increase.

## 2017-10-25 NOTE — Progress Notes (Signed)
Initial Nutrition Assessment  DOCUMENTATION CODES:   Morbid obesity  INTERVENTION:   Premier Protein BID, each supplement provides 160 kcal and 30 grams of protein.   Liberalize diet  NUTRITION DIAGNOSIS:   Increased nutrient needs related to chronic illness(COPD) as evidenced by increased estimated needs.  GOAL:   Patient will meet greater than or equal to 90% of their needs  MONITOR:   PO intake, Supplement acceptance, Weight trends, Labs, Skin, I & O's  REASON FOR ASSESSMENT:   Malnutrition Screening Tool    ASSESSMENT:    77 y.o. female with a known history of Cataract, COPD, depression, gastroesophageal reflux disease, hypertension, orthopnea- started having shortness of breath and cough for last 3-4 days which is progressively getting worse    Met with pt in room today. Pt reports good appetite and oral intake pta. Pt is currently eating 100% of meals in hospital. Pt reports severe reflux and migraine headache today. Per chart, pt is weight stable pta. RD will order supplements to help pt meet her estimated protein needs.   Medications reviewed and include: aspirin, oscal with D, heparin, Mg oxide, meloxicam, solu-medrol, protonix  Labs reviewed:   NUTRITION - FOCUSED PHYSICAL EXAM:    Most Recent Value  Orbital Region  No depletion  Upper Arm Region  No depletion  Thoracic and Lumbar Region  No depletion  Buccal Region  No depletion  Temple Region  No depletion  Clavicle Bone Region  No depletion  Clavicle and Acromion Bone Region  No depletion  Scapular Bone Region  No depletion  Dorsal Hand  No depletion  Patellar Region  No depletion  Anterior Thigh Region  No depletion  Posterior Calf Region  No depletion  Edema (RD Assessment)  None  Hair  Reviewed  Eyes  Reviewed  Mouth  Reviewed  Skin  Reviewed  Nails  Reviewed     Diet Order:   Diet Order           Diet regular Room service appropriate? Yes; Fluid consistency: Thin  Diet effective now          EDUCATION NEEDS:   Education needs have been addressed  Skin:  Skin Assessment: Reviewed RN Assessment  Last BM:  5/21- type 6   Height:   Ht Readings from Last 1 Encounters:  10/24/17 5' (1.524 m)    Weight:   Wt Readings from Last 1 Encounters:  10/24/17 234 lb (106.1 kg)    Ideal Body Weight:  45.4 kg  BMI:  Body mass index is 45.7 kg/m.  Estimated Nutritional Needs:   Kcal:  1700-1900kcal/day   Protein:  106-127g/day   Fluid:  >1.4L/day   Koleen Distance MS, RD, LDN Pager #- [phone] Office#- [phone] After Hours Pager: [phone]

## 2017-10-25 NOTE — Telephone Encounter (Signed)
Will increase seroquel to 100 mg po qhs . Sent script to pharmacy.

## 2017-10-25 NOTE — Progress Notes (Signed)
[facility] [place] NAME: Vicki Morales    MR#:  [id number]  DATE OF BIRTH:  Jul 25, 1940  SUBJECTIVE:   Patient here due to shortness of breath and noted to be in COPD exacerbation.  Shortness of breath has improved since yesterday.  Patient now complaining of a migraine headache.  REVIEW OF SYSTEMS:    Review of Systems  Constitutional: Negative for chills and fever.  HENT: Negative for congestion and tinnitus.   Eyes: Negative for blurred vision and double vision.  Respiratory: Positive for cough, shortness of breath and wheezing.   Cardiovascular: Negative for chest pain, orthopnea and PND.  Gastrointestinal: Negative for abdominal pain, diarrhea, nausea and vomiting.  Genitourinary: Negative for dysuria and hematuria.  Neurological: Positive for headaches. Negative for dizziness, sensory change and focal weakness.  All other systems reviewed and are negative.   Nutrition: Regular Tolerating Diet: Yes Tolerating PT: Await Eval.   DRUG ALLERGIES:   Allergies  Allergen Reactions  . Arthrotec [Diclofenac-Misoprostol] Hives  . Codeine Other (See Comments)    Headache   . Diclofenac Other (See Comments) and Hives    Pt unsure allergy  . Hydrochlorothiazide Other (See Comments)    Weakness   . Penicillins Hives    Has patient had a PCN reaction causing immediate rash, facial/tongue/throat swelling, SOB or lightheadedness with hypotension: No Has patient had a PCN reaction causing severe rash involving mucus membranes or skin necrosis: No Has patient had a PCN reaction that required hospitalization: No Has patient had a PCN reaction occurring within the last 10 years: No If all of the above answers are "NO", then may proceed with Cephalosporin use.  . Sulfa Antibiotics Hives  . Tiotropium Bromide Monohydrate Other (See Comments)    Blurry vision   . Tylenol [Acetaminophen] Other (See Comments)    Doesn't work  . Morphine Hives and  Rash  . Pantoprazole Rash    VITALS:  Blood pressure (!) 171/81, pulse 86, temperature 97.6 F (36.4 C), resp. rate 18, height 5' (1.524 m), weight 106.1 kg (234 lb), SpO2 95 %.  PHYSICAL EXAMINATION:   Physical Exam  GENERAL:  77 y.o.-year-old patient lying in bed in no acute distress.  EYES: Pupils equal, round, reactive to light and accommodation. No scleral icterus. Extraocular muscles intact.  HEENT: Head atraumatic, normocephalic. Oropharynx and nasopharynx clear.  NECK:  Supple, no jugular venous distention. No thyroid enlargement, no tenderness.  LUNGS: Good A/E b/l, diffuse end expiratory wheezing bilaterally, no rales, rhonchi.  Negative use of accessory muscles. CARDIOVASCULAR: S1, S2 normal. No murmurs, rubs, or gallops.  ABDOMEN: Soft, nontender, nondistended. Bowel sounds present. No organomegaly or mass.  EXTREMITIES: No cyanosis, clubbing or edema b/l.    NEUROLOGIC: Cranial nerves II through XII are intact. No focal Motor or sensory deficits b/l.   PSYCHIATRIC: The patient is alert and oriented x 3.  SKIN: No obvious rash, lesion, or ulcer.    LABORATORY PANEL:   CBC Recent Labs  Lab 10/25/17 0410  WBC 5.9  HGB 13.9  HCT 41.5  PLT 170   ------------------------------------------------------------------------------------------------------------------  Chemistries  Recent Labs  Lab 10/24/17 1346 10/25/17 0410  NA 140 138  K 4.0 4.7  CL 105 103  CO2 25 28  GLUCOSE 91 171*  BUN 21* 22*  CREATININE 0.57 0.81  CALCIUM 8.5* 9.3  AST 17  --   ALT 13*  --   ALKPHOS 77  --   BILITOT 0.7  --    ------------------------------------------------------------------------------------------------------------------  Cardiac Enzymes Recent Labs  Lab 10/24/17 1346  TROPONINI <0.03   ------------------------------------------------------------------------------------------------------------------  RADIOLOGY:  Dg Chest Port 1 View  Result Date:  10/24/2017 CLINICAL DATA:  Onset of shortness of breath over the past 2-3 days. EXAM: PORTABLE CHEST 1 VIEW COMPARISON:  Single-view of the chest 03/16/2016. PA and lateral chest 09/22/2015. CT chest 12/24/2015. FINDINGS: The lungs are clear. Heart size is upper normal. Aortic atherosclerosis is noted. No pneumothorax or pleural effusion. No acute bony abnormality. IMPRESSION: No acute disease. Atherosclerosis. Electronically Signed   By: Inge Rise M.D.   On: 10/24/2017 13:43     ASSESSMENT AND PLAN:   77 yo female w/ hx of COPD, anxiety, depression, restless leg syndrome, GERD, essential hypertension presented to the hospital due to shortness of breath noted to be in COPD exacerbation.  1.  COPD exacerbation- this is the cause of patient's worsening shortness of breath, wheezing. - Continue IV steroids but will taper as patient has improved.  Will place on schedule duo nebs, continue Pulmicort nebs.  Continue empiric Levaquin. -Continue theophylline.  2.  Migraine headaches-patient started on some Fioricet.  3.  Neuropathy-continue gabapentin.  4.  GERD-continue Protonix.  5.  Depression-continue Zoloft.  Discharge home tomorrow if continues to improve.   All the records are reviewed and case discussed with Care Management/Social Worker. Management plans discussed with the patient, family and they are in agreement.  CODE STATUS: DNR  DVT Prophylaxis: Hep SQ  TOTAL TIME TAKING CARE OF THIS PATIENT: 30 minutes.   POSSIBLE D/C IN 1-2 DAYS, DEPENDING ON CLINICAL CONDITION.   Henreitta Leber M.D on 10/25/2017 at 2:45 PM  Between 7am to 6pm - Pager - [phone]  After 6pm go to [email or web address] - [employer]  [facility]  Office  [phone]  CC: Primary care physician; Birdie Sons, MD

## 2017-10-25 NOTE — Telephone Encounter (Signed)
Pt's daughter Tye [place] called saying her mom needs a referral to [facility] to have her toenails cut.  When they called to make an appt [facility] said Dr. Caryn Section would need to send a referral for her to be seen.  She has an appt schd on June 3rd.  Can we send the referral asap.  Daughter Cathy's call is [phone]  Thanks teri

## 2017-10-26 MED ORDER — LEVOFLOXACIN 500 MG PO TABS: 500.0000 mg | ORAL_TABLET | Freq: Every day | ORAL | 0 refills | Status: AC

## 2017-10-26 MED ORDER — PREDNISONE 10 MG (21) PO TBPK: ORAL_TABLET | ORAL | 0 refills | Status: DC

## 2017-10-26 NOTE — Progress Notes (Signed)
Patient was discharged home with daughter. Instructions reviewed with both. Including scripts, meds, and last dose given. IV removed with cath intact. Allowed time for questions.

## 2017-10-28 ENCOUNTER — Telehealth: Payer: Self-pay

## 2017-10-28 NOTE — Telephone Encounter (Signed)
Error-duplicate chart

## 2017-10-28 NOTE — Telephone Encounter (Signed)
Transition Care Management Follow-up Telephone Call   Date discharged?  10/26/2017   How have you been since you were released from the hospital? "still weak today but getting some strength back"   Do you understand why you were in the hospital? yes   Do you understand the discharge instructions? yes   Where were you discharged to? home   Items Reviewed:  Medications reviewed: yes  Allergies reviewed: yes  Dietary changes reviewed: yes  Referrals reviewed: yes   Functional Questionnaire:   Activities of Daily Living (ADLs):   She states they are independent in the following: bathing and hygiene, feeding, continence, grooming, toileting and dressing States they require assistance with the following: ambulation   Any transportation issues/concerns?: no   Any patient concerns? no   Confirmed importance and date/time of follow-up visits scheduled yes  Physical on 11/08/2017 at 2pm. Patient declined coming in for separate appt for hospital follow up. Is requesting to discuss both at CPE on the 4th if possible..  Confirmed with patient if condition begins to worsen call PCP or go to the ER.  Patient was given the office number and encouraged to call back with question or concerns.  : yes

## 2017-11-01 ENCOUNTER — Other Ambulatory Visit: Payer: Self-pay

## 2017-11-01 ENCOUNTER — Telehealth: Payer: Self-pay

## 2017-11-01 MED ORDER — BUSPIRONE HCL 10 MG PO TABS: 10.0000 mg | ORAL_TABLET | Freq: Three times a day (TID) | ORAL | 1 refills | Status: DC

## 2017-11-01 NOTE — Telephone Encounter (Signed)
pt called states she was in the hospital from [place] to [facility] she states that is still nerves. feels up tight.

## 2017-11-01 NOTE — Patient Outreach (Signed)
[facility]) Care Management  11/01/2017  Vicki Morales 10/08/1940 [id number]   EMMI- General Discharge RED ON EMMI ALERT Day # 4 Date: 10/31/17 Red Alert Reason:  Lost interest in things? Yes Sad/hopeless/anxious/anxious/ empty? yes  Outreach attempt # 1 Spoke with patient.  She able to verify HIPAA. She reports that she is doing ok.  Patient reports she feels frustrated about recent illness and gaining her strength back.  Patient reports that she is slowly able to do things she normally does now and voices no concerns.  She states that her daughter assists her as needed. She has a PCP appointment on next week and has transportation.  Patient denies any needs at this time.    Plan: RN CM will send letter for future reference.  RN CM will close case as this time.    Jone Baseman, RN, MSN [facility] Care Management Care Management Coordinator Direct Line [phone] Toll Free: [phone]  Fax: [phone]

## 2017-11-01 NOTE — Telephone Encounter (Signed)
Called patient to discuss her anxiety sx. Will increase Buspar to 10 mg tid. Pt however also had a COPD excacerbation recently and was treated with steroids. Pt was hospitalized for that. Discussed with pt that steroids and her medical issues  can also cause anxiety and restlessness .

## 2017-11-03 ENCOUNTER — Ambulatory Visit: Payer: Medicare HMO | Admitting: Psychiatry

## 2017-11-03 NOTE — Discharge Summary (Signed)
[place] [facility] [place] NAME: Vicki Morales    MR#:  [id number]  DATE OF BIRTH:  [birth year]  DATE OF ADMISSION:  10/24/2017 ADMITTING PHYSICIAN: Vaughan Basta, MD  DATE OF DISCHARGE: 10/26/2017  1:18 PM  PRIMARY CARE PHYSICIAN: Birdie Sons, MD    ADMISSION DIAGNOSIS:  COPD exacerbation ([place]) [J44.1]  DISCHARGE DIAGNOSIS:  Principal Problem:   Acute respiratory failure with hypoxia ([place]) Active Problems:   COPD with acute exacerbation ([place])   SECONDARY DIAGNOSIS:   Past Medical History:  Diagnosis Date  . Anxiety   . Arthritis   . Cataract    right eye  . COPD (chronic obstructive pulmonary disease) ([place])   . Depression   . Dyspnea    DOE  . Dysrhythmia   . Edema    FEET/ANKLES  . GERD (gastroesophageal reflux disease)   . H/O wheezing   . History of hiatal hernia   . HOH (hard of hearing)   . Hypertension   . Incontinence of urine in female   . Orthopnea   . Pain    CHRONIC KNEE  . Pain    CHRONIC KNEE  . Paranoid disorder ([place])   . RLS (restless legs syndrome)   . Tremors of nervous system    HANDS    HOSPITAL COURSE:   77 yo female w/ hx of COPD, anxiety, depression, restless leg syndrome, GERD, essential hypertension presented to the hospital due to shortness of breath noted to be in COPD exacerbation.  1.  COPD exacerbation- this is the cause of patient's worsening shortness of breath, wheezing. - Continue IV steroids but will taper as patient has improved.  Will place on schedule duo nebs, continue Pulmicort nebs.  Continue empiric Levaquin. -Continue theophylline. - improved.  2.  Migraine headaches-patient started on some Fioricet.  3.  Neuropathy-continue gabapentin.  4.  GERD-continue Protonix.  5.  Depression-continue Zoloft.    DISCHARGE CONDITIONS:   Stable.  CONSULTS OBTAINED:    DRUG ALLERGIES:   Allergies  Allergen Reactions  . Arthrotec  [Diclofenac-Misoprostol] Hives  . Codeine Other (See Comments)    Headache   . Diclofenac Other (See Comments) and Hives    Pt unsure allergy  . Hydrochlorothiazide Other (See Comments)    Weakness   . Penicillins Hives    Has patient had a PCN reaction causing immediate rash, facial/tongue/throat swelling, SOB or lightheadedness with hypotension: No Has patient had a PCN reaction causing severe rash involving mucus membranes or skin necrosis: No Has patient had a PCN reaction that required hospitalization: No Has patient had a PCN reaction occurring within the last 10 years: No If all of the above answers are "NO", then may proceed with Cephalosporin use.  . Sulfa Antibiotics Hives  . Tiotropium Bromide Monohydrate Other (See Comments)    Blurry vision   . Tylenol [Acetaminophen] Other (See Comments)    Doesn't work  . Morphine Hives and Rash  . Pantoprazole Rash    DISCHARGE MEDICATIONS:   Allergies as of 10/26/2017      Reactions   Arthrotec [diclofenac-misoprostol] Hives   Codeine Other (See Comments)   Headache    Diclofenac Other (See Comments), Hives   Pt unsure allergy   Hydrochlorothiazide Other (See Comments)   Weakness    Penicillins Hives   Has patient had a PCN reaction causing immediate rash, facial/tongue/throat swelling, SOB or lightheadedness with hypotension: No Has patient had a PCN reaction causing severe  rash involving mucus membranes or skin necrosis: No Has patient had a PCN reaction that required hospitalization: No Has patient had a PCN reaction occurring within the last 10 years: No If all of the above answers are "NO", then may proceed with Cephalosporin use.   Sulfa Antibiotics Hives   Tiotropium Bromide Monohydrate Other (See Comments)   Blurry vision    Tylenol [acetaminophen] Other (See Comments)   Doesn't work   Morphine Hives, Rash   Pantoprazole Rash      Medication List    TAKE these medications   ADVAIR DISKUS 250-50 MCG/DOSE  Aepb Generic drug:  Fluticasone-Salmeterol INHALE 1 PUFF INTO THE LUNGS EVERY 12 HOURS AS NEEDED What changed:  See the new instructions.   albuterol (2.5 MG/3ML) 0.083% nebulizer solution Commonly known as:  PROVENTIL Take 3 mLs (2.5 mg total) by nebulization 2 (two) times daily as needed for wheezing or shortness of breath.   ALIGN 4 MG Caps Take 4 mg by mouth daily.   aspirin EC 325 MG tablet Take 1 tablet (325 mg total) by mouth daily. What changed:  when to take this   benazepril 5 MG tablet Commonly known as:  LOTENSIN TAKE 1 TABLET(5 MG) BY MOUTH DAILY What changed:  See the new instructions.   CALCIUM 600+D 600-400 MG-UNIT tablet Generic drug:  Calcium Carbonate-Vitamin D Take 1 tablet by mouth 2 (two) times daily.   Cranberry 250 MG Caps Take 1 capsule by mouth daily.   Fish Oil 1000 MG Caps Take 1,000 mg by mouth daily.   fluticasone 50 MCG/ACT nasal spray Commonly known as:  FLONASE Place 1 spray into both nostrils daily.   gabapentin 100 MG capsule Commonly known as:  NEURONTIN TAKE 1 CAPSULE(100 MG) BY MOUTH TWICE DAILY   gabapentin 300 MG capsule Commonly known as:  NEURONTIN TAKE 2 CAPSULES(600 MG) BY MOUTH AT BEDTIME   hydrOXYzine 25 MG tablet Commonly known as:  ATARAX/VISTARIL TAKE 1 TABLET(25 MG) BY MOUTH EVERY 8 HOURS AS NEEDED FOR ANXIETY   loratadine 10 MG tablet Commonly known as:  CLARITIN Take 10 mg by mouth daily.   magnesium oxide 400 MG tablet Commonly known as:  MAG-OX Take 400 mg by mouth 2 (two) times daily.   meloxicam 15 MG tablet Commonly known as:  MOBIC TAKE 1 TABLET BY MOUTH EVERY DAY WITH A MEALS   montelukast 10 MG tablet Commonly known as:  SINGULAIR TAKE 1 TABLET BY MOUTH EVERY DAY   multivitamin with minerals Tabs tablet Take 1 tablet by mouth daily.   omeprazole 20 MG capsule Commonly known as:  PRILOSEC Take 20 mg by mouth daily before breakfast.   OSTEO BI-FLEX ADV DOUBLE ST PO Take 1 tablet by mouth 2  (two) times daily.   oxybutynin 10 MG 24 hr tablet Commonly known as:  DITROPAN-XL TAKE 1 TABLET BY MOUTH AT BEDTIME   predniSONE 10 MG (21) Tbpk tablet Commonly known as:  STERAPRED UNI-PAK 21 TAB Take 6 tabs first day, 5 tab on day 2, then 4 on day 3rd, 3 tabs on day 4th , 2 tab on day 5th, and 1 tab on 6th day.   QUEtiapine 50 MG tablet Commonly known as:  SEROQUEL Take 2 tablets (100 mg total) by mouth at bedtime. What changed:  how much to take   ranitidine 150 MG tablet Commonly known as:  ZANTAC Take 150 mg by mouth at bedtime.   sertraline 50 MG tablet Commonly known as:  ZOLOFT Take  1 tablet (50 mg total) by mouth at bedtime.   theophylline 400 MG 24 hr tablet Commonly known as:  UNIPHYL TAKE 1 TABLET BY MOUTH DAILY.   tiZANidine 4 MG tablet Commonly known as:  ZANAFLEX Take 1 tablet (4 mg total) by mouth every 8 (eight) hours as needed for muscle spasms.   traMADol 50 MG tablet Commonly known as:  ULTRAM Take 2 tablets (100 mg total) by mouth 2 (two) times daily.   traZODone 50 MG tablet Commonly known as:  DESYREL Take 0.5-1 tablets (25-50 mg total) by mouth at bedtime as needed for sleep.     ASK your doctor about these medications   levofloxacin 500 MG tablet Commonly known as:  LEVAQUIN Take 1 tablet (500 mg total) by mouth daily for 3 days. Ask about: Should I take this medication?        DISCHARGE INSTRUCTIONS:    Follow with PMD.  If you experience worsening of your admission symptoms, develop shortness of breath, life threatening emergency, suicidal or homicidal thoughts you must seek medical attention immediately by calling 911 or calling your MD immediately  if symptoms less severe.  You Must read complete instructions/literature along with all the possible adverse reactions/side effects for all the Medicines you take and that have been prescribed to you. Take any new Medicines after you have completely understood and accept all the  possible adverse reactions/side effects.   Please note  You were cared for by a hospitalist during your hospital stay. If you have any questions about your discharge medications or the care you received while you were in the hospital after you are discharged, you can call the unit and asked to speak with the hospitalist on call if the hospitalist that took care of you is not available. Once you are discharged, your primary care physician will handle any further medical issues. Please note that NO REFILLS for any discharge medications will be authorized once you are discharged, as it is imperative that you return to your primary care physician (or establish a relationship with a primary care physician if you do not have one) for your aftercare needs so that they can reassess your need for medications and monitor your lab values.    Today   CHIEF COMPLAINT:   Chief Complaint  Patient presents with  . Shortness of Breath    HISTORY OF PRESENT ILLNESS:  Vicki Morales  is a 77 y.o. female with a known history of Cataract, COPD, depression, gastroesophageal reflux disease, hypertension, orthopnea- started having shortness of breath and cough for last 3-4 days which is progressively getting worse and she is not able to walk much without getting short of breath now. She denies any associated fever or chills and denies any sputum production. Came to emergency room and noted to be hypoxic started on oxygen supplementation and did not respond much to nebulizer therapy so given to hospitalist team for admission for COPD exacerbation with hypoxia.   VITAL SIGNS:  Blood pressure (!) 150/70, pulse 67, temperature 97.7 F (36.5 C), temperature source Oral, resp. rate 18, height 5' (1.524 m), weight 106.1 kg (234 lb), SpO2 95 %.  I/O:  No intake or output data in the 24 hours ending 11/03/17 2301  PHYSICAL EXAMINATION:   GENERAL:  77 y.o.-year-old patient lying in bed in no acute distress.  EYES:  Pupils equal, round, reactive to light and accommodation. No scleral icterus. Extraocular muscles intact.  HEENT: Head atraumatic, normocephalic. Oropharynx and nasopharynx clear.  NECK:  Supple, no jugular venous distention. No thyroid enlargement, no tenderness.  LUNGS: Good A/E b/l, diffuse end expiratory wheezing bilaterally, no rales, rhonchi.  Negative use of accessory muscles. CARDIOVASCULAR: S1, S2 normal. No murmurs, rubs, or gallops.  ABDOMEN: Soft, nontender, nondistended. Bowel sounds present. No organomegaly or mass.  EXTREMITIES: No cyanosis, clubbing or edema b/l.    NEUROLOGIC: Cranial nerves II through XII are intact. No focal Motor or sensory deficits b/l.   PSYCHIATRIC: The patient is alert and oriented x 3.  SKIN: No obvious rash, lesion, or ulcer.     DATA REVIEW:   CBC No results for input(s): WBC, HGB, HCT, PLT in the last 168 hours.  Chemistries  No results for input(s): NA, K, CL, CO2, GLUCOSE, BUN, CREATININE, CALCIUM, MG, AST, ALT, ALKPHOS, BILITOT in the last 168 hours.  Invalid input(s): GFRCGP  Cardiac Enzymes No results for input(s): TROPONINI in the last 168 hours.  Microbiology Results  Results for orders placed or performed in visit on 06/17/17  Urine Culture     Status: None   Collection Time: 06/17/17  3:36 PM  Result Value Ref Range Status   Urine Culture, Routine Final report  Final   Organism ID, Bacteria Comment  Final    Comment: Culture shows less than 10,000 colony forming units of bacteria per milliliter of urine. This colony count is not generally considered to be clinically significant.     RADIOLOGY:  No results found.  EKG:   Orders placed or performed during the hospital encounter of 10/24/17  . ED EKG  . ED EKG      Management plans discussed with the patient, family and they are in agreement.  CODE STATUS:  Code Status History    Date Active Date Inactive Code Status Order ID Comments User Context   10/24/2017  1844 10/26/2017 1623 DNR [id number]  Vaughan Basta, MD Inpatient   09/23/2015 0018 09/23/2015 1823 DNR [id number]  Lance Coon, MD Inpatient    Questions for Most Recent Historical Code Status (Order [id number])    Question Answer Comment   In the event of cardiac or respiratory ARREST Do not call a "code blue"    In the event of cardiac or respiratory ARREST Do not perform Intubation, CPR, defibrillation or ACLS    In the event of cardiac or respiratory ARREST Use medication by any route, position, wound care, and other measures to relive pain and suffering. May use oxygen, suction and manual treatment of airway obstruction as needed for comfort.         Advance Directive Documentation     Most Recent Value  Type of Advance Directive  Living will  Pre-existing out of facility DNR order (yellow form or pink MOST form)  -  "MOST" Form in Place?  -      TOTAL TIME TAKING CARE OF THIS PATIENT: 35 minutes.    Vaughan Basta M.D on 11/03/2017 at 11:01 PM  Between 7am to 6pm - Pager - [phone]  After 6pm go to [email or web address] - password [email or web address] [facility]  Office  [phone]  CC: Primary care physician; Birdie Sons, MD   Note: This dictation was prepared with Dragon dictation along with smaller phrase technology. Any transcriptional errors that result from this process are unintentional.

## 2017-11-07 ENCOUNTER — Encounter: Payer: Self-pay | Admitting: Podiatry

## 2017-11-07 ENCOUNTER — Ambulatory Visit (INDEPENDENT_AMBULATORY_CARE_PROVIDER_SITE_OTHER): Payer: Medicare HMO | Admitting: Podiatry

## 2017-11-07 DIAGNOSIS — L84 Corns and callosities: Secondary | ICD-10-CM | POA: Diagnosis not present

## 2017-11-07 DIAGNOSIS — B351 Tinea unguium: Secondary | ICD-10-CM

## 2017-11-07 DIAGNOSIS — M79676 Pain in unspecified toe(s): Secondary | ICD-10-CM | POA: Diagnosis not present

## 2017-11-07 NOTE — Progress Notes (Signed)
Patient: Vicki Morales, Female    DOB: [birth year], 77 y.o.   MRN: [id number] Visit Date: 11/08/2017  Today's Provider: Lelon Huh, MD   Chief Complaint  Patient presents with  . Annual Exam  . Hypertension   Subjective:   Patient saw [facility] for AWV on 09/20/2017.  Follow up hospitalization for COPD in May. She was discharged on prednisone and her previous inhaler regiment. Was started on buspirone in the hospital for anxiety which she states has been working well. She has follow up with psychiatrist this afternoon.   -----------------------------------------------------------   Hypertension, follow-up:  BP Readings from Last 3 Encounters:  10/26/17 (!) 150/70  09/20/17 136/62  06/17/17 140/70    She was last seen for hypertension 08/20/2016.  BP at that visit was 128/64. Management since that visit includes; labs checked, no changes.She reports good compliance with treatment. She is not having side effects. none She is not exercising. She is adherent to low salt diet.   Outside blood pressures are not checking. She is experiencing none.  Patient denies none.   Cardiovascular risk factors include advanced age (older than 45 for men, 55 for women).  Use of agents associated with hypertension: none.   -----------------------------------------------------------------    Estrogen deficiency From 08/20/2016-Bone Density ordered, but not yet done. Was reordered at wellness visit in April, but still not scheduled.      Review of Systems  Constitutional: Negative for appetite change, chills, fatigue and fever.  Respiratory: Negative for chest tightness and shortness of breath.   Cardiovascular: Negative for chest pain and palpitations.  Gastrointestinal: Negative for abdominal pain, nausea and vomiting.  Neurological: Negative for dizziness and weakness.    Social History   Socioeconomic History  . Marital status: Widowed    Spouse name: Not on file    . Number of children: 2  . Years of education: Not on file  . Highest education level: 12th grade  Occupational History  . 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 Financial resource strain: Not hard at all  . Food insecurity:    Worry: Never true    Inability: Never true  . Transportation needs:    Medical: No    Non-medical: No  Tobacco Use  . Smoking status: Former Smoker    Years: 30.00  . Smokeless tobacco: Never Used  . Tobacco comment: quit in 1989  Substance and Sexual Activity  . Alcohol use: No    Alcohol/week: 0.0 oz  . Drug use: No  . Sexual activity: Not Currently  Lifestyle  . Physical activity:    Days per week: 0 days    Minutes per session: 0 min  . Stress: Only a little  Relationships  . Social connections:    Talks on phone: Not on file    Gets together: Not on file    Attends religious service: Not on file    Active member of club or organization: Not on file    Attends meetings of clubs or organizations: Not on file    Relationship status: Not on file  . Intimate partner violence:    Fear of current or ex partner: No    Emotionally abused: No    Physically abused: No    Forced sexual activity: No  Other Topics Concern  . Not on file  Social History Narrative  . Not on file    Past Medical History:  Diagnosis Date  . Anxiety   .  Arthritis   . Cataract    right eye  . COPD (chronic obstructive pulmonary disease) ([place])   . Depression   . Dyspnea    DOE  . Dysrhythmia   . Edema    FEET/ANKLES  . GERD (gastroesophageal reflux disease)   . H/O wheezing   . History of hiatal hernia   . HOH (hard of hearing)   . Hypertension   . Incontinence of urine in female   . Orthopnea   . Pain    CHRONIC KNEE  . Pain    CHRONIC KNEE  . Paranoid disorder ([place])   . RLS (restless legs syndrome)   . Tremors of nervous system    HANDS     Patient Active Problem List   Diagnosis Date Noted  . Acute respiratory failure with  hypoxia ([place]) 10/24/2017  . COPD with acute exacerbation ([place]) 10/24/2017  . Nocturnal hypoxia 08/21/2016  . Restless leg syndrome 08/20/2016  . Impingement syndrome of shoulder region 07/29/2016  . Hyponatremia 03/21/2016  . Pneumonia 03/18/2016  . History of suicide attempt 03/16/2016  . Overdose 03/16/2016  . Pulmonary hypertension ([place]) 11/11/2015  . Anxiety 09/22/2015  . Depression 09/22/2015  . Osteoarthritis 09/10/2015  . Rosacea 07/03/2015  . Breast pain 11/07/2014  . Callus of foot 11/07/2014  . CN (constipation) 11/07/2014  . Dermatitis, eczematoid 11/07/2014  . Diverticulosis of colon 11/07/2014  . Dizziness 11/07/2014  . Can't get food down 11/07/2014  . Accumulation of fluid in tissues 11/07/2014  . Fatigue 11/07/2014  . FOM (frequency of micturition) 11/07/2014  . Tension type headache 11/07/2014  . LBP (low back pain) 11/07/2014  . Episodic mood disorder ([place]) 11/07/2014  . Extreme obesity 11/07/2014  . Muscle ache 11/07/2014  . Disturbance of skin sensation 11/07/2014  . Awareness of heartbeats 11/07/2014  . Jerking 11/07/2014  . Body tinea 11/07/2014  . Essential (primary) hypertension 10/10/2014  . Moderate COPD (chronic obstructive pulmonary disease) ([place]) 04/01/2014  . Hx of obesity 04/01/2014  . CAFL (chronic airflow limitation) ([place]) 01/25/2009  . Malaise and fatigue 11/26/2008  . Aphasia 09/26/2008  . 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 GERD (gastroesophageal reflux disease) 06/07/1998  . HLD (hyperlipidemia) 06/07/1998  . OP (osteoporosis) 06/07/1998  . H/O total hysterectomy 03/08/1987  . Schizophrenia, in remission ([place]) 06/07/1978    Past Surgical History:  Procedure Laterality Date  . ABDOMINAL HYSTERECTOMY  1988  . APPENDECTOMY  1988  . CARDIAC CATHETERIZATION    . CATARACT EXTRACTION W/PHACO Right 07/14/2016   Procedure: CATARACT EXTRACTION PHACO AND INTRAOCULAR LENS PLACEMENT ([place]) anterior vitrectomy;  Surgeon:  Estill Cotta, MD;  Location: [facility] ORS;  Service: Ophthalmology;  Laterality: Right;  [place] 02:40 AP% 24.4 CDE 59.98 Fluid pack # [id number]  . CATARACT EXTRACTION W/PHACO Left 02/16/2017   Procedure: CATARACT EXTRACTION PHACO AND INTRAOCULAR LENS PLACEMENT (IOC);  Surgeon: Estill Cotta, MD;  Location: [facility] ORS;  Service: Ophthalmology;  Laterality: Left;  Lot # [id number] H [place]: 01:11.8 AP%: 23.5 CDE: 32.09   . ESOPHAGOGASTRODUODENOSCOPY N/A 11/11/2014   Procedure: ESOPHAGOGASTRODUODENOSCOPY (EGD);  Surgeon: Josefine Class, MD;  Location: [facility] ENDOSCOPY;  Service: Endoscopy;  Laterality: N/A;  . TONSILLECTOMY AND ADENOIDECTOMY    . TRIGGER FINGER RELEASE  2004    Her family history includes Anxiety disorder in her brother and father; Breast cancer in her sister; Depression in her father; Emphysema in her father; Stroke in her father.      Current Outpatient Medications:  .  ADVAIR DISKUS 250-50 MCG/DOSE AEPB, INHALE 1 PUFF INTO THE LUNGS EVERY 12 HOURS AS NEEDED (Patient taking differently: INHALE 1 PUFF INTO THE LUNGS EVERY 12 HOURS), Disp: 1 each, Rfl: 12 .  albuterol (PROVENTIL) (2.5 MG/3ML) 0.083% nebulizer solution, Take 3 mLs (2.5 mg total) by nebulization 2 (two) times daily as needed for wheezing or shortness of breath., Disp: 75 mL, Rfl: 5 .  aspirin EC 325 MG tablet, Take 1 tablet (325 mg total) by mouth daily. (Patient taking differently: Take 325 mg by mouth at bedtime. ), Disp: 30 tablet, Rfl: 2 .  benazepril (LOTENSIN) 5 MG tablet, TAKE 1 TABLET(5 MG) BY MOUTH DAILY (Patient taking differently: TAKE 1 TABLET(5 MG) BY MOUTH DAILY at bedtime), Disp: 30 tablet, Rfl: 11 .  busPIRone (BUSPAR) 10 MG tablet, Take 1 tablet (10 mg total) by mouth 3 (three) times daily. (Patient taking differently: Take 10 mg by mouth 3 (three) times daily. Patient is taking 1 to 1 1/2 tablet qd), Disp: 90 tablet, Rfl: 1 .  Calcium Carbonate-Vitamin D (CALCIUM 600+D) 600-400 MG-UNIT tablet, Take 1  tablet by mouth 2 (two) times daily. , Disp: , Rfl:  .  Cranberry 250 MG CAPS, Take 1 capsule by mouth daily. , Disp: , Rfl:  .  fluticasone (FLONASE) 50 MCG/ACT nasal spray, Place 1 spray into both nostrils daily., Disp: , Rfl:  .  gabapentin (NEURONTIN) 100 MG capsule, TAKE 1 CAPSULE(100 MG) BY MOUTH TWICE DAILY, Disp: 60 capsule, Rfl: 11 .  gabapentin (NEURONTIN) 300 MG capsule, TAKE 2 CAPSULES(600 MG) BY MOUTH AT BEDTIME, Disp: 60 capsule, Rfl: 11 .  hydrOXYzine (ATARAX/VISTARIL) 25 MG tablet, TAKE 1 TABLET(25 MG) BY MOUTH EVERY 8 HOURS AS NEEDED FOR ANXIETY, Disp: 60 tablet, Rfl: 5 .  loratadine (CLARITIN) 10 MG tablet, Take 10 mg by mouth daily., Disp: , Rfl:  .  magnesium oxide (MAG-OX) 400 MG tablet, Take 400 mg by mouth 2 (two) times daily., Disp: , Rfl:  .  Melatonin 10 MG CAPS, Take 10 mg by mouth at bedtime as needed., Disp: , Rfl:  .  meloxicam (MOBIC) 15 MG tablet, TAKE 1 TABLET BY MOUTH EVERY DAY WITH A MEALS, Disp: , Rfl:  .  Misc Natural Products (OSTEO BI-FLEX ADV DOUBLE ST PO), Take 1 tablet by mouth 2 (two) times daily. , Disp: , Rfl:  .  montelukast (SINGULAIR) 10 MG tablet, TAKE 1 TABLET BY MOUTH EVERY DAY, Disp: 30 tablet, Rfl: 12 .  Multiple Vitamin (MULTIVITAMIN WITH MINERALS) TABS tablet, Take 1 tablet by mouth daily., Disp: , Rfl:  .  Omega-3 Fatty Acids (FISH OIL) 1000 MG CAPS, Take 1,000 mg by mouth daily. , Disp: , Rfl:  .  omeprazole (PRILOSEC) 20 MG capsule, Take 20 mg by mouth daily before breakfast. , Disp: , Rfl:  .  oxybutynin (DITROPAN-XL) 10 MG 24 hr tablet, TAKE 1 TABLET BY MOUTH AT BEDTIME, Disp: 30 tablet, Rfl: 5 .  Probiotic Product (ALIGN) 4 MG CAPS, Take 4 mg by mouth daily. , Disp: , Rfl:  .  QUEtiapine (SEROQUEL) 50 MG tablet, Take 2 tablets (100 mg total) by mouth at bedtime., Disp: 60 tablet, Rfl: 1 .  ranitidine (ZANTAC) 150 MG tablet, Take 150 mg by mouth at bedtime., Disp: , Rfl:  .  sertraline (ZOLOFT) 50 MG tablet, Take 1 tablet (50 mg total) by  mouth at bedtime., Disp: 30 tablet, Rfl: 2 .  theophylline (UNIPHYL) 400 MG 24 hr tablet, TAKE 1 TABLET BY MOUTH  DAILY., Disp: 30 tablet, Rfl: 12 .  tiZANidine (ZANAFLEX) 4 MG tablet, Take 1 tablet (4 mg total) by mouth every 8 (eight) hours as needed for muscle spasms., Disp: 30 tablet, Rfl: 5 .  traMADol (ULTRAM) 50 MG tablet, Take 2 tablets (100 mg total) by mouth 2 (two) times daily., Disp: 60 tablet, Rfl: 3 .  traZODone (DESYREL) 50 MG tablet, Take 0.5-1 tablets (25-50 mg total) by mouth at bedtime as needed for sleep., Disp: 30 tablet, Rfl: 2 .  predniSONE (STERAPRED UNI-PAK 21 TAB) 10 MG (21) TBPK tablet, Take 6 tabs first day, 5 tab on day 2, then 4 on day 3rd, 3 tabs on day 4th , 2 tab on day 5th, and 1 tab on 6th day. (Patient not taking: Reported on 11/08/2017), Disp: 21 tablet, Rfl: 0  Patient Care Team: Birdie Sons, MD as PCP - General (Family Medicine) Dingeldein, Remo Lipps, MD as Consulting Physician (Ophthalmology) Erby Pian, MD as Referring Physician (Specialist) Teodoro Spray, MD as Consulting Physician (Cardiology) Ursula Alert, MD as Consulting Physician (Psychiatry)     Objective:   Vitals: BP 138/62 (BP Location: Right Arm, Patient Position: Sitting, Cuff Size: Large)   Pulse 80   Temp 98.7 F (37.1 C) (Oral)   Resp 16   SpO2 96%   Physical Exam   General Appearance:    Alert, cooperative, no distress, appears stated age, obese  Head:    Normocephalic, without obvious abnormality, atraumatic  Eyes:    PERRL, conjunctiva/corneas clear, EOM's intact, fundi    benign, both eyes  Ears:    Normal TM's and external ear canals, both ears  Nose:   Nares normal, septum midline, mucosa normal, no drainage    or sinus tenderness  Throat:   Lips, mucosa, and tongue normal; teeth and gums normal  Neck:   Supple, symmetrical, trachea midline, no adenopathy;    thyroid:  no enlargement/tenderness/nodules; no carotid   bruit or JVD  Back:     Symmetric, no  curvature, ROM normal, no CVA tenderness  Lungs:     Clear to auscultation bilaterally, respirations unlabored  Chest Wall:    No tenderness or deformity   Heart:    Regular rate and rhythm, S1 and S2 normal, no murmur, rub   or gallop  Breast Exam:    normal appearance, no masses or tenderness  Abdomen:     Soft, non-tender, bowel sounds active all four quadrants,    no masses, no organomegaly  Pelvic:    deferred  Extremities:   Extremities normal, atraumatic, no cyanosis or edema  Pulses:   2+ and symmetric all extremities  Skin:   Skin color, texture, turgor normal, no rashes or lesions  Lymph nodes:   Cervical, supraclavicular, and axillary nodes normal  Neurologic:   CNII-XII intact, normal strength, sensation and reflexes    throughout    Activities of Daily Living In your present state of health, do you have any difficulty performing the following activities: 10/24/2017 09/20/2017  Hearing? N Y  Comment - Not interested in hearing aids.   Vision? N N  Difficulty concentrating or making decisions? N N  Walking or climbing stairs? Y N  Dressing or bathing? N Y  Doing errands, shopping? N N  Preparing Food and eating ? - Y  Comment - Daughter cooks  Using the Toilet? - N  In the past six months, have you accidently leaked urine? - Y  Comment - Incontinent- wears depends.  Do you have problems with loss of bowel control? - N  Managing your Medications? - Y  Comment - Daughter manages this for pt.  Managing your Finances? - Y  Comment - Daughter takes care of.  Housekeeping or managing your Housekeeping? - Y  Comment - Daughter cleans around home.  Some recent data might be hidden    Fall Risk Assessment Fall Risk  09/20/2017 08/20/2016 03/18/2015  Falls in the past year? No No No     Depression Screen PHQ 2/9 Scores 09/20/2017 09/20/2017 08/20/2016 08/20/2016  PHQ - 2 Score 1 1 0 0  PHQ- 9 Score 7 - 7 -      Assessment & Plan:    Annual Physical Reviewed patient's  Family Medical History Reviewed and updated list of patient's medical providers Assessment of cognitive impairment was done Assessed patient's functional ability Established a written schedule for health screening [place] Completed and Reviewed  Exercise Activities and Dietary recommendations Goals    None      Immunization History  Administered Date(s) Administered  . Influenza Split 04/03/2008, 03/31/2009, 02/20/2010, 02/24/2011  . Influenza, High Dose Seasonal PF 02/25/2014, 03/18/2015, 03/24/2017  . Influenza,inj,Quad PF,6+ Mos 03/31/2013, 03/19/2016  . Pneumococcal Conjugate-13 02/25/2014  . Pneumococcal Polysaccharide-23 12/06/1999, 04/15/2005  . Tdap 05/07/2011    Health Maintenance  Topic Date Due  . DEXA SCAN  05/01/2016  . INFLUENZA VACCINE  01/05/2018  . TETANUS/TDAP  05/06/2021  . PNA vac Low Risk Adult  Completed     Discussed health benefits of physical activity, and encouraged her to engage in regular exercise appropriate for her age and condition.    ------------------------------------------------------------------------------------------------------------  1. Annual physical exam Counseled on potential tendon injuries associated with flouroquinolones and to avoid strenuous activities for the next few weeks.    2. Osteoporosis, unspecified osteoporosis type, unspecified pathological fracture presence  - DG Bone Density; Future  3. COPD with acute exacerbation (HCC) Change Advair to - Fluticasone-Umeclidin-Vilant (TRELEGY ELLIPTA) 100-62.5-25 MCG/INH AEPB; Inhale 1 puff into the lungs daily.  Dispense: 1 each; Refill: 2  4. Essential (primary) hypertension Well controlled.  Continue current medications.    Return in about 4 months (around 03/10/2018).     Lelon Huh, MD  [facility]

## 2017-11-07 NOTE — Progress Notes (Addendum)
Complaint:  Visit Type: Patient returns to my office for continued preventative foot care services. Complaint: Patient states" my nails have grown long and thick and become painful to walk and wear shoes"  The patient presents for preventative foot care services. No changes to ROS.  She has not been seen for over 5 months.  Podiatric Exam: Vascular: dorsalis pedis and posterior tibial pulses are palpable bilateral. Capillary return is immediate. Temperature gradient is WNL. Skin turgor WNL  Sensorium: Normal Semmes Weinstein monofilament test. Normal tactile sensation bilaterally. Nail Exam: Pt has thick disfigured discolored nails with subungual debris noted bilateral entire nail hallux through fifth toenails Ulcer Exam: There is no evidence of ulcer or pre-ulcerative changes or infection. Orthopedic Exam: Muscle tone and strength are WNL. No limitations in general ROM. No crepitus or effusions noted. Foot type and digits show no abnormalities. Bony prominences are unremarkable. Skin: Asymptomatic   Porokeratosis  Hallux  B/L. No infection or ulcers  Diagnosis:  Onychomycosis, , Pain in right toe, pain in left toes, Callus hallux  B/L  Treatment & Plan Procedures and Treatment: Consent by patient was obtained for treatment procedures.   Debridement of mycotic and hypertrophic toenails, 1 through 5 bilateral and clearing of subungual debris. No ulceration, no infection noted. Debride left callus Return Visit-Office Procedure: Patient instructed to return to the office for a follow up visit 3 months for continued evaluation and treatment.   I recommended she allow me to perform preventative foot care services       Gardiner Barefoot DPM

## 2017-11-08 ENCOUNTER — Other Ambulatory Visit: Payer: Self-pay

## 2017-11-08 ENCOUNTER — Ambulatory Visit (INDEPENDENT_AMBULATORY_CARE_PROVIDER_SITE_OTHER): Payer: Medicare HMO | Admitting: Psychiatry

## 2017-11-08 ENCOUNTER — Ambulatory Visit (INDEPENDENT_AMBULATORY_CARE_PROVIDER_SITE_OTHER): Payer: Medicare HMO | Admitting: Family Medicine

## 2017-11-08 ENCOUNTER — Encounter: Payer: Self-pay | Admitting: Psychiatry

## 2017-11-08 ENCOUNTER — Encounter: Payer: Self-pay | Admitting: Family Medicine

## 2017-11-08 VITALS — BP 138/62 | HR 80 | Temp 98.7°F | Resp 16

## 2017-11-08 VITALS — BP 151/96 | HR 97 | Temp 98.4°F | Wt 253.0 lb

## 2017-11-08 DIAGNOSIS — Z0000 Encounter for general adult medical examination without abnormal findings: Secondary | ICD-10-CM | POA: Diagnosis not present

## 2017-11-08 DIAGNOSIS — I10 Essential (primary) hypertension: Secondary | ICD-10-CM

## 2017-11-08 DIAGNOSIS — F410 Panic disorder [episodic paroxysmal anxiety] without agoraphobia: Secondary | ICD-10-CM

## 2017-11-08 DIAGNOSIS — J441 Chronic obstructive pulmonary disease with (acute) exacerbation: Secondary | ICD-10-CM

## 2017-11-08 DIAGNOSIS — M810 Age-related osteoporosis without current pathological fracture: Secondary | ICD-10-CM | POA: Diagnosis not present

## 2017-11-08 DIAGNOSIS — F200 Paranoid schizophrenia: Secondary | ICD-10-CM | POA: Diagnosis not present

## 2017-11-08 DIAGNOSIS — F3342 Major depressive disorder, recurrent, in full remission: Secondary | ICD-10-CM

## 2017-11-08 MED ORDER — QUETIAPINE FUMARATE 25 MG PO TABS: 25.0000 mg | ORAL_TABLET | Freq: Every evening | ORAL | 2 refills | Status: DC | PRN

## 2017-11-08 MED ORDER — SERTRALINE HCL 50 MG PO TABS: 50.0000 mg | ORAL_TABLET | Freq: Every day | ORAL | 1 refills | Status: DC

## 2017-11-08 MED ORDER — BUSPIRONE HCL 10 MG PO TABS: 10.0000 mg | ORAL_TABLET | Freq: Three times a day (TID) | ORAL | 1 refills | Status: DC

## 2017-11-08 MED ORDER — FLUTICASONE-UMECLIDIN-VILANT 100-62.5-25 MCG/INH IN AEPB: 1.0000 | INHALATION_SPRAY | Freq: Every day | RESPIRATORY_TRACT | 2 refills | Status: DC

## 2017-11-08 MED ORDER — QUETIAPINE FUMARATE 50 MG PO TABS
75.0000 mg | ORAL_TABLET | Freq: Every day | ORAL | 1 refills | Status: DC
Start: 2017-11-08 — End: 2017-12-02

## 2017-11-08 MED ORDER — TRAZODONE HCL 50 MG PO TABS: 25.0000 mg | ORAL_TABLET | Freq: Every evening | ORAL | 1 refills | Status: DC | PRN

## 2017-11-08 NOTE — Patient Instructions (Addendum)
   If your insurance will cover Trelegy then take in place of Advair

## 2017-11-08 NOTE — Progress Notes (Signed)
[facility] MD OP Progress Note  11/08/2017 5:28 PM Vicki Morales  MRN:  [id number]  Chief Complaint: ' I am here for follow up." Chief Complaint    Follow-up; Medication Refill     HPI: Vicki Morales is a 77 year old female, widowed, retired, lives in [place], presented to the clinic today with her daughter.  Patient has a history of schizophrenia as well as MDD.  Patient today reports that she had COPD exacerbation recently ,which was treated and currently resolved.  Patient when she was going through the COPD exacerbation felt anxious and had sleep problems.  Patient had called the clinic at that time and her medications were readjusted.  She reports she has been taking the BuSpar 10 mg 3 times a day.  She reports that is helpful.  She was advised to go up on the Seroquel to 100 mg however she reports she has been taking the 75 and did not go up on the dosage.  She reports she is happy with her medication regimen at this time since she feels her mood symptoms as stable.  Per daughter there are nights when she continues to struggle with sleep but she has been taking melatonin over-the-counter which has been helpful.   Patient reports she wants to continue on the same regimen.  Patient denies any other concerns today.  She denies any perceptual disturbances.  She denies any side effects to the medications.  Patient continues to have good social support from her daughter.   Visit Diagnosis:    ICD-10-CM   1. Schizophrenia, paranoid ([place]) F20.0 sertraline (ZOLOFT) 50 MG tablet    traZODone (DESYREL) 50 MG tablet  2. Panic attacks F41.0   3. MDD (major depressive disorder), recurrent, in full remission ([place]) F33.42     Past Psychiatric History: Reviewed past psychiatric history from my progress note on 09/05/2017  Past Medical History:  Past Medical History:  Diagnosis Date  . Anxiety   . Arthritis   . Cataract    right eye  . COPD (chronic obstructive pulmonary disease) ([place])   . Depression    . Dyspnea    DOE  . Dysrhythmia   . Edema    FEET/ANKLES  . GERD (gastroesophageal reflux disease)   . H/O wheezing   . History of hiatal hernia   . HOH (hard of hearing)   . Hypertension   . Incontinence of urine in female   . Orthopnea   . Pain    CHRONIC KNEE  . Pain    CHRONIC KNEE  . Paranoid disorder ([place])   . RLS (restless legs syndrome)   . Tremors of nervous system    HANDS    Past Surgical History:  Procedure Laterality Date  . ABDOMINAL HYSTERECTOMY  1988  . APPENDECTOMY  1988  . CARDIAC CATHETERIZATION    . CATARACT EXTRACTION W/PHACO Right 07/14/2016   Procedure: CATARACT EXTRACTION PHACO AND INTRAOCULAR LENS PLACEMENT ([place]) anterior vitrectomy;  Surgeon: Estill Cotta, MD;  Location: [facility] ORS;  Service: Ophthalmology;  Laterality: Right;  [place] 02:40 AP% 24.4 CDE 59.98 Fluid pack # [id number]  . CATARACT EXTRACTION W/PHACO Left 02/16/2017   Procedure: CATARACT EXTRACTION PHACO AND INTRAOCULAR LENS PLACEMENT (IOC);  Surgeon: Estill Cotta, MD;  Location: [facility] ORS;  Service: Ophthalmology;  Laterality: Left;  Lot # [id number] H [place]: 01:11.8 AP%: 23.5 CDE: 32.09   . ESOPHAGOGASTRODUODENOSCOPY N/A 11/11/2014   Procedure: ESOPHAGOGASTRODUODENOSCOPY (EGD);  Surgeon: Josefine Class, MD;  Location: [facility] ENDOSCOPY;  Service: Endoscopy;  Laterality:  N/A;  . TONSILLECTOMY AND ADENOIDECTOMY    . TRIGGER FINGER RELEASE  2004    Family Psychiatric History: Reviewed family psychiatric history from my progress note on 09/05/2017  Family History:  Family History  Problem Relation Age of Onset  . Breast cancer Sister   . Stroke Father   . Emphysema Father   . Anxiety disorder Father   . Depression Father   . Anxiety disorder Brother    Substance abuse history: Denies  Social History: She is widowed.  She has 2 children, a son and a daughter.  She lives with her daughter.  She is on SSI. Social History   Socioeconomic History  . Marital status: Widowed     Spouse name: Not on file  . Number of children: 2  . Years of education: Not on file  . Highest education level: 12th grade  Occupational History  . 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 Financial resource strain: Not hard at all  . Food insecurity:    Worry: Never true    Inability: Never true  . Transportation needs:    Medical: No    Non-medical: No  Tobacco Use  . Smoking status: Former Smoker    Years: 30.00  . Smokeless tobacco: Never Used  . Tobacco comment: quit in 1989  Substance and Sexual Activity  . Alcohol use: No    Alcohol/week: 0.0 oz  . Drug use: No  . Sexual activity: Not Currently  Lifestyle  . Physical activity:    Days per week: 0 days    Minutes per session: 0 min  . Stress: Only a little  Relationships  . Social connections:    Talks on phone: Not on file    Gets together: Not on file    Attends religious service: Not on file    Active member of club or organization: Not on file    Attends meetings of clubs or organizations: Not on file    Relationship status: Not on file  Other Topics Concern  . Not on file  Social History Narrative  . Not on file    Allergies:  Allergies  Allergen Reactions  . Arthrotec [Diclofenac-Misoprostol] Hives  . Codeine Other (See Comments)    Headache   . Diclofenac Other (See Comments) and Hives    Pt unsure allergy  . Hydrochlorothiazide Other (See Comments)    Weakness   . Penicillins Hives    Has patient had a PCN reaction causing immediate rash, facial/tongue/throat swelling, SOB or lightheadedness with hypotension: No Has patient had a PCN reaction causing severe rash involving mucus membranes or skin necrosis: No Has patient had a PCN reaction that required hospitalization: No Has patient had a PCN reaction occurring within the last 10 years: No If all of the above answers are "NO", then may proceed with Cephalosporin use.  . Sulfa Antibiotics Hives  . Tiotropium Bromide Monohydrate  Other (See Comments)    Blurry vision   . Tylenol [Acetaminophen] Other (See Comments)    Doesn't work  . Morphine Hives and Rash  . Pantoprazole Rash    Metabolic Disorder Labs: Lab Results  Component Value Date   HGBA1C 5.1 09/10/2015   No results found for: PROLACTIN Lab Results  Component Value Date   CHOL 207 (H) 08/20/2016   TRIG 66 08/20/2016   HDL 80 08/20/2016   CHOLHDL 2.6 08/20/2016   VLDL 12 11/01/2012   LDLCALC 114 (H) 08/20/2016  [place] 132 03/27/2014   Lab Results  Component Value Date   TSH 1.303 05/12/2017   TSH 1.970 08/20/2016    Therapeutic Level Labs: No results found for: LITHIUM No results found for: VALPROATE No components found for:  CBMZ  Current Medications: Current Outpatient Medications  Medication Sig Dispense Refill  . ADVAIR DISKUS 250-50 MCG/DOSE AEPB INHALE 1 PUFF INTO THE LUNGS EVERY 12 HOURS AS NEEDED (Patient taking differently: INHALE 1 PUFF INTO THE LUNGS EVERY 12 HOURS) 1 each 12  . albuterol (PROVENTIL) (2.5 MG/3ML) 0.083% nebulizer solution Take 3 mLs (2.5 mg total) by nebulization 2 (two) times daily as needed for wheezing or shortness of breath. 75 mL 5  . aspirin EC 325 MG tablet Take 1 tablet (325 mg total) by mouth daily. (Patient taking differently: Take 325 mg by mouth at bedtime. ) 30 tablet 2  . benazepril (LOTENSIN) 5 MG tablet TAKE 1 TABLET(5 MG) BY MOUTH DAILY (Patient taking differently: TAKE 1 TABLET(5 MG) BY MOUTH DAILY at bedtime) 30 tablet 11  . busPIRone (BUSPAR) 10 MG tablet Take 1 tablet (10 mg total) by mouth 3 (three) times daily. 270 tablet 1  . Calcium Carbonate-Vitamin D (CALCIUM 600+D) 600-400 MG-UNIT tablet Take 1 tablet by mouth 2 (two) times daily.     . Cranberry 250 MG CAPS Take 1 capsule by mouth daily.     . fluticasone (FLONASE) 50 MCG/ACT nasal spray Place 1 spray into both nostrils daily.    . Fluticasone-Umeclidin-Vilant (TRELEGY ELLIPTA) 100-62.5-25 MCG/INH AEPB Inhale 1 puff into the lungs  daily. 1 each 2  . gabapentin (NEURONTIN) 100 MG capsule TAKE 1 CAPSULE(100 MG) BY MOUTH TWICE DAILY 60 capsule 11  . gabapentin (NEURONTIN) 300 MG capsule TAKE 2 CAPSULES(600 MG) BY MOUTH AT BEDTIME 60 capsule 11  . hydrOXYzine (ATARAX/VISTARIL) 25 MG tablet TAKE 1 TABLET(25 MG) BY MOUTH EVERY 8 HOURS AS NEEDED FOR ANXIETY 60 tablet 5  . loratadine (CLARITIN) 10 MG tablet Take 10 mg by mouth daily.    . magnesium oxide (MAG-OX) 400 MG tablet Take 400 mg by mouth 2 (two) times daily.    . Melatonin 10 MG CAPS Take 10 mg by mouth at bedtime as needed.    . meloxicam (MOBIC) 15 MG tablet TAKE 1 TABLET BY MOUTH EVERY DAY WITH A MEALS    . Misc Natural Products (OSTEO BI-FLEX ADV DOUBLE ST PO) Take 1 tablet by mouth 2 (two) times daily.     . montelukast (SINGULAIR) 10 MG tablet TAKE 1 TABLET BY MOUTH EVERY DAY 30 tablet 12  . Multiple Vitamin (MULTIVITAMIN WITH MINERALS) TABS tablet Take 1 tablet by mouth daily.    . Omega-3 Fatty Acids (FISH OIL) 1000 MG CAPS Take 1,000 mg by mouth daily.     [name] Kitchen omeprazole (PRILOSEC) 20 MG capsule Take 20 mg by mouth daily before breakfast.     . oxybutynin (DITROPAN-XL) 10 MG 24 hr tablet TAKE 1 TABLET BY MOUTH AT BEDTIME 30 tablet 5  . Probiotic Product (ALIGN) 4 MG CAPS Take 4 mg by mouth daily.     . ranitidine (ZANTAC) 150 MG tablet Take 150 mg by mouth at bedtime.    . sertraline (ZOLOFT) 50 MG tablet Take 1 tablet (50 mg total) by mouth at bedtime. 90 tablet 1  . theophylline (UNIPHYL) 400 MG 24 hr tablet TAKE 1 TABLET BY MOUTH DAILY. 30 tablet 12  . tiZANidine (ZANAFLEX) 4 MG tablet Take 1 tablet (4 mg total) by  mouth every 8 (eight) hours as needed for muscle spasms. 30 tablet 5  . traMADol (ULTRAM) 50 MG tablet Take 2 tablets (100 mg total) by mouth 2 (two) times daily. 60 tablet 3  . traZODone (DESYREL) 50 MG tablet Take 0.5-1 tablets (25-50 mg total) by mouth at bedtime as needed for sleep. 90 tablet 1  . QUEtiapine (SEROQUEL) 25 MG tablet Take 1 tablet  (25 mg total) by mouth at bedtime as needed. For sleep 30 tablet 2  . QUEtiapine (SEROQUEL) 50 MG tablet Take 1.5 tablets (75 mg total) by mouth at bedtime. 135 tablet 1   No current facility-administered medications for this visit.      Musculoskeletal: Strength & Muscle Tone: within normal limits Gait & Station: walks with walker Patient leans: N/A  Psychiatric Specialty Exam: Review of Systems  Psychiatric/Behavioral: The patient is nervous/anxious.   All other systems reviewed and are negative.   Blood pressure (!) 151/96, pulse 97, temperature 98.4 F (36.9 C), temperature source Oral, weight 253 lb (114.8 kg).Body mass index is 49.41 kg/m.  General Appearance: Casual  Eye Contact:  Fair  Speech:  Clear and Coherent  Volume:  Normal  Mood:  Anxious  Affect:  Congruent  Thought Process:  Goal Directed and Descriptions of Associations: Intact  Orientation:  Full (Time, Place, and Person)  Thought Content: Logical   Suicidal Thoughts:  No  Homicidal Thoughts:  No  Memory:  Immediate;   Fair Recent;   Fair Remote;   Fair  Judgement:  Fair  Insight:  Fair  Psychomotor Activity:  Normal  Concentration:  Concentration: Fair and Attention Span: Fair  Recall:  [employer] of Knowledge: Fair  Language: Fair  Akathisia:  No  Handed:  Right  AIMS (if indicated): denies tremors, rigidity, stiffness  Assets:  Communication Skills Desire for Improvement Social Support  ADL's:  Intact  Cognition: WNL  Sleep:  Fair   Screenings: PHQ2-9     Clinical Support from 09/20/2017 in [facility] from 08/20/2016 in [facility] Visit from 03/18/2015 in [place]  PHQ-2 Total Score  1  0  0  PHQ-9 Total Score  7  7  3        Assessment and Plan: Vicki Morales is a 77 yr old caucasian female who has a history of schizophrenia, depression, presented to the clinic today for a follow-up visit.  Patient reports she is  currently tolerating the medication well.  Continues to have some restlessness at night.  Discussed making medication readjustment as noted below.  Plan as noted below.  Plan MDD in full remission Continue Zoloft 50 mg p.o. daily  For panic attacks Continue BuSpar 10 mg p.o. 3 times daily  For Schizophrenia Continue Seroquel 75 mg p.o. nightly Add Seroquel 25 mg p.o. daily as needed.   For insomnia Trazodone 25-50 mg p.o. nightly as needed Seroquel 25 mg p.o. nightly as needed   Follow up in clinic in 2 months or sooner if needed.  More than 50 % of the time was spent for psychoeducation and supportive psychotherapy and care coordination.  This note was generated in part or whole with voice recognition software. Voice recognition is usually quite accurate but there are transcription errors that can and very often do occur. I apologize for any typographical errors that were not detected and corrected.        Ursula Alert, MD 11/08/2017, 5:28 PM

## 2017-11-14 ENCOUNTER — Telehealth: Payer: Self-pay

## 2017-11-14 NOTE — Telephone Encounter (Signed)
Attempted to contact patient - pt reported to contact her daughter. Attempted to call daughter - not available.

## 2017-11-14 NOTE — Telephone Encounter (Signed)
pt daughter called states her mom is not doing good. she wants to know if you would admit her. states that with the medication  change last time she just not the same and feel " off"

## 2017-11-14 NOTE — Telephone Encounter (Signed)
Called patient's daughter. She returned my call . Discussed patient status. Per daughter she has days when she is not doing well. Daughter feels , pt agrees she may need inpatient admission. Discussed that geropsychiatric units will be suitable for her like [place]. Discussed to bring in for medication changes or go to nearest ED.

## 2017-11-15 DIAGNOSIS — Z79899 Other long term (current) drug therapy: Secondary | ICD-10-CM | POA: Diagnosis not present

## 2017-11-15 DIAGNOSIS — J449 Chronic obstructive pulmonary disease, unspecified: Secondary | ICD-10-CM | POA: Diagnosis not present

## 2017-11-15 DIAGNOSIS — Z5181 Encounter for therapeutic drug level monitoring: Secondary | ICD-10-CM | POA: Diagnosis not present

## 2017-11-20 ENCOUNTER — Other Ambulatory Visit: Payer: Self-pay | Admitting: Family Medicine

## 2017-11-24 ENCOUNTER — Emergency Department
Admission: EM | Admit: 2017-11-24 | Discharge: 2017-11-25 | Disposition: A | Discharge status: Other Acute Inpatient Hospital | Payer: Medicare HMO | Attending: Emergency Medicine | Admitting: Emergency Medicine

## 2017-11-24 ENCOUNTER — Encounter: Payer: Self-pay | Admitting: Emergency Medicine

## 2017-11-24 ENCOUNTER — Emergency Department: Payer: Medicare HMO

## 2017-11-24 DIAGNOSIS — Z7982 Long term (current) use of aspirin: Secondary | ICD-10-CM | POA: Diagnosis not present

## 2017-11-24 DIAGNOSIS — J181 Lobar pneumonia, unspecified organism: Secondary | ICD-10-CM | POA: Insufficient documentation

## 2017-11-24 DIAGNOSIS — Z79899 Other long term (current) drug therapy: Secondary | ICD-10-CM | POA: Insufficient documentation

## 2017-11-24 DIAGNOSIS — R45851 Suicidal ideations: Secondary | ICD-10-CM | POA: Diagnosis not present

## 2017-11-24 DIAGNOSIS — J189 Pneumonia, unspecified organism: Secondary | ICD-10-CM

## 2017-11-24 DIAGNOSIS — R0602 Shortness of breath: Secondary | ICD-10-CM | POA: Diagnosis not present

## 2017-11-24 DIAGNOSIS — F203 Undifferentiated schizophrenia: Secondary | ICD-10-CM | POA: Insufficient documentation

## 2017-11-24 DIAGNOSIS — J441 Chronic obstructive pulmonary disease with (acute) exacerbation: Secondary | ICD-10-CM | POA: Diagnosis not present

## 2017-11-24 DIAGNOSIS — I10 Essential (primary) hypertension: Secondary | ICD-10-CM | POA: Insufficient documentation

## 2017-11-24 DIAGNOSIS — Z87891 Personal history of nicotine dependence: Secondary | ICD-10-CM | POA: Insufficient documentation

## 2017-11-24 DIAGNOSIS — F419 Anxiety disorder, unspecified: Secondary | ICD-10-CM | POA: Diagnosis present

## 2017-11-24 DIAGNOSIS — R443 Hallucinations, unspecified: Secondary | ICD-10-CM | POA: Diagnosis not present

## 2017-11-24 LAB — CBC WITH DIFFERENTIAL/PLATELET
Basophils Absolute: 0 10*3/uL (ref 0–0.1)
Basophils Relative: 0 %
Eosinophils Absolute: 0.1 10*3/uL (ref 0–0.7)
Eosinophils Relative: 1 %
HEMATOCRIT: 41.7 % (ref 35.0–47.0)
Hemoglobin: 13.9 g/dL (ref 12.0–16.0)
LYMPHS ABS: 1.1 10*3/uL (ref 1.0–3.6)
Lymphocytes Relative: 11 %
MCH: 31.7 pg (ref 26.0–34.0)
MCHC: 33.4 g/dL (ref 32.0–36.0)
MCV: 94.7 fL (ref 80.0–100.0)
MONO ABS: 0.5 10*3/uL (ref 0.2–0.9)
MONOS PCT: 5 %
NEUTROS ABS: 8.8 10*3/uL — AB (ref 1.4–6.5)
Neutrophils Relative %: 83 %
Platelets: 192 10*3/uL (ref 150–440)
RBC: 4.4 MIL/uL (ref 3.80–5.20)
RDW: 15.1 % — AB (ref 11.5–14.5)
WBC: 10.6 10*3/uL (ref 3.6–11.0)

## 2017-11-24 LAB — URINE DRUG SCREEN, QUALITATIVE (ARMC ONLY)
Amphetamines, Ur Screen: NOT DETECTED
Benzodiazepine, Ur Scrn: NOT DETECTED
CANNABINOID 50 NG, UR ~~LOC~~: NOT DETECTED
COCAINE METABOLITE, UR ~~LOC~~: NOT DETECTED
MDMA (Ecstasy)Ur Screen: NOT DETECTED
Methadone Scn, Ur: NOT DETECTED
OPIATE, UR SCREEN: NOT DETECTED
PHENCYCLIDINE (PCP) UR S: NOT DETECTED
Tricyclic, Ur Screen: NOT DETECTED

## 2017-11-24 LAB — COMPREHENSIVE METABOLIC PANEL
ALBUMIN: 3.6 g/dL (ref 3.5–5.0)
ALK PHOS: 74 U/L (ref 38–126)
ALT: 20 U/L (ref 14–54)
ANION GAP: 7 (ref 5–15)
AST: 22 U/L (ref 15–41)
BUN: 17 mg/dL (ref 6–20)
CHLORIDE: 106 mmol/L (ref 101–111)
CO2: 27 mmol/L (ref 22–32)
Calcium: 9.3 mg/dL (ref 8.9–10.3)
Creatinine, Ser: 0.76 mg/dL (ref 0.44–1.00)
GFR calc Af Amer: >60 mL/min (ref >60)
GFR calc non Af Amer: >60 mL/min (ref >60)
GLUCOSE: 107 mg/dL — AB (ref 65–99)
POTASSIUM: 4.1 mmol/L (ref 3.5–5.1)
SODIUM: 140 mmol/L (ref 135–145)
Total Bilirubin: 0.4 mg/dL (ref 0.3–1.2)
Total Protein: 6.1 g/dL — ABNORMAL LOW (ref 6.5–8.1)

## 2017-11-24 LAB — ETHANOL: Alcohol, Ethyl (B): <10 mg/dL (ref <10)

## 2017-11-24 LAB — ACETAMINOPHEN LEVEL: Acetaminophen (Tylenol), Serum: <10 ug/mL — ABNORMAL LOW (ref 10–30)

## 2017-11-24 LAB — SALICYLATE LEVEL: Salicylate Lvl: <7 mg/dL (ref 2.8–30.0)

## 2017-11-24 MED ORDER — PANTOPRAZOLE SODIUM 40 MG PO TBEC
40.0000 mg | DELAYED_RELEASE_TABLET | Freq: Every day | ORAL | Status: DC
  Administered 2017-11-24 – 2017-11-25 (×2): 40 mg via ORAL
  Filled 2017-11-24 (×2): qty 1

## 2017-11-24 MED ORDER — OXYBUTYNIN CHLORIDE ER 10 MG PO TB24
10.0000 mg | ORAL_TABLET | Freq: Every day | ORAL | Status: DC
  Filled 2017-11-24 (×2): qty 1

## 2017-11-24 MED ORDER — RISPERIDONE 1 MG PO TABS
2.0000 mg | ORAL_TABLET | Freq: Every day | ORAL | Status: DC
  Administered 2017-11-24: 2 mg via ORAL
  Filled 2017-11-24: qty 2

## 2017-11-24 MED ORDER — HYDROXYZINE HCL 25 MG PO TABS
50.0000 mg | ORAL_TABLET | Freq: Once | ORAL | Status: AC
  Administered 2017-11-24: 50 mg via ORAL
  Filled 2017-11-24: qty 2

## 2017-11-24 MED ORDER — BENZTROPINE MESYLATE 1 MG PO TABS
0.5000 mg | ORAL_TABLET | Freq: Every day | ORAL | Status: DC
  Administered 2017-11-24: 0.5 mg via ORAL
  Filled 2017-11-24: qty 1

## 2017-11-24 MED ORDER — IPRATROPIUM-ALBUTEROL 0.5-2.5 (3) MG/3ML IN SOLN
3.0000 mL | Freq: Once | RESPIRATORY_TRACT | Status: AC
  Administered 2017-11-24: 3 mL via RESPIRATORY_TRACT
  Filled 2017-11-24: qty 3

## 2017-11-24 MED ORDER — LEVOFLOXACIN 500 MG PO TABS
500.0000 mg | ORAL_TABLET | Freq: Every day | ORAL | Status: DC
  Administered 2017-11-25: 500 mg via ORAL
  Filled 2017-11-24: qty 1

## 2017-11-24 MED ORDER — MONTELUKAST SODIUM 10 MG PO TABS
10.0000 mg | ORAL_TABLET | Freq: Every day | ORAL | Status: DC
  Administered 2017-11-24: 10 mg via ORAL
  Filled 2017-11-24 (×2): qty 1

## 2017-11-24 MED ORDER — GABAPENTIN 100 MG PO CAPS
100.0000 mg | ORAL_CAPSULE | Freq: Two times a day (BID) | ORAL | Status: DC
  Administered 2017-11-24 – 2017-11-25 (×2): 100 mg via ORAL
  Filled 2017-11-24 (×3): qty 1

## 2017-11-24 MED ORDER — LEVOFLOXACIN 500 MG PO TABS
500.0000 mg | ORAL_TABLET | Freq: Once | ORAL | Status: AC
  Administered 2017-11-24: 500 mg via ORAL
  Filled 2017-11-24: qty 1

## 2017-11-24 MED ORDER — LORATADINE 10 MG PO TABS
10.0000 mg | ORAL_TABLET | Freq: Every day | ORAL | Status: DC
  Administered 2017-11-24 – 2017-11-25 (×3): 10 mg via ORAL
  Filled 2017-11-24 (×2): qty 1

## 2017-11-24 MED ORDER — FLUTICASONE PROPIONATE 50 MCG/ACT NA SUSP
2.0000 | Freq: Every day | NASAL | Status: DC
  Administered 2017-11-24 – 2017-11-25 (×2): 2 via NASAL
  Filled 2017-11-24: qty 16

## 2017-11-24 MED ORDER — BENAZEPRIL HCL 5 MG PO TABS
5.0000 mg | ORAL_TABLET | Freq: Every day | ORAL | Status: DC
  Administered 2017-11-24 – 2017-11-25 (×2): 5 mg via ORAL
  Filled 2017-11-24 (×2): qty 1

## 2017-11-24 MED ORDER — PREDNISONE 20 MG PO TABS
60.0000 mg | ORAL_TABLET | Freq: Every day | ORAL | Status: DC
  Administered 2017-11-25: 60 mg via ORAL
  Filled 2017-11-24: qty 6

## 2017-11-24 MED ORDER — PREDNISONE 20 MG PO TABS
60.0000 mg | ORAL_TABLET | Freq: Once | ORAL | Status: AC
  Administered 2017-11-24: 60 mg via ORAL
  Filled 2017-11-24: qty 6

## 2017-11-24 MED ORDER — MOMETASONE FURO-FORMOTEROL FUM 100-5 MCG/ACT IN AERO
2.0000 | INHALATION_SPRAY | Freq: Two times a day (BID) | RESPIRATORY_TRACT | Status: DC
  Administered 2017-11-24 – 2017-11-25 (×2): 2 via RESPIRATORY_TRACT
  Filled 2017-11-24: qty 8.8

## 2017-11-24 MED ORDER — RISPERIDONE 0.5 MG PO TBDP
ORAL_TABLET | ORAL | Status: AC
  Administered 2017-11-24: 22:00:00 via ORAL
  Filled 2017-11-24: qty 1

## 2017-11-24 NOTE — BH Assessment (Signed)
Assessment Note  Vicki Morales is an 77 y.o. female who presents to the ER due to her daughter having concerns about the decline of her mental state. Per the daughter [name] Adan-[phone]), the patient's medications was change February 2019. She was taking the same medications since "1971." However, due to her insurance, she was unable to receive the medications. Patient was started on Seroquel and since then the she's start responding to internal stimuli. Daughter states, she has seen her outpatient provider and they have made ongoing changes and adjustments but with no improvements.  Patient is sleeping more, her speech is pressured, she's talking to herself and other people the daughter is unable to see. At times, when she is in conversation, she will say things that aren't related. When asked about it, she doesn't remember what she said.  Per the report of the patient, she's having racing thoughts and she believes people are reading her thoughts. Patient also states, she don't like her current state and wishes she can have something "to make it all go away." Based on patient and daughter's description, patient was doing well prior to the medication change. Patient was coherent and able to function with little to no assistance.  She was walking and performing her ADL's without any assistance. The last two days, she's been weak and tired. It's taking more time to complete tasks due to being weak and the inability to focus because of the hallucinations.   Patient last hospitalization took place over 20 years ago.  Diagnosis: Schizophrenia  Past Medical History:  Past Medical History:  Diagnosis Date  . Anxiety   . Arthritis   . Cataract    right eye  . COPD (chronic obstructive pulmonary disease) ([place])   . Depression   . Dyspnea    DOE  . Dysrhythmia   . Edema    FEET/ANKLES  . GERD (gastroesophageal reflux disease)   . H/O wheezing   . History of hiatal hernia   . HOH (hard of  hearing)   . Hypertension   . Incontinence of urine in female   . Orthopnea   . Pain    CHRONIC KNEE  . Pain    CHRONIC KNEE  . Paranoid disorder ([place])   . RLS (restless legs syndrome)   . Tremors of nervous system    HANDS    Past Surgical History:  Procedure Laterality Date  . ABDOMINAL HYSTERECTOMY  1988  . APPENDECTOMY  1988  . CARDIAC CATHETERIZATION    . CATARACT EXTRACTION W/PHACO Right 07/14/2016   Procedure: CATARACT EXTRACTION PHACO AND INTRAOCULAR LENS PLACEMENT ([place]) anterior vitrectomy;  Surgeon: Estill Cotta, MD;  Location: [facility] ORS;  Service: Ophthalmology;  Laterality: Right;  [place] 02:40 AP% 24.4 CDE 59.98 Fluid pack # [id number]  . CATARACT EXTRACTION W/PHACO Left 02/16/2017   Procedure: CATARACT EXTRACTION PHACO AND INTRAOCULAR LENS PLACEMENT (I[facility]);  Surgeon: Estill Cotta, MD;  Location: [facility] ORS;  Service: Ophthalmology;  Laterality: Left;  Lot # [id number] H [place]: 01:11.8 AP%: 23.5 CDE: 32.09   . ESOPHAGOGASTRODUODENOSCOPY N/A 11/11/2014   Procedure: ESOPHAGOGASTRODUODENOSCOPY (EGD);  Surgeon: Josefine Class, MD;  Location: [facility] ENDOSCOPY;  Service: Endoscopy;  Laterality: N/A;  . TONSILLECTOMY AND ADENOIDECTOMY    . TRIGGER FINGER RELEASE  2004    Family History:  Family History  Problem Relation Age of Onset  . Breast cancer Sister   . Stroke Father   . Emphysema Father   . Anxiety disorder Father   . Depression Father   .  Anxiety disorder Brother     Social History:  reports that she has quit smoking. She quit after 30.00 years of use. She has never used smokeless tobacco. She reports that she does not drink alcohol or use drugs.  Additional Social History:  Alcohol / Drug Use Pain Medications: See PTA Prescriptions: See PTA Over the Counter: See PTA History of alcohol / drug use?: No history of alcohol / drug abuse Longest period of sobriety (when/how long): Reports of none Negative Consequences of Use: (n/a) Withdrawal Symptoms:  (n/a)  CIWA: CIWA-Ar BP: (!) 142/66 Pulse Rate: (!) 111 COWS:    Allergies:  Allergies  Allergen Reactions  . Arthrotec [Diclofenac-Misoprostol] Hives  . Codeine Other (See Comments)    Headache   . Diclofenac Other (See Comments) and Hives    Pt unsure allergy  . Hydrochlorothiazide Other (See Comments)    Weakness   . Penicillins Hives    Has patient had a PCN reaction causing immediate rash, facial/tongue/throat swelling, SOB or lightheadedness with hypotension: No Has patient had a PCN reaction causing severe rash involving mucus membranes or skin necrosis: No Has patient had a PCN reaction that required hospitalization: No Has patient had a PCN reaction occurring within the last 10 years: No If all of the above answers are "NO", then may proceed with Cephalosporin use.  . Sulfa Antibiotics Hives  . Tiotropium Bromide Monohydrate Other (See Comments)    Blurry vision   . Tylenol [Acetaminophen] Other (See Comments)    Doesn't work  . Morphine Hives and Rash  . Pantoprazole Rash    Home Medications:  (Not in a hospital admission)  OB/GYN Status:  No LMP recorded. Patient is postmenopausal.  General Assessment Data Location of Assessment: [facility] ED TTS Assessment: In system Is this a Tele or Face-to-Face Assessment?: Face-to-Face Is this an Initial Assessment or a Re-assessment for this encounter?: Initial Assessment Marital status: Single Maiden name: n/a Is patient pregnant?: No Pregnancy Status: No Living Arrangements: Children(Live with daughter) Can pt return to current living arrangement?: Yes Admission Status: Voluntary Is patient capable of signing voluntary admission?: Yes Referral Source: Self/Family/Friend Insurance type: Medicare  Medical Screening Exam ([place]) Medical Exam completed: Yes  Crisis Care Plan Living Arrangements: Children(Live with daughter) Legal Guardian: Other:(Self) Name of Psychiatrist: Dr. Eappen([facility]) Name of Therapist: None  Education Status Is patient currently in school?: No Is the patient employed, unemployed or receiving disability?: Unemployed  Risk to self with the past 6 months Suicidal Ideation: No Has patient been a risk to self within the past 6 months prior to admission? : No Suicidal Intent: No Has patient had any suicidal intent within the past 6 months prior to admission? : No Is patient at risk for suicide?: No Suicidal Plan?: No Has patient had any suicidal plan within the past 6 months prior to admission? : No Access to Means: No What has been your use of drugs/alcohol within the last 12 months?: Reports of none Previous Attempts/Gestures: No How many times?: 0 Other Self Harm Risks: reports of none Triggers for Past Attempts: None known Intentional Self Injurious Behavior: None Family Suicide History: No Recent stressful life event(s): Other (Comment)(Increase A/H after medication change) Persecutory voices/beliefs?: Yes Depression: Yes Depression Symptoms: Isolating, Fatigue, Loss of interest in usual pleasures, Feeling worthless/self pity Substance abuse history and/or treatment for substance abuse?: No Suicide prevention information given to non-admitted patients: Not applicable  Risk to Others within the past 6 months Homicidal  Ideation: No Does patient have any lifetime risk of violence toward others beyond the six months prior to admission? : No Thoughts of Harm to Others: No Current Homicidal Intent: No Current Homicidal Plan: No Access to Homicidal Means: No Identified Victim: Reports of none History of harm to others?: No Assessment of Violence: None Noted Violent Behavior Description: Reports of none Does patient have access to weapons?: No Criminal Charges Pending?: No Does patient have a court date: No Is patient on probation?: No  Psychosis Hallucinations: Auditory, With command Delusions: None noted  Mental  Status Report Appearance/Hygiene: Unremarkable, In hospital gown Eye Contact: Fair Motor Activity: Unable to assess(Patient laying in the bed) Speech: Pressured Level of Consciousness: Alert Mood: Depressed, Sad, Pleasant Affect: Anxious, Sad Anxiety Level: Moderate Thought Processes: Relevant, Flight of Ideas Judgement: Partial Orientation: Person, Place, Time, Situation, Appropriate for developmental age Obsessive Compulsive Thoughts/Behaviors: Minimal  Cognitive Functioning Concentration: Decreased Memory: Recent Intact, Remote Intact Is patient IDD: No Is patient DD?: No Insight: Fair Impulse Control: Poor Appetite: Fair Have you had any weight changes? : Loss(Per Daughter) Amount of the weight change? (lbs): (Unknown) Sleep: Increased Total Hours of Sleep: 10 Vegetative Symptoms: None  ADLScreening [facility] Assessment Services) Patient's cognitive ability adequate to safely complete daily activities?: Yes Patient able to express need for assistance with ADLs?: Yes Independently performs ADLs?: Yes (appropriate for developmental age)  Prior Inpatient Therapy Prior Inpatient Therapy: Yes Prior Therapy Dates: Over 20 years ago Prior Therapy Facilty/Provider(s): [facility] Reason for Treatment: Psychosis  Prior Outpatient Therapy Prior Outpatient Therapy: Yes Prior Therapy Dates: Current Prior Therapy Facilty/Provider(s): [place], Private office of Dr. Bridgett Larsson Reason for Treatment: Schizophrenia Does patient have an ACCT team?: No Does patient have [facility] services? : No Does patient have P4CC services?: No  ADL Screening (condition at time of admission) Patient's cognitive ability adequate to safely complete daily activities?: Yes Is the patient deaf or have difficulty hearing?: No Does the patient have difficulty seeing, even when wearing glasses/contacts?: No Does the patient have difficulty concentrating, remembering, or making  decisions?: No Patient able to express need for assistance with ADLs?: Yes Does the patient have difficulty dressing or bathing?: No Independently performs ADLs?: Yes (appropriate for developmental age) Does the patient have difficulty walking or climbing stairs?: Yes Weakness of Legs: Both Weakness of Arms/Hands: None  Home Assistive Devices/Equipment Home Assistive Devices/Equipment: Environmental consultant (specify type)  Therapy Consults (therapy consults require a physician order) PT Evaluation Needed: No OT Evalulation Needed: No SLP Evaluation Needed: No Abuse/Neglect Assessment (Assessment to be complete while patient is alone) Abuse/Neglect Assessment Can Be Completed: Yes Physical Abuse: Denies Verbal Abuse: Denies Sexual Abuse: Denies Exploitation of patient/patient's resources: Denies Self-Neglect: Denies Values / Beliefs Cultural Requests During Hospitalization: None Spiritual Requests During Hospitalization: None Consults Spiritual Care Consult Needed: No Social Work Consult Needed: No         Child/Adolescent Assessment Running Away Risk: Denies(Patient is an adult)  Disposition:  Disposition Initial Assessment Completed for this Encounter: Yes  On Site Evaluation by:   Reviewed with Physician:    Gunnar Fusi MS, LCAS, LPC, [place], CCSI Therapeutic Triage Specialist 11/24/2017 7:56 PM

## 2017-11-24 NOTE — Consult Note (Signed)
[facility] Face-to-Face Psychiatry Consult   Reason for Consult: Consult for 77 year old woman with a history of schizophrenia brought to the hospital by her daughter for exacerbation Referring Physician: Rifenbark Patient Identification: Vicki Morales MRN:  [id number] Principal Diagnosis: Schizophrenia, undifferentiated ([place]) Diagnosis:   Patient Active Problem List   Diagnosis Date Noted  . 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 COPD with acute exacerbation ([place]) [J44.1] 10/24/2017  . Nocturnal hypoxia [G47.34] 08/21/2016  . Restless leg syndrome [G25.81] 08/20/2016  . Impingement syndrome of shoulder region [M75.40] 07/29/2016  . Hyponatremia [E87.1] 03/21/2016  . Pneumonia [J18.9] 03/18/2016  . History of suicide attempt [Z91.5] 03/16/2016  . Overdose [T50.901A] 03/16/2016  . Pulmonary hypertension ([place]) [I27.20] 11/11/2015  . Anxiety [F41.9] 09/22/2015  . Depression [F32.9] 09/22/2015  . Osteoarthritis [M19.90] 09/10/2015  . Rosacea [L71.9] 07/03/2015  . Breast pain [N64.4] 11/07/2014  . Callus of foot [L84] 11/07/2014  . CN (constipation) [K59.00] 11/07/2014  . Dermatitis, eczematoid [L30.9] 11/07/2014  . Diverticulosis of colon [K57.30] 11/07/2014  . Dizziness [R42] 11/07/2014  . Can't get food down [IMO0001] 11/07/2014  . Accumulation of fluid in tissues [R60.9] 11/07/2014  . Fatigue [R53.83] 11/07/2014  . FOM (frequency of micturition) [R35.0] 11/07/2014  . Tension type headache [G44.209] 11/07/2014  . LBP (low back pain) [M54.5] 11/07/2014  . Episodic mood disorder ([place]) [F39] 11/07/2014  . Extreme obesity [E66.8] 11/07/2014  . Muscle ache [M79.10] 11/07/2014  . Disturbance of skin sensation [R20.9] 11/07/2014  . Awareness of heartbeats [R00.2] 11/07/2014  . Jerking [R25.3] 11/07/2014  . Body tinea [B35.4] 11/07/2014  . Essential (primary) hypertension [I10] 10/10/2014  . Moderate COPD (chronic obstructive pulmonary disease) ([place]) [J44.9]  04/01/2014  . Hx of obesity [Z86.39] 04/01/2014  . CAFL (chronic airflow limitation) ([place]) [J44.9] 01/25/2009  . Malaise and fatigue [R53.81, R53.83] 11/26/2008  . Aphasia [R47.01] 09/26/2008  . 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 GERD (gastroesophageal reflux disease) [K21.9] 06/07/1998  . HLD (hyperlipidemia) [E78.5] 06/07/1998  . OP (osteoporosis) [M81.0] 06/07/1998  . H/O total hysterectomy [Z90.710] 03/08/1987  . Schizophrenia, in remission ([place]) [F20.9] 06/07/1978    Total Time spent with patient: 1 hour  Subjective:   Vicki Morales is a 77 y.o. female patient admitted with "I am nervous".  HPI: Patient seen chart reviewed.  Patient's daughter was present and was able to give most of the history.  Patient indicates she agrees with it.  77 year old woman with a long-standing history of schizophrenia.  Back in February her antipsychotic medication was changed because they were no longer able to find a drug store that could supply his Stelazine.  Effort since then has been to try to get her improved on Seroquel but the patient's symptoms have been getting worse.  Daughter reports that ever since then the patient has "not been herself".  She seems confused much of the time.  Talks to herself a lot.  Does not engage in normal activities.  Seems depressed and anxious.  Sleep patterns are erratic.  Patient has not actually threatened to hurt her self but has expressed a lot of despair and has talked about taking an overdose to make herself feel better.  Currently she is taking 100 mg of Seroquel if she takes it at all but has had trouble tolerating it.  No specific new stresses.  Social history: Patient lives with her daughter.  Daughter is concerned because she works during the daytime leaving the patient alone at home.  Medical  history: Patient is overweight has COPD has multiple other chronic medical problems but right now she is most remarkably short of  breath and requiring breathing treatments.  Substance abuse history: No history of alcohol or drug abuse  Past Psychiatric History: Patient was diagnosed with schizophrenia in 1971 and has been on antipsychotic medicine ever since then.  Decades ago she had a couple of hospitalizations but has not had a psychiatric hospitalization in years.  For about 40 years she was maintained consistently on Stelazine 10 mg a day however this medicine, like many of the other typical antipsychotics, has been in short supply recently.  Her outpatient psychiatrist has been trying to change her over to Seroquel but the patient has had a difficult time tolerating it.  No recent suicide attempts.  Possibly some overdose attempts many years ago.  Risk to Self:   Risk to Others:   Prior Inpatient Therapy:   Prior Outpatient Therapy:    Past Medical History:  Past Medical History:  Diagnosis Date  . Anxiety   . Arthritis   . Cataract    right eye  . COPD (chronic obstructive pulmonary disease) ([place])   . Depression   . Dyspnea    DOE  . Dysrhythmia   . Edema    FEET/ANKLES  . GERD (gastroesophageal reflux disease)   . H/O wheezing   . History of hiatal hernia   . HOH (hard of hearing)   . Hypertension   . Incontinence of urine in female   . Orthopnea   . Pain    CHRONIC KNEE  . Pain    CHRONIC KNEE  . Paranoid disorder ([place])   . RLS (restless legs syndrome)   . Tremors of nervous system    HANDS    Past Surgical History:  Procedure Laterality Date  . ABDOMINAL HYSTERECTOMY  1988  . APPENDECTOMY  1988  . CARDIAC CATHETERIZATION    . CATARACT EXTRACTION W/PHACO Right 07/14/2016   Procedure: CATARACT EXTRACTION PHACO AND INTRAOCULAR LENS PLACEMENT ([place]) anterior vitrectomy;  Surgeon: Estill Cotta, MD;  Location: [facility] ORS;  Service: Ophthalmology;  Laterality: Right;  [place] 02:40 AP% 24.4 CDE 59.98 Fluid pack # [id number]  . CATARACT EXTRACTION W/PHACO Left 02/16/2017   Procedure: CATARACT  EXTRACTION PHACO AND INTRAOCULAR LENS PLACEMENT (IOC);  Surgeon: Estill Cotta, MD;  Location: [facility] ORS;  Service: Ophthalmology;  Laterality: Left;  Lot # [id number] H [place]: 01:11.8 AP%: 23.5 CDE: 32.09   . ESOPHAGOGASTRODUODENOSCOPY N/A 11/11/2014   Procedure: ESOPHAGOGASTRODUODENOSCOPY (EGD);  Surgeon: Josefine Class, MD;  Location: [facility] ENDOSCOPY;  Service: Endoscopy;  Laterality: N/A;  . TONSILLECTOMY AND ADENOIDECTOMY    . TRIGGER FINGER RELEASE  2004   Family History:  Family History  Problem Relation Age of Onset  . Breast cancer Sister   . Stroke Father   . Emphysema Father   . Anxiety disorder Father   . Depression Father   . Anxiety disorder Brother    Family Psychiatric  History: None known Social History:  Social History   Substance and Sexual Activity  Alcohol Use No  . Alcohol/week: 0.0 oz     Social History   Substance and Sexual Activity  Drug Use No    Social History   Socioeconomic History  . Marital status: Widowed    Spouse name: Not on file  . Number of children: 2  . Years of education: Not on file  . Highest education level: 12th grade  Occupational History  . 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 Financial resource strain: Not hard at all  . Food insecurity:    Worry: Never true    Inability: Never true  . Transportation needs:    Medical: No    Non-medical: No  Tobacco Use  . Smoking status: Former Smoker    Years: 30.00  . Smokeless tobacco: Never Used  . Tobacco comment: quit in 1989  Substance and Sexual Activity  . Alcohol use: No    Alcohol/week: 0.0 oz  . Drug use: No  . Sexual activity: Not Currently  Lifestyle  . Physical activity:    Days per week: 0 days    Minutes per session: 0 min  . Stress: Only a little  Relationships  . Social connections:    Talks on phone: Not on file    Gets together: Not on file    Attends religious service: Not on file    Active member of club or organization: Not on  file    Attends meetings of clubs or organizations: Not on file    Relationship status: Not on file  Other Topics Concern  . Not on file  Social History Narrative  . Not on file   Additional Social History:    Allergies:   Allergies  Allergen Reactions  . Arthrotec [Diclofenac-Misoprostol] Hives  . Codeine Other (See Comments)    Headache   . Diclofenac Other (See Comments) and Hives    Pt unsure allergy  . Hydrochlorothiazide Other (See Comments)    Weakness   . Penicillins Hives    Has patient had a PCN reaction causing immediate rash, facial/tongue/throat swelling, SOB or lightheadedness with hypotension: No Has patient had a PCN reaction causing severe rash involving mucus membranes or skin necrosis: No Has patient had a PCN reaction that required hospitalization: No Has patient had a PCN reaction occurring within the last 10 years: No If all of the above answers are "NO", then may proceed with Cephalosporin use.  . Sulfa Antibiotics Hives  . Tiotropium Bromide Monohydrate Other (See Comments)    Blurry vision   . Tylenol [Acetaminophen] Other (See Comments)    Doesn't work  . Morphine Hives and Rash  . Pantoprazole Rash    Labs:  Results for orders placed or performed during the hospital encounter of 11/24/17 (from the past 48 hour(s))  Acetaminophen level     Status: Abnormal   Collection Time: 11/24/17  4:54 PM  Result Value Ref Range   Acetaminophen (Tylenol), Serum <10 (L) 10 - 30 ug/mL    Comment: (NOTE) Therapeutic concentrations vary significantly. A range of 10-30 ug/mL  may be an effective concentration for many patients. However, some  are best treated at concentrations outside of this range. Acetaminophen concentrations >150 ug/mL at 4 hours after ingestion  and >50 ug/mL at 12 hours after ingestion are often associated with  toxic reactions. Performed at [facility], [address] [place], [place] [address]   Ethanol     Status: None    Collection Time: 11/24/17  4:54 PM  Result Value Ref Range   Alcohol, Ethyl (B) <10 <10 mg/dL    Comment: (NOTE) Lowest detectable limit for serum alcohol is 10 mg/dL. For medical purposes only. Performed at [facility], [address], [place], [place] [address]   Comprehensive metabolic panel     Status: Abnormal   Collection Time: 11/24/17  4:54 PM  Result Value Ref Range  Sodium 140 135 - 145 mmol/L   Potassium 4.1 3.5 - 5.1 mmol/L   Chloride 106 101 - 111 mmol/L   CO2 27 22 - 32 mmol/L   Glucose, Bld 107 (H) 65 - 99 mg/dL   BUN 17 6 - 20 mg/dL   Creatinine, Ser 0.76 0.44 - 1.00 mg/dL   Calcium 9.3 8.9 - 10.3 mg/dL   Total Protein 6.1 (L) 6.5 - 8.1 g/dL   Albumin 3.6 3.5 - 5.0 g/dL   AST 22 15 - 41 U/L   ALT 20 14 - 54 U/L   Alkaline Phosphatase 74 38 - 126 U/L   Total Bilirubin 0.4 0.3 - 1.2 mg/dL   GFR calc non Af Amer >60 >60 mL/min   GFR calc Af Amer >60 >60 mL/min    Comment: (NOTE) The eGFR has been calculated using the CKD EPI equation. This calculation has not been validated in all clinical situations. eGFR's persistently <60 mL/min signify possible Chronic Kidney Disease.    Anion gap 7 5 - 15    Comment: Performed at [facility], [address] [place], [place] [address]  CBC with Differential     Status: Abnormal   Collection Time: 11/24/17  4:54 PM  Result Value Ref Range   WBC 10.6 3.6 - 11.0 K/uL   RBC 4.40 3.80 - 5.20 MIL/uL   Hemoglobin 13.9 12.0 - 16.0 g/dL   HCT 41.7 35.0 - 47.0 %   MCV 94.7 80.0 - 100.0 fL   MCH 31.7 26.0 - 34.0 pg   MCHC 33.4 32.0 - 36.0 g/dL   RDW 15.1 (H) 11.5 - 14.5 %   Platelets 192 150 - 440 K/uL   Neutrophils Relative % 83 %   Neutro Abs 8.8 (H) 1.4 - 6.5 K/uL   Lymphocytes Relative 11 %   Lymphs Abs 1.1 1.0 - 3.6 K/uL   Monocytes Relative 5 %   Monocytes Absolute 0.5 0.2 - 0.9 K/uL   Eosinophils Relative 1 %   Eosinophils Absolute 0.1 0 - 0.7 K/uL   Basophils Relative 0 %   Basophils  Absolute 0.0 0 - 0.1 K/uL    Comment: Performed at [facility], [address] [place], [place] [address]  Salicylate level     Status: None   Collection Time: 11/24/17  4:54 PM  Result Value Ref Range   Salicylate Lvl <5.8 2.8 - 30.0 mg/dL    Comment: Performed at [facility], [address] [place], [place] [address]  Urine Drug Screen, Qualitative     Status: Abnormal   Collection Time: 11/24/17  4:54 PM  Result Value Ref Range   Tricyclic, Ur Screen NONE DETECTED NONE DETECTED   Amphetamines, Ur Screen NONE DETECTED NONE DETECTED   MDMA (Ecstasy)Ur Screen NONE DETECTED NONE DETECTED   Cocaine Metabolite,Ur [place] NONE DETECTED NONE DETECTED   Opiate, Ur Screen NONE DETECTED NONE DETECTED   Phencyclidine (PCP) Ur S NONE DETECTED NONE DETECTED   Cannabinoid 50 Ng, Ur [place] NONE DETECTED NONE DETECTED   Barbiturates, Ur Screen (A) NONE DETECTED    Result not available. Reagent lot number recalled by manufacturer.   Benzodiazepine, Ur Scrn NONE DETECTED NONE DETECTED   Methadone Scn, Ur NONE DETECTED NONE DETECTED    Comment: (NOTE) Tricyclics + metabolites, urine    Cutoff 1000 ng/mL Amphetamines + metabolites, urine  Cutoff 1000 ng/mL MDMA (Ecstasy), urine              Cutoff  500 ng/mL Cocaine Metabolite, urine          Cutoff 300 ng/mL Opiate + metabolites, urine        Cutoff 300 ng/mL Phencyclidine (PCP), urine         Cutoff 25 ng/mL Cannabinoid, urine                 Cutoff 50 ng/mL Barbiturates + metabolites, urine  Cutoff 200 ng/mL Benzodiazepine, urine              Cutoff 200 ng/mL Methadone, urine                   Cutoff 300 ng/mL The urine drug screen provides only a preliminary, unconfirmed analytical test result and should not be used for non-medical purposes. Clinical consideration and professional judgment should be applied to any positive drug screen result due to possible interfering substances. A more specific alternate chemical method must be  used in order to obtain a confirmed analytical result. Gas chromatography / mass spectrometry (GC/MS) is the preferred confirmat ory method. Performed at [facility], [address], [place], [place] [address]     Current Facility-Administered Medications  Medication Dose Route Frequency Provider Last Rate Last Dose  . benazepril (LOTENSIN) tablet 5 mg  5 mg Oral Daily Clapacs, John T, MD      . benztropine (COGENTIN) tablet 0.5 mg  0.5 mg Oral QHS Clapacs, John T, MD      . fluticasone (FLONASE) 50 MCG/ACT nasal spray 2 spray  2 spray Each Nare Daily Clapacs, John T, MD      . gabapentin (NEURONTIN) capsule 100 mg  100 mg Oral BID Clapacs, Madie Reno, MD      . Derrill Memo ON 11/25/2017] levofloxacin (LEVAQUIN) tablet 500 mg  500 mg Oral Daily Rifenbark, Milta Deiters, MD      . loratadine (CLARITIN) tablet 10 mg  10 mg Oral Daily Clapacs, John T, MD      . mometasone-formoterol (DULERA) 100-5 MCG/ACT inhaler 2 puff  2 puff Inhalation BID Clapacs, John T, MD      . montelukast (SINGULAIR) tablet 10 mg  10 mg Oral QHS Clapacs, John T, MD      . oxybutynin (DITROPAN-XL) 24 hr tablet 10 mg  10 mg Oral QHS Clapacs, John T, MD      . pantoprazole (PROTONIX) EC tablet 40 mg  40 mg Oral Daily Clapacs, John T, MD      . Derrill Memo ON 11/25/2017] predniSONE (DELTASONE) tablet 60 mg  60 mg Oral Q breakfast Rifenbark, Milta Deiters, MD      . risperiDONE (RISPERDAL) tablet 2 mg  2 mg Oral QHS Clapacs, Madie Reno, MD       Current Outpatient Medications  Medication Sig Dispense Refill  . albuterol (PROVENTIL) (2.5 MG/3ML) 0.083% nebulizer solution Take 3 mLs (2.5 mg total) by nebulization 2 (two) times daily as needed for wheezing or shortness of breath. 75 mL 5  . aspirin EC 325 MG tablet Take 1 tablet (325 mg total) by mouth daily. (Patient taking differently: Take 325 mg by mouth at bedtime. ) 30 tablet 2  . benazepril (LOTENSIN) 5 MG tablet TAKE 1 TABLET(5 MG) BY MOUTH DAILY 30 tablet 11  . busPIRone (BUSPAR) 10 MG  tablet Take 1 tablet (10 mg total) by mouth 3 (three) times daily. 270 tablet 1  . Calcium Carbonate-Vitamin D (CALCIUM 600+D) 600-400 MG-UNIT tablet Take 1 tablet by mouth 2 (two) times daily.     [name] Kitchen  Cranberry 250 MG CAPS Take 1 capsule by mouth daily.     . fluticasone (FLONASE) 50 MCG/ACT nasal spray Place 1 spray into both nostrils daily.    . Fluticasone-Umeclidin-Vilant (TRELEGY ELLIPTA) 100-62.5-25 MCG/INH AEPB Inhale 1 puff into the lungs daily. 1 each 2  . gabapentin (NEURONTIN) 100 MG capsule TAKE 1 CAPSULE(100 MG) BY MOUTH TWICE DAILY 60 capsule 11  . gabapentin (NEURONTIN) 300 MG capsule TAKE 2 CAPSULES(600 MG) BY MOUTH AT BEDTIME 60 capsule 11  . hydrOXYzine (ATARAX/VISTARIL) 25 MG tablet TAKE 1 TABLET(25 MG) BY MOUTH EVERY 8 HOURS AS NEEDED FOR ANXIETY 60 tablet 5  . loratadine (CLARITIN) 10 MG tablet Take 10 mg by mouth daily.    . magnesium oxide (MAG-OX) 400 MG tablet Take 400 mg by mouth 2 (two) times daily.    . Melatonin 10 MG CAPS Take 10 mg by mouth at bedtime as needed.    . meloxicam (MOBIC) 15 MG tablet TAKE 1 TABLET BY MOUTH EVERY DAY WITH A MEALS    . Misc Natural Products (OSTEO BI-FLEX ADV DOUBLE ST PO) Take 1 tablet by mouth 2 (two) times daily.     . montelukast (SINGULAIR) 10 MG tablet TAKE 1 TABLET BY MOUTH EVERY DAY 30 tablet 12  . Multiple Vitamin (MULTIVITAMIN WITH MINERALS) TABS tablet Take 1 tablet by mouth daily.    . Omega-3 Fatty Acids (FISH OIL) 1000 MG CAPS Take 1,000 mg by mouth daily.     [name] Kitchen omeprazole (PRILOSEC) 20 MG capsule Take 20 mg by mouth daily before breakfast.     . oxybutynin (DITROPAN-XL) 10 MG 24 hr tablet TAKE 1 TABLET BY MOUTH AT BEDTIME 30 tablet 5  . Probiotic Product (ALIGN) 4 MG CAPS Take 4 mg by mouth daily.     . QUEtiapine (SEROQUEL) 25 MG tablet Take 1 tablet (25 mg total) by mouth at bedtime as needed. For sleep 30 tablet 2  . QUEtiapine (SEROQUEL) 50 MG tablet Take 1.5 tablets (75 mg total) by mouth at bedtime. 135 tablet 1  .  ranitidine (ZANTAC) 150 MG tablet Take 150 mg by mouth at bedtime.    . sertraline (ZOLOFT) 50 MG tablet Take 1 tablet (50 mg total) by mouth at bedtime. 90 tablet 1  . theophylline (UNIPHYL) 400 MG 24 hr tablet TAKE 1 TABLET BY MOUTH DAILY. 30 tablet 12  . tiZANidine (ZANAFLEX) 4 MG tablet Take 1 tablet (4 mg total) by mouth every 8 (eight) hours as needed for muscle spasms. 30 tablet 5  . traMADol (ULTRAM) 50 MG tablet Take 2 tablets (100 mg total) by mouth 2 (two) times daily. 60 tablet 3  . traZODone (DESYREL) 50 MG tablet Take 0.5-1 tablets (25-50 mg total) by mouth at bedtime as needed for sleep. 90 tablet 1    Musculoskeletal: Strength & Muscle Tone: decreased Gait & Station: unsteady Patient leans: N/A  Psychiatric Specialty Exam: Physical Exam  Nursing note and vitals reviewed. Constitutional: She appears well-developed and well-nourished.  HENT:  Head: Normocephalic and atraumatic.  Eyes: Pupils are equal, round, and reactive to light. Conjunctivae are normal.  Neck: Normal range of motion.  Cardiovascular: Regular rhythm and normal heart sounds.  Respiratory: She is in respiratory distress. She has wheezes.  GI: Soft.  Musculoskeletal: Normal range of motion.  Neurological: She is alert.  Skin: Skin is warm and dry.  Psychiatric: Her affect is blunt. Her speech is delayed and tangential. She is agitated. She is not aggressive. Thought content is  paranoid. Cognition and memory are impaired. She expresses impulsivity. She expresses suicidal ideation. She expresses no suicidal plans.    Review of Systems  Constitutional: Negative.   HENT: Negative.   Eyes: Negative.   Respiratory: Positive for shortness of breath.   Cardiovascular: Negative.   Gastrointestinal: Negative.   Musculoskeletal: Negative.   Skin: Negative.   Neurological: Negative.   Psychiatric/Behavioral: Positive for depression, hallucinations, memory loss and suicidal ideas. Negative for substance abuse.  The patient is nervous/anxious and has insomnia.     Blood pressure (!) 142/66, pulse (!) 111, temperature 98.5 F (36.9 C), temperature source Oral, resp. rate (!) 21, height '5\' 5"'  (1.651 m), weight 114.8 kg (253 lb), SpO2 93 %.Body mass index is 42.1 kg/m.  General Appearance: Disheveled  Eye Contact:  Minimal  Speech:  Slow  Volume:  Decreased  Mood:  Anxious and Dysphoric  Affect:  Blunt  Thought Process:  Disorganized  Orientation:  Full (Time, Place, and Person)  Thought Content:  Illogical, Paranoid Ideation and Rumination  Suicidal Thoughts:  Yes.  without intent/plan  Homicidal Thoughts:  No  Memory:  Immediate;   Fair Recent;   Poor Remote;   Fair  Judgement:  Fair  Insight:  Fair  Psychomotor Activity:  Decreased  Concentration:  Concentration: Fair  Recall:  [employer] of Knowledge:  Fair  Language:  Fair  Akathisia:  No  Handed:  Right  AIMS (if indicated):     Assets:  Desire for Improvement Financial Resources/Insurance Housing Resilience Social Support  ADL's:  Intact  Cognition:  Impaired,  Mild  Sleep:        Treatment Plan Summary: Daily contact with patient to assess and evaluate symptoms and progress in treatment, Medication management and Plan This is a 77 year old woman with schizophrenia who has been decompensating for the last few months after a change in medication.  Although she has had some vague suicidal like thoughts she is not threatening to kill herself and has no wish to die and does not appear to be at high suicide risk.  Nonetheless her psychotic symptoms are getting worse and both she and the daughter are concerned about how she is doing at home.  They do not feel comfortable necessarily with her coming home in her current condition.  We discussed treatment options and I suggested that we try risperidone as what I think is probably the closest alternative to a typical antipsychotic they agree to the plan.  Discontinue quetiapine begin  Risperdal 2 mg at night along with 1/2 mg of Cogentin.  Continue other medicines mostly her inhalers and blood pressure medicines as best we can.  We will try to refer her to a geriatric psychiatry unit.  If a bed becomes available she would be a good candidate for inpatient stabilization but we will continue to assess while she is in the ER as well.  Case reviewed with TTS and emergency room physician..  Disposition: Recommend psychiatric Inpatient admission when medically cleared. Supportive therapy provided about ongoing stressors.  Alethia Berthold, MD 11/24/2017 7:24 PM

## 2017-11-24 NOTE — BH Assessment (Signed)
Daughter request to be updated about patient's status and or any changes. [name] Barreira-[phone]                          [phone] work

## 2017-11-24 NOTE — BH Assessment (Signed)
Referral information for Psychiatric Hospitalization faxed to;   [name] [phone]),   . [facility] [phone]),  . [facility] [phone])  . [facility] [phone] or [phone]),   . [facility] [phone]).

## 2017-11-24 NOTE — ED Triage Notes (Signed)
Pt comes into the ED via EMS from home c/o increased paranoia and hallucinations.  Patient was switched to Seroquel in February and she states that since then she has continually had the verbal and auditory hallucinations.  Denies any SI or HI.  Patient calm and cooperative at this time and in NAD>

## 2017-11-24 NOTE — ED Notes (Signed)
PT VOLUNTARY PENDING PSYCH CONSULT. 

## 2017-11-24 NOTE — ED Provider Notes (Signed)
[facility] Emergency Department Provider Note  ____________________________________________   First MD Initiated Contact with Patient 11/24/17 1646     (approximate)  I have reviewed the triage vital signs and the nursing notes.   HISTORY  Chief Complaint Psychiatric Evaluation   HPI Vicki Morales is a 77 y.o. female who comes to the emergency department via EMS with depression auditory and visual hallucinations.  She has a long-standing history of paranoid schizophrenia and has been taking antipsychotics since the 1970s.  Her old antipsychotic is no longer being manufactured and the patient was relatively recently switched to Seroquel which has not adequately controlled her symptoms.  She has a long-standing history of COPD and does report some shortness of breath and increasingly productive cough recently.  She reports generalized malaise and difficulty functioning at home.  She is somewhat suicidal although her symptoms are vague and she does not have a clear plan.  Her depression has been insidious onset slowly progressive is worsened by interpersonal conflict and somewhat improved with rest.  Past Medical History:  Diagnosis Date  . Anxiety   . Arthritis   . Cataract    right eye  . COPD (chronic obstructive pulmonary disease) ([place])   . Depression   . Dyspnea    DOE  . Dysrhythmia   . Edema    FEET/ANKLES  . GERD (gastroesophageal reflux disease)   . H/O wheezing   . History of hiatal hernia   . HOH (hard of hearing)   . Hypertension   . Incontinence of urine in female   . Orthopnea   . Pain    CHRONIC KNEE  . Pain    CHRONIC KNEE  . Paranoid disorder ([place])   . RLS (restless legs syndrome)   . Tremors of nervous system    HANDS    Patient Active Problem List   Diagnosis Date Noted  . Schizophrenia, undifferentiated ([place]) 11/24/2017  . COPD with acute exacerbation ([place]) 10/24/2017  . Nocturnal hypoxia 08/21/2016  . Restless leg  syndrome 08/20/2016  . Impingement syndrome of shoulder region 07/29/2016  . Hyponatremia 03/21/2016  . Pneumonia 03/18/2016  . History of suicide attempt 03/16/2016  . Overdose 03/16/2016  . Pulmonary hypertension ([place]) 11/11/2015  . Anxiety 09/22/2015  . Depression 09/22/2015  . Osteoarthritis 09/10/2015  . Rosacea 07/03/2015  . Breast pain 11/07/2014  . Callus of foot 11/07/2014  . CN (constipation) 11/07/2014  . Dermatitis, eczematoid 11/07/2014  . Diverticulosis of colon 11/07/2014  . Dizziness 11/07/2014  . Can't get food down 11/07/2014  . Accumulation of fluid in tissues 11/07/2014  . Fatigue 11/07/2014  . FOM (frequency of micturition) 11/07/2014  . Tension type headache 11/07/2014  . LBP (low back pain) 11/07/2014  . Episodic mood disorder ([place]) 11/07/2014  . Extreme obesity 11/07/2014  . Muscle ache 11/07/2014  . Disturbance of skin sensation 11/07/2014  . Awareness of heartbeats 11/07/2014  . Jerking 11/07/2014  . Body tinea 11/07/2014  . Essential (primary) hypertension 10/10/2014  . Moderate COPD (chronic obstructive pulmonary disease) ([place]) 04/01/2014  . Hx of obesity 04/01/2014  . CAFL (chronic airflow limitation) ([place]) 01/25/2009  . Malaise and fatigue 11/26/2008  . Aphasia 09/26/2008  . 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 GERD (gastroesophageal reflux disease) 06/07/1998  . HLD (hyperlipidemia) 06/07/1998  . OP (osteoporosis) 06/07/1998  . H/O total hysterectomy 03/08/1987  . Schizophrenia, in remission ([place]) 06/07/1978    Past Surgical History:  Procedure Laterality Date  .  ABDOMINAL HYSTERECTOMY  1988  . APPENDECTOMY  1988  . CARDIAC CATHETERIZATION    . CATARACT EXTRACTION W/PHACO Right 07/14/2016   Procedure: CATARACT EXTRACTION PHACO AND INTRAOCULAR LENS PLACEMENT ([place]) anterior vitrectomy;  Surgeon: Estill Cotta, MD;  Location: [facility] ORS;  Service: Ophthalmology;  Laterality: Right;  [place] 02:40 AP% 24.4 CDE  59.98 Fluid pack # [id number]  . CATARACT EXTRACTION W/PHACO Left 02/16/2017   Procedure: CATARACT EXTRACTION PHACO AND INTRAOCULAR LENS PLACEMENT (IOC);  Surgeon: Estill Cotta, MD;  Location: [facility] ORS;  Service: Ophthalmology;  Laterality: Left;  Lot # [id number] H [place]: 01:11.8 AP%: 23.5 CDE: 32.09   . ESOPHAGOGASTRODUODENOSCOPY N/A 11/11/2014   Procedure: ESOPHAGOGASTRODUODENOSCOPY (EGD);  Surgeon: Josefine Class, MD;  Location: [facility] ENDOSCOPY;  Service: Endoscopy;  Laterality: N/A;  . TONSILLECTOMY AND ADENOIDECTOMY    . TRIGGER FINGER RELEASE  2004    Prior to Admission medications   Medication Sig Start Date End Date Taking? Authorizing Provider  albuterol (PROVENTIL) (2.5 MG/3ML) 0.083% nebulizer solution Take 3 mLs (2.5 mg total) by nebulization 2 (two) times daily as needed for wheezing or shortness of breath. 09/22/16   Birdie Sons, MD  aspirin EC 325 MG tablet Take 1 tablet (325 mg total) by mouth daily. Patient taking differently: Take 325 mg by mouth at bedtime.  09/23/15   Demetrios Loll, MD  benazepril (LOTENSIN) 5 MG tablet TAKE 1 TABLET(5 MG) BY MOUTH DAILY 11/20/17   Birdie Sons, MD  busPIRone (BUSPAR) 10 MG tablet Take 1 tablet (10 mg total) by mouth 3 (three) times daily. 11/08/17   Ursula Alert, MD  Calcium Carbonate-Vitamin D (CALCIUM 600+D) 600-400 MG-UNIT tablet Take 1 tablet by mouth 2 (two) times daily.     [provider]  Cranberry 250 MG CAPS Take 1 capsule by mouth daily.     [provider]  fluticasone (FLONASE) 50 MCG/ACT nasal spray Place 1 spray into both nostrils daily.    [provider]  Fluticasone-Umeclidin-Vilant (TRELEGY ELLIPTA) 100-62.5-25 MCG/INH AEPB Inhale 1 puff into the lungs daily. 11/08/17   Birdie Sons, MD  gabapentin (NEURONTIN) 100 MG capsule TAKE 1 CAPSULE(100 MG) BY MOUTH TWICE DAILY 04/26/17   Birdie Sons, MD  gabapentin (NEURONTIN) 300 MG capsule TAKE 2 CAPSULES(600 MG) BY MOUTH AT BEDTIME  04/26/17   Birdie Sons, MD  hydrOXYzine (ATARAX/VISTARIL) 25 MG tablet TAKE 1 TABLET(25 MG) BY MOUTH EVERY 8 HOURS AS NEEDED FOR ANXIETY 05/08/17   Birdie Sons, MD  loratadine (CLARITIN) 10 MG tablet Take 10 mg by mouth daily.    [provider]  magnesium oxide (MAG-OX) 400 MG tablet Take 400 mg by mouth 2 (two) times daily.    [provider]  Melatonin 10 MG CAPS Take 10 mg by mouth at bedtime as needed.    [provider]  meloxicam (MOBIC) 15 MG tablet TAKE 1 TABLET BY MOUTH EVERY DAY WITH A MEALS    [provider]  Misc Natural Products (OSTEO BI-FLEX ADV DOUBLE ST PO) Take 1 tablet by mouth 2 (two) times daily.     [provider]  montelukast (SINGULAIR) 10 MG tablet TAKE 1 TABLET BY MOUTH EVERY DAY 04/22/16   Birdie Sons, MD  Multiple Vitamin (MULTIVITAMIN WITH MINERALS) TABS tablet Take 1 tablet by mouth daily.    [provider]  Omega-3 Fatty Acids (FISH OIL) 1000 MG CAPS Take 1,000 mg by mouth daily.     [provider]  omeprazole (  PRILOSEC) 20 MG capsule Take 20 mg by mouth daily before breakfast.     [provider]  oxybutynin (DITROPAN-XL) 10 MG 24 hr tablet TAKE 1 TABLET BY MOUTH AT BEDTIME 08/21/17   Birdie Sons, MD  Probiotic Product (ALIGN) 4 MG CAPS Take 4 mg by mouth daily.     [provider]  QUEtiapine (SEROQUEL) 25 MG tablet Take 1 tablet (25 mg total) by mouth at bedtime as needed. For sleep 11/08/17   Ursula Alert, MD  QUEtiapine (SEROQUEL) 50 MG tablet Take 1.5 tablets (75 mg total) by mouth at bedtime. 11/08/17   Ursula Alert, MD  ranitidine (ZANTAC) 150 MG tablet Take 150 mg by mouth at bedtime.    [provider]  sertraline (ZOLOFT) 50 MG tablet Take 1 tablet (50 mg total) by mouth at bedtime. 11/08/17   Ursula Alert, MD  theophylline (UNIPHYL) 400 MG 24 hr tablet TAKE 1 TABLET BY MOUTH DAILY. 08/22/16   Birdie Sons, MD  tiZANidine (ZANAFLEX) 4 MG  tablet Take 1 tablet (4 mg total) by mouth every 8 (eight) hours as needed for muscle spasms. 06/17/17   Birdie Sons, MD  traMADol (ULTRAM) 50 MG tablet Take 2 tablets (100 mg total) by mouth 2 (two) times daily. 03/14/17   Birdie Sons, MD  traZODone (DESYREL) 50 MG tablet Take 0.5-1 tablets (25-50 mg total) by mouth at bedtime as needed for sleep. 11/08/17   Ursula Alert, MD    Allergies Arthrotec [diclofenac-misoprostol]; Codeine; Diclofenac; Hydrochlorothiazide; Penicillins; Sulfa antibiotics; Tiotropium bromide monohydrate; Tylenol [acetaminophen]; Morphine; and Pantoprazole  Family History  Problem Relation Age of Onset  . Breast cancer Sister   . Stroke Father   . Emphysema Father   . Anxiety disorder Father   . Depression Father   . Anxiety disorder Brother     Social History Social History   Tobacco Use  . Smoking status: Former Smoker    Years: 30.00  . Smokeless tobacco: Never Used  . Tobacco comment: quit in 1989  Substance Use Topics  . Alcohol use: No    Alcohol/week: 0.0 oz  . Drug use: No    Review of Systems Constitutional: No fever/chills Eyes: No visual changes. ENT: No sore throat. Cardiovascular: Denies chest pain. Respiratory: Positive for shortness of breath. Gastrointestinal: No abdominal pain.  No nausea, no vomiting.  No diarrhea.  No constipation. Genitourinary: Negative for dysuria. Musculoskeletal: Negative for back pain. Skin: Negative for rash. Neurological: Negative for headaches, focal weakness or numbness.   ____________________________________________   PHYSICAL EXAM:  VITAL SIGNS: ED Triage Vitals  Enc Vitals Group     BP      Pulse      Resp      Temp      Temp src      SpO2      Weight      Height      Head Circumference      Peak Flow      Pain Score      Pain Loc      Pain Edu?      Excl. in [place]?     Constitutional: Pleasant cooperative somewhat sad and flat affect Eyes: PERRL EOMI. Head:  Atraumatic. Nose: No congestion/rhinnorhea. Mouth/Throat: No trismus Neck: No stridor.   Cardiovascular: Normal rate, regular rhythm. Grossly normal heart sounds.  Good peripheral circulation. Respiratory: No accessory muscle use speaks in full clear sentences diffuse wheezing in all fields although lung  sounds are equal bilaterally Gastrointestinal: Soft nontender Musculoskeletal: No lower extremity edema   Neurologic:  Normal speech and language. No gross focal neurologic deficits are appreciated. Skin:  Skin is warm, dry and intact. No rash noted. Psychiatric: Somewhat strange and flat affect    ____________________________________________   DIFFERENTIAL includes but not limited to  COPD exacerbation, pneumonia, pneumothorax, depression, suicide attempt ____________________________________________   LABS (all labs ordered are listed, but only abnormal results are displayed)  Labs Reviewed  ACETAMINOPHEN LEVEL - Abnormal; Notable for the following components:      Result Value   Acetaminophen (Tylenol), Serum <10 (*)    All other components within normal limits  COMPREHENSIVE METABOLIC PANEL - Abnormal; Notable for the following components:   Glucose, Bld 107 (*)    Total Protein 6.1 (*)    All other components within normal limits  CBC WITH DIFFERENTIAL/PLATELET - Abnormal; Notable for the following components:   RDW 15.1 (*)    Neutro Abs 8.8 (*)    All other components within normal limits  URINE DRUG SCREEN, QUALITATIVE ([facility] ONLY) - Abnormal; Notable for the following components:   Barbiturates, Ur Screen   (*)    Value: Result not available. Reagent lot number recalled by manufacturer.   All other components within normal limits  ETHANOL  SALICYLATE LEVEL    Lab work reviewed by me with no acute disease __________________________________________  EKG  ED ECG REPORT I, Darel Hong, the attending physician, personally viewed and interpreted this  ECG.  Date: 11/24/2017 EKG Time:  Rate: 110 Rhythm: Sinus tachycardia with multiple PVCs QRS Axis: Leftward axis Intervals: normal ST/T Wave abnormalities: normal Narrative Interpretation: no evidence of acute ischemia  ____________________________________________  RADIOLOGY  Chest x-ray reviewed by me consistent with left lower lobe infiltrate ____________________________________________   PROCEDURES  Procedure(s) performed: no  Procedures  Critical Care performed: no  Observation: no ____________________________________________   INITIAL IMPRESSION / ASSESSMENT AND PLAN / ED COURSE  Pertinent labs & imaging results that were available during my care of the patient were reviewed by me and considered in my medical decision making (see chart for details).       ----------------------------------------- 5:58 PM on 11/24/2017 -----------------------------------------  The patient's lungs are diffusely wheezy and she reports an increasingly productive cough.  Chest x-ray is concerning for focal infiltrate.  At this point I will treat her for a COPD exacerbation with 3 duo nebs, prednisone, as well as Levaquin given the focal infiltrate for possible pneumonia on top. ____________________________________________  ----------------------------------------- 7:33 PM on 11/24/2017 -----------------------------------------  After breathing treatments the patient feels improved.  Dr. Weber Cooks has seen and evaluated the patient and has recommended Risperdal instead of Seroquel.  He did offer the patient outpatient management however the patient and her daughter were not comfortable at this time.  She will remain voluntary pending geriatric psychiatric placement.  If the patient should feel improved with her new medications it is reasonable to have her follow-up as an outpatient.  Her COPD exacerbation/pneumonia are relatively mild and she does not require admission for her medical  issues.  We will continue a total of 5 days of prednisone as well as a total of 7 days of Levaquin.  FINAL CLINICAL IMPRESSION(S) / ED DIAGNOSES  Final diagnoses:  Hallucinations  Suicidal ideation  COPD with acute exacerbation ([place])  Community acquired pneumonia of left lower lobe of lung ([place])      NEW MEDICATIONS STARTED DURING THIS VISIT:  New Prescriptions  No medications on file     Note:  This document was prepared using Dragon voice recognition software and may include unintentional dictation errors.     Darel Hong, MD 11/24/17 [phone]

## 2017-11-25 DIAGNOSIS — J440 Chronic obstructive pulmonary disease with acute lower respiratory infection: Secondary | ICD-10-CM | POA: Diagnosis not present

## 2017-11-25 DIAGNOSIS — F203 Undifferentiated schizophrenia: Secondary | ICD-10-CM | POA: Diagnosis not present

## 2017-11-25 DIAGNOSIS — Z79899 Other long term (current) drug therapy: Secondary | ICD-10-CM | POA: Diagnosis not present

## 2017-11-25 DIAGNOSIS — Z7982 Long term (current) use of aspirin: Secondary | ICD-10-CM | POA: Diagnosis not present

## 2017-11-25 DIAGNOSIS — Z888 Allergy status to other drugs, medicaments and biological substances status: Secondary | ICD-10-CM | POA: Diagnosis not present

## 2017-11-25 DIAGNOSIS — I10 Essential (primary) hypertension: Secondary | ICD-10-CM | POA: Diagnosis not present

## 2017-11-25 DIAGNOSIS — Z886 Allergy status to analgesic agent status: Secondary | ICD-10-CM | POA: Diagnosis not present

## 2017-11-25 DIAGNOSIS — K219 Gastro-esophageal reflux disease without esophagitis: Secondary | ICD-10-CM | POA: Diagnosis not present

## 2017-11-25 DIAGNOSIS — J181 Lobar pneumonia, unspecified organism: Secondary | ICD-10-CM | POA: Diagnosis not present

## 2017-11-25 DIAGNOSIS — J189 Pneumonia, unspecified organism: Secondary | ICD-10-CM | POA: Diagnosis not present

## 2017-11-25 DIAGNOSIS — Z87891 Personal history of nicotine dependence: Secondary | ICD-10-CM | POA: Diagnosis not present

## 2017-11-25 DIAGNOSIS — F209 Schizophrenia, unspecified: Secondary | ICD-10-CM | POA: Diagnosis not present

## 2017-11-25 DIAGNOSIS — F329 Major depressive disorder, single episode, unspecified: Secondary | ICD-10-CM | POA: Diagnosis not present

## 2017-11-25 DIAGNOSIS — J441 Chronic obstructive pulmonary disease with (acute) exacerbation: Secondary | ICD-10-CM | POA: Diagnosis not present

## 2017-11-25 DIAGNOSIS — F200 Paranoid schizophrenia: Secondary | ICD-10-CM | POA: Diagnosis not present

## 2017-11-25 DIAGNOSIS — R45851 Suicidal ideations: Secondary | ICD-10-CM | POA: Diagnosis not present

## 2017-11-25 MED ORDER — DIAZEPAM 2 MG PO TABS
2.0000 mg | ORAL_TABLET | Freq: Once | ORAL | Status: AC
  Administered 2017-11-25: 2 mg via ORAL
  Filled 2017-11-25: qty 1

## 2017-11-25 MED ORDER — DIAZEPAM 2 MG PO TABS
ORAL_TABLET | ORAL | Status: AC
  Filled 2017-11-25: qty 1

## 2017-11-25 MED ORDER — DIAZEPAM 2 MG PO TABS
2.0000 mg | ORAL_TABLET | Freq: Once | ORAL | Status: AC
  Administered 2017-11-25: 2 mg via ORAL

## 2017-11-25 NOTE — ED Notes (Signed)
Called [facility] to transport patient to [place]

## 2017-11-25 NOTE — BH Assessment (Signed)
Patient has been accepted to [facility].  Patient assigned to room 157-1 Accepting physician is Dr. Caren Hazy.  Call report to [phone].  Representative was [place].   ER Staff is aware of it:  [place], ER Sectary  Dr. Corky Downs, ER MD  Ophelia Charter, Patient's Nurse  Patient's daughter Joyceann Kruser) have been updated as well.  -Daughter came to visit the patient to help assessed her ability to sign her self in voluntarily. She states the patient is able to sign herself in. Writer spoke with ER MD (Dr. Corky Downs) to update him about the conversation with daughter. He is in agreement with patient traveling via [facility] as a voluntary patient.   Address: [address]  [place], [place] [address]

## 2017-11-25 NOTE — ED Provider Notes (Signed)
-----------------------------------------   6:18 AM on 11/25/2017 -----------------------------------------   Blood pressure (!) 183/93, pulse 96, temperature 98.5 F (36.9 C), temperature source Oral, resp. rate (!) 27, height 5\' 5"  (1.651 m), weight 114.8 kg (253 lb), SpO2 95 %.  The patient requested sleeping agent and something for her restless legs overnight.  Calm and cooperative at this time.  Disposition is pending Psychiatry/Behavioral Medicine team recommendations.     Paulette Blanch, MD 11/25/17 [phone]

## 2017-11-25 NOTE — Consult Note (Signed)
[facility] Consult   Reason for Consult: Follow-up consult 77 year old woman with a history of schizophrenia Referring Physician: Corky Downs Patient Identification: BHAVANA KADY MRN:  [id number] Principal Diagnosis: Schizophrenia, undifferentiated ([place]) Diagnosis:   Patient Active Problem List   Diagnosis Date Noted  . 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 COPD with acute exacerbation ([place]) [J44.1] 10/24/2017  . Nocturnal hypoxia [G47.34] 08/21/2016  . Restless leg syndrome [G25.81] 08/20/2016  . Impingement syndrome of shoulder region [M75.40] 07/29/2016  . Hyponatremia [E87.1] 03/21/2016  . Pneumonia [J18.9] 03/18/2016  . History of suicide attempt [Z91.5] 03/16/2016  . Overdose [T50.901A] 03/16/2016  . Pulmonary hypertension ([place]) [I27.20] 11/11/2015  . Anxiety [F41.9] 09/22/2015  . Depression [F32.9] 09/22/2015  . Osteoarthritis [M19.90] 09/10/2015  . Rosacea [L71.9] 07/03/2015  . Breast pain [N64.4] 11/07/2014  . Callus of foot [L84] 11/07/2014  . CN (constipation) [K59.00] 11/07/2014  . Dermatitis, eczematoid [L30.9] 11/07/2014  . Diverticulosis of colon [K57.30] 11/07/2014  . Dizziness [R42] 11/07/2014  . Can't get food down [IMO0001] 11/07/2014  . Accumulation of fluid in tissues [R60.9] 11/07/2014  . Fatigue [R53.83] 11/07/2014  . FOM (frequency of micturition) [R35.0] 11/07/2014  . Tension type headache [G44.209] 11/07/2014  . LBP (low back pain) [M54.5] 11/07/2014  . Episodic mood disorder ([place]) [F39] 11/07/2014  . Extreme obesity [E66.8] 11/07/2014  . Muscle ache [M79.10] 11/07/2014  . Disturbance of skin sensation [R20.9] 11/07/2014  . Awareness of heartbeats [R00.2] 11/07/2014  . Jerking [R25.3] 11/07/2014  . Body tinea [B35.4] 11/07/2014  . Essential (primary) hypertension [I10] 10/10/2014  . Moderate COPD (chronic obstructive pulmonary disease) ([place]) [J44.9] 04/01/2014  . Hx of obesity [Z86.39] 04/01/2014  . CAFL  (chronic airflow limitation) ([place]) [J44.9] 01/25/2009  . Malaise and fatigue [R53.81, R53.83] 11/26/2008  . Aphasia [R47.01] 09/26/2008  . 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 GERD (gastroesophageal reflux disease) [K21.9] 06/07/1998  . HLD (hyperlipidemia) [E78.5] 06/07/1998  . OP (osteoporosis) [M81.0] 06/07/1998  . H/O total hysterectomy [Z90.710] 03/08/1987  . Schizophrenia, in remission ([place]) [F20.9] 06/07/1978    Total Time spent with patient: 20 minutes  Subjective:   HILLERY ZACHMAN is a 77 y.o. female patient admitted with "I am feeling better".  HPI: Patient seen chart reviewed.  Spoke with the patient and her daughter.  Patient slept last night after receiving Risperdal this morning she presents much more appropriately than last night.  She is able to converse and hold a normal conversation.  Still having some psychotic symptoms and anxious but lucid and understands her situation.  Not at all agitated or threatening.  No suicidal ideation.  No clear side effects from the Risperdal.  Patient has been accepted to a geriatric unit at [facility].  Past Psychiatric History: History of schizophrenia going back decades.  Recent destabilization after stopping Stelazine.  Risk to Self: Suicidal Ideation: No Suicidal Intent: No Is patient at risk for suicide?: No Suicidal Plan?: No Access to Means: No What has been your use of drugs/alcohol within the last 12 months?: Reports of none How many times?: 0 Other Self Harm Risks: reports of none Triggers for Past Attempts: None known Intentional Self Injurious Behavior: None Risk to Others: Homicidal Ideation: No Thoughts of Harm to Others: No Current Homicidal Intent: No Current Homicidal Plan: No Access to Homicidal Means: No Identified Victim: Reports of none History of harm to others?: No Assessment of Violence: None Noted Violent Behavior Description: Reports of none Does patient have  access to weapons?: No Criminal Charges Pending?: No Does patient have a court date: No Prior Inpatient Therapy: Prior Inpatient Therapy: Yes Prior Therapy Dates: Over 20 years ago Prior Therapy Facilty/Provider(s): [facility] Reason for Treatment: Psychosis Prior Outpatient Therapy: Prior Outpatient Therapy: Yes Prior Therapy Dates: Current Prior Therapy Facilty/Provider(s): [facility], Private office of Dr. Bridgett Larsson Reason for Treatment: Schizophrenia Does patient have an ACCT team?: No Does patient have [facility] services? : No Does patient have P4CC services?: No  Past Medical History:  Past Medical History:  Diagnosis Date  . Anxiety   . Arthritis   . Cataract    right eye  . COPD (chronic obstructive pulmonary disease) ([place])   . Depression   . Dyspnea    DOE  . Dysrhythmia   . Edema    FEET/ANKLES  . GERD (gastroesophageal reflux disease)   . H/O wheezing   . History of hiatal hernia   . HOH (hard of hearing)   . Hypertension   . Incontinence of urine in female   . Orthopnea   . Pain    CHRONIC KNEE  . Pain    CHRONIC KNEE  . Paranoid disorder ([place])   . RLS (restless legs syndrome)   . Tremors of nervous system    HANDS    Past Surgical History:  Procedure Laterality Date  . ABDOMINAL HYSTERECTOMY  1988  . APPENDECTOMY  1988  . CARDIAC CATHETERIZATION    . CATARACT EXTRACTION W/PHACO Right 07/14/2016   Procedure: CATARACT EXTRACTION PHACO AND INTRAOCULAR LENS PLACEMENT ([place]) anterior vitrectomy;  Surgeon: Estill Cotta, MD;  Location: [facility] ORS;  Service: Ophthalmology;  Laterality: Right;  [place] 02:40 AP% 24.4 CDE 59.98 Fluid pack # [id number]  . CATARACT EXTRACTION W/PHACO Left 02/16/2017   Procedure: CATARACT EXTRACTION PHACO AND INTRAOCULAR LENS PLACEMENT (IOC);  Surgeon: Estill Cotta, MD;  Location: [facility] ORS;  Service: Ophthalmology;  Laterality: Left;  Lot # [id number] H [place]: 01:11.8 AP%: 23.5 CDE: 32.09   .  ESOPHAGOGASTRODUODENOSCOPY N/A 11/11/2014   Procedure: ESOPHAGOGASTRODUODENOSCOPY (EGD);  Surgeon: Josefine Class, MD;  Location: [facility] ENDOSCOPY;  Service: Endoscopy;  Laterality: N/A;  . TONSILLECTOMY AND ADENOIDECTOMY    . TRIGGER FINGER RELEASE  2004   Family History:  Family History  Problem Relation Age of Onset  . Breast cancer Sister   . Stroke Father   . Emphysema Father   . Anxiety disorder Father   . Depression Father   . Anxiety disorder Brother    Family Psychiatric  History: None known Social History:  Social History   Substance and Sexual Activity  Alcohol Use No  . Alcohol/week: 0.0 oz     Social History   Substance and Sexual Activity  Drug Use No    Social History   Socioeconomic History  . Marital status: Widowed    Spouse name: Not on file  . Number of children: 2  . Years of education: Not on file  . Highest education level: 12th grade  Occupational History  . 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 Financial resource strain: Not hard at all  . Food insecurity:    Worry: Never true    Inability: Never true  . Transportation needs:    Medical: No    Non-medical: No  Tobacco Use  . Smoking status: Former Smoker    Years: 30.00  . Smokeless tobacco: Never Used  . Tobacco comment: quit in 1989  Substance and Sexual  Activity  . Alcohol use: No    Alcohol/week: 0.0 oz  . Drug use: No  . Sexual activity: Not Currently  Lifestyle  . Physical activity:    Days per week: 0 days    Minutes per session: 0 min  . Stress: Only a little  Relationships  . Social connections:    Talks on phone: Not on file    Gets together: Not on file    Attends religious service: Not on file    Active member of club or organization: Not on file    Attends meetings of clubs or organizations: Not on file    Relationship status: Not on file  Other Topics Concern  . Not on file  Social History Narrative  . Not on file   Additional Social  History:    Allergies:   Allergies  Allergen Reactions  . Arthrotec [Diclofenac-Misoprostol] Hives  . Codeine Other (See Comments)    Headache   . Diclofenac Other (See Comments) and Hives    Pt unsure allergy  . Hydrochlorothiazide Other (See Comments)    Weakness   . Penicillins Hives    Has patient had a PCN reaction causing immediate rash, facial/tongue/throat swelling, SOB or lightheadedness with hypotension: No Has patient had a PCN reaction causing severe rash involving mucus membranes or skin necrosis: No Has patient had a PCN reaction that required hospitalization: No Has patient had a PCN reaction occurring within the last 10 years: No If all of the above answers are "NO", then may proceed with Cephalosporin use.  . Sulfa Antibiotics Hives  . Tiotropium Bromide Monohydrate Other (See Comments)    Blurry vision   . Tylenol [Acetaminophen] Other (See Comments)    Doesn't work  . Morphine Hives and Rash  . Pantoprazole Rash    Labs:  Results for orders placed or performed during the hospital encounter of 11/24/17 (from the past 48 hour(s))  Acetaminophen level     Status: Abnormal   Collection Time: 11/24/17  4:54 PM  Result Value Ref Range   Acetaminophen (Tylenol), Serum <10 (L) 10 - 30 ug/mL    Comment: (NOTE) Therapeutic concentrations vary significantly. A range of 10-30 ug/mL  may be an effective concentration for many patients. However, some  are best treated at concentrations outside of this range. Acetaminophen concentrations >150 ug/mL at 4 hours after ingestion  and >50 ug/mL at 12 hours after ingestion are often associated with  toxic reactions. Performed at [facility], [address] [place], [place] [address]   Ethanol     Status: None   Collection Time: 11/24/17  4:54 PM  Result Value Ref Range   Alcohol, Ethyl (B) <10 <10 mg/dL    Comment: (NOTE) Lowest detectable limit for serum alcohol is 10 mg/dL. For medical purposes  only. Performed at [facility], [address] [place], [place] [address]   Comprehensive metabolic panel     Status: Abnormal   Collection Time: 11/24/17  4:54 PM  Result Value Ref Range   Sodium 140 135 - 145 mmol/L   Potassium 4.1 3.5 - 5.1 mmol/L   Chloride 106 101 - 111 mmol/L   CO2 27 22 - 32 mmol/L   Glucose, Bld 107 (H) 65 - 99 mg/dL   BUN 17 6 - 20 mg/dL   Creatinine, Ser 0.76 0.44 - 1.00 mg/dL   Calcium 9.3 8.9 - 10.3 mg/dL   Total Protein 6.1 (L) 6.5 - 8.1 g/dL   Albumin  3.6 3.5 - 5.0 g/dL   AST 22 15 - 41 U/L   ALT 20 14 - 54 U/L   Alkaline Phosphatase 74 38 - 126 U/L   Total Bilirubin 0.4 0.3 - 1.2 mg/dL   GFR calc non Af Amer >60 >60 mL/min   GFR calc Af Amer >60 >60 mL/min    Comment: (NOTE) The eGFR has been calculated using the CKD EPI equation. This calculation has not been validated in all clinical situations. eGFR's persistently <60 mL/min signify possible Chronic Kidney Disease.    Anion gap 7 5 - 15    Comment: Performed at [facility], [address] [place], [place] [address]  CBC with Differential     Status: Abnormal   Collection Time: 11/24/17  4:54 PM  Result Value Ref Range   WBC 10.6 3.6 - 11.0 K/uL   RBC 4.40 3.80 - 5.20 MIL/uL   Hemoglobin 13.9 12.0 - 16.0 g/dL   HCT 41.7 35.0 - 47.0 %   MCV 94.7 80.0 - 100.0 fL   MCH 31.7 26.0 - 34.0 pg   MCHC 33.4 32.0 - 36.0 g/dL   RDW 15.1 (H) 11.5 - 14.5 %   Platelets 192 150 - 440 K/uL   Neutrophils Relative % 83 %   Neutro Abs 8.8 (H) 1.4 - 6.5 K/uL   Lymphocytes Relative 11 %   Lymphs Abs 1.1 1.0 - 3.6 K/uL   Monocytes Relative 5 %   Monocytes Absolute 0.5 0.2 - 0.9 K/uL   Eosinophils Relative 1 %   Eosinophils Absolute 0.1 0 - 0.7 K/uL   Basophils Relative 0 %   Basophils Absolute 0.0 0 - 0.1 K/uL    Comment: Performed at [facility], [address] [place], [place] [address]  Salicylate level     Status: None   Collection Time: 11/24/17  4:54 PM   Result Value Ref Range   Salicylate Lvl <5.0 2.8 - 30.0 mg/dL    Comment: Performed at [facility], [address] [place], [place] [address]  Urine Drug Screen, Qualitative     Status: Abnormal   Collection Time: 11/24/17  4:54 PM  Result Value Ref Range   Tricyclic, Ur Screen NONE DETECTED NONE DETECTED   Amphetamines, Ur Screen NONE DETECTED NONE DETECTED   MDMA (Ecstasy)Ur Screen NONE DETECTED NONE DETECTED   Cocaine Metabolite,Ur [place] NONE DETECTED NONE DETECTED   Opiate, Ur Screen NONE DETECTED NONE DETECTED   Phencyclidine (PCP) Ur S NONE DETECTED NONE DETECTED   Cannabinoid 50 Ng, Ur [place] NONE DETECTED NONE DETECTED   Barbiturates, Ur Screen (A) NONE DETECTED    Result not available. Reagent lot number recalled by manufacturer.   Benzodiazepine, Ur Scrn NONE DETECTED NONE DETECTED   Methadone Scn, Ur NONE DETECTED NONE DETECTED    Comment: (NOTE) Tricyclics + metabolites, urine    Cutoff 1000 ng/mL Amphetamines + metabolites, urine  Cutoff 1000 ng/mL MDMA (Ecstasy), urine              Cutoff 500 ng/mL Cocaine Metabolite, urine          Cutoff 300 ng/mL Opiate + metabolites, urine        Cutoff 300 ng/mL Phencyclidine (PCP), urine         Cutoff 25 ng/mL Cannabinoid, urine                 Cutoff 50 ng/mL Barbiturates + metabolites, urine  Cutoff 200 ng/mL Benzodiazepine, urine  Cutoff 200 ng/mL Methadone, urine                   Cutoff 300 ng/mL The urine drug screen provides only a preliminary, unconfirmed analytical test result and should not be used for non-medical purposes. Clinical consideration and professional judgment should be applied to any positive drug screen result due to possible interfering substances. A more specific alternate chemical method must be used in order to obtain a confirmed analytical result. Gas chromatography / mass spectrometry (GC/MS) is the preferred confirmat ory method. Performed at [facility], [address] [place], [place] [address]     Current Facility-Administered Medications  Medication Dose Route Frequency Provider Last Rate Last Dose  . benazepril (LOTENSIN) tablet 5 mg  5 mg Oral Daily Valerye Kobus, Madie Reno, MD   5 mg at 11/25/17 0959  . benztropine (COGENTIN) tablet 0.5 mg  0.5 mg Oral QHS Xadrian Craighead T, MD   0.5 mg at 11/24/17 2212  . fluticasone (FLONASE) 50 MCG/ACT nasal spray 2 spray  2 spray Each Nare Daily Lokelani Lutes, Madie Reno, MD   2 spray at 11/25/17 1000  . gabapentin (NEURONTIN) capsule 100 mg  100 mg Oral BID Joanne Salah, Madie Reno, MD   100 mg at 11/25/17 0959  . levofloxacin (LEVAQUIN) tablet 500 mg  500 mg Oral Daily Darel Hong, MD   500 mg at 11/25/17 0959  . loratadine (CLARITIN) tablet 10 mg  10 mg Oral Daily Amira Podolak, Madie Reno, MD   10 mg at 11/25/17 0958  . mometasone-formoterol (DULERA) 100-5 MCG/ACT inhaler 2 puff  2 puff Inhalation BID Ladale Sherburn, Madie Reno, MD   2 puff at 11/25/17 1000  . montelukast (SINGULAIR) tablet 10 mg  10 mg Oral QHS Sani Loiseau, Madie Reno, MD   10 mg at 11/24/17 1959  . oxybutynin (DITROPAN-XL) 24 hr tablet 10 mg  10 mg Oral QHS Kinsler Soeder T, MD      . pantoprazole (PROTONIX) EC tablet 40 mg  40 mg Oral Daily Marelyn Rouser, Madie Reno, MD   40 mg at 11/25/17 0957  . predniSONE (DELTASONE) tablet 60 mg  60 mg Oral Q breakfast Darel Hong, MD   60 mg at 11/25/17 0957  . risperiDONE (RISPERDAL) tablet 2 mg  2 mg Oral QHS Belmont Valli, Madie Reno, MD   2 mg at 11/24/17 2206   Current Outpatient Medications  Medication Sig Dispense Refill  . albuterol (PROVENTIL) (2.5 MG/3ML) 0.083% nebulizer solution Take 3 mLs (2.5 mg total) by nebulization 2 (two) times daily as needed for wheezing or shortness of breath. 75 mL 5  . benazepril (LOTENSIN) 5 MG tablet TAKE 1 TABLET(5 MG) BY MOUTH DAILY 30 tablet 11  . busPIRone (BUSPAR) 10 MG tablet Take 1 tablet (10 mg total) by mouth 3 (three) times daily. 270 tablet 1  . Calcium Carbonate-Vitamin D (CALCIUM 600+D) 600-400 MG-UNIT  tablet Take 1 tablet by mouth 2 (two) times daily.     . Cranberry 250 MG CAPS Take 1 capsule by mouth daily.     . fluticasone (FLONASE) 50 MCG/ACT nasal spray Place 1 spray into both nostrils daily.    . Fluticasone-Salmeterol (WIXELA INHUB) 250-50 MCG/DOSE AEPB Inhale 1 puff into the lungs 2 (two) times daily.    . Fluticasone-Umeclidin-Vilant (TRELEGY ELLIPTA) 100-62.5-25 MCG/INH AEPB Inhale 1 puff into the lungs daily. 1 each 2  . gabapentin (NEURONTIN) 100 MG capsule TAKE 1 CAPSULE(100 MG) BY MOUTH TWICE DAILY 60 capsule 11  . gabapentin (  NEURONTIN) 300 MG capsule TAKE 2 CAPSULES(600 MG) BY MOUTH AT BEDTIME 60 capsule 11  . hydrOXYzine (ATARAX/VISTARIL) 25 MG tablet TAKE 1 TABLET(25 MG) BY MOUTH EVERY 8 HOURS AS NEEDED FOR ANXIETY 60 tablet 5  . loratadine (CLARITIN) 10 MG tablet Take 10 mg by mouth daily.    . magnesium oxide (MAG-OX) 400 MG tablet Take 400 mg by mouth 2 (two) times daily.    . Melatonin 10 MG CAPS Take 10 mg by mouth at bedtime as needed.    . meloxicam (MOBIC) 15 MG tablet TAKE 1 TABLET BY MOUTH EVERY DAY WITH A MEALS    . Misc Natural Products (OSTEO BI-FLEX ADV DOUBLE ST PO) Take 1 tablet by mouth 2 (two) times daily.     . montelukast (SINGULAIR) 10 MG tablet TAKE 1 TABLET BY MOUTH EVERY DAY 30 tablet 12  . Multiple Vitamin (MULTIVITAMIN WITH MINERALS) TABS tablet Take 1 tablet by mouth daily.    . Omega-3 Fatty Acids (FISH OIL) 1000 MG CAPS Take 1,000 mg by mouth daily.     [name] Kitchen omeprazole (PRILOSEC) 20 MG capsule Take 20 mg by mouth daily before breakfast.     . oxybutynin (DITROPAN-XL) 10 MG 24 hr tablet TAKE 1 TABLET BY MOUTH AT BEDTIME 30 tablet 5  . Probiotic Product (ALIGN) 4 MG CAPS Take 4 mg by mouth daily.     . QUEtiapine (SEROQUEL) 100 MG tablet Take 100 mg by mouth at bedtime.    . ranitidine (ZANTAC) 150 MG tablet Take 150 mg by mouth at bedtime.    . sertraline (ZOLOFT) 50 MG tablet Take 1 tablet (50 mg total) by mouth at bedtime. 90 tablet 1  .  theophylline (UNIPHYL) 400 MG 24 hr tablet TAKE 1 TABLET BY MOUTH DAILY. (Patient taking differently: TAKE 1/2 TABLET BY MOUTH DAILY.) 30 tablet 12  . tiZANidine (ZANAFLEX) 4 MG tablet Take 1 tablet (4 mg total) by mouth every 8 (eight) hours as needed for muscle spasms. 30 tablet 5  . traZODone (DESYREL) 50 MG tablet Take 0.5-1 tablets (25-50 mg total) by mouth at bedtime as needed for sleep. 90 tablet 1  . aspirin EC 325 MG tablet Take 1 tablet (325 mg total) by mouth daily. (Patient taking differently: Take 325 mg by mouth at bedtime. ) 30 tablet 2  . QUEtiapine (SEROQUEL) 25 MG tablet Take 1 tablet (25 mg total) by mouth at bedtime as needed. For sleep (Patient not taking: Reported on 11/25/2017) 30 tablet 2  . QUEtiapine (SEROQUEL) 50 MG tablet Take 1.5 tablets (75 mg total) by mouth at bedtime. (Patient not taking: Reported on 11/25/2017) 135 tablet 1  . traMADol (ULTRAM) 50 MG tablet Take 2 tablets (100 mg total) by mouth 2 (two) times daily. (Patient not taking: Reported on 11/25/2017) 60 tablet 3    Musculoskeletal: Strength & Muscle Tone: decreased Gait & Station: unsteady Patient leans: N/A  Psychiatric Specialty Exam: Physical Exam  Nursing note and vitals reviewed. Constitutional: She appears well-developed and well-nourished.  HENT:  Head: Normocephalic and atraumatic.  Eyes: Pupils are equal, round, and reactive to light. Conjunctivae are normal.  Neck: Normal range of motion.  Cardiovascular: Normal heart sounds.  Respiratory: Effort normal. No respiratory distress. She has wheezes.  GI: Soft.  Musculoskeletal: Normal range of motion.  Neurological: She is alert.  Skin: Skin is warm and dry.  Psychiatric: Her speech is normal. Judgment normal. Her mood appears anxious. She is not agitated and not aggressive. Thought content  is paranoid. Cognition and memory are impaired. She expresses no homicidal and no suicidal ideation.    Review of Systems  Constitutional: Negative.    HENT: Negative.   Eyes: Negative.   Respiratory: Negative.   Cardiovascular: Negative.   Gastrointestinal: Negative.   Musculoskeletal: Negative.   Skin: Negative.   Neurological: Negative.   Psychiatric/Behavioral: Positive for hallucinations. Negative for depression, memory loss, substance abuse and suicidal ideas. The patient is nervous/anxious. The patient does not have insomnia.     Blood pressure (!) 183/93, pulse 96, temperature 98.5 F (36.9 C), temperature source Oral, resp. rate (!) 27, height '5\' 5"'  (1.651 m), weight 114.8 kg (253 lb), SpO2 95 %.Body mass index is 42.1 kg/m.  General Appearance: Casual  Eye Contact:  Good  Speech:  Slow  Volume:  Decreased  Mood:  Euthymic  Affect:  Congruent  Thought Process:  Goal Directed  Orientation:  Full (Time, Place, and Person)  Thought Content:  Logical and Hallucinations: Auditory  Suicidal Thoughts:  No  Homicidal Thoughts:  No  Memory:  Immediate;   Fair Recent;   Fair Remote;   Fair  Judgement:  Fair  Insight:  Fair  Psychomotor Activity:  Decreased  Concentration:  Concentration: Fair  Recall:  [employer] of Knowledge:  Fair  Language:  Fair  Akathisia:  No  Handed:  Right  AIMS (if indicated):     Assets:  Desire for Improvement Housing Social Support  ADL's:  Intact  Cognition:  WNL  Sleep:        Treatment Plan Summary: Medication management and Plan 77 year old woman.  See previous note.  She was given 2 mg of Risperdal and one half of a milligram of Cogentin last night.  Patient feels a little calmer this morning.  Still having some psychotic symptoms but her insight is pretty good.  She has been accepted to [facility] to the geriatric unit and is agreeable to the plan.  Answered questions and discussed the plan with the patient and her daughter.  Reviewed with ER doctor and TTS.  No change to medication at this time.  Disposition: Recommend psychiatric Inpatient admission when medically  cleared.  Alethia Berthold, MD 11/25/2017 2:51 PM

## 2017-11-25 NOTE — ED Notes (Signed)
EMTALA reviewed. 

## 2017-11-25 NOTE — BH Assessment (Signed)
Writer called and left a HIPPA Compliant message with daughter [phone]), requesting a return phone call.

## 2017-11-25 NOTE — Progress Notes (Signed)
Per [place], Teacher, music is pending; they are reviewing insurance benefits.

## 2017-11-25 NOTE — ED Notes (Signed)
Patient c/o bilateral lower extremity discomfort due to restless leg syndrome. MD informed. Medication ordered.

## 2017-12-02 ENCOUNTER — Ambulatory Visit: Payer: Medicare HMO | Admitting: Psychiatry

## 2017-12-02 ENCOUNTER — Other Ambulatory Visit: Payer: Self-pay | Admitting: Family Medicine

## 2017-12-02 ENCOUNTER — Ambulatory Visit (INDEPENDENT_AMBULATORY_CARE_PROVIDER_SITE_OTHER): Payer: Medicare HMO | Admitting: Psychiatry

## 2017-12-02 ENCOUNTER — Encounter: Payer: Self-pay | Admitting: Psychiatry

## 2017-12-02 DIAGNOSIS — F200 Paranoid schizophrenia: Secondary | ICD-10-CM

## 2017-12-02 DIAGNOSIS — F410 Panic disorder [episodic paroxysmal anxiety] without agoraphobia: Secondary | ICD-10-CM

## 2017-12-02 DIAGNOSIS — F3342 Major depressive disorder, recurrent, in full remission: Secondary | ICD-10-CM

## 2017-12-02 MED ORDER — ALBUTEROL SULFATE (2.5 MG/3ML) 0.083% IN NEBU
2.50 | INHALATION_SOLUTION | RESPIRATORY_TRACT | Status: DC
Start: ? — End: 2017-12-02

## 2017-12-02 MED ORDER — AMLODIPINE BESYLATE 10 MG PO TABS
10.00 | ORAL_TABLET | ORAL | Status: DC
Start: 2017-12-01 — End: 2017-12-02

## 2017-12-02 MED ORDER — BUDESONIDE-FORMOTEROL FUMARATE 80-4.5 MCG/ACT IN AERO
2.00 | INHALATION_SPRAY | RESPIRATORY_TRACT | Status: DC
Start: 2017-11-30 — End: 2017-12-02

## 2017-12-02 MED ORDER — GABAPENTIN 100 MG PO CAPS
100.00 | ORAL_CAPSULE | ORAL | Status: DC
Start: 2017-12-01 — End: 2017-12-02

## 2017-12-02 MED ORDER — DIVALPROEX SODIUM 250 MG PO DR TAB: 250.0000 mg | DELAYED_RELEASE_TABLET | Freq: Two times a day (BID) | ORAL | 1 refills | Status: DC

## 2017-12-02 MED ORDER — BUSPIRONE HCL 10 MG PO TABS
10.00 | ORAL_TABLET | ORAL | Status: DC
Start: 2017-11-30 — End: 2017-12-02

## 2017-12-02 MED ORDER — GABAPENTIN 300 MG PO CAPS
600.00 | ORAL_CAPSULE | ORAL | Status: DC
Start: 2017-11-30 — End: 2017-12-02

## 2017-12-02 MED ORDER — RISPERIDONE 1 MG PO TABS: 1.0000 mg | ORAL_TABLET | Freq: Every day | ORAL | 1 refills | Status: DC

## 2017-12-02 MED ORDER — CLONIDINE HCL 0.1 MG PO TABS
0.10 | ORAL_TABLET | ORAL | Status: DC
Start: ? — End: 2017-12-02

## 2017-12-02 MED ORDER — MELATONIN 3 MG PO TABS
3.00 | ORAL_TABLET | ORAL | Status: DC
Start: 2017-11-30 — End: 2017-12-02

## 2017-12-02 MED ORDER — GENERIC EXTERNAL MEDICATION
Status: DC
Start: ? — End: 2017-12-02

## 2017-12-02 MED ORDER — LISINOPRIL 5 MG PO TABS
5.00 | ORAL_TABLET | ORAL | Status: DC
Start: 2017-12-01 — End: 2017-12-02

## 2017-12-02 MED ORDER — THEOPHYLLINE ER 200 MG PO CP24
200.00 | ORAL_CAPSULE | ORAL | Status: DC
Start: 2017-12-01 — End: 2017-12-02

## 2017-12-02 MED ORDER — BUSPIRONE HCL 10 MG PO TABS: 10.0000 mg | ORAL_TABLET | Freq: Three times a day (TID) | ORAL | 0 refills | Status: DC

## 2017-12-02 MED ORDER — FAMOTIDINE 20 MG PO TABS
20.00 | ORAL_TABLET | ORAL | Status: DC
Start: ? — End: 2017-12-02

## 2017-12-02 MED ORDER — RISPERIDONE 2 MG PO TABS
2.00 | ORAL_TABLET | ORAL | Status: DC
Start: 2017-11-30 — End: 2017-12-02

## 2017-12-02 MED ORDER — CALCIUM CARBONATE-VITAMIN D 600-400 MG-UNIT PO TABS
2.00 | ORAL_TABLET | ORAL | Status: DC
Start: 2017-12-01 — End: 2017-12-02

## 2017-12-02 MED ORDER — FLUTICASONE PROPIONATE 50 MCG/ACT NA SUSP
1.00 | NASAL | Status: DC
Start: 2017-12-01 — End: 2017-12-02

## 2017-12-02 MED ORDER — TIZANIDINE HCL 4 MG PO TABS
4.00 | ORAL_TABLET | ORAL | Status: DC
Start: ? — End: 2017-12-02

## 2017-12-02 MED ORDER — MELOXICAM 7.5 MG PO TABS
15.00 | ORAL_TABLET | ORAL | Status: DC
Start: 2017-12-01 — End: 2017-12-02

## 2017-12-02 MED ORDER — OXYBUTYNIN CHLORIDE ER 5 MG PO TB24
10.00 | ORAL_TABLET | ORAL | Status: DC
Start: 2017-11-30 — End: 2017-12-02

## 2017-12-02 MED ORDER — DIVALPROEX SODIUM 250 MG PO DR TAB
250.00 | DELAYED_RELEASE_TABLET | ORAL | Status: DC
Start: 2017-11-30 — End: 2017-12-02

## 2017-12-02 MED ORDER — ASPIRIN 325 MG PO TABS
325.00 | ORAL_TABLET | ORAL | Status: DC
Start: 2017-12-01 — End: 2017-12-02

## 2017-12-02 NOTE — Patient Instructions (Signed)
PLEASE GET DEPAKOTE LEVEL AS DISCUSSED.  PLEASE REDUCE RISPERIDONE TO 1 MG PO AT BEDTIME .  PLEASE GO TO THE NEAREST ED IF SYMPTOMS WORSENS.

## 2017-12-02 NOTE — Progress Notes (Signed)
Patient ID: Vicki Morales, female   DOB: 1941-04-06, 77 y.o.   MRN: [id number]   Patient's daughter presented this AM without patient stating pt is too tired to come in. Pt was just discharged from hospital after an IP admission at [facility] [place] . Pt's daughter was advised to return later with discharge instructions, medication list as well as patient at 11:45 pm. However , she again presented without pt stating , pt had a fall and however she is doing ok.  Writer attempted to call pt on the phone and pt discussed with writer she feels dizzy after new medication changes.  Will reduce Risperidone to 1 mg po qhs , was discharged on 2 mg.  Will continue Depakote 250 mg po bid. Lab slip given to obtain labs Monday - depakote level.  Discussed with patient was well as daughter to return to clinic on Friday , July 5 th for a follow up.  Also discussed to go to nearest ED if symptoms worsen over the weekend.

## 2017-12-03 ENCOUNTER — Emergency Department: Payer: Medicare HMO

## 2017-12-03 ENCOUNTER — Encounter: Payer: Self-pay | Admitting: Emergency Medicine

## 2017-12-03 ENCOUNTER — Inpatient Hospital Stay: Payer: Self-pay

## 2017-12-03 ENCOUNTER — Other Ambulatory Visit: Payer: Self-pay

## 2017-12-03 ENCOUNTER — Inpatient Hospital Stay
Admission: EM | Admit: 2017-12-03 | Discharge: 2017-12-06 | DRG: 871 | Disposition: A | Discharge status: Home Health | Payer: Medicare HMO | Attending: Internal Medicine | Admitting: Internal Medicine

## 2017-12-03 DIAGNOSIS — F418 Other specified anxiety disorders: Secondary | ICD-10-CM | POA: Diagnosis present

## 2017-12-03 DIAGNOSIS — J441 Chronic obstructive pulmonary disease with (acute) exacerbation: Secondary | ICD-10-CM | POA: Diagnosis present

## 2017-12-03 DIAGNOSIS — Z79899 Other long term (current) drug therapy: Secondary | ICD-10-CM | POA: Diagnosis not present

## 2017-12-03 DIAGNOSIS — Z7982 Long term (current) use of aspirin: Secondary | ICD-10-CM

## 2017-12-03 DIAGNOSIS — Z6841 Body Mass Index (BMI) 40.0 and over, adult: Secondary | ICD-10-CM

## 2017-12-03 DIAGNOSIS — R4182 Altered mental status, unspecified: Secondary | ICD-10-CM | POA: Diagnosis not present

## 2017-12-03 DIAGNOSIS — J440 Chronic obstructive pulmonary disease with acute lower respiratory infection: Secondary | ICD-10-CM | POA: Diagnosis not present

## 2017-12-03 DIAGNOSIS — G9349 Other encephalopathy: Secondary | ICD-10-CM | POA: Diagnosis present

## 2017-12-03 DIAGNOSIS — I248 Other forms of acute ischemic heart disease: Secondary | ICD-10-CM | POA: Diagnosis present

## 2017-12-03 DIAGNOSIS — Z90710 Acquired absence of both cervix and uterus: Secondary | ICD-10-CM

## 2017-12-03 DIAGNOSIS — I10 Essential (primary) hypertension: Secondary | ICD-10-CM | POA: Diagnosis present

## 2017-12-03 DIAGNOSIS — Y95 Nosocomial condition: Secondary | ICD-10-CM | POA: Diagnosis present

## 2017-12-03 DIAGNOSIS — Z87891 Personal history of nicotine dependence: Secondary | ICD-10-CM

## 2017-12-03 DIAGNOSIS — Z66 Do not resuscitate: Secondary | ICD-10-CM | POA: Diagnosis present

## 2017-12-03 DIAGNOSIS — K219 Gastro-esophageal reflux disease without esophagitis: Secondary | ICD-10-CM | POA: Diagnosis present

## 2017-12-03 DIAGNOSIS — A419 Sepsis, unspecified organism: Secondary | ICD-10-CM | POA: Diagnosis not present

## 2017-12-03 DIAGNOSIS — R6520 Severe sepsis without septic shock: Secondary | ICD-10-CM | POA: Diagnosis present

## 2017-12-03 DIAGNOSIS — E871 Hypo-osmolality and hyponatremia: Secondary | ICD-10-CM | POA: Diagnosis not present

## 2017-12-03 DIAGNOSIS — N170 Acute kidney failure with tubular necrosis: Secondary | ICD-10-CM | POA: Diagnosis present

## 2017-12-03 DIAGNOSIS — G2581 Restless legs syndrome: Secondary | ICD-10-CM | POA: Diagnosis present

## 2017-12-03 DIAGNOSIS — J181 Lobar pneumonia, unspecified organism: Secondary | ICD-10-CM | POA: Diagnosis present

## 2017-12-03 DIAGNOSIS — J9811 Atelectasis: Secondary | ICD-10-CM | POA: Diagnosis not present

## 2017-12-03 DIAGNOSIS — Z7951 Long term (current) use of inhaled steroids: Secondary | ICD-10-CM | POA: Diagnosis not present

## 2017-12-03 DIAGNOSIS — E46 Unspecified protein-calorie malnutrition: Secondary | ICD-10-CM | POA: Diagnosis not present

## 2017-12-03 DIAGNOSIS — E8770 Fluid overload, unspecified: Secondary | ICD-10-CM | POA: Diagnosis present

## 2017-12-03 DIAGNOSIS — E875 Hyperkalemia: Secondary | ICD-10-CM | POA: Diagnosis present

## 2017-12-03 DIAGNOSIS — J449 Chronic obstructive pulmonary disease, unspecified: Secondary | ICD-10-CM | POA: Diagnosis not present

## 2017-12-03 DIAGNOSIS — J9601 Acute respiratory failure with hypoxia: Secondary | ICD-10-CM

## 2017-12-03 DIAGNOSIS — I5030 Unspecified diastolic (congestive) heart failure: Secondary | ICD-10-CM | POA: Diagnosis not present

## 2017-12-03 DIAGNOSIS — R410 Disorientation, unspecified: Secondary | ICD-10-CM | POA: Diagnosis present

## 2017-12-03 DIAGNOSIS — N179 Acute kidney failure, unspecified: Secondary | ICD-10-CM | POA: Diagnosis not present

## 2017-12-03 DIAGNOSIS — J189 Pneumonia, unspecified organism: Secondary | ICD-10-CM

## 2017-12-03 DIAGNOSIS — R531 Weakness: Secondary | ICD-10-CM | POA: Diagnosis not present

## 2017-12-03 DIAGNOSIS — R0602 Shortness of breath: Secondary | ICD-10-CM | POA: Diagnosis not present

## 2017-12-03 LAB — CBC
HCT: 37.5 % (ref 35.0–47.0)
HEMOGLOBIN: 12.3 g/dL (ref 12.0–16.0)
MCH: 31.4 pg (ref 26.0–34.0)
MCHC: 32.9 g/dL (ref 32.0–36.0)
MCV: 95.4 fL (ref 80.0–100.0)
Platelets: 194 10*3/uL (ref 150–440)
RBC: 3.93 MIL/uL (ref 3.80–5.20)
RDW: 15.4 % — ABNORMAL HIGH (ref 11.5–14.5)
WBC: 14.1 10*3/uL — ABNORMAL HIGH (ref 3.6–11.0)

## 2017-12-03 LAB — COMPREHENSIVE METABOLIC PANEL
ALBUMIN: 3 g/dL — AB (ref 3.5–5.0)
ALK PHOS: 61 U/L (ref 38–126)
ALT: 14 U/L (ref 0–44)
AST: 24 U/L (ref 15–41)
Anion gap: 7 (ref 5–15)
BILIRUBIN TOTAL: 0.7 mg/dL (ref 0.3–1.2)
BUN: 53 mg/dL — AB (ref 8–23)
CALCIUM: 8.7 mg/dL — AB (ref 8.9–10.3)
CO2: 25 mmol/L (ref 22–32)
Chloride: 102 mmol/L (ref 98–111)
Creatinine, Ser: 1.51 mg/dL — ABNORMAL HIGH (ref 0.44–1.00)
GFR calc Af Amer: 37 mL/min — ABNORMAL LOW (ref >60)
GFR, EST NON AFRICAN AMERICAN: 32 mL/min — AB (ref >60)
GLUCOSE: 110 mg/dL — AB (ref 70–99)
Potassium: 5.3 mmol/L — ABNORMAL HIGH (ref 3.5–5.1)
Sodium: 134 mmol/L — ABNORMAL LOW (ref 135–145)
TOTAL PROTEIN: 5.6 g/dL — AB (ref 6.5–8.1)

## 2017-12-03 LAB — BLOOD GAS, VENOUS
ACID-BASE EXCESS: 0.6 mmol/L (ref 0.0–2.0)
Bicarbonate: 28.4 mmol/L — ABNORMAL HIGH (ref 20.0–28.0)
O2 Saturation: 67.9 %
PH VEN: 7.29 (ref 7.250–7.430)
Patient temperature: 37
pCO2, Ven: 59 mmHg (ref 44.0–60.0)
pO2, Ven: 40 mmHg (ref 32.0–45.0)

## 2017-12-03 LAB — DIFFERENTIAL
Basophils Absolute: 0 10*3/uL (ref 0–0.1)
Basophils Relative: 0 %
EOS PCT: 0 %
Eosinophils Absolute: 0 10*3/uL (ref 0–0.7)
LYMPHS ABS: 0.3 10*3/uL — AB (ref 1.0–3.6)
LYMPHS PCT: 2 %
MONO ABS: 0.8 10*3/uL (ref 0.2–0.9)
MONOS PCT: 6 %
Neutro Abs: 12.9 10*3/uL — ABNORMAL HIGH (ref 1.4–6.5)
Neutrophils Relative %: 92 %

## 2017-12-03 LAB — URINALYSIS, COMPLETE (UACMP) WITH MICROSCOPIC
BACTERIA UA: NONE SEEN
Bilirubin Urine: NEGATIVE
Glucose, UA: NEGATIVE mg/dL
HGB URINE DIPSTICK: NEGATIVE
KETONES UR: NEGATIVE mg/dL
Leukocytes, UA: NEGATIVE
Nitrite: NEGATIVE
PROTEIN: NEGATIVE mg/dL
Specific Gravity, Urine: 1.016 (ref 1.005–1.030)
pH: 5 (ref 5.0–8.0)

## 2017-12-03 LAB — TROPONIN I
TROPONIN I: 0.06 ng/mL — AB (ref <0.03)
TROPONIN I: 0.05 ng/mL — AB (ref <0.03)
Troponin I: 0.05 ng/mL (ref <0.03)

## 2017-12-03 LAB — LACTIC ACID, PLASMA
LACTIC ACID, VENOUS: 0.8 mmol/L (ref 0.5–1.9)
Lactic Acid, Venous: 1.5 mmol/L (ref 0.5–1.9)
Lactic Acid, Venous: 1.7 mmol/L (ref 0.5–1.9)

## 2017-12-03 LAB — MRSA PCR SCREENING: MRSA BY PCR: NEGATIVE

## 2017-12-03 LAB — APTT: aPTT: <24 seconds — ABNORMAL LOW (ref 24–36)

## 2017-12-03 LAB — TSH: TSH: 0.771 u[IU]/mL (ref 0.350–4.500)

## 2017-12-03 LAB — LIPASE, BLOOD: LIPASE: 22 U/L (ref 11–51)

## 2017-12-03 LAB — PROTIME-INR
INR: 0.93
Prothrombin Time: 12.4 seconds (ref 11.4–15.2)

## 2017-12-03 LAB — PROCALCITONIN: Procalcitonin: 2.34 ng/mL

## 2017-12-03 MED ORDER — GABAPENTIN 300 MG PO CAPS
600.0000 mg | ORAL_CAPSULE | Freq: Every day | ORAL | Status: DC
  Administered 2017-12-03 – 2017-12-05 (×3): 600 mg via ORAL
  Filled 2017-12-03 (×3): qty 2

## 2017-12-03 MED ORDER — OXYBUTYNIN CHLORIDE ER 10 MG PO TB24
10.0000 mg | ORAL_TABLET | Freq: Every day | ORAL | Status: DC
  Administered 2017-12-03 – 2017-12-05 (×3): 10 mg via ORAL
  Filled 2017-12-03 (×5): qty 1

## 2017-12-03 MED ORDER — CALCIUM CARBONATE-VITAMIN D 500-200 MG-UNIT PO TABS
1.0000 | ORAL_TABLET | Freq: Two times a day (BID) | ORAL | Status: DC
  Administered 2017-12-03 – 2017-12-06 (×6): 1 via ORAL
  Filled 2017-12-03 (×6): qty 1

## 2017-12-03 MED ORDER — RISPERIDONE 1 MG PO TABS
1.0000 mg | ORAL_TABLET | Freq: Every day | ORAL | Status: DC
  Administered 2017-12-03 – 2017-12-05 (×3): 1 mg via ORAL
  Filled 2017-12-03 (×4): qty 1

## 2017-12-03 MED ORDER — ONDANSETRON HCL 4 MG PO TABS: 4.0000 mg | ORAL_TABLET | Freq: Four times a day (QID) | ORAL | Status: DC | PRN

## 2017-12-03 MED ORDER — SODIUM CHLORIDE 0.9 % IV BOLUS (SEPSIS)
1000.0000 mL | Freq: Once | INTRAVENOUS | Status: AC
  Administered 2017-12-03: 1000 mL via INTRAVENOUS

## 2017-12-03 MED ORDER — HYDROXYZINE HCL 25 MG PO TABS
25.0000 mg | ORAL_TABLET | Freq: Three times a day (TID) | ORAL | Status: DC | PRN
  Administered 2017-12-04 – 2017-12-05 (×2): 25 mg via ORAL
  Filled 2017-12-03 (×3): qty 1

## 2017-12-03 MED ORDER — TIZANIDINE HCL 4 MG PO TABS
4.0000 mg | ORAL_TABLET | Freq: Three times a day (TID) | ORAL | Status: DC | PRN
  Filled 2017-12-03: qty 1

## 2017-12-03 MED ORDER — GABAPENTIN 100 MG PO CAPS
100.0000 mg | ORAL_CAPSULE | Freq: Two times a day (BID) | ORAL | Status: DC
  Administered 2017-12-04 – 2017-12-06 (×5): 100 mg via ORAL
  Filled 2017-12-03 (×5): qty 1

## 2017-12-03 MED ORDER — MOMETASONE FURO-FORMOTEROL FUM 200-5 MCG/ACT IN AERO
2.0000 | INHALATION_SPRAY | Freq: Two times a day (BID) | RESPIRATORY_TRACT | Status: DC
  Administered 2017-12-04 – 2017-12-06 (×5): 2 via RESPIRATORY_TRACT
  Filled 2017-12-03: qty 8.8

## 2017-12-03 MED ORDER — VANCOMYCIN HCL 10 G IV SOLR
1250.0000 mg | INTRAVENOUS | Status: DC
Start: 2017-12-03 — End: 2017-12-04
  Administered 2017-12-03: 1250 mg via INTRAVENOUS
  Filled 2017-12-03 (×3): qty 1250

## 2017-12-03 MED ORDER — GABAPENTIN 100 MG PO CAPS: 100.0000 mg | ORAL_CAPSULE | Freq: Two times a day (BID) | ORAL | Status: DC

## 2017-12-03 MED ORDER — OMEGA-3-ACID ETHYL ESTERS 1 G PO CAPS
1.0000 g | ORAL_CAPSULE | Freq: Every day | ORAL | Status: DC
  Administered 2017-12-04 – 2017-12-06 (×3): 1 g via ORAL
  Filled 2017-12-03 (×3): qty 1

## 2017-12-03 MED ORDER — MAGNESIUM OXIDE 400 (241.3 MG) MG PO TABS
400.0000 mg | ORAL_TABLET | Freq: Two times a day (BID) | ORAL | Status: DC
  Administered 2017-12-03 – 2017-12-06 (×6): 400 mg via ORAL
  Filled 2017-12-03 (×6): qty 1

## 2017-12-03 MED ORDER — THEOPHYLLINE ER 100 MG PO TB12
200.0000 mg | ORAL_TABLET | Freq: Two times a day (BID) | ORAL | Status: DC
  Filled 2017-12-03: qty 2
  Filled 2017-12-03 (×3): qty 1

## 2017-12-03 MED ORDER — BUSPIRONE HCL 10 MG PO TABS
10.0000 mg | ORAL_TABLET | Freq: Three times a day (TID) | ORAL | Status: DC
  Administered 2017-12-03 – 2017-12-06 (×8): 10 mg via ORAL
  Filled 2017-12-03 (×11): qty 1

## 2017-12-03 MED ORDER — SODIUM CHLORIDE 0.9 % IV SOLN
INTRAVENOUS | Status: DC
  Administered 2017-12-03 – 2017-12-04 (×2): via INTRAVENOUS

## 2017-12-03 MED ORDER — SODIUM CHLORIDE 0.9 % IV SOLN
2.0000 g | Freq: Once | INTRAVENOUS | Status: AC
  Administered 2017-12-03: 2 g via INTRAVENOUS
  Filled 2017-12-03: qty 2

## 2017-12-03 MED ORDER — FLUTICASONE PROPIONATE 50 MCG/ACT NA SUSP
1.0000 | Freq: Every day | NASAL | Status: DC
  Administered 2017-12-04 – 2017-12-06 (×3): 1 via NASAL
  Filled 2017-12-03: qty 16

## 2017-12-03 MED ORDER — ENOXAPARIN SODIUM 40 MG/0.4ML ~~LOC~~ SOLN
40.0000 mg | Freq: Two times a day (BID) | SUBCUTANEOUS | Status: DC
  Administered 2017-12-03 – 2017-12-06 (×6): 40 mg via SUBCUTANEOUS
  Filled 2017-12-03 (×6): qty 0.4

## 2017-12-03 MED ORDER — THEOPHYLLINE ER 100 MG PO CP24
200.0000 mg | ORAL_CAPSULE | Freq: Two times a day (BID) | ORAL | Status: DC
  Administered 2017-12-03 – 2017-12-05 (×5): 200 mg via ORAL
  Filled 2017-12-03 (×6): qty 2

## 2017-12-03 MED ORDER — ADULT MULTIVITAMIN W/MINERALS CH
1.0000 | ORAL_TABLET | Freq: Every day | ORAL | Status: DC
  Administered 2017-12-04 – 2017-12-06 (×3): 1 via ORAL
  Filled 2017-12-03 (×3): qty 1

## 2017-12-03 MED ORDER — DIVALPROEX SODIUM 250 MG PO DR TAB
250.0000 mg | DELAYED_RELEASE_TABLET | Freq: Two times a day (BID) | ORAL | Status: DC
  Administered 2017-12-03 – 2017-12-06 (×6): 250 mg via ORAL
  Filled 2017-12-03 (×8): qty 1

## 2017-12-03 MED ORDER — SODIUM CHLORIDE 0.9 % IV SOLN
2.0000 g | Freq: Two times a day (BID) | INTRAVENOUS | Status: DC
  Administered 2017-12-03 – 2017-12-06 (×6): 2 g via INTRAVENOUS
  Filled 2017-12-03 (×8): qty 2

## 2017-12-03 MED ORDER — MONTELUKAST SODIUM 10 MG PO TABS
10.0000 mg | ORAL_TABLET | Freq: Every day | ORAL | Status: DC
  Administered 2017-12-04 – 2017-12-06 (×3): 10 mg via ORAL
  Filled 2017-12-03 (×3): qty 1

## 2017-12-03 MED ORDER — LORATADINE 10 MG PO TABS
10.0000 mg | ORAL_TABLET | Freq: Every day | ORAL | Status: DC
  Administered 2017-12-04 – 2017-12-06 (×3): 10 mg via ORAL
  Filled 2017-12-03 (×3): qty 1

## 2017-12-03 MED ORDER — VANCOMYCIN HCL IN DEXTROSE 1-5 GM/200ML-% IV SOLN
1000.0000 mg | Freq: Once | INTRAVENOUS | Status: AC
  Administered 2017-12-03: 1000 mg via INTRAVENOUS
  Filled 2017-12-03: qty 200

## 2017-12-03 MED ORDER — MELATONIN 5 MG PO TABS
10.0000 mg | ORAL_TABLET | Freq: Every evening | ORAL | Status: DC | PRN
  Administered 2017-12-04 – 2017-12-05 (×2): 10 mg via ORAL
  Filled 2017-12-03 (×3): qty 2

## 2017-12-03 MED ORDER — ONDANSETRON HCL 4 MG/2ML IJ SOLN: 4.0000 mg | Freq: Four times a day (QID) | INTRAMUSCULAR | Status: DC | PRN

## 2017-12-03 MED ORDER — ASPIRIN EC 325 MG PO TBEC
325.0000 mg | DELAYED_RELEASE_TABLET | Freq: Every day | ORAL | Status: DC
  Administered 2017-12-04 – 2017-12-06 (×3): 325 mg via ORAL
  Filled 2017-12-03 (×3): qty 1

## 2017-12-03 MED ORDER — METHYLPREDNISOLONE SODIUM SUCC 40 MG IJ SOLR
40.0000 mg | Freq: Four times a day (QID) | INTRAMUSCULAR | Status: DC
  Administered 2017-12-03 – 2017-12-05 (×8): 40 mg via INTRAVENOUS
  Filled 2017-12-03 (×8): qty 1

## 2017-12-03 MED ORDER — IPRATROPIUM-ALBUTEROL 0.5-2.5 (3) MG/3ML IN SOLN: 3.0000 mL | Freq: Four times a day (QID) | RESPIRATORY_TRACT | Status: DC | PRN

## 2017-12-03 MED ORDER — NOREPINEPHRINE 4 MG/250ML-% IV SOLN
0.0000 ug/min | INTRAVENOUS | Status: DC
  Administered 2017-12-03: 5 ug/min via INTRAVENOUS
  Filled 2017-12-03: qty 250

## 2017-12-03 NOTE — ED Triage Notes (Signed)
Pt ems from home for altered mental status that per ems was noticed when pt woke up this am. Ems reports temp 102.7 ax, hr 125, decreased loc

## 2017-12-03 NOTE — H&P (Signed)
[facility] NAME: Vicki Morales    MR#:  [id number]  DATE OF BIRTH:  1940/10/06  DATE OF ADMISSION:  12/03/2017  PRIMARY CARE PHYSICIAN: Birdie Sons, MD   REQUESTING/REFERRING PHYSICIAN: Dr. Carrie Mew  CHIEF COMPLAINT:   Chief Complaint  Patient presents with  . Altered Mental Status    HISTORY OF PRESENT ILLNESS:  Vicki Morales  is a 77 y.o. female with a known history of COPD not on home oxygen, hypertension, major depression and anxiety requiring Geri psych hospitalization last week, arthritis with bilateral knee pain presents to hospital from home secondary to worsening shortness of breath and fevers. Patient is confused today, very sleepy.  Daughter at bedside provides most of the history.  According to daughter patient psych meds have been changed in February of this year since then her depression was hard to control.  She was evaluated in the emergency room here for the same symptoms last week.  She was admitted to [facility] in [place] for 3 days.  Just discharged home 3 days ago.  She started having some cough, no fevers, no nausea or vomiting.  Appetite has been good.  Since yesterday daughter noticed that her cough was more thicker.  This morning, patient could not breathe and had some gurgling noise in her throat and so brought to the hospital.  She was hypoxic here on room air and is currently on 2 and half liters oxygen.  Chest x-ray with multilobar right-sided pneumonia.  PAST MEDICAL HISTORY:   Past Medical History:  Diagnosis Date  . Anxiety   . Arthritis   . Cataract    right eye  . COPD (chronic obstructive pulmonary disease) ([place])   . Depression   . Dyspnea    DOE  . Dysrhythmia   . Edema    FEET/ANKLES  . GERD (gastroesophageal reflux disease)   . H/O wheezing   . History of hiatal hernia   . HOH (hard of hearing)   . Hypertension   . Incontinence of urine in female   .  Orthopnea   . Pain    CHRONIC KNEE  . Pain    CHRONIC KNEE  . Paranoid disorder ([place])   . RLS (restless legs syndrome)   . Tremors of nervous system    HANDS    PAST SURGICAL HISTORY:   Past Surgical History:  Procedure Laterality Date  . ABDOMINAL HYSTERECTOMY  1988  . APPENDECTOMY  1988  . CARDIAC CATHETERIZATION    . CATARACT EXTRACTION W/PHACO Right 07/14/2016   Procedure: CATARACT EXTRACTION PHACO AND INTRAOCULAR LENS PLACEMENT ([place]) anterior vitrectomy;  Surgeon: Estill Cotta, MD;  Location: [facility] ORS;  Service: Ophthalmology;  Laterality: Right;  [place] 02:40 AP% 24.4 CDE 59.98 Fluid pack # [id number]  . CATARACT EXTRACTION W/PHACO Left 02/16/2017   Procedure: CATARACT EXTRACTION PHACO AND INTRAOCULAR LENS PLACEMENT (IOC);  Surgeon: Estill Cotta, MD;  Location: [facility] ORS;  Service: Ophthalmology;  Laterality: Left;  Lot # [id number] H [place]: 01:11.8 AP%: 23.5 CDE: 32.09   . ESOPHAGOGASTRODUODENOSCOPY N/A 11/11/2014   Procedure: ESOPHAGOGASTRODUODENOSCOPY (EGD);  Surgeon: Josefine Class, MD;  Location: [facility] ENDOSCOPY;  Service: Endoscopy;  Laterality: N/A;  . TONSILLECTOMY AND ADENOIDECTOMY    . TRIGGER FINGER RELEASE  2004    SOCIAL HISTORY:   Social History   Tobacco Use  . Smoking status: Former Smoker    Years: 30.00  . Smokeless tobacco: Never Used  .  Tobacco comment: quit in 1989  Substance Use Topics  . Alcohol use: No    Alcohol/week: 0.0 oz    FAMILY HISTORY:   Family History  Problem Relation Age of Onset  . Breast cancer Sister   . Stroke Father   . Emphysema Father   . Anxiety disorder Father   . Depression Father   . Anxiety disorder Brother     DRUG ALLERGIES:   Allergies  Allergen Reactions  . Arthrotec [Diclofenac-Misoprostol] Hives  . Codeine Other (See Comments)    Headache   . Diclofenac Other (See Comments) and Hives    Pt unsure allergy  . Hydrochlorothiazide Other (See Comments)    Weakness   . Penicillins Hives    Has  patient had a PCN reaction causing immediate rash, facial/tongue/throat swelling, SOB or lightheadedness with hypotension: No Has patient had a PCN reaction causing severe rash involving mucus membranes or skin necrosis: No Has patient had a PCN reaction that required hospitalization: No Has patient had a PCN reaction occurring within the last 10 years: No If all of the above answers are "NO", then may proceed with Cephalosporin use.  . Sulfa Antibiotics Hives  . Tiotropium Bromide Monohydrate Other (See Comments)    Blurry vision   . Tylenol [Acetaminophen] Other (See Comments)    Doesn't work  . Morphine Hives and Rash  . Pantoprazole Rash    REVIEW OF SYSTEMS:   Review of Systems  Unable to perform ROS: Mental status change    MEDICATIONS AT HOME:   Prior to Admission medications   Medication Sig Start Date End Date Taking? Authorizing Provider  albuterol (PROVENTIL) (2.5 MG/3ML) 0.083% nebulizer solution Take 3 mLs (2.5 mg total) by nebulization 2 (two) times daily as needed for wheezing or shortness of breath. 09/22/16  Yes Birdie Sons, MD  aspirin EC 325 MG tablet Take 1 tablet (325 mg total) by mouth daily. Patient taking differently: Take 325 mg by mouth at bedtime.  09/23/15  Yes Demetrios Loll, MD  benazepril (LOTENSIN) 5 MG tablet TAKE 1 TABLET(5 MG) BY MOUTH DAILY 11/20/17  Yes Birdie Sons, MD  busPIRone (BUSPAR) 10 MG tablet Take 1 tablet (10 mg total) by mouth 3 (three) times daily. 12/02/17  Yes Ursula Alert, MD  Calcium Carbonate-Vitamin D (CALCIUM 600+D) 600-400 MG-UNIT tablet Take 1 tablet by mouth 2 (two) times daily.    Yes [provider]  Cranberry 250 MG CAPS Take 1 capsule by mouth daily.    Yes [provider]  divalproex (DEPAKOTE) 250 MG DR tablet Take 1 tablet (250 mg total) by mouth 2 (two) times daily. 12/02/17  Yes Eappen, Ria Clock, MD  fluticasone (FLONASE) 50 MCG/ACT nasal spray Place 1 spray into both nostrils daily.   Yes  [provider]  Fluticasone-Salmeterol (WIXELA INHUB) 250-50 MCG/DOSE AEPB Inhale 1 puff into the lungs 2 (two) times daily.   Yes [provider]  Fluticasone-Umeclidin-Vilant (TRELEGY ELLIPTA) 100-62.5-25 MCG/INH AEPB Inhale 1 puff into the lungs daily. 11/08/17  Yes Birdie Sons, MD  gabapentin (NEURONTIN) 100 MG capsule TAKE 1 CAPSULE(100 MG) BY MOUTH TWICE DAILY 04/26/17  Yes Birdie Sons, MD  gabapentin (NEURONTIN) 300 MG capsule TAKE 2 CAPSULES(600 MG) BY MOUTH AT BEDTIME 04/26/17  Yes Birdie Sons, MD  hydrOXYzine (ATARAX/VISTARIL) 25 MG tablet TAKE 1 TABLET(25 MG) BY MOUTH EVERY 8 HOURS AS NEEDED FOR ANXIETY 05/08/17  Yes Birdie Sons, MD  lisinopril (PRINIVIL,ZESTRIL) 5 MG tablet Take  1 tablet by mouth daily. 12/01/17  Yes [provider]  loratadine (CLARITIN) 10 MG tablet Take 10 mg by mouth daily.   Yes [provider]  magnesium oxide (MAG-OX) 400 MG tablet Take 400 mg by mouth 2 (two) times daily.   Yes [provider]  Melatonin 10 MG CAPS Take 10 mg by mouth at bedtime as needed.   Yes [provider]  meloxicam (MOBIC) 15 MG tablet TAKE 1 TABLET BY MOUTH EVERY DAY WITH A MEALS   Yes [provider]  Misc Natural Products (OSTEO BI-FLEX ADV DOUBLE ST PO) Take 1 tablet by mouth 2 (two) times daily.    Yes [provider]  montelukast (SINGULAIR) 10 MG tablet TAKE 1 TABLET BY MOUTH EVERY DAY 04/22/16  Yes Birdie Sons, MD  Multiple Vitamin (MULTIVITAMIN WITH MINERALS) TABS tablet Take 1 tablet by mouth daily.   Yes [provider]  Omega-3 Fatty Acids (FISH OIL) 1000 MG CAPS Take 1,000 mg by mouth daily.    Yes [provider]  oxybutynin (DITROPAN-XL) 10 MG 24 hr tablet TAKE 1 TABLET BY MOUTH AT BEDTIME 08/21/17  Yes Birdie Sons, MD  risperiDONE (RISPERDAL) 1 MG tablet Take 1 tablet (1 mg total) by mouth at bedtime. Patient taking differently: Take 2 mg by mouth at bedtime.   12/02/17  Yes Eappen, Ria Clock, MD  theophylline (UNIPHYL) 400 MG 24 hr tablet TAKE 1 TABLET BY MOUTH DAILY. Patient taking differently: TAKE 200MG  BY MOUTH DAILY. 08/22/16  Yes Birdie Sons, MD  tiZANidine (ZANAFLEX) 4 MG tablet Take 1 tablet (4 mg total) by mouth every 8 (eight) hours as needed for muscle spasms. 06/17/17  Yes Birdie Sons, MD  traZODone (DESYREL) 50 MG tablet Take 0.5-1 tablets (25-50 mg total) by mouth at bedtime as needed for sleep. 11/08/17  Yes Ursula Alert, MD  traMADol (ULTRAM) 50 MG tablet Take 2 tablets (100 mg total) by mouth 2 (two) times daily. Patient not taking: Reported on 11/25/2017 03/14/17   Birdie Sons, MD      VITAL SIGNS:  Blood pressure (!) 107/35, pulse 94, temperature (!) 101.4 F (38.6 C), temperature source Rectal, resp. rate 14, height 5\' 4"  (1.626 m), weight 114.8 kg (253 lb), SpO2 94 %.  PHYSICAL EXAMINATION:   Physical Exam  GENERAL:  77 y.o.-year-old patient lying in the bed with no acute distress.  EYES: Pupils equal, round, reactive to light and accommodation. No scleral icterus. Extraocular muscles intact.  HEENT: Head atraumatic, normocephalic. Oropharynx and nasopharynx clear.  NECK:  Supple, no jugular venous distention. No thyroid enlargement, no tenderness.  LUNGS: moving air bilaterally, no wheezing, rales  or crepitation. Coarse rhonchi at right base. No use of accessory muscles of respiration.  CARDIOVASCULAR: S1, S2 normal. No  rubs, or gallops. 2/6 systolic murmur present ABDOMEN: Soft, nontender, nondistended. Bowel sounds present. No organomegaly or mass.  EXTREMITIES: No pedal edema, cyanosis, or clubbing. Both knees are swollen and arthritis changes. NEUROLOGIC: sleepy, able to move all extremities in bed,. Sensation intact. Gait not checked.  PSYCHIATRIC: The patient is sleepy, lethargic, arousable and oriented to self.  SKIN: No obvious rash, lesion, or ulcer.   LABORATORY PANEL:   CBC Recent Labs  Lab  12/03/17 0914  WBC 14.1*  HGB 12.3  HCT 37.5  PLT 194   ------------------------------------------------------------------------------------------------------------------  Chemistries  Recent Labs  Lab 12/03/17 0813  NA 134*  K 5.3*  CL 102  CO2 25  GLUCOSE  110*  BUN 53*  CREATININE 1.51*  CALCIUM 8.7*  AST 24  ALT 14  ALKPHOS 61  BILITOT 0.7   ------------------------------------------------------------------------------------------------------------------  Cardiac Enzymes Recent Labs  Lab 12/03/17 0813  TROPONINI 0.05*   ------------------------------------------------------------------------------------------------------------------  RADIOLOGY:  Ct Head Wo Contrast  Result Date: 12/03/2017 CLINICAL DATA:  Patient with generalized weakness and altered mental status. EXAM: CT HEAD WITHOUT CONTRAST TECHNIQUE: Contiguous axial images were obtained from the base of the skull through the vertex without intravenous contrast. COMPARISON:  Brain CT 09/22/2015. FINDINGS: Brain: Ventricles and sulci are appropriate for patient's age. No evidence for acute cortically based infarct, intracranial hemorrhage, mass lesion or mass-effect. Vascular: Internal carotid arterial vascular calcifications. Skull: Intact. Sinuses/Orbits: Paranasal sinuses are well aerated. Mastoid air cells are unremarkable. Other: None. IMPRESSION: No acute intracranial process. Electronically Signed   By: Lovey Newcomer M.D.   On: 12/03/2017 10:36   Dg Chest Port 1 View  Result Date: 12/03/2017 CLINICAL DATA:  Dyspnea.  Bibasilar crackles.  Ex-smoker. EXAM: PORTABLE CHEST 1 VIEW COMPARISON:  11/24/2017. FINDINGS: Mildly enlarged cardiac silhouette with a mild increase in size. The pulmonary vasculature and interstitial markings remain prominent. Interval patchy opacity in the right upper, mid and lower lung zones. Minimal left basilar atelectasis. Thoracic spine degenerative changes and mild bilateral shoulder  degenerative changes. Diffuse osteopenia. Calcified mediastinal lymph nodes without significant change. IMPRESSION: 1. Interval multilobar pneumonia on the right. 2. Minimal left basilar atelectasis. 3. Mild cardiomegaly, mild pulmonary vascular congestion and mild chronic interstitial lung disease. Electronically Signed   By: Claudie Revering M.D.   On: 12/03/2017 08:31    EKG:   Orders placed or performed during the hospital encounter of 12/03/17  . EKG 12-Lead  . EKG 12-Lead  . EKG 12-Lead  . EKG 12-Lead    IMPRESSION AND PLAN:   Airyonna Franklyn  is a 77 y.o. female with a known history of COPD not on home oxygen, hypertension, major depression and anxiety requiring Geri psych hospitalization last week, arthritis with bilateral knee pain presents to hospital from home secondary to worsening shortness of breath and fevers.  1.  Sepsis-we will treat as healthcare acquired pneumonia due to recent hospitalization. -Follow blood cultures.  Chest x-ray with multifocal right-sided pneumonia -Started on vancomycin and cefepime.  Check MRSA PCR -Continue oxygen support for now -Blood pressure is low normal.  Hold blood pressure medications.  IV fluids. -Follow-up lactic acid  2.  Acute hypoxic respiratory failure-secondary to multifocal pneumonia. -Currently requiring 2-1/2 L oxygen.  Not on any home O2. -Wean as tolerated  3.  COPD-stable.  No indication for systemic steroids -Continue inhalers and nebulizers  4.  Acute renal failure-ATN from sepsis and prerenal causes.  Increased BUN and creatinine.  Hold nephrotoxins -Gentle hydration and monitor  5.  Hyperkalemia-hold both lisinopril and benazepril for now.  6.  Depression anxiety-stable for now.  No active suicidal ideation.  Continue home medications.  7.  DVT prophylaxis-Lovenox  Physical therapy consulted    All the records are reviewed and case discussed with ED provider. Management plans discussed with the patient, family  and they are in agreement.  CODE STATUS: DNR  TOTAL TIME TAKING CARE OF THIS PATIENT: 50 minutes.    Gladstone Lighter M.D on 12/03/2017 at 11:25 AM  Between 7am to 6pm - Pager - [phone]  After 6pm go to [email or web address] - password [email or web address] [facility]  Office  [phone]  CC: Primary care physician; Birdie Sons, MD

## 2017-12-03 NOTE — Progress Notes (Signed)
Pt rapid response from the floor for reported hypotension.  Pt lethargic, appears to answer some questions correctly.  BP found to be 144/127.  NS bolus stopped at this time.  Her spo2 dips intot he upper 80's on 2l o2 via [place] however she appears to be mouth breathing.

## 2017-12-03 NOTE — Consult Note (Signed)
Pharmacy Antibiotic Note  Vicki Morales is a 77 y.o. female admitted on 12/03/2017 with pneumonia.  Pharmacy has been consulted for cefepime and vancomycin dosing.  Plan: Vancomycin 1g given in the ED. Will give next dose in 11 hours for stacked dosing. Vancomycin 1250mg  IV every 24 hours.  Goal trough 15-20 mcg/mL. Trough prior to the 5th dose Check MRSA PCR Cefepime 2g q 12 hours  Height: 5\' 4"  (162.6 cm) Weight: 253 lb (114.8 kg) IBW/kg (Calculated) : 54.7  Temp (24hrs), Avg:101.4 F (38.6 C), Min:101.4 F (38.6 C), Max:101.4 F (38.6 C)  Recent Labs  Lab 12/03/17 0813 12/03/17 0914  WBC  --  14.1*  CREATININE 1.51*  --   LATICACIDVEN 1.5  --     Estimated Creatinine Clearance: 38.1 mL/min (A) (by C-G formula based on SCr of 1.51 mg/dL (H)).    Allergies  Allergen Reactions  . Arthrotec [Diclofenac-Misoprostol] Hives  . Codeine Other (See Comments)    Headache   . Diclofenac Other (See Comments) and Hives    Pt unsure allergy  . Hydrochlorothiazide Other (See Comments)    Weakness   . Penicillins Hives    Has patient had a PCN reaction causing immediate rash, facial/tongue/throat swelling, SOB or lightheadedness with hypotension: No Has patient had a PCN reaction causing severe rash involving mucus membranes or skin necrosis: No Has patient had a PCN reaction that required hospitalization: No Has patient had a PCN reaction occurring within the last 10 years: No If all of the above answers are "NO", then may proceed with Cephalosporin use.  . Sulfa Antibiotics Hives  . Tiotropium Bromide Monohydrate Other (See Comments)    Blurry vision   . Tylenol [Acetaminophen] Other (See Comments)    Doesn't work  . Morphine Hives and Rash  . Pantoprazole Rash    Antimicrobials this admission: vancomycin 6/29 >>  cefepime 6/29 >>   Dose adjustments this admission:   Microbiology results: 6/29 BCx:  6/29 UCx:   6/29 MRSA PCR:   Thank you for allowing pharmacy to  be a part of this patient's care.  Ramond Dial, Pharm.D, BCPS Clinical Pharmacist 12/03/2017 12:24 PM

## 2017-12-03 NOTE — Progress Notes (Signed)
Patient was alert to self, when she reached the floor her blood pressure was low with systolic in the 16D Pressure response was called.  She already received 2 L fluid bolus in the ED.  Fluids were started -Transferred to stepdown for Levophed

## 2017-12-03 NOTE — Progress Notes (Signed)
lovenox changed to 40 BID due to BMI >40  Margaux Engen D Ryott Rafferty, Pharm.D, BCPS Clinical Pharmacist

## 2017-12-03 NOTE — Progress Notes (Addendum)
Pt received from floor. Vital signs assessed. Pt is alert to self only, slurring words. Pt will not open her eyes. BP checked x2, 60's/20's. Rapid response called. MD at bedside shortly after with RR RN and CN. Pt transferred to Stepdown at that time.   [place] Notified pt's daughter [name] via telephone of pt's transfer. Gave new location of pt. [name] stated she would come back to hospital.

## 2017-12-03 NOTE — Progress Notes (Signed)
CODE SEPSIS - PHARMACY COMMUNICATION  **Broad Spectrum Antibiotics should be administered within 1 hour of Sepsis diagnosis**  Time Code Sepsis Called/Page Received: 0809  Antibiotics Ordered: cefepime/vanconmycin  Time of 1st antibiotic administration: 0826  Additional action taken by pharmacy:   If necessary, Name of Provider/Nurse Contacted:     Ramond Dial ,PharmD Clinical Pharmacist  12/03/2017  8:09 AM

## 2017-12-03 NOTE — Consult Note (Signed)
Reason for Consult:rapid response for hypotension Referring Physician: Dr. Rex Kras is an 77 y.o. female.  HPI: Vicki Morales was 77 year old female with a past medical history remarkable for hypertension, gastroesophageal reflux disease, COPD, extensive psychiatric history to include depression/anxiety/paranoid disorder per chart, required [facility] psych hospitalization last week, arthritis with bilateral knee pain presents to hospital from home secondary to worsening shortness of breath and fevers. Patient was altered this morning.She started having some cough, no fevers, no nausea or vomiting. Since yesterday daughter noticed that her cough was more thicker.  This morning, patient could not breathe and had some gurgling noise in her throat and so brought to the hospital.  She was hypoxic here on room air and is currently on 2 and half liters oxygen.  Chest x-ray with multilobar right-sided pneumonia.upon admission to the floor she was found to be hypotensive and a rapid response was called she was brought to the intensive care unit. Upon arrival her blood pressure was 144/100. Her oxygen saturations were 88% on nasal cannula oxygen  Past Medical History:  Diagnosis Date  . Anxiety   . Arthritis   . Cataract    right eye  . COPD (chronic obstructive pulmonary disease) ([place])   . Depression   . Dyspnea    DOE  . Dysrhythmia   . Edema    FEET/ANKLES  . GERD (gastroesophageal reflux disease)   . H/O wheezing   . History of hiatal hernia   . HOH (hard of hearing)   . Hypertension   . Incontinence of urine in female   . Orthopnea   . Pain    CHRONIC KNEE  . Pain    CHRONIC KNEE  . Paranoid disorder ([place])   . RLS (restless legs syndrome)   . Tremors of nervous system    HANDS    Past Surgical History:  Procedure Laterality Date  . ABDOMINAL HYSTERECTOMY  1988  . APPENDECTOMY  1988  . CARDIAC CATHETERIZATION    . CATARACT EXTRACTION W/PHACO Right 07/14/2016   Procedure:  CATARACT EXTRACTION PHACO AND INTRAOCULAR LENS PLACEMENT ([place]) anterior vitrectomy;  Surgeon: Estill Cotta, MD;  Location: [facility] ORS;  Service: Ophthalmology;  Laterality: Right;  [place] 02:40 AP% 24.4 CDE 59.98 Fluid pack # [id number]  . CATARACT EXTRACTION W/PHACO Left 02/16/2017   Procedure: CATARACT EXTRACTION PHACO AND INTRAOCULAR LENS PLACEMENT (IOC);  Surgeon: Estill Cotta, MD;  Location: [facility] ORS;  Service: Ophthalmology;  Laterality: Left;  Lot # [id number] H [place]: 01:11.8 AP%: 23.5 CDE: 32.09   . ESOPHAGOGASTRODUODENOSCOPY N/A 11/11/2014   Procedure: ESOPHAGOGASTRODUODENOSCOPY (EGD);  Surgeon: Josefine Class, MD;  Location: [facility] ENDOSCOPY;  Service: Endoscopy;  Laterality: N/A;  . TONSILLECTOMY AND ADENOIDECTOMY    . TRIGGER FINGER RELEASE  2004    Family History  Problem Relation Age of Onset  . Breast cancer Sister   . Stroke Father   . Emphysema Father   . Anxiety disorder Father   . Depression Father   . Anxiety disorder Brother     Social History:  reports that she has quit smoking. She quit after 30.00 years of use. She has never used smokeless tobacco. She reports that she does not drink alcohol or use drugs.  Allergies:  Allergies  Allergen Reactions  . Arthrotec [Diclofenac-Misoprostol] Hives  . Codeine Other (See Comments)    Headache   . Diclofenac Other (See Comments) and Hives    Pt unsure allergy  . Hydrochlorothiazide Other (See Comments)  Weakness   . Penicillins Hives    Has patient had a PCN reaction causing immediate rash, facial/tongue/throat swelling, SOB or lightheadedness with hypotension: No Has patient had a PCN reaction causing severe rash involving mucus membranes or skin necrosis: No Has patient had a PCN reaction that required hospitalization: No Has patient had a PCN reaction occurring within the last 10 years: No If all of the above answers are "NO", then may proceed with Cephalosporin use.  . Sulfa Antibiotics Hives  . Tiotropium  Bromide Monohydrate Other (See Comments)    Blurry vision   . Tylenol [Acetaminophen] Other (See Comments)    Doesn't work  . Morphine Hives and Rash  . Pantoprazole Rash    Medications: I have reviewed the patient's current medications.  Results for orders placed or performed during the hospital encounter of 12/03/17 (from the past 48 hour(s))  Lactic acid, plasma     Status: None   Collection Time: 12/03/17  8:13 AM  Result Value Ref Range   Lactic Acid, Venous 1.5 0.5 - 1.9 mmol/L    Comment: Performed at [facility], [address] [place], [place] [address]  Comprehensive metabolic panel     Status: Abnormal   Collection Time: 12/03/17  8:13 AM  Result Value Ref Range   Sodium 134 (L) 135 - 145 mmol/L   Potassium 5.3 (H) 3.5 - 5.1 mmol/L   Chloride 102 98 - 111 mmol/L    Comment: Please note change in reference range.   CO2 25 22 - 32 mmol/L   Glucose, Bld 110 (H) 70 - 99 mg/dL    Comment: Please note change in reference range.   BUN 53 (H) 8 - 23 mg/dL    Comment: Please note change in reference range.   Creatinine, Ser 1.51 (H) 0.44 - 1.00 mg/dL   Calcium 8.7 (L) 8.9 - 10.3 mg/dL   Total Protein 5.6 (L) 6.5 - 8.1 g/dL   Albumin 3.0 (L) 3.5 - 5.0 g/dL   AST 24 15 - 41 U/L   ALT 14 0 - 44 U/L    Comment: Please note change in reference range.   Alkaline Phosphatase 61 38 - 126 U/L   Total Bilirubin 0.7 0.3 - 1.2 mg/dL   GFR calc non Af Amer 32 (L) >60 mL/min   GFR calc Af Amer 37 (L) >60 mL/min    Comment: (NOTE) The eGFR has been calculated using the CKD EPI equation. This calculation has not been validated in all clinical situations. eGFR's persistently <60 mL/min signify possible Chronic Kidney Disease.    Anion gap 7 5 - 15    Comment: Performed at [facility], [address] [place], [place] [address]  Lipase, blood     Status: None   Collection Time: 12/03/17  8:13 AM  Result Value Ref Range   Lipase 22 11 - 51 U/L    Comment:  Performed at [facility], [address] [place], [place] [address]  Troponin I     Status: Abnormal   Collection Time: 12/03/17  8:13 AM  Result Value Ref Range   Troponin I 0.05 (HH) <0.03 ng/mL    Comment: CRITICAL RESULT CALLED TO, READ BACK BY AND VERIFIED WITH BILL SMITH 12/03/17 AT [facility] Performed at [facility], [address], [place], [place] [address]   Procalcitonin     Status: None   Collection Time: 12/03/17  8:13 AM  Result Value Ref Range  Procalcitonin 2.34 ng/mL    Comment:        Interpretation: PCT > 2 ng/mL: Systemic infection (sepsis) is likely, unless other causes are known. (NOTE)       Sepsis PCT Algorithm           Lower Respiratory Tract                                      Infection PCT Algorithm    ----------------------------     ----------------------------         PCT < 0.25 ng/mL                PCT < 0.10 ng/mL         Strongly encourage             Strongly discourage   discontinuation of antibiotics    initiation of antibiotics    ----------------------------     -----------------------------       PCT 0.25 - 0.50 ng/mL            PCT 0.10 - 0.25 ng/mL               OR       >80% decrease in PCT            Discourage initiation of                                            antibiotics      Encourage discontinuation           of antibiotics    ----------------------------     -----------------------------         PCT >= 0.50 ng/mL              PCT 0.26 - 0.50 ng/mL               AND       <80% decrease in PCT              Encourage initiation of                                             antibiotics       Encourage continuation           of antibiotics    ----------------------------     -----------------------------        PCT >= 0.50 ng/mL                  PCT > 0.50 ng/mL               AND         increase in PCT                  Strongly encourage                                      initiation of  antibiotics    Strongly encourage escalation           of antibiotics                                     -----------------------------  PCT <= 0.25 ng/mL                                                 OR                                        > 80% decrease in PCT                                     Discontinue / Do not initiate                                             antibiotics Performed at [facility], [address] [place], [place] [address]   Urinalysis, Complete w Microscopic     Status: Abnormal   Collection Time: 12/03/17  8:13 AM  Result Value Ref Range   Color, Urine YELLOW (A) YELLOW   APPearance CLEAR (A) CLEAR   Specific Gravity, Urine 1.016 1.005 - 1.030   pH 5.0 5.0 - 8.0   Glucose, UA NEGATIVE NEGATIVE mg/dL   Hgb urine dipstick NEGATIVE NEGATIVE   Bilirubin Urine NEGATIVE NEGATIVE   Ketones, ur NEGATIVE NEGATIVE mg/dL   Protein, ur NEGATIVE NEGATIVE mg/dL   Nitrite NEGATIVE NEGATIVE   Leukocytes, UA NEGATIVE NEGATIVE   RBC / HPF 0-5 0 - 5 RBC/hpf   WBC, UA 0-5 0 - 5 WBC/hpf   Bacteria, UA NONE SEEN NONE SEEN   Squamous Epithelial / LPF 0-5 0 - 5   Mucus PRESENT    Hyaline Casts, UA PRESENT    Amorphous Crystal PRESENT     Comment: Performed at [facility], [address] [place], [place] [address]  Blood gas, venous     Status: Abnormal   Collection Time: 12/03/17  8:36 AM  Result Value Ref Range   pH, Ven 7.29 7.250 - 7.430   pCO2, Ven 59 44.0 - 60.0 mmHg   pO2, Ven 40.0 32.0 - 45.0 mmHg   Bicarbonate 28.4 (H) 20.0 - 28.0 mmol/L   Acid-Base Excess 0.6 0.0 - 2.0 mmol/L   O2 Saturation 67.9 %   Patient temperature 37.0    Collection site VEIN    Sample type VENIPUNCTURE     Comment: Performed at [facility], [address] [place], [place] [address]  CBC     Status: Abnormal   Collection Time: 12/03/17  9:14 AM  Result Value Ref Range   WBC 14.1 (H) 3.6 - 11.0 K/uL    RBC 3.93 3.80 - 5.20 MIL/uL   Hemoglobin 12.3 12.0 - 16.0 g/dL   HCT 37.5 35.0 - 47.0 %   MCV 95.4 80.0 - 100.0 fL   MCH 31.4 26.0 - 34.0 pg   MCHC 32.9 32.0 - 36.0 g/dL   RDW 15.4 (H) 11.5 - 14.5 %   Platelets 194 150 - 440 K/uL    Comment: Performed at [facility], [address], [place], [place] [address]  Differential     Status: Abnormal   Collection Time: 12/03/17  9:14 AM  Result Value Ref Range   Neutrophils Relative % 92 %   Neutro Abs 12.9 (H) 1.4 - 6.5 K/uL   Lymphocytes Relative 2 %   Lymphs Abs 0.3 (L) 1.0 - 3.6 K/uL   Monocytes Relative 6 %   Monocytes Absolute 0.8 0.2 - 0.9 K/uL   Eosinophils Relative 0 %   Eosinophils Absolute 0.0 0 - 0.7 K/uL   Basophils Relative 0 %   Basophils Absolute 0.0 0 - 0.1 K/uL    Comment: Performed at [facility], [address] [place], [place] [address]  Protime-INR     Status: None   Collection Time: 12/03/17  9:14 AM  Result Value Ref Range   Prothrombin Time 12.4 11.4 - 15.2 seconds   INR 0.93     Comment: Performed at [facility], [address] [place], [place] [address]  APTT     Status: Abnormal   Collection Time: 12/03/17  9:14 AM  Result Value Ref Range   aPTT <24 (L) 24 - 36 seconds    Comment: Performed at [facility], [address] [place], [place] [address]  Lactic acid, plasma     Status: None   Collection Time: 12/03/17  1:03 PM  Result Value Ref Range   Lactic Acid, Venous 1.7 0.5 - 1.9 mmol/L    Comment: Performed at [facility], [address] [place], [place] [address]    Ct Head Wo Contrast  Result Date: 12/03/2017 CLINICAL DATA:  Patient with generalized weakness and altered mental status. EXAM: CT HEAD WITHOUT CONTRAST TECHNIQUE: Contiguous axial images were obtained from the base of the skull through the vertex without intravenous contrast. COMPARISON:  Brain CT 09/22/2015. FINDINGS: Brain: Ventricles and sulci are appropriate for patient's age.  No evidence for acute cortically based infarct, intracranial hemorrhage, mass lesion or mass-effect. Vascular: Internal carotid arterial vascular calcifications. Skull: Intact. Sinuses/Orbits: Paranasal sinuses are well aerated. Mastoid air cells are unremarkable. Other: None. IMPRESSION: No acute intracranial process. Electronically Signed   By: Lovey Newcomer M.D.   On: 12/03/2017 10:36   Dg Chest Port 1 View  Result Date: 12/03/2017 CLINICAL DATA:  Dyspnea.  Bibasilar crackles.  Ex-smoker. EXAM: PORTABLE CHEST 1 VIEW COMPARISON:  11/24/2017. FINDINGS: Mildly enlarged cardiac silhouette with a mild increase in size. The pulmonary vasculature and interstitial markings remain prominent. Interval patchy opacity in the right upper, mid and lower lung zones. Minimal left basilar atelectasis. Thoracic spine degenerative changes and mild bilateral shoulder degenerative changes. Diffuse osteopenia. Calcified mediastinal lymph nodes without significant change. IMPRESSION: 1. Interval multilobar pneumonia on the right. 2. Minimal left basilar atelectasis. 3. Mild cardiomegaly, mild pulmonary vascular congestion and mild chronic interstitial lung disease. Electronically Signed   By: Claudie Revering M.D.   On: 12/03/2017 08:31    ROS Blood pressure (!) 93/34, pulse 91, temperature 98.2 F (36.8 C), temperature source Axillary, resp. rate 13, height _0  (1.626 m), weight 265 lb 10.5 oz (120.5 kg), SpO2 95 %. Physical Exam  Vital signs: Please see the above listed vital signs HEENT: Patient wearing nasal cannula oxygen, trachea is midline, no accessory muscle utilization noted, no oral lesions appreciated Cardiovascular: Regular rate and rhythm Pulmonary: Prolonged expiratory phase with expiratory rhonchi and rubs appreciated left greater than right Abdominal exam: Positive bowel sounds, mildly distended but soft exam Extremities: 1+ edema appreciated Neurologic: Patient is responsive, answers questions briefly  and said she was tired. Moves all extremities Cutaneous: No rashes or lesions  noted  EKG shows low voltage, leftward axis,poor progression with nonspecific ST-T wave changes  Chest x-ray shows cardiomegaly with right sided multifocal airspace disease  Assessment/Plan:  Altered mental status. CT scan of the head was negative for any acute intracranial abnormalities, some of this may be secondary to sepsis and encephalopathy versus drug effect  Pneumonia / COPD. We'll place on supplemental oxygen to titrate to saturation greater than 90%,  Broad-spectrum antibiotic coverage, albuterol, Atrovent, Solu-Medrol  Leukocytosis. Secondary to pneumonia  Hyponatremia. Saline resuscitation  Hyperkalemia. Repeat BMP and follow-up potassium  Protein calorie malnutrition  Mildly elevated troponin. No acute ischemic disease, most likely reflects supply demand ischemia  Cardiomegaly, will obtain echocardiography to assess LV function  Hermelinda Dellen, DO [place] 12/03/2017, 1:53 PM

## 2017-12-03 NOTE — Progress Notes (Signed)
   [place] [facility] Day: 0 days Vicki Morales is a 77 y.o. female presenting with Altered Mental Status .   Advance care planning discussed with patient's daughter at bedside.  Patient is confused from her sepsis and hypoxia.  Explained the diagnosis, current disease process.  Daughter mentioned that patient had made it clear in the past that she would not want to be resuscitated.  We will change her CODE STATUS to DO NOT RESUSCITATE  CODE STATUS: DNR Time spent: 18 minutes

## 2017-12-03 NOTE — ED Provider Notes (Signed)
[facility] Emergency Department Provider Note  ____________________________________________  Time seen: Approximately 8:08 AM  I have reviewed the triage vital signs and the nursing notes.   HISTORY  Chief Complaint Altered Mental Status  Level 5 Caveat: Portions of the History and Physical including HPI and review of systems are unable to be completely obtained due to altered mental status   HPI Vicki Morales is a 77 y.o. female brought to the ED due to altered mental status and generalized weakness today.  She is also having shortness of breath.  She has a history of COPD and hypertension as well as mental illness.  She was recently hospitalized for behavioral medicine within the last 2 weeks.  She was ambulatory, talking in her usual state of health during that time.  Upon returning home with a adjusted medication regimen, she was having some side effects including feeling off balance and shakiness.  However, over the last 24 hours she has had difficulty breathing, nonproductive cough.  EMS noted axillary Temp of 102.   Additional history obtained from daughter when she arrived at the bedside.  She does note that the patient has been feeling off balance for the past week and had a fall yesterday.  No known head injury.   Past Medical History:  Diagnosis Date  . Anxiety   . Arthritis   . Cataract    right eye  . COPD (chronic obstructive pulmonary disease) ([place])   . Depression   . Dyspnea    DOE  . Dysrhythmia   . Edema    FEET/ANKLES  . GERD (gastroesophageal reflux disease)   . H/O wheezing   . History of hiatal hernia   . HOH (hard of hearing)   . Hypertension   . Incontinence of urine in female   . Orthopnea   . Pain    CHRONIC KNEE  . Pain    CHRONIC KNEE  . Paranoid disorder ([place])   . RLS (restless legs syndrome)   . Tremors of nervous system    HANDS     Patient Active Problem List   Diagnosis Date Noted  . Schizophrenia,  undifferentiated ([place]) 11/24/2017  . COPD with acute exacerbation ([place]) 10/24/2017  . Nocturnal hypoxia 08/21/2016  . Restless leg syndrome 08/20/2016  . Impingement syndrome of shoulder region 07/29/2016  . Hyponatremia 03/21/2016  . Pneumonia 03/18/2016  . History of suicide attempt 03/16/2016  . Overdose 03/16/2016  . Pulmonary hypertension ([place]) 11/11/2015  . Anxiety 09/22/2015  . Depression 09/22/2015  . Osteoarthritis 09/10/2015  . Rosacea 07/03/2015  . Breast pain 11/07/2014  . Callus of foot 11/07/2014  . CN (constipation) 11/07/2014  . Dermatitis, eczematoid 11/07/2014  . Diverticulosis of colon 11/07/2014  . Dizziness 11/07/2014  . Can't get food down 11/07/2014  . Accumulation of fluid in tissues 11/07/2014  . Fatigue 11/07/2014  . FOM (frequency of micturition) 11/07/2014  . Tension type headache 11/07/2014  . LBP (low back pain) 11/07/2014  . Episodic mood disorder ([place]) 11/07/2014  . Extreme obesity 11/07/2014  . Muscle ache 11/07/2014  . Disturbance of skin sensation 11/07/2014  . Awareness of heartbeats 11/07/2014  . Jerking 11/07/2014  . Body tinea 11/07/2014  . Essential (primary) hypertension 10/10/2014  . Moderate COPD (chronic obstructive pulmonary disease) ([place]) 04/01/2014  . Hx of obesity 04/01/2014  . CAFL (chronic airflow limitation) ([place]) 01/25/2009  . Malaise and fatigue 11/26/2008  . Aphasia 09/26/2008  . 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  GERD (gastroesophageal reflux disease) 06/07/1998  . HLD (hyperlipidemia) 06/07/1998  . OP (osteoporosis) 06/07/1998  . H/O total hysterectomy 03/08/1987  . Schizophrenia, in remission ([place]) 06/07/1978     Past Surgical History:  Procedure Laterality Date  . ABDOMINAL HYSTERECTOMY  1988  . APPENDECTOMY  1988  . CARDIAC CATHETERIZATION    . CATARACT EXTRACTION W/PHACO Right 07/14/2016   Procedure: CATARACT EXTRACTION PHACO AND INTRAOCULAR LENS PLACEMENT ([place]) anterior vitrectomy;   Surgeon: Estill Cotta, MD;  Location: [facility] ORS;  Service: Ophthalmology;  Laterality: Right;  [place] 02:40 AP% 24.4 CDE 59.98 Fluid pack # [id number]  . CATARACT EXTRACTION W/PHACO Left 02/16/2017   Procedure: CATARACT EXTRACTION PHACO AND INTRAOCULAR LENS PLACEMENT (IOC);  Surgeon: Estill Cotta, MD;  Location: [facility] ORS;  Service: Ophthalmology;  Laterality: Left;  Lot # [id number] H [place]: 01:11.8 AP%: 23.5 CDE: 32.09   . ESOPHAGOGASTRODUODENOSCOPY N/A 11/11/2014   Procedure: ESOPHAGOGASTRODUODENOSCOPY (EGD);  Surgeon: Josefine Class, MD;  Location: [facility] ENDOSCOPY;  Service: Endoscopy;  Laterality: N/A;  . TONSILLECTOMY AND ADENOIDECTOMY    . TRIGGER FINGER RELEASE  2004     Prior to Admission medications   Medication Sig Start Date End Date Taking? Authorizing Provider  albuterol (PROVENTIL) (2.5 MG/3ML) 0.083% nebulizer solution Take 3 mLs (2.5 mg total) by nebulization 2 (two) times daily as needed for wheezing or shortness of breath. 09/22/16   Birdie Sons, MD  aspirin EC 325 MG tablet Take 1 tablet (325 mg total) by mouth daily. Patient taking differently: Take 325 mg by mouth at bedtime.  09/23/15   Demetrios Loll, MD  benazepril (LOTENSIN) 5 MG tablet TAKE 1 TABLET(5 MG) BY MOUTH DAILY 11/20/17   Birdie Sons, MD  busPIRone (BUSPAR) 10 MG tablet Take 1 tablet (10 mg total) by mouth 3 (three) times daily. 12/02/17   Ursula Alert, MD  Calcium Carbonate-Vitamin D (CALCIUM 600+D) 600-400 MG-UNIT tablet Take 1 tablet by mouth 2 (two) times daily.     [provider]  Cranberry 250 MG CAPS Take 1 capsule by mouth daily.     [provider]  divalproex (DEPAKOTE) 250 MG DR tablet Take 1 tablet (250 mg total) by mouth 2 (two) times daily. 12/02/17   Ursula Alert, MD  fluticasone (FLONASE) 50 MCG/ACT nasal spray Place 1 spray into both nostrils daily.    [provider]  Fluticasone-Salmeterol (WIXELA INHUB) 250-50 MCG/DOSE AEPB Inhale 1 puff into the lungs  2 (two) times daily.    [provider]  Fluticasone-Umeclidin-Vilant (TRELEGY ELLIPTA) 100-62.5-25 MCG/INH AEPB Inhale 1 puff into the lungs daily. 11/08/17   Birdie Sons, MD  gabapentin (NEURONTIN) 100 MG capsule TAKE 1 CAPSULE(100 MG) BY MOUTH TWICE DAILY 04/26/17   Birdie Sons, MD  gabapentin (NEURONTIN) 300 MG capsule TAKE 2 CAPSULES(600 MG) BY MOUTH AT BEDTIME 04/26/17   Birdie Sons, MD  hydrOXYzine (ATARAX/VISTARIL) 25 MG tablet TAKE 1 TABLET(25 MG) BY MOUTH EVERY 8 HOURS AS NEEDED FOR ANXIETY 05/08/17   Birdie Sons, MD  loratadine (CLARITIN) 10 MG tablet Take 10 mg by mouth daily.    [provider]  magnesium oxide (MAG-OX) 400 MG tablet Take 400 mg by mouth 2 (two) times daily.    [provider]  Melatonin 10 MG CAPS Take 10 mg by mouth at bedtime as needed.    [provider]  meloxicam (MOBIC) 15 MG tablet TAKE 1 TABLET BY MOUTH EVERY DAY WITH A MEALS    [provider]  Misc Natural Products (OSTEO BI-FLEX ADV DOUBLE ST PO) Take 1 tablet by mouth 2 (two) times daily.     [provider]  montelukast (SINGULAIR) 10 MG tablet TAKE 1 TABLET BY MOUTH EVERY DAY 04/22/16   Birdie Sons, MD  Multiple Vitamin (MULTIVITAMIN WITH MINERALS) TABS tablet Take 1 tablet by mouth daily.    [provider]  Omega-3 Fatty Acids (FISH OIL) 1000 MG CAPS Take 1,000 mg by mouth daily.     [provider]  omeprazole (PRILOSEC) 20 MG capsule Take 20 mg by mouth daily before breakfast.     [provider]  oxybutynin (DITROPAN-XL) 10 MG 24 hr tablet TAKE 1 TABLET BY MOUTH AT BEDTIME 08/21/17   Birdie Sons, MD  Probiotic Product (ALIGN) 4 MG CAPS Take 4 mg by mouth daily.     [provider]  ranitidine (ZANTAC) 150 MG tablet Take 150 mg by mouth at bedtime.    [provider]  risperiDONE (RISPERDAL) 1 MG tablet Take 1 tablet (1 mg total) by mouth at bedtime. 12/02/17   Ursula Alert,  MD  theophylline (THEO-24) 200 MG 24 hr capsule Take by mouth. 12/01/17 12/31/17  [provider]  theophylline (UNIPHYL) 400 MG 24 hr tablet TAKE 1 TABLET BY MOUTH DAILY. Patient taking differently: TAKE 1/2 TABLET BY MOUTH DAILY. 08/22/16   Birdie Sons, MD  tiZANidine (ZANAFLEX) 4 MG tablet Take 1 tablet (4 mg total) by mouth every 8 (eight) hours as needed for muscle spasms. 06/17/17   Birdie Sons, MD  traMADol (ULTRAM) 50 MG tablet Take 2 tablets (100 mg total) by mouth 2 (two) times daily. Patient not taking: Reported on 11/25/2017 03/14/17   Birdie Sons, MD  traZODone (DESYREL) 50 MG tablet Take 0.5-1 tablets (25-50 mg total) by mouth at bedtime as needed for sleep. 11/08/17   Ursula Alert, MD     Allergies Arthrotec [diclofenac-misoprostol]; Codeine; Diclofenac; Hydrochlorothiazide; Penicillins; Sulfa antibiotics; Tiotropium bromide monohydrate; Tylenol [acetaminophen]; Morphine; and Pantoprazole   Family History  Problem Relation Age of Onset  . Breast cancer Sister   . Stroke Father   . Emphysema Father   . Anxiety disorder Father   . Depression Father   . Anxiety disorder Brother     Social History Social History   Tobacco Use  . Smoking status: Former Smoker    Years: 30.00  . Smokeless tobacco: Never Used  . Tobacco comment: quit in 1989  Substance Use Topics  . Alcohol use: No    Alcohol/week: 0.0 oz  . Drug use: No    Review of Systems  Constitutional:   Positive fever .  ENT:   No sore throat. No rhinorrhea. Cardiovascular:   No chest pain or syncope. Respiratory: Positive shortness of breath and cough. Gastrointestinal:   Negative for abdominal pain, vomiting and diarrhea.   All other systems reviewed and are negative except as documented above in ROS and HPI.  ____________________________________________   PHYSICAL EXAM:  VITAL SIGNS: ED Triage Vitals  Enc Vitals Group     BP      Pulse      Resp      Temp      Temp src       SpO2      Weight      Height      Head Circumference      Peak Flow      Pain Score  Pain Loc      Pain Edu?      Excl. in [place]?     Vital signs reviewed, nursing assessments reviewed.   Constitutional:   Alert and oriented to self.  Ill-appearing Eyes:   Conjunctivae are normal. EOMI. PERRL. ENT      Head:   Normocephalic and atraumatic.      Nose:   No congestion/rhinnorhea.       Mouth/Throat:   Dry mucous membranes, no pharyngeal erythema. No peritonsillar mass.       Neck:   No meningismus. Full ROM. Hematological/Lymphatic/Immunilogical:   No cervical lymphadenopathy. Cardiovascular:   Tachycardia heart rate 130. Symmetric bilateral radial and DP pulses.  No murmurs.  Respiratory:   Tachypnea with good air entry in all lung fields.  Crackles at bilateral bases.  No wheezing. Gastrointestinal:   Soft and nontender. Non distended. There is no CVA tenderness.  No rebound, rigidity, or guarding. Genitourinary:   Normal Musculoskeletal:   Normal range of motion in all extremities. No joint effusions.  No lower extremity tenderness.  No edema. Neurologic:   Mumbling speech.  Limited language..  Confused Cranial nerves III through XII intact Motor grossly intact.   Skin:    Skin is warm, dry and intact. No rash noted.  No petechiae, purpura, or bullae.  Normal capillary refill.  ____________________________________________    LABS (pertinent positives/negatives) (all labs ordered are listed, but only abnormal results are displayed) Labs Reviewed  COMPREHENSIVE METABOLIC PANEL - Abnormal; Notable for the following components:      Result Value   Sodium 134 (*)    Potassium 5.3 (*)    Glucose, Bld 110 (*)    BUN 53 (*)    Creatinine, Ser 1.51 (*)    Calcium 8.7 (*)    Total Protein 5.6 (*)    Albumin 3.0 (*)    GFR calc non Af Amer 32 (*)    GFR calc Af Amer 37 (*)    All other components within normal limits  TROPONIN I - Abnormal; Notable for the following  components:   Troponin I 0.05 (*)    All other components within normal limits  URINALYSIS, COMPLETE (UACMP) WITH MICROSCOPIC - Abnormal; Notable for the following components:   Color, Urine YELLOW (*)    APPearance CLEAR (*)    All other components within normal limits  BLOOD GAS, VENOUS - Abnormal; Notable for the following components:   Bicarbonate 28.4 (*)    All other components within normal limits  CULTURE, BLOOD (ROUTINE X 2)  CULTURE, BLOOD (ROUTINE X 2)  URINE CULTURE  LACTIC ACID, PLASMA  LIPASE, BLOOD  LACTIC ACID, PLASMA  CBC WITH DIFFERENTIAL/PLATELET  PROCALCITONIN  CBC  DIFFERENTIAL  PROTIME-INR  APTT   ____________________________________________   EKG  Interpreted by me Sinus tachycardia rate 134.  Left axis, normal intervals.  Normal QRS ST segments and T waves.  ____________________________________________    RADIOLOGY  Dg Chest Port 1 View  Result Date: 12/03/2017 CLINICAL DATA:  Dyspnea.  Bibasilar crackles.  Ex-smoker. EXAM: PORTABLE CHEST 1 VIEW COMPARISON:  11/24/2017. FINDINGS: Mildly enlarged cardiac silhouette with a mild increase in size. The pulmonary vasculature and interstitial markings remain prominent. Interval patchy opacity in the right upper, mid and lower lung zones. Minimal left basilar atelectasis. Thoracic spine degenerative changes and mild bilateral shoulder degenerative changes. Diffuse osteopenia. Calcified mediastinal lymph nodes without significant change. IMPRESSION: 1. Interval multilobar pneumonia on the right. 2. Minimal left basilar atelectasis.  3. Mild cardiomegaly, mild pulmonary vascular congestion and mild chronic interstitial lung disease. Electronically Signed   By: Claudie Revering M.D.   On: 12/03/2017 08:31    ____________________________________________   PROCEDURES .Critical Care Performed by: Carrie Mew, MD Authorized by: Carrie Mew, MD   Critical care provider statement:    Critical care time  (minutes):  35   Critical care time was exclusive of:  Separately billable procedures and treating other patients   Critical care was necessary to treat or prevent imminent or life-threatening deterioration of the following conditions:  Sepsis and shock   Critical care was time spent personally by me on the following activities:  Development of treatment plan with patient or surrogate, discussions with consultants, evaluation of patient's response to treatment, examination of patient, obtaining history from patient or surrogate, ordering and performing treatments and interventions, ordering and review of laboratory studies, ordering and review of radiographic studies, pulse oximetry, re-evaluation of patient's condition and review of old charts    ____________________________________________  [place] associated pneumonia, sepsis, urinary tract infection, stroke, medication side effect  CLINICAL IMPRESSION / ASSESSMENT AND PLAN / ED COURSE  Pertinent labs & imaging results that were available during my care of the patient were reviewed by me and considered in my medical decision making (see chart for details).    Patient presents with altered mental status.  She also has hypoxia, fever, tachycardia, all very concerning for sepsis.  Blood pressure is normal, but altered mental status raises suspicion for shock.  On initial assessment, code sepsis is initiated, 30 mL/kg IV fluid bolus based on ideal body weight ordered, vancomycin and cefepime ordered for antibiotic coverage.  In addition to usual sepsis protocol, I will obtain a CT scan of the head to evaluate for stroke or possible intracranial hemorrhage given her fall yesterday and feeling off balance for the past week..  Clinical Course as of Dec 03 952  Sat Dec 03, 2017  0900 Cxr c/w multifocal pna of right lung. Daughter at bedside informed. Labs overal unremarkable. Still waiting for lactate. Continue IVF. Abx  infusing.   DG Chest Port 1 View [PS]  0901 HR improved to 120 so far. Bp stable. WOB stable; no worsening distress.    [PS]  6734 Slightly elevated troponin likely rate related demand ischemia  Troponin I(!!): 0.05 [PS]  0929 Lactate normal, not indicative of shock.  Lactic Acid, Venous: 1.5 [PS]    Clinical Course User Index [PS] Carrie Mew, MD     ____________________________________________   FINAL CLINICAL IMPRESSION(S) / ED DIAGNOSES    Final diagnoses:  Disorientation  Sepsis, due to unspecified organism ([place])  HCAP (healthcare-associated pneumonia)  Acute respiratory failure with hypoxia [facility])     ED Discharge Orders    None      Portions of this note were generated with dragon dictation software. Dictation errors may occur despite best attempts at proofreading.    Carrie Mew, MD 12/03/17 [phone]

## 2017-12-03 NOTE — Significant Event (Signed)
Rapid Response Event Note  Overview: Time Called: [place] Time: 8242 Event Type: Hypotension  Initial Focused Assessment: called for rapid response on pt who arrived hypotensive from ED   Interventions: laying in bed, lethargic, but arouses, see flowsheets for details. Dr Michail Sermon to bedside, will tx to stepdown. DR Conforti aware.  Plan of Care (if not transferred):  Event Summary: Name of Physician Notified: [email or web address] at 1315  Name of Consulting Physician Notified: Conforti at 1330  Outcome: Transferred (Comment)(to ICU 4)  Event End Time: [place]

## 2017-12-04 ENCOUNTER — Inpatient Hospital Stay (HOSPITAL_COMMUNITY)
Admit: 2017-12-04 | Discharge: 2017-12-04 | Disposition: A | Discharge status: Home / Self Care | Payer: Medicare HMO | Attending: Internal Medicine | Admitting: Internal Medicine

## 2017-12-04 DIAGNOSIS — I5030 Unspecified diastolic (congestive) heart failure: Secondary | ICD-10-CM

## 2017-12-04 LAB — BASIC METABOLIC PANEL
Anion gap: 4 — ABNORMAL LOW (ref 5–15)
Anion gap: 5 (ref 5–15)
BUN: 46 mg/dL — ABNORMAL HIGH (ref 8–23)
BUN: 39 mg/dL — AB (ref 8–23)
CALCIUM: 8 mg/dL — AB (ref 8.9–10.3)
CHLORIDE: 105 mmol/L (ref 98–111)
CO2: 22 mmol/L (ref 22–32)
CO2: 24 mmol/L (ref 22–32)
CREATININE: 1.09 mg/dL — AB (ref 0.44–1.00)
CREATININE: 0.98 mg/dL (ref 0.44–1.00)
Calcium: 8.7 mg/dL — ABNORMAL LOW (ref 8.9–10.3)
Chloride: 108 mmol/L (ref 98–111)
GFR calc Af Amer: 55 mL/min — ABNORMAL LOW (ref >60)
GFR calc Af Amer: >60 mL/min (ref >60)
GFR, EST NON AFRICAN AMERICAN: 47 mL/min — AB (ref >60)
GFR, EST NON AFRICAN AMERICAN: 54 mL/min — AB (ref >60)
GLUCOSE: 136 mg/dL — AB (ref 70–99)
GLUCOSE: 227 mg/dL — AB (ref 70–99)
Potassium: 5.4 mmol/L — ABNORMAL HIGH (ref 3.5–5.1)
Potassium: 4.6 mmol/L (ref 3.5–5.1)
SODIUM: 134 mmol/L — AB (ref 135–145)
SODIUM: 134 mmol/L — AB (ref 135–145)

## 2017-12-04 LAB — CBC
HCT: 35.1 % (ref 35.0–47.0)
Hemoglobin: 11.5 g/dL — ABNORMAL LOW (ref 12.0–16.0)
MCH: 31 pg (ref 26.0–34.0)
MCHC: 32.8 g/dL (ref 32.0–36.0)
MCV: 94.5 fL (ref 80.0–100.0)
PLATELETS: 184 10*3/uL (ref 150–440)
RBC: 3.72 MIL/uL — ABNORMAL LOW (ref 3.80–5.20)
RDW: 15.5 % — AB (ref 11.5–14.5)
WBC: 17.7 10*3/uL — AB (ref 3.6–11.0)

## 2017-12-04 LAB — URINE CULTURE: Culture: NO GROWTH

## 2017-12-04 LAB — TROPONIN I: TROPONIN I: 0.03 ng/mL — AB (ref <0.03)

## 2017-12-04 MED ORDER — PANTOPRAZOLE SODIUM 40 MG PO TBEC: 40.0000 mg | DELAYED_RELEASE_TABLET | Freq: Every day | ORAL | Status: DC

## 2017-12-04 MED ORDER — FUROSEMIDE 10 MG/ML IJ SOLN
40.0000 mg | Freq: Once | INTRAMUSCULAR | Status: AC
  Administered 2017-12-04: 13:00:00 40 mg via INTRAVENOUS
  Filled 2017-12-04: qty 4

## 2017-12-04 MED ORDER — INSULIN ASPART 100 UNIT/ML IV SOLN
10.0000 [IU] | Freq: Once | INTRAVENOUS | Status: AC
  Administered 2017-12-04: 10 [IU] via INTRAVENOUS
  Filled 2017-12-04: qty 0.1

## 2017-12-04 MED ORDER — DEXTROSE 50 % IV SOLN
1.0000 | Freq: Once | INTRAVENOUS | Status: AC
  Administered 2017-12-04: 50 mL via INTRAVENOUS
  Filled 2017-12-04: qty 50

## 2017-12-04 NOTE — Progress Notes (Signed)
[place] [facility] [place] NAME: Annita Ratliff    MR#:  [id number]  DATE OF BIRTH:  28-Aug-1940  SUBJECTIVE:  CHIEF COMPLAINT:   Chief Complaint  Patient presents with  . Altered Mental Status   Continues to have shortness of breath and cough.  Confusion has improved.  Daughter at bedside.  REVIEW OF SYSTEMS:    Review of Systems  Constitutional: Positive for malaise/fatigue. Negative for chills and fever.  HENT: Negative for sore throat.   Eyes: Negative for blurred vision, double vision and pain.  Respiratory: Positive for cough, sputum production, shortness of breath and wheezing. Negative for hemoptysis.   Cardiovascular: Negative for chest pain, palpitations, orthopnea and leg swelling.  Gastrointestinal: Negative for abdominal pain, constipation, diarrhea, heartburn, nausea and vomiting.  Genitourinary: Negative for dysuria and hematuria.  Musculoskeletal: Negative for back pain and joint pain.  Skin: Negative for rash.  Neurological: Negative for sensory change, speech change, focal weakness and headaches.  Endo/Heme/Allergies: Does not bruise/bleed easily.  Psychiatric/Behavioral: Positive for memory loss. Negative for depression. The patient is not nervous/anxious.     DRUG ALLERGIES:   Allergies  Allergen Reactions  . Arthrotec [Diclofenac-Misoprostol] Hives  . Codeine Other (See Comments)    Headache   . Diclofenac Other (See Comments) and Hives    Pt unsure allergy  . Hydrochlorothiazide Other (See Comments)    Weakness   . Penicillins Hives    Has patient had a PCN reaction causing immediate rash, facial/tongue/throat swelling, SOB or lightheadedness with hypotension: No Has patient had a PCN reaction causing severe rash involving mucus membranes or skin necrosis: No Has patient had a PCN reaction that required hospitalization: No Has patient had a PCN reaction occurring within the last 10 years: No If all of the above  answers are "NO", then may proceed with Cephalosporin use.  . Sulfa Antibiotics Hives  . Tiotropium Bromide Monohydrate Other (See Comments)    Blurry vision   . Tylenol [Acetaminophen] Other (See Comments)    Doesn't work  . Morphine Hives and Rash  . Pantoprazole Rash    VITALS:  Blood pressure (!) 146/72, pulse 86, temperature 98.4 F (36.9 C), resp. rate 20, height 5\' 4"  (1.626 m), weight 122.1 kg (269 lb 3.2 oz), SpO2 99 %.  PHYSICAL EXAMINATION:   Physical Exam  GENERAL:  77 y.o.-year-old patient lying in the bed with respiratory distress.  Obese EYES: Pupils equal, round, reactive to light and accommodation. No scleral icterus. Extraocular muscles intact.  HEENT: Head atraumatic, normocephalic. Oropharynx and nasopharynx clear.  NECK:  Supple, no jugular venous distention. No thyroid enlargement, no tenderness.  LUNGS: Bilateral wheezing and crackles CARDIOVASCULAR: S1, S2 normal. No murmurs, rubs, or gallops.  ABDOMEN: Soft, nontender, nondistended. Bowel sounds present. No organomegaly or mass.  EXTREMITIES: No cyanosis, clubbing.  Bilateral lower extremity edema NEUROLOGIC: Cranial nerves II through XII are intact. No focal Motor or sensory deficits b/l.  PSYCHIATRIC: The patient is alert and awake SKIN: No obvious rash, lesion, or ulcer.   LABORATORY PANEL:   CBC Recent Labs  Lab 12/04/17 0127  WBC 17.7*  HGB 11.5*  HCT 35.1  PLT 184   ------------------------------------------------------------------------------------------------------------------ Chemistries  Recent Labs  Lab 12/03/17 0813 12/04/17 0127  NA 134* 134*  K 5.3* 5.4*  CL 102 108  CO2 25 22  GLUCOSE 110* 136*  BUN 53* 46*  CREATININE 1.51* 1.09*  CALCIUM 8.7* 8.0*  AST 24  --  ALT 14  --   ALKPHOS 61  --   BILITOT 0.7  --    ------------------------------------------------------------------------------------------------------------------  Cardiac Enzymes Recent Labs  Lab  12/04/17 0127  TROPONINI 0.03*   ------------------------------------------------------------------------------------------------------------------  RADIOLOGY:  Ct Head Wo Contrast  Result Date: 12/03/2017 CLINICAL DATA:  Patient with generalized weakness and altered mental status. EXAM: CT HEAD WITHOUT CONTRAST TECHNIQUE: Contiguous axial images were obtained from the base of the skull through the vertex without intravenous contrast. COMPARISON:  Brain CT 09/22/2015. FINDINGS: Brain: Ventricles and sulci are appropriate for patient's age. No evidence for acute cortically based infarct, intracranial hemorrhage, mass lesion or mass-effect. Vascular: Internal carotid arterial vascular calcifications. Skull: Intact. Sinuses/Orbits: Paranasal sinuses are well aerated. Mastoid air cells are unremarkable. Other: None. IMPRESSION: No acute intracranial process. Electronically Signed   By: Lovey Newcomer M.D.   On: 12/03/2017 10:36   Dg Chest Port 1 View  Result Date: 12/03/2017 CLINICAL DATA:  Dyspnea.  Bibasilar crackles.  Ex-smoker. EXAM: PORTABLE CHEST 1 VIEW COMPARISON:  11/24/2017. FINDINGS: Mildly enlarged cardiac silhouette with a mild increase in size. The pulmonary vasculature and interstitial markings remain prominent. Interval patchy opacity in the right upper, mid and lower lung zones. Minimal left basilar atelectasis. Thoracic spine degenerative changes and mild bilateral shoulder degenerative changes. Diffuse osteopenia. Calcified mediastinal lymph nodes without significant change. IMPRESSION: 1. Interval multilobar pneumonia on the right. 2. Minimal left basilar atelectasis. 3. Mild cardiomegaly, mild pulmonary vascular congestion and mild chronic interstitial lung disease. Electronically Signed   By: Claudie Revering M.D.   On: 12/03/2017 08:31   [place] Ekg Site Rite  Result Date: 12/03/2017 If Site Rite image not attached, placement could not be confirmed due to current cardiac  rhythm.    ASSESSMENT AND PLAN:   Breleigh Carpino  is a 77 y.o. female with a known history of COPD not on home oxygen, hypertension, major depression and anxiety requiring Geri psych hospitalization last week, arthritis with bilateral knee pain presents to hospital from home secondary to worsening shortness of breath and fevers.  *Healthcare acquired pneumonia with sepsis present on admission and acute hypoxic respiratory failure IV antibiotics.  IV steroids.  Scheduled nebulizers.  Wean oxygen as tolerated Culture sent and pending Appreciate pulmonary input  We will give 1 dose of IV Lasix due to increasing lower extremity edema.  Patient has crackles on examination.  *COPD exacerbation See above  *Acute kidney injury Resolved  * Hyperkalemia-hold both lisinopril for now.  *  Depression anxiety-stable for now.  No active suicidal ideation.  Continue home medications.  *  DVT prophylaxis-Lovenox  All the records are reviewed and case discussed with Care Management/Social Worker Management plans discussed with the patient, family and they are in agreement.  CODE STATUS: DNR  DVT Prophylaxis: SCDs  TOTAL TIME TAKING CARE OF THIS PATIENT: 35 minutes.   POSSIBLE D/C IN 2-3 DAYS, DEPENDING ON CLINICAL CONDITION.  Leia Alf Deaisa Merida M.D on 12/04/2017 at 12:54 PM  Between 7am to 6pm - Pager - [phone]  After 6pm go to [email or web address] - password [email or web address] [facility]  [facility] [place] [facility]  Office  [phone]  CC: Primary care physician; Birdie Sons, MD  Note: This dictation was prepared with Dragon dictation along with smaller phrase technology. Any transcriptional errors that result from this process are unintentional.

## 2017-12-04 NOTE — Progress Notes (Signed)
PT Hold Note  Patient Details Name: Vicki Morales MRN: [id number] DOB: 1941/01/07   Evaluation Hold:    Reason Eval/Treat Not Completed: Medical issues which prohibited therapy. Order received and chart reviewed. Troponin trending down and per MD note most likely related to demand ischemia. No EKG changes however pt currently with elevated potassium of 5.4. Per rehab protocol PT evaluation currently contraindicated. Per MD note BMP to be repeated. Will continue to follow and attempt PT evaluation when medically appropriate.   Lyndel Safe Hanley Rispoli PT, DPT, GCS  Rilya Longo 12/04/2017, 10:32 AM

## 2017-12-04 NOTE — Progress Notes (Signed)
Report called to [facility], Therapist, sports. Patient updated on plan of care. CCMD notified of transfer. Patient transferred on telemetry. Wilnette Kales

## 2017-12-04 NOTE — Progress Notes (Signed)
Follow up - Critical Care Medicine Note  Patient Details:    Vicki Morales is an 77 y.o. female.with a past medical history remarkable for hypertension, GERD, COPD, extensive psychiatric history, presented with altered mental status, shortness of breath, fevers, found to have multi lobar right-sided pneumonia with hypotension admitted to the intensive care unit.  Lines, Airways, Drains: Airway (Active)     PICC Triple Lumen 03/31/84 PICC Right Basilic 40 cm 0 cm (Active)  Indication for Insertion or Continuance of Line Vasoactive infusions 12/03/2017  8:00 PM  Exposed Catheter (cm) 0 cm 12/03/2017  4:19 PM  Site Assessment Clean;Dry;Intact 12/03/2017  8:00 PM  Lumen #1 Status Flushed;Saline locked;Blood return noted 12/03/2017  8:00 PM  Lumen #2 Status Flushed;Saline locked;Blood return noted 12/03/2017  8:00 PM  Lumen #3 Status Flushed;Saline locked;Blood return noted 12/03/2017  8:00 PM  Dressing Type Transparent;Securing device 12/03/2017  8:00 PM  Dressing Status Clean;Dry;Antimicrobial disc in place;Intact 12/03/2017  8:00 PM  Dressing Intervention New dressing 12/03/2017  4:19 PM  Dressing Change Due 12/10/17 12/03/2017  4:19 PM     External Urinary Catheter (Active)     External Urinary Catheter (Active)  Collection Container Dedicated Suction Canister 12/04/2017  2:00 AM  Intervention Equipment Changed 12/03/2017 10:00 PM    Anti-infectives:  Anti-infectives (From admission, onward)   Start     Dose/Rate Route Frequency Ordered Stop   12/03/17 2000  ceFEPIme (MAXIPIME) 2 g in sodium chloride 0.9 % 100 mL IVPB     2 g 200 mL/hr over 30 Minutes Intravenous Every 12 hours 12/03/17 1258     12/03/17 1930  vancomycin (VANCOCIN) 1,250 mg in sodium chloride 0.9 % 250 mL IVPB     1,250 mg 166.7 mL/hr over 90 Minutes Intravenous Every 24 hours 12/03/17 1258     12/03/17 0815  ceFEPIme (MAXIPIME) 2 g in sodium chloride 0.9 % 100 mL IVPB     2 g 200 mL/hr over 30 Minutes Intravenous  Once  12/03/17 0807 12/03/17 0913   12/03/17 0815  vancomycin (VANCOCIN) IVPB 1000 mg/200 mL premix     1,000 mg 200 mL/hr over 60 Minutes Intravenous  Once 12/03/17 2778 12/03/17 2423      Microbiology: Results for orders placed or performed during the hospital encounter of 12/03/17  Blood Culture (routine x 2)     Status: None (Preliminary result)   Collection Time: 12/03/17  8:13 AM  Result Value Ref Range Status   Specimen Description LEFT ANTECUBITAL  Final   Special Requests   Final    BOTTLES DRAWN AEROBIC AND ANAEROBIC Blood Culture results may not be optimal due to an inadequate volume of blood received in culture bottles   Culture   Final    NO GROWTH < 24 HOURS Performed at [facility], [address] [place], [place] [address]    Report Status PENDING  Incomplete  Blood Culture (routine x 2)     Status: None (Preliminary result)   Collection Time: 12/03/17  8:13 AM  Result Value Ref Range Status   Specimen Description BLOOD LEFT HAND  Final   Special Requests   Final    BOTTLES DRAWN AEROBIC AND ANAEROBIC Blood Culture adequate volume   Culture   Final    NO GROWTH < 24 HOURS Performed at [facility], [address] [place], [place] [address]    Report Status PENDING  Incomplete  MRSA PCR Screening     Status: None  Collection Time: 12/03/17  1:01 PM  Result Value Ref Range Status   MRSA by PCR NEGATIVE NEGATIVE Final    Comment:        The GeneXpert MRSA Assay (FDA approved for NASAL specimens only), is one component of a comprehensive MRSA colonization surveillance program. It is not intended to diagnose MRSA infection nor to guide or monitor treatment for MRSA infections. Performed at [facility], [place], [place] [address]    Events:   Studies: Ct Head Wo Contrast  Result Date: 12/03/2017 CLINICAL DATA:  Patient with generalized weakness and altered mental status. EXAM: CT HEAD WITHOUT CONTRAST  TECHNIQUE: Contiguous axial images were obtained from the base of the skull through the vertex without intravenous contrast. COMPARISON:  Brain CT 09/22/2015. FINDINGS: Brain: Ventricles and sulci are appropriate for patient's age. No evidence for acute cortically based infarct, intracranial hemorrhage, mass lesion or mass-effect. Vascular: Internal carotid arterial vascular calcifications. Skull: Intact. Sinuses/Orbits: Paranasal sinuses are well aerated. Mastoid air cells are unremarkable. Other: None. IMPRESSION: No acute intracranial process. Electronically Signed   By: Lovey Newcomer M.D.   On: 12/03/2017 10:36   Dg Chest Port 1 View  Result Date: 12/03/2017 CLINICAL DATA:  Dyspnea.  Bibasilar crackles.  Ex-smoker. EXAM: PORTABLE CHEST 1 VIEW COMPARISON:  11/24/2017. FINDINGS: Mildly enlarged cardiac silhouette with a mild increase in size. The pulmonary vasculature and interstitial markings remain prominent. Interval patchy opacity in the right upper, mid and lower lung zones. Minimal left basilar atelectasis. Thoracic spine degenerative changes and mild bilateral shoulder degenerative changes. Diffuse osteopenia. Calcified mediastinal lymph nodes without significant change. IMPRESSION: 1. Interval multilobar pneumonia on the right. 2. Minimal left basilar atelectasis. 3. Mild cardiomegaly, mild pulmonary vascular congestion and mild chronic interstitial lung disease. Electronically Signed   By: Claudie Revering M.D.   On: 12/03/2017 08:31   Dg Chest Port 1 View  Result Date: 11/24/2017 CLINICAL DATA:  Shortness of breath, history COPD, increased hallucinations and paranoia EXAM: PORTABLE CHEST 1 VIEW COMPARISON:  Portable exam 1647 hours compared to 10/24/2017 FINDINGS: Upper normal heart size. Normal mediastinal contours and pulmonary vascularity. Calcified mediastinal lymph nodes. Peribronchial thickening with new focal LEFT perihilar opacity question infiltrate, less likely nodule due to short interval  since prior exam. Remaining lungs clear. No pleural effusion or pneumothorax. Bones demineralized with BILATERAL glenohumeral degenerative changes. IMPRESSION: Bronchitic changes with new LEFT perihilar focal opacity favoring infiltrate. Due to the nodular nature of this finding, follow-up radiographs are recommended in 3-4 weeks to ensure resolution and exclude developing lung nodule. Electronically Signed   By: Lavonia Dana M.D.   On: 11/24/2017 17:16   [place] Ekg Site Rite  Result Date: 12/03/2017 If Site Rite image not attached, placement could not be confirmed due to current cardiac rhythm.   Consults: Treatment Team:  Hermelinda Dellen, DO   Subjective:    Overnight Issues: overnight she has done quite well. She has not required pressors, she has had significant improvement in mentation and shortness of breath and presently resting comfortable in the intensive care unit  Objective:  Vital signs for last 24 hours: Temp:  [98.2 F (36.8 C)-101.4 F (38.6 C)] 98.7 F (37.1 C) (06/30 0000) Pulse Rate:  [36-132] 79 (06/30 0400) Resp:  [11-24] 23 (06/30 0600) BP: (64-161)/(26-96) 161/80 (06/30 0600) SpO2:  [55 %-100 %] 100 % (06/30 0400) Weight:  [253 lb (114.8 kg)-265 lb 10.5 oz (120.5 kg)] 265 lb 10.5 oz (120.5 kg) (06/29 1332)  Hemodynamic parameters for last 24 hours:    Intake/Output from previous day: 06/29 0701 - 06/30 0700 In: 3418 [I.V.:941.8; IV Piggyback:2476.2] Out: -   Intake/Output this shift: No intake/output data recorded.  Vent settings for last 24 hours:    Physical Exam:  Vital signs:       Please see the above listed vital signs HEENT:           Patient wearing nasal cannula oxygen, trachea is midline, no accessory muscle utilization noted, no oral lesions appreciated Cardiovascular:           Regular rate and rhythm Pulmonary:      Prolonged expiratory phase with expiratory rhonchi and rubs appreciated left greater than right Abdominal exam:        Positive  bowel sounds, mildly distended but soft exam Extremities:     1+ edema appreciated Neurologic:      Patient is responsive, moves all extremities, awake and alert    Assessment/Plan:   Altered mental status. CT scan of the head was negative for any acute intracranial abnormalities, most likely encephalopathy secondary to infection has completely resolved this morning. Patient is awake alert and communicating in no distress with stable hemodynamics  Pneumonia / COPD. On supplemental oxygen to titrate to saturation greater than 90%,  Broad-spectrum antibiotic coverage, albuterol, Atrovent, Solu-Medrol  Leukocytosis. Secondary to pneumonia  Hyponatremia. Saline resuscitation, Sodium 134  Hyperkalemia. Repeat BMP and follow-up potassium, potassium 5.4 without any EKG changes  Protein calorie malnutrition  Mildly elevated troponin. No acute ischemic disease, most likely reflects supply demand ischemia  Cardiomegaly, will obtain echocardiography to assess LV function  If patient's hemodynamics remain stable will consider floor transfer  Thersa Mohiuddin 12/04/2017  *Care during the described time interval was provided by me and/or other providers on the critical care team.  I have reviewed this patient's available data, including medical history, events of note, physical examination and test results as part of my evaluation.

## 2017-12-05 LAB — BASIC METABOLIC PANEL
ANION GAP: 5 (ref 5–15)
BUN: 36 mg/dL — ABNORMAL HIGH (ref 8–23)
CALCIUM: 9.1 mg/dL (ref 8.9–10.3)
CO2: 27 mmol/L (ref 22–32)
CREATININE: 0.81 mg/dL (ref 0.44–1.00)
Chloride: 106 mmol/L (ref 98–111)
Glucose, Bld: 182 mg/dL — ABNORMAL HIGH (ref 70–99)
Potassium: 4.7 mmol/L (ref 3.5–5.1)
Sodium: 138 mmol/L (ref 135–145)

## 2017-12-05 LAB — ECHOCARDIOGRAM COMPLETE
HEIGHTINCHES: 64 in
Weight: 4250.47 oz

## 2017-12-05 MED ORDER — METHYLPREDNISOLONE SODIUM SUCC 40 MG IJ SOLR
40.0000 mg | Freq: Two times a day (BID) | INTRAMUSCULAR | Status: DC
  Administered 2017-12-05 – 2017-12-06 (×2): 40 mg via INTRAVENOUS
  Filled 2017-12-05 (×2): qty 1

## 2017-12-05 MED ORDER — LISINOPRIL 5 MG PO TABS
5.0000 mg | ORAL_TABLET | Freq: Every day | ORAL | Status: DC
  Administered 2017-12-05 – 2017-12-06 (×2): 5 mg via ORAL
  Filled 2017-12-05 (×2): qty 1

## 2017-12-05 MED ORDER — FUROSEMIDE 10 MG/ML IJ SOLN
40.0000 mg | Freq: Once | INTRAMUSCULAR | Status: AC
  Administered 2017-12-05: 40 mg via INTRAVENOUS
  Filled 2017-12-05: qty 4

## 2017-12-05 MED ORDER — GUAIFENESIN-DM 100-10 MG/5ML PO SYRP
5.0000 mL | ORAL_SOLUTION | ORAL | Status: DC | PRN
  Administered 2017-12-05 (×2): 5 mL via ORAL
  Filled 2017-12-05 (×3): qty 5

## 2017-12-05 NOTE — Progress Notes (Signed)
Vicki Morales at [place] NAME: Vicki Morales    MR#:  [id number]  DATE OF BIRTH:  Nov 25, 1940  SUBJECTIVE:  CHIEF COMPLAINT:   Chief Complaint  Patient presents with  . Altered Mental Status   Feels better. Cough is non productive. Afebrile  REVIEW OF SYSTEMS:    Review of Systems  Constitutional: Positive for malaise/fatigue. Negative for chills and fever.  HENT: Negative for sore throat.   Eyes: Negative for blurred vision, double vision and pain.  Respiratory: Positive for cough, sputum production, shortness of breath and wheezing. Negative for hemoptysis.   Cardiovascular: Negative for chest pain, palpitations, orthopnea and leg swelling.  Gastrointestinal: Negative for abdominal pain, constipation, diarrhea, heartburn, nausea and vomiting.  Genitourinary: Negative for dysuria and hematuria.  Musculoskeletal: Negative for back pain and joint pain.  Skin: Negative for rash.  Neurological: Negative for sensory change, speech change, focal weakness and headaches.  Endo/Heme/Allergies: Does not bruise/bleed easily.  Psychiatric/Behavioral: Positive for memory loss. Negative for depression. The patient is not nervous/anxious.     DRUG ALLERGIES:   Allergies  Allergen Reactions  . Arthrotec [Diclofenac-Misoprostol] Hives  . Codeine Other (See Comments)    Headache   . Diclofenac Other (See Comments) and Hives    Pt unsure allergy  . Hydrochlorothiazide Other (See Comments)    Weakness   . Penicillins Hives    Has patient had a PCN reaction causing immediate rash, facial/tongue/throat swelling, SOB or lightheadedness with hypotension: No Has patient had a PCN reaction causing severe rash involving mucus membranes or skin necrosis: No Has patient had a PCN reaction that required hospitalization: No Has patient had a PCN reaction occurring within the last 10 years: No If all of the above answers are "NO", then may proceed with  Cephalosporin use.  . Sulfa Antibiotics Hives  . Tiotropium Bromide Monohydrate Other (See Comments)    Blurry vision   . Tylenol [Acetaminophen] Other (See Comments)    Doesn't work  . Morphine Hives and Rash  . Pantoprazole Rash    VITALS:  Blood pressure (!) 177/74, pulse (!) 107, temperature 98 F (36.7 C), resp. rate (!) 22, height 5\' 4"  (1.626 m), weight 122.1 kg (269 lb 3.2 oz), SpO2 94 %.  PHYSICAL EXAMINATION:   Physical Exam  GENERAL:  77 y.o.-year-old patient lying in the bed with respiratory distress.  Obese. EYES: Pupils equal, round, reactive to light and accommodation. No scleral icterus. Extraocular muscles intact.  HEENT: Head atraumatic, normocephalic. Oropharynx and nasopharynx clear.  NECK:  Supple, no jugular venous distention. No thyroid enlargement, no tenderness.  LUNGS: Bilateral wheezing CARDIOVASCULAR: S1, S2 normal. No murmurs, rubs, or gallops.  ABDOMEN: Soft, nontender, nondistended. Bowel sounds present. No organomegaly or mass.  EXTREMITIES: No cyanosis, clubbing.  Bilateral lower extremity edema NEUROLOGIC: Cranial nerves II through XII are intact. No focal Motor or sensory deficits b/l.  PSYCHIATRIC: The patient is alert and awake SKIN: No obvious rash, lesion, or ulcer.   LABORATORY PANEL:   CBC Recent Labs  Lab 12/04/17 0127  WBC 17.7*  HGB 11.5*  HCT 35.1  PLT 184   ------------------------------------------------------------------------------------------------------------------ Chemistries  Recent Labs  Lab 12/03/17 0813  12/05/17 0403  NA 134*   < > 138  K 5.3*   < > 4.7  CL 102   < > 106  CO2 25   < > 27  GLUCOSE 110*   < > 182*  BUN 53*   < >  36*  CREATININE 1.51*   < > 0.81  CALCIUM 8.7*   < > 9.1  AST 24  --   --   ALT 14  --   --   ALKPHOS 61  --   --   BILITOT 0.7  --   --    < > = values in this interval not displayed.    ------------------------------------------------------------------------------------------------------------------  Cardiac Enzymes Recent Labs  Lab 12/04/17 0127  TROPONINI 0.03*   ------------------------------------------------------------------------------------------------------------------  RADIOLOGY:  [place] Ekg Site Rite  Result Date: 12/03/2017 If Site Rite image not attached, placement could not be confirmed due to current cardiac rhythm.  ASSESSMENT AND PLAN:   Vern Prestia  is a 77 y.o. female with a known history of COPD not on home oxygen, hypertension, major depression and anxiety requiring Geri psych hospitalization last week, arthritis with bilateral knee pain presents to hospital from home secondary to worsening shortness of breath and fevers.  *Healthcare acquired pneumonia with sepsis present on admission and acute hypoxic respiratory failure IV antibiotics.  IV steroids.  Scheduled nebulizers.  Wean oxygen as tolerated Culture sent and NGTD Appreciate pulmonary input  Fluid overload due to IVF resuscitation earlier in admission for hypotension. Improved with IV lasix. Will give one more dose today.  *COPD exacerbation See above  *Acute kidney injury Resolved  * Hyperkalemia Resolved  *  Depression anxiety-stable  *  DVT prophylaxis-Lovenox  All the records are reviewed and case discussed with Care Management/Social Worker Management plans discussed with the patient, family and they are in agreement.  CODE STATUS: DNR  TOTAL TIME TAKING CARE OF THIS PATIENT: 35 minutes.   POSSIBLE D/C IN 1-2 DAYS, DEPENDING ON CLINICAL CONDITION.  Vicki Morales M.D on 12/05/2017 at 11:34 AM  Between 7am to 6pm - Pager - [phone]  After 6pm go to [email or web address] - password [email or web address] [facility]  [facility] [place] [facility]  Office  [phone]  CC: Primary care physician; Birdie Sons, MD  Note: This dictation was prepared with Dragon dictation along  with smaller phrase technology. Any transcriptional errors that result from this process are unintentional.

## 2017-12-05 NOTE — Care Management Note (Addendum)
Case Management Note  Patient Details  Name: Vicki Morales MRN: [id number] Date of Birth: July 29, 1940  Subjective/Objective:  RNCM spoke with PT who is recommending Home health services. RNCM spoke with patient and agrees that she would benefit from this since her daughter works a large part of the day. Patient currently has a walker, bsc and shower chair in the home. Patients preference was [facility] for Home health. I spoke with [place] from [facility] who has agreed to accept the patient at discharge. Daughter will provide transport at discharge.                    Action/Plan: Patient also requiring acute oxygen, will assess qualifying sats when discharge is closer.  Expected Discharge Date:                  Expected Discharge Plan:     In-House Referral:     Discharge planning Services  CM Consult  Post Acute Care Choice:  Home Health Choice offered to:  Patient  DME Arranged:    DME Agency:     HH Arranged:  RN, PT [place] Agency:  [facility]  Status of Service:  In process, will continue to follow  If discussed at Long Length of Stay Meetings, dates discussed:    Additional Comments:  Latanya Maudlin, RN 12/05/2017, 8:59 AM

## 2017-12-05 NOTE — Evaluation (Signed)
Physical Therapy Evaluation Patient Details Name: Vicki Morales MRN: [id number] DOB: 08/07/1940 Today's Date: 12/05/2017   History of Present Illness  Pt is a 77 y/o F who presented with SOB and fevers.  Pt found to have pneumonia with sepsis and acute hypoxic respiratory failure.  Of note, pt with hypotensive episode while at the hospital with rapid response called.  Pt with h/o major depression and anxiety requiring Geri psych hospitalization the week prior to admission.  Pt's PMH includes COPD, Bil knee pain, paranoid disorder, tremors of nervous system in hands.    Clinical Impression  Pt admitted with above diagnosis. Pt currently with functional limitations due to the deficits listed below (see PT Problem List). Vicki Morales presents close to her baseline level of mobility.  Pt currently requires min guard assist for sit<>stand transfers for safety and ambulated 40 ft in room using RW.  Pt lives with her daughter who does the cooking, cleaning, driving.  Pt independent with bathing, dressing.  Pt remained on RA throughout session as found (Fortine lying in bed next to the pt).  At end of session pt in chair with 2L O2 as pt was assumed to have this on per the RN.  On RA the pt's SpO2 was at or above 97% with bed mobility.  While ambulating the pulse oximeter read SpO2 as 83%; however pulse not matching up with HR monitor and therefore switched pulse ox to a different finger and SpO2 reading 95% with pulse matching HR monitor.  HR 98-103 at rest, up to 117 with mobility.  Pt will benefit from skilled PT to increase their independence and safety with mobility to allow discharge to the venue listed below.      Follow Up Recommendations Home health PT    Equipment Recommendations  None recommended by PT    Recommendations for Other Services       Precautions / Restrictions Precautions Precautions: Fall;Other (comment) Precaution Comments: monitor O2 Restrictions Weight Bearing Restrictions: No       Mobility  Bed Mobility Overal bed mobility: Needs Assistance Bed Mobility: Supine to Sit     Supine to sit: Supervision;HOB elevated     General bed mobility comments: Pt with heavy use of bed rail.    Transfers Overall transfer level: Needs assistance Equipment used: Rolling walker (2 wheeled) Transfers: Sit to/from Stand Sit to Stand: Min guard         General transfer comment: Pt is slow to rise with mild instability.  Cues to back up all the way to the chair prior to sititng  Ambulation/Gait Ambulation/Gait assistance: Min guard Gait Distance (Feet): 40 Feet Assistive device: Rolling walker (2 wheeled) Gait Pattern/deviations: Decreased step length - right;Decreased step length - left;Trunk flexed;Wide base of support Gait velocity: decreased   General Gait Details: Pt with wide base of support and slightly flexed posture.  Instructed pt to sit after 40 ft as SpO2 reading 83%; however, once switched to new finger SpO2 reading 95% and pulse matching HR monitor.    Stairs            Wheelchair Mobility    Modified Rankin (Stroke Patients Only)       Balance Overall balance assessment: Needs assistance;History of Falls Sitting-balance support: No upper extremity supported;Feet supported Sitting balance-Leahy Scale: Good     Standing balance support: Single extremity supported;During functional activity Standing balance-Leahy Scale: Poor Standing balance comment: Pt relies on at least 1UE support for static and dynamic activities  Pertinent Vitals/Pain Pain Assessment: 0-10 Pain Score: 4  Pain Location: R chest/armpit(when lying supine in bed, resolved within 30 seconds) Pain Descriptors / Indicators: Discomfort Pain Intervention(s): Limited activity within patient's tolerance;Monitored during session    Home Living Family/patient expects to be discharged to:: Private residence Living Arrangements:  Children Available Help at Discharge: Family;Available PRN/intermittently Type of Home: House Home Access: Ramped entrance     Home Layout: One level Home Equipment: Walker - 2 wheels;Cane - single point;Shower seat;Bedside commode;Grab bars - toilet      Prior Function Level of Independence: Independent with assistive device(s)         Comments: Pt ambulates household distances with RW.  Pt had a fall without injury a few days PTA.  Pt independent with bathing, dressing.  Daughter assist with cooking, cleaning, driving.       Hand Dominance        Extremity/Trunk Assessment   Upper Extremity Assessment Upper Extremity Assessment: (BUE strength grossly 4/5)    Lower Extremity Assessment Lower Extremity Assessment: (BLE strength grossly 3+/5)       Communication   Communication: No difficulties  Cognition Arousal/Alertness: Awake/alert Behavior During Therapy: Restless Overall Cognitive Status: Impaired/Different from baseline Area of Impairment: Orientation;Memory;Safety/judgement                 Orientation Level: Disoriented to;Situation;Time   Memory: Decreased short-term memory   Safety/Judgement: Decreased awareness of safety;Decreased awareness of deficits            General Comments General comments (skin integrity, edema, etc.): Pt remained on RA throughout session as found (Vicki Morales lying in bed next to the pt).  At end of session pt in chair with 2L O2 as pt was assumed to have this on per the RN.  On RA the pt's SpO2 was at or above 97% with bed mobility.  While ambulating the pulse oximeter read SpO2 as 83%; however pulse not matching up with HR monitor and therefore switched pulse ox to a different finger and SpO2 reading 95% with pulse matching HR monitor.  HR 98-103 at rest, up to 117 with mobility.     Exercises     Assessment/Plan    PT Assessment Patient needs continued PT services  PT Problem List Decreased strength;Decreased activity  tolerance;Decreased balance;Decreased cognition;Decreased safety awareness;Decreased knowledge of use of DME;Cardiopulmonary status limiting activity       PT Treatment Interventions DME instruction;Gait training;Therapeutic activities;Functional mobility training;Therapeutic exercise;Neuromuscular re-education;Balance training;Cognitive remediation;Patient/family education    PT Goals (Current goals can be found in the Care Plan section)  Acute Rehab PT Goals Patient Stated Goal: to return home PT Goal Formulation: With patient Time For Goal Achievement: 12/19/17 Potential to Achieve Goals: Good    Frequency Min 2X/week   Barriers to discharge        Co-evaluation               AM-PAC PT "6 Clicks" Daily Activity  Outcome Measure Difficulty turning over in bed (including adjusting bedclothes, sheets and blankets)?: Unable Difficulty moving from lying on back to sitting on the side of the bed? : Unable Difficulty sitting down on and standing up from a chair with arms (e.g., wheelchair, bedside commode, etc,.)?: A Little Help needed moving to and from a bed to chair (including a wheelchair)?: A Little Help needed walking in hospital room?: A Little Help needed climbing 3-5 steps with a railing? : A Lot 6 Click Score: 13    End of  Session Equipment Utilized During Treatment: Gait belt;Oxygen Activity Tolerance: Patient tolerated treatment well Patient left: in chair;with call bell/phone within reach;with chair alarm set Nurse Communication: Mobility status;Other (comment)(SpO2, R chest/armpit pain) PT Visit Diagnosis: Unsteadiness on feet (R26.81);Muscle weakness (generalized) (M62.81);History of falling (Z91.81)    Time: 7357-8978 PT Time Calculation (min) (ACUTE ONLY): 23 min   Charges:   PT Evaluation $PT Eval Low Complexity: 1 Low PT Treatments $Therapeutic Activity: 8-22 mins   PT G Codes:        Collie Siad PT, DPT 12/05/2017, 9:03 AM

## 2017-12-05 NOTE — Plan of Care (Signed)
  Problem: Education: Goal: Knowledge of General Education information will improve Outcome: Progressing   Problem: Health Behavior/Discharge Planning: Goal: Ability to manage health-related needs will improve Outcome: Progressing   Problem: Nutrition: Goal: Adequate nutrition will be maintained Outcome: Progressing   Problem: Coping: Goal: Level of anxiety will decrease Outcome: Progressing   Problem: Elimination: Goal: Will not experience complications related to urinary retention Outcome: Progressing   Problem: Pain Managment: Goal: General experience of comfort will improve Outcome: Progressing   Problem: Safety: Goal: Ability to remain free from injury will improve Outcome: Progressing

## 2017-12-06 LAB — CBC WITH DIFFERENTIAL/PLATELET
Basophils Absolute: 0 10*3/uL (ref 0–0.1)
Basophils Relative: 0 %
EOS ABS: 0 10*3/uL (ref 0–0.7)
EOS PCT: 0 %
HCT: 35.1 % (ref 35.0–47.0)
Hemoglobin: 11.8 g/dL — ABNORMAL LOW (ref 12.0–16.0)
LYMPHS ABS: 1.9 10*3/uL (ref 1.0–3.6)
LYMPHS PCT: 14 %
MCH: 31 pg (ref 26.0–34.0)
MCHC: 33.5 g/dL (ref 32.0–36.0)
MCV: 92.4 fL (ref 80.0–100.0)
MONO ABS: 0.6 10*3/uL (ref 0.2–0.9)
MONOS PCT: 4 %
Neutro Abs: 11.2 10*3/uL — ABNORMAL HIGH (ref 1.4–6.5)
Neutrophils Relative %: 82 %
PLATELETS: 188 10*3/uL (ref 150–440)
RBC: 3.8 MIL/uL (ref 3.80–5.20)
RDW: 14.6 % — ABNORMAL HIGH (ref 11.5–14.5)
WBC: 13.7 10*3/uL — ABNORMAL HIGH (ref 3.6–11.0)

## 2017-12-06 LAB — BASIC METABOLIC PANEL
Anion gap: 5 (ref 5–15)
BUN: 31 mg/dL — AB (ref 8–23)
CHLORIDE: 101 mmol/L (ref 98–111)
CO2: 30 mmol/L (ref 22–32)
CREATININE: 0.81 mg/dL (ref 0.44–1.00)
Calcium: 8.9 mg/dL (ref 8.9–10.3)
GFR calc Af Amer: >60 mL/min (ref >60)
GFR calc non Af Amer: >60 mL/min (ref >60)
GLUCOSE: 108 mg/dL — AB (ref 70–99)
POTASSIUM: 4.3 mmol/L (ref 3.5–5.1)
SODIUM: 136 mmol/L (ref 135–145)

## 2017-12-06 MED ORDER — THEOPHYLLINE ER 200 MG PO CP24
200.0000 mg | ORAL_CAPSULE | Freq: Every day | ORAL | Status: DC
  Filled 2017-12-06: qty 1

## 2017-12-06 MED ORDER — PREDNISONE 10 MG (21) PO TBPK: ORAL_TABLET | ORAL | 0 refills | Status: DC

## 2017-12-06 MED ORDER — METOPROLOL TARTRATE 50 MG PO TABS
50.0000 mg | ORAL_TABLET | Freq: Two times a day (BID) | ORAL | Status: DC
  Administered 2017-12-06: 09:00:00 50 mg via ORAL
  Filled 2017-12-06: qty 1

## 2017-12-06 MED ORDER — FUROSEMIDE 20 MG PO TABS: 20.0000 mg | ORAL_TABLET | Freq: Every day | ORAL | 0 refills | Status: DC

## 2017-12-06 MED ORDER — LEVOFLOXACIN 750 MG PO TABS: 750.0000 mg | ORAL_TABLET | Freq: Every day | ORAL | 0 refills | Status: DC

## 2017-12-06 NOTE — Care Management Important Message (Signed)
Important Message  Patient Details  Name: Vicki Morales MRN: [id number] Date of Birth: Feb 15, 1941   Medicare Important Message Given:  Yes    [name] 12/06/2017, 10:01 AM

## 2017-12-06 NOTE — Progress Notes (Signed)
Patient is ready for discharge. She is alert and oriented and vital signs are stable. Patient has all of  her belongings and has had all of her questions regarding her discharge answered. PICC line was removed by charge nurse, Butch Penny, catheter intact. She has been taken off of telemetry and CCMD notified.Patient will be transported home by her daughter. She will leave unit by wheelchair and meet daughter at the front entrance.

## 2017-12-06 NOTE — Progress Notes (Signed)
Physical Therapy Treatment Patient Details Name: Vicki Morales MRN: [id number] DOB: 1941-04-25 Today's Date: 12/06/2017    History of Present Illness Pt is a 77 y/o F who presented with SOB and fevers.  Pt found to have pneumonia with sepsis and acute hypoxic respiratory failure.  Of note, pt with hypotensive episode while at the hospital with rapid response called.  Pt with h/o major depression and anxiety requiring Geri psych hospitalization the week prior to admission.  Pt's PMH includes COPD, Bil knee pain, paranoid disorder, tremors of nervous system in hands.    PT Comments    Pt in bed, ready for session.  Min a to don depends prior to gait.  To edge of bed with min guard.  HOB raised.  Once sitting, able to sit without support.  Stood and was able to ambulate 100' with walker and min a/gaurd +1 with recliner follow.  After seated rest she continued an additional 45' before fatigue.  Sats remained 95% before and after session with some SOB noted.  Discussed discharge plan with pt and daughter.  General deconditioning remains.  Encouraged frequent ambulation at home as daughter reports mom is mostly sedentary at home and does not get up much.  Pt voiced understanding.     Follow Up Recommendations  Home health PT     Equipment Recommendations  None recommended by PT    Recommendations for Other Services       Precautions / Restrictions Precautions Precautions: Fall;Other (comment) Precaution Comments: monitor O2 Restrictions Weight Bearing Restrictions: No    Mobility  Bed Mobility Overal bed mobility: Needs Assistance Bed Mobility: Rolling;Supine to Sit Rolling: Min assist   Supine to sit: Supervision;HOB elevated        Transfers Overall transfer level: Needs assistance Equipment used: Rolling walker (2 wheeled) Transfers: Sit to/from Stand Sit to Stand: Min guard         General transfer comment: cues for hand placements  Ambulation/Gait Ambulation/Gait  assistance: Min guard Gait Distance (Feet): 80 Feet Assistive device: Rolling walker (2 wheeled) Gait Pattern/deviations: Step-through pattern;Decreased step length - right;Decreased step length - left Gait velocity: decreased   General Gait Details: 55' then 63' - limited due to fatigue   Stairs             Wheelchair Mobility    Modified Rankin (Stroke Patients Only)       Balance Overall balance assessment: Needs assistance;History of Falls Sitting-balance support: No upper extremity supported;Feet supported Sitting balance-Leahy Scale: Good     Standing balance support: Bilateral upper extremity supported Standing balance-Leahy Scale: Fair Standing balance comment: relies on walker for support                            Cognition Arousal/Alertness: Awake/alert Behavior During Therapy: WFL for tasks assessed/performed Overall Cognitive Status: Within Functional Limits for tasks assessed                                        Exercises      General Comments        Pertinent Vitals/Pain Pain Assessment: No/denies pain    Home Living                      Prior Function  PT Goals (current goals can now be found in the care plan section) Progress towards PT goals: Progressing toward goals    Frequency    Min 2X/week      PT Plan Current plan remains appropriate    Co-evaluation              AM-PAC PT "6 Clicks" Daily Activity  Outcome Measure  Difficulty turning over in bed (including adjusting bedclothes, sheets and blankets)?: None Difficulty moving from lying on back to sitting on the side of the bed? : A Little Difficulty sitting down on and standing up from a chair with arms (e.g., wheelchair, bedside commode, etc,.)?: A Little Help needed moving to and from a bed to chair (including a wheelchair)?: A Little Help needed walking in hospital room?: A Little Help needed climbing 3-5  steps with a railing? : A Lot 6 Click Score: 18    End of Session Equipment Utilized During Treatment: Gait belt Activity Tolerance: Patient tolerated treatment well Patient left: in chair;with chair alarm set;with call bell/phone within reach;with family/visitor present         Time: 5520-8022 PT Time Calculation (min) (ACUTE ONLY): 34 min  Charges:  $Gait Training: 8-22 mins $Therapeutic Activity: 8-22 mins                    G Codes:       Chesley Noon, PTA 12/06/17, 10:54 AM

## 2017-12-06 NOTE — Care Management (Signed)
Discharge to home today per Dr. Darvin Neighbours. Will be followed by [facility]. [place] from [facility] updated Family will transport. Shelbie Ammons RN MSN CCM Care Management [phone]

## 2017-12-06 NOTE — Progress Notes (Addendum)
Paged hospitalist re: telemetry notification that patient having heart rate in 120s up to 130s with Afib.  0420 Marcille Blanco, MD aware, no new orders at this time.

## 2017-12-07 ENCOUNTER — Ambulatory Visit (INDEPENDENT_AMBULATORY_CARE_PROVIDER_SITE_OTHER): Payer: Medicare HMO | Admitting: Family Medicine

## 2017-12-07 ENCOUNTER — Other Ambulatory Visit
Admission: RE | Admit: 2017-12-07 | Discharge: 2017-12-07 | Disposition: A | Discharge status: Home / Self Care | Payer: Medicare HMO | Source: Ambulatory Visit | Attending: Psychiatry | Admitting: Psychiatry

## 2017-12-07 ENCOUNTER — Encounter: Payer: Self-pay | Admitting: Family Medicine

## 2017-12-07 VITALS — BP 118/66 | HR 105 | Temp 98.6°F | Resp 24

## 2017-12-07 DIAGNOSIS — J449 Chronic obstructive pulmonary disease, unspecified: Secondary | ICD-10-CM

## 2017-12-07 DIAGNOSIS — I5189 Other ill-defined heart diseases: Secondary | ICD-10-CM

## 2017-12-07 DIAGNOSIS — I10 Essential (primary) hypertension: Secondary | ICD-10-CM | POA: Diagnosis not present

## 2017-12-07 DIAGNOSIS — J189 Pneumonia, unspecified organism: Secondary | ICD-10-CM | POA: Diagnosis not present

## 2017-12-07 DIAGNOSIS — R42 Dizziness and giddiness: Secondary | ICD-10-CM | POA: Diagnosis not present

## 2017-12-07 DIAGNOSIS — F209 Schizophrenia, unspecified: Secondary | ICD-10-CM | POA: Diagnosis not present

## 2017-12-07 LAB — URINALYSIS, COMPLETE (UACMP) WITH MICROSCOPIC
BILIRUBIN URINE: NEGATIVE
Bacteria, UA: NONE SEEN
Glucose, UA: NEGATIVE mg/dL
Hgb urine dipstick: NEGATIVE
Ketones, ur: NEGATIVE mg/dL
Nitrite: NEGATIVE
Protein, ur: NEGATIVE mg/dL
SPECIFIC GRAVITY, URINE: 1.012 (ref 1.005–1.030)
pH: 5 (ref 5.0–8.0)

## 2017-12-07 LAB — COMPREHENSIVE METABOLIC PANEL
ALBUMIN: 3.1 g/dL — AB (ref 3.5–5.0)
ALT: 17 U/L (ref 0–44)
ANION GAP: 8 (ref 5–15)
AST: 18 U/L (ref 15–41)
Alkaline Phosphatase: 51 U/L (ref 38–126)
BILIRUBIN TOTAL: 0.5 mg/dL (ref 0.3–1.2)
BUN: 28 mg/dL — AB (ref 8–23)
CO2: 29 mmol/L (ref 22–32)
Calcium: 9.2 mg/dL (ref 8.9–10.3)
Chloride: 99 mmol/L (ref 98–111)
Creatinine, Ser: 0.96 mg/dL (ref 0.44–1.00)
GFR calc Af Amer: >60 mL/min (ref >60)
GFR calc non Af Amer: 55 mL/min — ABNORMAL LOW (ref >60)
GLUCOSE: 138 mg/dL — AB (ref 70–99)
POTASSIUM: 4.7 mmol/L (ref 3.5–5.1)
SODIUM: 136 mmol/L (ref 135–145)
Total Protein: 5.9 g/dL — ABNORMAL LOW (ref 6.5–8.1)

## 2017-12-07 LAB — VALPROIC ACID LEVEL: Valproic Acid Lvl: 43 ug/mL — ABNORMAL LOW (ref 50.0–100.0)

## 2017-12-07 MED ORDER — THEOPHYLLINE ER 200 MG PO CP24: 200.0000 mg | ORAL_CAPSULE | Freq: Every day | ORAL | 5 refills | Status: DC

## 2017-12-07 NOTE — Progress Notes (Signed)
Patient: Vicki Morales Female    DOB: 1941-04-18   77 y.o.   MRN: [id number] Visit Date: 12/07/2017  Today's Provider: Lelon Huh, MD   Chief Complaint  Patient presents with  . Hospitalization Follow-up   Subjective:    HPI   Follow up Hospitalization  Patient was admitted to [facility] on 12/03/2017 and discharged on 12/06/2017. She was treated for Sepsis, HCAP pneumonia (right multilobar pneumonia on cxr 12-03-2017), and altered mental status. Treatment for this included; IV antibiotic.  Also given rx for Levaquin and prednisone upon discharge which she started today.  She was started on furosemide and echocardiogram done due to cardiomegaly with findings of concentric hypertrophy and EF of 55-60%.  She has mild cough and still feel very weak.  She reports good compliance with treatment. She reports this condition is Improved. Still has dizziness, shortness of breath and wheezing.  She is felt dizzy and light headed at home. No falls, but requiring walker. She was prescribed lisinopril at discharge from behavioral health last month, but is also still taking benazepril.   She also is running out of theophylline,but is not sure if she is supposed to be taking 200 or 400mg  tablets.  ------------------------------------------------------------------------------------     Allergies  Allergen Reactions  . Arthrotec [Diclofenac-Misoprostol] Hives  . Codeine Other (See Comments)    Headache   . Diclofenac Other (See Comments) and Hives    Pt unsure allergy  . Hydrochlorothiazide Other (See Comments)    Weakness   . Penicillins Hives    Has patient had a PCN reaction causing immediate rash, facial/tongue/throat swelling, SOB or lightheadedness with hypotension: No Has patient had a PCN reaction causing severe rash involving mucus membranes or skin necrosis: No Has patient had a PCN reaction that required hospitalization: No Has patient had a PCN reaction occurring within the  last 10 years: No If all of the above answers are "NO", then may proceed with Cephalosporin use.  . Sulfa Antibiotics Hives  . Tiotropium Bromide Monohydrate Other (See Comments)    Blurry vision   . Tylenol [Acetaminophen] Other (See Comments)    Doesn't work  . Morphine Hives and Rash  . Pantoprazole Rash     Current Outpatient Medications:  .  albuterol (PROVENTIL) (2.5 MG/3ML) 0.083% nebulizer solution, Take 3 mLs (2.5 mg total) by nebulization 2 (two) times daily as needed for wheezing or shortness of breath., Disp: 75 mL, Rfl: 5 .  aspirin EC 325 MG tablet, Take 1 tablet (325 mg total) by mouth daily. (Patient taking differently: Take 325 mg by mouth at bedtime. ), Disp: 30 tablet, Rfl: 2 .  benazepril (LOTENSIN) 5 MG tablet, TAKE 1 TABLET(5 MG) BY MOUTH DAILY, Disp: 30 tablet, Rfl: 11 .  busPIRone (BUSPAR) 10 MG tablet, Take 1 tablet (10 mg total) by mouth 3 (three) times daily., Disp: 90 tablet, Rfl: 0 .  Calcium Carbonate-Vitamin D (CALCIUM 600+D) 600-400 MG-UNIT tablet, Take 1 tablet by mouth 2 (two) times daily. , Disp: , Rfl:  .  Cranberry 250 MG CAPS, Take 1 capsule by mouth daily. , Disp: , Rfl:  .  divalproex (DEPAKOTE) 250 MG DR tablet, Take 1 tablet (250 mg total) by mouth 2 (two) times daily., Disp: 60 tablet, Rfl: 1 .  fluticasone (FLONASE) 50 MCG/ACT nasal spray, Place 1 spray into both nostrils daily., Disp: , Rfl:  .  Fluticasone-Salmeterol (WIXELA INHUB) 250-50 MCG/DOSE AEPB, Inhale 1 puff into the lungs 2 (two)  times daily., Disp: , Rfl:  .  Fluticasone-Umeclidin-Vilant (TRELEGY ELLIPTA) 100-62.5-25 MCG/INH AEPB, Inhale 1 puff into the lungs daily., Disp: 1 each, Rfl: 2 .  furosemide (LASIX) 20 MG tablet, Take 1 tablet (20 mg total) by mouth daily., Disp: 30 tablet, Rfl: 0 .  gabapentin (NEURONTIN) 100 MG capsule, TAKE 1 CAPSULE(100 MG) BY MOUTH TWICE DAILY, Disp: 60 capsule, Rfl: 11 .  gabapentin (NEURONTIN) 300 MG capsule, TAKE 2 CAPSULES(600 MG) BY MOUTH AT  BEDTIME, Disp: 60 capsule, Rfl: 11 .  hydrOXYzine (ATARAX/VISTARIL) 25 MG tablet, TAKE 1 TABLET(25 MG) BY MOUTH EVERY 8 HOURS AS NEEDED FOR ANXIETY, Disp: 60 tablet, Rfl: 5 .  levofloxacin (LEVAQUIN) 750 MG tablet, Take 1 tablet (750 mg total) by mouth daily., Disp: 5 tablet, Rfl: 0 .  lisinopril (PRINIVIL,ZESTRIL) 5 MG tablet, Take 1 tablet by mouth daily., Disp: , Rfl: 0 .  loratadine (CLARITIN) 10 MG tablet, Take 10 mg by mouth daily., Disp: , Rfl:  .  magnesium oxide (MAG-OX) 400 MG tablet, Take 400 mg by mouth 2 (two) times daily., Disp: , Rfl:  .  Melatonin 10 MG CAPS, Take 10 mg by mouth at bedtime as needed., Disp: , Rfl:  .  meloxicam (MOBIC) 15 MG tablet, TAKE 1 TABLET BY MOUTH EVERY DAY WITH A MEALS, Disp: , Rfl:  .  Misc Natural Products (OSTEO BI-FLEX ADV DOUBLE ST PO), Take 1 tablet by mouth 2 (two) times daily. , Disp: , Rfl:  .  montelukast (SINGULAIR) 10 MG tablet, TAKE 1 TABLET BY MOUTH EVERY DAY, Disp: 30 tablet, Rfl: 12 .  Multiple Vitamin (MULTIVITAMIN WITH MINERALS) TABS tablet, Take 1 tablet by mouth daily., Disp: , Rfl:  .  Omega-3 Fatty Acids (FISH OIL) 1000 MG CAPS, Take 1,000 mg by mouth daily. , Disp: , Rfl:  .  oxybutynin (DITROPAN-XL) 10 MG 24 hr tablet, TAKE 1 TABLET BY MOUTH AT BEDTIME, Disp: 30 tablet, Rfl: 5 .  predniSONE (STERAPRED UNI-PAK 21 TAB) 10 MG (21) TBPK tablet, 6 tabs day 1 and taper 10 mg a day - 6 days, Disp: 21 tablet, Rfl: 0 .  risperiDONE (RISPERDAL) 1 MG tablet, Take 1 tablet (1 mg total) by mouth at bedtime. (Patient taking differently: Take 2 mg by mouth at bedtime. ), Disp: 30 tablet, Rfl: 1 .  theophylline (UNIPHYL) 400 MG 24 hr tablet, TAKE 1 TABLET BY MOUTH DAILY. (Patient taking differently: TAKE 200MG  BY MOUTH DAILY.), Disp: 30 tablet, Rfl: 12 .  tiZANidine (ZANAFLEX) 4 MG tablet, Take 1 tablet (4 mg total) by mouth every 8 (eight) hours as needed for muscle spasms., Disp: 30 tablet, Rfl: 5 .  traMADol (ULTRAM) 50 MG tablet, TAKE 2 TABLETS  BY MOUTH TWICE DAILY, Disp: 60 tablet, Rfl: 0 .  traZODone (DESYREL) 50 MG tablet, Take 0.5-1 tablets (25-50 mg total) by mouth at bedtime as needed for sleep., Disp: 90 tablet, Rfl: 1  Review of Systems  Constitutional: Positive for fatigue. Negative for appetite change, chills and fever.  Respiratory: Positive for shortness of breath and wheezing. Negative for chest tightness.   Cardiovascular: Negative for chest pain and palpitations.  Gastrointestinal: Negative for abdominal pain, nausea and vomiting.  Neurological: Positive for dizziness. Negative for weakness.    Social History   Tobacco Use  . Smoking status: Former Smoker    Years: 30.00  . Smokeless tobacco: Never Used  . Tobacco comment: quit in 1989  Substance Use Topics  . Alcohol use: No  Alcohol/week: 0.0 oz   Objective:   BP 118/66 (BP Location: Left Arm, Patient Position: Sitting, Cuff Size: Large)   Pulse (!) 105   Temp 98.6 F (37 C) (Oral)   Resp (!) 24   SpO2 92% Comment: room air Vitals:   12/07/17 1432  BP: 118/66  Pulse: (!) 105  Resp: (!) 24  Temp: 98.6 F (37 C)  TempSrc: Oral  SpO2: 92%     Physical Exam  General Appearance:    Alert, cooperative, no distress  HENT:   ENT exam normal, no neck nodes or sinus tenderness  Eyes:    PERRL, conjunctiva/corneas clear, EOM's intact       Lungs:     Clear to auscultation bilaterally, respirations unlabored  Heart:    Regular rate and rhythm  Neurologic:   Awake, alert, oriented x 3. No apparent focal neurological           defect.          Assessment & Plan:     1. Pneumonia of right lung due to infectious organism, unspecified part of lung clinically improved. Finish prednisone and levofloxacin - Ambulatory referral to [facility]   2. Dizziness She has been taking both benazepril and lisinopril since discharge. She is to discontinue lisinopril. Addition of furosemide may be contributing to dizziness. Is fall risk at home and home bound  to limited mobility.  - Ambulatory referral to [place] - CBC - Comprehensive metabolic panel  3. Diastolic dysfunction Recently diagnosed. Have added furosemide at recent hospitalization. Continue benazepril.  - Comprehensive metabolic panel  4. Essential (primary) hypertension Well controlled. Continue benazepril and d/c lisinopril as above.   5. Moderate COPD (chronic obstructive pulmonary disease) ([place]) Had been taking 1/2 of 400mg  theophylline. Has been doing much better since change to trelegy. Work on reducing theophylline dose.  - theophylline (THEO-24) 200 MG 24 hr capsule; Take 1 capsule (200 mg total) by mouth daily.  Dispense: 30 capsule; Refill: 5  6. Schizophrenia, in remission [facility]) Continue current psychiatric medications and follow up .  Return in about 2 weeks (around 12/21/2017).        Lelon Huh, MD  [facility]

## 2017-12-08 DIAGNOSIS — I5189 Other ill-defined heart diseases: Secondary | ICD-10-CM | POA: Insufficient documentation

## 2017-12-08 LAB — URINE CULTURE: Culture: NO GROWTH

## 2017-12-08 LAB — CULTURE, BLOOD (ROUTINE X 2)
CULTURE: NO GROWTH
Culture: NO GROWTH
SPECIAL REQUESTS: ADEQUATE

## 2017-12-09 ENCOUNTER — Ambulatory Visit (INDEPENDENT_AMBULATORY_CARE_PROVIDER_SITE_OTHER): Payer: BLUE CROSS/BLUE SHIELD | Admitting: Psychiatry

## 2017-12-09 ENCOUNTER — Encounter: Payer: Self-pay | Admitting: Psychiatry

## 2017-12-09 ENCOUNTER — Other Ambulatory Visit: Payer: Self-pay

## 2017-12-09 VITALS — BP 154/85 | HR 71 | Temp 97.8°F

## 2017-12-09 DIAGNOSIS — F200 Paranoid schizophrenia: Secondary | ICD-10-CM

## 2017-12-09 DIAGNOSIS — F410 Panic disorder [episodic paroxysmal anxiety] without agoraphobia: Secondary | ICD-10-CM | POA: Diagnosis not present

## 2017-12-09 DIAGNOSIS — F331 Major depressive disorder, recurrent, moderate: Secondary | ICD-10-CM

## 2017-12-09 MED ORDER — BUSPIRONE HCL 10 MG PO TABS: 10.0000 mg | ORAL_TABLET | Freq: Three times a day (TID) | ORAL | 0 refills | Status: DC

## 2017-12-09 MED ORDER — RISPERIDONE 1 MG PO TABS: 1.0000 mg | ORAL_TABLET | Freq: Every day | ORAL | 1 refills | Status: DC

## 2017-12-09 MED ORDER — DIVALPROEX SODIUM 250 MG PO DR TAB: 250.0000 mg | DELAYED_RELEASE_TABLET | Freq: Two times a day (BID) | ORAL | 1 refills | Status: DC

## 2017-12-09 NOTE — Progress Notes (Signed)
[facility] OP Progress Note  12/09/2017 10:28 AM Vicki Morales  MRN:  [id number]  Chief Complaint: ' I am here for follow up.' Chief Complaint    Follow-up; Medication Refill     HPI: Vicki Morales is a 77 yr old female, widowed, retired, lives in [place], presented to the clinic today with her daughter.  Patient today reports that she was just discharged from the hospital for a pneumonia.  Continues to be on medications for the same.  Patient today seems to be having mild cough and congestion during the time that she was in the session.  She however reports she has been using albuterol nebulization which has been helpful.  Patient continues to struggle with sleep.  She reports she has to take the trazodone along with her other medication and she continues to have some restlessness sometimes.  Discussed with patient it could be because of her respiratory problems as well as that she stays in bed during the daytime.  Patient continues to take the risperidone at a low dose of 1 mg and the Depakote.  She denies any side effects.  Discussed recent labs with patient.Depakote level - 43 - as well as UA with Urine Clx - wnl.  This was discussed with patient that , even though Depakote level is subtherapeutic will not make any changes today since she is currently struggling with respiratory infection/pneumonia and she is on multiple medications.  We will continue to monitor her closely and reassess. Visit Diagnosis:    ICD-10-CM   1. Schizophrenia, paranoid ([place]) F20.0 risperiDONE (RISPERDAL) 1 MG tablet  2. Panic attacks F41.0   3. MDD (major depressive disorder), recurrent episode, moderate ([place]) F33.1     Past Psychiatric History: Reviewed past psychiatric history from my progress note on 09/05/2017.  Past Medical History:  Past Medical History:  Diagnosis Date  . Anxiety   . Arthritis   . Cataract    right eye  . COPD (chronic obstructive pulmonary disease) ([place])   . Depression   . Dyspnea    DOE  . Dysrhythmia   . Edema    FEET/ANKLES  . GERD (gastroesophageal reflux disease)   . H/O wheezing   . History of hiatal hernia   . HOH (hard of hearing)   . Hypertension   . Incontinence of urine in female   . Orthopnea   . Pain    CHRONIC KNEE  . Pain    CHRONIC KNEE  . Paranoid disorder ([place])   . RLS (restless legs syndrome)   . Tremors of nervous system    HANDS    Past Surgical History:  Procedure Laterality Date  . ABDOMINAL HYSTERECTOMY  1988  . APPENDECTOMY  1988  . CARDIAC CATHETERIZATION    . CATARACT EXTRACTION W/PHACO Right 07/14/2016   Procedure: CATARACT EXTRACTION PHACO AND INTRAOCULAR LENS PLACEMENT ([place]) anterior vitrectomy;  Surgeon: Estill Cotta, MD;  Location: [facility] ORS;  Service: Ophthalmology;  Laterality: Right;  [place] 02:40 AP% 24.4 CDE 59.98 Fluid pack # [id number]  . CATARACT EXTRACTION W/PHACO Left 02/16/2017   Procedure: CATARACT EXTRACTION PHACO AND INTRAOCULAR LENS PLACEMENT (IOC);  Surgeon: Estill Cotta, MD;  Location: [facility] ORS;  Service: Ophthalmology;  Laterality: Left;  Lot # [id number] H [place]: 01:11.8 AP%: 23.5 CDE: 32.09   . ESOPHAGOGASTRODUODENOSCOPY N/A 11/11/2014   Procedure: ESOPHAGOGASTRODUODENOSCOPY (EGD);  Surgeon: Josefine Class, MD;  Location[facility] ENDOSCOPY;  Service: Endoscopy;  Laterality: N/A;  . TONSILLECTOMY AND ADENOIDECTOMY    . TRIGGER FINGER  RELEASE  2004    Family Psychiatric History: Reviewed family psychiatric history from my progress note on 09/05/2017.  Family History:  Family History  Problem Relation Age of Onset  . Breast cancer Sister   . Stroke Father   . Emphysema Father   . Anxiety disorder Father   . Depression Father   . Anxiety disorder Brother    Substance abuse history: Denies  Social History: Have reviewed social history from my progress note on 09/05/2017. Social History   Socioeconomic History  . Marital status: Widowed    Spouse name: Not on file  . Number of children: 2  . Years  of education: Not on file  . Highest education level: 12th grade  Occupational History  . 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 Financial resource strain: Not hard at all  . Food insecurity:    Worry: Never true    Inability: Never true  . Transportation needs:    Medical: No    Non-medical: No  Tobacco Use  . Smoking status: Former Smoker    Years: 30.00  . Smokeless tobacco: Never Used  . Tobacco comment: quit in 1989  Substance and Sexual Activity  . Alcohol use: No    Alcohol/week: 0.0 oz  . Drug use: No  . Sexual activity: Not Currently  Lifestyle  . Physical activity:    Days per week: 0 days    Minutes per session: 0 min  . Stress: Only a little  Relationships  . Social connections:    Talks on phone: Not on file    Gets together: Not on file    Attends religious service: Not on file    Active member of club or organization: Not on file    Attends meetings of clubs or organizations: Not on file    Relationship status: Not on file  Other Topics Concern  . Not on file  Social History Narrative   Lives at home with her daughter.  Ambulates with a walker at baseline    Allergies:  Allergies  Allergen Reactions  . Arthrotec [Diclofenac-Misoprostol] Hives  . Codeine Other (See Comments)    Headache   . Diclofenac Other (See Comments) and Hives    Pt unsure allergy  . Hydrochlorothiazide Other (See Comments)    Weakness   . Penicillins Hives    Has patient had a PCN reaction causing immediate rash, facial/tongue/throat swelling, SOB or lightheadedness with hypotension: No Has patient had a PCN reaction causing severe rash involving mucus membranes or skin necrosis: No Has patient had a PCN reaction that required hospitalization: No Has patient had a PCN reaction occurring within the last 10 years: No If all of the above answers are "NO", then may proceed with Cephalosporin use.  . Sulfa Antibiotics Hives  . Tiotropium Bromide Monohydrate Other  (See Comments)    Blurry vision   . Tylenol [Acetaminophen] Other (See Comments)    Doesn't work  . Morphine Hives and Rash  . Pantoprazole Rash    Metabolic Disorder Labs: Lab Results  Component Value Date   HGBA1C 5.1 09/10/2015   No results found for: PROLACTIN Lab Results  Component Value Date   CHOL 207 (H) 08/20/2016   TRIG 66 08/20/2016   HDL 80 08/20/2016   CHOLHDL 2.6 08/20/2016   VLDL 12 11/01/2012   LDLCALC 114 (H) 08/20/2016   LDLCALC 132 03/27/2014   Lab Results  Component Value Date   TSH 0.771 12/03/2017  TSH 1.303 05/12/2017    Therapeutic Level Labs: No results found for: LITHIUM Lab Results  Component Value Date   VALPROATE 43 (L) 12/07/2017   No components found for:  CBMZ  Current Medications: Current Outpatient Medications  Medication Sig Dispense Refill  . albuterol (PROVENTIL) (2.5 MG/3ML) 0.083% nebulizer solution Take 3 mLs (2.5 mg total) by nebulization 2 (two) times daily as needed for wheezing or shortness of breath. 75 mL 5  . aspirin EC 325 MG tablet Take 1 tablet (325 mg total) by mouth daily. (Patient taking differently: Take 325 mg by mouth at bedtime. ) 30 tablet 2  . benazepril (LOTENSIN) 5 MG tablet TAKE 1 TABLET(5 MG) BY MOUTH DAILY 30 tablet 11  . busPIRone (BUSPAR) 10 MG tablet Take 1 tablet (10 mg total) by mouth 3 (three) times daily. 270 tablet 0  . Calcium Carbonate-Vitamin D (CALCIUM 600+D) 600-400 MG-UNIT tablet Take 1 tablet by mouth 2 (two) times daily.     . Cranberry 250 MG CAPS Take 1 capsule by mouth daily.     . divalproex (DEPAKOTE) 250 MG DR tablet Take 1 tablet (250 mg total) by mouth 2 (two) times daily. 180 tablet 1  . fluticasone (FLONASE) 50 MCG/ACT nasal spray Place 1 spray into both nostrils daily.    . Fluticasone-Salmeterol (WIXELA INHUB) 250-50 MCG/DOSE AEPB Inhale 1 puff into the lungs 2 (two) times daily.    . Fluticasone-Umeclidin-Vilant (TRELEGY ELLIPTA) 100-62.5-25 MCG/INH AEPB Inhale 1 puff into  the lungs daily. 1 each 2  . furosemide (LASIX) 20 MG tablet Take 1 tablet (20 mg total) by mouth daily. 30 tablet 0  . gabapentin (NEURONTIN) 100 MG capsule TAKE 1 CAPSULE(100 MG) BY MOUTH TWICE DAILY 60 capsule 11  . gabapentin (NEURONTIN) 300 MG capsule TAKE 2 CAPSULES(600 MG) BY MOUTH AT BEDTIME 60 capsule 11  . hydrOXYzine (ATARAX/VISTARIL) 25 MG tablet TAKE 1 TABLET(25 MG) BY MOUTH EVERY 8 HOURS AS NEEDED FOR ANXIETY 60 tablet 5  . levofloxacin (LEVAQUIN) 750 MG tablet Take 1 tablet (750 mg total) by mouth daily. 5 tablet 0  . loratadine (CLARITIN) 10 MG tablet Take 10 mg by mouth daily.    . magnesium oxide (MAG-OX) 400 MG tablet Take 400 mg by mouth 2 (two) times daily.    . Melatonin 10 MG CAPS Take 10 mg by mouth at bedtime as needed.    . meloxicam (MOBIC) 15 MG tablet TAKE 1 TABLET BY MOUTH EVERY DAY WITH A MEALS    . Misc Natural Products (OSTEO BI-FLEX ADV DOUBLE ST PO) Take 1 tablet by mouth 2 (two) times daily.     . montelukast (SINGULAIR) 10 MG tablet TAKE 1 TABLET BY MOUTH EVERY DAY 30 tablet 12  . Multiple Vitamin (MULTIVITAMIN WITH MINERALS) TABS tablet Take 1 tablet by mouth daily.    . Omega-3 Fatty Acids (FISH OIL) 1000 MG CAPS Take 1,000 mg by mouth daily.     [name] Kitchen oxybutynin (DITROPAN-XL) 10 MG 24 hr tablet TAKE 1 TABLET BY MOUTH AT BEDTIME 30 tablet 5  . predniSONE (STERAPRED UNI-PAK 21 TAB) 10 MG (21) TBPK tablet 6 tabs day 1 and taper 10 mg a day - 6 days 21 tablet 0  . risperiDONE (RISPERDAL) 1 MG tablet Take 1 tablet (1 mg total) by mouth at bedtime. 90 tablet 1  . theophylline (THEO-24) 200 MG 24 hr capsule Take 1 capsule (200 mg total) by mouth daily. 30 capsule 5  . tiZANidine (ZANAFLEX) 4 MG tablet  Take 1 tablet (4 mg total) by mouth every 8 (eight) hours as needed for muscle spasms. 30 tablet 5  . traMADol (ULTRAM) 50 MG tablet TAKE 2 TABLETS BY MOUTH TWICE DAILY 60 tablet 0  . traZODone (DESYREL) 50 MG tablet Take 0.5-1 tablets (25-50 mg total) by mouth at  bedtime as needed for sleep. 90 tablet 1  . predniSONE (DELTASONE) 10 MG tablet   0  . theophylline (UNIPHYL) 400 MG 24 hr tablet   0   No current facility-administered medications for this visit.      Musculoskeletal: Strength & Muscle Tone: within normal limits Gait & Station: in a wheelchair Patient leans: N/A  Psychiatric Specialty Exam: Review of Systems  HENT: Positive for congestion.   Respiratory: Positive for cough and sputum production.   Psychiatric/Behavioral: Positive for depression (improving). The patient is nervous/anxious and has insomnia.   All other systems reviewed and are negative.   Blood pressure (!) 154/85, pulse 71, temperature 97.8 F (36.6 C), temperature source Oral.There is no height or weight on file to calculate BMI.  General Appearance: Casual  Eye Contact:  Fair  Speech:  Clear and Coherent  Volume:  Normal  Mood:  Anxious  Affect:  Appropriate  Thought Process:  Goal Directed and Descriptions of Associations: Intact  Orientation:  Full (Time, Place, and Person)  Thought Content: Logical   Suicidal Thoughts:  No  Homicidal Thoughts:  No  Memory:  Immediate;   Fair Recent;   Fair Remote;   Fair  Judgement:  Fair  Insight:  Fair  Psychomotor Activity:  Decreased  Concentration:  Concentration: Fair and Attention Span: Fair  Recall:  [employer] of Knowledge: Fair  Language: Fair  Akathisia:  No  Handed:  Right  AIMS (if indicated): denies tremors, rigidity, stiffness  Assets:  Communication Skills Desire for Improvement  ADL's:  Intact  Cognition: WNL  Sleep:  restless on and off    Screenings: PHQ2-9     Clinical Support from 09/20/2017 in [place] from 08/20/2016 in [place] Visit from 03/18/2015 in [place]  PHQ-2 Total Score  1  0  0  PHQ-9 Total Score  7  7  3        Assessment and Plan: Emmajane is a 77 year old Caucasian female who has a history of  schizophrenia, depression, presented to the clinic today for a follow-up visit. Patient today reports she she is currently recovering from pneumonia and was admitted to the hospital and discharged 2 days ago.  Patient continues to be on medications for the same.  Patient continues to struggle with some respiratory symptoms.  She denies any significant mood symptoms other than her anxiety about her medical problems at this time.  We will continue medications as noted below.  Plan MDD in full remission Continue BuSpar 10 mg 3 times daily.  Panic attacks Continue BuSpar 10 mg 3 times daily.  For schizophrenia Continue risperidone at a reduced dose of 1 mg p.o. nightly Continue Depakote 250 mg twice a day. Depakote level was reviewed- 43 on 12/07/2017.  Subtherapeutic.  However will not make any changes today.  For insomnia Trazodone 25-50 mg p.o. nightly as needed Discussed the side effects of medications like trazodone which can cause orthostatic hypotension, dizziness.  Follow-up in clinic in 1 month.  More than 50 % of the time was spent for psychoeducation and supportive psychotherapy and care coordination.  This note was generated in  part or whole with voice recognition software. Voice recognition is usually quite accurate but there are transcription errors that can and very often do occur. I apologize for any typographical errors that were not detected and corrected.          Ursula Alert, MD 12/09/2017, 10:28 AM

## 2017-12-11 ENCOUNTER — Emergency Department
Admission: EM | Admit: 2017-12-11 | Discharge: 2017-12-14 | Disposition: A | Discharge status: Other Acute Inpatient Hospital | Payer: Medicare HMO | Attending: Emergency Medicine | Admitting: Emergency Medicine

## 2017-12-11 ENCOUNTER — Emergency Department: Payer: Medicare HMO

## 2017-12-11 ENCOUNTER — Other Ambulatory Visit: Payer: Self-pay

## 2017-12-11 DIAGNOSIS — Z9181 History of falling: Secondary | ICD-10-CM | POA: Diagnosis not present

## 2017-12-11 DIAGNOSIS — Z79899 Other long term (current) drug therapy: Secondary | ICD-10-CM | POA: Insufficient documentation

## 2017-12-11 DIAGNOSIS — M6281 Muscle weakness (generalized): Secondary | ICD-10-CM | POA: Insufficient documentation

## 2017-12-11 DIAGNOSIS — I10 Essential (primary) hypertension: Secondary | ICD-10-CM | POA: Diagnosis not present

## 2017-12-11 DIAGNOSIS — Z7982 Long term (current) use of aspirin: Secondary | ICD-10-CM | POA: Diagnosis not present

## 2017-12-11 DIAGNOSIS — F209 Schizophrenia, unspecified: Secondary | ICD-10-CM | POA: Diagnosis not present

## 2017-12-11 DIAGNOSIS — R531 Weakness: Secondary | ICD-10-CM | POA: Diagnosis not present

## 2017-12-11 DIAGNOSIS — R079 Chest pain, unspecified: Secondary | ICD-10-CM | POA: Diagnosis not present

## 2017-12-11 DIAGNOSIS — R0602 Shortness of breath: Secondary | ICD-10-CM | POA: Diagnosis not present

## 2017-12-11 DIAGNOSIS — R2681 Unsteadiness on feet: Secondary | ICD-10-CM | POA: Diagnosis not present

## 2017-12-11 DIAGNOSIS — Z87891 Personal history of nicotine dependence: Secondary | ICD-10-CM | POA: Diagnosis not present

## 2017-12-11 LAB — URINALYSIS, COMPLETE (UACMP) WITH MICROSCOPIC
Bacteria, UA: NONE SEEN
Bilirubin Urine: NEGATIVE
Glucose, UA: NEGATIVE mg/dL
Hgb urine dipstick: NEGATIVE
KETONES UR: NEGATIVE mg/dL
Leukocytes, UA: NEGATIVE
Nitrite: NEGATIVE
PROTEIN: NEGATIVE mg/dL
Specific Gravity, Urine: 1.003 — ABNORMAL LOW (ref 1.005–1.030)
WBC UA: NONE SEEN WBC/hpf (ref 0–5)
pH: 8 (ref 5.0–8.0)

## 2017-12-11 LAB — COMPREHENSIVE METABOLIC PANEL
ALBUMIN: 2.9 g/dL — AB (ref 3.5–5.0)
ALT: 15 U/L (ref 0–44)
ANION GAP: 7 (ref 5–15)
AST: 22 U/L (ref 15–41)
Alkaline Phosphatase: 45 U/L (ref 38–126)
BILIRUBIN TOTAL: 0.9 mg/dL (ref 0.3–1.2)
BUN: 15 mg/dL (ref 8–23)
CALCIUM: 8.7 mg/dL — AB (ref 8.9–10.3)
CO2: 32 mmol/L (ref 22–32)
Chloride: 98 mmol/L (ref 98–111)
Creatinine, Ser: 0.75 mg/dL (ref 0.44–1.00)
GFR calc Af Amer: >60 mL/min (ref >60)
GFR calc non Af Amer: >60 mL/min (ref >60)
GLUCOSE: 74 mg/dL (ref 70–99)
Potassium: 5.2 mmol/L — ABNORMAL HIGH (ref 3.5–5.1)
Sodium: 137 mmol/L (ref 135–145)
TOTAL PROTEIN: 5.1 g/dL — AB (ref 6.5–8.1)

## 2017-12-11 LAB — CBC WITH DIFFERENTIAL/PLATELET
BASOS ABS: 0 10*3/uL (ref 0–0.1)
Basophils Relative: 0 %
Eosinophils Absolute: 0.1 10*3/uL (ref 0–0.7)
Eosinophils Relative: 1 %
HCT: 37.7 % (ref 35.0–47.0)
Hemoglobin: 12.7 g/dL (ref 12.0–16.0)
Lymphocytes Relative: 39 %
Lymphs Abs: 4.2 10*3/uL — ABNORMAL HIGH (ref 1.0–3.6)
MCH: 31.6 pg (ref 26.0–34.0)
MCHC: 33.6 g/dL (ref 32.0–36.0)
MCV: 94.2 fL (ref 80.0–100.0)
MONO ABS: 1.1 10*3/uL — AB (ref 0.2–0.9)
MONOS PCT: 10 %
NEUTROS PCT: 50 %
Neutro Abs: 5.4 10*3/uL (ref 1.4–6.5)
PLATELETS: 169 10*3/uL (ref 150–440)
RBC: 4 MIL/uL (ref 3.80–5.20)
RDW: 14.7 % — ABNORMAL HIGH (ref 11.5–14.5)
WBC: 10.8 10*3/uL (ref 3.6–11.0)

## 2017-12-11 LAB — LACTIC ACID, PLASMA: Lactic Acid, Venous: 0.9 mmol/L (ref 0.5–1.9)

## 2017-12-11 LAB — LIPASE, BLOOD: Lipase: 20 U/L (ref 11–51)

## 2017-12-11 LAB — TROPONIN I: Troponin I: <0.03 ng/mL (ref <0.03)

## 2017-12-11 MED ORDER — GABAPENTIN 100 MG PO CAPS
100.0000 mg | ORAL_CAPSULE | Freq: Two times a day (BID) | ORAL | Status: DC
  Administered 2017-12-12 – 2017-12-14 (×5): 100 mg via ORAL
  Filled 2017-12-11 (×8): qty 1

## 2017-12-11 MED ORDER — LEVOFLOXACIN 750 MG PO TABS
750.0000 mg | ORAL_TABLET | Freq: Every day | ORAL | Status: AC
  Administered 2017-12-12 (×2): 750 mg via ORAL
  Filled 2017-12-11 (×2): qty 1

## 2017-12-11 MED ORDER — FUROSEMIDE 40 MG PO TABS
20.0000 mg | ORAL_TABLET | Freq: Every day | ORAL | Status: DC
  Administered 2017-12-12 – 2017-12-14 (×3): 20 mg via ORAL
  Filled 2017-12-11 (×3): qty 1

## 2017-12-11 MED ORDER — MELATONIN 5 MG PO TABS
10.0000 mg | ORAL_TABLET | Freq: Every evening | ORAL | Status: DC | PRN
  Administered 2017-12-14: 10 mg via ORAL
  Filled 2017-12-11 (×2): qty 2

## 2017-12-11 MED ORDER — BUSPIRONE HCL 5 MG PO TABS
10.0000 mg | ORAL_TABLET | Freq: Three times a day (TID) | ORAL | Status: DC
  Administered 2017-12-11 – 2017-12-14 (×8): 10 mg via ORAL
  Filled 2017-12-11 (×8): qty 2

## 2017-12-11 MED ORDER — SODIUM CHLORIDE 0.9 % IV BOLUS
1000.0000 mL | Freq: Once | INTRAVENOUS | Status: AC
  Administered 2017-12-11: 1000 mL via INTRAVENOUS

## 2017-12-11 MED ORDER — BENAZEPRIL HCL 5 MG PO TABS
5.0000 mg | ORAL_TABLET | Freq: Every day | ORAL | Status: DC
  Administered 2017-12-11 – 2017-12-14 (×4): 5 mg via ORAL
  Filled 2017-12-11 (×4): qty 1

## 2017-12-11 MED ORDER — DIVALPROEX SODIUM 250 MG PO DR TAB
250.0000 mg | DELAYED_RELEASE_TABLET | Freq: Two times a day (BID) | ORAL | Status: DC
  Administered 2017-12-11 – 2017-12-14 (×6): 250 mg via ORAL
  Filled 2017-12-11 (×6): qty 1

## 2017-12-11 MED ORDER — THEOPHYLLINE ER 100 MG PO CP24
200.0000 mg | ORAL_CAPSULE | Freq: Every day | ORAL | Status: DC
  Administered 2017-12-12 – 2017-12-14 (×3): 200 mg via ORAL
  Filled 2017-12-11 (×2): qty 2
  Filled 2017-12-11: qty 1
  Filled 2017-12-11: qty 2
  Filled 2017-12-11: qty 1

## 2017-12-11 MED ORDER — ONDANSETRON HCL 4 MG/2ML IJ SOLN
4.0000 mg | Freq: Once | INTRAMUSCULAR | Status: AC
  Administered 2017-12-11: 4 mg via INTRAVENOUS
  Filled 2017-12-11: qty 2

## 2017-12-11 MED ORDER — GABAPENTIN 300 MG PO CAPS
600.0000 mg | ORAL_CAPSULE | Freq: Every day | ORAL | Status: DC
  Administered 2017-12-12 – 2017-12-13 (×3): 600 mg via ORAL
  Filled 2017-12-11 (×6): qty 2

## 2017-12-11 MED ORDER — TRAZODONE HCL 50 MG PO TABS
25.0000 mg | ORAL_TABLET | Freq: Every evening | ORAL | Status: DC | PRN
  Administered 2017-12-13: 50 mg via ORAL
  Filled 2017-12-11: qty 1

## 2017-12-11 MED ORDER — RISPERIDONE 1 MG PO TABS
1.0000 mg | ORAL_TABLET | Freq: Every day | ORAL | Status: DC
  Administered 2017-12-11 – 2017-12-13 (×3): 1 mg via ORAL
  Filled 2017-12-11 (×3): qty 1

## 2017-12-11 MED ORDER — OXYBUTYNIN CHLORIDE ER 10 MG PO TB24
10.0000 mg | ORAL_TABLET | Freq: Every day | ORAL | Status: DC
  Administered 2017-12-11 – 2017-12-13 (×3): 10 mg via ORAL
  Filled 2017-12-11 (×5): qty 1

## 2017-12-11 MED ORDER — MELOXICAM 7.5 MG PO TABS
15.0000 mg | ORAL_TABLET | Freq: Every day | ORAL | Status: DC
  Administered 2017-12-12 – 2017-12-14 (×3): 15 mg via ORAL
  Filled 2017-12-11 (×3): qty 2

## 2017-12-11 NOTE — ED Provider Notes (Signed)
[facility] Emergency Department Provider Note ____________________________________________   First MD Initiated Contact with Patient 12/11/17 1405     (approximate)  I have reviewed the triage vital signs and the nursing notes.   HISTORY  Chief Complaint Chest Pain    HPI Vicki Morales is a 77 y.o. female with PMH as noted below who presents with multiple symptoms, but primarily with pain underneath her right breast over the last several days.  It is worse with certain positions.  She also reports generalized weakness and increased shortness of breath.  She denies abdominal pain.  She reports nausea but no vomiting.  Past Medical History:  Diagnosis Date  . Anxiety   . Arthritis   . Cataract    right eye  . COPD (chronic obstructive pulmonary disease) ([place])   . Depression   . Dyspnea    DOE  . Dysrhythmia   . Edema    FEET/ANKLES  . GERD (gastroesophageal reflux disease)   . H/O wheezing   . History of hiatal hernia   . HOH (hard of hearing)   . Hypertension   . Incontinence of urine in female   . Orthopnea   . Pain    CHRONIC KNEE  . Pain    CHRONIC KNEE  . Paranoid disorder ([place])   . RLS (restless legs syndrome)   . Tremors of nervous system    HANDS    Patient Active Problem List   Diagnosis Date Noted  . Diastolic dysfunction 61/44/3154  . Sepsis ([place]) 12/03/2017  . Schizophrenia, undifferentiated ([place]) 11/24/2017  . COPD with acute exacerbation ([place]) 10/24/2017  . Nocturnal hypoxia 08/21/2016  . Restless leg syndrome 08/20/2016  . Impingement syndrome of shoulder region 07/29/2016  . Hyponatremia 03/21/2016  . Pneumonia 03/18/2016  . History of suicide attempt 03/16/2016  . Overdose 03/16/2016  . Pulmonary hypertension ([place]) 11/11/2015  . Anxiety 09/22/2015  . Depression 09/22/2015  . Osteoarthritis 09/10/2015  . Rosacea 07/03/2015  . Breast pain 11/07/2014  . Callus of foot 11/07/2014  . CN (constipation)  11/07/2014  . Dermatitis, eczematoid 11/07/2014  . Diverticulosis of colon 11/07/2014  . Dizziness 11/07/2014  . Can't get food down 11/07/2014  . Accumulation of fluid in tissues 11/07/2014  . Fatigue 11/07/2014  . FOM (frequency of micturition) 11/07/2014  . Tension type headache 11/07/2014  . LBP (low back pain) 11/07/2014  . Episodic mood disorder ([place]) 11/07/2014  . Extreme obesity 11/07/2014  . Muscle ache 11/07/2014  . Disturbance of skin sensation 11/07/2014  . Awareness of heartbeats 11/07/2014  . Jerking 11/07/2014  . Body tinea 11/07/2014  . Essential (primary) hypertension 10/10/2014  . Moderate COPD (chronic obstructive pulmonary disease) ([place]) 04/01/2014  . Hx of obesity 04/01/2014  . CAFL (chronic airflow limitation) ([place]) 01/25/2009  . Malaise and fatigue 11/26/2008  . Aphasia 09/26/2008  . 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 GERD (gastroesophageal reflux disease) 06/07/1998  . HLD (hyperlipidemia) 06/07/1998  . OP (osteoporosis) 06/07/1998  . H/O total hysterectomy 03/08/1987  . Schizophrenia, in remission ([place]) 06/07/1978    Past Surgical History:  Procedure Laterality Date  . ABDOMINAL HYSTERECTOMY  1988  . APPENDECTOMY  1988  . CARDIAC CATHETERIZATION    . CATARACT EXTRACTION W/PHACO Right 07/14/2016   Procedure: CATARACT EXTRACTION PHACO AND INTRAOCULAR LENS PLACEMENT ([place]) anterior vitrectomy;  Surgeon: Estill Cotta, MD;  Location: [facility] ORS;  Service: Ophthalmology;  Laterality: Right;  [place] 02:40 AP% 24.4 CDE 59.98 Fluid  pack # [id number]  . CATARACT EXTRACTION W/PHACO Left 02/16/2017   Procedure: CATARACT EXTRACTION PHACO AND INTRAOCULAR LENS PLACEMENT (IOC);  Surgeon: Estill Cotta, MD;  Location: [facility] ORS;  Service: Ophthalmology;  Laterality: Left;  Lot # [id number] H [place]: 01:11.8 AP%: 23.5 CDE: 32.09   . ESOPHAGOGASTRODUODENOSCOPY N/A 11/11/2014   Procedure: ESOPHAGOGASTRODUODENOSCOPY (EGD);  Surgeon: Josefine Class,  MD;  Location[facility] ENDOSCOPY;  Service: Endoscopy;  Laterality: N/A;  . TONSILLECTOMY AND ADENOIDECTOMY    . TRIGGER FINGER RELEASE  2004    Prior to Admission medications   Medication Sig Start Date End Date Taking? Authorizing Provider  albuterol (PROVENTIL) (2.5 MG/3ML) 0.083% nebulizer solution Take 3 mLs (2.5 mg total) by nebulization 2 (two) times daily as needed for wheezing or shortness of breath. 09/22/16   Birdie Sons, MD  aspirin EC 325 MG tablet Take 1 tablet (325 mg total) by mouth daily. Patient taking differently: Take 325 mg by mouth at bedtime.  09/23/15   Demetrios Loll, MD  benazepril (LOTENSIN) 5 MG tablet TAKE 1 TABLET(5 MG) BY MOUTH DAILY 11/20/17   Birdie Sons, MD  busPIRone (BUSPAR) 10 MG tablet Take 1 tablet (10 mg total) by mouth 3 (three) times daily. 12/09/17   Ursula Alert, MD  Calcium Carbonate-Vitamin D (CALCIUM 600+D) 600-400 MG-UNIT tablet Take 1 tablet by mouth 2 (two) times daily.     [provider]  Cranberry 250 MG CAPS Take 1 capsule by mouth daily.     [provider]  divalproex (DEPAKOTE) 250 MG DR tablet Take 1 tablet (250 mg total) by mouth 2 (two) times daily. 12/09/17   Ursula Alert, MD  fluticasone (FLONASE) 50 MCG/ACT nasal spray Place 1 spray into both nostrils daily.    [provider]  Fluticasone-Salmeterol (WIXELA INHUB) 250-50 MCG/DOSE AEPB Inhale 1 puff into the lungs 2 (two) times daily.    [provider]  Fluticasone-Umeclidin-Vilant (TRELEGY ELLIPTA) 100-62.5-25 MCG/INH AEPB Inhale 1 puff into the lungs daily. 11/08/17   Birdie Sons, MD  furosemide (LASIX) 20 MG tablet Take 1 tablet (20 mg total) by mouth daily. 12/06/17 01/05/18  Hillary Bow, MD  gabapentin (NEURONTIN) 100 MG capsule TAKE 1 CAPSULE(100 MG) BY MOUTH TWICE DAILY 04/26/17   Birdie Sons, MD  gabapentin (NEURONTIN) 300 MG capsule TAKE 2 CAPSULES(600 MG) BY MOUTH AT BEDTIME 04/26/17   Birdie Sons, MD  hydrOXYzine  (ATARAX/VISTARIL) 25 MG tablet TAKE 1 TABLET(25 MG) BY MOUTH EVERY 8 HOURS AS NEEDED FOR ANXIETY 05/08/17   Birdie Sons, MD  levofloxacin (LEVAQUIN) 750 MG tablet Take 1 tablet (750 mg total) by mouth daily. 12/06/17   Hillary Bow, MD  loratadine (CLARITIN) 10 MG tablet Take 10 mg by mouth daily.    [provider]  magnesium oxide (MAG-OX) 400 MG tablet Take 400 mg by mouth 2 (two) times daily.    [provider]  Melatonin 10 MG CAPS Take 10 mg by mouth at bedtime as needed.    [provider]  meloxicam (MOBIC) 15 MG tablet TAKE 1 TABLET BY MOUTH EVERY DAY WITH A MEALS    [provider]  Misc Natural Products (OSTEO BI-FLEX ADV DOUBLE ST PO) Take 1 tablet by mouth 2 (two) times daily.     [provider]  montelukast (SINGULAIR) 10 MG tablet TAKE 1 TABLET BY MOUTH EVERY DAY 04/22/16   Birdie Sons, MD  Multiple Vitamin (MULTIVITAMIN WITH MINERALS) TABS tablet Take 1 tablet by  mouth daily.    [provider]  Omega-3 Fatty Acids (FISH OIL) 1000 MG CAPS Take 1,000 mg by mouth daily.     [provider]  oxybutynin (DITROPAN-XL) 10 MG 24 hr tablet TAKE 1 TABLET BY MOUTH AT BEDTIME 08/21/17   Birdie Sons, MD  predniSONE (DELTASONE) 10 MG tablet  12/06/17   [provider]  predniSONE (STERAPRED UNI-PAK 21 TAB) 10 MG (21) TBPK tablet 6 tabs day 1 and taper 10 mg a day - 6 days 12/06/17   Hillary Bow, MD  risperiDONE (RISPERDAL) 1 MG tablet Take 1 tablet (1 mg total) by mouth at bedtime. 12/09/17   Ursula Alert, MD  theophylline (THEO-24) 200 MG 24 hr capsule Take 1 capsule (200 mg total) by mouth daily. 12/07/17   Birdie Sons, MD  theophylline (UNIPHYL) 400 MG 24 hr tablet  12/07/17   [provider]  tiZANidine (ZANAFLEX) 4 MG tablet Take 1 tablet (4 mg total) by mouth every 8 (eight) hours as needed for muscle spasms. 06/17/17   Birdie Sons, MD  traMADol Veatrice Bourbon) 50 MG tablet TAKE 2 TABLETS BY MOUTH  TWICE DAILY 12/04/17   Birdie Sons, MD  traZODone (DESYREL) 50 MG tablet Take 0.5-1 tablets (25-50 mg total) by mouth at bedtime as needed for sleep. 11/08/17   Ursula Alert, MD    Allergies Arthrotec [diclofenac-misoprostol]; Codeine; Diclofenac; Hydrochlorothiazide; Penicillins; Sulfa antibiotics; Tiotropium bromide monohydrate; Tylenol [acetaminophen]; Morphine; and Pantoprazole  Family History  Problem Relation Age of Onset  . Breast cancer Sister   . Stroke Father   . Emphysema Father   . Anxiety disorder Father   . Depression Father   . Anxiety disorder Brother     Social History Social History   Tobacco Use  . Smoking status: Former Smoker    Years: 30.00  . Smokeless tobacco: Never Used  . Tobacco comment: quit in 1989  Substance Use Topics  . Alcohol use: No    Alcohol/week: 0.0 oz  . Drug use: No    Review of Systems  Constitutional: No fever.  Positive for generalized weakness. Eyes: No redness. ENT: No sore throat. Cardiovascular: Positive for chest pain. Respiratory: Positive for shortness of breath. Gastrointestinal: No vomiting. Genitourinary: Negative for dysuria.  Musculoskeletal: Negative for back pain. Skin: Negative for rash. Neurological: Negative for headache.   ____________________________________________   PHYSICAL EXAM:  VITAL SIGNS: ED Triage Vitals  Enc Vitals Group     BP      Pulse      Resp      Temp      Temp src      SpO2      Weight      Height      Head Circumference      Peak Flow      Pain Score      Pain Loc      Pain Edu?      Excl. in [place]?     Constitutional: Alert and oriented.  Uncomfortable appearing but in no acute distress. Eyes: Conjunctivae are normal.  EOMI. Head: Atraumatic. Nose: No congestion/rhinnorhea. Mouth/Throat: Mucous membranes are dry.   Neck: Normal range of motion.  Cardiovascular: Normal rate, regular rhythm. Grossly normal heart sounds.  Good peripheral circulation.  Right  lower anterior chest wall tenderness, reproducing the pain. Respiratory: Normal respiratory effort.  No retractions. Lungs CTAB. Gastrointestinal: Soft and nontender. No distention.  Genitourinary: No flank tenderness. Musculoskeletal: No lower extremity  edema.  Extremities warm and well perfused.  Neurologic:  Normal speech and language. No gross focal neurologic deficits are appreciated.  Skin:  Skin is warm and dry. No rash noted. Psychiatric: Mood and affect are normal. Speech and behavior are normal.  ____________________________________________   LABS (all labs ordered are listed, but only abnormal results are displayed)  Labs Reviewed  COMPREHENSIVE METABOLIC PANEL - Abnormal; Notable for the following components:      Result Value   Potassium 5.2 (*)    Calcium 8.7 (*)    Total Protein 5.1 (*)    Albumin 2.9 (*)    All other components within normal limits  URINALYSIS, COMPLETE (UACMP) WITH MICROSCOPIC - Abnormal; Notable for the following components:   Color, Urine COLORLESS (*)    APPearance CLEAR (*)    Specific Gravity, Urine 1.003 (*)    All other components within normal limits  CBC WITH DIFFERENTIAL/PLATELET - Abnormal; Notable for the following components:   RDW 14.7 (*)    Lymphs Abs 4.2 (*)    Monocytes Absolute 1.1 (*)    All other components within normal limits  LIPASE, BLOOD  TROPONIN I  LACTIC ACID, PLASMA  LACTIC ACID, PLASMA  CBC WITH DIFFERENTIAL/PLATELET   ____________________________________________  EKG  ED ECG REPORT I, Arta Silence, the attending physician, personally viewed and interpreted this ECG.  Date: 12/11/2017 EKG Time: 1410 Rate: 99 Rhythm: Atrial fibrillation QRS Axis: normal Intervals: normal ST/T Wave abnormalities: normal Narrative Interpretation: no evidence of acute ischemia  ____________________________________________  RADIOLOGY  CXR: Cardiomegaly, vascular  congestion  ____________________________________________   PROCEDURES  Procedure(s) performed: No  Procedures  Critical Care performed: No ____________________________________________   INITIAL IMPRESSION / ASSESSMENT AND PLAN / ED COURSE  Pertinent labs & imaging results that were available during my care of the patient were reviewed by me and considered in my medical decision making (see chart for details).  76 year old female with PMH as noted above presents with right lower anterior chest pain, generalized weakness, shortness of breath, and nausea over the last several days.  I reviewed the past medical records in Epic; the patient was seen in the ED on 6/29 for altered mental status and weakness and was admitted for sepsis with likely multifocal pneumonia.  On exam today, the patient is uncomfortable appearing, but with relatively stable vital signs.  Her mucous membranes are very dry.  She does have some mild tenderness to the anterior lower chest on the right, but the abdomen itself is nontender.  Differential includes recurrent pneumonia, other infectious cause such as UTI, acute bronchitis, COPD exacerbation, ACS, dehydration or other metabolic etiology, gastritis, or less likely hepatobiliary cause.  Will obtain chest x-ray, obtain lab work-up, give symptomatic treatment and fluids, and reassess.  ----------------------------------------- 6:00 PM on 12/11/2017 -----------------------------------------  The patient's chest x-ray shows some vascular congestion, but no other significant acute findings.  She has no respiratory distress and is not hypoxic even when taken off O2.  The lab work-up is unremarkable.  There is no evidence of sepsis, electrolyte disturbance except for very mild hyperkalemia, or UTI.  The patient states that she feels worse than when she left the hospital and that she is not able to take care of herself at home in her current state.  This time there  is no indication to admit the patient since we are not treating any ongoing acute medical issue, however it does not appear safe to send patient home at this time.  Patient  would like to go to rehab or to a facility for somewhat more advanced care if possible.  We will order a social work and physical therapy consult to evaluate the patient and determine if she qualifies for any additional care.  The patient understands we will have to wait that she in the ED overnight since it is Sunday evening.    ____________________________________________   FINAL CLINICAL IMPRESSION(S) / ED DIAGNOSES  Final diagnoses:  Generalized weakness      NEW MEDICATIONS STARTED DURING THIS VISIT:  New Prescriptions   No medications on file     Note:  This document was prepared using Dragon voice recognition software and may include unintentional dictation errors.     Arta Silence, MD 12/11/17 [phone]

## 2017-12-11 NOTE — ED Notes (Signed)
Called Pharmacy to send up remainder of patient's daily medications.

## 2017-12-11 NOTE — ED Triage Notes (Signed)
Pt arrives from home via [facility]. PT complains of pain under R breast

## 2017-12-11 NOTE — ED Notes (Signed)
Pt given remote and warm blanket.

## 2017-12-11 NOTE — ED Notes (Signed)
Pt has been regularly rounded on during this shift, repositioned in bed q30 min per reqeust. Provided box lunch. Assisted pt with the phone. Purewick in place. Updated on boarder status and PT/social work consults pending in AM.

## 2017-12-12 DIAGNOSIS — R531 Weakness: Secondary | ICD-10-CM | POA: Diagnosis not present

## 2017-12-12 MED ORDER — HALOPERIDOL LACTATE 5 MG/ML IJ SOLN
5.0000 mg | Freq: Once | INTRAMUSCULAR | Status: AC
  Administered 2017-12-12: 5 mg via INTRAVENOUS
  Filled 2017-12-12: qty 1

## 2017-12-12 MED ORDER — FAMOTIDINE 20 MG PO TABS
20.0000 mg | ORAL_TABLET | Freq: Two times a day (BID) | ORAL | Status: DC
  Administered 2017-12-13 – 2017-12-14 (×4): 20 mg via ORAL
  Filled 2017-12-12 (×4): qty 1

## 2017-12-12 MED ORDER — ONDANSETRON HCL 4 MG/2ML IJ SOLN
INTRAMUSCULAR | Status: AC
  Administered 2017-12-12: 4 mg via INTRAVENOUS
  Filled 2017-12-12: qty 2

## 2017-12-12 MED ORDER — RANITIDINE HCL 150 MG/10ML PO SYRP: 150.0000 mg | ORAL_SOLUTION | Freq: Two times a day (BID) | ORAL | Status: DC

## 2017-12-12 MED ORDER — BISACODYL 5 MG PO TBEC
10.0000 mg | DELAYED_RELEASE_TABLET | Freq: Once | ORAL | Status: AC
  Administered 2017-12-12: 10 mg via ORAL
  Filled 2017-12-12: qty 2

## 2017-12-12 MED ORDER — ONDANSETRON HCL 4 MG/2ML IJ SOLN
4.0000 mg | Freq: Once | INTRAMUSCULAR | Status: AC
  Administered 2017-12-12: 4 mg via INTRAVENOUS

## 2017-12-12 MED ORDER — DOCUSATE SODIUM 100 MG PO CAPS
200.0000 mg | ORAL_CAPSULE | Freq: Once | ORAL | Status: AC
  Administered 2017-12-12: 200 mg via ORAL
  Filled 2017-12-12: qty 2

## 2017-12-12 NOTE — Clinical Social Work Note (Signed)
CSW received consult for "For rehab/placement." CSW met with patient and daughter-Catherine Dost (cell: [phone]) at bedside. Patient sleeping at this time. Daughter confirmed patient lives with daughter, but daughter works during the day and no one is home with patient. Daughter states patient has decreased endurance and feels short-term rehab at a [facility] (SNF) would be helpful. Physical therapy recommending SNF. Per daughter, patient has been to [facility] for rehab about 3-4 years ago and is agreeable to return there. Daughter also interested in [facility]. CSW explained referral process and need for insurance authorization through [facility] before transitioning to a SNF. Daughter expressed understanding. CSW updated EDP Dr. Corky Downs. CSW completed FL-2, included existing PASRR, and sent to [facility] and [facility]. Insurance auth pending. CSW continuing to follow for discharge needs.   Oretha Ellis, Latanya Presser, [place] Worker-Emergency Department [phone]

## 2017-12-12 NOTE — ED Notes (Signed)
PT c/o constipation EDP orders rx'd

## 2017-12-12 NOTE — ED Notes (Signed)
Pharmacy sent note to send up Dulcolax.

## 2017-12-12 NOTE — ED Provider Notes (Signed)
-----------------------------------------   6:12 PM on 12/12/2017 -----------------------------------------  Awaiting placement. No complaints. Signed out to dr. Mariea Clonts at the end of my shift.   Schuyler Amor, MD 12/12/17 [phone]

## 2017-12-12 NOTE — ED Notes (Signed)
Resting in bed with eyes closed, resp unlabored, SAT 97 room air, left undisturbed.

## 2017-12-12 NOTE — NC FL2 (Signed)
[place] LEVEL OF CARE SCREENING TOOL     IDENTIFICATION  Patient Name: Vicki Morales Birthdate: April 09, 1941 Sex: female Admission Date (Current Location): 12/11/2017  [place] and [place] Number:  Engineering geologist and Address:  [facility], [address] [place], [place] [address]      Provider Number: [id number]  Attending Physician Name and Address:  No att. providers found  Relative Name and Phone Number:  Daughter- Lucky Alverson [phone]    Current Level of Care: Hospital Recommended Level of Care: [place] Prior Approval Number:    Date Approved/Denied:   PASRR Number: [id number]  Discharge Plan: SNF    Current Diagnoses: Patient Active Problem List   Diagnosis Date Noted  . Diastolic dysfunction 56/21/3086  . Sepsis ([place]) 12/03/2017  . Schizophrenia, undifferentiated ([place]) 11/24/2017  . COPD with acute exacerbation ([place]) 10/24/2017  . Nocturnal hypoxia 08/21/2016  . Restless leg syndrome 08/20/2016  . Impingement syndrome of shoulder region 07/29/2016  . Hyponatremia 03/21/2016  . Pneumonia 03/18/2016  . History of suicide attempt 03/16/2016  . Overdose 03/16/2016  . Pulmonary hypertension ([place]) 11/11/2015  . Anxiety 09/22/2015  . Depression 09/22/2015  . Osteoarthritis 09/10/2015  . Rosacea 07/03/2015  . Breast pain 11/07/2014  . Callus of foot 11/07/2014  . CN (constipation) 11/07/2014  . Dermatitis, eczematoid 11/07/2014  . Diverticulosis of colon 11/07/2014  . Dizziness 11/07/2014  . Can't get food down 11/07/2014  . Accumulation of fluid in tissues 11/07/2014  . Fatigue 11/07/2014  . FOM (frequency of micturition) 11/07/2014  . Tension type headache 11/07/2014  . LBP (low back pain) 11/07/2014  . Episodic mood disorder ([place]) 11/07/2014  . Extreme obesity 11/07/2014  . Muscle ache 11/07/2014  . Disturbance of skin sensation 11/07/2014  . Awareness of heartbeats 11/07/2014  .  Jerking 11/07/2014  . Body tinea 11/07/2014  . Essential (primary) hypertension 10/10/2014  . Moderate COPD (chronic obstructive pulmonary disease) ([place]) 04/01/2014  . Hx of obesity 04/01/2014  . CAFL (chronic airflow limitation) ([place]) 01/25/2009  . Malaise and fatigue 11/26/2008  . Aphasia 09/26/2008  . 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 GERD (gastroesophageal reflux disease) 06/07/1998  . HLD (hyperlipidemia) 06/07/1998  . OP (osteoporosis) 06/07/1998  . H/O total hysterectomy 03/08/1987  . Schizophrenia, in remission ([place]) 06/07/1978    Orientation RESPIRATION BLADDER Height & Weight     Self, Time, Situation, Place  Normal Continent Weight: 270 lb (122.5 kg) Height:  5\' 4"  (162.6 cm)  BEHAVIORAL SYMPTOMS/MOOD NEUROLOGICAL BOWEL NUTRITION STATUS      Continent Diet(Normal Diet)  AMBULATORY STATUS COMMUNICATION OF NEEDS Skin   Extensive Assist Verbally Normal                       Personal Care Assistance Level of Assistance  Bathing, Feeding, Dressing Bathing Assistance: Limited assistance Feeding assistance: Independent Dressing Assistance: Limited assistance     Functional Limitations Info  Sight, Hearing, Speech Sight Info: Adequate Hearing Info: Adequate Speech Info: Adequate    SPECIAL CARE FACTORS FREQUENCY  PT (By licensed PT)     PT Frequency: 5x/Week              Contractures Contractures Info: Not present    Additional Factors Info  Code Status, Allergies, Psychotropic Code Status Info: Full Allergies Info: Arthrotec Diclofenac-misoprostol, Codeine, Diclofenac, Hydrochlorothiazide, Penicillins, Sulfa Antibiotics, Tiotropium Bromide Monohydrate, Tylenol Acetaminophen, Morphine, Pantoprazole Psychotropic Info: See Med List  Current Medications (12/12/2017):  This is the current hospital active medication list Current Facility-Administered Medications  Medication Dose Route Frequency Provider Last Rate Last  Dose  . benazepril (LOTENSIN) tablet 5 mg  5 mg Oral Daily Carrie Mew, MD   5 mg at 12/12/17 1014  . busPIRone (BUSPAR) tablet 10 mg  10 mg Oral TID Carrie Mew, MD   10 mg at 12/12/17 1014  . divalproex (DEPAKOTE) DR tablet 250 mg  250 mg Oral BID Carrie Mew, MD   250 mg at 12/12/17 1014  . furosemide (LASIX) tablet 20 mg  20 mg Oral Daily Carrie Mew, MD   20 mg at 12/12/17 1016  . gabapentin (NEURONTIN) capsule 100 mg  100 mg Oral BID Carrie Mew, MD   100 mg at 12/12/17 1016  . gabapentin (NEURONTIN) capsule 600 mg  600 mg Oral QHS Carrie Mew, MD   600 mg at 12/12/17 0005  . Melatonin TABS 10 mg  10 mg Oral QHS PRN Carrie Mew, MD      . meloxicam [facility]) tablet 15 mg  15 mg Oral Daily Carrie Mew, MD   15 mg at 12/12/17 1016  . oxybutynin (DITROPAN-XL) 24 hr tablet 10 mg  10 mg Oral QHS Carrie Mew, MD   10 mg at 12/11/17 2353  . risperiDONE (RISPERDAL) tablet 1 mg  1 mg Oral QHS Carrie Mew, MD   1 mg at 12/11/17 2349  . theophylline (THEO-24) 24 hr capsule 200 mg  200 mg Oral Daily Carrie Mew, MD   200 mg at 12/12/17 1109  . traZODone (DESYREL) tablet 25-50 mg  25-50 mg Oral QHS PRN Carrie Mew, MD       Current Outpatient Medications  Medication Sig Dispense Refill  . albuterol (PROVENTIL) (2.5 MG/3ML) 0.083% nebulizer solution Take 3 mLs (2.5 mg total) by nebulization 2 (two) times daily as needed for wheezing or shortness of breath. 75 mL 5  . aspirin EC 325 MG tablet Take 1 tablet (325 mg total) by mouth daily. 30 tablet 2  . benazepril (LOTENSIN) 5 MG tablet TAKE 1 TABLET(5 MG) BY MOUTH DAILY 30 tablet 11  . busPIRone (BUSPAR) 10 MG tablet Take 1 tablet (10 mg total) by mouth 3 (three) times daily. 270 tablet 0  . Calcium Carbonate-Vitamin D (CALCIUM 600+D) 600-400 MG-UNIT tablet Take 1 tablet by mouth 3 (three) times daily.     . Cranberry 250 MG CAPS Take 1 capsule by mouth daily.     . divalproex  (DEPAKOTE) 250 MG DR tablet Take 1 tablet (250 mg total) by mouth 2 (two) times daily. 180 tablet 1  . fluticasone (FLONASE) 50 MCG/ACT nasal spray Place 1 spray into both nostrils daily.    . Fluticasone-Salmeterol (WIXELA INHUB) 250-50 MCG/DOSE AEPB Inhale 1 puff into the lungs 2 (two) times daily.    . Fluticasone-Umeclidin-Vilant (TRELEGY ELLIPTA) 100-62.5-25 MCG/INH AEPB Inhale 1 puff into the lungs daily. 1 each 2  . furosemide (LASIX) 20 MG tablet Take 1 tablet (20 mg total) by mouth daily. 30 tablet 0  . gabapentin (NEURONTIN) 100 MG capsule TAKE 1 CAPSULE(100 MG) BY MOUTH TWICE DAILY 60 capsule 11  . gabapentin (NEURONTIN) 300 MG capsule TAKE 2 CAPSULES(600 MG) BY MOUTH AT BEDTIME 60 capsule 11  . hydrOXYzine (ATARAX/VISTARIL) 25 MG tablet TAKE 1 TABLET(25 MG) BY MOUTH EVERY 8 HOURS AS NEEDED FOR ANXIETY 60 tablet 5  . levofloxacin (LEVAQUIN) 750 MG tablet Take 1 tablet (750 mg total) by mouth daily.  5 tablet 0  . loratadine (CLARITIN) 10 MG tablet Take 10 mg by mouth daily.    . magnesium oxide (MAG-OX) 400 MG tablet Take 400 mg by mouth 2 (two) times daily.    . Melatonin 10 MG CAPS Take 10 mg by mouth at bedtime as needed.    . meloxicam (MOBIC) 15 MG tablet TAKE 1 TABLET BY MOUTH EVERY DAY WITH A MEALS    . Misc Natural Products (OSTEO BI-FLEX ADV DOUBLE ST PO) Take 1 tablet by mouth 2 (two) times daily.     . montelukast (SINGULAIR) 10 MG tablet TAKE 1 TABLET BY MOUTH EVERY DAY 30 tablet 12  . Multiple Vitamin (MULTIVITAMIN WITH MINERALS) TABS tablet Take 1 tablet by mouth daily.    . Omega-3 Fatty Acids (FISH OIL) 1000 MG CAPS Take 1,000 mg by mouth daily.     [name] Kitchen oxybutynin (DITROPAN-XL) 10 MG 24 hr tablet TAKE 1 TABLET BY MOUTH AT BEDTIME 30 tablet 5  . predniSONE (STERAPRED UNI-PAK 21 TAB) 10 MG (21) TBPK tablet 6 tabs day 1 and taper 10 mg a day - 6 days 21 tablet 0  . risperiDONE (RISPERDAL) 1 MG tablet Take 1 tablet (1 mg total) by mouth at bedtime. 90 tablet 1  . theophylline  (THEO-24) 200 MG 24 hr capsule Take 1 capsule (200 mg total) by mouth daily. 30 capsule 5  . tiZANidine (ZANAFLEX) 4 MG tablet Take 1 tablet (4 mg total) by mouth every 8 (eight) hours as needed for muscle spasms. 30 tablet 5  . traMADol (ULTRAM) 50 MG tablet TAKE 2 TABLETS BY MOUTH TWICE DAILY 60 tablet 0  . traZODone (DESYREL) 50 MG tablet Take 0.5-1 tablets (25-50 mg total) by mouth at bedtime as needed for sleep. 90 tablet 1     Discharge Medications: Please see discharge summary for a list of discharge medications.  Relevant Imaging Results:  Relevant Lab Results:   Additional Information SSN: [id number]  Truitt Merle, LCSW

## 2017-12-12 NOTE — ED Notes (Signed)
Resting, states ready to take nap.

## 2017-12-12 NOTE — Evaluation (Signed)
Physical Therapy Evaluation Patient Details Name: Vicki Morales MRN: [id number] DOB: [birth year] Today's Date: 12/12/2017   History of Present Illness  Pt is a 77 y/o F who presented with pain under R breast as well as generalized weakness and SOB.  Chest x-ray shows some vascular congestion but no other significant acute findings.  Pt's PMH includes COPD, chronic Bil knee pain, paranoid disorder, schizophrenia, LBP.    Clinical Impression  Pt admitted with above diagnosis. Pt currently with functional limitations due to the deficits listed below (see PT Problem List). Vicki Morales presents with decreased endurance and strength compared to PT evaluation completed ~1.5 wks prior.  She requires mod assist for bed mobility and fatigues quickly after ambulating just 30 ft with RW.  Pt is alone during the day when her daughter is at work and since recent d/c from the hospital she has had one fall without injury and is having difficulty with ADLs.  Given pt's current mobility status, recommending SNF at d/c.  Pt will benefit from skilled PT to increase their independence and safety with mobility to allow discharge to the venue listed below.       Follow Up Recommendations SNF    Equipment Recommendations  None recommended by PT    Recommendations for Other Services       Precautions / Restrictions Precautions Precautions: Fall;Other (comment) Precaution Comments: monitor O2 and HR Restrictions Weight Bearing Restrictions: No      Mobility  Bed Mobility Overal bed mobility: Needs Assistance Bed Mobility: Rolling;Supine to Sit;Sit to Supine Rolling: Min guard   Supine to sit: Mod assist;HOB elevated Sit to supine: Min assist   General bed mobility comments: Pt pulls heavily on bed rail to roll onto her side for pericare.  Pt requires mod assist to elevate trunk for supine>sit and min assist to return to supine.  Pt reports feeling lightheaded sitting EOB (BP readings below in general  comments).    Transfers Overall transfer level: Needs assistance Equipment used: Rolling walker (2 wheeled) Transfers: Sit to/from Stand Sit to Stand: Min assist;From elevated surface         General transfer comment: Pt requires assist to remain stead with sit<>stand from raised bed.  Pt reports, "my knees are going to give way".   Ambulation/Gait Ambulation/Gait assistance: Min guard Gait Distance (Feet): 30 Feet Assistive device: Rolling walker (2 wheeled) Gait Pattern/deviations: Step-through pattern;Antalgic;Wide base of support;Trunk flexed Gait velocity: decreased   General Gait Details: Pt with flexed posture which does not improve with verbal cues.  Pt reports fatigue, especially in LEs.  Pt demonstrates SOB with SpO2 remaining at or above 88% on RA.  Pt fatigues quickly and reports feeling "wobbly".   Stairs            Wheelchair Mobility    Modified Rankin (Stroke Patients Only)       Balance Overall balance assessment: Needs assistance;History of Falls Sitting-balance support: No upper extremity supported;Feet supported Sitting balance-Leahy Scale: Fair     Standing balance support: Bilateral upper extremity supported;During functional activity Standing balance-Leahy Scale: Poor Standing balance comment: Pt relies on at least 1UE support for static activity                             Pertinent Vitals/Pain Pain Assessment: Faces Faces Pain Scale: Hurts little more Pain Location: R chest with turning in bed Pain Descriptors / Indicators: Discomfort Pain Intervention(s): Limited activity within  patient's tolerance;Monitored during session    Home Living Family/patient expects to be discharged to:: Private residence Living Arrangements: Children Available Help at Discharge: Family;Available PRN/intermittently(daughter works during the day) Type of Home: House Home Access: [place]: One [place]:  Environmental consultant - 2 wheels;Cane - single point;Shower seat;Bedside commode;Grab bars - toilet      Prior Function Level of Independence: Independent with assistive device(s)         Comments: Pt ambulates household distances with RW.  Pt had a fall without injury a few days PTA.  Pt independent with bathing, dressing.  Daughter assist with cooking, cleaning, driving.  Daughter reported that since recent d/c from the hospital the pt has had difficulty getting up from the commode.  She reports that even ambulating to the kitchen to get a drink the pt has difficulty.      Hand Dominance        Extremity/Trunk Assessment   Upper Extremity Assessment Upper Extremity Assessment: Generalized weakness    Lower Extremity Assessment Lower Extremity Assessment: Generalized weakness       Communication   Communication: No difficulties  Cognition Arousal/Alertness: Awake/alert Behavior During Therapy: WFL for tasks assessed/performed Overall Cognitive Status: Impaired/Different from baseline Area of Impairment: Orientation;Memory;Safety/judgement                 Orientation Level: Disoriented to;Situation;Time;Place   Memory: Decreased short-term memory   Safety/Judgement: Decreased awareness of safety;Decreased awareness of deficits            General Comments General comments (skin integrity, edema, etc.): BP in supine 115/85 and sitting EOB 145/75.  SpO2 remains at or above 88% on RA throughout session with SOB noted with activity.     Exercises     Assessment/Plan    PT Assessment Patient needs continued PT services  PT Problem List Decreased strength;Decreased activity tolerance;Decreased balance;Decreased mobility;Decreased cognition;Decreased knowledge of use of DME;Decreased safety awareness;Cardiopulmonary status limiting activity;Pain;Obesity       PT Treatment Interventions DME instruction;Gait training;Functional mobility training;Therapeutic  activities;Therapeutic exercise;Balance training;Neuromuscular re-education;Cognitive remediation;Patient/family education;Wheelchair mobility training    PT Goals (Current goals can be found in the Care Plan section)  Acute Rehab PT Goals Patient Stated Goal: to get stronger PT Goal Formulation: With patient Time For Goal Achievement: 12/26/17 Potential to Achieve Goals: Good    Frequency Min 2X/week   Barriers to discharge Decreased caregiver support Daughter works during the day    Co-evaluation               AM-PAC PT "6 Clicks" Daily Activity  Outcome Measure Difficulty turning over in bed (including adjusting bedclothes, sheets and blankets)?: Unable Difficulty moving from lying on back to sitting on the side of the bed? : Unable Difficulty sitting down on and standing up from a chair with arms (e.g., wheelchair, bedside commode, etc,.)?: Unable Help needed moving to and from a bed to chair (including a wheelchair)?: A Little Help needed walking in hospital room?: A Little Help needed climbing 3-5 steps with a railing? : Total 6 Click Score: 10    End of Session Equipment Utilized During Treatment: Gait belt Activity Tolerance: Patient limited by fatigue Patient left: in bed;with call bell/phone within reach;with family/visitor present Nurse Communication: Mobility status;Other (comment)(BP readings, pt feeling lightheaded) PT Visit Diagnosis: Muscle weakness (generalized) (M62.81);History of falling (Z91.81);Unsteadiness on feet (R26.81);Difficulty in walking, not elsewhere classified (R26.2)    Time: 9242-6834 PT Time Calculation (min) (ACUTE  ONLY): 42 min   Charges:   PT Evaluation $PT Eval Low Complexity: 1 Low PT Treatments $Therapeutic Activity: 23-37 mins   PT G Codes:        Collie Siad PT, DPT 12/12/2017, 9:53 AM

## 2017-12-12 NOTE — ED Notes (Signed)
Daughter states she has consulted with Education officer, museum. Lunch served to patient, taking by self without difficulty. Void per bedpan.

## 2017-12-12 NOTE — Clinical Social Work Note (Signed)
CSW still awaiting bed offers and insurance authorization for patient to go to SNF.  Jones Broom. [place], MSW, [place]

## 2017-12-13 DIAGNOSIS — R531 Weakness: Secondary | ICD-10-CM | POA: Diagnosis not present

## 2017-12-13 MED ORDER — NYSTATIN 100000 UNIT/GM EX POWD
Freq: Three times a day (TID) | CUTANEOUS | Status: DC
  Administered 2017-12-13 – 2017-12-14 (×4): via TOPICAL
  Filled 2017-12-13 (×2): qty 15

## 2017-12-13 MED ORDER — ONDANSETRON 4 MG PO TBDP
ORAL_TABLET | ORAL | Status: AC
  Administered 2017-12-13: 4 mg via ORAL
  Filled 2017-12-13: qty 1

## 2017-12-13 MED ORDER — ONDANSETRON 4 MG PO TBDP
4.0000 mg | ORAL_TABLET | Freq: Once | ORAL | Status: AC
  Administered 2017-12-13: 4 mg via ORAL

## 2017-12-13 NOTE — ED Notes (Signed)
Pt currently eating lunch. Would like to wait until after lunch for nystatin powder application.

## 2017-12-13 NOTE — Progress Notes (Signed)
Pt noted to have tachycardia via pulse ox monitor (120-140) and SaO2 dropped to 80-86% on RA.  Placed on continuous ECG monitoring and 1L [place].  Dr Dahlia Client updated.

## 2017-12-13 NOTE — Progress Notes (Signed)
Pt moved onto a regular hospital bed from stretcher.  Purewick external urine collection device placed.  Pericare performed and gown changed.  Yeast infection present on lower abd.  PA notified and orders received.

## 2017-12-13 NOTE — Clinical Social Work Note (Signed)
CSW has sent patient's clinical information, patient still does not have any bed offers, CSW awaiting responses from SNFs.  Patient will need insurance authorization, before she is able to discharge to a SNF.  Patient was requesting either [facility] or [facility], both have denied patient, CSW sent clinicals to other SNFs.  Jones Broom. [place], MSW, [place]  12/13/2017 10:22 AM

## 2017-12-13 NOTE — Progress Notes (Signed)
Physical Therapy Treatment Patient Details Name: Vicki Morales MRN: [id number] DOB: 01-19-1941 Today's Date: 12/13/2017    History of Present Illness 77 y/o F who presented with pain under R breast as well as generalized weakness and SOB.  Chest x-ray shows some vascular congestion but no other significant acute findings.  Pt's PMH includes COPD, chronic Bil knee pain, paranoid disorder, schizophrenia, LBP.    PT Comments    Pt was eager to get out of bed and try to do some activity.  She did better with mobility and was able to walk <60 ft today but again fatigued quickly and needed to sit and rest after the effort. Her HR was ~100 on arrival and increased to ~130 with modest bout of ambulation, O2 stayed in the mid/upper 90s on room air but pt was very dyspneic and though she was motivated to do another bout of ambulation or some exercises she was too tired to do so after the effort of bed mobility and in-room ambulation.  Pt still appropriate for STR to get back to a more independent and functional status at home.   Follow Up Recommendations  SNF     Equipment Recommendations  None recommended by PT    Recommendations for Other Services       Precautions / Restrictions Precautions Precautions: Fall Precaution Comments: monitor O2 and HR Restrictions Weight Bearing Restrictions: No    Mobility  Bed Mobility Overal bed mobility: Needs Assistance Bed Mobility: Rolling;Supine to Sit;Sit to Supine Rolling: Min guard   Supine to sit: Min guard Sit to supine: Min guard   General bed mobility comments: Pt labored with getting out of and back into bed, but was able to do so w/o direct assist.  She needed encouragement, cuing and CGA, but did manage to transition w/o direct assist  Transfers Overall transfer level: Needs assistance Equipment used: Rolling walker (2 wheeled) Transfers: Sit to/from Stand Sit to Stand: From elevated surface;Min guard         General transfer  comment: Pt was able to rise from bed with only light cues for hand placement and close supervision to insure safety.  Later able to rise from low commode in room with the use of grab bar and again only CGA to keep weight forward.  Ambulation/Gait Ambulation/Gait assistance: Min guard Gait Distance (Feet): 65 Feet Assistive device: Rolling walker (2 wheeled)       General Gait Details: Pt was able to ambulate with good overall safety, though she did become fatigued relatively quickly with HR increasing to ~130 and DOE.  Her O2 sats stayed in the mid/upper 90s but she definitely was fatigued and short of breath.     Stairs             Wheelchair Mobility    Modified Rankin (Stroke Patients Only)       Balance                                            Cognition Arousal/Alertness: Awake/alert Behavior During Therapy: WFL for tasks assessed/performed Overall Cognitive Status: Impaired/Different from baseline Area of Impairment: Orientation;Memory;Safety/judgement                 Orientation Level: Disoriented to;Situation;Time;Place                    Exercises  General Comments        Pertinent Vitals/Pain Pain Location: unrated, non-descript, fleeting chest pain after ambulation    Home Living                      Prior Function            PT Goals (current goals can now be found in the care plan section) Progress towards PT goals: Progressing toward goals    Frequency    Min 2X/week      PT Plan Current plan remains appropriate    Co-evaluation              AM-PAC PT "6 Clicks" Daily Activity  Outcome Measure  Difficulty turning over in bed (including adjusting bedclothes, sheets and blankets)?: A Little Difficulty moving from lying on back to sitting on the side of the bed? : A Lot Difficulty sitting down on and standing up from a chair with arms (e.g., wheelchair, bedside commode, etc,.)?:  A Little Help needed moving to and from a bed to chair (including a wheelchair)?: A Little Help needed walking in hospital room?: A Little Help needed climbing 3-5 steps with a railing? : A Lot 6 Click Score: 16    End of Session Equipment Utilized During Treatment: Gait belt Activity Tolerance: Patient limited by fatigue Patient left: in bed;with call bell/phone within reach;with family/visitor present Nurse Communication: Mobility status PT Visit Diagnosis: Muscle weakness (generalized) (M62.81);History of falling (Z91.81);Unsteadiness on feet (R26.81);Difficulty in walking, not elsewhere classified (R26.2)     Time: 6812-7517 PT Time Calculation (min) (ACUTE ONLY): 33 min  Charges:  $Gait Training: 8-22 mins $Therapeutic Activity: 8-22 mins                    G Codes:        Kreg [place], DPT 12/13/2017, 5:26 PM

## 2017-12-13 NOTE — ED Notes (Signed)
Patient given crackers and peanut butter and ginger ale. Repositioned in bed.

## 2017-12-13 NOTE — Clinical Social Work Note (Addendum)
CSW spoke to patient in regards to SNFplacement, she would like to go to [facility] for short term rehab.  CSW contacted [facility] and they will start insurance authorization.  CSW awaiting to hear from SNF in regards to authorization.  CSW continuing to follow patient's progress throughout discharge planning.  [place] started insurance authorization, and CSW provided updated PT notes.  Jones Broom. [place], MSW, [place]  12/13/2017 1:51 PM

## 2017-12-14 DIAGNOSIS — Z87891 Personal history of nicotine dependence: Secondary | ICD-10-CM | POA: Diagnosis not present

## 2017-12-14 DIAGNOSIS — H6120 Impacted cerumen, unspecified ear: Secondary | ICD-10-CM | POA: Diagnosis not present

## 2017-12-14 DIAGNOSIS — R6889 Other general symptoms and signs: Secondary | ICD-10-CM | POA: Diagnosis not present

## 2017-12-14 DIAGNOSIS — R531 Weakness: Secondary | ICD-10-CM | POA: Diagnosis not present

## 2017-12-14 DIAGNOSIS — I2729 Other secondary pulmonary hypertension: Secondary | ICD-10-CM | POA: Diagnosis not present

## 2017-12-14 DIAGNOSIS — M1990 Unspecified osteoarthritis, unspecified site: Secondary | ICD-10-CM | POA: Diagnosis not present

## 2017-12-14 DIAGNOSIS — Z9181 History of falling: Secondary | ICD-10-CM | POA: Diagnosis not present

## 2017-12-14 DIAGNOSIS — I10 Essential (primary) hypertension: Secondary | ICD-10-CM | POA: Diagnosis not present

## 2017-12-14 DIAGNOSIS — Z7982 Long term (current) use of aspirin: Secondary | ICD-10-CM | POA: Diagnosis not present

## 2017-12-14 DIAGNOSIS — F209 Schizophrenia, unspecified: Secondary | ICD-10-CM | POA: Diagnosis not present

## 2017-12-14 DIAGNOSIS — F411 Generalized anxiety disorder: Secondary | ICD-10-CM | POA: Diagnosis not present

## 2017-12-14 DIAGNOSIS — R9431 Abnormal electrocardiogram [ECG] [EKG]: Secondary | ICD-10-CM | POA: Diagnosis not present

## 2017-12-14 DIAGNOSIS — J449 Chronic obstructive pulmonary disease, unspecified: Secondary | ICD-10-CM | POA: Diagnosis not present

## 2017-12-14 DIAGNOSIS — Z79899 Other long term (current) drug therapy: Secondary | ICD-10-CM | POA: Diagnosis not present

## 2017-12-14 DIAGNOSIS — F203 Undifferentiated schizophrenia: Secondary | ICD-10-CM | POA: Diagnosis not present

## 2017-12-14 DIAGNOSIS — M6281 Muscle weakness (generalized): Secondary | ICD-10-CM | POA: Diagnosis not present

## 2017-12-14 DIAGNOSIS — E7849 Other hyperlipidemia: Secondary | ICD-10-CM | POA: Diagnosis not present

## 2017-12-14 DIAGNOSIS — R2681 Unsteadiness on feet: Secondary | ICD-10-CM | POA: Diagnosis not present

## 2017-12-14 DIAGNOSIS — M810 Age-related osteoporosis without current pathological fracture: Secondary | ICD-10-CM | POA: Diagnosis not present

## 2017-12-14 MED ORDER — TRAMADOL HCL 50 MG PO TABS
ORAL_TABLET | ORAL | Status: AC
  Filled 2017-12-14: qty 1

## 2017-12-14 MED ORDER — TRAMADOL HCL 50 MG PO TABS
50.0000 mg | ORAL_TABLET | Freq: Once | ORAL | Status: AC
  Administered 2017-12-14: 50 mg via ORAL

## 2017-12-14 NOTE — Clinical Social Work Note (Addendum)
Pt is ready for discharge today and will go to [facility]). Pt and daughter-Vicki Morales (at bedside) are aware and agreeable to discharge plan. CSW sent clinicals to [facility] and communicated with Otila Kluver (admissions) for room and report. Insurance auth received by [employer] CSW updated EDP Dr. Cinda Quest and [place]. EDRN [place] to call report to [phone] for room 59A. ED Secretary Lattie Haw to arrange ambulance transport. CSW is signing off as no further needs identified.  Oretha Ellis, Latanya Presser, [place] Worker-Emergency Department [phone]

## 2017-12-14 NOTE — ED Notes (Signed)
[facility]  CALLED  FOR  TRANSPORT 

## 2017-12-14 NOTE — ED Notes (Signed)
[facility] arrived to transport Pt

## 2017-12-14 NOTE — ED Notes (Signed)
Report called to [facility], given to [facility] the department Freight forwarder. Facility aware pt come by ems.

## 2017-12-14 NOTE — ED Notes (Signed)
Patient up to restroom with minimal assistance (only needed assistance with holding walker steady for patient while standing). Patient stated she had not slept any tonight and felt she needed sleep medication. Discussed with ER MD. Patient given PRN Melatonin as ordered. Patient continues to await placement for rehab.

## 2017-12-14 NOTE — Discharge Instructions (Addendum)
Please continue all your medications.  Follow-up with your doctor within the week.  Please return for any further problems.

## 2017-12-14 NOTE — ED Notes (Signed)
Pt up to toilet in room with minimal assistance with walker. Linen on bed changed, gown changed, patient washed up independently. Pt ate about 50% of breakfast independently, was set up by this RN. Nystatin applied under bilateral breasts and in bilateral groin areas. Pt up in bed, playing sudoku.

## 2017-12-15 NOTE — Discharge Summary (Signed)
[place] [facility] [place] NAME: Vicki Morales    MR#:  [id number]  DATE OF BIRTH:  11-27-1940  DATE OF ADMISSION:  12/03/2017 ADMITTING PHYSICIAN: Gladstone Lighter, MD  DATE OF DISCHARGE: 12/06/2017  3:42 PM  PRIMARY CARE PHYSICIAN: Vicki Sons, MD   ADMISSION DIAGNOSIS:  Disorientation [R41.0] Acute respiratory failure with hypoxia ([place]) [J96.01] HCAP (healthcare-associated pneumonia) [J18.9] Sepsis, due to unspecified organism ([place]) [A41.9]  DISCHARGE DIAGNOSIS:  Active Problems:   Sepsis ([place])   SECONDARY DIAGNOSIS:   Past Medical History:  Diagnosis Date  . Anxiety   . Arthritis   . Cataract    right eye  . COPD (chronic obstructive pulmonary disease) ([place])   . Depression   . Dyspnea    DOE  . Dysrhythmia   . Edema    FEET/ANKLES  . GERD (gastroesophageal reflux disease)   . H/O wheezing   . History of hiatal hernia   . HOH (hard of hearing)   . Hypertension   . Incontinence of urine in female   . Orthopnea   . Pain    CHRONIC KNEE  . Pain    CHRONIC KNEE  . Paranoid disorder ([place])   . RLS (restless legs syndrome)   . Tremors of nervous system    HANDS     ADMITTING HISTORY  HISTORY OF PRESENT ILLNESS:  Vicki Morales  is a 77 y.o. female with a known history of COPD not on home oxygen, hypertension, major depression and anxiety requiring Geri psych hospitalization last week, arthritis with bilateral knee pain presents to hospital from home secondary to worsening shortness of breath and fevers. Patient is confused today, very sleepy.  Daughter at bedside provides most of the history.  According to daughter patient psych meds have been changed in February of this year since then her depression was hard to control.  She was evaluated in the emergency room here for the same symptoms last week.  She was admitted to [facility] in [place] for 3 days.  Just discharged home 3 days ago.  She started having some  cough, no fevers, no nausea or vomiting.  Appetite has been good.  Since yesterday daughter noticed that her cough was more thicker.  This morning, patient could not breathe and had some gurgling noise in her throat and so brought to the hospital.  She was hypoxic here on room air and is currently on 2 and half liters oxygen.  Chest x-ray with multilobar right-sided pneumonia.  HOSPITAL COURSE:   Vicki Morales a73 y.o.femalewith a known history of COPD not on home oxygen, hypertension, major depression and anxiety requiring Geri psych hospitalization last week, arthritis with bilateral knee pain presents to hospital from home secondary to worsening shortness of breath and fevers.  *Healthcare acquired pneumonia with sepsis present on admission and acute hypoxic respiratory failure Treated with IV steroids, IV antibiotics and scheduled nebulizers.  Weaned off oxygen slowly. Cultures sent and were no growth by the day of discharge.  Transition to oral antibiotics -Levaquin and prednisone.  She did have fluid overload due to IV fluid resuscitation on admission.  Given 1 dose of IV Lasix and improved.  *COPD exacerbation See above  *Acute kidney injury Resolved  *Hyperkalemia Resolved  * Depression anxiety-stable  Patient discharged in stable condition  CONSULTS OBTAINED:    DRUG ALLERGIES:   Allergies  Allergen Reactions  . Arthrotec [Diclofenac-Misoprostol] Hives  . Codeine Other (See Comments)  Headache   . Diclofenac Other (See Comments) and Hives    Pt unsure allergy  . Hydrochlorothiazide Other (See Comments)    Weakness   . Penicillins Hives    Has patient had a PCN reaction causing immediate rash, facial/tongue/throat swelling, SOB or lightheadedness with hypotension: No Has patient had a PCN reaction causing severe rash involving mucus membranes or skin necrosis: No Has patient had a PCN reaction that required hospitalization: No Has patient had a PCN  reaction occurring within the last 10 years: No If all of the above answers are "NO", then may proceed with Cephalosporin use.  . Sulfa Antibiotics Hives  . Tiotropium Bromide Monohydrate Other (See Comments)    Blurry vision   . Tylenol [Acetaminophen] Other (See Comments)    Doesn't work  . Morphine Hives and Rash  . Pantoprazole Rash    DISCHARGE MEDICATIONS:   Allergies as of 12/06/2017      Reactions   Arthrotec [diclofenac-misoprostol] Hives   Codeine Other (See Comments)   Headache    Diclofenac Other (See Comments), Hives   Pt unsure allergy   Hydrochlorothiazide Other (See Comments)   Weakness    Penicillins Hives   Has patient had a PCN reaction causing immediate rash, facial/tongue/throat swelling, SOB or lightheadedness with hypotension: No Has patient had a PCN reaction causing severe rash involving mucus membranes or skin necrosis: No Has patient had a PCN reaction that required hospitalization: No Has patient had a PCN reaction occurring within the last 10 years: No If all of the above answers are "NO", then may proceed with Cephalosporin use.   Sulfa Antibiotics Hives   Tiotropium Bromide Monohydrate Other (See Comments)   Blurry vision    Tylenol [acetaminophen] Other (See Comments)   Doesn't work   Morphine Hives, Rash   Pantoprazole Rash      Medication List    TAKE these medications   albuterol (2.5 MG/3ML) 0.083% nebulizer solution Commonly known as:  PROVENTIL Take 3 mLs (2.5 mg total) by nebulization 2 (two) times daily as needed for wheezing or shortness of breath.   aspirin EC 325 MG tablet Take 1 tablet (325 mg total) by mouth daily.   benazepril 5 MG tablet Commonly known as:  LOTENSIN TAKE 1 TABLET(5 MG) BY MOUTH DAILY   CALCIUM 600+D 600-400 MG-UNIT tablet Generic drug:  Calcium Carbonate-Vitamin D Take 1 tablet by mouth 3 (three) times daily.   Cranberry 250 MG Caps Take 1 capsule by mouth daily.   Fish Oil 1000 MG Caps Take 1,000  mg by mouth daily.   fluticasone 50 MCG/ACT nasal spray Commonly known as:  FLONASE Place 1 spray into both nostrils daily.   Fluticasone-Umeclidin-Vilant 100-62.5-25 MCG/INH Aepb Commonly known as:  TRELEGY ELLIPTA Inhale 1 puff into the lungs daily.   furosemide 20 MG tablet Commonly known as:  LASIX Take 1 tablet (20 mg total) by mouth daily.   gabapentin 100 MG capsule Commonly known as:  NEURONTIN TAKE 1 CAPSULE(100 MG) BY MOUTH TWICE DAILY   gabapentin 300 MG capsule Commonly known as:  NEURONTIN TAKE 2 CAPSULES(600 MG) BY MOUTH AT BEDTIME   hydrOXYzine 25 MG tablet Commonly known as:  ATARAX/VISTARIL TAKE 1 TABLET(25 MG) BY MOUTH EVERY 8 HOURS AS NEEDED FOR ANXIETY   levofloxacin 750 MG tablet Commonly known as:  LEVAQUIN Take 1 tablet (750 mg total) by mouth daily.   loratadine 10 MG tablet Commonly known as:  CLARITIN Take 10 mg by mouth daily.  magnesium oxide 400 MG tablet Commonly known as:  MAG-OX Take 400 mg by mouth 2 (two) times daily.   Melatonin 10 MG Caps Take 10 mg by mouth at bedtime as needed.   meloxicam 15 MG tablet Commonly known as:  MOBIC TAKE 1 TABLET BY MOUTH EVERY DAY WITH A MEALS   montelukast 10 MG tablet Commonly known as:  SINGULAIR TAKE 1 TABLET BY MOUTH EVERY DAY   multivitamin with minerals Tabs tablet Take 1 tablet by mouth daily.   OSTEO BI-FLEX ADV DOUBLE ST PO Take 1 tablet by mouth 2 (two) times daily.   oxybutynin 10 MG 24 hr tablet Commonly known as:  DITROPAN-XL TAKE 1 TABLET BY MOUTH AT BEDTIME   predniSONE 10 MG (21) Tbpk tablet Commonly known as:  STERAPRED UNI-PAK 21 TAB 6 tabs day 1 and taper 10 mg a day - 6 days   tiZANidine 4 MG tablet Commonly known as:  ZANAFLEX Take 1 tablet (4 mg total) by mouth every 8 (eight) hours as needed for muscle spasms.   traMADol 50 MG tablet Commonly known as:  ULTRAM TAKE 2 TABLETS BY MOUTH TWICE DAILY   traZODone 50 MG tablet Commonly known as:  DESYREL Take  0.5-1 tablets (25-50 mg total) by mouth at bedtime as needed for sleep.   WIXELA INHUB 250-50 MCG/DOSE Aepb Generic drug:  Fluticasone-Salmeterol Inhale 1 puff into the lungs 2 (two) times daily.       Today   VITAL SIGNS:  Blood pressure 95/79, pulse 89, temperature (!) 97.5 F (36.4 C), temperature source Oral, resp. rate 20, height 5\' 4"  (1.626 m), weight 122.1 kg (269 lb 3.2 oz), SpO2 94 %.  I/O:  No intake or output data in the 24 hours ending 12/15/17 1757  PHYSICAL EXAMINATION:  Physical Exam  GENERAL:  77 y.o.-year-old patient lying in the bed with no acute distress.  LUNGS: Normal breath sounds bilaterally, no wheezing, rales,rhonchi or crepitation. No use of accessory muscles of respiration.  CARDIOVASCULAR: S1, S2 normal. No murmurs, rubs, or gallops.  ABDOMEN: Soft, non-tender, non-distended. Bowel sounds present. No organomegaly or mass.  NEUROLOGIC: Moves all 4 extremities. PSYCHIATRIC: The patient is alert and oriented x 3.  SKIN: No obvious rash, lesion, or ulcer.   DATA REVIEW:   CBC Recent Labs  Lab 12/11/17 1508  WBC 10.8  HGB 12.7  HCT 37.7  PLT 169    Chemistries  Recent Labs  Lab 12/11/17 1417  NA 137  K 5.2*  CL 98  CO2 32  GLUCOSE 74  BUN 15  CREATININE 0.75  CALCIUM 8.7*  AST 22  ALT 15  ALKPHOS 45  BILITOT 0.9    Cardiac Enzymes Recent Labs  Lab 12/11/17 1417  TROPONINI <0.03    Microbiology Results  Results for orders placed or performed during the hospital encounter of 12/03/17  Blood Culture (routine x 2)     Status: None   Collection Time: 12/03/17  8:13 AM  Result Value Ref Range Status   Specimen Description LEFT ANTECUBITAL  Final   Special Requests   Final    BOTTLES DRAWN AEROBIC AND ANAEROBIC Blood Culture results may not be optimal due to an inadequate volume of blood received in culture bottles   Culture   Final    NO GROWTH 5 DAYS Performed at [facility], [address], [place], [place]  [address]    Report Status 12/08/2017 FINAL  Final  Blood Culture (routine x 2)  Status: None   Collection Time: 12/03/17  8:13 AM  Result Value Ref Range Status   Specimen Description BLOOD LEFT HAND  Final   Special Requests   Final    BOTTLES DRAWN AEROBIC AND ANAEROBIC Blood Culture adequate volume   Culture   Final    NO GROWTH 5 DAYS Performed at [facility], [address], [place], [place] [address]    Report Status 12/08/2017 FINAL  Final  Urine culture     Status: None   Collection Time: 12/03/17  8:13 AM  Result Value Ref Range Status   Specimen Description   Final    URINE, CATHETERIZED Performed at [facility], [address], [place], [place] [address]    Special Requests   Final    NONE Performed at [facility], [address] [place], [place] [address]    Culture   Final    NO GROWTH Performed at [facility], [place] [address] [place], [place] [address]    Report Status 12/04/2017 FINAL  Final  MRSA PCR Screening     Status: None   Collection Time: 12/03/17  1:01 PM  Result Value Ref Range Status   MRSA by PCR NEGATIVE NEGATIVE Final    Comment:        The GeneXpert MRSA Assay (FDA approved for NASAL specimens only), is one component of a comprehensive MRSA colonization surveillance program. It is not intended to diagnose MRSA infection nor to guide or monitor treatment for MRSA infections. Performed at [facility], [address], [place],  [address]     RADIOLOGY:  No results found.  Follow up with PCP in 1 week.  Management plans discussed with the patient, family and they are in agreement.  CODE STATUS:  Code Status History    Date Active Date Inactive Code Status Order ID Comments User Context   12/03/2017 1258 12/06/2017 1847 DNR [id number]  Gladstone Lighter, MD Inpatient   10/24/2017 1844 10/26/2017 1623 DNR [id number]  Vaughan Basta, MD Inpatient   09/23/2015 0018 09/23/2015  1823 DNR [id number]  Lance Coon, MD Inpatient    Questions for Most Recent Historical Code Status (Order [id number])    Question Answer Comment   In the event of cardiac or respiratory ARREST Do not call a "code blue"    In the event of cardiac or respiratory ARREST Do not perform Intubation, CPR, defibrillation or ACLS    In the event of cardiac or respiratory ARREST Use medication by any route, position, wound care, and other measures to relive pain and suffering. May use oxygen, suction and manual treatment of airway obstruction as needed for comfort.         Advance Directive Documentation     Most Recent Value  Type of Advance Directive  Healthcare Power of Attorney  Pre-existing out of facility DNR order (yellow form or pink MOST form)  -  "MOST" Form in Place?  -      TOTAL TIME TAKING CARE OF THIS PATIENT ON DAY OF DISCHARGE: more than 30 minutes.   Neita Carp M.D on 12/15/2017 at 5:57 PM  Between 7am to 6pm - Pager - [phone]  After 6pm go to [email or web address] - password [email or web address] [facility]  [facility]  Office  [phone]  CC: Primary care physician; Vicki Sons, MD  Note: This dictation was prepared with Dragon dictation along with smaller phrase technology. Any transcriptional errors that result from this process are  unintentional.

## 2017-12-16 DIAGNOSIS — J449 Chronic obstructive pulmonary disease, unspecified: Secondary | ICD-10-CM | POA: Diagnosis not present

## 2017-12-16 DIAGNOSIS — R531 Weakness: Secondary | ICD-10-CM | POA: Diagnosis not present

## 2017-12-16 DIAGNOSIS — I10 Essential (primary) hypertension: Secondary | ICD-10-CM | POA: Diagnosis not present

## 2017-12-16 DIAGNOSIS — F203 Undifferentiated schizophrenia: Secondary | ICD-10-CM | POA: Diagnosis not present

## 2017-12-20 ENCOUNTER — Ambulatory Visit: Payer: Self-pay | Admitting: Family Medicine

## 2017-12-27 ENCOUNTER — Other Ambulatory Visit: Payer: Medicare HMO

## 2018-01-06 ENCOUNTER — Other Ambulatory Visit: Payer: Self-pay

## 2018-01-06 NOTE — Patient Outreach (Signed)
[facility]) Care Management  01/06/2018  Vicki Morales 11/27/1940 [id number]   Telephone Screen  Referral Date: 01/06/18 Referral Source: [place] Dept Referral Reason: " member is in need of [place] and some guidance for family" Insurance: [employer]   Outreach attempt # 1 to patient. No answer at present. RN CM left HIPAA compliant voicemail message along with contact info.      Plan: RN CM will make outreach attempt to patient within 3-4 business days. RN CM will send unsuccessful outreach letter to patient.   Enzo Montgomery, RN,BSN,CCM [place] Management Telephonic Care Management Coordinator Direct Phone: [phone] Toll Free: [phone] Fax: [phone]

## 2018-01-09 ENCOUNTER — Telehealth: Payer: Self-pay

## 2018-01-09 ENCOUNTER — Other Ambulatory Visit: Payer: Self-pay | Admitting: *Deleted

## 2018-01-09 NOTE — Telephone Encounter (Signed)
Called, left message to call back.

## 2018-01-09 NOTE — Telephone Encounter (Signed)
PT LEFT MESSAGE THAT SHE NEEDS YOU TO CONTACT HER DAUGHTER

## 2018-01-09 NOTE — Telephone Encounter (Signed)
I left a message for daughter to contact me earlier.

## 2018-01-09 NOTE — Telephone Encounter (Signed)
PT CALLED AGAIN LEFT MESSAGE THAT SHE WILL NOT BE IN TOMORROW FOR HER APPT AND THAT SHE WANTED YOU TO SPEAK WITH HER DAUGHTER  AND TO PLEASE CALL HER DAUGHTER.

## 2018-01-09 NOTE — Patient Outreach (Signed)
[facility]) Care Management  01/09/2018  ALEAH AHLGRIM 1940/12/18 [id number]   Opened in error   Joelene Millin L. Lavina Hamman, RN, BSN, [place] Coordinator Office number [phone] Mobile number [phone]  Main [facility] number [phone] Fax number [phone]

## 2018-01-09 NOTE — Telephone Encounter (Signed)
pt called states that she is in a rehab place and that she has appt for tomorrow but she dont know if they are going to bring her.  pt wanted to know if dr. Shea Evans would call her daughter and speak with her  [phone]

## 2018-01-10 ENCOUNTER — Telehealth: Payer: Self-pay | Admitting: Psychiatry

## 2018-01-10 ENCOUNTER — Ambulatory Visit: Payer: Medicare HMO | Admitting: Psychiatry

## 2018-01-10 NOTE — Telephone Encounter (Signed)
Spoke to daughter per patient request. Pt is at a Rehab place and hence wants appointment rescheduled to a month from now. Pt is doing well per daughter. Will ask Truman Hayward to make changes.

## 2018-01-11 ENCOUNTER — Ambulatory Visit: Payer: Medicare HMO | Admitting: Psychiatry

## 2018-01-11 ENCOUNTER — Other Ambulatory Visit: Payer: Self-pay

## 2018-01-11 DIAGNOSIS — H6120 Impacted cerumen, unspecified ear: Secondary | ICD-10-CM | POA: Diagnosis not present

## 2018-01-11 NOTE — Patient Outreach (Signed)
[facility]) Care Management  01/11/2018  Vicki Morales 1940-10-14 [id number]  TELEPHONE SCREENING Referral date: 01/06/18 Referral source: [facility] Referral reason: in need of nurse care and guidance for family Insurance: [employer] Attempt #1  Telephone call to patient regarding referral. Unable to reach. HIPAA compliant voice message left with call back phone number.   PLAN: RNCM will attempt 2nd telephone call to patient within 4 business days. RNCM will send outreach letter.   Quinn Plowman RN,BSN,CCM [facility] Telephonic  [phone]

## 2018-01-12 ENCOUNTER — Other Ambulatory Visit: Payer: Self-pay

## 2018-01-12 NOTE — Patient Outreach (Addendum)
[facility]) Care Management  01/12/2018  Vicki Morales [birth year] [id number]   Telephone Screen  Referral Date: 01/06/18 Referral Source: [place] Dept Referral Reason: " member is in need of [place] and some guidance for family" Insurance: [employer]   Outreach attempt #3 to patient. Spoke with both patient and her daughter-Catherine. RN CM reviewed referral reason with them. They were both confused stating that they do not know "where that info came from." They both voice that they have not spoken with anyone at [facility] regarding this issue. They are both unaware of what the [facility] Care program is. Advised them that it is not associated with [facility]. Discussed [facility] services. Daughter reports that patient is still currently in rehab and should be discharged within the next 2-3 days if all goes well. She states she has already spoken with staff and patient will have [facility] health services set up prior to discharge. Daughter denies needing [facility] services at this time.       Plan: RN CM will close case at this time. [facility] letter and brochure has already been mailed to patient for future reference.   Enzo Montgomery, RN,BSN,CCM [place] Management Telephonic Care Management Coordinator Direct Phone: [phone] Toll Free: [phone] Fax: [phone]

## 2018-01-13 DIAGNOSIS — J449 Chronic obstructive pulmonary disease, unspecified: Secondary | ICD-10-CM | POA: Diagnosis not present

## 2018-01-13 DIAGNOSIS — F203 Undifferentiated schizophrenia: Secondary | ICD-10-CM | POA: Diagnosis not present

## 2018-01-13 DIAGNOSIS — I10 Essential (primary) hypertension: Secondary | ICD-10-CM | POA: Diagnosis not present

## 2018-01-13 DIAGNOSIS — R531 Weakness: Secondary | ICD-10-CM | POA: Diagnosis not present

## 2018-01-20 ENCOUNTER — Telehealth (INDEPENDENT_AMBULATORY_CARE_PROVIDER_SITE_OTHER): Payer: BLUE CROSS/BLUE SHIELD | Admitting: Family Medicine

## 2018-01-20 ENCOUNTER — Telehealth: Payer: Self-pay

## 2018-01-20 DIAGNOSIS — F419 Anxiety disorder, unspecified: Secondary | ICD-10-CM | POA: Diagnosis not present

## 2018-01-20 DIAGNOSIS — R350 Frequency of micturition: Secondary | ICD-10-CM | POA: Diagnosis not present

## 2018-01-20 DIAGNOSIS — N390 Urinary tract infection, site not specified: Secondary | ICD-10-CM | POA: Diagnosis not present

## 2018-01-20 DIAGNOSIS — I10 Essential (primary) hypertension: Secondary | ICD-10-CM | POA: Diagnosis not present

## 2018-01-20 DIAGNOSIS — M545 Low back pain: Secondary | ICD-10-CM | POA: Diagnosis not present

## 2018-01-20 DIAGNOSIS — M7540 Impingement syndrome of unspecified shoulder: Secondary | ICD-10-CM | POA: Diagnosis not present

## 2018-01-20 DIAGNOSIS — I272 Pulmonary hypertension, unspecified: Secondary | ICD-10-CM | POA: Diagnosis not present

## 2018-01-20 DIAGNOSIS — F329 Major depressive disorder, single episode, unspecified: Secondary | ICD-10-CM | POA: Diagnosis not present

## 2018-01-20 DIAGNOSIS — G8929 Other chronic pain: Secondary | ICD-10-CM | POA: Diagnosis not present

## 2018-01-20 DIAGNOSIS — F209 Schizophrenia, unspecified: Secondary | ICD-10-CM | POA: Diagnosis not present

## 2018-01-20 DIAGNOSIS — J449 Chronic obstructive pulmonary disease, unspecified: Secondary | ICD-10-CM | POA: Diagnosis not present

## 2018-01-20 LAB — POCT URINALYSIS DIPSTICK
BILIRUBIN UA: NEGATIVE
Glucose, UA: NEGATIVE
Ketones, UA: NEGATIVE
Nitrite, UA: NEGATIVE
Protein, UA: NEGATIVE
RBC UA: NEGATIVE
SPEC GRAV UA: 1.01 (ref 1.010–1.025)
Urobilinogen, UA: 0.2 E.U./dL
pH, UA: 7 (ref 5.0–8.0)

## 2018-01-20 MED ORDER — CIPROFLOXACIN HCL 500 MG PO TABS: 500.0000 mg | ORAL_TABLET | Freq: Two times a day (BID) | ORAL | 0 refills | Status: AC

## 2018-01-20 NOTE — Telephone Encounter (Signed)
Patient advised that prescription has been sent to the pharmacy. Order for urine culture placed and sent to the lab.

## 2018-01-20 NOTE — Telephone Encounter (Signed)
Patient requested if a RX is needed she wants it sent to [place].

## 2018-01-20 NOTE — Telephone Encounter (Signed)
-----   Message from Birdie Sons, MD sent at 01/20/2018  3:23 PM EDT ----- Please send for culture. Have sent in prescription for antibiotic.

## 2018-01-20 NOTE — Telephone Encounter (Signed)
Patient believes she has a UTI. She states she has had symptoms of urinary frequency and fatigue. Patient says the last time she had these symptoms she had a UTI. Patient has a Film/video editor coming out to her home today. Patient would like to know if the nurse can collect a urine specimen and bring it up to our office to be checked? Please advise.

## 2018-01-20 NOTE — Telephone Encounter (Signed)
That's fine. Can have nurse get urine for u/a

## 2018-01-20 NOTE — Telephone Encounter (Signed)
pt called saying she just got out of rehab for pneumonia.  She thinks she might have a UTI. She has a Film/video editor coming by today that said she would get a urine sample form her and bring it it to our office if that's is ok.    She does not feel like coming in for a visit.  Pt's CB#  [phone]  Thanks Con Memos

## 2018-01-20 NOTE — Telephone Encounter (Signed)
Urine sample brought in by patients home health nurse. Please review results.

## 2018-01-22 DIAGNOSIS — F329 Major depressive disorder, single episode, unspecified: Secondary | ICD-10-CM | POA: Diagnosis not present

## 2018-01-22 DIAGNOSIS — G8929 Other chronic pain: Secondary | ICD-10-CM | POA: Diagnosis not present

## 2018-01-22 DIAGNOSIS — F209 Schizophrenia, unspecified: Secondary | ICD-10-CM | POA: Diagnosis not present

## 2018-01-22 DIAGNOSIS — M7540 Impingement syndrome of unspecified shoulder: Secondary | ICD-10-CM | POA: Diagnosis not present

## 2018-01-22 DIAGNOSIS — I272 Pulmonary hypertension, unspecified: Secondary | ICD-10-CM | POA: Diagnosis not present

## 2018-01-22 DIAGNOSIS — F419 Anxiety disorder, unspecified: Secondary | ICD-10-CM | POA: Diagnosis not present

## 2018-01-22 DIAGNOSIS — M545 Low back pain: Secondary | ICD-10-CM | POA: Diagnosis not present

## 2018-01-22 DIAGNOSIS — I10 Essential (primary) hypertension: Secondary | ICD-10-CM | POA: Diagnosis not present

## 2018-01-22 DIAGNOSIS — J449 Chronic obstructive pulmonary disease, unspecified: Secondary | ICD-10-CM | POA: Diagnosis not present

## 2018-01-22 LAB — URINE CULTURE

## 2018-01-23 ENCOUNTER — Telehealth: Payer: Self-pay | Admitting: Family Medicine

## 2018-01-23 DIAGNOSIS — F329 Major depressive disorder, single episode, unspecified: Secondary | ICD-10-CM | POA: Diagnosis not present

## 2018-01-23 DIAGNOSIS — I10 Essential (primary) hypertension: Secondary | ICD-10-CM | POA: Diagnosis not present

## 2018-01-23 DIAGNOSIS — M545 Low back pain: Secondary | ICD-10-CM | POA: Diagnosis not present

## 2018-01-23 DIAGNOSIS — J449 Chronic obstructive pulmonary disease, unspecified: Secondary | ICD-10-CM | POA: Diagnosis not present

## 2018-01-23 DIAGNOSIS — F419 Anxiety disorder, unspecified: Secondary | ICD-10-CM | POA: Diagnosis not present

## 2018-01-23 DIAGNOSIS — I272 Pulmonary hypertension, unspecified: Secondary | ICD-10-CM | POA: Diagnosis not present

## 2018-01-23 DIAGNOSIS — G8929 Other chronic pain: Secondary | ICD-10-CM | POA: Diagnosis not present

## 2018-01-23 DIAGNOSIS — M7540 Impingement syndrome of unspecified shoulder: Secondary | ICD-10-CM | POA: Diagnosis not present

## 2018-01-23 DIAGNOSIS — F209 Schizophrenia, unspecified: Secondary | ICD-10-CM | POA: Diagnosis not present

## 2018-01-23 NOTE — Telephone Encounter (Signed)
Allyson with [facility] needs orders to see Ms. Ahlers one time a week for 6 weeks.

## 2018-01-23 NOTE — Telephone Encounter (Signed)
Vicki Morales with [facility] also called needing verbal  OT for 2 times a week for three weeks and 1 one time for on week  CB#  [phone]  Thanks teri

## 2018-01-23 NOTE — Telephone Encounter (Signed)
That's fine

## 2018-01-23 NOTE — Telephone Encounter (Signed)
Stephanie advised.   Thanks,   -Laura  

## 2018-01-23 NOTE — Telephone Encounter (Signed)
Please advise 

## 2018-01-24 ENCOUNTER — Telehealth: Payer: Self-pay | Admitting: Family Medicine

## 2018-01-24 NOTE — Telephone Encounter (Signed)
Pt stated that she started having diarrhea this morning and she hasn't been able to make it to the bathroom at times. Pt is requesting advise or medication sent to [facility] [address]. Pt was advised that we don't treat over the phone and was offered an appt. Pt stated that she can't come in because she can't control her bowels. Pt requested she speak with a medical assistant. Please advise. Thanks TNP

## 2018-01-24 NOTE — Telephone Encounter (Signed)
Please advise 

## 2018-01-24 NOTE — Telephone Encounter (Signed)
Vicki Morales was given verbal orders.

## 2018-01-24 NOTE — Telephone Encounter (Signed)
Pt called back to make sure we have her correct phone number. I confirmed phone number is correct. Pt asked for an update about what she can try. Pt stated that she has to get better before she can come to the office because she is afraid she would mess herself on the way to the office or while at the office. Please advise. Thanks TNP

## 2018-01-24 NOTE — Telephone Encounter (Signed)
Vicki Morales with [facility] is requesting verbal orders for in home nursing as follows:  Once a week for 7 weeks  Please advise. Thanks TNP

## 2018-01-24 NOTE — Telephone Encounter (Signed)
That's fine

## 2018-01-24 NOTE — Telephone Encounter (Signed)
Pt called back wanting to know if we have any advise for her regarding her upset stomach.  Vicki Morales

## 2018-01-25 ENCOUNTER — Telehealth: Payer: Self-pay | Admitting: Family Medicine

## 2018-01-25 NOTE — Telephone Encounter (Signed)
Is she has diarrhea more than 24 hours she can take OTC imodium. If she has had any blood in stool, fever, or severe abdominal pains then she needs o.v. If diarrhea last more than 3 days then she needs o.v.

## 2018-01-25 NOTE — Telephone Encounter (Signed)
LMOVM for pt to return call 

## 2018-01-25 NOTE — Telephone Encounter (Signed)
That's fine

## 2018-01-25 NOTE — Telephone Encounter (Signed)
Patient was advised. Expressed understanding.  

## 2018-01-25 NOTE — Telephone Encounter (Signed)
Patient is returning a call to Michelle °

## 2018-01-25 NOTE — Telephone Encounter (Signed)
Vicki Morales with [facility] is requesting verbal orders for in home physical therapy as follows:  Twice a week for 4 weeks.  Please advise. Thanks TNP

## 2018-01-25 NOTE — Telephone Encounter (Signed)
Please advise 

## 2018-01-26 ENCOUNTER — Telehealth: Payer: Self-pay | Admitting: Family Medicine

## 2018-01-26 ENCOUNTER — Telehealth: Payer: Self-pay | Admitting: *Deleted

## 2018-01-26 DIAGNOSIS — M7540 Impingement syndrome of unspecified shoulder: Secondary | ICD-10-CM | POA: Diagnosis not present

## 2018-01-26 DIAGNOSIS — F419 Anxiety disorder, unspecified: Secondary | ICD-10-CM | POA: Diagnosis not present

## 2018-01-26 DIAGNOSIS — J449 Chronic obstructive pulmonary disease, unspecified: Secondary | ICD-10-CM | POA: Diagnosis not present

## 2018-01-26 DIAGNOSIS — F329 Major depressive disorder, single episode, unspecified: Secondary | ICD-10-CM | POA: Diagnosis not present

## 2018-01-26 DIAGNOSIS — M545 Low back pain: Secondary | ICD-10-CM | POA: Diagnosis not present

## 2018-01-26 DIAGNOSIS — F209 Schizophrenia, unspecified: Secondary | ICD-10-CM | POA: Diagnosis not present

## 2018-01-26 DIAGNOSIS — I272 Pulmonary hypertension, unspecified: Secondary | ICD-10-CM | POA: Diagnosis not present

## 2018-01-26 DIAGNOSIS — G8929 Other chronic pain: Secondary | ICD-10-CM | POA: Diagnosis not present

## 2018-01-26 DIAGNOSIS — I10 Essential (primary) hypertension: Secondary | ICD-10-CM | POA: Diagnosis not present

## 2018-01-26 NOTE — Telephone Encounter (Signed)
Kim from [facility] home health called for verbal order for a portable chest x-ray. Patient has increased shortness of breath, cough and wheezing. Per Dr. Rosanna Randy, verbal okay given.

## 2018-01-26 NOTE — Telephone Encounter (Signed)
Left detailed message on Christina's vm giving verbal okay.

## 2018-01-26 NOTE — Telephone Encounter (Signed)
Patient wants to know if she needs another Pneumonia Vaccine since she had pneumonia twice this year.

## 2018-01-26 NOTE — Telephone Encounter (Signed)
Please advise 

## 2018-01-27 DIAGNOSIS — F419 Anxiety disorder, unspecified: Secondary | ICD-10-CM | POA: Diagnosis not present

## 2018-01-27 DIAGNOSIS — M545 Low back pain: Secondary | ICD-10-CM | POA: Diagnosis not present

## 2018-01-27 DIAGNOSIS — F209 Schizophrenia, unspecified: Secondary | ICD-10-CM | POA: Diagnosis not present

## 2018-01-27 DIAGNOSIS — J449 Chronic obstructive pulmonary disease, unspecified: Secondary | ICD-10-CM | POA: Diagnosis not present

## 2018-01-27 DIAGNOSIS — G8929 Other chronic pain: Secondary | ICD-10-CM | POA: Diagnosis not present

## 2018-01-27 DIAGNOSIS — M7540 Impingement syndrome of unspecified shoulder: Secondary | ICD-10-CM | POA: Diagnosis not present

## 2018-01-27 DIAGNOSIS — F329 Major depressive disorder, single episode, unspecified: Secondary | ICD-10-CM | POA: Diagnosis not present

## 2018-01-27 DIAGNOSIS — I10 Essential (primary) hypertension: Secondary | ICD-10-CM | POA: Diagnosis not present

## 2018-01-27 DIAGNOSIS — I272 Pulmonary hypertension, unspecified: Secondary | ICD-10-CM | POA: Diagnosis not present

## 2018-01-27 NOTE — Telephone Encounter (Signed)
No, she has had both.

## 2018-01-27 NOTE — Telephone Encounter (Signed)
Patient was advised.  

## 2018-01-29 ENCOUNTER — Other Ambulatory Visit: Payer: Self-pay | Admitting: Family Medicine

## 2018-01-30 DIAGNOSIS — F419 Anxiety disorder, unspecified: Secondary | ICD-10-CM | POA: Diagnosis not present

## 2018-01-30 DIAGNOSIS — M545 Low back pain: Secondary | ICD-10-CM | POA: Diagnosis not present

## 2018-01-30 DIAGNOSIS — F209 Schizophrenia, unspecified: Secondary | ICD-10-CM | POA: Diagnosis not present

## 2018-01-30 DIAGNOSIS — I10 Essential (primary) hypertension: Secondary | ICD-10-CM | POA: Diagnosis not present

## 2018-01-30 DIAGNOSIS — F329 Major depressive disorder, single episode, unspecified: Secondary | ICD-10-CM | POA: Diagnosis not present

## 2018-01-30 DIAGNOSIS — J449 Chronic obstructive pulmonary disease, unspecified: Secondary | ICD-10-CM | POA: Diagnosis not present

## 2018-01-30 DIAGNOSIS — I272 Pulmonary hypertension, unspecified: Secondary | ICD-10-CM | POA: Diagnosis not present

## 2018-01-30 DIAGNOSIS — G8929 Other chronic pain: Secondary | ICD-10-CM | POA: Diagnosis not present

## 2018-01-30 DIAGNOSIS — M7540 Impingement syndrome of unspecified shoulder: Secondary | ICD-10-CM | POA: Diagnosis not present

## 2018-01-31 ENCOUNTER — Telehealth: Payer: Self-pay | Admitting: Family Medicine

## 2018-01-31 NOTE — Telephone Encounter (Signed)
Please advise 

## 2018-01-31 NOTE — Telephone Encounter (Signed)
Pt stated that since being discharged from hospital [facility] has been coming to pt's home. Pt stated that [facility] has been trying to get someone to come to pt's home for a chest Xray. Pt is requesting a status update on getting someone to come to her home with a portable Xray to do chest Xrays to rule out pneumonia. Please advise. Thanks TNP

## 2018-01-31 NOTE — Telephone Encounter (Signed)
See telephone message from 01-26-2018. Portable xray was ordered. I have no idea why it hasn't been done. Can call home health to find out what the problem is.

## 2018-02-01 DIAGNOSIS — I10 Essential (primary) hypertension: Secondary | ICD-10-CM | POA: Diagnosis not present

## 2018-02-01 DIAGNOSIS — M545 Low back pain: Secondary | ICD-10-CM | POA: Diagnosis not present

## 2018-02-01 DIAGNOSIS — M7540 Impingement syndrome of unspecified shoulder: Secondary | ICD-10-CM | POA: Diagnosis not present

## 2018-02-01 DIAGNOSIS — G8929 Other chronic pain: Secondary | ICD-10-CM | POA: Diagnosis not present

## 2018-02-01 DIAGNOSIS — J449 Chronic obstructive pulmonary disease, unspecified: Secondary | ICD-10-CM | POA: Diagnosis not present

## 2018-02-01 DIAGNOSIS — F209 Schizophrenia, unspecified: Secondary | ICD-10-CM | POA: Diagnosis not present

## 2018-02-01 DIAGNOSIS — F329 Major depressive disorder, single episode, unspecified: Secondary | ICD-10-CM | POA: Diagnosis not present

## 2018-02-01 DIAGNOSIS — F419 Anxiety disorder, unspecified: Secondary | ICD-10-CM | POA: Diagnosis not present

## 2018-02-01 DIAGNOSIS — I272 Pulmonary hypertension, unspecified: Secondary | ICD-10-CM | POA: Diagnosis not present

## 2018-02-01 NOTE — Telephone Encounter (Signed)
LMOVM for Ebony Hail to call back.

## 2018-02-02 ENCOUNTER — Encounter: Payer: Self-pay | Admitting: Family Medicine

## 2018-02-02 ENCOUNTER — Telehealth: Payer: Self-pay | Admitting: Family Medicine

## 2018-02-02 DIAGNOSIS — R0602 Shortness of breath: Secondary | ICD-10-CM | POA: Diagnosis not present

## 2018-02-02 NOTE — Telephone Encounter (Signed)
Any recommendations? Please advise

## 2018-02-02 NOTE — Telephone Encounter (Signed)
Pt states she has had a sour taste in her mouth which makes her not want to eat anything.  Has also had diarrhea,  This has been going on for 1 week.  She is requesting something OTC until she can get into see Dr. Caryn Section next Friday.

## 2018-02-03 DIAGNOSIS — F209 Schizophrenia, unspecified: Secondary | ICD-10-CM | POA: Diagnosis not present

## 2018-02-03 DIAGNOSIS — I272 Pulmonary hypertension, unspecified: Secondary | ICD-10-CM | POA: Diagnosis not present

## 2018-02-03 DIAGNOSIS — M545 Low back pain: Secondary | ICD-10-CM | POA: Diagnosis not present

## 2018-02-03 DIAGNOSIS — J449 Chronic obstructive pulmonary disease, unspecified: Secondary | ICD-10-CM | POA: Diagnosis not present

## 2018-02-03 DIAGNOSIS — G8929 Other chronic pain: Secondary | ICD-10-CM | POA: Diagnosis not present

## 2018-02-03 DIAGNOSIS — I10 Essential (primary) hypertension: Secondary | ICD-10-CM | POA: Diagnosis not present

## 2018-02-03 DIAGNOSIS — F329 Major depressive disorder, single episode, unspecified: Secondary | ICD-10-CM | POA: Diagnosis not present

## 2018-02-03 DIAGNOSIS — M7540 Impingement syndrome of unspecified shoulder: Secondary | ICD-10-CM | POA: Diagnosis not present

## 2018-02-03 DIAGNOSIS — F419 Anxiety disorder, unspecified: Secondary | ICD-10-CM | POA: Diagnosis not present

## 2018-02-03 NOTE — Telephone Encounter (Signed)
Got xray report back. Everything is normal. No sign of infection or pneumonia.

## 2018-02-03 NOTE — Telephone Encounter (Signed)
Pt advised.  She states her diarrhea has improved.  She is now complaining of left sided abdominal pain and swelling.  She states she just feels "Terrible".  I advised her she would need to be seen.  I offered her the 4pm apt she states she can not get here that fast.  She did state she will go to an Urgent Care if she needs to.   Thanks,  -Mickel Baas

## 2018-02-03 NOTE — Telephone Encounter (Signed)
She can take OTC imodium for diarrhea. There is nothing to take for sour taste. It will probably go away in a few more days.

## 2018-02-06 DIAGNOSIS — M7540 Impingement syndrome of unspecified shoulder: Secondary | ICD-10-CM | POA: Diagnosis not present

## 2018-02-06 DIAGNOSIS — F419 Anxiety disorder, unspecified: Secondary | ICD-10-CM | POA: Diagnosis not present

## 2018-02-06 DIAGNOSIS — F329 Major depressive disorder, single episode, unspecified: Secondary | ICD-10-CM | POA: Diagnosis not present

## 2018-02-06 DIAGNOSIS — I272 Pulmonary hypertension, unspecified: Secondary | ICD-10-CM | POA: Diagnosis not present

## 2018-02-06 DIAGNOSIS — I10 Essential (primary) hypertension: Secondary | ICD-10-CM | POA: Diagnosis not present

## 2018-02-06 DIAGNOSIS — M545 Low back pain: Secondary | ICD-10-CM | POA: Diagnosis not present

## 2018-02-06 DIAGNOSIS — J449 Chronic obstructive pulmonary disease, unspecified: Secondary | ICD-10-CM | POA: Diagnosis not present

## 2018-02-06 DIAGNOSIS — F209 Schizophrenia, unspecified: Secondary | ICD-10-CM | POA: Diagnosis not present

## 2018-02-06 DIAGNOSIS — G8929 Other chronic pain: Secondary | ICD-10-CM | POA: Diagnosis not present

## 2018-02-07 ENCOUNTER — Encounter: Payer: Self-pay | Admitting: Psychiatry

## 2018-02-07 ENCOUNTER — Ambulatory Visit (INDEPENDENT_AMBULATORY_CARE_PROVIDER_SITE_OTHER): Payer: BLUE CROSS/BLUE SHIELD | Admitting: Psychiatry

## 2018-02-07 ENCOUNTER — Other Ambulatory Visit: Payer: Self-pay

## 2018-02-07 VITALS — BP 139/66 | HR 65

## 2018-02-07 DIAGNOSIS — F200 Paranoid schizophrenia: Secondary | ICD-10-CM

## 2018-02-07 DIAGNOSIS — F331 Major depressive disorder, recurrent, moderate: Secondary | ICD-10-CM

## 2018-02-07 DIAGNOSIS — F410 Panic disorder [episodic paroxysmal anxiety] without agoraphobia: Secondary | ICD-10-CM

## 2018-02-07 MED ORDER — TRAZODONE HCL 100 MG PO TABS: 100.0000 mg | ORAL_TABLET | Freq: Every day | ORAL | 1 refills | Status: DC

## 2018-02-07 NOTE — Progress Notes (Signed)
[facility] MD OP Progress Note  02/07/2018 5:21 PM Vicki Morales  MRN:  [id number]  Chief Complaint: ' I am here for follow up.' Chief Complaint    Follow-up; Anxiety; Other; Medication Problem     HPI: Vicki Morales is a 77 year old Caucasian female, widowed, retired, lives in [place], presented to the clinic today with her daughter.  Patient has a history of schizophrenia as well as MDD.  Patient today reports that she continues to struggle with some racing thoughts as well as sleep issues.  She reports she tried taking the trazodone 50 mg as well as melatonin in combination with it.  Patient however reports that does not help.  Patient continues to be on risperidone and BuSpar as prescribed.  Patient denies any side effects to the risperidone.  She denies any rigidity, tremors.  She reports she is tolerating the BuSpar well also.  She wants to stay on this dosage at this time.  Patient continues to struggle with medical problems like COPD.  Patient reports some shortness of breath when she tries to walk however reports it is getting better.  She was recently discharged from a rehabilitation facility after she had pneumonia.  Patient continues to have good support system from her daughter.  Patient denies any perceptual disturbances.  Patient use denies any suicidality or homicidality Visit Diagnosis:    ICD-10-CM   1. Schizophrenia, paranoid ([place]) F20.0 traZODone (DESYREL) 100 MG tablet  2. Panic attacks F41.0   3. MDD (major depressive disorder), recurrent episode, moderate ([place]) F33.1     Past Psychiatric History: Reviewed past psychiatric history from my progress note on 09/05/2017  Past Medical History:  Past Medical History:  Diagnosis Date  . Anxiety   . Arthritis   . Cataract    right eye  . COPD (chronic obstructive pulmonary disease) ([place])   . Depression   . Dyspnea    DOE  . Dysrhythmia   . Edema    FEET/ANKLES  . GERD (gastroesophageal reflux disease)   . H/O wheezing    . History of hiatal hernia   . HOH (hard of hearing)   . Hypertension   . Incontinence of urine in female   . Orthopnea   . Pain    CHRONIC KNEE  . Pain    CHRONIC KNEE  . Paranoid disorder ([place])   . RLS (restless legs syndrome)   . Tremors of nervous system    HANDS    Past Surgical History:  Procedure Laterality Date  . ABDOMINAL HYSTERECTOMY  1988  . APPENDECTOMY  1988  . CARDIAC CATHETERIZATION    . CATARACT EXTRACTION W/PHACO Right 07/14/2016   Procedure: CATARACT EXTRACTION PHACO AND INTRAOCULAR LENS PLACEMENT ([place]) anterior vitrectomy;  Surgeon: Estill Cotta, MD;  Location: [facility] ORS;  Service: Ophthalmology;  Laterality: Right;  [place] 02:40 AP% 24.4 CDE 59.98 Fluid pack # [id number]  . CATARACT EXTRACTION W/PHACO Left 02/16/2017   Procedure: CATARACT EXTRACTION PHACO AND INTRAOCULAR LENS PLACEMENT (IOC);  Surgeon: Estill Cotta, MD;  Location: [facility] ORS;  Service: Ophthalmology;  Laterality: Left;  Lot # [id number] H [place]: 01:11.8 AP%: 23.5 CDE: 32.09   . ESOPHAGOGASTRODUODENOSCOPY N/A 11/11/2014   Procedure: ESOPHAGOGASTRODUODENOSCOPY (EGD);  Surgeon: Josefine Class, MD;  Location: [facility] ENDOSCOPY;  Service: Endoscopy;  Laterality: N/A;  . TONSILLECTOMY AND ADENOIDECTOMY    . TRIGGER FINGER RELEASE  2004    Family Psychiatric History: Reviewed family psychiatric history from my progress note on 09/05/2017  Family History:  Family History  Problem Relation Age of Onset  . Breast cancer Sister   . Stroke Father   . Emphysema Father   . Anxiety disorder Father   . Depression Father   . Anxiety disorder Brother     Social History: Reviewed social history from my progress note on 09/05/2017 Social History   Socioeconomic History  . Marital status: Widowed    Spouse name: Not on file  . Number of children: 2  . Years of education: Not on file  . Highest education level: 12th grade  Occupational History  . 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  Financial resource strain: Not hard at all  . Food insecurity:    Worry: Never true    Inability: Never true  . Transportation needs:    Medical: No    Non-medical: No  Tobacco Use  . Smoking status: Former Smoker    Years: 30.00  . Smokeless tobacco: Never Used  . Tobacco comment: quit in 1989  Substance and Sexual Activity  . Alcohol use: No    Alcohol/week: 0.0 standard drinks  . Drug use: No  . Sexual activity: Not Currently  Lifestyle  . Physical activity:    Days per week: 0 days    Minutes per session: 0 min  . Stress: Only a little  Relationships  . Social connections:    Talks on phone: Not on file    Gets together: Not on file    Attends religious service: Not on file    Active member of club or organization: Not on file    Attends meetings of clubs or organizations: Not on file    Relationship status: Not on file  Other Topics Concern  . Not on file  Social History Narrative   Lives at home with her daughter.  Ambulates with a walker at baseline    Allergies:  Allergies  Allergen Reactions  . Arthrotec [Diclofenac-Misoprostol] Hives  . Codeine Other (See Comments)    Headache   . Diclofenac Other (See Comments) and Hives    Pt unsure allergy  . Hydrochlorothiazide Other (See Comments)    Weakness   . Penicillins Hives    Has patient had a PCN reaction causing immediate rash, facial/tongue/throat swelling, SOB or lightheadedness with hypotension: No Has patient had a PCN reaction causing severe rash involving mucus membranes or skin necrosis: No Has patient had a PCN reaction that required hospitalization: No Has patient had a PCN reaction occurring within the last 10 years: No If all of the above answers are "NO", then may proceed with Cephalosporin use.  . Sulfa Antibiotics Hives  . Tiotropium Bromide Monohydrate Other (See Comments)    Blurry vision   . Tylenol [Acetaminophen] Other (See Comments)    Doesn't work  . Morphine Hives and Rash  .  Pantoprazole Rash    Metabolic Disorder Labs: Lab Results  Component Value Date   HGBA1C 5.1 09/10/2015   No results found for: PROLACTIN Lab Results  Component Value Date   CHOL 207 (H) 08/20/2016   TRIG 66 08/20/2016   HDL 80 08/20/2016   CHOLHDL 2.6 08/20/2016   VLDL 12 11/01/2012   LDLCALC 114 (H) 08/20/2016   LDLCALC 132 03/27/2014   Lab Results  Component Value Date   TSH 0.771 12/03/2017   TSH 1.303 05/12/2017    Therapeutic Level Labs: No results found for: LITHIUM Lab Results  Component Value Date   VALPROATE 43 (L) 12/07/2017  No components found for:  CBMZ  Current Medications: Current Outpatient Medications  Medication Sig Dispense Refill  . albuterol (PROVENTIL) (2.5 MG/3ML) 0.083% nebulizer solution Take 3 mLs (2.5 mg total) by nebulization 2 (two) times daily as needed for wheezing or shortness of breath. 75 mL 5  . aspirin EC 325 MG tablet Take 1 tablet (325 mg total) by mouth daily. 30 tablet 2  . benazepril (LOTENSIN) 5 MG tablet TAKE 1 TABLET(5 MG) BY MOUTH DAILY 30 tablet 11  . busPIRone (BUSPAR) 10 MG tablet Take 1 tablet (10 mg total) by mouth 3 (three) times daily. 270 tablet 0  . Calcium Carbonate-Vitamin D (CALCIUM 600+D) 600-400 MG-UNIT tablet Take 1 tablet by mouth 3 (three) times daily.     . Cranberry 250 MG CAPS Take 1 capsule by mouth daily.     . divalproex (DEPAKOTE) 250 MG DR tablet Take 1 tablet (250 mg total) by mouth 2 (two) times daily. 180 tablet 1  . fluticasone (FLONASE) 50 MCG/ACT nasal spray Place 1 spray into both nostrils daily.    . Fluticasone-Salmeterol (WIXELA INHUB) 250-50 MCG/DOSE AEPB Inhale 1 puff into the lungs 2 (two) times daily.    . Fluticasone-Umeclidin-Vilant (TRELEGY ELLIPTA) 100-62.5-25 MCG/INH AEPB Inhale 1 puff into the lungs daily. 1 each 2  . gabapentin (NEURONTIN) 100 MG capsule TAKE 1 CAPSULE(100 MG) BY MOUTH TWICE DAILY 60 capsule 11  . gabapentin (NEURONTIN) 300 MG capsule TAKE 2 CAPSULES(600 MG) BY  MOUTH AT BEDTIME 60 capsule 11  . hydrOXYzine (ATARAX/VISTARIL) 25 MG tablet TAKE 1 TABLET(25 MG) BY MOUTH EVERY 8 HOURS AS NEEDED FOR ANXIETY 60 tablet 5  . levofloxacin (LEVAQUIN) 750 MG tablet Take 1 tablet (750 mg total) by mouth daily. 5 tablet 0  . loratadine (CLARITIN) 10 MG tablet Take 10 mg by mouth daily.    . magnesium oxide (MAG-OX) 400 MG tablet Take 400 mg by mouth 2 (two) times daily.    . Melatonin 10 MG CAPS Take 10 mg by mouth at bedtime as needed.    . meloxicam (MOBIC) 15 MG tablet TAKE 1 TABLET BY MOUTH EVERY DAY WITH A MEALS    . Misc Natural Products (OSTEO BI-FLEX ADV DOUBLE ST PO) Take 1 tablet by mouth 2 (two) times daily.     . montelukast (SINGULAIR) 10 MG tablet TAKE 1 TABLET(10 MG) BY MOUTH EVERY NIGHT 90 tablet 4  . Multiple Vitamin (MULTIVITAMIN WITH MINERALS) TABS tablet Take 1 tablet by mouth daily.    . Omega-3 Fatty Acids (FISH OIL) 1000 MG CAPS Take 1,000 mg by mouth daily.     [name] Kitchen oxybutynin (DITROPAN-XL) 10 MG 24 hr tablet TAKE 1 TABLET BY MOUTH AT BEDTIME 30 tablet 5  . risperiDONE (RISPERDAL) 1 MG tablet Take 1 tablet (1 mg total) by mouth at bedtime. 90 tablet 1  . theophylline (THEO-24) 200 MG 24 hr capsule Take 1 capsule (200 mg total) by mouth daily. 30 capsule 5  . tiZANidine (ZANAFLEX) 4 MG tablet Take 1 tablet (4 mg total) by mouth every 8 (eight) hours as needed for muscle spasms. 30 tablet 5  . traMADol (ULTRAM) 50 MG tablet TAKE 2 TABLETS BY MOUTH TWICE DAILY 60 tablet 0  . furosemide (LASIX) 20 MG tablet Take 1 tablet (20 mg total) by mouth daily. 30 tablet 0  . traZODone (DESYREL) 100 MG tablet Take 1-2 tablets (100-200 mg total) by mouth at bedtime. 180 tablet 1   No current facility-administered medications for this visit.  Musculoskeletal: Strength & Muscle Tone: within normal limits Gait & Station: in wheelchair Patient leans: N/A  Psychiatric Specialty Exam: Review of Systems  Psychiatric/Behavioral: The patient is  nervous/anxious and has insomnia.   All other systems reviewed and are negative.   Blood pressure 139/66, pulse 65.There is no height or weight on file to calculate BMI.  General Appearance: Casual  Eye Contact:  Fair  Speech:  Normal Rate  Volume:  Normal  Mood:  Anxious  Affect:  Appropriate  Thought Process:  Goal Directed and Descriptions of Associations: Intact  Orientation:  Full (Time, Place, and Person)  Thought Content: Logical   Suicidal Thoughts:  No  Homicidal Thoughts:  No  Memory:  Immediate;   Fair Recent;   Fair Remote;   Fair  Judgement:  Fair  Insight:  Fair  Psychomotor Activity:  Normal  Concentration:  Concentration: Fair and Attention Span: Fair  Recall:  [employer] of Knowledge: Fair  Language: Fair  Akathisia:  No  Handed:  Right  AIMS (if indicated): 0  Assets:  Communication Skills Desire for Improvement Social Support  ADL's:  Intact  Cognition: WNL  Sleep:  Poor   Screenings: PHQ2-9     Clinical Support from 09/20/2017 in [place] from 08/20/2016 in [place] Visit from 03/18/2015 in [place]  PHQ-2 Total Score  1  0  0  PHQ-9 Total Score  7  7  3        Assessment and Plan: Andilynn is a 77 year old Caucasian female who has a history of schizophrenia, depression, presented to the clinic today for a follow-up visit.  Patient continues to struggle with racing thoughts, anxiety symptoms as well as sleep issues.  Patient is currently recovering from pneumonia and was recently discharged from rehabilitation facility.  Patient continues to struggle with some shortness of breath especially on exertion.  We will continue to make medication changes as noted below.  Plan MDD  Continue BuSpar 10 mg p.o. 3 times daily  For panic attacks Continue BuSpar 10 mg 3 times daily  For schizophrenia Continue risperidone at a reduced dose of 1 mg p.o. nightly Continue Depakote 250 mg  p.o. twice daily Depakote level was reviewed-43 on 12/07/2017-subtherapeutic however will not make any changes today.  For insomnia Increase trazodone to 100-200 mg p.o. nightly Discussed sleep hygiene techniques Discussed with patient as well as daughter to call writer back in a week if she continues to have sleep issues.  Follow-up in clinic in 3 weeks or sooner if needed  More than 50 % of the time was spent for psychoeducation and supportive psychotherapy and care coordination.  This note was generated in part or whole with voice recognition software. Voice recognition is usually quite accurate but there are transcription errors that can and very often do occur. I apologize for any typographical errors that were not detected and corrected.        Ursula Alert, MD 02/07/2018, 5:21 PM

## 2018-02-07 NOTE — Telephone Encounter (Signed)
Patient was notified of results. Expressed understanding.  

## 2018-02-09 ENCOUNTER — Emergency Department
Admission: EM | Admit: 2018-02-09 | Discharge: 2018-02-09 | Disposition: A | Discharge status: Home / Self Care | Payer: Medicare HMO | Attending: Student in an Organized Health Care Education/Training Program | Admitting: Student in an Organized Health Care Education/Training Program

## 2018-02-09 ENCOUNTER — Other Ambulatory Visit: Payer: Self-pay

## 2018-02-09 ENCOUNTER — Ambulatory Visit: Payer: Self-pay | Admitting: Podiatry

## 2018-02-09 ENCOUNTER — Encounter: Payer: Self-pay | Admitting: Emergency Medicine

## 2018-02-09 ENCOUNTER — Emergency Department: Payer: Medicare HMO

## 2018-02-09 DIAGNOSIS — Z87891 Personal history of nicotine dependence: Secondary | ICD-10-CM | POA: Diagnosis not present

## 2018-02-09 DIAGNOSIS — K921 Melena: Secondary | ICD-10-CM | POA: Diagnosis not present

## 2018-02-09 DIAGNOSIS — Z79899 Other long term (current) drug therapy: Secondary | ICD-10-CM | POA: Insufficient documentation

## 2018-02-09 DIAGNOSIS — Z7982 Long term (current) use of aspirin: Secondary | ICD-10-CM | POA: Insufficient documentation

## 2018-02-09 DIAGNOSIS — R1110 Vomiting, unspecified: Secondary | ICD-10-CM | POA: Diagnosis not present

## 2018-02-09 DIAGNOSIS — R1012 Left upper quadrant pain: Secondary | ICD-10-CM | POA: Diagnosis present

## 2018-02-09 DIAGNOSIS — J449 Chronic obstructive pulmonary disease, unspecified: Secondary | ICD-10-CM | POA: Diagnosis not present

## 2018-02-09 DIAGNOSIS — I10 Essential (primary) hypertension: Secondary | ICD-10-CM | POA: Insufficient documentation

## 2018-02-09 DIAGNOSIS — K5732 Diverticulitis of large intestine without perforation or abscess without bleeding: Secondary | ICD-10-CM | POA: Diagnosis not present

## 2018-02-09 DIAGNOSIS — R531 Weakness: Secondary | ICD-10-CM | POA: Diagnosis not present

## 2018-02-09 DIAGNOSIS — R109 Unspecified abdominal pain: Secondary | ICD-10-CM | POA: Diagnosis not present

## 2018-02-09 LAB — COMPREHENSIVE METABOLIC PANEL
ALT: 12 U/L (ref 0–44)
AST: 14 U/L — AB (ref 15–41)
Albumin: 3.6 g/dL (ref 3.5–5.0)
Alkaline Phosphatase: 56 U/L (ref 38–126)
Anion gap: 7 (ref 5–15)
BILIRUBIN TOTAL: 0.8 mg/dL (ref 0.3–1.2)
BUN: 17 mg/dL (ref 8–23)
CALCIUM: 9.1 mg/dL (ref 8.9–10.3)
CO2: 29 mmol/L (ref 22–32)
Chloride: 97 mmol/L — ABNORMAL LOW (ref 98–111)
Creatinine, Ser: 0.75 mg/dL (ref 0.44–1.00)
GFR calc Af Amer: >60 mL/min (ref >60)
Glucose, Bld: 100 mg/dL — ABNORMAL HIGH (ref 70–99)
POTASSIUM: 4.1 mmol/L (ref 3.5–5.1)
Sodium: 133 mmol/L — ABNORMAL LOW (ref 135–145)
Total Protein: 5.8 g/dL — ABNORMAL LOW (ref 6.5–8.1)

## 2018-02-09 LAB — LIPASE, BLOOD: Lipase: 18 U/L (ref 11–51)

## 2018-02-09 LAB — URINALYSIS, COMPLETE (UACMP) WITH MICROSCOPIC
Bacteria, UA: NONE SEEN
Bilirubin Urine: NEGATIVE
Glucose, UA: NEGATIVE mg/dL
Hgb urine dipstick: NEGATIVE
Ketones, ur: NEGATIVE mg/dL
NITRITE: NEGATIVE
PROTEIN: NEGATIVE mg/dL
SPECIFIC GRAVITY, URINE: 1.006 (ref 1.005–1.030)
pH: 6 (ref 5.0–8.0)

## 2018-02-09 LAB — TROPONIN I

## 2018-02-09 LAB — CBC
HEMATOCRIT: 42.3 % (ref 35.0–47.0)
Hemoglobin: 13.7 g/dL (ref 12.0–16.0)
MCH: 30.4 pg (ref 26.0–34.0)
MCHC: 32.4 g/dL (ref 32.0–36.0)
MCV: 93.9 fL (ref 80.0–100.0)
Platelets: 188 10*3/uL (ref 150–440)
RBC: 4.5 MIL/uL (ref 3.80–5.20)
RDW: 14.7 % — AB (ref 11.5–14.5)
WBC: 5.8 10*3/uL (ref 3.6–11.0)

## 2018-02-09 MED ORDER — ONDANSETRON HCL 4 MG PO TABS: 4.0000 mg | ORAL_TABLET | Freq: Every day | ORAL | 0 refills | Status: DC | PRN

## 2018-02-09 MED ORDER — METRONIDAZOLE 500 MG PO TABS
500.0000 mg | ORAL_TABLET | Freq: Once | ORAL | Status: AC
  Administered 2018-02-09: 500 mg via ORAL
  Filled 2018-02-09: qty 1

## 2018-02-09 MED ORDER — ONDANSETRON HCL 4 MG/2ML IJ SOLN
4.0000 mg | Freq: Once | INTRAMUSCULAR | Status: AC | PRN
  Administered 2018-02-09: 4 mg via INTRAVENOUS
  Filled 2018-02-09: qty 2

## 2018-02-09 MED ORDER — CIPROFLOXACIN HCL 500 MG PO TABS
500.0000 mg | ORAL_TABLET | Freq: Once | ORAL | Status: AC
  Administered 2018-02-09: 500 mg via ORAL
  Filled 2018-02-09: qty 1

## 2018-02-09 MED ORDER — PROBIOTIC 250 MG PO CAPS: 1.0000 | ORAL_CAPSULE | Freq: Two times a day (BID) | ORAL | 0 refills | Status: DC | PRN

## 2018-02-09 MED ORDER — IOPAMIDOL (ISOVUE-300) INJECTION 61%
100.0000 mL | Freq: Once | INTRAVENOUS | Status: AC | PRN
  Administered 2018-02-09: 100 mL via INTRAVENOUS

## 2018-02-09 MED ORDER — PROMETHAZINE HCL 25 MG/ML IJ SOLN: 12.5000 mg | Freq: Four times a day (QID) | INTRAMUSCULAR | Status: DC | PRN

## 2018-02-09 MED ORDER — CLINDAMYCIN HCL 150 MG PO CAPS: 300.0000 mg | ORAL_CAPSULE | Freq: Three times a day (TID) | ORAL | 0 refills | Status: DC

## 2018-02-09 MED ORDER — SODIUM CHLORIDE 0.9 % IV BOLUS
500.0000 mL | Freq: Once | INTRAVENOUS | Status: AC
  Administered 2018-02-09: 500 mL via INTRAVENOUS

## 2018-02-09 NOTE — ED Triage Notes (Signed)
Patient reports abdominal pain and bloating and bright orange, loose stool for several weeks. Reports taking immodium for diarrhea with improvement of symptoms. Patient also reports dizziness and fatigue for several weeks. Denies fevers. Also reports decreased appetite.

## 2018-02-09 NOTE — ED Notes (Signed)
Patient transported to CT 

## 2018-02-09 NOTE — ED Notes (Signed)
ED Provider at bedside. 

## 2018-02-09 NOTE — ED Notes (Signed)
Pt reports mid-left sided abdominal "discomfort." Pt denies any pain and insists that it is more of a discomfort. States it has been getting worse x 2-3 weeks. Had diarrhea that she describes as "orange" up until 2 days ago, when she was instructed by PCP to take imodium. Has taken it with success. Stool no longer runny but pt still describes it as "orange" although now soft but formed.

## 2018-02-09 NOTE — ED Provider Notes (Signed)
[facility] Emergency Department Provider Note    First MD Initiated Contact with Patient 02/09/18 1505     (approximate)  I have reviewed the triage vital signs and the nursing notes.   HISTORY  Chief Complaint Abdominal Pain; Diarrhea; and Dizziness    HPI Vicki Morales is a 77 y.o. female below listed past medical history presents the ER with over 3 weeks of crampy abdominal pain as well as abdominal bloating associated with orange-colored loose diarrhea.  Patient was recently treated for pneumonia and was on antibiotics.  Patient does have extensive psychiatric history but denies any new medication changes.  Denies any pain at this moment but came to the ER patient was feeling weak and lightheaded due to the diarrhea.  Has not had any since being in the ER.  Is complaining of nausea.  No chest pain or shortness of breath.  Is never had issues like this before.    Past Medical History:  Diagnosis Date  . Anxiety   . Arthritis   . Cataract    right eye  . COPD (chronic obstructive pulmonary disease) ([place])   . Depression   . Dyspnea    DOE  . Dysrhythmia   . Edema    FEET/ANKLES  . GERD (gastroesophageal reflux disease)   . H/O wheezing   . History of hiatal hernia   . HOH (hard of hearing)   . Hypertension   . Incontinence of urine in female   . Orthopnea   . Pain    CHRONIC KNEE  . Pain    CHRONIC KNEE  . Paranoid disorder ([place])   . RLS (restless legs syndrome)   . Tremors of nervous system    HANDS   Family History  Problem Relation Age of Onset  . Breast cancer Sister   . Stroke Father   . Emphysema Father   . Anxiety disorder Father   . Depression Father   . Anxiety disorder Brother    Past Surgical History:  Procedure Laterality Date  . ABDOMINAL HYSTERECTOMY  1988  . APPENDECTOMY  1988  . CARDIAC CATHETERIZATION    . CATARACT EXTRACTION W/PHACO Right 07/14/2016   Procedure: CATARACT EXTRACTION PHACO AND INTRAOCULAR LENS  PLACEMENT ([place]) anterior vitrectomy;  Surgeon: Estill Cotta, MD;  Location: [facility] ORS;  Service: Ophthalmology;  Laterality: Right;  [place] 02:40 AP% 24.4 CDE 59.98 Fluid pack # [id number]  . CATARACT EXTRACTION W/PHACO Left 02/16/2017   Procedure: CATARACT EXTRACTION PHACO AND INTRAOCULAR LENS PLACEMENT (IOC);  Surgeon: Estill Cotta, MD;  Location: [facility] ORS;  Service: Ophthalmology;  Laterality: Left;  Lot # [id number] H [place]: 01:11.8 AP%: 23.5 CDE: 32.09   . ESOPHAGOGASTRODUODENOSCOPY N/A 11/11/2014   Procedure: ESOPHAGOGASTRODUODENOSCOPY (EGD);  Surgeon: Josefine Class, MD;  Location: [facility] ENDOSCOPY;  Service: Endoscopy;  Laterality: N/A;  . TONSILLECTOMY AND ADENOIDECTOMY    . TRIGGER FINGER RELEASE  2004   Patient Active Problem List   Diagnosis Date Noted  . Diastolic dysfunction 81/85/6314  . Sepsis ([place]) 12/03/2017  . Schizophrenia, undifferentiated ([place]) 11/24/2017  . COPD with acute exacerbation ([place]) 10/24/2017  . Nocturnal hypoxia 08/21/2016  . Restless leg syndrome 08/20/2016  . Impingement syndrome of shoulder region 07/29/2016  . Hyponatremia 03/21/2016  . Pneumonia 03/18/2016  . History of suicide attempt 03/16/2016  . Overdose 03/16/2016  . Pulmonary hypertension ([place]) 11/11/2015  . Anxiety 09/22/2015  . Depression 09/22/2015  . Osteoarthritis 09/10/2015  . Rosacea 07/03/2015  . Breast pain 11/07/2014  .  Callus of foot 11/07/2014  . CN (constipation) 11/07/2014  . Dermatitis, eczematoid 11/07/2014  . Diverticulosis of colon 11/07/2014  . Dizziness 11/07/2014  . Can't get food down 11/07/2014  . Accumulation of fluid in tissues 11/07/2014  . Fatigue 11/07/2014  . FOM (frequency of micturition) 11/07/2014  . Tension type headache 11/07/2014  . LBP (low back pain) 11/07/2014  . Episodic mood disorder ([place]) 11/07/2014  . Extreme obesity 11/07/2014  . Muscle ache 11/07/2014  . Disturbance of skin sensation 11/07/2014  . Awareness of heartbeats 11/07/2014    . Jerking 11/07/2014  . Body tinea 11/07/2014  . Essential (primary) hypertension 10/10/2014  . Moderate COPD (chronic obstructive pulmonary disease) ([place]) 04/01/2014  . Hx of obesity 04/01/2014  . CAFL (chronic airflow limitation) ([place]) 01/25/2009  . Malaise and fatigue 11/26/2008  . Aphasia 09/26/2008  . 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 GERD (gastroesophageal reflux disease) 06/07/1998  . HLD (hyperlipidemia) 06/07/1998  . OP (osteoporosis) 06/07/1998  . H/O total hysterectomy 03/08/1987  . Schizophrenia, in remission ([place]) 06/07/1978      Prior to Admission medications   Medication Sig Start Date End Date Taking? Authorizing Provider  albuterol (PROVENTIL) (2.5 MG/3ML) 0.083% nebulizer solution Take 3 mLs (2.5 mg total) by nebulization 2 (two) times daily as needed for wheezing or shortness of breath. 09/22/16   Birdie Sons, MD  aspirin EC 325 MG tablet Take 1 tablet (325 mg total) by mouth daily. 09/23/15   Demetrios Loll, MD  benazepril (LOTENSIN) 5 MG tablet TAKE 1 TABLET(5 MG) BY MOUTH DAILY 11/20/17   Birdie Sons, MD  busPIRone (BUSPAR) 10 MG tablet Take 1 tablet (10 mg total) by mouth 3 (three) times daily. 12/09/17   Ursula Alert, MD  Calcium Carbonate-Vitamin D (CALCIUM 600+D) 600-400 MG-UNIT tablet Take 1 tablet by mouth 3 (three) times daily.     [provider]  clindamycin (CLEOCIN) 150 MG capsule Take 2 capsules (300 mg total) by mouth 3 (three) times daily for 7 days. 02/09/18 02/16/18  Merlyn Lot, MD  Cranberry 250 MG CAPS Take 1 capsule by mouth daily.     [provider]  divalproex (DEPAKOTE) 250 MG DR tablet Take 1 tablet (250 mg total) by mouth 2 (two) times daily. 12/09/17   Ursula Alert, MD  fluticasone (FLONASE) 50 MCG/ACT nasal spray Place 1 spray into both nostrils daily.    [provider]  Fluticasone-Salmeterol (WIXELA INHUB) 250-50 MCG/DOSE AEPB Inhale 1 puff into the lungs 2 (two) times  daily.    [provider]  Fluticasone-Umeclidin-Vilant (TRELEGY ELLIPTA) 100-62.5-25 MCG/INH AEPB Inhale 1 puff into the lungs daily. 11/08/17   Birdie Sons, MD  furosemide (LASIX) 20 MG tablet Take 1 tablet (20 mg total) by mouth daily. 12/06/17 01/05/18  Hillary Bow, MD  gabapentin (NEURONTIN) 100 MG capsule TAKE 1 CAPSULE(100 MG) BY MOUTH TWICE DAILY 04/26/17   Birdie Sons, MD  gabapentin (NEURONTIN) 300 MG capsule TAKE 2 CAPSULES(600 MG) BY MOUTH AT BEDTIME 04/26/17   Birdie Sons, MD  hydrOXYzine (ATARAX/VISTARIL) 25 MG tablet TAKE 1 TABLET(25 MG) BY MOUTH EVERY 8 HOURS AS NEEDED FOR ANXIETY 05/08/17   Birdie Sons, MD  levofloxacin (LEVAQUIN) 750 MG tablet Take 1 tablet (750 mg total) by mouth daily. 12/06/17   Hillary Bow, MD  loratadine (CLARITIN) 10 MG tablet Take 10 mg by mouth daily.    [provider]  magnesium oxide (MAG-OX) 400 MG tablet Take 400  mg by mouth 2 (two) times daily.    [provider]  Melatonin 10 MG CAPS Take 10 mg by mouth at bedtime as needed.    [provider]  meloxicam (MOBIC) 15 MG tablet TAKE 1 TABLET BY MOUTH EVERY DAY WITH A MEALS    [provider]  Misc Natural Products (OSTEO BI-FLEX ADV DOUBLE ST PO) Take 1 tablet by mouth 2 (two) times daily.     [provider]  montelukast (SINGULAIR) 10 MG tablet TAKE 1 TABLET(10 MG) BY MOUTH EVERY NIGHT 01/29/18   Birdie Sons, MD  Multiple Vitamin (MULTIVITAMIN WITH MINERALS) TABS tablet Take 1 tablet by mouth daily.    [provider]  Omega-3 Fatty Acids (FISH OIL) 1000 MG CAPS Take 1,000 mg by mouth daily.     [provider]  ondansetron (ZOFRAN) 4 MG tablet Take 1 tablet (4 mg total) by mouth daily as needed for nausea or vomiting. 02/09/18 02/09/19  Merlyn Lot, MD  oxybutynin (DITROPAN-XL) 10 MG 24 hr tablet TAKE 1 TABLET BY MOUTH AT BEDTIME 08/21/17   Birdie Sons, MD  risperiDONE (RISPERDAL) 1 MG tablet Take 1  tablet (1 mg total) by mouth at bedtime. 12/09/17   Ursula Alert, MD  Saccharomyces boulardii (PROBIOTIC) 250 MG CAPS Take 1 capsule by mouth 3 times/day as needed-between meals & bedtime. 02/09/18   Merlyn Lot, MD  theophylline (THEO-24) 200 MG 24 hr capsule Take 1 capsule (200 mg total) by mouth daily. 12/07/17   Birdie Sons, MD  tiZANidine (ZANAFLEX) 4 MG tablet Take 1 tablet (4 mg total) by mouth every 8 (eight) hours as needed for muscle spasms. 06/17/17   Birdie Sons, MD  traMADol Veatrice Bourbon) 50 MG tablet TAKE 2 TABLETS BY MOUTH TWICE DAILY 12/04/17   Birdie Sons, MD  traZODone (DESYREL) 100 MG tablet Take 1-2 tablets (100-200 mg total) by mouth at bedtime. 02/07/18   Ursula Alert, MD    Allergies Arthrotec [diclofenac-misoprostol]; Codeine; Diclofenac; Hydrochlorothiazide; Penicillins; Sulfa antibiotics; Tiotropium bromide monohydrate; Tylenol [acetaminophen]; Morphine; and Pantoprazole    Social History Social History   Tobacco Use  . Smoking status: Former Smoker    Years: 30.00  . Smokeless tobacco: Never Used  . Tobacco comment: quit in 1989  Substance Use Topics  . Alcohol use: No    Alcohol/week: 0.0 standard drinks  . Drug use: No    Review of Systems Patient denies headaches, rhinorrhea, blurry vision, numbness, shortness of breath, chest pain, edema, cough, abdominal pain, nausea, vomiting, diarrhea, dysuria, fevers, rashes or hallucinations unless otherwise stated above in HPI. ____________________________________________   PHYSICAL EXAM:  VITAL SIGNS: Vitals:   02/09/18 1630 02/09/18 1750  BP: (!) 155/74   Pulse: (!) 55 60  Resp:    Temp:    SpO2: 94% 92%    Constitutional: Alert and oriented.  Eyes: Conjunctivae are normal.  Head: Atraumatic. Nose: No congestion/rhinnorhea. Mouth/Throat: Mucous membranes are moist.   Neck: No stridor. Painless ROM.  Cardiovascular: Normal rate, regular rhythm. Grossly normal heart sounds.  Good  peripheral circulation. Respiratory: Normal respiratory effort.  No retractions. Lungs CTAB. Gastrointestinal: Soft and nontender. Mild distention, more subjective to the patient than objective distension. No abdominal bruits. No CVA tenderness. Genitourinary:  Musculoskeletal: No lower extremity tenderness nor edema.  No joint effusions. Neurologic:  Normal speech and language. No gross focal neurologic deficits are appreciated. No facial droop Skin:  Skin is warm, dry and intact. No rash noted.  Psychiatric: Mood and affect are normal. Speech and behavior are normal.  ____________________________________________   LABS (all labs ordered are listed, but only abnormal results are displayed)  Results for orders placed or performed during the hospital encounter of 02/09/18 (from the past 24 hour(s))  Lipase, blood     Status: None   Collection Time: 02/09/18 11:02 AM  Result Value Ref Range   Lipase 18 11 - 51 U/L  Comprehensive metabolic panel     Status: Abnormal   Collection Time: 02/09/18 11:02 AM  Result Value Ref Range   Sodium 133 (L) 135 - 145 mmol/L   Potassium 4.1 3.5 - 5.1 mmol/L   Chloride 97 (L) 98 - 111 mmol/L   CO2 29 22 - 32 mmol/L   Glucose, Bld 100 (H) 70 - 99 mg/dL   BUN 17 8 - 23 mg/dL   Creatinine, Ser 0.75 0.44 - 1.00 mg/dL   Calcium 9.1 8.9 - 10.3 mg/dL   Total Protein 5.8 (L) 6.5 - 8.1 g/dL   Albumin 3.6 3.5 - 5.0 g/dL   AST 14 (L) 15 - 41 U/L   ALT 12 0 - 44 U/L   Alkaline Phosphatase 56 38 - 126 U/L   Total Bilirubin 0.8 0.3 - 1.2 mg/dL   GFR calc non Af Amer >60 >60 mL/min   GFR calc Af Amer >60 >60 mL/min   Anion gap 7 5 - 15  CBC     Status: Abnormal   Collection Time: 02/09/18 11:02 AM  Result Value Ref Range   WBC 5.8 3.6 - 11.0 K/uL   RBC 4.50 3.80 - 5.20 MIL/uL   Hemoglobin 13.7 12.0 - 16.0 g/dL   HCT 42.3 35.0 - 47.0 %   MCV 93.9 80.0 - 100.0 fL   MCH 30.4 26.0 - 34.0 pg   MCHC 32.4 32.0 - 36.0 g/dL   RDW 14.7 (H) 11.5 - 14.5 %    Platelets 188 150 - 440 K/uL  Urinalysis, Complete w Microscopic     Status: Abnormal   Collection Time: 02/09/18 11:02 AM  Result Value Ref Range   Color, Urine YELLOW (A) YELLOW   APPearance CLEAR (A) CLEAR   Specific Gravity, Urine 1.006 1.005 - 1.030   pH 6.0 5.0 - 8.0   Glucose, UA NEGATIVE NEGATIVE mg/dL   Hgb urine dipstick NEGATIVE NEGATIVE   Bilirubin Urine NEGATIVE NEGATIVE   Ketones, ur NEGATIVE NEGATIVE mg/dL   Protein, ur NEGATIVE NEGATIVE mg/dL   Nitrite NEGATIVE NEGATIVE   Leukocytes, UA SMALL (A) NEGATIVE   WBC, UA 0-5 0 - 5 WBC/hpf   Bacteria, UA NONE SEEN NONE SEEN   Squamous Epithelial / LPF 0-5 0 - 5   Mucus PRESENT   Troponin I     Status: None   Collection Time: 02/09/18 11:02 AM  Result Value Ref Range   Troponin I <0.03 <0.03 ng/mL   ____________________________________________  EKG My review and personal interpretation at Time: 11:07   Indication: weakness  Rate: 100  Rhythm: sinus Axis: normal Other: frequent pvc, no stemi, normal intercvals ____________________________________________  RADIOLOGY  I personally reviewed all radiographic images ordered to evaluate for the above acute complaints and reviewed radiology reports and findings.  These findings were personally discussed with the patient.  Please see medical record for radiology report.  ____________________________________________   PROCEDURES  Procedure(s) performed:  Procedures    Critical Care performed: no ____________________________________________   INITIAL IMPRESSION / ASSESSMENT AND PLAN / ED COURSE  Pertinent labs & imaging results that were available during my care of the patient were reviewed by me and considered in my medical decision making (see chart for details).   DDX: Dehydration, C. difficile, enteritis, functional diarrhea, medication effect, mass  Vicki Morales is a 77 y.o. who presents to the ED with was as described above.  Patient is AFVSS in ED. Exam  as above. Given current presentation have considered the above differential.  The patient will be placed on continuous pulse oximetry and telemetry for monitoring.  Laboratory evaluation will be sent to evaluate for the above complaints.   Will order her CT abdomen based on presentation.  If able we will send stool studies.  Clinical Course as of Feb 10 1951  Thu Feb 09, 2018  1610 CT imaging does show evidence of acute diverticulitis.  No evidence of perforation or abscess.  Symptoms seem to be very mild she has no significant white count no tachycardia no fever.  Patient will be given oral antibiotics.  Will trial oral hydration.  If able to provide stool sample we will send to get specimen otherwise this can be done as an outpatient.   [PR]  1642 Patient with intolerance to long-term ciprofloxacin as she is taking theophylline.  Also has penicillin allergy therefore do not want to give Augmentin.  Clindamycin does give adequate anaerobic coverage but does increase risk of C. Difficile.     [PR]    Clinical Course User Index [PR] Merlyn Lot, MD     As part of my medical decision making, I reviewed the following data within the [facility] notes reviewed and incorporated, Labs reviewed, notes from prior ED visits.   ____________________________________________   FINAL CLINICAL IMPRESSION(S) / ED DIAGNOSES  Final diagnoses:  Diverticulitis large intestine w/o perforation or abscess w/o bleeding      NEW MEDICATIONS STARTED DURING THIS VISIT:  Discharge Medication List as of 02/09/2018  4:46 PM    START taking these medications   Details  clindamycin (CLEOCIN) 150 MG capsule Take 2 capsules (300 mg total) by mouth 3 (three) times daily for 7 days., Starting Thu 02/09/2018, Until Thu 02/16/2018, Normal    ondansetron (ZOFRAN) 4 MG tablet Take 1 tablet (4 mg total) by mouth daily as needed for nausea or vomiting., Starting Thu 02/09/2018, Until Fri 02/09/2019,  Print    Saccharomyces boulardii (PROBIOTIC) 250 MG CAPS Take 1 capsule by mouth 3 times/day as needed-between meals & bedtime., Starting Thu 02/09/2018, Print         Note:  This document was prepared using Dragon voice recognition software and may include unintentional dictation errors.    Merlyn Lot, MD 02/09/18 [phone]

## 2018-02-09 NOTE — ED Notes (Signed)
Pt assisted to toilet. Pt unable to provide stool collection at this time. RN Apolonio Schneiders given report and informed of needed tests.

## 2018-02-09 NOTE — ED Notes (Signed)
Verbally acknowledged understanding of need for f/u, when to return, and receipt of printed dc instructions and rx. esi ng not working.

## 2018-02-09 NOTE — Discharge Instructions (Signed)

## 2018-02-09 NOTE — ED Notes (Signed)
First Nurse Note: Patient to ED, Report called from [facility].  Patient has complaints of fatigue, dizziness, abdominal discomfort, and "orange stools".  Alert and oriented.

## 2018-02-10 ENCOUNTER — Encounter: Payer: Self-pay | Admitting: Family Medicine

## 2018-02-10 ENCOUNTER — Ambulatory Visit (INDEPENDENT_AMBULATORY_CARE_PROVIDER_SITE_OTHER): Payer: Medicare HMO | Admitting: Family Medicine

## 2018-02-10 ENCOUNTER — Telehealth: Payer: Self-pay | Admitting: Family Medicine

## 2018-02-10 VITALS — BP 112/62 | HR 100 | Temp 98.6°F | Resp 20

## 2018-02-10 DIAGNOSIS — J449 Chronic obstructive pulmonary disease, unspecified: Secondary | ICD-10-CM | POA: Diagnosis not present

## 2018-02-10 DIAGNOSIS — I10 Essential (primary) hypertension: Secondary | ICD-10-CM | POA: Diagnosis not present

## 2018-02-10 DIAGNOSIS — K5792 Diverticulitis of intestine, part unspecified, without perforation or abscess without bleeding: Secondary | ICD-10-CM

## 2018-02-10 DIAGNOSIS — M545 Low back pain: Secondary | ICD-10-CM | POA: Diagnosis not present

## 2018-02-10 DIAGNOSIS — F329 Major depressive disorder, single episode, unspecified: Secondary | ICD-10-CM | POA: Diagnosis not present

## 2018-02-10 DIAGNOSIS — I272 Pulmonary hypertension, unspecified: Secondary | ICD-10-CM | POA: Diagnosis not present

## 2018-02-10 DIAGNOSIS — R42 Dizziness and giddiness: Secondary | ICD-10-CM | POA: Diagnosis not present

## 2018-02-10 DIAGNOSIS — F419 Anxiety disorder, unspecified: Secondary | ICD-10-CM | POA: Diagnosis not present

## 2018-02-10 DIAGNOSIS — G8929 Other chronic pain: Secondary | ICD-10-CM | POA: Diagnosis not present

## 2018-02-10 DIAGNOSIS — F209 Schizophrenia, unspecified: Secondary | ICD-10-CM | POA: Diagnosis not present

## 2018-02-10 DIAGNOSIS — M7540 Impingement syndrome of unspecified shoulder: Secondary | ICD-10-CM | POA: Diagnosis not present

## 2018-02-10 MED ORDER — METRONIDAZOLE 500 MG PO TABS: 500.0000 mg | ORAL_TABLET | Freq: Two times a day (BID) | ORAL | 0 refills | Status: DC

## 2018-02-10 MED ORDER — CIPROFLOXACIN HCL 500 MG PO TABS: 250.0000 mg | ORAL_TABLET | Freq: Two times a day (BID) | ORAL | 0 refills | Status: AC

## 2018-02-10 NOTE — Telephone Encounter (Signed)
Advised Dr. Caryn Section as below. Per Dr. Caryn Section, the clindamycin was discontinued today. Hopefully her symptoms will resolve once being off of the medication. Start the abx that was prescribed, and if worsening come back in for an OV or go to the ER. Patient was advised and verbally understands.

## 2018-02-10 NOTE — Telephone Encounter (Signed)
Please review. Thanks!  

## 2018-02-10 NOTE — Patient Instructions (Signed)
   Stop taking clindamycin   Start taking Cipro 1/2 tablet twice every day for 7 days   Start taking metronidazole 1 tablet twice every day for 7 days.

## 2018-02-10 NOTE — Telephone Encounter (Signed)
Pt called saying the medication (she does not know the name of it) for diverticulitis makes her short of breath. She was given this medication in the hospital yesterday.  She wants to know if it should cause her to have SOB   Pt's CB# [phone]    thanks teri

## 2018-02-10 NOTE — Progress Notes (Signed)
Patient: Vicki Morales Female    DOB: 21-Sep-1940   77 y.o.   MRN: [id number] Visit Date: 02/10/2018  Today's Provider: Lelon Huh, MD   Chief Complaint  Patient presents with  . ER follow up  . Dizziness   Subjective:    HPI  Follow Up ER Visit  Patient is here for ER follow up.  She was recently seen at [facility] for abdominal pain on 02/09/18. Treatment for this included clindamycin 300mg  TID for diverticulitis.  She reports good compliance with treatment. She reports this condition is Unchanged.  Patient feels that the medication that clindamycin prescribed for her diverticulitis is making her dizzy. She reports that she feels very unsteady on her feet. However her daughter reports that patient's trazodone was also increased a few days ago.       Allergies  Allergen Reactions  . Arthrotec [Diclofenac-Misoprostol] Hives  . Codeine Other (See Comments)    Headache   . Diclofenac Other (See Comments) and Hives    Pt unsure allergy  . Hydrochlorothiazide Other (See Comments)    Weakness   . Penicillins Hives    Has patient had a PCN reaction causing immediate rash, facial/tongue/throat swelling, SOB or lightheadedness with hypotension: No Has patient had a PCN reaction causing severe rash involving mucus membranes or skin necrosis: No Has patient had a PCN reaction that required hospitalization: No Has patient had a PCN reaction occurring within the last 10 years: No If all of the above answers are "NO", then may proceed with Cephalosporin use.  . Sulfa Antibiotics Hives  . Tiotropium Bromide Monohydrate Other (See Comments)    Blurry vision   . Tylenol [Acetaminophen] Other (See Comments)    Doesn't work  . Morphine Hives and Rash  . Pantoprazole Rash     Current Outpatient Medications:  .  albuterol (PROVENTIL) (2.5 MG/3ML) 0.083% nebulizer solution, Take 3 mLs (2.5 mg total) by nebulization 2 (two) times daily as needed for wheezing or shortness of breath.,  Disp: 75 mL, Rfl: 5 .  aspirin EC 325 MG tablet, Take 1 tablet (325 mg total) by mouth daily., Disp: 30 tablet, Rfl: 2 .  benazepril (LOTENSIN) 5 MG tablet, TAKE 1 TABLET(5 MG) BY MOUTH DAILY, Disp: 30 tablet, Rfl: 11 .  busPIRone (BUSPAR) 10 MG tablet, Take 1 tablet (10 mg total) by mouth 3 (three) times daily., Disp: 270 tablet, Rfl: 0 .  Calcium Carbonate-Vitamin D (CALCIUM 600+D) 600-400 MG-UNIT tablet, Take 1 tablet by mouth 3 (three) times daily. , Disp: , Rfl:  .  clindamycin (CLEOCIN) 150 MG capsule, Take 2 capsules (300 mg total) by mouth 3 (three) times daily for 7 days., Disp: 30 capsule, Rfl: 0 .  Cranberry 250 MG CAPS, Take 1 capsule by mouth daily. , Disp: , Rfl:  .  divalproex (DEPAKOTE) 250 MG DR tablet, Take 1 tablet (250 mg total) by mouth 2 (two) times daily., Disp: 180 tablet, Rfl: 1 .  fluticasone (FLONASE) 50 MCG/ACT nasal spray, Place 1 spray into both nostrils daily., Disp: , Rfl:  .  Fluticasone-Salmeterol (WIXELA INHUB) 250-50 MCG/DOSE AEPB, Inhale 1 puff into the lungs 2 (two) times daily., Disp: , Rfl:  .  Fluticasone-Umeclidin-Vilant (TRELEGY ELLIPTA) 100-62.5-25 MCG/INH AEPB, Inhale 1 puff into the lungs daily., Disp: 1 each, Rfl: 2 .  gabapentin (NEURONTIN) 100 MG capsule, TAKE 1 CAPSULE(100 MG) BY MOUTH TWICE DAILY, Disp: 60 capsule, Rfl: 11 .  gabapentin (NEURONTIN) 300 MG capsule, TAKE  2 CAPSULES(600 MG) BY MOUTH AT BEDTIME, Disp: 60 capsule, Rfl: 11 .  hydrOXYzine (ATARAX/VISTARIL) 25 MG tablet, TAKE 1 TABLET(25 MG) BY MOUTH EVERY 8 HOURS AS NEEDED FOR ANXIETY, Disp: 60 tablet, Rfl: 5 .  loratadine (CLARITIN) 10 MG tablet, Take 10 mg by mouth daily., Disp: , Rfl:  .  magnesium oxide (MAG-OX) 400 MG tablet, Take 400 mg by mouth 2 (two) times daily., Disp: , Rfl:  .  Melatonin 10 MG CAPS, Take 10 mg by mouth at bedtime as needed., Disp: , Rfl:  .  meloxicam (MOBIC) 15 MG tablet, TAKE 1 TABLET BY MOUTH EVERY DAY WITH A MEALS, Disp: , Rfl:  .  Misc Natural Products  (OSTEO BI-FLEX ADV DOUBLE ST PO), Take 1 tablet by mouth 2 (two) times daily. , Disp: , Rfl:  .  montelukast (SINGULAIR) 10 MG tablet, TAKE 1 TABLET(10 MG) BY MOUTH EVERY NIGHT, Disp: 90 tablet, Rfl: 4 .  Multiple Vitamin (MULTIVITAMIN WITH MINERALS) TABS tablet, Take 1 tablet by mouth daily., Disp: , Rfl:  .  Omega-3 Fatty Acids (FISH OIL) 1000 MG CAPS, Take 1,000 mg by mouth daily. , Disp: , Rfl:  .  ondansetron (ZOFRAN) 4 MG tablet, Take 1 tablet (4 mg total) by mouth daily as needed for nausea or vomiting., Disp: 14 tablet, Rfl: 0 .  oxybutynin (DITROPAN-XL) 10 MG 24 hr tablet, TAKE 1 TABLET BY MOUTH AT BEDTIME, Disp: 30 tablet, Rfl: 5 .  risperiDONE (RISPERDAL) 1 MG tablet, Take 1 tablet (1 mg total) by mouth at bedtime., Disp: 90 tablet, Rfl: 1 .  Saccharomyces boulardii (PROBIOTIC) 250 MG CAPS, Take 1 capsule by mouth 3 times/day as needed-between meals & bedtime., Disp: 60 capsule, Rfl: 0 .  theophylline (THEO-24) 200 MG 24 hr capsule, Take 1 capsule (200 mg total) by mouth daily., Disp: 30 capsule, Rfl: 5 .  tiZANidine (ZANAFLEX) 4 MG tablet, Take 1 tablet (4 mg total) by mouth every 8 (eight) hours as needed for muscle spasms., Disp: 30 tablet, Rfl: 5 .  traMADol (ULTRAM) 50 MG tablet, TAKE 2 TABLETS BY MOUTH TWICE DAILY, Disp: 60 tablet, Rfl: 0 .  traZODone (DESYREL) 100 MG tablet, Take 1-2 tablets (100-200 mg total) by mouth at bedtime., Disp: 180 tablet, Rfl: 1 .  furosemide (LASIX) 20 MG tablet, Take 1 tablet (20 mg total) by mouth daily., Disp: 30 tablet, Rfl: 0 .  levofloxacin (LEVAQUIN) 750 MG tablet, Take 1 tablet (750 mg total) by mouth daily. (Patient not taking: Reported on 02/10/2018), Disp: 5 tablet, Rfl: 0  Review of Systems  Constitutional: Positive for activity change and fatigue.  Respiratory: Negative for cough.   Cardiovascular: Negative for chest pain, palpitations and leg swelling.  Gastrointestinal: Positive for abdominal pain and nausea. Negative for diarrhea, rectal  pain and vomiting.  Neurological: Positive for dizziness and weakness. Negative for tremors, light-headedness and headaches.    Social History   Tobacco Use  . Smoking status: Former Smoker    Years: 30.00  . Smokeless tobacco: Never Used  . Tobacco comment: quit in 1989  Substance Use Topics  . Alcohol use: No    Alcohol/week: 0.0 standard drinks   Objective:   BP 112/62 (BP Location: Right Arm, Patient Position: Sitting, Cuff Size: Large)   Pulse 100   Temp 98.6 F (37 C)   Resp 20  Vitals:   02/10/18 1526  BP: 112/62  Pulse: 100  Resp: 20  Temp: 98.6 F (37 C)  Physical Exam  General appearance: alert, well developed, well nourished, cooperative and in no distress Head: Normocephalic, without obvious abnormality, atraumatic Respiratory: Respirations even and unlabored, normal respiratory rate Extremities: No gross deformities Skin: Skin color, texture, turgor normal. No rashes seen  Psych: Appropriate mood and affect. Neurologic: Mental status: Alert, oriented to person, place, and time, thought content appropriate.     Assessment & Plan:     1. Dizziness Likely due to recent increase in trazodone dose, however patient is insistent she will not take any more of the clindamycin. Will go back down on dose of trazodone until acute illness has resolved and change clindamycin as below.   2. Diverticulitis Patient is not going to continue clindamycin as above. She has left over ciprofloxacin prescription which she only took one of because It made her feel bad. Will resume at 1/2 dose and add- metroNIDAZOLE (FLAGYL) 500 MG tablet; Take 1 tablet (500 mg total) by mouth 2 (two) times daily.  Dispense: 14 tablet; Refill: 0 - ciprofloxacin (CIPRO) 500 MG tablet; Take 0.5 tablets (250 mg total) by mouth 2 (two) times daily for 7 days.  Dispense: 1 tablet; Refill: 0  Call if symptoms change or if not rapidly improving.          Lelon Huh, MD  [facility]

## 2018-02-11 DIAGNOSIS — J449 Chronic obstructive pulmonary disease, unspecified: Secondary | ICD-10-CM | POA: Diagnosis not present

## 2018-02-11 DIAGNOSIS — M7540 Impingement syndrome of unspecified shoulder: Secondary | ICD-10-CM | POA: Diagnosis not present

## 2018-02-11 DIAGNOSIS — M545 Low back pain: Secondary | ICD-10-CM | POA: Diagnosis not present

## 2018-02-11 DIAGNOSIS — F209 Schizophrenia, unspecified: Secondary | ICD-10-CM | POA: Diagnosis not present

## 2018-02-11 DIAGNOSIS — I10 Essential (primary) hypertension: Secondary | ICD-10-CM | POA: Diagnosis not present

## 2018-02-11 DIAGNOSIS — I272 Pulmonary hypertension, unspecified: Secondary | ICD-10-CM | POA: Diagnosis not present

## 2018-02-11 DIAGNOSIS — F419 Anxiety disorder, unspecified: Secondary | ICD-10-CM | POA: Diagnosis not present

## 2018-02-11 DIAGNOSIS — G8929 Other chronic pain: Secondary | ICD-10-CM | POA: Diagnosis not present

## 2018-02-11 DIAGNOSIS — F329 Major depressive disorder, single episode, unspecified: Secondary | ICD-10-CM | POA: Diagnosis not present

## 2018-02-13 ENCOUNTER — Telehealth: Payer: Self-pay | Admitting: Family Medicine

## 2018-02-13 DIAGNOSIS — I272 Pulmonary hypertension, unspecified: Secondary | ICD-10-CM | POA: Diagnosis not present

## 2018-02-13 DIAGNOSIS — M545 Low back pain: Secondary | ICD-10-CM | POA: Diagnosis not present

## 2018-02-13 DIAGNOSIS — F329 Major depressive disorder, single episode, unspecified: Secondary | ICD-10-CM | POA: Diagnosis not present

## 2018-02-13 DIAGNOSIS — G8929 Other chronic pain: Secondary | ICD-10-CM | POA: Diagnosis not present

## 2018-02-13 DIAGNOSIS — F419 Anxiety disorder, unspecified: Secondary | ICD-10-CM | POA: Diagnosis not present

## 2018-02-13 DIAGNOSIS — M7540 Impingement syndrome of unspecified shoulder: Secondary | ICD-10-CM | POA: Diagnosis not present

## 2018-02-13 DIAGNOSIS — J449 Chronic obstructive pulmonary disease, unspecified: Secondary | ICD-10-CM | POA: Diagnosis not present

## 2018-02-13 DIAGNOSIS — I10 Essential (primary) hypertension: Secondary | ICD-10-CM | POA: Diagnosis not present

## 2018-02-13 DIAGNOSIS — F209 Schizophrenia, unspecified: Secondary | ICD-10-CM | POA: Diagnosis not present

## 2018-02-13 NOTE — Telephone Encounter (Signed)
Stephanie with [facility] called for OT orders 1 time a week for 1 week and  2 times for 4 weeks.  Verbal call back [phone]  Thanks  Con Memos

## 2018-02-13 NOTE — Telephone Encounter (Signed)
Pt wanted to let Dr. Caryn Section know that she went back to taking the medication the hospital sent in for her and she stopped taking the medication Dr. Caryn Section sent in. Pt stated she is only taking ondansetron (ZOFRAN) 4 MG tablet & Saccharomyces boulardii (PROBIOTIC) 250 MG CAPS. Please advise. Thanks TNP

## 2018-02-13 NOTE — Telephone Encounter (Signed)
That fine.

## 2018-02-13 NOTE — Telephone Encounter (Signed)
Please advise 

## 2018-02-14 DIAGNOSIS — I10 Essential (primary) hypertension: Secondary | ICD-10-CM | POA: Diagnosis not present

## 2018-02-14 DIAGNOSIS — G8929 Other chronic pain: Secondary | ICD-10-CM | POA: Diagnosis not present

## 2018-02-14 DIAGNOSIS — M7540 Impingement syndrome of unspecified shoulder: Secondary | ICD-10-CM | POA: Diagnosis not present

## 2018-02-14 DIAGNOSIS — F209 Schizophrenia, unspecified: Secondary | ICD-10-CM | POA: Diagnosis not present

## 2018-02-14 DIAGNOSIS — I272 Pulmonary hypertension, unspecified: Secondary | ICD-10-CM | POA: Diagnosis not present

## 2018-02-14 DIAGNOSIS — F419 Anxiety disorder, unspecified: Secondary | ICD-10-CM | POA: Diagnosis not present

## 2018-02-14 DIAGNOSIS — J449 Chronic obstructive pulmonary disease, unspecified: Secondary | ICD-10-CM | POA: Diagnosis not present

## 2018-02-14 DIAGNOSIS — M545 Low back pain: Secondary | ICD-10-CM | POA: Diagnosis not present

## 2018-02-14 DIAGNOSIS — F329 Major depressive disorder, single episode, unspecified: Secondary | ICD-10-CM | POA: Diagnosis not present

## 2018-02-14 NOTE — Telephone Encounter (Signed)
Stephanie advised.   Thanks,   -Tyrah Broers  

## 2018-02-16 ENCOUNTER — Telehealth: Payer: Self-pay | Admitting: Family Medicine

## 2018-02-16 ENCOUNTER — Ambulatory Visit: Payer: Self-pay | Admitting: Podiatry

## 2018-02-16 DIAGNOSIS — I272 Pulmonary hypertension, unspecified: Secondary | ICD-10-CM | POA: Diagnosis not present

## 2018-02-16 DIAGNOSIS — I10 Essential (primary) hypertension: Secondary | ICD-10-CM | POA: Diagnosis not present

## 2018-02-16 DIAGNOSIS — G8929 Other chronic pain: Secondary | ICD-10-CM | POA: Diagnosis not present

## 2018-02-16 DIAGNOSIS — F209 Schizophrenia, unspecified: Secondary | ICD-10-CM | POA: Diagnosis not present

## 2018-02-16 DIAGNOSIS — M545 Low back pain: Secondary | ICD-10-CM | POA: Diagnosis not present

## 2018-02-16 DIAGNOSIS — M7540 Impingement syndrome of unspecified shoulder: Secondary | ICD-10-CM | POA: Diagnosis not present

## 2018-02-16 DIAGNOSIS — F419 Anxiety disorder, unspecified: Secondary | ICD-10-CM | POA: Diagnosis not present

## 2018-02-16 DIAGNOSIS — F329 Major depressive disorder, single episode, unspecified: Secondary | ICD-10-CM | POA: Diagnosis not present

## 2018-02-16 DIAGNOSIS — J449 Chronic obstructive pulmonary disease, unspecified: Secondary | ICD-10-CM | POA: Diagnosis not present

## 2018-02-16 NOTE — Telephone Encounter (Signed)
Please advise 

## 2018-02-16 NOTE — Telephone Encounter (Signed)
That's fine

## 2018-02-16 NOTE — Telephone Encounter (Signed)
Vicki Morales with [facility] is requesting verbal orders for in home physical therapy as follows:  Twice a week for 4 weeks  Vicki Morales stated that if he isn't able to answer please leave detailed voicemail with verbal orders if possible. Please advise. Thanks TNP

## 2018-02-17 ENCOUNTER — Telehealth: Payer: Self-pay

## 2018-02-17 DIAGNOSIS — M545 Low back pain: Secondary | ICD-10-CM | POA: Diagnosis not present

## 2018-02-17 DIAGNOSIS — F419 Anxiety disorder, unspecified: Secondary | ICD-10-CM | POA: Diagnosis not present

## 2018-02-17 DIAGNOSIS — J449 Chronic obstructive pulmonary disease, unspecified: Secondary | ICD-10-CM | POA: Diagnosis not present

## 2018-02-17 DIAGNOSIS — I10 Essential (primary) hypertension: Secondary | ICD-10-CM | POA: Diagnosis not present

## 2018-02-17 DIAGNOSIS — G8929 Other chronic pain: Secondary | ICD-10-CM | POA: Diagnosis not present

## 2018-02-17 DIAGNOSIS — M7540 Impingement syndrome of unspecified shoulder: Secondary | ICD-10-CM | POA: Diagnosis not present

## 2018-02-17 DIAGNOSIS — I272 Pulmonary hypertension, unspecified: Secondary | ICD-10-CM | POA: Diagnosis not present

## 2018-02-17 DIAGNOSIS — F209 Schizophrenia, unspecified: Secondary | ICD-10-CM | POA: Diagnosis not present

## 2018-02-17 DIAGNOSIS — F329 Major depressive disorder, single episode, unspecified: Secondary | ICD-10-CM | POA: Diagnosis not present

## 2018-02-17 NOTE — Telephone Encounter (Signed)
Left detailed message on Aldrin's vm with verbal orders.

## 2018-02-17 NOTE — Telephone Encounter (Signed)
FYI  Patient was seen by the ER and treated for Diverticulitis.  She said they told her she had to call and let you know how she was doing this morning.  She is feeling a little better now and did not want to come in to the office today.  She  Was concerned that she may get worse again and would have to go to the ER but still would not come in today.

## 2018-02-19 ENCOUNTER — Other Ambulatory Visit: Payer: Self-pay

## 2018-02-19 ENCOUNTER — Inpatient Hospital Stay
Admission: EM | Admit: 2018-02-19 | Discharge: 2018-02-26 | DRG: 191 | Disposition: A | Discharge status: Skilled Nursing Facility | Payer: Medicare HMO | Attending: Specialist | Admitting: Specialist

## 2018-02-19 ENCOUNTER — Emergency Department: Payer: Medicare HMO

## 2018-02-19 ENCOUNTER — Encounter: Payer: Self-pay | Admitting: Emergency Medicine

## 2018-02-19 DIAGNOSIS — H9190 Unspecified hearing loss, unspecified ear: Secondary | ICD-10-CM | POA: Diagnosis present

## 2018-02-19 DIAGNOSIS — Z66 Do not resuscitate: Secondary | ICD-10-CM | POA: Diagnosis present

## 2018-02-19 DIAGNOSIS — E871 Hypo-osmolality and hyponatremia: Secondary | ICD-10-CM | POA: Diagnosis not present

## 2018-02-19 DIAGNOSIS — Z90710 Acquired absence of both cervix and uterus: Secondary | ICD-10-CM

## 2018-02-19 DIAGNOSIS — Z825 Family history of asthma and other chronic lower respiratory diseases: Secondary | ICD-10-CM

## 2018-02-19 DIAGNOSIS — R197 Diarrhea, unspecified: Secondary | ICD-10-CM | POA: Diagnosis not present

## 2018-02-19 DIAGNOSIS — I272 Pulmonary hypertension, unspecified: Secondary | ICD-10-CM | POA: Diagnosis present

## 2018-02-19 DIAGNOSIS — Z791 Long term (current) use of non-steroidal anti-inflammatories (NSAID): Secondary | ICD-10-CM

## 2018-02-19 DIAGNOSIS — R1012 Left upper quadrant pain: Secondary | ICD-10-CM | POA: Diagnosis not present

## 2018-02-19 DIAGNOSIS — R32 Unspecified urinary incontinence: Secondary | ICD-10-CM | POA: Diagnosis present

## 2018-02-19 DIAGNOSIS — G629 Polyneuropathy, unspecified: Secondary | ICD-10-CM | POA: Diagnosis present

## 2018-02-19 DIAGNOSIS — R1032 Left lower quadrant pain: Secondary | ICD-10-CM | POA: Diagnosis not present

## 2018-02-19 DIAGNOSIS — F329 Major depressive disorder, single episode, unspecified: Secondary | ICD-10-CM | POA: Diagnosis present

## 2018-02-19 DIAGNOSIS — I959 Hypotension, unspecified: Secondary | ICD-10-CM | POA: Diagnosis not present

## 2018-02-19 DIAGNOSIS — R0602 Shortness of breath: Secondary | ICD-10-CM | POA: Diagnosis not present

## 2018-02-19 DIAGNOSIS — M810 Age-related osteoporosis without current pathological fracture: Secondary | ICD-10-CM | POA: Diagnosis not present

## 2018-02-19 DIAGNOSIS — K219 Gastro-esophageal reflux disease without esophagitis: Secondary | ICD-10-CM | POA: Diagnosis present

## 2018-02-19 DIAGNOSIS — E875 Hyperkalemia: Secondary | ICD-10-CM | POA: Diagnosis not present

## 2018-02-19 DIAGNOSIS — Z23 Encounter for immunization: Secondary | ICD-10-CM

## 2018-02-19 DIAGNOSIS — J441 Chronic obstructive pulmonary disease with (acute) exacerbation: Secondary | ICD-10-CM | POA: Diagnosis present

## 2018-02-19 DIAGNOSIS — I480 Paroxysmal atrial fibrillation: Secondary | ICD-10-CM | POA: Diagnosis not present

## 2018-02-19 DIAGNOSIS — Z6841 Body Mass Index (BMI) 40.0 and over, adult: Secondary | ICD-10-CM

## 2018-02-19 DIAGNOSIS — G2581 Restless legs syndrome: Secondary | ICD-10-CM | POA: Diagnosis not present

## 2018-02-19 DIAGNOSIS — Z7982 Long term (current) use of aspirin: Secondary | ICD-10-CM

## 2018-02-19 DIAGNOSIS — F200 Paranoid schizophrenia: Secondary | ICD-10-CM | POA: Diagnosis not present

## 2018-02-19 DIAGNOSIS — I10 Essential (primary) hypertension: Secondary | ICD-10-CM | POA: Diagnosis present

## 2018-02-19 DIAGNOSIS — Z87891 Personal history of nicotine dependence: Secondary | ICD-10-CM | POA: Diagnosis not present

## 2018-02-19 DIAGNOSIS — R0902 Hypoxemia: Secondary | ICD-10-CM | POA: Diagnosis not present

## 2018-02-19 DIAGNOSIS — G4734 Idiopathic sleep related nonobstructive alveolar hypoventilation: Secondary | ICD-10-CM | POA: Diagnosis not present

## 2018-02-19 DIAGNOSIS — F419 Anxiety disorder, unspecified: Secondary | ICD-10-CM | POA: Diagnosis present

## 2018-02-19 DIAGNOSIS — I4891 Unspecified atrial fibrillation: Secondary | ICD-10-CM | POA: Diagnosis not present

## 2018-02-19 DIAGNOSIS — K5732 Diverticulitis of large intestine without perforation or abscess without bleeding: Secondary | ICD-10-CM | POA: Diagnosis not present

## 2018-02-19 DIAGNOSIS — R109 Unspecified abdominal pain: Secondary | ICD-10-CM | POA: Diagnosis not present

## 2018-02-19 DIAGNOSIS — R42 Dizziness and giddiness: Secondary | ICD-10-CM | POA: Diagnosis not present

## 2018-02-19 DIAGNOSIS — Z7951 Long term (current) use of inhaled steroids: Secondary | ICD-10-CM | POA: Diagnosis not present

## 2018-02-19 DIAGNOSIS — E878 Other disorders of electrolyte and fluid balance, not elsewhere classified: Secondary | ICD-10-CM | POA: Diagnosis not present

## 2018-02-19 DIAGNOSIS — Z79899 Other long term (current) drug therapy: Secondary | ICD-10-CM | POA: Diagnosis not present

## 2018-02-19 DIAGNOSIS — M6281 Muscle weakness (generalized): Secondary | ICD-10-CM | POA: Diagnosis not present

## 2018-02-19 DIAGNOSIS — J45998 Other asthma: Secondary | ICD-10-CM | POA: Diagnosis not present

## 2018-02-19 LAB — URINALYSIS, COMPLETE (UACMP) WITH MICROSCOPIC
Bacteria, UA: NONE SEEN
Bilirubin Urine: NEGATIVE
Glucose, UA: NEGATIVE mg/dL
Hgb urine dipstick: NEGATIVE
KETONES UR: 20 mg/dL — AB
Leukocytes, UA: NEGATIVE
Nitrite: NEGATIVE
PH: 8 (ref 5.0–8.0)
Protein, ur: NEGATIVE mg/dL
SPECIFIC GRAVITY, URINE: 1.013 (ref 1.005–1.030)
SQUAMOUS EPITHELIAL / LPF: NONE SEEN (ref 0–5)

## 2018-02-19 LAB — CBC WITH DIFFERENTIAL/PLATELET
BASOS ABS: 0 10*3/uL (ref 0–0.1)
Basophils Relative: 0 %
Eosinophils Absolute: 0 10*3/uL (ref 0–0.7)
Eosinophils Relative: 0 %
HCT: 40.8 % (ref 35.0–47.0)
Hemoglobin: 13.4 g/dL (ref 12.0–16.0)
LYMPHS ABS: 0.9 10*3/uL — AB (ref 1.0–3.6)
LYMPHS PCT: 20 %
MCH: 30.9 pg (ref 26.0–34.0)
MCHC: 32.9 g/dL (ref 32.0–36.0)
MCV: 93.9 fL (ref 80.0–100.0)
MONO ABS: 0.3 10*3/uL (ref 0.2–0.9)
Monocytes Relative: 7 %
Neutro Abs: 3.4 10*3/uL (ref 1.4–6.5)
Neutrophils Relative %: 73 %
Platelets: 178 10*3/uL (ref 150–440)
RBC: 4.35 MIL/uL (ref 3.80–5.20)
RDW: 15.4 % — ABNORMAL HIGH (ref 11.5–14.5)
WBC: 4.7 10*3/uL (ref 3.6–11.0)

## 2018-02-19 LAB — COMPREHENSIVE METABOLIC PANEL
ALT: 12 U/L (ref 0–44)
AST: 18 U/L (ref 15–41)
Albumin: 3.5 g/dL (ref 3.5–5.0)
Alkaline Phosphatase: 55 U/L (ref 38–126)
Anion gap: 7 (ref 5–15)
BILIRUBIN TOTAL: 0.6 mg/dL (ref 0.3–1.2)
BUN: 15 mg/dL (ref 8–23)
CO2: 31 mmol/L (ref 22–32)
CREATININE: 0.74 mg/dL (ref 0.44–1.00)
Calcium: 9.1 mg/dL (ref 8.9–10.3)
Chloride: 102 mmol/L (ref 98–111)
GFR calc Af Amer: >60 mL/min (ref >60)
Glucose, Bld: 100 mg/dL — ABNORMAL HIGH (ref 70–99)
Potassium: 4.2 mmol/L (ref 3.5–5.1)
Sodium: 140 mmol/L (ref 135–145)
TOTAL PROTEIN: 5.7 g/dL — AB (ref 6.5–8.1)

## 2018-02-19 LAB — APTT: APTT: 25 s (ref 24–36)

## 2018-02-19 LAB — TROPONIN I: Troponin I: <0.03 ng/mL (ref <0.03)

## 2018-02-19 LAB — PROTIME-INR
INR: 0.95
PROTHROMBIN TIME: 12.6 s (ref 11.4–15.2)

## 2018-02-19 LAB — LIPASE, BLOOD: LIPASE: 19 U/L (ref 11–51)

## 2018-02-19 MED ORDER — FLUTICASONE-UMECLIDIN-VILANT 100-62.5-25 MCG/INH IN AEPB: 1.0000 | INHALATION_SPRAY | Freq: Every day | RESPIRATORY_TRACT | Status: DC

## 2018-02-19 MED ORDER — CIPROFLOXACIN HCL 500 MG PO TABS
500.0000 mg | ORAL_TABLET | Freq: Two times a day (BID) | ORAL | Status: DC
  Administered 2018-02-19 – 2018-02-21 (×4): 500 mg via ORAL
  Filled 2018-02-19 (×6): qty 1

## 2018-02-19 MED ORDER — METRONIDAZOLE 500 MG PO TABS
500.0000 mg | ORAL_TABLET | Freq: Three times a day (TID) | ORAL | Status: DC
  Administered 2018-02-19 – 2018-02-21 (×5): 500 mg via ORAL
  Filled 2018-02-19 (×7): qty 1

## 2018-02-19 MED ORDER — THEOPHYLLINE ER 100 MG PO CP24
200.0000 mg | ORAL_CAPSULE | Freq: Every day | ORAL | Status: DC
  Administered 2018-02-21 – 2018-02-26 (×5): 200 mg via ORAL
  Filled 2018-02-19: qty 1
  Filled 2018-02-19: qty 2
  Filled 2018-02-19: qty 1
  Filled 2018-02-19: qty 2
  Filled 2018-02-19 (×2): qty 1
  Filled 2018-02-19: qty 2
  Filled 2018-02-19 (×2): qty 1
  Filled 2018-02-19: qty 2
  Filled 2018-02-19 (×2): qty 1

## 2018-02-19 MED ORDER — ONDANSETRON HCL 4 MG/2ML IJ SOLN: 4.0000 mg | Freq: Four times a day (QID) | INTRAMUSCULAR | Status: DC | PRN

## 2018-02-19 MED ORDER — METHYLPREDNISOLONE SODIUM SUCC 40 MG IJ SOLR
40.0000 mg | Freq: Two times a day (BID) | INTRAMUSCULAR | Status: DC
  Administered 2018-02-20 – 2018-02-21 (×3): 40 mg via INTRAVENOUS
  Filled 2018-02-19 (×3): qty 1

## 2018-02-19 MED ORDER — FLUTICASONE FUROATE-VILANTEROL 100-25 MCG/INH IN AEPB
1.0000 | INHALATION_SPRAY | Freq: Every day | RESPIRATORY_TRACT | Status: DC
  Administered 2018-02-20 – 2018-02-26 (×7): 1 via RESPIRATORY_TRACT
  Filled 2018-02-19: qty 28

## 2018-02-19 MED ORDER — GABAPENTIN 100 MG PO CAPS
100.0000 mg | ORAL_CAPSULE | Freq: Two times a day (BID) | ORAL | Status: DC
  Administered 2018-02-20 – 2018-02-26 (×9): 100 mg via ORAL
  Filled 2018-02-19 (×13): qty 1

## 2018-02-19 MED ORDER — ALBUTEROL SULFATE (2.5 MG/3ML) 0.083% IN NEBU
7.5000 mg | INHALATION_SOLUTION | Freq: Once | RESPIRATORY_TRACT | Status: AC
  Administered 2018-02-19: 7.5 mg via RESPIRATORY_TRACT
  Filled 2018-02-19: qty 9

## 2018-02-19 MED ORDER — OXYBUTYNIN CHLORIDE ER 10 MG PO TB24
10.0000 mg | ORAL_TABLET | Freq: Every day | ORAL | Status: DC
  Administered 2018-02-20 – 2018-02-26 (×7): 10 mg via ORAL
  Filled 2018-02-19 (×7): qty 1

## 2018-02-19 MED ORDER — ONDANSETRON HCL 4 MG PO TABS: 4.0000 mg | ORAL_TABLET | Freq: Four times a day (QID) | ORAL | Status: DC | PRN

## 2018-02-19 MED ORDER — INFLUENZA VAC SPLIT HIGH-DOSE 0.5 ML IM SUSY
0.5000 mL | PREFILLED_SYRINGE | INTRAMUSCULAR | Status: DC
  Filled 2018-02-19: qty 0.5

## 2018-02-19 MED ORDER — SACCHAROMYCES BOULARDII 250 MG PO CAPS
250.0000 mg | ORAL_CAPSULE | Freq: Every day | ORAL | Status: DC
  Administered 2018-02-20 – 2018-02-26 (×7): 250 mg via ORAL
  Filled 2018-02-19 (×7): qty 1

## 2018-02-19 MED ORDER — FLUTICASONE PROPIONATE 50 MCG/ACT NA SUSP
1.0000 | Freq: Every day | NASAL | Status: DC
  Administered 2018-02-20 – 2018-02-26 (×7): 1 via NASAL
  Filled 2018-02-19: qty 16

## 2018-02-19 MED ORDER — MAGNESIUM OXIDE 400 (241.3 MG) MG PO TABS
400.0000 mg | ORAL_TABLET | Freq: Two times a day (BID) | ORAL | Status: DC
  Administered 2018-02-19 – 2018-02-26 (×14): 400 mg via ORAL
  Filled 2018-02-19 (×14): qty 1

## 2018-02-19 MED ORDER — LORATADINE 10 MG PO TABS
10.0000 mg | ORAL_TABLET | Freq: Every day | ORAL | Status: DC
  Administered 2018-02-20 – 2018-02-26 (×7): 10 mg via ORAL
  Filled 2018-02-19 (×7): qty 1

## 2018-02-19 MED ORDER — SODIUM CHLORIDE 0.9 % IV SOLN
500.0000 mg | Freq: Once | INTRAVENOUS | Status: AC
  Administered 2018-02-19: 500 mg via INTRAVENOUS
  Filled 2018-02-19: qty 500

## 2018-02-19 MED ORDER — MELATONIN 5 MG PO TABS
10.0000 mg | ORAL_TABLET | Freq: Every evening | ORAL | Status: DC | PRN
  Administered 2018-02-22: 10 mg via ORAL
  Filled 2018-02-19 (×2): qty 2

## 2018-02-19 MED ORDER — BUSPIRONE HCL 5 MG PO TABS
10.0000 mg | ORAL_TABLET | Freq: Two times a day (BID) | ORAL | Status: DC
  Administered 2018-02-19 – 2018-02-26 (×14): 10 mg via ORAL
  Filled 2018-02-19 (×15): qty 2

## 2018-02-19 MED ORDER — BENAZEPRIL HCL 5 MG PO TABS
5.0000 mg | ORAL_TABLET | Freq: Every day | ORAL | Status: DC
  Administered 2018-02-20 – 2018-02-22 (×3): 5 mg via ORAL
  Filled 2018-02-19 (×4): qty 1

## 2018-02-19 MED ORDER — ASPIRIN EC 325 MG PO TBEC
325.0000 mg | DELAYED_RELEASE_TABLET | Freq: Every day | ORAL | Status: DC
  Administered 2018-02-20: 325 mg via ORAL
  Filled 2018-02-19: qty 1

## 2018-02-19 MED ORDER — IOPAMIDOL (ISOVUE-300) INJECTION 61%
100.0000 mL | Freq: Once | INTRAVENOUS | Status: AC | PRN
  Administered 2018-02-19: 100 mL via INTRAVENOUS

## 2018-02-19 MED ORDER — GABAPENTIN 300 MG PO CAPS
600.0000 mg | ORAL_CAPSULE | Freq: Every day | ORAL | Status: DC
  Administered 2018-02-19 – 2018-02-22 (×4): 600 mg via ORAL
  Filled 2018-02-19 (×4): qty 2

## 2018-02-19 MED ORDER — TRAMADOL HCL 50 MG PO TABS
100.0000 mg | ORAL_TABLET | Freq: Two times a day (BID) | ORAL | Status: DC | PRN
  Administered 2018-02-22: 100 mg via ORAL
  Filled 2018-02-19: qty 2

## 2018-02-19 MED ORDER — ADULT MULTIVITAMIN W/MINERALS CH
1.0000 | ORAL_TABLET | Freq: Every day | ORAL | Status: DC
  Administered 2018-02-20 – 2018-02-26 (×7): 1 via ORAL
  Filled 2018-02-19 (×7): qty 1

## 2018-02-19 MED ORDER — SODIUM CHLORIDE 0.9 % IV BOLUS
500.0000 mL | Freq: Once | INTRAVENOUS | Status: AC
  Administered 2018-02-19: 500 mL via INTRAVENOUS

## 2018-02-19 MED ORDER — DILTIAZEM HCL ER COATED BEADS 120 MG PO CP24
120.0000 mg | ORAL_CAPSULE | Freq: Every day | ORAL | Status: DC
  Administered 2018-02-19 – 2018-02-22 (×4): 120 mg via ORAL
  Filled 2018-02-19 (×4): qty 1

## 2018-02-19 MED ORDER — ONDANSETRON HCL 4 MG/2ML IJ SOLN
4.0000 mg | Freq: Once | INTRAMUSCULAR | Status: AC
  Administered 2018-02-19: 4 mg via INTRAVENOUS
  Filled 2018-02-19: qty 2

## 2018-02-19 MED ORDER — UMECLIDINIUM BROMIDE 62.5 MCG/INH IN AEPB
1.0000 | INHALATION_SPRAY | Freq: Every day | RESPIRATORY_TRACT | Status: DC
  Administered 2018-02-20 – 2018-02-26 (×7): 1 via RESPIRATORY_TRACT
  Filled 2018-02-19: qty 7

## 2018-02-19 MED ORDER — METHYLPREDNISOLONE SODIUM SUCC 125 MG IJ SOLR
125.0000 mg | Freq: Once | INTRAMUSCULAR | Status: AC
  Administered 2018-02-19: 125 mg via INTRAVENOUS
  Filled 2018-02-19: qty 2

## 2018-02-19 MED ORDER — ALBUTEROL SULFATE (2.5 MG/3ML) 0.083% IN NEBU
2.5000 mg | INHALATION_SOLUTION | Freq: Four times a day (QID) | RESPIRATORY_TRACT | Status: DC
  Administered 2018-02-19 – 2018-02-20 (×5): 2.5 mg via RESPIRATORY_TRACT
  Filled 2018-02-19 (×5): qty 3

## 2018-02-19 MED ORDER — TIZANIDINE HCL 4 MG PO TABS
4.0000 mg | ORAL_TABLET | Freq: Three times a day (TID) | ORAL | Status: DC | PRN
  Administered 2018-02-26: 4 mg via ORAL
  Filled 2018-02-19 (×2): qty 1

## 2018-02-19 MED ORDER — FUROSEMIDE 10 MG/ML IJ SOLN
40.0000 mg | Freq: Once | INTRAMUSCULAR | Status: AC
  Administered 2018-02-19: 40 mg via INTRAVENOUS
  Filled 2018-02-19: qty 4

## 2018-02-19 MED ORDER — RISPERIDONE 1 MG PO TABS
1.0000 mg | ORAL_TABLET | Freq: Every day | ORAL | Status: DC
  Administered 2018-02-19 – 2018-02-25 (×7): 1 mg via ORAL
  Filled 2018-02-19 (×9): qty 1

## 2018-02-19 MED ORDER — MONTELUKAST SODIUM 10 MG PO TABS
10.0000 mg | ORAL_TABLET | Freq: Every day | ORAL | Status: DC
  Administered 2018-02-19 – 2018-02-25 (×7): 10 mg via ORAL
  Filled 2018-02-19 (×7): qty 1

## 2018-02-19 MED ORDER — HEPARIN (PORCINE) IN NACL 100-0.45 UNIT/ML-% IJ SOLN
1100.0000 [IU]/h | INTRAMUSCULAR | Status: AC
  Administered 2018-02-19 – 2018-02-20 (×2): 1100 [IU]/h via INTRAVENOUS
  Filled 2018-02-19 (×2): qty 250

## 2018-02-19 MED ORDER — DIVALPROEX SODIUM 250 MG PO DR TAB
250.0000 mg | DELAYED_RELEASE_TABLET | Freq: Two times a day (BID) | ORAL | Status: DC
  Administered 2018-02-19 – 2018-02-26 (×14): 250 mg via ORAL
  Filled 2018-02-19 (×15): qty 1

## 2018-02-19 MED ORDER — HEPARIN BOLUS VIA INFUSION
4500.0000 [IU] | Freq: Once | INTRAVENOUS | Status: AC
  Administered 2018-02-19: 4500 [IU] via INTRAVENOUS
  Filled 2018-02-19: qty 4500

## 2018-02-19 MED ORDER — VITAMIN B-12 1000 MCG PO TABS
1000.0000 ug | ORAL_TABLET | Freq: Every day | ORAL | Status: DC
  Administered 2018-02-20 – 2018-02-26 (×7): 1000 ug via ORAL
  Filled 2018-02-19 (×7): qty 1

## 2018-02-19 MED ORDER — BUDESONIDE 0.5 MG/2ML IN SUSP: 0.5000 mg | Freq: Two times a day (BID) | RESPIRATORY_TRACT | Status: DC

## 2018-02-19 MED ORDER — ORAL CARE MOUTH RINSE
15.0000 mL | Freq: Two times a day (BID) | OROMUCOSAL | Status: DC
  Administered 2018-02-19 – 2018-02-26 (×7): 15 mL via OROMUCOSAL

## 2018-02-19 MED ORDER — CALCIUM CARBONATE-VITAMIN D 500-200 MG-UNIT PO TABS
1.0000 | ORAL_TABLET | Freq: Two times a day (BID) | ORAL | Status: DC
  Administered 2018-02-19 – 2018-02-26 (×14): 1 via ORAL
  Filled 2018-02-19 (×14): qty 1

## 2018-02-19 MED ORDER — MAGNESIUM SULFATE 2 GM/50ML IV SOLN
2.0000 g | Freq: Once | INTRAVENOUS | Status: AC
  Administered 2018-02-19: 2 g via INTRAVENOUS
  Filled 2018-02-19: qty 50

## 2018-02-19 MED ORDER — OMEGA-3-ACID ETHYL ESTERS 1 G PO CAPS
1.0000 g | ORAL_CAPSULE | Freq: Every day | ORAL | Status: DC
  Administered 2018-02-21 – 2018-02-26 (×6): 1 g via ORAL
  Filled 2018-02-19 (×6): qty 1

## 2018-02-19 MED ORDER — TRAZODONE HCL 50 MG PO TABS
150.0000 mg | ORAL_TABLET | Freq: Every day | ORAL | Status: DC
  Administered 2018-02-19 – 2018-02-25 (×6): 150 mg via ORAL
  Filled 2018-02-19 (×7): qty 3

## 2018-02-19 MED ORDER — FENTANYL CITRATE (PF) 100 MCG/2ML IJ SOLN
50.0000 ug | Freq: Once | INTRAMUSCULAR | Status: AC
  Administered 2018-02-19: 50 ug via INTRAVENOUS
  Filled 2018-02-19: qty 2

## 2018-02-19 NOTE — H&P (Signed)
[facility] [place] NAME: Vicki Morales    MR#:  [id number]  DATE OF BIRTH:  02-15-1941  DATE OF ADMISSION:  02/19/2018  PRIMARY CARE PHYSICIAN: Vicki Sons, MD   REQUESTING/REFERRING PHYSICIAN: Dr Larae Grooms  CHIEF COMPLAINT:   Chief Complaint  Patient presents with  . Abdominal Pain    HISTORY OF PRESENT ILLNESS:  Vicki Morales  is a 77 y.o. female states that she is having lots of abdominal pain.  This is been going on for 2 to 3 weeks.  She finished a course of antibiotics for diverticulitis.  She has been having lots of diarrhea.  Last night she took an Imodium.  Abdominal pain described as 6-7 out of 10 in intensity.  All the time.  Movement makes it worse.  She has not been eating very well.  She has been having some swelling all over her body.  States her breathing is now acting up.  She is short of breath and wheezing and coughing up some yellow phlegm.  She is nauseous but not having any vomiting.  She has had some weight loss and decreased appetite.  Hospitalist services contacted for further evaluation.  PAST MEDICAL HISTORY:   Past Medical History:  Diagnosis Date  . Anxiety   . Arthritis   . Cataract    right eye  . COPD (chronic obstructive pulmonary disease) ([place])   . Depression   . Dyspnea    DOE  . Dysrhythmia   . Edema    FEET/ANKLES  . GERD (gastroesophageal reflux disease)   . H/O wheezing   . History of hiatal hernia   . HOH (hard of hearing)   . Hypertension   . Incontinence of urine in female   . Orthopnea   . Pain    CHRONIC KNEE  . Pain    CHRONIC KNEE  . Paranoid disorder ([place])   . RLS (restless legs syndrome)   . Tremors of nervous system    HANDS    PAST SURGICAL HISTORY:   Past Surgical History:  Procedure Laterality Date  . ABDOMINAL HYSTERECTOMY  1988  . APPENDECTOMY  1988  . CARDIAC CATHETERIZATION    . CATARACT EXTRACTION W/PHACO Right 07/14/2016   Procedure:  CATARACT EXTRACTION PHACO AND INTRAOCULAR LENS PLACEMENT ([place]) anterior vitrectomy;  Surgeon: Estill Cotta, MD;  Location: [facility] ORS;  Service: Ophthalmology;  Laterality: Right;  [place] 02:40 AP% 24.4 CDE 59.98 Fluid pack # [id number]  . CATARACT EXTRACTION W/PHACO Left 02/16/2017   Procedure: CATARACT EXTRACTION PHACO AND INTRAOCULAR LENS PLACEMENT (IOC);  Surgeon: Estill Cotta, MD;  Location: [facility] ORS;  Service: Ophthalmology;  Laterality: Left;  Lot # [id number] H [place]: 01:11.8 AP%: 23.5 CDE: 32.09   . ESOPHAGOGASTRODUODENOSCOPY N/A 11/11/2014   Procedure: ESOPHAGOGASTRODUODENOSCOPY (EGD);  Surgeon: Josefine Class, MD;  Location: [facility] ENDOSCOPY;  Service: Endoscopy;  Laterality: N/A;  . TONSILLECTOMY AND ADENOIDECTOMY    . TRIGGER FINGER RELEASE  2004    SOCIAL HISTORY:   Social History   Tobacco Use  . Smoking status: Former Smoker    Years: 30.00  . Smokeless tobacco: Never Used  . Tobacco comment: quit in 1989  Substance Use Topics  . Alcohol use: No    Alcohol/week: 0.0 standard drinks    FAMILY HISTORY:   Family History  Problem Relation Age of Onset  . Breast cancer Sister   . Stroke Father   . Emphysema Father   . Anxiety disorder  Father   . Depression Father   . Anxiety disorder Brother     DRUG ALLERGIES:   Allergies  Allergen Reactions  . Arthrotec [Diclofenac-Misoprostol] Hives  . Codeine Other (See Comments)    Headache   . Diclofenac Other (See Comments) and Hives    Pt unsure allergy  . Hydrochlorothiazide Other (See Comments)    Weakness   . Penicillins Hives    Has patient had a PCN reaction causing immediate rash, facial/tongue/throat swelling, SOB or lightheadedness with hypotension: No Has patient had a PCN reaction causing severe rash involving mucus membranes or skin necrosis: No Has patient had a PCN reaction that required hospitalization: No Has patient had a PCN reaction occurring within the last 10 years: No If all of the above  answers are "NO", then may proceed with Cephalosporin use.  . Sulfa Antibiotics Hives  . Tiotropium Bromide Monohydrate Other (See Comments)    Blurry vision   . Tylenol [Acetaminophen] Other (See Comments)    Doesn't work  . Morphine Hives and Rash  . Pantoprazole Rash    REVIEW OF SYSTEMS:  CONSTITUTIONAL: No fever, chills or sweats.  Decreased appetite.  Some fatigue and weakness.  EYES:  EARS, NOSE, AND THROAT: No tinnitus or ear pain.  Positive for runny nose and sore throat RESPIRATORY: Positive for cough, shortness of breath, and wheezing.  No hemoptysis.  CARDIOVASCULAR: No chest pain.  Positive for edema.  GASTROINTESTINAL: Positive for nausea, diarrhea or abdominal pain. No blood in bowel movements.  No vomiting GENITOURINARY: No dysuria, hematuria.  ENDOCRINE: No polyuria, nocturia,  HEMATOLOGY: No anemia, easy bruising or bleeding SKIN: No rash or lesion. MUSCULOSKELETAL: No joint pain or arthritis.   NEUROLOGIC: No tingling, numbness, weakness.  PSYCHIATRY: Positive for anxiety depression  MEDICATIONS AT HOME:   Prior to Admission medications   Medication Sig Start Date End Date Taking? Authorizing Provider  albuterol (PROVENTIL) (2.5 MG/3ML) 0.083% nebulizer solution Take 3 mLs (2.5 mg total) by nebulization 2 (two) times daily as needed for wheezing or shortness of breath. 09/22/16  Yes Vicki Sons, MD  aspirin EC 325 MG tablet Take 1 tablet (325 mg total) by mouth daily. 09/23/15  Yes Demetrios Loll, MD  benazepril (LOTENSIN) 5 MG tablet TAKE 1 TABLET(5 MG) BY MOUTH DAILY Patient taking differently: Take 5 mg by mouth daily.  11/20/17  Yes Vicki Sons, MD  busPIRone (BUSPAR) 10 MG tablet Take 1 tablet (10 mg total) by mouth 3 (three) times daily. Patient taking differently: Take 10 mg by mouth 2 (two) times daily.  12/09/17  Yes Ursula Alert, MD  Calcium Carbonate-Vitamin D (CALCIUM 600+D) 600-400 MG-UNIT tablet Take 1 tablet by mouth 2 (two) times daily.    Yes  [provider]  Cranberry 250 MG CAPS Take 1 capsule by mouth daily.    Yes [provider]  cyanocobalamin 1000 MCG tablet Take 1,000 mcg by mouth daily.   Yes [provider]  divalproex (DEPAKOTE) 250 MG DR tablet Take 1 tablet (250 mg total) by mouth 2 (two) times daily. 12/09/17  Yes Eappen, Ria Clock, MD  fluticasone (FLONASE) 50 MCG/ACT nasal spray Place 1 spray into both nostrils daily.   Yes [provider]  Fluticasone-Umeclidin-Vilant (TRELEGY ELLIPTA) 100-62.5-25 MCG/INH AEPB Inhale 1 puff into the lungs daily. 11/08/17  Yes Vicki Sons, MD  gabapentin (NEURONTIN) 100 MG capsule TAKE 1 CAPSULE(100 MG) BY MOUTH TWICE DAILY Patient taking differently: Take 100 mg by mouth 2 (two) times  daily.  04/26/17  Yes Vicki Sons, MD  gabapentin (NEURONTIN) 300 MG capsule TAKE 2 CAPSULES(600 MG) BY MOUTH AT BEDTIME Patient taking differently: Take 600 mg by mouth at bedtime.  04/26/17  Yes Vicki Sons, MD  loratadine (CLARITIN) 10 MG tablet Take 10 mg by mouth daily.   Yes [provider]  magnesium oxide (MAG-OX) 400 MG tablet Take 400 mg by mouth 2 (two) times daily.   Yes [provider]  Melatonin 10 MG CAPS Take 10 mg by mouth at bedtime as needed (sleep).    Yes [provider]  meloxicam (MOBIC) 15 MG tablet Take 15 mg by mouth daily.    Yes [provider]  montelukast (SINGULAIR) 10 MG tablet TAKE 1 TABLET(10 MG) BY MOUTH EVERY NIGHT Patient taking differently: Take 10 mg by mouth at bedtime.  01/29/18  Yes Vicki Sons, MD  Multiple Vitamin (MULTIVITAMIN WITH MINERALS) TABS tablet Take 1 tablet by mouth daily.   Yes [provider]  NON FORMULARY Take 2 tablets by mouth 2 (two) times daily. Kyolic OTC for cholesterol   Yes [provider]  Omega-3 Fatty Acids (FISH OIL) 1000 MG CAPS Take 1,000 mg by mouth daily.    Yes [provider]  oxybutynin (DITROPAN-XL) 10 MG 24 hr tablet  TAKE 1 TABLET BY MOUTH AT BEDTIME Patient taking differently: Take 10 mg by mouth daily.  08/21/17  Yes Vicki Sons, MD  risperiDONE (RISPERDAL) 1 MG tablet Take 1 tablet (1 mg total) by mouth at bedtime. 12/09/17  Yes Ursula Alert, MD  Saccharomyces boulardii (PROBIOTIC) 250 MG CAPS Take 1 capsule by mouth 3 times/day as needed-between meals & bedtime. Patient taking differently: Take 1 capsule by mouth daily.  02/09/18  Yes Merlyn Lot, MD  theophylline (THEO-24) 200 MG 24 hr capsule Take 1 capsule (200 mg total) by mouth daily. 12/07/17  Yes Vicki Sons, MD  tiZANidine (ZANAFLEX) 4 MG tablet Take 1 tablet (4 mg total) by mouth every 8 (eight) hours as needed for muscle spasms. 06/17/17  Yes Vicki Sons, MD  traMADol (ULTRAM) 50 MG tablet TAKE 2 TABLETS BY MOUTH TWICE DAILY Patient taking differently: Take 100 mg by mouth 2 (two) times daily as needed for moderate pain.  12/04/17  Yes Vicki Sons, MD  traZODone (DESYREL) 100 MG tablet Take 1-2 tablets (100-200 mg total) by mouth at bedtime. Patient taking differently: Take 150 mg by mouth at bedtime.  02/07/18  Yes Ursula Alert, MD  furosemide (LASIX) 20 MG tablet Take 1 tablet (20 mg total) by mouth daily. Patient not taking: Reported on 02/19/2018 12/06/17 01/05/18  Hillary Bow, MD      VITAL SIGNS:  Blood pressure 136/65, pulse (!) 102, temperature (!) 97.4 F (36.3 C), resp. rate (!) 25, height 5' (1.524 m), weight 115.7 kg, SpO2 94 %.  PHYSICAL EXAMINATION:  GENERAL:  77 y.o.-year-old patient lying in the bed with no acute distress.  EYES: Pupils equal, round, reactive to light and accommodation. No scleral icterus. Extraocular muscles intact.  HEENT: Head atraumatic, normocephalic. Oropharynx and nasopharynx clear.  NECK:  Supple, no jugular venous distention. No thyroid enlargement, no tenderness.  LUNGS: Decreased breath sounds bilaterally, positive expiratory wheezing bilaterally.  No rales,rhonchi or  crepitation. No use of accessory muscles of respiration.  CARDIOVASCULAR: S1, S2 tachycardic. No murmurs, rubs, or gallops.  ABDOMEN: Soft, nontender, nondistended. Bowel sounds present. No organomegaly or mass.  EXTREMITIES: 2+ pedal edema, no  cyanosis, or clubbing.  NEUROLOGIC: Cranial nerves II through XII are intact. Muscle strength 5/5 in all extremities. Sensation intact. Gait not checked.  PSYCHIATRIC: The patient is alert and oriented x 3.  SKIN: No rash, lesion, or ulcer.   LABORATORY PANEL:   CBC Recent Labs  Lab 02/19/18 1322  WBC 4.7  HGB 13.4  HCT 40.8  PLT 178   ------------------------------------------------------------------------------------------------------------------  Chemistries  Recent Labs  Lab 02/19/18 1322  NA 140  K 4.2  CL 102  CO2 31  GLUCOSE 100*  BUN 15  CREATININE 0.74  CALCIUM 9.1  AST 18  ALT 12  ALKPHOS 55  BILITOT 0.6   ------------------------------------------------------------------------------------------------------------------  Cardiac Enzymes Recent Labs  Lab 02/19/18 1322  TROPONINI <0.03   ------------------------------------------------------------------------------------------------------------------  RADIOLOGY:  Dg Chest 1 View  Result Date: 02/19/2018 CLINICAL DATA:  Diverticulitis EXAM: CHEST  1 VIEW COMPARISON:  12/11/2017 FINDINGS: Lungs are essentially clear.  No focal consolidation. Cardiomegaly. IMPRESSION: No evidence of acute cardiopulmonary disease. Electronically Signed   By: Julian Hy M.D.   On: 02/19/2018 13:45   Ct Abdomen Pelvis W Contrast  Result Date: 02/19/2018 CLINICAL DATA:  Followup diverticulitis. Patient has persistent abdominal pain. EXAM: CT ABDOMEN AND PELVIS WITH CONTRAST TECHNIQUE: Multidetector CT imaging of the abdomen and pelvis was performed using the standard protocol following bolus administration of intravenous contrast. CONTRAST:  18mL ISOVUE-300 IOPAMIDOL (ISOVUE-300)  INJECTION 61% COMPARISON:  02/09/2018 FINDINGS: Lower chest: The lung bases are grossly clear. The heart is mildly enlarged but stable. No pericardial effusion. Hepatobiliary: No focal hepatic lesions or intrahepatic ductal dilatation. The gallbladder appears normal. No common bile duct dilatation. Pancreas: No mass, inflammation or ductal dilatation. Stable fatty changes. Spleen: Numerous scattered calcified granulomas but no mass or enlargement. Adrenals/Urinary Tract: Adrenal glands and kidneys are normal. No renal, ureteral or bladder calculi or mass. Stomach/Bowel: The stomach, duodenum, small bowel and colon are unremarkable. No acute inflammatory changes, mass lesions or obstructive findings. The terminal ileum is normal. The appendix is surgically absent. Significant sigmoid colon diverticulosis but no findings for acute diverticulitis. Vascular/Lymphatic: Stable advanced atherosclerotic calcifications involving the aorta and iliac arteries. No aneurysm or dissection. The branch vessels are patent. The major venous structures are patent. No mesenteric or retroperitoneal mass or adenopathy. Small scattered lymph nodes are stable. Reproductive: Surgically absent. Other: No pelvic mass or adenopathy. No free pelvic fluid collections. No inguinal mass or adenopathy. No abdominal wall hernia or subcutaneous lesions. Small stable periumbilical abdominal wall hernia containing fat. Musculoskeletal: No significant bony findings. Stable degenerative changes involving the spine. IMPRESSION: 1. No CT findings to suggest acute diverticulitis. Stable significant sigmoid colon diverticulosis. 2. No acute abdominal/pelvic findings, mass lesions or adenopathy. Electronically Signed   By: Marijo Sanes M.D.   On: 02/19/2018 14:44    EKG:   Atrial fibrillation 122 bpm  IMPRESSION AND PLAN:   1.  COPD exacerbation.  Patient given Solu-Medrol 125 mg in the ER.  I will give 40 mg IV twice daily.  Albuterol nebulizer.   Continue Trelegy inhaler. 2.  Abdominal pain, recent treatment for diverticulitis (with clindamycin), diarrhea.  Send stool for C. difficile.  Empiric Cipro and Flagyl for now.  CT scan today did not show any signs of diverticulitis even though last CAT scan did. 3.  New onset atrial fibrillation.  Likely from COPD exacerbation.  Start on heparin drip, give IV magnesium.  Start low-dose Cardizem CD. 4.  Essential hypertension continue usual medications 5.  Restless  leg syndrome continue usual medications 6.  Anxiety and depression continue her psychiatric medications 7.  Morbid obesity.  Weight loss needed for a BMI of 49.8 8.  Weakness.  Physical therapy evaluation.  All the records are reviewed and case discussed with ED provider. Management plans discussed with the patient, family and they are in agreement.  CODE STATUS: DNR  TOTAL TIME TAKING CARE OF THIS PATIENT: 50 minutes, including acp time.    Loletha Grayer M.D on 02/19/2018 at 5:41 PM  Between 7am to 6pm - Pager - [phone]  After 6pm call admission pager [phone]  [facility]  [phone]  CC: Primary care physician; Vicki Sons, MD

## 2018-02-19 NOTE — ED Triage Notes (Signed)
Recently treated for diverticulitis - finished her antibiotics, states doesn't feel any better.

## 2018-02-19 NOTE — Progress Notes (Signed)
ANTICOAGULATION CONSULT NOTE - Initial Consult  Pharmacy Consult for Heparin Drip Indication: atrial fibrillation  Allergies  Allergen Reactions  . Arthrotec [Diclofenac-Misoprostol] Hives  . Codeine Other (See Comments)    Headache   . Diclofenac Other (See Comments) and Hives    Pt unsure allergy  . Hydrochlorothiazide Other (See Comments)    Weakness   . Penicillins Hives    Has patient had a PCN reaction causing immediate rash, facial/tongue/throat swelling, SOB or lightheadedness with hypotension: No Has patient had a PCN reaction causing severe rash involving mucus membranes or skin necrosis: No Has patient had a PCN reaction that required hospitalization: No Has patient had a PCN reaction occurring within the last 10 years: No If all of the above answers are "NO", then may proceed with Cephalosporin use.  . Sulfa Antibiotics Hives  . Tiotropium Bromide Monohydrate Other (See Comments)    Blurry vision   . Tylenol [Acetaminophen] Other (See Comments)    Doesn't work  . Morphine Hives and Rash  . Pantoprazole Rash    Patient Measurements: Height: 5' (152.4 cm) Weight: 259 lb 14.4 oz (117.9 kg) IBW/kg (Calculated) : 45.5 Heparin Dosing Weight: 74.5 kg  Vital Signs: Temp: 97.8 F (36.6 C) (09/15 1925) Temp Source: Oral (09/15 1925) BP: 122/57 (09/15 1925) Pulse Rate: 104 (09/15 1925)  Labs: Recent Labs    02/19/18 1322 02/19/18 1323  HGB 13.4  --   HCT 40.8  --   PLT 178  --   APTT  --  25  LABPROT  --  12.6  INR  --  0.95  CREATININE 0.74  --   TROPONINI <0.03  --     Estimated Creatinine Clearance: 68.2 mL/min (by C-G formula based on SCr of 0.74 mg/dL).   Medical History: Past Medical History:  Diagnosis Date  . Anxiety   . Arthritis   . Cataract    right eye  . COPD (chronic obstructive pulmonary disease) ([place])   . Depression   . Dyspnea    DOE  . Dysrhythmia   . Edema    FEET/ANKLES  . GERD (gastroesophageal reflux disease)   . H/O  wheezing   . History of hiatal hernia   . HOH (hard of hearing)   . Hypertension   . Incontinence of urine in female   . Orthopnea   . Pain    CHRONIC KNEE  . Pain    CHRONIC KNEE  . Paranoid disorder ([place])   . RLS (restless legs syndrome)   . Tremors of nervous system    HANDS    Assessment: Patient is a 77yo female admitted for COPD exacerbation and new onset afib. Pharmacy consulted for Heparin dosing. Reviewed prior to admission medications, patient was not taking any anticoagulants prior to admission.  Goal of Therapy:  Heparin level 0.3-0.7 units/ml Monitor platelets by anticoagulation protocol: Yes   Plan:  Give 4500 units bolus x 1 Start heparin infusion at 1100 units/hr Check anti-Xa level in 8 hours and daily while on heparin Continue to monitor H&H and platelets  Paulina Fusi, PharmD, BCPS 02/19/2018 7:33 PM

## 2018-02-19 NOTE — Progress Notes (Signed)
Patient ID: Vicki Morales, female   DOB: [birth year], 77 y.o.   MRN: [id number]  ACP note  Patient and family at bedside  Diagnosis: COPD exacerbation, abdominal pain and recent treatment for diverticulitis and diarrhea, new onset atrial fibrillation, essential hypertension, restless leg syndrome, anxiety and depression, morbid obesity, weakness  CODE STATUS discussed and patient is a DO NOT RESUSCITATE  Plan.  Treat COPD exacerbation with steroids and nebulizer treatments.  Recent treatment for diverticulitis will give Cipro and Flagyl but also with her diarrhea send stool for C. difficile.  New onset atrial fibrillation likely secondary from COPD exacerbation start heparin drip and give IV magnesium and low-dose Cardizem CD.  Time spent on ACP discussion 17 minutes Dr. Loletha Grayer

## 2018-02-19 NOTE — ED Provider Notes (Signed)
[facility] Emergency Department Provider Note  ____________________________________________   First MD Initiated Contact with Patient 02/19/18 1312     (approximate)  I have reviewed the triage vital signs and the nursing notes.   HISTORY  Chief Complaint Abdominal Pain   HPI Vicki Morales is a 77 y.o. female with a history of COPD as well as recent history of sigmoid diverticulitis was presenting with persistent left-sided abdominal pain.  Says that the pain never went away despite her finishing her course of Cipro and Flagyl.  She also had a pneumonia this past June and says that she has persistent dry cough.  Does not report any fever.  Denies nausea, vomiting and diarrhea.   Past Medical History:  Diagnosis Date  . Anxiety   . Arthritis   . Cataract    right eye  . COPD (chronic obstructive pulmonary disease) ([place])   . Depression   . Dyspnea    DOE  . Dysrhythmia   . Edema    FEET/ANKLES  . GERD (gastroesophageal reflux disease)   . H/O wheezing   . History of hiatal hernia   . HOH (hard of hearing)   . Hypertension   . Incontinence of urine in female   . Orthopnea   . Pain    CHRONIC KNEE  . Pain    CHRONIC KNEE  . Paranoid disorder ([place])   . RLS (restless legs syndrome)   . Tremors of nervous system    HANDS    Patient Active Problem List   Diagnosis Date Noted  . Diastolic dysfunction 11/94/1740  . Sepsis ([place]) 12/03/2017  . Schizophrenia, undifferentiated ([place]) 11/24/2017  . COPD with acute exacerbation ([place]) 10/24/2017  . Nocturnal hypoxia 08/21/2016  . Restless leg syndrome 08/20/2016  . Impingement syndrome of shoulder region 07/29/2016  . Hyponatremia 03/21/2016  . Pneumonia 03/18/2016  . History of suicide attempt 03/16/2016  . Overdose 03/16/2016  . Pulmonary hypertension ([place]) 11/11/2015  . Anxiety 09/22/2015  . Depression 09/22/2015  . Osteoarthritis 09/10/2015  . Rosacea 07/03/2015  . Breast pain  11/07/2014  . Callus of foot 11/07/2014  . CN (constipation) 11/07/2014  . Dermatitis, eczematoid 11/07/2014  . Diverticulosis of colon 11/07/2014  . Dizziness 11/07/2014  . Can't get food down 11/07/2014  . Accumulation of fluid in tissues 11/07/2014  . Fatigue 11/07/2014  . FOM (frequency of micturition) 11/07/2014  . Tension type headache 11/07/2014  . LBP (low back pain) 11/07/2014  . Episodic mood disorder ([place]) 11/07/2014  . Extreme obesity 11/07/2014  . Muscle ache 11/07/2014  . Disturbance of skin sensation 11/07/2014  . Awareness of heartbeats 11/07/2014  . Jerking 11/07/2014  . Body tinea 11/07/2014  . Essential (primary) hypertension 10/10/2014  . Moderate COPD (chronic obstructive pulmonary disease) ([place]) 04/01/2014  . Hx of obesity 04/01/2014  . CAFL (chronic airflow limitation) ([place]) 01/25/2009  . Malaise and fatigue 11/26/2008  . Aphasia 09/26/2008  . 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 GERD (gastroesophageal reflux disease) 06/07/1998  . HLD (hyperlipidemia) 06/07/1998  . OP (osteoporosis) 06/07/1998  . H/O total hysterectomy 03/08/1987  . Schizophrenia, in remission ([place]) 06/07/1978    Past Surgical History:  Procedure Laterality Date  . ABDOMINAL HYSTERECTOMY  1988  . APPENDECTOMY  1988  . CARDIAC CATHETERIZATION    . CATARACT EXTRACTION W/PHACO Right 07/14/2016   Procedure: CATARACT EXTRACTION PHACO AND INTRAOCULAR LENS PLACEMENT ([place]) anterior vitrectomy;  Surgeon: Estill Cotta, MD;  Location: [facility] ORS;  Service: Ophthalmology;  Laterality: Right;  [place] 02:40 AP% 24.4 CDE 59.98 Fluid pack # [id number]  . CATARACT EXTRACTION W/PHACO Left 02/16/2017   Procedure: CATARACT EXTRACTION PHACO AND INTRAOCULAR LENS PLACEMENT (IOC);  Surgeon: Estill Cotta, MD;  Location: [facility] ORS;  Service: Ophthalmology;  Laterality: Left;  Lot # [id number] H [place]: 01:11.8 AP%: 23.5 CDE: 32.09   . ESOPHAGOGASTRODUODENOSCOPY N/A 11/11/2014   Procedure:  ESOPHAGOGASTRODUODENOSCOPY (EGD);  Surgeon: Josefine Class, MD;  Location: [facility] ENDOSCOPY;  Service: Endoscopy;  Laterality: N/A;  . TONSILLECTOMY AND ADENOIDECTOMY    . TRIGGER FINGER RELEASE  2004    Prior to Admission medications   Medication Sig Start Date End Date Taking? Authorizing Provider  albuterol (PROVENTIL) (2.5 MG/3ML) 0.083% nebulizer solution Take 3 mLs (2.5 mg total) by nebulization 2 (two) times daily as needed for wheezing or shortness of breath. 09/22/16   Birdie Sons, MD  aspirin EC 325 MG tablet Take 1 tablet (325 mg total) by mouth daily. 09/23/15   Demetrios Loll, MD  benazepril (LOTENSIN) 5 MG tablet TAKE 1 TABLET(5 MG) BY MOUTH DAILY 11/20/17   Birdie Sons, MD  busPIRone (BUSPAR) 10 MG tablet Take 1 tablet (10 mg total) by mouth 3 (three) times daily. 12/09/17   Ursula Alert, MD  Calcium Carbonate-Vitamin D (CALCIUM 600+D) 600-400 MG-UNIT tablet Take 1 tablet by mouth 3 (three) times daily.     [provider]  Cranberry 250 MG CAPS Take 1 capsule by mouth daily.     [provider]  divalproex (DEPAKOTE) 250 MG DR tablet Take 1 tablet (250 mg total) by mouth 2 (two) times daily. 12/09/17   Ursula Alert, MD  fluticasone (FLONASE) 50 MCG/ACT nasal spray Place 1 spray into both nostrils daily.    [provider]  Fluticasone-Salmeterol (WIXELA INHUB) 250-50 MCG/DOSE AEPB Inhale 1 puff into the lungs 2 (two) times daily.    [provider]  Fluticasone-Umeclidin-Vilant (TRELEGY ELLIPTA) 100-62.5-25 MCG/INH AEPB Inhale 1 puff into the lungs daily. 11/08/17   Birdie Sons, MD  furosemide (LASIX) 20 MG tablet Take 1 tablet (20 mg total) by mouth daily. 12/06/17 01/05/18  Hillary Bow, MD  gabapentin (NEURONTIN) 100 MG capsule TAKE 1 CAPSULE(100 MG) BY MOUTH TWICE DAILY 04/26/17   Birdie Sons, MD  gabapentin (NEURONTIN) 300 MG capsule TAKE 2 CAPSULES(600 MG) BY MOUTH AT BEDTIME 04/26/17   Birdie Sons, MD  hydrOXYzine  (ATARAX/VISTARIL) 25 MG tablet TAKE 1 TABLET(25 MG) BY MOUTH EVERY 8 HOURS AS NEEDED FOR ANXIETY 05/08/17   Birdie Sons, MD  loratadine (CLARITIN) 10 MG tablet Take 10 mg by mouth daily.    [provider]  magnesium oxide (MAG-OX) 400 MG tablet Take 400 mg by mouth 2 (two) times daily.    [provider]  Melatonin 10 MG CAPS Take 10 mg by mouth at bedtime as needed.    [provider]  meloxicam (MOBIC) 15 MG tablet TAKE 1 TABLET BY MOUTH EVERY DAY WITH A MEALS    [provider]  metroNIDAZOLE (FLAGYL) 500 MG tablet Take 1 tablet (500 mg total) by mouth 2 (two) times daily. 02/10/18   Birdie Sons, MD  Misc Natural Products (OSTEO BI-FLEX ADV DOUBLE ST PO) Take 1 tablet by mouth 2 (two) times daily.     [provider]  montelukast (SINGULAIR) 10 MG tablet TAKE 1 TABLET(10 MG) BY MOUTH EVERY NIGHT 01/29/18   Birdie Sons, MD  Multiple Vitamin (MULTIVITAMIN WITH  MINERALS) TABS tablet Take 1 tablet by mouth daily.    [provider]  Omega-3 Fatty Acids (FISH OIL) 1000 MG CAPS Take 1,000 mg by mouth daily.     [provider]  ondansetron (ZOFRAN) 4 MG tablet Take 1 tablet (4 mg total) by mouth daily as needed for nausea or vomiting. 02/09/18 02/09/19  Merlyn Lot, MD  oxybutynin (DITROPAN-XL) 10 MG 24 hr tablet TAKE 1 TABLET BY MOUTH AT BEDTIME 08/21/17   Birdie Sons, MD  risperiDONE (RISPERDAL) 1 MG tablet Take 1 tablet (1 mg total) by mouth at bedtime. 12/09/17   Ursula Alert, MD  Saccharomyces boulardii (PROBIOTIC) 250 MG CAPS Take 1 capsule by mouth 3 times/day as needed-between meals & bedtime. 02/09/18   Merlyn Lot, MD  theophylline (THEO-24) 200 MG 24 hr capsule Take 1 capsule (200 mg total) by mouth daily. 12/07/17   Birdie Sons, MD  tiZANidine (ZANAFLEX) 4 MG tablet Take 1 tablet (4 mg total) by mouth every 8 (eight) hours as needed for muscle spasms. 06/17/17   Birdie Sons, MD  traMADol Veatrice Bourbon) 50  MG tablet TAKE 2 TABLETS BY MOUTH TWICE DAILY 12/04/17   Birdie Sons, MD  traZODone (DESYREL) 100 MG tablet Take 1-2 tablets (100-200 mg total) by mouth at bedtime. 02/07/18   Ursula Alert, MD    Allergies Arthrotec [diclofenac-misoprostol]; Codeine; Diclofenac; Hydrochlorothiazide; Penicillins; Sulfa antibiotics; Tiotropium bromide monohydrate; Tylenol [acetaminophen]; Morphine; and Pantoprazole  Family History  Problem Relation Age of Onset  . Breast cancer Sister   . Stroke Father   . Emphysema Father   . Anxiety disorder Father   . Depression Father   . Anxiety disorder Brother     Social History Social History   Tobacco Use  . Smoking status: Former Smoker    Years: 30.00  . Smokeless tobacco: Never Used  . Tobacco comment: quit in 1989  Substance Use Topics  . Alcohol use: No    Alcohol/week: 0.0 standard drinks  . Drug use: No    Review of Systems  Constitutional: No fever/chills Eyes: No visual changes. ENT: No sore throat. Cardiovascular: Denies chest pain. Respiratory: As above Gastrointestinal: No nausea, no vomiting.  No diarrhea.  No constipation. Genitourinary: Negative for dysuria. Musculoskeletal: Negative for back pain. Skin: Negative for rash. Neurological: Negative for headaches, focal weakness or numbness.   ____________________________________________   PHYSICAL EXAM:  VITAL SIGNS: ED Triage Vitals  Enc Vitals Group     BP --      Pulse Rate 02/19/18 1316 (!) 115     Resp 02/19/18 1316 (!) 25     Temp 02/19/18 1316 (!) 97.4 F (36.3 C)     Temp src --      SpO2 02/19/18 1316 93 %     Weight 02/19/18 1317 255 lb (115.7 kg)     Height 02/19/18 1317 5' (1.524 m)     Head Circumference --      Peak Flow --      Pain Score 02/19/18 1317 7     Pain Loc --      Pain Edu? --      Excl. in [place]? --     Constitutional: Alert and oriented. Well appearing and in no acute distress. Eyes: Conjunctivae are normal.  Head:  Atraumatic. Nose: No congestion/rhinnorhea. Mouth/Throat: Mucous membranes are moist.  Neck: No stridor.   Cardiovascular: Normal rate, regular rhythm. Grossly normal heart sounds.   Respiratory: Mildly labored respirations.  No retractions.  Wheezing throughout. Gastrointestinal: Soft with tenderness to left upper and lower quadrants that is mild to moderate.  No rebound or guarding.  No distention. No  Musculoskeletal: No lower extremity tenderness nor edema.  No joint effusions. Neurologic:  Normal speech and language. No gross focal neurologic deficits are appreciated. Skin:  Skin is warm, dry and intact. No rash noted. Psychiatric: Mood and affect are normal. Speech and behavior are normal.  ____________________________________________   LABS (all labs ordered are listed, but only abnormal results are displayed)  Labs Reviewed  CBC WITH DIFFERENTIAL/PLATELET - Abnormal; Notable for the following components:      Result Value   RDW 15.4 (*)    Lymphs Abs 0.9 (*)    All other components within normal limits  COMPREHENSIVE METABOLIC PANEL - Abnormal; Notable for the following components:   Glucose, Bld 100 (*)    Total Protein 5.7 (*)    All other components within normal limits  URINALYSIS, COMPLETE (UACMP) WITH MICROSCOPIC - Abnormal; Notable for the following components:   Color, Urine YELLOW (*)    APPearance CLEAR (*)    Ketones, ur 20 (*)    All other components within normal limits  LIPASE, BLOOD  TROPONIN I   ____________________________________________  EKG  ED ECG REPORT I, Doran Stabler, the attending physician, personally viewed and interpreted this ECG.   Date: 02/19/2018  EKG Time: 1316  Rate: 122  Rhythm: normal sinus rhythm with frequent PVCs/intermittent bigeminy.  Axis: Normal  Intervals:none  ST&T Change: No ST segment elevation or depression.  No abnormal T wave inversion.  ____________________________________________  RADIOLOGY  Chest  x-ray without evidence of acute cardia pulmonary disease.  CT without acute finding. ____________________________________________   PROCEDURES  Procedure(s) performed:   Procedures  Critical Care performed:   ____________________________________________   INITIAL IMPRESSION / ASSESSMENT AND PLAN / ED COURSE  Pertinent labs & imaging results that were available during my care of the patient were reviewed by me and considered in my medical decision making (see chart for details).  Differential diagnosis includes, but is not limited to, ovarian cyst, ovarian torsion, acute appendicitis, diverticulitis, urinary tract infection/pyelonephritis, endometriosis, bowel obstruction, colitis, renal colic, gastroenteritis, hernia, fibroids, endometriosis, pregnancy related pain including ectopic pregnancy, etc. Differential diagnosis includes, but is not limited to, biliary disease (biliary colic, acute cholecystitis, cholangitis, choledocholithiasis, etc), intrathoracic causes for epigastric abdominal pain including ACS, gastritis, duodenitis, pancreatitis, small bowel or large bowel obstruction, abdominal aortic aneurysm, hernia, and ulcer(s). As part of my medical decision making, I reviewed the following data within the electronic MEDICAL RECORD NUMBER Notes from prior ED visits  ----------------------------------------- 4:39 PM on 02/19/2018 -----------------------------------------  Patient given albuterol nebs as well as Solu-Medrol.  Tried patient on room air oxygen and she desaturated down to 86 and 87%.  Persistent wheezing.  Will be admitted to the hospital.  Signed out the hospitalist, Dr Lelon Mast.  Discussed diagnosis as well as treatment with the patient as well as family who is at the bedside.  Reassuring CT of the abdomen.  Likely ongoing pain from her diverticular issue several weeks ago. ____________________________________________   FINAL CLINICAL IMPRESSION(S) / ED DIAGNOSES  COPD  exacerbation.  Hypoxia.  Abdominal pain.  NEW MEDICATIONS STARTED DURING THIS VISIT:  New Prescriptions   No medications on file     Note:  This document was prepared using Dragon voice recognition software and may include unintentional dictation errors.     Douglas Smolinsky, Randall An, MD  02/19/18 1641  

## 2018-02-19 NOTE — ED Notes (Signed)
Pt placed back on 2L nasal cannula and sats improved

## 2018-02-20 ENCOUNTER — Encounter: Payer: Self-pay | Admitting: *Deleted

## 2018-02-20 LAB — BASIC METABOLIC PANEL
Anion gap: 7 (ref 5–15)
BUN: 15 mg/dL (ref 8–23)
CHLORIDE: 102 mmol/L (ref 98–111)
CO2: 30 mmol/L (ref 22–32)
Calcium: 8.4 mg/dL — ABNORMAL LOW (ref 8.9–10.3)
Creatinine, Ser: 0.82 mg/dL (ref 0.44–1.00)
GFR calc non Af Amer: >60 mL/min (ref >60)
Glucose, Bld: 94 mg/dL (ref 70–99)
POTASSIUM: 5.1 mmol/L (ref 3.5–5.1)
SODIUM: 139 mmol/L (ref 135–145)

## 2018-02-20 LAB — HEPARIN LEVEL (UNFRACTIONATED)
Heparin Unfractionated: 0.6 IU/mL (ref 0.30–0.70)
Heparin Unfractionated: 0.53 IU/mL (ref 0.30–0.70)

## 2018-02-20 LAB — CBC
HEMATOCRIT: 36 % (ref 35.0–47.0)
HEMOGLOBIN: 12 g/dL (ref 12.0–16.0)
MCH: 31.4 pg (ref 26.0–34.0)
MCHC: 33.4 g/dL (ref 32.0–36.0)
MCV: 94 fL (ref 80.0–100.0)
PLATELETS: 156 10*3/uL (ref 150–440)
RBC: 3.83 MIL/uL (ref 3.80–5.20)
RDW: 15.2 % — ABNORMAL HIGH (ref 11.5–14.5)
WBC: 4.8 10*3/uL (ref 3.6–11.0)

## 2018-02-20 MED ORDER — VITAMIN C 500 MG PO TABS
250.0000 mg | ORAL_TABLET | Freq: Two times a day (BID) | ORAL | Status: DC
  Administered 2018-02-20 – 2018-02-26 (×13): 250 mg via ORAL
  Filled 2018-02-20 (×2): qty 0.5
  Filled 2018-02-20: qty 1
  Filled 2018-02-20 (×3): qty 0.5
  Filled 2018-02-20 (×2): qty 1
  Filled 2018-02-20: qty 0.5
  Filled 2018-02-20 (×5): qty 1

## 2018-02-20 MED ORDER — ALBUTEROL SULFATE (2.5 MG/3ML) 0.083% IN NEBU
2.5000 mg | INHALATION_SOLUTION | Freq: Three times a day (TID) | RESPIRATORY_TRACT | Status: DC
  Administered 2018-02-21 – 2018-02-25 (×12): 2.5 mg via RESPIRATORY_TRACT
  Filled 2018-02-20 (×13): qty 3

## 2018-02-20 MED ORDER — ENSURE ENLIVE PO LIQD
237.0000 mL | Freq: Two times a day (BID) | ORAL | Status: DC
  Administered 2018-02-20 – 2018-02-26 (×12): 237 mL via ORAL

## 2018-02-20 MED ORDER — RISAQUAD PO CAPS
1.0000 | ORAL_CAPSULE | Freq: Three times a day (TID) | ORAL | Status: DC
  Administered 2018-02-20 – 2018-02-26 (×17): 1 via ORAL
  Filled 2018-02-20 (×18): qty 1

## 2018-02-20 MED ORDER — ASPIRIN EC 81 MG PO TBEC
81.0000 mg | DELAYED_RELEASE_TABLET | Freq: Every day | ORAL | Status: DC
  Administered 2018-02-21 – 2018-02-26 (×6): 81 mg via ORAL
  Filled 2018-02-20 (×6): qty 1

## 2018-02-20 NOTE — Progress Notes (Signed)
ANTICOAGULATION CONSULT NOTE - Initial Consult  Pharmacy Consult for Heparin Drip Indication: atrial fibrillation  Allergies  Allergen Reactions  . Arthrotec [Diclofenac-Misoprostol] Hives  . Codeine Other (See Comments)    Headache   . Diclofenac Other (See Comments) and Hives    Pt unsure allergy  . Hydrochlorothiazide Other (See Comments)    Weakness   . Penicillins Hives    Has patient had a PCN reaction causing immediate rash, facial/tongue/throat swelling, SOB or lightheadedness with hypotension: No Has patient had a PCN reaction causing severe rash involving mucus membranes or skin necrosis: No Has patient had a PCN reaction that required hospitalization: No Has patient had a PCN reaction occurring within the last 10 years: No If all of the above answers are "NO", then may proceed with Cephalosporin use.  . Sulfa Antibiotics Hives  . Tiotropium Bromide Monohydrate Other (See Comments)    Blurry vision   . Tylenol [Acetaminophen] Other (See Comments)    Doesn't work  . Morphine Hives and Rash  . Pantoprazole Rash    Patient Measurements: Height: 5' (152.4 cm) Weight: 260 lb 4.8 oz (118.1 kg) IBW/kg (Calculated) : 45.5 Heparin Dosing Weight: 74.5 kg  Vital Signs: Temp: 98.3 F (36.8 C) (09/16 0430) Temp Source: Oral (09/16 0430) BP: 102/57 (09/16 0430) Pulse Rate: 47 (09/16 0430)  Labs: Recent Labs    02/19/18 1322 02/19/18 1323 02/20/18 0256 02/20/18 0506  HGB 13.4  --   --  12.0  HCT 40.8  --   --  36.0  PLT 178  --   --  156  APTT  --  25  --   --   LABPROT  --  12.6  --   --   INR  --  0.95  --   --   HEPARINUNFRC  --   --  0.60  --   CREATININE 0.74  --   --  0.82  TROPONINI <0.03  --   --   --     Estimated Creatinine Clearance: 66.5 mL/min (by C-G formula based on SCr of 0.82 mg/dL).   Medical History: Past Medical History:  Diagnosis Date  . Anxiety   . Arthritis   . Cataract    right eye  . COPD (chronic obstructive pulmonary  disease) ([place])   . Depression   . Dyspnea    DOE  . Dysrhythmia   . Edema    FEET/ANKLES  . GERD (gastroesophageal reflux disease)   . H/O wheezing   . History of hiatal hernia   . HOH (hard of hearing)   . Hypertension   . Incontinence of urine in female   . Orthopnea   . Pain    CHRONIC KNEE  . Pain    CHRONIC KNEE  . Paranoid disorder ([place])   . RLS (restless legs syndrome)   . Tremors of nervous system    HANDS    Assessment: Patient is a 77yo female admitted for COPD exacerbation and new onset afib. Pharmacy consulted for Heparin dosing. Reviewed prior to admission medications, patient was not taking any anticoagulants prior to admission.  Goal of Therapy:  Heparin level 0.3-0.7 units/ml Monitor platelets by anticoagulation protocol: Yes   Plan:  Give 4500 units bolus x 1 Start heparin infusion at 1100 units/hr Check anti-Xa level in 8 hours and daily while on heparin Continue to monitor H&H and platelets  09/15 0300 heparin level 0.6 will maintain current rate of 1100 units/hr and recheck heparin level  9/15 @ 1100.  Paulina Fusi, PharmD, BCPS 02/20/2018 7:44 AM

## 2018-02-20 NOTE — Plan of Care (Signed)
  Problem: Elimination: Goal: Will not experience complications related to bowel motility Outcome: Progressing   Problem: Pain Managment: Goal: General experience of comfort will improve Outcome: Progressing   Problem: Safety: Goal: Ability to remain free from injury will improve Outcome: Progressing   Problem: Skin Integrity: Goal: Risk for impaired skin integrity will decrease Outcome: Progressing   Problem: Respiratory: Goal: Levels of oxygenation will improve Outcome: Progressing Note:  On 1L of O2, still receiving IV steriods

## 2018-02-20 NOTE — Plan of Care (Signed)
  Problem: Clinical Measurements: Goal: Will remain free from infection Outcome: Progressing Note:  Patient remains on both PO Flagyl and Cipro, both to be administered today. Will continue to monitor for S&S of infectious process(es). Wenda Low [facility]

## 2018-02-20 NOTE — Care Management Note (Signed)
Case Management Note  Patient Details  Name: Vicki Morales MRN: [id number] Date of Birth: [birth year]  Subjective/Objective:    Admitted with abdominal pain and COPD exacerbation.  Patient is currently on 2L O2 acute.  She was recently treated for diverticulitis.  She receives home health RN, PT, OT with [facility].  Will Notified them when she discharges to resume care.   Currently lives at home with daughter.  She uses a walker.  Denies difficulty affording medications.  No transportation issues.  She is also newly diagnosed with A-Fib.  Per MD note, the plan is to continue IV Solu-Medrol with tapering as tolerated, aggressive pulmonary toilet with bronchodilator therapy, Trelegy inhaler.  Decrease heparin drip and convert to aspirin.  Also plan to wean O2.           Action/Plan:   Expected Discharge Date:                  Expected Discharge Plan:  [place]  In-House Referral:     Discharge planning Services  CM Consult  Post Acute Care Choice:  Resumption of Svcs/PTA Provider([facility]) Choice offered to:     DME Arranged:    DME Agency:     HH Arranged:    [place] Agency:  [facility]  Status of Service:  In process, will continue to follow  If discussed at Long Length of Stay Meetings, dates discussed:    Additional Comments:  Elza Rafter, RN 02/20/2018, 1:57 PM

## 2018-02-20 NOTE — Progress Notes (Signed)
Initial Nutrition Assessment  DOCUMENTATION CODES:   Morbid obesity  INTERVENTION:   Ensure Enlive po BID, each supplement provides 350 kcal and 20 grams of protein  MVI daily  Vitamin C 261m po BID  Liberalize diet   NUTRITION DIAGNOSIS:   Inadequate oral intake related to acute illness as evidenced by meal completion < 25%.  GOAL:   Patient will meet greater than or equal to 90% of their needs  MONITOR:   PO intake, Supplement acceptance, Labs, Weight trends, Skin, I & O's  REASON FOR ASSESSMENT:   Malnutrition Screening Tool    ASSESSMENT:   77y/o female admitted with COPD exacerbation, abdominal pain and recent treatment for diverticulitis and diarrhea, new onset atrial fibrillation, essential hypertension, restless leg syndrome, anxiety and depression, morbid obesity, weakness   Met with pt in room today. Pt reports poor appetite and oral intake for 3 weeks pta. Pt reports that she is just not hungry. Per chart, pt has lost 10lbs(4%) since July which is not significant. Pt ate <25% of her breakfast this morning. Pt does like vanilla Ensure and is willing to drink this in hospital. Pt noted to have bruising on bilateral arms; will initiate vitamin C and MVI. RD will liberalize pt's diet as a heart healthy diet restricts protein and pt is not likely eating enough to exceed nutrient limits. Pt given a menu today and showed how to order foods that she likes.   Medications reviewed and include: risaquad, aspirin, oscal w/ D, ciprofloxacin, Mg oxide, solu-medrol, metronidazole, lovaza, florastor, B12, heparin   Labs reviewed:   NUTRITION - FOCUSED PHYSICAL EXAM:    Most Recent Value  Orbital Region  No depletion  Upper Arm Region  No depletion  Thoracic and Lumbar Region  No depletion  Buccal Region  No depletion  Temple Region  No depletion  Clavicle Bone Region  No depletion  Clavicle and Acromion Bone Region  No depletion  Scapular Bone Region  No depletion   Dorsal Hand  No depletion  Patellar Region  No depletion  Anterior Thigh Region  No depletion  Posterior Calf Region  No depletion  Edema (RD Assessment)  Moderate  Hair  Reviewed  Eyes  Reviewed  Mouth  Reviewed  Skin  Reviewed [ecchymosis]  Nails  Reviewed     Diet Order:   Diet Order            Diet regular Room service appropriate? Yes; Fluid consistency: Thin  Diet effective now             EDUCATION NEEDS:   Education needs have been addressed  Skin:  Skin Assessment: Reviewed RN Assessment(ecchymosis )  Last BM:  9/14  Height:   Ht Readings from Last 1 Encounters:  02/19/18 5' (1.524 m)    Weight:   Wt Readings from Last 1 Encounters:  02/20/18 118.1 kg    Ideal Body Weight:  45.43 kg  BMI:  Body mass index is 50.84 kg/m.  Estimated Nutritional Needs:   Kcal:  1800-2100kcal/day   Protein:  118-130g/day   Fluid:  >1.6L/day   CKoleen DistanceMS, RD, LDN Pager #- [phone]#- [phone] Hours Pager: [phone]

## 2018-02-20 NOTE — Progress Notes (Signed)
[facility] [facility] [place] NAME: Vicki Morales    MR#:  [id number]  DATE OF BIRTH:  Nov 01, 1940  SUBJECTIVE:  CHIEF COMPLAINT:   Chief Complaint  Patient presents with  . Abdominal Pain   Patient states that she feels much better, denies abdominal pain, will attempt to wean off oxygen, cardiology to see REVIEW OF SYSTEMS:  CONSTITUTIONAL: No fever, fatigue or weakness.  EYES: No blurred or double vision.  EARS, NOSE, AND THROAT: No tinnitus or ear pain.  RESPIRATORY: No cough, shortness of breath, wheezing or hemoptysis.  CARDIOVASCULAR: No chest pain, orthopnea, edema.  GASTROINTESTINAL: No nausea, vomiting, diarrhea or abdominal pain.  GENITOURINARY: No dysuria, hematuria.  ENDOCRINE: No polyuria, nocturia,  HEMATOLOGY: No anemia, easy bruising or bleeding SKIN: No rash or lesion. MUSCULOSKELETAL: No joint pain or arthritis.   NEUROLOGIC: No tingling, numbness, weakness.  PSYCHIATRY: No anxiety or depression.   ROS  DRUG ALLERGIES:   Allergies  Allergen Reactions  . Arthrotec [Diclofenac-Misoprostol] Hives  . Codeine Other (See Comments)    Headache   . Diclofenac Other (See Comments) and Hives    Pt unsure allergy  . Hydrochlorothiazide Other (See Comments)    Weakness   . Penicillins Hives    Has patient had a PCN reaction causing immediate rash, facial/tongue/throat swelling, SOB or lightheadedness with hypotension: No Has patient had a PCN reaction causing severe rash involving mucus membranes or skin necrosis: No Has patient had a PCN reaction that required hospitalization: No Has patient had a PCN reaction occurring within the last 10 years: No If all of the above answers are "NO", then may proceed with Cephalosporin use.  . Sulfa Antibiotics Hives  . Tiotropium Bromide Monohydrate Other (See Comments)    Blurry vision   . Tylenol [Acetaminophen] Other (See Comments)    Doesn't work  . Morphine Hives and Rash  .  Pantoprazole Rash    VITALS:  Blood pressure 129/73, pulse (!) 52, temperature 98.3 F (36.8 C), temperature source Oral, resp. rate 16, height 5' (1.524 m), weight 118.1 kg, SpO2 96 %.  PHYSICAL EXAMINATION:  GENERAL:  77 y.o.-year-old patient lying in the bed with no acute distress.  EYES: Pupils equal, round, reactive to light and accommodation. No scleral icterus. Extraocular muscles intact.  HEENT: Head atraumatic, normocephalic. Oropharynx and nasopharynx clear.  NECK:  Supple, no jugular venous distention. No thyroid enlargement, no tenderness.  LUNGS: Normal breath sounds bilaterally, no wheezing, rales,rhonchi or crepitation. No use of accessory muscles of respiration.  CARDIOVASCULAR: S1, S2 normal. No murmurs, rubs, or gallops.  ABDOMEN: Soft, nontender, nondistended. Bowel sounds present. No organomegaly or mass.  EXTREMITIES: No pedal edema, cyanosis, or clubbing.  NEUROLOGIC: Cranial nerves II through XII are intact. Muscle strength 5/5 in all extremities. Sensation intact. Gait not checked.  PSYCHIATRIC: The patient is alert and oriented x 3.  SKIN: No obvious rash, lesion, or ulcer.   Physical Exam LABORATORY PANEL:   CBC Recent Labs  Lab 02/20/18 0506  WBC 4.8  HGB 12.0  HCT 36.0  PLT 156   ------------------------------------------------------------------------------------------------------------------  Chemistries  Recent Labs  Lab 02/19/18 1322 02/20/18 0506  NA 140 139  K 4.2 5.1  CL 102 102  CO2 31 30  GLUCOSE 100* 94  BUN 15 15  CREATININE 0.74 0.82  CALCIUM 9.1 8.4*  AST 18  --   ALT 12  --   ALKPHOS 55  --   BILITOT 0.6  --    ------------------------------------------------------------------------------------------------------------------  Cardiac Enzymes Recent Labs  Lab 02/19/18 1322  TROPONINI <0.03   ------------------------------------------------------------------------------------------------------------------  RADIOLOGY:   Dg Chest 1 View  Result Date: 02/19/2018 CLINICAL DATA:  Diverticulitis EXAM: CHEST  1 VIEW COMPARISON:  12/11/2017 FINDINGS: Lungs are essentially clear.  No focal consolidation. Cardiomegaly. IMPRESSION: No evidence of acute cardiopulmonary disease. Electronically Signed   By: Julian Hy M.D.   On: 02/19/2018 13:45   Ct Abdomen Pelvis W Contrast  Result Date: 02/19/2018 CLINICAL DATA:  Followup diverticulitis. Patient has persistent abdominal pain. EXAM: CT ABDOMEN AND PELVIS WITH CONTRAST TECHNIQUE: Multidetector CT imaging of the abdomen and pelvis was performed using the standard protocol following bolus administration of intravenous contrast. CONTRAST:  163mL ISOVUE-300 IOPAMIDOL (ISOVUE-300) INJECTION 61% COMPARISON:  02/09/2018 FINDINGS: Lower chest: The lung bases are grossly clear. The heart is mildly enlarged but stable. No pericardial effusion. Hepatobiliary: No focal hepatic lesions or intrahepatic ductal dilatation. The gallbladder appears normal. No common bile duct dilatation. Pancreas: No mass, inflammation or ductal dilatation. Stable fatty changes. Spleen: Numerous scattered calcified granulomas but no mass or enlargement. Adrenals/Urinary Tract: Adrenal glands and kidneys are normal. No renal, ureteral or bladder calculi or mass. Stomach/Bowel: The stomach, duodenum, small bowel and colon are unremarkable. No acute inflammatory changes, mass lesions or obstructive findings. The terminal ileum is normal. The appendix is surgically absent. Significant sigmoid colon diverticulosis but no findings for acute diverticulitis. Vascular/Lymphatic: Stable advanced atherosclerotic calcifications involving the aorta and iliac arteries. No aneurysm or dissection. The branch vessels are patent. The major venous structures are patent. No mesenteric or retroperitoneal mass or adenopathy. Small scattered lymph nodes are stable. Reproductive: Surgically absent. Other: No pelvic mass or adenopathy.  No free pelvic fluid collections. No inguinal mass or adenopathy. No abdominal wall hernia or subcutaneous lesions. Small stable periumbilical abdominal wall hernia containing fat. Musculoskeletal: No significant bony findings. Stable degenerative changes involving the spine. IMPRESSION: 1. No CT findings to suggest acute diverticulitis. Stable significant sigmoid colon diverticulosis. 2. No acute abdominal/pelvic findings, mass lesions or adenopathy. Electronically Signed   By: Marijo Sanes M.D.   On: 02/19/2018 14:44    ASSESSMENT AND PLAN:  *Acute on COPD exacerbation Resolving Continue IV Solu-Medrol with tapering as tolerated, aggressive pulmonary toilet with bronchodilator therapy, Trelegy inhaler  *Acute abdominal pain Resolved Noted recent treatment for diverticulitis with clindamycin -diarrhea resolved 3 days ago per patient Continue Cipro/Flagyl for very short course  *New onset atrial fibrillation Stable Continue heparin drip, reduce aspirin 81 mg, cardiology to see, echocardiogram from June this year noted for normal ejection fraction/stage I diastolic dysfunction  *Chronic benign essential hypertension Stable Continue current regiment  *Chronic anxiety/depression Stable Continue home psychotropic regiment  *Chronic morbid obesity Most likely secondary to excess calories Lifestyle modification recommended As ago therapy to evaluate/treat  *Chronic restless leg syndrome Stable   Disposition Home in 1 to 2 days barring any complications  All the records are reviewed and case discussed with Care Management/Social Workerr. Management plans discussed with the patient, family and they are in agreement.  CODE STATUS: dnr  TOTAL TIME TAKING CARE OF THIS PATIENT: 35 minutes.     POSSIBLE D/C IN 1-2 DAYS, DEPENDING ON CLINICAL CONDITION.   Avel Peace Salary M.D on 02/20/2018   Between 7am to 6pm - Pager - [phone]  After 6pm go to [email or web address] - password [email or web address]  [facility]  Office  [phone]  CC: Primary care physician; Birdie Sons, MD  Note: This dictation was  prepared with Dragon dictation along with smaller phrase technology. Any transcriptional errors that result from this process are unintentional.

## 2018-02-20 NOTE — Progress Notes (Signed)
Per direct discussion with Patient, patient scored Level 2 severity on Protocol Assessment. Per assessment Albuterol Treatment frequency changed from Q6 to TID. Patient is on 2l [place], SAT at 98%, Clear/diminished bilateral breath sounds, no significant findings on chest xray. Will continue to monitor.

## 2018-02-20 NOTE — Progress Notes (Signed)
ANTICOAGULATION CONSULT NOTE - Initial Consult  Pharmacy Consult for Heparin Drip Indication: atrial fibrillation  Allergies  Allergen Reactions  . Arthrotec [Diclofenac-Misoprostol] Hives  . Codeine Other (See Comments)    Headache   . Diclofenac Other (See Comments) and Hives    Pt unsure allergy  . Hydrochlorothiazide Other (See Comments)    Weakness   . Penicillins Hives    Has patient had a PCN reaction causing immediate rash, facial/tongue/throat swelling, SOB or lightheadedness with hypotension: No Has patient had a PCN reaction causing severe rash involving mucus membranes or skin necrosis: No Has patient had a PCN reaction that required hospitalization: No Has patient had a PCN reaction occurring within the last 10 years: No If all of the above answers are "NO", then may proceed with Cephalosporin use.  . Sulfa Antibiotics Hives  . Tiotropium Bromide Monohydrate Other (See Comments)    Blurry vision   . Tylenol [Acetaminophen] Other (See Comments)    Doesn't work  . Morphine Hives and Rash  . Pantoprazole Rash    Patient Measurements: Height: 5' (152.4 cm) Weight: 260 lb 4.8 oz (118.1 kg) IBW/kg (Calculated) : 45.5 Heparin Dosing Weight: 74.5 kg  Vital Signs: Temp: 98.3 F (36.8 C) (09/16 0430) Temp Source: Oral (09/16 0430) BP: 129/73 (09/16 0830) Pulse Rate: 52 (09/16 0830)  Labs: Recent Labs    02/19/18 1322 02/19/18 1323 02/20/18 0256 02/20/18 0506 02/20/18 1053  HGB 13.4  --   --  12.0  --   HCT 40.8  --   --  36.0  --   PLT 178  --   --  156  --   APTT  --  25  --   --   --   LABPROT  --  12.6  --   --   --   INR  --  0.95  --   --   --   HEPARINUNFRC  --   --  0.60  --  0.53  CREATININE 0.74  --   --  0.82  --   TROPONINI <0.03  --   --   --   --     Estimated Creatinine Clearance: 66.5 mL/min (by C-G formula based on SCr of 0.82 mg/dL).   Medical History: Past Medical History:  Diagnosis Date  . Anxiety   . Arthritis   . Cataract     right eye  . COPD (chronic obstructive pulmonary disease) ([place])   . Depression   . Dyspnea    DOE  . Dysrhythmia   . Edema    FEET/ANKLES  . GERD (gastroesophageal reflux disease)   . H/O wheezing   . History of hiatal hernia   . HOH (hard of hearing)   . Hypertension   . Incontinence of urine in female   . Orthopnea   . Pain    CHRONIC KNEE  . Pain    CHRONIC KNEE  . Paranoid disorder ([place])   . RLS (restless legs syndrome)   . Tremors of nervous system    HANDS    Assessment: Patient is a 77yo female admitted for COPD exacerbation and new onset afib. Pharmacy consulted for Heparin dosing. Reviewed prior to admission medications, patient was not taking any anticoagulants prior to admission.  Goal of Therapy:  Heparin level 0.3-0.7 units/ml Monitor platelets by anticoagulation protocol: Yes   Plan:  Give 4500 units bolus x 1 Start heparin infusion at 1100 units/hr Check anti-Xa level in 8 hours and  daily while on heparin Continue to monitor H&H and platelets  09/16 0300 heparin level 0.6 will maintain current rate of 1100 units/hr and recheck heparin level 9/15 @ 1100.  06/16 1100 heparin level 0.53 will maintain current rate of 1100 units/hour and will check heparin levels daily.  Evelena Asa, PharmD 02/20/2018 12:04 PM

## 2018-02-20 NOTE — Consult Note (Signed)
Reason for Consult: Atrial fibrillation shortness of breath dyspnea Referring Physician: Dr. Leslye Peer hospitalist Dr. Juanetta Beets primary Cardiologist Vicki Morales is an 77 y.o. female.  HPI: 77 year old female presented with abdominal pain patient has had symptoms for the last 3 weeks.  Face patient was a course of antibiotics for diverticulitis.  She is been having lots of diarrhea and is some concern about C. difficile.  His last night she took some Imodium abdominal pain has been relatively moderate to severe in intensity.  She has had swelling in her entire body she is also been short of breath with wheezing cough yellow sputum had some nausea no vomiting some weight loss with decreased appetite on evaluation she was found to have atrial fibrillation so cardiology was then recommended for consultation on assessment  Past Medical History:  Diagnosis Date  . Anxiety   . Arthritis   . Cataract    right eye  . COPD (chronic obstructive pulmonary disease) ([place])   . Depression   . Dyspnea    DOE  . Dysrhythmia   . Edema    FEET/ANKLES  . GERD (gastroesophageal reflux disease)   . H/O wheezing   . History of hiatal hernia   . HOH (hard of hearing)   . Hypertension   . Incontinence of urine in female   . Orthopnea   . Pain    CHRONIC KNEE  . Pain    CHRONIC KNEE  . Paranoid disorder ([place])   . RLS (restless legs syndrome)   . Tremors of nervous system    HANDS    Past Surgical History:  Procedure Laterality Date  . ABDOMINAL HYSTERECTOMY  1988  . APPENDECTOMY  1988  . CARDIAC CATHETERIZATION    . CATARACT EXTRACTION W/PHACO Right 07/14/2016   Procedure: CATARACT EXTRACTION PHACO AND INTRAOCULAR LENS PLACEMENT ([place]) anterior vitrectomy;  Surgeon: Estill Cotta, MD;  Location: [facility] ORS;  Service: Ophthalmology;  Laterality: Right;  [place] 02:40 AP% 24.4 CDE 59.98 Fluid pack # [id number]  . CATARACT EXTRACTION W/PHACO Left 02/16/2017   Procedure: CATARACT EXTRACTION PHACO  AND INTRAOCULAR LENS PLACEMENT (IOC);  Surgeon: Estill Cotta, MD;  Location: [facility] ORS;  Service: Ophthalmology;  Laterality: Left;  Lot # [id number] H [place]: 01:11.8 AP%: 23.5 CDE: 32.09   . ESOPHAGOGASTRODUODENOSCOPY N/A 11/11/2014   Procedure: ESOPHAGOGASTRODUODENOSCOPY (EGD);  Surgeon: Josefine Class, MD;  Location: [facility] ENDOSCOPY;  Service: Endoscopy;  Laterality: N/A;  . TONSILLECTOMY AND ADENOIDECTOMY    . TRIGGER FINGER RELEASE  2004    Family History  Problem Relation Age of Onset  . Breast cancer Sister   . Stroke Father   . Emphysema Father   . Anxiety disorder Father   . Depression Father   . Anxiety disorder Brother     Social History:  reports that she has quit smoking. She quit after 30.00 years of use. She has never used smokeless tobacco. She reports that she does not drink alcohol or use drugs.  Allergies:  Allergies  Allergen Reactions  . Arthrotec [Diclofenac-Misoprostol] Hives  . Codeine Other (See Comments)    Headache   . Diclofenac Other (See Comments) and Hives    Pt unsure allergy  . Hydrochlorothiazide Other (See Comments)    Weakness   . Penicillins Hives    Has patient had a PCN reaction causing immediate rash, facial/tongue/throat swelling, SOB or lightheadedness with hypotension: No Has patient had a PCN reaction causing severe rash involving mucus membranes or skin necrosis: No  Has patient had a PCN reaction that required hospitalization: No Has patient had a PCN reaction occurring within the last 10 years: No If all of the above answers are "NO", then may proceed with Cephalosporin use.  . Sulfa Antibiotics Hives  . Tiotropium Bromide Monohydrate Other (See Comments)    Blurry vision   . Tylenol [Acetaminophen] Other (See Comments)    Doesn't work  . Morphine Hives and Rash  . Pantoprazole Rash    Medications: I have reviewed the patient's current medications.  Results for orders placed or performed during the hospital encounter of  02/19/18 (from the past 48 hour(s))  CBC with Differential     Status: Abnormal   Collection Time: 02/19/18  1:22 PM  Result Value Ref Range   WBC 4.7 3.6 - 11.0 K/uL   RBC 4.35 3.80 - 5.20 MIL/uL   Hemoglobin 13.4 12.0 - 16.0 g/dL   HCT 40.8 35.0 - 47.0 %   MCV 93.9 80.0 - 100.0 fL   MCH 30.9 26.0 - 34.0 pg   MCHC 32.9 32.0 - 36.0 g/dL   RDW 15.4 (H) 11.5 - 14.5 %   Platelets 178 150 - 440 K/uL   Neutrophils Relative % 73 %   Neutro Abs 3.4 1.4 - 6.5 K/uL   Lymphocytes Relative 20 %   Lymphs Abs 0.9 (L) 1.0 - 3.6 K/uL   Monocytes Relative 7 %   Monocytes Absolute 0.3 0.2 - 0.9 K/uL   Eosinophils Relative 0 %   Eosinophils Absolute 0.0 0 - 0.7 K/uL   Basophils Relative 0 %   Basophils Absolute 0.0 0 - 0.1 K/uL    Comment: Performed at [facility], [address] [place], [place] [address]  Comprehensive metabolic panel     Status: Abnormal   Collection Time: 02/19/18  1:22 PM  Result Value Ref Range   Sodium 140 135 - 145 mmol/L   Potassium 4.2 3.5 - 5.1 mmol/L   Chloride 102 98 - 111 mmol/L   CO2 31 22 - 32 mmol/L   Glucose, Bld 100 (H) 70 - 99 mg/dL   BUN 15 8 - 23 mg/dL   Creatinine, Ser 0.74 0.44 - 1.00 mg/dL   Calcium 9.1 8.9 - 10.3 mg/dL   Total Protein 5.7 (L) 6.5 - 8.1 g/dL   Albumin 3.5 3.5 - 5.0 g/dL   AST 18 15 - 41 U/L   ALT 12 0 - 44 U/L   Alkaline Phosphatase 55 38 - 126 U/L   Total Bilirubin 0.6 0.3 - 1.2 mg/dL   GFR calc non Af Amer >60 >60 mL/min   GFR calc Af Amer >60 >60 mL/min    Comment: (NOTE) The eGFR has been calculated using the CKD EPI equation. This calculation has not been validated in all clinical situations. eGFR's persistently <60 mL/min signify possible Chronic Kidney Disease.    Anion gap 7 5 - 15    Comment: Performed at [facility], [address] [place], [place] [address]  Lipase, blood     Status: None   Collection Time: 02/19/18  1:22 PM  Result Value Ref Range   Lipase 19 11 - 51 U/L    Comment:  Performed at [facility], [address] [place],  [address]  Troponin I     Status: None   Collection Time: 02/19/18  1:22 PM  Result Value Ref Range   Troponin I <0.03 <0.03 ng/mL    Comment: Performed at [facility], [address] [place], [place] [address]  APTT     Status: None   Collection Time: 02/19/18  1:23 PM  Result Value Ref Range   aPTT 25 24 - 36 seconds    Comment: Performed at [facility], [address] [place], [place] [address]  Protime-INR     Status: None   Collection Time: 02/19/18  1:23 PM  Result Value Ref Range   Prothrombin Time 12.6 11.4 - 15.2 seconds   INR 0.95     Comment: Performed at [facility], [address] [place], [place] [address]  Urinalysis, Complete w Microscopic     Status: Abnormal   Collection Time: 02/19/18  1:44 PM  Result Value Ref Range   Color, Urine YELLOW (A) YELLOW   APPearance CLEAR (A) CLEAR   Specific Gravity, Urine 1.013 1.005 - 1.030   pH 8.0 5.0 - 8.0   Glucose, UA NEGATIVE NEGATIVE mg/dL   Hgb urine dipstick NEGATIVE NEGATIVE   Bilirubin Urine NEGATIVE NEGATIVE   Ketones, ur 20 (A) NEGATIVE mg/dL   Protein, ur NEGATIVE NEGATIVE mg/dL   Nitrite NEGATIVE NEGATIVE   Leukocytes, UA NEGATIVE NEGATIVE   WBC, UA 0-5 0 - 5 WBC/hpf   Bacteria, UA NONE SEEN NONE SEEN   Squamous Epithelial / LPF NONE SEEN 0 - 5   Mucus PRESENT     Comment: Performed at [facility], [place], [place] [address]  Heparin level (unfractionated)     Status: None   Collection Time: 02/20/18  2:56 AM  Result Value Ref Range   Heparin Unfractionated 0.60 0.30 - 0.70 IU/mL    Comment: (NOTE) If heparin results are below expected values, and patient dosage has  been confirmed, suggest follow up testing of antithrombin III levels. Performed at [facility] [address] [place], [place] [address]   Basic metabolic panel     Status: Abnormal   Collection  Time: 02/20/18  5:06 AM  Result Value Ref Range   Sodium 139 135 - 145 mmol/L   Potassium 5.1 3.5 - 5.1 mmol/L    Comment: HEMOLYSIS AT THIS LEVEL MAY AFFECT RESULT   Chloride 102 98 - 111 mmol/L   CO2 30 22 - 32 mmol/L   Glucose, Bld 94 70 - 99 mg/dL   BUN 15 8 - 23 mg/dL   Creatinine, Ser 0.82 0.44 - 1.00 mg/dL   Calcium 8.4 (L) 8.9 - 10.3 mg/dL   GFR calc non Af Amer >60 >60 mL/min   GFR calc Af Amer >60 >60 mL/min    Comment: (NOTE) The eGFR has been calculated using the CKD EPI equation. This calculation has not been validated in all clinical situations. eGFR's persistently <60 mL/min signify possible Chronic Kidney Disease.    Anion gap 7 5 - 15    Comment: Performed at [facility], [address] [place], [place] [address]  CBC     Status: Abnormal   Collection Time: 02/20/18  5:06 AM  Result Value Ref Range   WBC 4.8 3.6 - 11.0 K/uL   RBC 3.83 3.80 - 5.20 MIL/uL   Hemoglobin 12.0 12.0 - 16.0 g/dL   HCT 36.0 35.0 - 47.0 %   MCV 94.0 80.0 - 100.0 fL   MCH 31.4 26.0 - 34.0 pg   MCHC 33.4 32.0 - 36.0 g/dL   RDW 15.2 (H) 11.5 - 14.5 %   Platelets 156 150 - 440 K/uL  Comment: Performed at [facility], [place], [place] [address]  Heparin level (unfractionated)     Status: None   Collection Time: 02/20/18 10:53 AM  Result Value Ref Range   Heparin Unfractionated 0.53 0.30 - 0.70 IU/mL    Comment: (NOTE) If heparin results are below expected values, and patient dosage has  been confirmed, suggest follow up testing of antithrombin III levels. Performed at [facility], [address] [place], [place] [address]     Dg Chest 1 View  Result Date: 02/19/2018 CLINICAL DATA:  Diverticulitis EXAM: CHEST  1 VIEW COMPARISON:  12/11/2017 FINDINGS: Lungs are essentially clear.  No focal consolidation. Cardiomegaly. IMPRESSION: No evidence of acute cardiopulmonary disease. Electronically Signed   By: Julian Hy M.D.   On:  02/19/2018 13:45   Ct Abdomen Pelvis W Contrast  Result Date: 02/19/2018 CLINICAL DATA:  Followup diverticulitis. Patient has persistent abdominal pain. EXAM: CT ABDOMEN AND PELVIS WITH CONTRAST TECHNIQUE: Multidetector CT imaging of the abdomen and pelvis was performed using the standard protocol following bolus administration of intravenous contrast. CONTRAST:  167m ISOVUE-300 IOPAMIDOL (ISOVUE-300) INJECTION 61% COMPARISON:  02/09/2018 FINDINGS: Lower chest: The lung bases are grossly clear. The heart is mildly enlarged but stable. No pericardial effusion. Hepatobiliary: No focal hepatic lesions or intrahepatic ductal dilatation. The gallbladder appears normal. No common bile duct dilatation. Pancreas: No mass, inflammation or ductal dilatation. Stable fatty changes. Spleen: Numerous scattered calcified granulomas but no mass or enlargement. Adrenals/Urinary Tract: Adrenal glands and kidneys are normal. No renal, ureteral or bladder calculi or mass. Stomach/Bowel: The stomach, duodenum, small bowel and colon are unremarkable. No acute inflammatory changes, mass lesions or obstructive findings. The terminal ileum is normal. The appendix is surgically absent. Significant sigmoid colon diverticulosis but no findings for acute diverticulitis. Vascular/Lymphatic: Stable advanced atherosclerotic calcifications involving the aorta and iliac arteries. No aneurysm or dissection. The branch vessels are patent. The major venous structures are patent. No mesenteric or retroperitoneal mass or adenopathy. Small scattered lymph nodes are stable. Reproductive: Surgically absent. Other: No pelvic mass or adenopathy. No free pelvic fluid collections. No inguinal mass or adenopathy. No abdominal wall hernia or subcutaneous lesions. Small stable periumbilical abdominal wall hernia containing fat. Musculoskeletal: No significant bony findings. Stable degenerative changes involving the spine. IMPRESSION: 1. No CT findings to  suggest acute diverticulitis. Stable significant sigmoid colon diverticulosis. 2. No acute abdominal/pelvic findings, mass lesions or adenopathy. Electronically Signed   By: PMarijo SanesM.D.   On: 02/19/2018 14:44    Review of Systems  Constitutional: Positive for diaphoresis and malaise/fatigue.  HENT: Positive for congestion.   Eyes: Negative.   Respiratory: Positive for shortness of breath.   Cardiovascular: Positive for chest pain.  Gastrointestinal: Positive for heartburn.  Genitourinary: Negative.   Musculoskeletal: Positive for myalgias.  Skin: Negative.   Neurological: Positive for dizziness and weakness.  Endo/Heme/Allergies: Negative.   Psychiatric/Behavioral: Negative.    Blood pressure (!) 93/43, pulse 90, temperature 98 F (36.7 C), resp. rate 18, height 5' (1.524 m), weight 118.1 kg, SpO2 98 %. Physical Exam  Nursing note and vitals reviewed. Constitutional: She is oriented to person, place, and time. She appears well-nourished.  HENT:  Head: Normocephalic and atraumatic.  Eyes: Pupils are equal, round, and reactive to light. Conjunctivae and EOM are normal.  Neck: Normal range of motion. Neck supple.  Cardiovascular: Normal rate and normal pulses. An irregularly irregular rhythm present.  Respiratory: Effort normal and breath sounds normal.  GI: Soft. Bowel sounds are normal.  Musculoskeletal: Normal range of motion.  Neurological: She is alert and oriented to person, place, and time. She has normal reflexes.  Skin: Skin is warm and dry.  Psychiatric: She has a normal mood and affect.    Assessment/Plan: AFIB SOB Pul HTN Obesity Edema Anxiety COPD HTN GERD Quit . Plan Continue inhalers steroid therapy consider pulmonary input Pulmonary hypertension supplemental oxygen and inhalers COPD continue inhaler therapy continue steroid therapy follow-up with pulmonary continue to advised to refrain from tobacco Atrial fibrillation rapid ventricular response  rate control Cardizem consider adding beta-blocker as well as anticoagulation Continue hypertension control with Cardizem and Benzapril Recommend anxiety management Weight loss exercise portion control Consider sleep study possible obstructive sleep apnea Continue therapy for restless leg syndrome Abdominal pain evaluation and therapy with GI   Tally Mckinnon D Emeril Stille 02/20/2018, 9:26 PM

## 2018-02-21 LAB — HEPARIN LEVEL (UNFRACTIONATED): Heparin Unfractionated: 0.61 IU/mL (ref 0.30–0.70)

## 2018-02-21 MED ORDER — PREDNISONE 10 MG (21) PO TBPK
20.0000 mg | ORAL_TABLET | Freq: Every morning | ORAL | Status: AC
  Administered 2018-02-21: 20 mg via ORAL
  Filled 2018-02-21: qty 21

## 2018-02-21 MED ORDER — PREDNISONE 10 MG (21) PO TBPK
20.0000 mg | ORAL_TABLET | Freq: Every evening | ORAL | Status: AC
  Administered 2018-02-21: 20 mg via ORAL

## 2018-02-21 MED ORDER — PREDNISONE 10 MG (21) PO TBPK
20.0000 mg | ORAL_TABLET | Freq: Every evening | ORAL | Status: AC
  Administered 2018-02-22: 20 mg via ORAL

## 2018-02-21 MED ORDER — APIXABAN 5 MG PO TABS
5.0000 mg | ORAL_TABLET | Freq: Two times a day (BID) | ORAL | Status: DC
  Administered 2018-02-21 – 2018-02-26 (×11): 5 mg via ORAL
  Filled 2018-02-21 (×11): qty 1

## 2018-02-21 MED ORDER — PREDNISONE 10 MG (21) PO TBPK
10.0000 mg | ORAL_TABLET | Freq: Four times a day (QID) | ORAL | Status: AC
  Administered 2018-02-23 – 2018-02-26 (×10): 10 mg via ORAL

## 2018-02-21 MED ORDER — PREDNISONE 10 MG (21) PO TBPK
10.0000 mg | ORAL_TABLET | ORAL | Status: AC
  Administered 2018-02-21: 10 mg via ORAL

## 2018-02-21 MED ORDER — PREDNISONE 10 MG (21) PO TBPK
10.0000 mg | ORAL_TABLET | Freq: Three times a day (TID) | ORAL | Status: AC
  Administered 2018-02-22 (×3): 10 mg via ORAL

## 2018-02-21 NOTE — Care Management (Signed)
Eliquis will cost $45.00 at [facility].  Coupon for 30 day free trial given to patient.

## 2018-02-21 NOTE — Progress Notes (Signed)
ANTICOAGULATION CONSULT NOTE - Initial Consult  Pharmacy Consult for Heparin Drip Indication: atrial fibrillation  Allergies  Allergen Reactions  . Arthrotec [Diclofenac-Misoprostol] Hives  . Codeine Other (See Comments)    Headache   . Diclofenac Other (See Comments) and Hives    Pt unsure allergy  . Hydrochlorothiazide Other (See Comments)    Weakness   . Penicillins Hives    Has patient had a PCN reaction causing immediate rash, facial/tongue/throat swelling, SOB or lightheadedness with hypotension: No Has patient had a PCN reaction causing severe rash involving mucus membranes or skin necrosis: No Has patient had a PCN reaction that required hospitalization: No Has patient had a PCN reaction occurring within the last 10 years: No If all of the above answers are "NO", then may proceed with Cephalosporin use.  . Sulfa Antibiotics Hives  . Tiotropium Bromide Monohydrate Other (See Comments)    Blurry vision   . Tylenol [Acetaminophen] Other (See Comments)    Doesn't work  . Morphine Hives and Rash  . Pantoprazole Rash    Patient Measurements: Height: 5' (152.4 cm) Weight: 260 lb 4.8 oz (118.1 kg) IBW/kg (Calculated) : 45.5 Heparin Dosing Weight: 74.5 kg  Vital Signs: Temp: 98 F (36.7 C) (09/17 0355) Temp Source: Oral (09/17 0355) BP: 112/78 (09/17 0355) Pulse Rate: 93 (09/17 0355)  Labs: Recent Labs    02/19/18 1322 02/19/18 1323 02/20/18 0256 02/20/18 0506 02/20/18 1053 02/21/18 0628  HGB 13.4  --   --  12.0  --   --   HCT 40.8  --   --  36.0  --   --   PLT 178  --   --  156  --   --   APTT  --  25  --   --   --   --   LABPROT  --  12.6  --   --   --   --   INR  --  0.95  --   --   --   --   HEPARINUNFRC  --   --  0.60  --  0.53 0.61  CREATININE 0.74  --   --  0.82  --   --   TROPONINI <0.03  --   --   --   --   --     Estimated Creatinine Clearance: 66.5 mL/min (by C-G formula based on SCr of 0.82 mg/dL).   Medical History: Past Medical History:   Diagnosis Date  . Anxiety   . Arthritis   . Cataract    right eye  . COPD (chronic obstructive pulmonary disease) ([place])   . Depression   . Dyspnea    DOE  . Dysrhythmia   . Edema    FEET/ANKLES  . GERD (gastroesophageal reflux disease)   . H/O wheezing   . History of hiatal hernia   . HOH (hard of hearing)   . Hypertension   . Incontinence of urine in female   . Orthopnea   . Pain    CHRONIC KNEE  . Pain    CHRONIC KNEE  . Paranoid disorder ([place])   . RLS (restless legs syndrome)   . Tremors of nervous system    HANDS    Assessment: Patient is a 77yo female admitted for COPD exacerbation and new onset afib. Pharmacy consulted for Heparin dosing. Reviewed prior to admission medications, patient was not taking any anticoagulants prior to admission.  Goal of Therapy:  Heparin level 0.3-0.7 units/ml Monitor platelets  by anticoagulation protocol: Yes   Plan:  Give 4500 units bolus x 1 Start heparin infusion at 1100 units/hr Check anti-Xa level in 8 hours and daily while on heparin Continue to monitor H&H and platelets  09/16 0300 heparin level 0.6 will maintain current rate of 1100 units/hr and recheck heparin level 9/15 @ 1100.  09/16 1100 heparin level 0.53 will maintain current rate of 1100 units/hour and will check heparin levels daily.  9/17 AM heparin level 0.61. Continue current regimen. Recheck heparin level and CBC with tomorrow AM labs.  Sim Boast, PharmD, BCPS  02/21/18 7:04 AM

## 2018-02-21 NOTE — Care Management Note (Signed)
Case Management Note  Patient Details  Name: AIANA NORDQUIST MRN: [id number] Date of Birth: 04/29/1941  Subjective/Objective:   To room to given coupons for Eliquis as she is new A-Fib.  Patient states she has part D.  Contacted Dr. Jerelyn Charles via text/chat to ask if RNCM could call prescription into [facility] to assess cost.  Waiting to hear back from him.  Patient is much more down today and saying she doesn't want to be a burden and she states, "I'll be happy if I can make it to October when my son comes home".  Patient is wanting rehab; PT pending.  Reached out to Dr. Jerelyn Charles via sticky note to consider palliative care at D/C.  Plan is to wean O2 and discontinue IV solu-medrol.  Currently on 2.5L O2 [place].                 Action/Plan:   Expected Discharge Date:                  Expected Discharge Plan:  [place]  In-House Referral:     Discharge planning Services  CM Consult  Post Acute Care Choice:  Resumption of Svcs/PTA Provider([facility]) Choice offered to:     DME Arranged:    DME Agency:     HH Arranged:    [place] Agency:  [facility]  Status of Service:  In process, will continue to follow  If discussed at Long Length of Stay Meetings, dates discussed:    Additional Comments:  Elza Rafter, RN 02/21/2018, 2:44 PM

## 2018-02-21 NOTE — Progress Notes (Signed)
ANTICOAGULATION CONSULT NOTE - Initial Consult  Pharmacy Consult for apixaban Indication: atrial fibrillation  Allergies  Allergen Reactions  . Arthrotec [Diclofenac-Misoprostol] Hives  . Codeine Other (See Comments)    Headache   . Diclofenac Other (See Comments) and Hives    Pt unsure allergy  . Hydrochlorothiazide Other (See Comments)    Weakness   . Penicillins Hives    Has patient had a PCN reaction causing immediate rash, facial/tongue/throat swelling, SOB or lightheadedness with hypotension: No Has patient had a PCN reaction causing severe rash involving mucus membranes or skin necrosis: No Has patient had a PCN reaction that required hospitalization: No Has patient had a PCN reaction occurring within the last 10 years: No If all of the above answers are "NO", then may proceed with Cephalosporin use.  . Sulfa Antibiotics Hives  . Tiotropium Bromide Monohydrate Other (See Comments)    Blurry vision   . Tylenol [Acetaminophen] Other (See Comments)    Doesn't work  . Morphine Hives and Rash  . Pantoprazole Rash    Patient Measurements: Height: 5' (152.4 cm) Weight: 260 lb 4.8 oz (118.1 kg) IBW/kg (Calculated) : 45.5 Heparin Dosing Weight:   Vital Signs: Temp: 98 F (36.7 C) (09/17 0355) Temp Source: Oral (09/17 0355) BP: 112/78 (09/17 0355) Pulse Rate: 87 (09/17 0804)  Labs: Recent Labs    02/19/18 1322 02/19/18 1323 02/20/18 0256 02/20/18 0506 02/20/18 1053 02/21/18 0628  HGB 13.4  --   --  12.0  --   --   HCT 40.8  --   --  36.0  --   --   PLT 178  --   --  156  --   --   APTT  --  25  --   --   --   --   LABPROT  --  12.6  --   --   --   --   INR  --  0.95  --   --   --   --   HEPARINUNFRC  --   --  0.60  --  0.53 0.61  CREATININE 0.74  --   --  0.82  --   --   TROPONINI <0.03  --   --   --   --   --     Estimated Creatinine Clearance: 66.5 mL/min (by C-G formula based on SCr of 0.82 mg/dL).   Medical History: Past Medical History:  Diagnosis  Date  . Anxiety   . Arthritis   . Cataract    right eye  . COPD (chronic obstructive pulmonary disease) ([place])   . Depression   . Dyspnea    DOE  . Dysrhythmia   . Edema    FEET/ANKLES  . GERD (gastroesophageal reflux disease)   . H/O wheezing   . History of hiatal hernia   . HOH (hard of hearing)   . Hypertension   . Incontinence of urine in female   . Orthopnea   . Pain    CHRONIC KNEE  . Pain    CHRONIC KNEE  . Paranoid disorder ([place])   . RLS (restless legs syndrome)   . Tremors of nervous system    HANDS    Medications:  Infusions:    Assessment: Patient is a 77yo female admitted for COPD exacerbation and new onset afib. No PTA OAC listed. Patient has been on UFH inpatient. Pharmacy consulted to switch from UFH to apixaban.  Goal of Therapy:   Monitor platelets  by anticoagulation protocol: Yes   Plan:  Apixaban 5 mg po BID. Stop heparin when first dose of apixaban is given.  Laural Benes, Pharm.D., BCPS Clinical Pharmacist 02/21/2018,11:41 AM

## 2018-02-21 NOTE — Evaluation (Addendum)
Physical Therapy Evaluation Patient Details Name: Vicki Morales MRN: [id number] DOB: Jan 12, 1941 Today's Date: 02/21/2018   History of Present Illness  Pt is a pleasant 77 y.o female presenting with COPD exacerbation, abdominal pain, and new onset afib. PMH significant for COPD, paranoid disorder, dyspnea, and dysrythmia. After most recent hospital stay in July, pt spent 4 weeks in rehab and following discharge received HHPt.   Clinical Impression  Pt eager to work with PT today reporting she wants to get up and walk but has felt very dizzy and tired. BP obtained prior to treatment 127/115 and SpO2 on RA was 90%. PT applied 2L via nasal cannula sats rose to mid 90's quickly after. During examination HR staying in mid 80's and 90's. Pt currently presents with a decline from her baseline mobility based on reports of ambulation at home. Pt currently requiring no physical assistance for basic bed mobility with only UE assist required, however notable fatigue and dizziness following basic supine to sit transfer. Following coming to sitting, BP 117/84, HR reaching as high as 110's with exertion, and SpO2 on 2L of O2 was 94%. Pt unable to stand this date due to concerns of BP, lightheadedness, dizziness, reported weakness of LE, and significant fatigue. Based on limitations in ambulation and mobility secondary to acute hospital stay, new onset fib, and generalized weakness, pt would benefit most in a SNF to improve overall independence. Pt would continue to benefit from skilled PT during hospital stay to progress towards goals.    Follow Up Recommendations SNF    Equipment Recommendations       Recommendations for Other Services       Precautions / Restrictions Precautions Precautions: Fall Restrictions Weight Bearing Restrictions: No      Mobility  Bed Mobility Overal bed mobility: Needs Assistance Bed Mobility: Supine to Sit;Sit to Supine     Supine to sit: Supervision Sit to supine:  Supervision   General bed mobility comments: Pt with good effort during bed mobility requiring 2 UE assist from bed rails, increased fatigue following basic transfer, but no physical assistance required. Significant dizziness in supine at rest, increased following bed mobility.   Transfers                 General transfer comment: Unable to assess due to current fatigue and medical concerns.   Ambulation/Gait             General Gait Details: Unable to assess due to current fatigue and medical concerns.   Stairs            Wheelchair Mobility    Modified Rankin (Stroke Patients Only)       Balance Overall balance assessment: Modified Independent(No balance testing performed, however pt reporting significant lightheadidness and dizziness)   Sitting balance-Leahy Scale: Normal Sitting balance - Comments: No assistance needed to maintain sitting balance.                                      Pertinent Vitals/Pain Pain Assessment: No/denies pain    Home Living Family/patient expects to be discharged to:: Private residence Living Arrangements: Children Available Help at Discharge: Family;Available PRN/intermittently Type of Home: House Home Access: Ramped entrance     Home Layout: One level Home Equipment: Walker - 2 wheels;Cane - single point;Grab bars - toilet      Prior Function Level of Independence: Independent with assistive device(s)  Comments: Pt ambulates throughout home with RW unless needing to carry something then she uses a SPC. Independent with most ADL's but does not cook or drive per previous report.      Hand Dominance        Extremity/Trunk Assessment   Upper Extremity Assessment Upper Extremity Assessment: (Assess next visit)    Lower Extremity Assessment Lower Extremity Assessment: Generalized weakness(>4/5 MMT equal B LE, however pt reports numbness, swelling, and weakness in her legs that is  atypical from baseline)       Communication   Communication: No difficulties  Cognition Arousal/Alertness: Awake/alert Behavior During Therapy: WFL for tasks assessed/performed Overall Cognitive Status: Within Functional Limits for tasks assessed                                        General Comments General comments (skin integrity, edema, etc.): Pt using with SpO2 of 90% on RA prior treatment. BP 127/115 prior to treatment and HR ranging in the 80's and 90's. BP in sitting 117/84, HR consistently in mid 80's to mid 90's briefly reaching a high in 110's upon exertion, SpO2 on 2L of O2 94%.     Exercises     Assessment/Plan    PT Assessment Patient needs continued PT services  PT Problem List Decreased mobility;Decreased strength;Decreased safety awareness;Decreased activity tolerance;Cardiopulmonary status limiting activity;Decreased balance       PT Treatment Interventions DME instruction;Therapeutic activities;Patient/family education;Therapeutic exercise;Gait training;Balance training;Functional mobility training    PT Goals (Current goals can be found in the Care Plan section)  Acute Rehab PT Goals Patient Stated Goal: To go to rehab to improve strength and mobility PT Goal Formulation: With patient Time For Goal Achievement: 03/07/18 Potential to Achieve Goals: Good    Frequency Min 2X/week   Barriers to discharge Decreased caregiver support Pt with intermittent support at home with concerns for a lack of needed assistance.     Co-evaluation               AM-PAC PT "6 Clicks" Daily Activity  Outcome Measure Difficulty turning over in bed (including adjusting bedclothes, sheets and blankets)?: A Little Difficulty moving from lying on back to sitting on the side of the bed? : A Little Difficulty sitting down on and standing up from a chair with arms (e.g., wheelchair, bedside commode, etc,.)?: A Little Help needed moving to and from a bed to  chair (including a wheelchair)?: A Lot Help needed walking in hospital room?: A Lot Help needed climbing 3-5 steps with a railing? : Total 6 Click Score: 14    End of Session Equipment Utilized During Treatment: Gait belt;Oxygen Activity Tolerance: Patient limited by fatigue;Treatment limited secondary to medical complications (Comment) Patient left: with bed alarm set;with call bell/phone within reach Nurse Communication: Mobility status PT Visit Diagnosis: Unsteadiness on feet (R26.81);Other abnormalities of gait and mobility (R26.89);Difficulty in walking, not elsewhere classified (R26.2);Muscle weakness (generalized) (M62.81);Dizziness and giddiness (R42)    Time: 1448-1856 PT Time Calculation (min) (ACUTE ONLY): 35 min   Charges:              Ernie Avena, SPT 02/21/2018, 4:47 PM  During this treatment session, the therapist was present, participating in and directing the treatment.  This note has been reviewed and this clinician agrees with the information provided.    Wayne Both, PT, DPT [phone]

## 2018-02-21 NOTE — Progress Notes (Signed)
[facility] NAME: Vicki Morales    MR#:  [id number]  DATE OF BIRTH:  [birth year]  SUBJECTIVE:  CHIEF COMPLAINT:   Chief Complaint  Patient presents with  . Abdominal Pain  Patient with numerous complaints, states she does not feel right, patient states that she may need rehab-is okay with physical therapy, patient states that she has not had a bowel movement in 4 days-we will discontinue isolation precautions, patient denied/continues to deny any abdominal pain-discontinue Cipro/Flagyl  REVIEW OF SYSTEMS:  CONSTITUTIONAL: No fever, fatigue or weakness.  EYES: No blurred or double vision.  EARS, NOSE, AND THROAT: No tinnitus or ear pain.  RESPIRATORY: No cough, shortness of breath, wheezing or hemoptysis.  CARDIOVASCULAR: No chest pain, orthopnea, edema.  GASTROINTESTINAL: No nausea, vomiting, diarrhea or abdominal pain.  GENITOURINARY: No dysuria, hematuria.  ENDOCRINE: No polyuria, nocturia,  HEMATOLOGY: No anemia, easy bruising or bleeding SKIN: No rash or lesion. MUSCULOSKELETAL: No joint pain or arthritis.   NEUROLOGIC: No tingling, numbness, weakness.  PSYCHIATRY: No anxiety or depression.   ROS  DRUG ALLERGIES:   Allergies  Allergen Reactions  . Arthrotec [Diclofenac-Misoprostol] Hives  . Codeine Other (See Comments)    Headache   . Diclofenac Other (See Comments) and Hives    Pt unsure allergy  . Hydrochlorothiazide Other (See Comments)    Weakness   . Penicillins Hives    Has patient had a PCN reaction causing immediate rash, facial/tongue/throat swelling, SOB or lightheadedness with hypotension: No Has patient had a PCN reaction causing severe rash involving mucus membranes or skin necrosis: No Has patient had a PCN reaction that required hospitalization: No Has patient had a PCN reaction occurring within the last 10 years: No If all of the above answers are "NO", then may proceed with Cephalosporin use.  . Sulfa  Antibiotics Hives  . Tiotropium Bromide Monohydrate Other (See Comments)    Blurry vision   . Tylenol [Acetaminophen] Other (See Comments)    Doesn't work  . Morphine Hives and Rash  . Pantoprazole Rash    VITALS:  Blood pressure 112/78, pulse 87, temperature 98 F (36.7 C), temperature source Oral, resp. rate 18, height 5' (1.524 m), weight 118.1 kg, SpO2 94 %.  PHYSICAL EXAMINATION:  GENERAL:  77 y.o.-year-old patient lying in the bed with no acute distress.  EYES: Pupils equal, round, reactive to light and accommodation. No scleral icterus. Extraocular muscles intact.  HEENT: Head atraumatic, normocephalic. Oropharynx and nasopharynx clear.  NECK:  Supple, no jugular venous distention. No thyroid enlargement, no tenderness.  LUNGS: Normal breath sounds bilaterally, no wheezing, rales,rhonchi or crepitation. No use of accessory muscles of respiration.  CARDIOVASCULAR: S1, S2 normal. No murmurs, rubs, or gallops.  ABDOMEN: Soft, nontender, nondistended. Bowel sounds present. No organomegaly or mass.  EXTREMITIES: No pedal edema, cyanosis, or clubbing.  NEUROLOGIC: Cranial nerves II through XII are intact. Muscle strength 5/5 in all extremities. Sensation intact. Gait not checked.  PSYCHIATRIC: The patient is alert and oriented x 3.  SKIN: No obvious rash, lesion, or ulcer.   Physical Exam LABORATORY PANEL:   CBC Recent Labs  Lab 02/20/18 0506  WBC 4.8  HGB 12.0  HCT 36.0  PLT 156   ------------------------------------------------------------------------------------------------------------------  Chemistries  Recent Labs  Lab 02/19/18 1322 02/20/18 0506  NA 140 139  K 4.2 5.1  CL 102 102  CO2 31 30  GLUCOSE 100* 94  BUN 15 15  CREATININE 0.74 0.82  CALCIUM 9.1 8.4*  AST 18  --   ALT 12  --   ALKPHOS 55  --   BILITOT 0.6  --    ------------------------------------------------------------------------------------------------------------------  Cardiac  Enzymes Recent Labs  Lab 02/19/18 1322  TROPONINI <0.03   ------------------------------------------------------------------------------------------------------------------  RADIOLOGY:  Dg Chest 1 View  Result Date: 02/19/2018 CLINICAL DATA:  Diverticulitis EXAM: CHEST  1 VIEW COMPARISON:  12/11/2017 FINDINGS: Lungs are essentially clear.  No focal consolidation. Cardiomegaly. IMPRESSION: No evidence of acute cardiopulmonary disease. Electronically Signed   By: Julian Hy M.D.   On: 02/19/2018 13:45   Ct Abdomen Pelvis W Contrast  Result Date: 02/19/2018 CLINICAL DATA:  Followup diverticulitis. Patient has persistent abdominal pain. EXAM: CT ABDOMEN AND PELVIS WITH CONTRAST TECHNIQUE: Multidetector CT imaging of the abdomen and pelvis was performed using the standard protocol following bolus administration of intravenous contrast. CONTRAST:  175mL ISOVUE-300 IOPAMIDOL (ISOVUE-300) INJECTION 61% COMPARISON:  02/09/2018 FINDINGS: Lower chest: The lung bases are grossly clear. The heart is mildly enlarged but stable. No pericardial effusion. Hepatobiliary: No focal hepatic lesions or intrahepatic ductal dilatation. The gallbladder appears normal. No common bile duct dilatation. Pancreas: No mass, inflammation or ductal dilatation. Stable fatty changes. Spleen: Numerous scattered calcified granulomas but no mass or enlargement. Adrenals/Urinary Tract: Adrenal glands and kidneys are normal. No renal, ureteral or bladder calculi or mass. Stomach/Bowel: The stomach, duodenum, small bowel and colon are unremarkable. No acute inflammatory changes, mass lesions or obstructive findings. The terminal ileum is normal. The appendix is surgically absent. Significant sigmoid colon diverticulosis but no findings for acute diverticulitis. Vascular/Lymphatic: Stable advanced atherosclerotic calcifications involving the aorta and iliac arteries. No aneurysm or dissection. The branch vessels are patent. The major  venous structures are patent. No mesenteric or retroperitoneal mass or adenopathy. Small scattered lymph nodes are stable. Reproductive: Surgically absent. Other: No pelvic mass or adenopathy. No free pelvic fluid collections. No inguinal mass or adenopathy. No abdominal wall hernia or subcutaneous lesions. Small stable periumbilical abdominal wall hernia containing fat. Musculoskeletal: No significant bony findings. Stable degenerative changes involving the spine. IMPRESSION: 1. No CT findings to suggest acute diverticulitis. Stable significant sigmoid colon diverticulosis. 2. No acute abdominal/pelvic findings, mass lesions or adenopathy. Electronically Signed   By: Marijo Sanes M.D.   On: 02/19/2018 14:44    ASSESSMENT AND PLAN:  *Acute on COPD exacerbation Resolving Discontinue Solu-Medrol, prednisone taper, breathing treatments as needed, wean O2 off as tolerated, Trelegy inhaler  *Acute abdominal pain Resolved Patient continues to deny any abdominal pain/diarrhea-no bowel movement in 4 days Discontinue Cipro/Flagyl/enteric precautions  *New onset atrial fibrillation Stable Discontinue heparin drip, start Eliquis, aspirin 81 mg, cardiology input appreciated-no intervention recommended, echocardiogram from June this year noted for normal ejection fraction/stage I diastolic dysfunction  *Chronic benign essential hypertension Stable Continue current regiment  *Chronic anxiety/depression Stable Continue home psychotropic regiment  *Chronic morbid obesity Most likely secondary to excess calories Lifestyle modification recommended As ago therapy to evaluate/treat Sickle therapy to evaluate/treat  *Chronic restless leg syndrome Stable   Disposition on tomorrow barring any complications    All the records are reviewed and case discussed with Care Management/Social Workerr. Management plans discussed with the patient, family and they are in agreement.  CODE STATUS:  dnr  TOTAL TIME TAKING CARE OF THIS PATIENT: 40 minutes.     POSSIBLE D/C IN 1-2 DAYS, DEPENDING ON CLINICAL CONDITION.   Avel Peace Brittania Sudbeck M.D on 02/21/2018   Between 7am to 6pm - Pager - [phone]  After 6pm go to [email or web address] - password [email or web address]  Office  [phone]  CC: Primary care physician; Birdie Sons, MD  Note: This dictation was prepared with Dragon dictation along with smaller phrase technology. Any transcriptional errors that result from this process are unintentional.

## 2018-02-21 NOTE — Plan of Care (Signed)
  Problem: Nutrition: Goal: Adequate nutrition will be maintained Outcome: Progressing   Problem: Coping: Goal: Level of anxiety will decrease Outcome: Progressing Note:  Pt very down this shift, wants to discuss what would happen if she did not want to take her medications, and does not want to be a burden on her daughter,   Problem: Pain Managment: Goal: General experience of comfort will improve Outcome: Progressing Note:  No complaints of pain this shift   Problem: Safety: Goal: Ability to remain free from injury will improve Outcome: Progressing   Problem: Skin Integrity: Goal: Risk for impaired skin integrity will decrease Outcome: Progressing   Problem: Education: Goal: Knowledge of General Education information will improve Description Including pain rating scale, medication(s)/side effects and non-pharmacologic comfort measures Outcome: Completed/Met

## 2018-02-21 NOTE — Clinical Social Work Note (Signed)
CSW was informed by PT they are recommending SNF placement.  CSW contacted patient's insurance company and it is managed by [employer].  CSW to meet with patient at a later time to complete assessment and discuss if patient wants to go to SNF for short term rehab.  Jones Broom. [place], MSW, [place]  02/21/2018 5:16 PM

## 2018-02-22 ENCOUNTER — Ambulatory Visit: Payer: Self-pay | Admitting: Family Medicine

## 2018-02-22 LAB — CBC
HEMATOCRIT: 37.9 % (ref 35.0–47.0)
HEMOGLOBIN: 12.7 g/dL (ref 12.0–16.0)
MCH: 31.1 pg (ref 26.0–34.0)
MCHC: 33.5 g/dL (ref 32.0–36.0)
MCV: 92.8 fL (ref 80.0–100.0)
Platelets: 145 10*3/uL — ABNORMAL LOW (ref 150–440)
RBC: 4.08 MIL/uL (ref 3.80–5.20)
RDW: 14.7 % — AB (ref 11.5–14.5)
WBC: 6.4 10*3/uL (ref 3.6–11.0)

## 2018-02-22 LAB — THEOPHYLLINE LEVEL: Theophylline Lvl: 1.3 ug/mL — ABNORMAL LOW (ref 10.0–20.0)

## 2018-02-22 MED ORDER — FLORANEX PO PACK
1.0000 g | PACK | Freq: Three times a day (TID) | ORAL | Status: DC
  Administered 2018-02-23 – 2018-02-26 (×10): 1 g via ORAL
  Filled 2018-02-22 (×15): qty 1

## 2018-02-22 MED ORDER — GUAIFENESIN ER 600 MG PO TB12
600.0000 mg | ORAL_TABLET | Freq: Two times a day (BID) | ORAL | Status: DC
  Administered 2018-02-22 – 2018-02-26 (×9): 600 mg via ORAL
  Filled 2018-02-22 (×9): qty 1

## 2018-02-22 MED ORDER — BUDESONIDE 0.5 MG/2ML IN SUSP
0.5000 mg | Freq: Two times a day (BID) | RESPIRATORY_TRACT | Status: DC
  Administered 2018-02-22 – 2018-02-26 (×9): 0.5 mg via RESPIRATORY_TRACT
  Filled 2018-02-22 (×9): qty 2

## 2018-02-22 MED ORDER — AZITHROMYCIN 250 MG PO TABS
500.0000 mg | ORAL_TABLET | Freq: Every day | ORAL | Status: DC
Start: 2018-02-22 — End: 2018-02-26
  Administered 2018-02-22 – 2018-02-26 (×5): 500 mg via ORAL
  Filled 2018-02-22 (×6): qty 2

## 2018-02-22 NOTE — Clinical Social Work Note (Signed)
CSW met with patient and discussed PT recommendations for SNF placement.  Patient stated she would like to go to SNF for short term rehab.  Patient stated that she has been to rehab before, and would like to go to a SNF again.  CSW explained that insurance will have to approve her to go to SNF, patient gave CSW permission to begin bed search in [place].  Patient also asked to have CSW speak to her daughter.  CSW to begin bed search in [place].  Jones Broom. Norval Morton, MSW, [place]  02/22/2018 12:34 PM

## 2018-02-22 NOTE — NC FL2 (Signed)
[place] LEVEL OF CARE SCREENING TOOL     IDENTIFICATION  Patient Name: Vicki Morales Birthdate: [birth year] Sex: female Admission Date (Current Location): 02/19/2018  [place] and [place] Number:  Engineering geologist and Address:  [facility], [address], [place], [place] [address]      Provider Number: [id number]  Attending Physician Name and Address:  Gorden Harms, MD  Relative Name and Phone Number:     Corinda, Ammon Daughter [phone]   Raquelle, Pietro   [phone]       Current Level of Care: Hospital Recommended Level of Care: [place] Prior Approval Number:    Date Approved/Denied:   PASRR Number: [id number]  Discharge Plan: SNF    Current Diagnoses: Patient Active Problem List   Diagnosis Date Noted  . COPD exacerbation ([place]) 02/19/2018  . Diastolic dysfunction 63/06/6008  . Sepsis ([place]) 12/03/2017  . 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 COPD with acute exacerbation ([place]) 10/24/2017  . Nocturnal hypoxia 08/21/2016  . Restless leg syndrome 08/20/2016  . Impingement syndrome of shoulder region 07/29/2016  . Hyponatremia 03/21/2016  . Pneumonia 03/18/2016  . History of suicide attempt 03/16/2016  . Overdose 03/16/2016  . Pulmonary hypertension ([place]) 11/11/2015  . Anxiety 09/22/2015  . Depression 09/22/2015  . Osteoarthritis 09/10/2015  . Rosacea 07/03/2015  . Breast pain 11/07/2014  . Callus of foot 11/07/2014  . CN (constipation) 11/07/2014  . Dermatitis, eczematoid 11/07/2014  . Diverticulosis of colon 11/07/2014  . Dizziness 11/07/2014  . Can't get food down 11/07/2014  . Accumulation of fluid in tissues 11/07/2014  . Fatigue 11/07/2014  . FOM (frequency of micturition) 11/07/2014  . Tension type headache 11/07/2014  . LBP (low back pain) 11/07/2014  . Episodic mood disorder ([place]) 11/07/2014  . Extreme obesity 11/07/2014  .  Muscle ache 11/07/2014  . Disturbance of skin sensation 11/07/2014  . Awareness of heartbeats 11/07/2014  . Jerking 11/07/2014  . Body tinea 11/07/2014  . Essential (primary) hypertension 10/10/2014  . Moderate COPD (chronic obstructive pulmonary disease) ([place]) 04/01/2014  . Hx of obesity 04/01/2014  . CAFL (chronic airflow limitation) ([place]) 01/25/2009  . Malaise and fatigue 11/26/2008  . Aphasia 09/26/2008  . 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 GERD (gastroesophageal reflux disease) 06/07/1998  . HLD (hyperlipidemia) 06/07/1998  . OP (osteoporosis) 06/07/1998  . H/O total hysterectomy 03/08/1987  . Schizophrenia, in remission ([place]) 06/07/1978    Orientation RESPIRATION BLADDER Height & Weight     Self, Time, Situation, Place  O2(3L) Incontinent Weight: 260 lb 4.8 oz (118.1 kg) Height:  5' (152.4 cm)  BEHAVIORAL SYMPTOMS/MOOD NEUROLOGICAL BOWEL NUTRITION STATUS      Continent Diet(Regular diet)  AMBULATORY STATUS COMMUNICATION OF NEEDS Skin   Limited Assist Verbally Normal                       Personal Care Assistance Level of Assistance  Bathing, Feeding, Dressing Bathing Assistance: Limited assistance Feeding assistance: Independent Dressing Assistance: Limited assistance     Functional Limitations Info  Sight, Hearing, Speech Sight Info: Adequate Hearing Info: Adequate Speech Info: Adequate    SPECIAL CARE FACTORS FREQUENCY  PT (By licensed PT), OT (By licensed OT)     PT Frequency: 5x a week OT Frequency: 5x a week            Contractures Contractures Info: Not present    Additional Factors Info  Code Status, Allergies, Psychotropic Code Status Info: DNR Allergies Info: Arthrotec DICLOFENAC-MISOPROSTOL, CODEINE, DICLOFENAC, HYDROCHLOROTHIAZIDE, PENICILLINS, SULFA ANTIBIOTICS, TIOTROPIUM BROMIDE MONOHYDRATE, TYLENOL ACETAMINOPHEN, MORPHINE, PANTOPRAZOLE  Psychotropic Info: busPIRone (BUSPAR) tablet 10 mg and risperiDONE  (RISPERDAL) tablet 1 mg          Current Medications (02/22/2018):  This is the current hospital active medication list Current Facility-Administered Medications  Medication Dose Route Frequency Provider Last Rate Last Dose  . acidophilus (RISAQUAD) capsule 1 capsule  1 capsule Oral TID WC Salary, Montell D, MD   1 capsule at 02/22/18 0856  . albuterol (PROVENTIL) (2.5 MG/3ML) 0.083% nebulizer solution 2.5 mg  2.5 mg Nebulization TID Loney Hering D, MD   2.5 mg at 02/22/18 0750  . apixaban (ELIQUIS) tablet 5 mg  5 mg Oral BID Salary, Montell D, MD   5 mg at 02/22/18 1048  . aspirin EC tablet 81 mg  81 mg Oral Daily Salary, Montell D, MD   81 mg at 02/22/18 1047  . benazepril (LOTENSIN) tablet 5 mg  5 mg Oral Daily Loletha Grayer, MD   5 mg at 02/22/18 1051  . budesonide (PULMICORT) nebulizer solution 0.5 mg  0.5 mg Nebulization BID Salary, Montell D, MD      . busPIRone (BUSPAR) tablet 10 mg  10 mg Oral BID Loletha Grayer, MD   10 mg at 02/22/18 1048  . calcium-vitamin D (OSCAL WITH D) 500-200 MG-UNIT per tablet 1 tablet  1 tablet Oral BID Loletha Grayer, MD   1 tablet at 02/22/18 1047  . diltiazem (CARDIZEM CD) 24 hr capsule 120 mg  120 mg Oral Daily Loletha Grayer, MD   120 mg at 02/22/18 1048  . divalproex (DEPAKOTE) DR tablet 250 mg  250 mg Oral BID Loletha Grayer, MD   250 mg at 02/22/18 1049  . feeding supplement (ENSURE ENLIVE) (ENSURE ENLIVE) liquid 237 mL  237 mL Oral BID BM Loletha Grayer, MD   237 mL at 02/22/18 1052  . fluticasone (FLONASE) 50 MCG/ACT nasal spray 1 spray  1 spray Each Nare Daily Loletha Grayer, MD   1 spray at 02/22/18 1050  . fluticasone furoate-vilanterol (BREO ELLIPTA) 100-25 MCG/INH 1 puff  1 puff Inhalation Daily Loletha Grayer, MD   1 puff at 02/22/18 1051   And  . umeclidinium bromide (INCRUSE ELLIPTA) 62.5 MCG/INH 1 puff  1 puff Inhalation Daily Loletha Grayer, MD   1 puff at 02/22/18 1051  . gabapentin (NEURONTIN) capsule 100 mg  100 mg  Oral BID Loletha Grayer, MD   100 mg at 02/22/18 1048  . gabapentin (NEURONTIN) capsule 600 mg  600 mg Oral QHS Loletha Grayer, MD   600 mg at 02/21/18 2257  . guaiFENesin (MUCINEX) 12 hr tablet 600 mg  600 mg Oral BID Salary, Montell D, MD      . Influenza vac split quadrivalent PF (FLUZONE HIGH-DOSE) injection 0.5 mL  0.5 mL Intramuscular Tomorrow-1000 Wieting, Richard, MD      . loratadine (CLARITIN) tablet 10 mg  10 mg Oral Daily Loletha Grayer, MD   10 mg at 02/22/18 1048  . magnesium oxide (MAG-OX) tablet 400 mg  400 mg Oral BID Loletha Grayer, MD   400 mg at 02/22/18 1048  . MEDLINE mouth rinse  15 mL Mouth Rinse BID Loletha Grayer, MD   15 mL at 02/20/18 2204  . Melatonin TABS 10 mg  10 mg Oral QHS PRN Loletha Grayer, MD      . montelukast (SINGULAIR) tablet 10  mg  10 mg Oral QHS Loletha Grayer, MD   10 mg at 02/21/18 2257  . multivitamin with minerals tablet 1 tablet  1 tablet Oral Daily Loletha Grayer, MD   1 tablet at 02/22/18 1048  . omega-3 acid ethyl esters (LOVAZA) capsule 1 g  1 g Oral Daily Loletha Grayer, MD   1 g at 02/22/18 1048  . ondansetron (ZOFRAN) tablet 4 mg  4 mg Oral Q6H PRN Loletha Grayer, MD       Or  . ondansetron [facility]) injection 4 mg  4 mg Intravenous Q6H PRN Wieting, Richard, MD      . oxybutynin (DITROPAN-XL) 24 hr tablet 10 mg  10 mg Oral Daily Loletha Grayer, MD   10 mg at 02/22/18 1050  . predniSONE (STERAPRED UNI-PAK 21 TAB) tablet 10 mg  10 mg Oral 3 x daily with food Salary, Montell D, MD   10 mg at 02/22/18 0800  . [START ON 02/23/2018] predniSONE (STERAPRED UNI-PAK 21 TAB) tablet 10 mg  10 mg Oral 4X daily taper Salary, Montell D, MD      . predniSONE (STERAPRED UNI-PAK 21 TAB) tablet 20 mg  20 mg Oral Nightly Salary, Montell D, MD      . risperiDONE (RISPERDAL) tablet 1 mg  1 mg Oral QHS Loletha Grayer, MD   1 mg at 02/21/18 2258  . saccharomyces boulardii (FLORASTOR) capsule 250 mg  250 mg Oral Daily Loletha Grayer, MD   250  mg at 02/22/18 1053  . theophylline (THEO-24) 24 hr capsule 200 mg  200 mg Oral Daily Loletha Grayer, MD   200 mg at 02/21/18 1029  . tiZANidine (ZANAFLEX) tablet 4 mg  4 mg Oral Q8H PRN Wieting, Richard, MD      . traMADol Veatrice Bourbon) tablet 100 mg  100 mg Oral BID PRN Loletha Grayer, MD   100 mg at 02/22/18 0946  . traZODone (DESYREL) tablet 150 mg  150 mg Oral QHS Loletha Grayer, MD   150 mg at 02/21/18 2257  . vitamin B-12 (CYANOCOBALAMIN) tablet 1,000 mcg  1,000 mcg Oral Daily Loletha Grayer, MD   1,000 mcg at 02/22/18 1048  . vitamin C (ASCORBIC ACID) tablet 250 mg  250 mg Oral BID Loletha Grayer, MD   250 mg at 02/22/18 1051     Discharge Medications: Please see discharge summary for a list of discharge medications.  Relevant Imaging Results:  Relevant Lab Results:   Additional Information SSN: [id number]  Anell Barr

## 2018-02-22 NOTE — Clinical Social Work Note (Addendum)
CSW spoke to patient's daughter [name] [phone] to present bed offers.  Patient's daughter said she will review options and call CSW back with decision.  CSW still awaiting insurance approval, CSW to continue to follow patient's progress throughout discharge planning.  CSW spoke to patient and she said she would like to return back to [facility] if possible.  CSW left message on patient's daughter's voice mail to let her know that patient stated she would like to return to [facility].  Jones Broom. [place], MSW, [place]  02/22/2018 3:21 PM

## 2018-02-22 NOTE — Clinical Social Work Note (Signed)
Clinical Social Work Assessment  Patient Details  Name: Vicki Morales MRN: [id number] Date of Birth: [birth year]  Date of referral:  02/22/18               Reason for consult:  Facility Placement                Permission sought to share information with:  Family Supports, Customer service manager Permission granted to share information::  Yes, Verbal Permission Granted  Name::     Vicki Morales, Vicki Morales Daughter [phone]   Agency::  SNF admissions  Relationship::     Contact Information:     Housing/Transportation Living arrangements for the past 2 months:  [place], [place] of Information:  Patient, Adult Children Patient Interpreter Needed:  None Criminal Activity/Legal Involvement Pertinent to Current Situation/Hospitalization:  No - Comment as needed Significant Relationships:  Adult Children Lives with:  Adult Children Do you feel safe going back to the place where you live?  No Need for family participation in patient care:  No (Coment)  Care giving concerns:  Patient and her daughter feel like she needs some short term rehab before she is able to return back home.   Social Worker assessment / plan:  Patient is a 77 year old female who is alert and oriented x4  Patient lives with her daughter, but feels like she needs SNF placement first before she is able to return back home.  Patient states she has been to rehab before at [facility], and was just recently at [facility] in August.  Patient states she would like to return back to SNF if she can.  CSW explained to patient it will depend on if insurance will approve her to return or not.  Patient was explained role of CSW and process for looking for placement.  CSW informed patient that since she has not had a 60 wellness period her days will just continue where she left off.  Patient does have a supplementary plan, which can help with the copays.  Patient asked  CSW to speak with her daughter about SNF recommendation, CSW contacted daughter and provided bed offers and explained how her insurance will pick up where it left off.  Patient gave CSW permission to begin bed search process in [place].                      Employment status:  Retired Nurse, adult PT Recommendations:  [place] / Referral to community resources:  [place]  Patient/Family's Response to care:  Patient is in agreement to going to SNF for short term rehab.  Patient/Family's Understanding of and Emotional Response to Diagnosis, Current Treatment, and Prognosis:  Patient is motivated to work with therapy in order to get stronger again to return back home. Emotional Assessment Appearance:  Appears stated age Attitude/Demeanor/Rapport:    Affect (typically observed):  Appropriate, Calm Orientation:  Oriented to  Time, Oriented to Place, Oriented to Self, Oriented to Situation Alcohol / Substance use:  Not Applicable Psych involvement (Current and /or in the community):  No (Comment)  Discharge Needs  Concerns to be addressed:  Lack of Support, Care Coordination Readmission within the last 30 days:  No Current discharge risk:  Lack of support system Barriers to Discharge:  Continued Medical Work up, [employer]   Anell Barr 02/22/2018, 4:41 PM

## 2018-02-22 NOTE — Care Management Important Message (Signed)
Copy of signed IM left with patient in room.  

## 2018-02-22 NOTE — Progress Notes (Addendum)
Vicki Morales at [place] NAME: Vicki Morales    MR#:  [id number]  DATE OF BIRTH:  Jan 09, 1941  SUBJECTIVE:  CHIEF COMPLAINT:   Chief Complaint  Patient presents with  . Abdominal Pain  Patient feeling better, unable to wean off O2-we will assess for home oxygen, productive cough noted, start Mucinex  REVIEW OF SYSTEMS:  CONSTITUTIONAL: No fever, fatigue or weakness.  EYES: No blurred or double vision.  EARS, NOSE, AND THROAT: No tinnitus or ear pain.  RESPIRATORY: No cough, shortness of breath, wheezing or hemoptysis.  CARDIOVASCULAR: No chest pain, orthopnea, edema.  GASTROINTESTINAL: No nausea, vomiting, diarrhea or abdominal pain.  GENITOURINARY: No dysuria, hematuria.  ENDOCRINE: No polyuria, nocturia,  HEMATOLOGY: No anemia, easy bruising or bleeding SKIN: No rash or lesion. MUSCULOSKELETAL: No joint pain or arthritis.   NEUROLOGIC: No tingling, numbness, weakness.  PSYCHIATRY: No anxiety or depression.   ROS  DRUG ALLERGIES:   Allergies  Allergen Reactions  . Arthrotec [Diclofenac-Misoprostol] Hives  . Codeine Other (See Comments)    Headache   . Diclofenac Other (See Comments) and Hives    Pt unsure allergy  . Hydrochlorothiazide Other (See Comments)    Weakness   . Penicillins Hives    Has patient had a PCN reaction causing immediate rash, facial/tongue/throat swelling, SOB or lightheadedness with hypotension: No Has patient had a PCN reaction causing severe rash involving mucus membranes or skin necrosis: No Has patient had a PCN reaction that required hospitalization: No Has patient had a PCN reaction occurring within the last 10 years: No If all of the above answers are "NO", then may proceed with Cephalosporin use.  . Sulfa Antibiotics Hives  . Tiotropium Bromide Monohydrate Other (See Comments)    Blurry vision   . Tylenol [Acetaminophen] Other (See Comments)    Doesn't work  . Morphine Hives and Rash  .  Pantoprazole Rash    VITALS:  Blood pressure (!) 117/58, pulse 100, temperature 98.1 F (36.7 C), temperature source Oral, resp. rate 18, height 5' (1.524 m), weight 118.1 kg, SpO2 96 %.  PHYSICAL EXAMINATION:  GENERAL:  77 y.o.-year-old patient lying in the bed with no acute distress.  EYES: Pupils equal, round, reactive to light and accommodation. No scleral icterus. Extraocular muscles intact.  HEENT: Head atraumatic, normocephalic. Oropharynx and nasopharynx clear.  NECK:  Supple, no jugular venous distention. No thyroid enlargement, no tenderness.  LUNGS: Normal breath sounds bilaterally, no wheezing, rales,rhonchi or crepitation. No use of accessory muscles of respiration.  CARDIOVASCULAR: S1, S2 normal. No murmurs, rubs, or gallops.  ABDOMEN: Soft, nontender, nondistended. Bowel sounds present. No organomegaly or mass.  EXTREMITIES: No pedal edema, cyanosis, or clubbing.  NEUROLOGIC: Cranial nerves II through XII are intact. Muscle strength 5/5 in all extremities. Sensation intact. Gait not checked.  PSYCHIATRIC: The patient is alert and oriented x 3.  SKIN: No obvious rash, lesion, or ulcer.   Physical Exam LABORATORY PANEL:   CBC Recent Labs  Lab 02/22/18 0544  WBC 6.4  HGB 12.7  HCT 37.9  PLT 145*   ------------------------------------------------------------------------------------------------------------------  Chemistries  Recent Labs  Lab 02/19/18 1322 02/20/18 0506  NA 140 139  K 4.2 5.1  CL 102 102  CO2 31 30  GLUCOSE 100* 94  BUN 15 15  CREATININE 0.74 0.82  CALCIUM 9.1 8.4*  AST 18  --   ALT 12  --   ALKPHOS 55  --   BILITOT 0.6  --    ------------------------------------------------------------------------------------------------------------------  Cardiac Enzymes Recent Labs  Lab 02/19/18 1322  TROPONINI <0.03    ------------------------------------------------------------------------------------------------------------------  RADIOLOGY:  No results found.  ASSESSMENT AND PLAN:  *Acute onCOPD exacerbation Resolving Continue prednisone taper, empiric azithromycin for 5-day course, breathing treatments as needed, wean O2 off as tolerated, Trelegy inhaler, will assess for home oxygen, mucolytic agents, physical therapy input appreciated-for skilled nursing facility placement status post discharge  *Acute abdominal pain Resolved Patient continues to deny any abdominal pain/diarrhea-no bowel movement in 4 days Discontinued Cipro/Flagyl/enteric precautions  *New onset atrial fibrillation Stable Continue Eliquis, aspirin 81 mg, cardiology input appreciated-no intervention recommended, echocardiogram from June this year noted for normal ejection fraction/stage I diastolic dysfunction  *Chronic benign essential hypertension Stable Continue current regiment  *Chronic anxiety/depression Stable Continue home psychotropic regiment  *Chronic morbid obesity Most likely secondary to excess calories Lifestyle modification recommended As ago therapy to evaluate/treat Sickle therapy to evaluate/treat  *Chronic restless leg syndrome Stable   Disposition  to skilled nursing facility on tomorrow barring any complications   All the records are reviewed and case discussed with Care Management/Social Workerr. Management plans discussed with the patient, family and they are in agreement.  CODE STATUS: dnr  TOTAL TIME TAKING CARE OF THIS PATIENT: 45 minutes.     POSSIBLE D/C IN 1-2 DAYS, DEPENDING ON CLINICAL CONDITION.   Vicki Morales M.D on 02/22/2018   Between 7am to 6pm - Pager - [phone]  After 6pm go to [email or web address] - password [email or web address] [place] [facility]  Office  [phone]  CC: Primary care physician; Vicki Sons, MD  Note: This dictation was  prepared with Dragon dictation along with smaller phrase technology. Any transcriptional errors that result from this process are unintentional.

## 2018-02-23 LAB — CBC
HEMATOCRIT: 40.4 % (ref 35.0–47.0)
HEMOGLOBIN: 13.6 g/dL (ref 12.0–16.0)
MCH: 31.2 pg (ref 26.0–34.0)
MCHC: 33.7 g/dL (ref 32.0–36.0)
MCV: 92.7 fL (ref 80.0–100.0)
Platelets: 151 10*3/uL (ref 150–440)
RBC: 4.36 MIL/uL (ref 3.80–5.20)
RDW: 15 % — AB (ref 11.5–14.5)
WBC: 4.6 10*3/uL (ref 3.6–11.0)

## 2018-02-23 LAB — BASIC METABOLIC PANEL
ANION GAP: 7 (ref 5–15)
BUN: 41 mg/dL — ABNORMAL HIGH (ref 8–23)
CALCIUM: 9.2 mg/dL (ref 8.9–10.3)
CO2: 32 mmol/L (ref 22–32)
Chloride: 92 mmol/L — ABNORMAL LOW (ref 98–111)
Creatinine, Ser: 0.94 mg/dL (ref 0.44–1.00)
GFR calc Af Amer: >60 mL/min (ref >60)
GFR calc non Af Amer: 57 mL/min — ABNORMAL LOW (ref >60)
GLUCOSE: 132 mg/dL — AB (ref 70–99)
Potassium: 5.8 mmol/L — ABNORMAL HIGH (ref 3.5–5.1)
Sodium: 131 mmol/L — ABNORMAL LOW (ref 135–145)

## 2018-02-23 MED ORDER — PATIROMER SORBITEX CALCIUM 8.4 G PO PACK
8.4000 g | PACK | Freq: Every day | ORAL | Status: DC
  Filled 2018-02-23: qty 1

## 2018-02-23 MED ORDER — SODIUM CHLORIDE 0.9 % IV BOLUS
1000.0000 mL | Freq: Once | INTRAVENOUS | Status: AC
  Administered 2018-02-23: 1000 mL via INTRAVENOUS

## 2018-02-23 MED ORDER — PATIROMER SORBITEX CALCIUM 8.4 G PO PACK
8.4000 g | PACK | Freq: Every day | ORAL | Status: DC
  Administered 2018-02-23: 8.4 g via ORAL
  Filled 2018-02-23: qty 1

## 2018-02-23 MED ORDER — AMIODARONE HCL 200 MG PO TABS
200.0000 mg | ORAL_TABLET | Freq: Two times a day (BID) | ORAL | Status: DC
  Administered 2018-02-23 – 2018-02-26 (×7): 200 mg via ORAL
  Filled 2018-02-23 (×7): qty 1

## 2018-02-23 MED ORDER — SODIUM CHLORIDE 0.9% FLUSH
3.0000 mL | Freq: Two times a day (BID) | INTRAVENOUS | Status: DC
  Administered 2018-02-23 – 2018-02-26 (×3): 3 mL via INTRAVENOUS

## 2018-02-23 MED ORDER — LOPERAMIDE HCL 2 MG PO CAPS
2.0000 mg | ORAL_CAPSULE | ORAL | Status: DC | PRN
  Administered 2018-02-23 – 2018-02-24 (×2): 2 mg via ORAL
  Filled 2018-02-23 (×2): qty 1

## 2018-02-23 MED ORDER — SODIUM CHLORIDE 0.9 % IV SOLN
INTRAVENOUS | Status: DC
  Administered 2018-02-23 (×2): via INTRAVENOUS

## 2018-02-23 MED ORDER — SODIUM CHLORIDE 0.9% FLUSH
3.0000 mL | Freq: Two times a day (BID) | INTRAVENOUS | Status: DC
  Administered 2018-02-23 – 2018-02-26 (×4): 3 mL via INTRAVENOUS

## 2018-02-23 MED ORDER — SODIUM POLYSTYRENE SULFONATE 15 GM/60ML PO SUSP
15.0000 g | Freq: Once | ORAL | Status: AC
  Administered 2018-02-23: 15 g via ORAL
  Filled 2018-02-23: qty 60

## 2018-02-23 MED ORDER — GABAPENTIN 300 MG PO CAPS
300.0000 mg | ORAL_CAPSULE | Freq: Every day | ORAL | Status: DC
  Administered 2018-02-23 – 2018-02-25 (×3): 300 mg via ORAL
  Filled 2018-02-23 (×3): qty 1

## 2018-02-23 NOTE — Progress Notes (Signed)
[facility] NAME: Vicki Morales    MR#:  [id number]  DATE OF BIRTH:  Sep 01, 1940  SUBJECTIVE:  CHIEF COMPLAINT:   Chief Complaint  Patient presents with  . Abdominal Pain  Patient complains of lightheadedness, blood pressure noted to be in the 80s, lab work noted for hyponatremia, hyperkalemia, hypochloremia, will rehydrate with IV fluids gently, patient requesting Imodium, will hold discharge for today, discontinue Lotensin/Cardizem, start amiodarone  REVIEW OF SYSTEMS:  CONSTITUTIONAL: No fever, fatigue or weakness.  EYES: No blurred or double vision.  EARS, NOSE, AND THROAT: No tinnitus or ear pain.  RESPIRATORY: No cough, shortness of breath, wheezing or hemoptysis.  CARDIOVASCULAR: No chest pain, orthopnea, edema.  GASTROINTESTINAL: No nausea, vomiting, diarrhea or abdominal pain.  GENITOURINARY: No dysuria, hematuria.  ENDOCRINE: No polyuria, nocturia,  HEMATOLOGY: No anemia, easy bruising or bleeding SKIN: No rash or lesion. MUSCULOSKELETAL: No joint pain or arthritis.   NEUROLOGIC: No tingling, numbness, weakness.  PSYCHIATRY: No anxiety or depression.   ROS  DRUG ALLERGIES:   Allergies  Allergen Reactions  . Arthrotec [Diclofenac-Misoprostol] Hives  . Codeine Other (See Comments)    Headache   . Diclofenac Other (See Comments) and Hives    Pt unsure allergy  . Hydrochlorothiazide Other (See Comments)    Weakness   . Penicillins Hives    Has patient had a PCN reaction causing immediate rash, facial/tongue/throat swelling, SOB or lightheadedness with hypotension: No Has patient had a PCN reaction causing severe rash involving mucus membranes or skin necrosis: No Has patient had a PCN reaction that required hospitalization: No Has patient had a PCN reaction occurring within the last 10 years: No If all of the above answers are "NO", then may proceed with Cephalosporin use.  . Sulfa Antibiotics Hives  . Tiotropium  Bromide Monohydrate Other (See Comments)    Blurry vision   . Tylenol [Acetaminophen] Other (See Comments)    Doesn't work  . Morphine Hives and Rash  . Pantoprazole Rash    VITALS:  Blood pressure (!) 98/35, pulse 72, temperature 97.8 F (36.6 C), temperature source Oral, resp. rate 12, height 5' (1.524 m), weight 118.1 kg, SpO2 97 %.  PHYSICAL EXAMINATION:  GENERAL:  77 y.o.-year-old patient lying in the bed with no acute distress.  EYES: Pupils equal, round, reactive to light and accommodation. No scleral icterus. Extraocular muscles intact.  HEENT: Head atraumatic, normocephalic. Oropharynx and nasopharynx clear.  NECK:  Supple, no jugular venous distention. No thyroid enlargement, no tenderness.  LUNGS: Normal breath sounds bilaterally, no wheezing, rales,rhonchi or crepitation. No use of accessory muscles of respiration.  CARDIOVASCULAR: S1, S2 normal. No murmurs, rubs, or gallops.  ABDOMEN: Soft, nontender, nondistended. Bowel sounds present. No organomegaly or mass.  EXTREMITIES: No pedal edema, cyanosis, or clubbing.  NEUROLOGIC: Cranial nerves II through XII are intact. Muscle strength 5/5 in all extremities. Sensation intact. Gait not checked.  PSYCHIATRIC: The patient is alert and oriented x 3.  SKIN: No obvious rash, lesion, or ulcer.   Physical Exam LABORATORY PANEL:   CBC Recent Labs  Lab 02/23/18 0852  WBC 4.6  HGB 13.6  HCT 40.4  PLT 151   ------------------------------------------------------------------------------------------------------------------  Chemistries  Recent Labs  Lab 02/19/18 1322  02/23/18 0852  NA 140   < > 131*  K 4.2   < > 5.8*  CL 102   < > 92*  CO2 31   < > 32  GLUCOSE 100*   < >  132*  BUN 15   < > 41*  CREATININE 0.74   < > 0.94  CALCIUM 9.1   < > 9.2  AST 18  --   --   ALT 12  --   --   ALKPHOS 55  --   --   BILITOT 0.6  --   --    < > = values in this interval not displayed.    ------------------------------------------------------------------------------------------------------------------  Cardiac Enzymes Recent Labs  Lab 02/19/18 1322  TROPONINI <0.03   ------------------------------------------------------------------------------------------------------------------  RADIOLOGY:  No results found.  ASSESSMENT AND PLAN:  *Acute onCOPD exacerbation Resolving Continue prednisone taper, empiric azithromycin for 5-day course, breathing treatments as needed, wean O2 off as tolerated,Trelegy inhaler, mucolytic agents, physical therapy input appreciated-for skilled nursing facility placement status post discharge, and wean O2 off as tolerated-may require home oxygen  *Acute hypotension Most likely due to intolerance of Cardizem recently started for new onset A. fib Discontinue Lotensin/Cardizem  IV fluids for rehydration  *Acute hyperkalemia Veltassa daily for 2 doses, Kayexalate x1, IV fluids for rehydration, BMP in the morning  *Acute abdominal pain Resolved Patient continues to deny any abdominal pain/diarrhea-no bowel movement in 4 days Discontinued Cipro/Flagyl/enteric precautions  *New onset atrial fibrillation Stable Continue Eliquis,aspirin 81 mg, Cardizem had to be discontinued due to hypotension, start amiodarone twice daily, cardiologyinput appreciated-no intervention recommended, echocardiogram from June this year noted for normal ejection fraction/stage I diastolic dysfunction  *Chronic benign essential hypertension Currently hypotensive Plan of care as stated above  *Chronic anxiety/depression Stable Continue home psychotropic regiment  *Chronic morbid obesity Most likely secondary to excess calories Lifestyle modification recommended As ago therapy to evaluate/treat Sickle therapy to evaluate/treat  *Chronic restless leg syndrome Stable   Disposition to skilled nursing facility on tomorrow barring any complications    All the records are reviewed and case discussed with Care Management/Social Workerr. Management plans discussed with the patient, family and they are in agreement.  CODE STATUS: dnr  TOTAL TIME TAKING CARE OF THIS PATIENT: 40 minutes.     POSSIBLE D/C IN 1-2 DAYS, DEPENDING ON CLINICAL CONDITION.   Avel Peace Salary M.D on 02/23/2018   Between 7am to 6pm - Pager - [phone]  After 6pm go to [email or web address] - password [email or web address]  Office  [phone]  CC: Primary care physician; Birdie Sons, MD  Note: This dictation was prepared with Dragon dictation along with smaller phrase technology. Any transcriptional errors that result from this process are unintentional.

## 2018-02-23 NOTE — Progress Notes (Signed)
PT Cancellation Note  Patient Details Name: Vicki Morales MRN: [id number] DOB: 22-Nov-1940   Cancelled Treatment:    Reason Eval/Treat Not Completed: Medical issues which prohibited therapy   Chart reviewed.  Per nursing notes BP 84/56 and dizzy.  Will hold session this am and continue as appropriate.  Will try to reschedule this pm as able.   Chesley Noon 02/23/2018, 9:15 AM

## 2018-02-23 NOTE — Progress Notes (Signed)
BP 84/56.  Patient sitting full upright in bed. States she feels whoozy.  Lowered head to 30 degrees.  Dr. Jerelyn Charles notified.

## 2018-02-23 NOTE — Plan of Care (Signed)
  Problem: Health Behavior/Discharge Planning: Goal: Ability to manage health-related needs will improve Outcome: Progressing   Problem: Clinical Measurements: Goal: Ability to maintain clinical measurements within normal limits will improve Outcome: Progressing Goal: Will remain free from infection Outcome: Progressing Goal: Diagnostic test results will improve Outcome: Progressing Goal: Respiratory complications will improve Outcome: Progressing Goal: Cardiovascular complication will be avoided Outcome: Progressing   Problem: Activity: Goal: Risk for activity intolerance will decrease Outcome: Progressing   Problem: Nutrition: Goal: Adequate nutrition will be maintained Outcome: Progressing   Problem: Coping: Goal: Level of anxiety will decrease Outcome: Progressing   Problem: Elimination: Goal: Will not experience complications related to bowel motility Outcome: Progressing Goal: Will not experience complications related to urinary retention Outcome: Progressing   Problem: Pain Managment: Goal: General experience of comfort will improve Outcome: Progressing   Problem: Safety: Goal: Ability to remain free from injury will improve Outcome: Progressing   Problem: Skin Integrity: Goal: Risk for impaired skin integrity will decrease Outcome: Progressing   Problem: Education: Goal: Knowledge of disease or condition will improve Outcome: Progressing Goal: Knowledge of the prescribed therapeutic regimen will improve Outcome: Progressing Goal: Individualized Educational Video(s) Outcome: Progressing   Problem: Activity: Goal: Ability to tolerate increased activity will improve Outcome: Progressing Goal: Will verbalize the importance of balancing activity with adequate rest periods Outcome: Progressing   Problem: Respiratory: Goal: Ability to maintain a clear airway will improve Outcome: Progressing Goal: Levels of oxygenation will improve Outcome:  Progressing Goal: Ability to maintain adequate ventilation will improve Outcome: Progressing

## 2018-02-23 NOTE — Clinical Social Work Note (Addendum)
CSW spoke with Vicki Morales, and they informed CSW that patient's insurance company is managed by [facility] and facility has to get authorization.  CSW will contact facility to begin insurance authorization.  CSW spoke with patient's daughter Vicki Morales [phone], they have chosen [facility] they will start insurance authorization.  Jones Broom. Celina, MSW, [place]  02/23/2018 9:49 AM

## 2018-02-23 NOTE — Progress Notes (Signed)
Physical Therapy Treatment Patient Details Name: Vicki Morales MRN: [id number] DOB: 1940-08-04 Today's Date: 02/23/2018    History of Present Illness Pt is a pleasant 77 y.o female presenting with COPD exacerbation, abdominal pain, and new onset afib. PMH significant for COPD, paranoid disorder, dyspnea, and dysrythmia. After most recent hospital stay in July, pt spent 4 weeks in rehab and following discharge received HHPt.     PT Comments    Chart reviewed.  BP upon arrival 98/35 supine with HOB up arrox 30 degrees.  Pt wanting to try therapy this am.  Pt reports general comfort in supine.  Participated in exercises as described below.  During exercises, HR and O2 monitored.  At one point during SLR HR increased to 127.  While she demonstrated the ability to complete all ex without assist, fatigue was noted.  BP taken after exercises 96/48.  Further mobility limited due to HR and BP concerns.   Follow Up Recommendations  SNF     Equipment Recommendations       Recommendations for Other Services       Precautions / Restrictions Precautions Precautions: Fall Restrictions Weight Bearing Restrictions: No    Mobility  Bed Mobility               General bed mobility comments: deferred due to to BP/HR  Transfers                 General transfer comment: Unable to assess due to fatigue and medical concerns.   Ambulation/Gait             General Gait Details: Unable to assess due to fatigue and medical concerns.    Stairs             Wheelchair Mobility    Modified Rankin (Stroke Patients Only)       Balance                                            Cognition Arousal/Alertness: Awake/alert Behavior During Therapy: WFL for tasks assessed/performed Overall Cognitive Status: Within Functional Limits for tasks assessed                                        Exercises Other Exercises Other Exercises:  supine ankle pumps, quad sets, glut sets, slr and heel slides x 10 BLE    General Comments        Pertinent Vitals/Pain Pain Assessment: No/denies pain    Home Living                      Prior Function            PT Goals (current goals can now be found in the care plan section) Progress towards PT goals: Progressing toward goals    Frequency    Min 2X/week      PT Plan Current plan remains appropriate    Co-evaluation              AM-PAC PT "6 Clicks" Daily Activity  Outcome Measure  Difficulty turning over in bed (including adjusting bedclothes, sheets and blankets)?: A Little Difficulty moving from lying on back to sitting on the side of the bed? : A Little Difficulty sitting down on and  standing up from a chair with arms (e.g., wheelchair, bedside commode, etc,.)?: A Little Help needed moving to and from a bed to chair (including a wheelchair)?: A Lot Help needed walking in hospital room?: A Lot Help needed climbing 3-5 steps with a railing? : Total 6 Click Score: 14    End of Session Equipment Utilized During Treatment: Oxygen Activity Tolerance: Patient limited by fatigue;Treatment limited secondary to medical complications (Comment) Patient left: in bed;with bed alarm set;with call bell/phone within reach Nurse Communication: Other (comment)       Time: 6222-9798 PT Time Calculation (min) (ACUTE ONLY): 19 min  Charges:  $Therapeutic Exercise: 8-22 mins                     Chesley Noon, PTA 02/23/18, 11:24 AM

## 2018-02-23 NOTE — Plan of Care (Signed)
Weaned from oxygen

## 2018-02-24 LAB — BASIC METABOLIC PANEL
Anion gap: 6 (ref 5–15)
BUN: 35 mg/dL — AB (ref 8–23)
CALCIUM: 8.5 mg/dL — AB (ref 8.9–10.3)
CHLORIDE: 96 mmol/L — AB (ref 98–111)
CO2: 30 mmol/L (ref 22–32)
CREATININE: 0.74 mg/dL (ref 0.44–1.00)
GFR calc non Af Amer: >60 mL/min (ref >60)
Glucose, Bld: 113 mg/dL — ABNORMAL HIGH (ref 70–99)
Potassium: 5.3 mmol/L — ABNORMAL HIGH (ref 3.5–5.1)
Sodium: 132 mmol/L — ABNORMAL LOW (ref 135–145)

## 2018-02-24 MED ORDER — IBUPROFEN 400 MG PO TABS
400.0000 mg | ORAL_TABLET | Freq: Four times a day (QID) | ORAL | Status: DC | PRN
  Administered 2018-02-24: 400 mg via ORAL
  Filled 2018-02-24: qty 1

## 2018-02-24 MED ORDER — ASPIRIN 81 MG PO TBEC: 81.0000 mg | DELAYED_RELEASE_TABLET | Freq: Every day | ORAL | 0 refills | Status: DC

## 2018-02-24 MED ORDER — TRAMADOL HCL 50 MG PO TABS: 50.0000 mg | ORAL_TABLET | Freq: Two times a day (BID) | ORAL | 0 refills | Status: DC | PRN

## 2018-02-24 MED ORDER — LOPERAMIDE HCL 2 MG PO CAPS: 2.0000 mg | ORAL_CAPSULE | ORAL | 0 refills | Status: DC | PRN

## 2018-02-24 MED ORDER — DIGOXIN 125 MCG PO TABS: 0.0625 mg | ORAL_TABLET | Freq: Every day | ORAL | 0 refills | Status: DC

## 2018-02-24 MED ORDER — APIXABAN 5 MG PO TABS: 5.0000 mg | ORAL_TABLET | Freq: Two times a day (BID) | ORAL | 0 refills | Status: DC

## 2018-02-24 MED ORDER — SODIUM POLYSTYRENE SULFONATE 15 GM/60ML PO SUSP
30.0000 g | Freq: Once | ORAL | Status: AC
  Administered 2018-02-24: 30 g via ORAL
  Filled 2018-02-24: qty 120

## 2018-02-24 MED ORDER — PATIROMER SORBITEX CALCIUM 8.4 G PO PACK
8.4000 g | PACK | Freq: Every day | ORAL | Status: DC
  Administered 2018-02-25: 8.4 g via ORAL
  Filled 2018-02-24 (×3): qty 1

## 2018-02-24 MED ORDER — AZITHROMYCIN 250 MG PO TABS: ORAL_TABLET | ORAL | 0 refills | Status: DC

## 2018-02-24 MED ORDER — GUAIFENESIN ER 600 MG PO TB12: 600.0000 mg | ORAL_TABLET | Freq: Two times a day (BID) | ORAL | 0 refills | Status: DC

## 2018-02-24 MED ORDER — KETOROLAC TROMETHAMINE 15 MG/ML IJ SOLN: 15.0000 mg | Freq: Once | INTRAMUSCULAR | Status: DC

## 2018-02-24 MED ORDER — RISAQUAD PO CAPS: 1.0000 | ORAL_CAPSULE | Freq: Three times a day (TID) | ORAL | 0 refills | Status: DC

## 2018-02-24 MED ORDER — ENSURE ENLIVE PO LIQD: 237.0000 mL | Freq: Two times a day (BID) | ORAL | 0 refills | Status: DC

## 2018-02-24 MED ORDER — PREDNISONE 10 MG (21) PO TBPK: ORAL_TABLET | ORAL | 0 refills | Status: DC

## 2018-02-24 MED ORDER — DIGOXIN 125 MCG PO TABS
0.1250 mg | ORAL_TABLET | Freq: Every day | ORAL | Status: DC
Start: 2018-02-24 — End: 2018-02-26
  Administered 2018-02-24 – 2018-02-26 (×3): 0.125 mg via ORAL
  Filled 2018-02-24 (×3): qty 1

## 2018-02-24 MED ORDER — INFLUENZA VAC SPLIT HIGH-DOSE 0.5 ML IM SUSY
0.5000 mL | PREFILLED_SYRINGE | Freq: Once | INTRAMUSCULAR | Status: AC
  Administered 2018-02-24: 0.5 mL via INTRAMUSCULAR
  Filled 2018-02-24: qty 0.5

## 2018-02-24 MED ORDER — PATIROMER SORBITEX CALCIUM 8.4 G PO PACK: 8.4000 g | PACK | Freq: Every day | ORAL | 0 refills | Status: DC

## 2018-02-24 MED ORDER — AMIODARONE HCL 200 MG PO TABS: 200.0000 mg | ORAL_TABLET | Freq: Two times a day (BID) | ORAL | 0 refills | Status: DC

## 2018-02-24 MED ORDER — DIGOXIN 0.25 MG/ML IJ SOLN
0.2500 mg | Freq: Once | INTRAMUSCULAR | Status: AC
  Administered 2018-02-24: 0.25 mg via INTRAVENOUS
  Filled 2018-02-24: qty 2

## 2018-02-24 NOTE — Progress Notes (Signed)
Patient is medically stable for D/C to [facility] today. Per [facility] admissions coordinator at [facility] SNF authorization has been received and patient can come today. RN will call report and arrange EMS for transport. Clinical Education officer, museum (CSW) sent D/C orders to [facility] today. Patient is aware of above. Patient's daughter [name] is aware of above. Please reconsult if future social work needs arise. CSW signing off.   [employer], LCSW [phone]

## 2018-02-24 NOTE — Care Management Important Message (Signed)
Copy of signed IM left with patient in room.  

## 2018-02-24 NOTE — Discharge Summary (Addendum)
[facility] [place] NAME: Vicki Morales    MR#:  [id number]  DATE OF BIRTH:  January 23, 1941  DATE OF ADMISSION:  02/19/2018 ADMITTING PHYSICIAN: Loletha Grayer, MD  DATE OF DISCHARGE: 02/26/2018  PRIMARY CARE PHYSICIAN: Birdie Sons, MD    ADMISSION DIAGNOSIS:  Hypoxia [R09.02] COPD exacerbation ([place]) [J44.1] Abdominal pain, unspecified abdominal location [R10.9]  DISCHARGE DIAGNOSIS:  Active Problems:   COPD exacerbation ([place])   SECONDARY DIAGNOSIS:   Past Medical History:  Diagnosis Date  . Anxiety   . Arthritis   . Cataract    right eye  . COPD (chronic obstructive pulmonary disease) ([place])   . Depression   . Dyspnea    DOE  . Dysrhythmia   . Edema    FEET/ANKLES  . GERD (gastroesophageal reflux disease)   . H/O wheezing   . History of hiatal hernia   . HOH (hard of hearing)   . Hypertension   . Incontinence of urine in female   . Orthopnea   . Pain    CHRONIC KNEE  . Pain    CHRONIC KNEE  . Paranoid disorder ([place])   . RLS (restless legs syndrome)   . Tremors of nervous system    HANDS    HOSPITAL COURSE:   77 year old female with past medical history of COPD, hypertension, restless leg syndrome, depression, GERD, anxiety, osteoarthritis, GERD who presented to the hospital due to shortness of breath and noted to be in COPD exacerbation.  1.  COPD exacerbation -patient was treated with IV steroids, scheduled duo nebs, Pulmicort nebs and has significantly improved.  She has no further significant wheezing or bronchospasm. -She is being discharged on her maintenance inhalers, prednisone taper and empiric Zithromax.  2.  Atrial fibrillation-new onset for the patient.   Her heart rates have improved now on digoxin, low-dose amiodarone and Cardizem. -She is being discharged on those meds along with Eliquis for anticoagulation.  She will follow-up with cardiology next 1-2 weeks.   3.  Hyperkalemia-  patient is being discharged on some Veltassa and her Potassium level has improved.   4.  Neuropathy- she will continue gabapentin.  5.  Anxiety/depression- she will continue Ativan, trazodone, Buspar.  6.  Urinary incontinence-she will continue oxybutynin.  Pt. Is being discharged to a skilled nursing facility for further care.     DISCHARGE CONDITIONS:   stable  CONSULTS OBTAINED:  Treatment Team:  Yolonda Kida, MD Teodoro Spray, MD  DRUG ALLERGIES:   Allergies  Allergen Reactions  . Arthrotec [Diclofenac-Misoprostol] Hives  . Codeine Other (See Comments)    Headache   . Diclofenac Other (See Comments) and Hives    Pt unsure allergy  . Hydrochlorothiazide Other (See Comments)    Weakness   . Penicillins Hives    Has patient had a PCN reaction causing immediate rash, facial/tongue/throat swelling, SOB or lightheadedness with hypotension: No Has patient had a PCN reaction causing severe rash involving mucus membranes or skin necrosis: No Has patient had a PCN reaction that required hospitalization: No Has patient had a PCN reaction occurring within the last 10 years: No If all of the above answers are "NO", then may proceed with Cephalosporin use.  . Sulfa Antibiotics Hives  . Tiotropium Bromide Monohydrate Other (See Comments)    Blurry vision   . Tylenol [Acetaminophen] Other (See Comments)    Doesn't work  . Morphine Hives and Rash  . Pantoprazole Rash  DISCHARGE MEDICATIONS:   Allergies as of 02/26/2018      Reactions   Arthrotec [diclofenac-misoprostol] Hives   Codeine Other (See Comments)   Headache    Diclofenac Other (See Comments), Hives   Pt unsure allergy   Hydrochlorothiazide Other (See Comments)   Weakness    Penicillins Hives   Has patient had a PCN reaction causing immediate rash, facial/tongue/throat swelling, SOB or lightheadedness with hypotension: No Has patient had a PCN reaction causing severe rash involving mucus membranes or  skin necrosis: No Has patient had a PCN reaction that required hospitalization: No Has patient had a PCN reaction occurring within the last 10 years: No If all of the above answers are "NO", then may proceed with Cephalosporin use.   Sulfa Antibiotics Hives   Tiotropium Bromide Monohydrate Other (See Comments)   Blurry vision    Tylenol [acetaminophen] Other (See Comments)   Doesn't work   Morphine Hives, Rash   Pantoprazole Rash      Medication List    STOP taking these medications   benazepril 5 MG tablet Commonly known as:  LOTENSIN   furosemide 20 MG tablet Commonly known as:  LASIX     TAKE these medications   acidophilus Caps capsule Take 1 capsule by mouth 3 (three) times daily with meals.   albuterol (2.5 MG/3ML) 0.083% nebulizer solution Commonly known as:  PROVENTIL Take 3 mLs (2.5 mg total) by nebulization 2 (two) times daily as needed for wheezing or shortness of breath. Notes to patient:  LAST DOSE WAS THIS MORNING   amiodarone 200 MG tablet Commonly known as:  PACERONE Take 1 tablet (200 mg total) by mouth 2 (two) times daily.   apixaban 5 MG Tabs tablet Commonly known as:  ELIQUIS Take 1 tablet (5 mg total) by mouth 2 (two) times daily.   aspirin 81 MG EC tablet Take 1 tablet (81 mg total) by mouth daily. What changed:    medication strength  how much to take   azithromycin 250 MG tablet Commonly known as:  ZITHROMAX 1 po daily   busPIRone 10 MG tablet Commonly known as:  BUSPAR Take 1 tablet (10 mg total) by mouth 3 (three) times daily. What changed:  when to take this   CALCIUM 600+D 600-400 MG-UNIT tablet Generic drug:  Calcium Carbonate-Vitamin D Take 1 tablet by mouth 2 (two) times daily.   Cranberry 250 MG Caps Take 1 capsule by mouth daily.   cyanocobalamin 1000 MCG tablet Take 1,000 mcg by mouth daily.   digoxin 0.125 MG tablet Commonly known as:  LANOXIN Take 0.5 tablets (0.0625 mg total) by mouth daily.   diltiazem 120 MG  24 hr capsule Commonly known as:  CARDIZEM CD Take 1 capsule (120 mg total) by mouth daily.   divalproex 250 MG DR tablet Commonly known as:  DEPAKOTE Take 1 tablet (250 mg total) by mouth 2 (two) times daily.   feeding supplement (ENSURE ENLIVE) Liqd Take 237 mLs by mouth 2 (two) times daily between meals.   Fish Oil 1000 MG Caps Take 1,000 mg by mouth daily.   fluticasone 50 MCG/ACT nasal spray Commonly known as:  FLONASE Place 1 spray into both nostrils daily.   Fluticasone-Umeclidin-Vilant 100-62.5-25 MCG/INH Aepb Inhale 1 puff into the lungs daily.   gabapentin 100 MG capsule Commonly known as:  NEURONTIN TAKE 1 CAPSULE(100 MG) BY MOUTH TWICE DAILY What changed:  See the new instructions.   gabapentin 300 MG capsule Commonly known as:  NEURONTIN  TAKE 2 CAPSULES(600 MG) BY MOUTH AT BEDTIME What changed:  See the new instructions.   guaiFENesin 600 MG 12 hr tablet Commonly known as:  MUCINEX Take 1 tablet (600 mg total) by mouth 2 (two) times daily.   loperamide 2 MG capsule Commonly known as:  IMODIUM Take 1 capsule (2 mg total) by mouth as needed for diarrhea or loose stools.   loratadine 10 MG tablet Commonly known as:  CLARITIN Take 10 mg by mouth daily.   magnesium oxide 400 MG tablet Commonly known as:  MAG-OX Take 400 mg by mouth 2 (two) times daily.   Melatonin 10 MG Caps Take 10 mg by mouth at bedtime as needed (sleep).   meloxicam 15 MG tablet Commonly known as:  MOBIC Take 15 mg by mouth daily.   montelukast 10 MG tablet Commonly known as:  SINGULAIR TAKE 1 TABLET(10 MG) BY MOUTH EVERY NIGHT What changed:  See the new instructions.   multivitamin with minerals Tabs tablet Take 1 tablet by mouth daily.   NON FORMULARY Take 2 tablets by mouth 2 (two) times daily. Kyolic OTC for cholesterol   oxybutynin 10 MG 24 hr tablet Commonly known as:  DITROPAN-XL TAKE 1 TABLET BY MOUTH AT BEDTIME What changed:  when to take this   patiromer 8.4 g  packet Commonly known as:  VELTASSA Take 1 packet (8.4 g total) by mouth daily.   predniSONE 10 MG (21) Tbpk tablet Commonly known as:  STERAPRED UNI-PAK 21 TAB Take as directed Notes to patient:  TAPER PACK SENT WITH PATIENT   Probiotic 250 MG Caps Take 1 capsule by mouth 3 times/day as needed-between meals & bedtime. What changed:  when to take this   risperiDONE 1 MG tablet Commonly known as:  RISPERDAL Take 1 tablet (1 mg total) by mouth at bedtime.   theophylline 200 MG 24 hr capsule Commonly known as:  THEO-24 Take 1 capsule (200 mg total) by mouth daily.   tiZANidine 4 MG tablet Commonly known as:  ZANAFLEX Take 1 tablet (4 mg total) by mouth every 8 (eight) hours as needed for muscle spasms. Notes to patient:  NONE GIVEN TODAY   traMADol 50 MG tablet Commonly known as:  ULTRAM Take 1 tablet (50 mg total) by mouth 2 (two) times daily as needed for moderate pain. What changed:    how much to take  when to take this  reasons to take this Notes to patient:  NONE GIVEN TODAY   traZODone 100 MG tablet Commonly known as:  DESYREL Take 1-2 tablets (100-200 mg total) by mouth at bedtime. What changed:  how much to take        DISCHARGE INSTRUCTIONS:   If you experience worsening of your admission symptoms, develop shortness of breath, life threatening emergency, suicidal or homicidal thoughts you must seek medical attention immediately by calling 911 or calling your MD immediately  if symptoms less severe.  You Must read complete instructions/literature along with all the possible adverse reactions/side effects for all the Medicines you take and that have been prescribed to you. Take any new Medicines after you have completely understood and accept all the possible adverse reactions/side effects.   Please note  You were cared for by a hospitalist during your hospital stay. If you have any questions about your discharge medications or the care you received while  you were in the hospital after you are discharged, you can call the unit and asked to speak with the hospitalist  on call if the hospitalist that took care of you is not available. Once you are discharged, your primary care physician will handle any further medical issues. Please note that NO REFILLS for any discharge medications will be authorized once you are discharged, as it is imperative that you return to your primary care physician (or establish a relationship with a primary care physician if you do not have one) for your aftercare needs so that they can reassess your need for medications and monitor your lab values.    Today   No acute events overnight. HR improved and in the 90's now.  Pt. Is asymptomatic. Will d/c to SNF today.  CHIEF COMPLAINT:   Chief Complaint  Patient presents with  . Abdominal Pain   VITAL SIGNS:  Blood pressure (!) 145/70, pulse 95, temperature (!) 97.5 F (36.4 C), temperature source Oral, resp. rate 18, height 5' (1.524 m), weight 118.1 kg, SpO2 96 %.  I/O:    Intake/Output Summary (Last 24 hours) at 02/26/2018 0902 Last data filed at 02/26/2018 0700 Gross per 24 hour  Intake -  Output 2500 ml  Net -2500 ml    PHYSICAL EXAMINATION:  GENERAL:  77 y.o.-year-old patient lying in the bed with no acute distress.  EYES: Pupils equal, round, reactive to light and accommodation. No scleral icterus. Extraocular muscles intact.  HEENT: Head atraumatic, normocephalic. Oropharynx and nasopharynx clear.  NECK:  Supple, no jugular venous distention. No thyroid enlargement, no tenderness.  LUNGS: Normal breath sounds bilaterally, no wheezing, rales,rhonchi or crepitation. No use of accessory muscles of respiration.  CARDIOVASCULAR: S1, S2 Irregular. No murmurs, rubs, or gallops.  ABDOMEN: Soft, non-tender, non-distended. Bowel sounds present. No organomegaly or mass.  EXTREMITIES: No pedal edema, cyanosis, or clubbing.  NEUROLOGIC: Cranial nerves II through XII  are intact. No focal motor or sensory deficits b/l.   PSYCHIATRIC: The patient is alert and oriented x 3.  SKIN: No obvious rash, lesion, or ulcer.   DATA REVIEW:   CBC Recent Labs  Lab 02/23/18 0852  WBC 4.6  HGB 13.6  HCT 40.4  PLT 151    Chemistries  Recent Labs  Lab 02/19/18 1322  02/24/18 0614  NA 140   < > 132*  K 4.2   < > 5.3*  CL 102   < > 96*  CO2 31   < > 30  GLUCOSE 100*   < > 113*  BUN 15   < > 35*  CREATININE 0.74   < > 0.74  CALCIUM 9.1   < > 8.5*  AST 18  --   --   ALT 12  --   --   ALKPHOS 55  --   --   BILITOT 0.6  --   --    < > = values in this interval not displayed.    Cardiac Enzymes Recent Labs  Lab 02/19/18 1322  TROPONINI <0.03    Microbiology Results  Results for orders placed or performed in visit on 01/20/18  Urine Culture     Status: None   Collection Time: 01/20/18  4:09 PM  Result Value Ref Range Status   Urine Culture, Routine Final report  Final   Organism ID, Bacteria Comment  Final    Comment: Mixed urogenital flora 10,000-25,000 colony forming units per mL     RADIOLOGY:  No results found.  EKG:   Orders placed or performed during the hospital encounter of 02/19/18  . EKG 12-Lead  . EKG  12-Lead  . EKG      Management plans discussed with the patient, family and they are in agreement.  CODE STATUS:     Code Status Orders  (From admission, onward)         Start     Ordered   02/19/18 1709  Do not attempt resuscitation (DNR)  Continuous    Question Answer Comment  In the event of cardiac or respiratory ARREST Do not call a "code blue"   In the event of cardiac or respiratory ARREST Do not perform Intubation, CPR, defibrillation or ACLS   In the event of cardiac or respiratory ARREST Use medication by any route, position, wound care, and other measures to relive pain and suffering. May use oxygen, suction and manual treatment of airway obstruction as needed for comfort.   Comments nurse may pronounce       02/19/18 1709          TOTAL TIME TAKING CARE OF THIS PATIENT: 40 minutes.    Henreitta Leber M.D on 02/26/2018 at 9:02 AM  Between 7am to 6pm - Pager - [phone]  After 6pm go to [email or web address] - password [email or web address] [facility]  Office  [phone]  CC: Primary care physician; Birdie Sons, MD   Note: This dictation was prepared with Dragon dictation along with smaller phrase technology. Any transcriptional errors that result from this process are unintentional.

## 2018-02-24 NOTE — Progress Notes (Signed)
I agree with the nursing student's assessment or have made corrections

## 2018-02-24 NOTE — Progress Notes (Signed)
I agree with the nursing students assessment or have made corrections

## 2018-02-24 NOTE — Clinical Social Work Placement (Signed)
   CLINICAL SOCIAL WORK PLACEMENT  NOTE  Date:  02/24/2018  Patient Details  Name: Vicki Morales MRN: [id number] Date of Birth: September 09, 1940  Clinical Social Work is seeking post-discharge placement for this patient at the [place] level of care (*CSW will initial, date and re-position this form in  chart as items are completed):  Yes   Patient/family provided with [facility] Work Department's list of facilities offering this level of care within the geographic area requested by the patient (or if unable, by the patient's family).  Yes   Patient/family informed of their freedom to choose among providers that offer the needed level of care, that participate in Medicare, Medicaid or managed care program needed by the patient, have an available bed and are willing to accept the patient.  Yes   Patient/family informed of [facility]'s ownership interest in [facility] and [facility], as well as of the fact that they are under no obligation to receive care at these facilities.  PASRR submitted to EDS on       PASRR number received on       Existing PASRR number confirmed on 02/22/18     FL2 transmitted to all facilities in geographic area requested by pt/family on 02/22/18     FL2 transmitted to all facilities within larger geographic area on       Patient informed that his/her managed care company has contracts with or will negotiate with certain facilities, including the following:        Yes   Patient/family informed of bed offers received.  Patient chooses bed at [facility] )     Physician recommends and patient chooses bed at      Patient to be transferred to [facility] ) on 02/24/18.  Patient to be transferred to facility by [facility] )     Patient family notified on 02/24/18 of transfer.  Name of family member notified:  (Patient's daughter [name] is aware of D/C today. )     PHYSICIAN        Additional Comment:    _______________________________________________ Makana Feigel, Veronia Beets, LCSW 02/24/2018, 5:02 PM

## 2018-02-24 NOTE — Plan of Care (Signed)
Has not yet gotten out of bed and ambulated.

## 2018-02-25 LAB — DIGOXIN LEVEL: Digoxin Level: <0.2 ng/mL — ABNORMAL LOW (ref 0.8–2.0)

## 2018-02-25 MED ORDER — LORAZEPAM 1 MG PO TABS
1.0000 mg | ORAL_TABLET | Freq: Every evening | ORAL | Status: DC | PRN
  Administered 2018-02-25 – 2018-02-26 (×2): 1 mg via ORAL
  Filled 2018-02-25 (×2): qty 1

## 2018-02-25 MED ORDER — DILTIAZEM HCL 30 MG PO TABS
30.0000 mg | ORAL_TABLET | Freq: Four times a day (QID) | ORAL | Status: DC
  Administered 2018-02-25 – 2018-02-26 (×4): 30 mg via ORAL
  Filled 2018-02-25 (×4): qty 1

## 2018-02-25 MED ORDER — ALBUTEROL SULFATE (2.5 MG/3ML) 0.083% IN NEBU
2.5000 mg | INHALATION_SOLUTION | Freq: Two times a day (BID) | RESPIRATORY_TRACT | Status: DC
  Administered 2018-02-25 – 2018-02-26 (×2): 2.5 mg via RESPIRATORY_TRACT
  Filled 2018-02-25 (×3): qty 3

## 2018-02-25 NOTE — Progress Notes (Signed)
Patient Name: Vicki Morales Date of Encounter: 02/25/2018  Hospital Problem List     Active Problems:   COPD exacerbation [facility])    Patient Profile     77 year old female with history of COPD, and recent onset atrial fibrillation.  Was treated initially with oxygen, empiric antibiotics and steroids.  Her breathing has improved however atrial fibrillation rate has been remaining fast.  She was treated with amiodarone as well as digoxin.  Her A. fib rate still remains in the 1 teens to 120s.  She is totally asymptomatic from this.  Subjective   She feels great.  She denies any nervousness.  Inpatient Medications    . acidophilus  1 capsule Oral TID WC  . albuterol  2.5 mg Nebulization TID  . amiodarone  200 mg Oral BID  . apixaban  5 mg Oral BID  . aspirin EC  81 mg Oral Daily  . azithromycin  500 mg Oral Daily  . budesonide (PULMICORT) nebulizer solution  0.5 mg Nebulization BID  . busPIRone  10 mg Oral BID  . calcium-vitamin D  1 tablet Oral BID  . digoxin  0.125 mg Oral Daily  . diltiazem  30 mg Oral Q6H  . divalproex  250 mg Oral BID  . feeding supplement (ENSURE ENLIVE)  237 mL Oral BID BM  . fluticasone  1 spray Each Nare Daily  . fluticasone furoate-vilanterol  1 puff Inhalation Daily   And  . umeclidinium bromide  1 puff Inhalation Daily  . gabapentin  100 mg Oral BID  . gabapentin  300 mg Oral QHS  . guaiFENesin  600 mg Oral BID  . lactobacillus  1 g Oral TID WC  . loratadine  10 mg Oral Daily  . magnesium oxide  400 mg Oral BID  . mouth rinse  15 mL Mouth Rinse BID  . montelukast  10 mg Oral QHS  . multivitamin with minerals  1 tablet Oral Daily  . omega-3 acid ethyl esters  1 g Oral Daily  . oxybutynin  10 mg Oral Daily  . patiromer  8.4 g Oral Daily  . predniSONE  10 mg Oral 4X daily taper  . risperiDONE  1 mg Oral QHS  . saccharomyces boulardii  250 mg Oral Daily  . sodium chloride flush  3 mL Intravenous Q12H  . sodium chloride flush  3 mL  Intravenous Q12H  . theophylline  200 mg Oral Daily  . traZODone  150 mg Oral QHS  . cyanocobalamin  1,000 mcg Oral Daily  . vitamin C  250 mg Oral BID    Vital Signs    Vitals:   02/24/18 1720 02/24/18 2029 02/25/18 0421 02/25/18 0843  BP: (!) 149/91 138/89 (!) 146/93 120/70  Pulse: 100 (!) 101 100 (!) 111  Resp:   18 18  Temp: 97.7 F (36.5 C) 98.4 F (36.9 C) 98 F (36.7 C)   TempSrc: Oral Oral Oral   SpO2: 95% 96% 96% 93%  Weight:      Height:        Intake/Output Summary (Last 24 hours) at 02/25/2018 0855 Last data filed at 02/25/2018 0421 Gross per 24 hour  Intake 360 ml  Output 1300 ml  Net -940 ml   Filed Weights   02/19/18 1317 02/19/18 1811 02/20/18 0430  Weight: 115.7 kg 117.9 kg 118.1 kg    Physical Exam    GEN: Well nourished, well developed, in no acute distress.  HEENT: normal.  Neck:  Supple, no JVD, carotid bruits, or masses. Cardiac: Irregular irregular rhythm and rate. No clubbing, cyanosis, edema.  Radials/DP/PT 2+ and equal bilaterally.  Respiratory:  Respirations regular and unlabored, occasional wheeze. GI: Soft, nontender, nondistended, BS + x 4. MS: no deformity or atrophy. Skin: warm and dry, no rash. Neuro:  Strength and sensation are intact. Psych: Normal affect.  Labs    CBC Recent Labs    02/23/18 0852  WBC 4.6  HGB 13.6  HCT 40.4  MCV 92.7  PLT 761   Basic Metabolic Panel Recent Labs    02/23/18 0852 02/24/18 0614  NA 131* 132*  K 5.8* 5.3*  CL 92* 96*  CO2 32 30  GLUCOSE 132* 113*  BUN 41* 35*  CREATININE 0.94 0.74  CALCIUM 9.2 8.5*   Liver Function Tests No results for input(s): AST, ALT, ALKPHOS, BILITOT, PROT, ALBUMIN in the last 72 hours. No results for input(s): LIPASE, AMYLASE in the last 72 hours. Cardiac Enzymes No results for input(s): CKTOTAL, CKMB, CKMBINDEX, TROPONINI in the last 72 hours. BNP No results for input(s): BNP in the last 72 hours. D-Dimer No results for input(s): DDIMER in the last  72 hours. Hemoglobin A1C No results for input(s): HGBA1C in the last 72 hours. Fasting Lipid Panel No results for input(s): CHOL, HDL, LDLCALC, TRIG, CHOLHDL, LDLDIRECT in the last 72 hours. Thyroid Function Tests No results for input(s): TSH, T4TOTAL, T3FREE, THYROIDAB in the last 72 hours.  Invalid input(s): FREET3  Telemetry    Atrial fibrillation with fairly rapid ventricular response  ECG    Atrial fibrillation.  No ischemia.  Radiology    Dg Chest 1 View  Result Date: 02/19/2018 CLINICAL DATA:  Diverticulitis EXAM: CHEST  1 VIEW COMPARISON:  12/11/2017 FINDINGS: Lungs are essentially clear.  No focal consolidation. Cardiomegaly. IMPRESSION: No evidence of acute cardiopulmonary disease. Electronically Signed   By: Julian Hy M.D.   On: 02/19/2018 13:45   Ct Abdomen Pelvis W Contrast  Result Date: 02/19/2018 CLINICAL DATA:  Followup diverticulitis. Patient has persistent abdominal pain. EXAM: CT ABDOMEN AND PELVIS WITH CONTRAST TECHNIQUE: Multidetector CT imaging of the abdomen and pelvis was performed using the standard protocol following bolus administration of intravenous contrast. CONTRAST:  135mL ISOVUE-300 IOPAMIDOL (ISOVUE-300) INJECTION 61% COMPARISON:  02/09/2018 FINDINGS: Lower chest: The lung bases are grossly clear. The heart is mildly enlarged but stable. No pericardial effusion. Hepatobiliary: No focal hepatic lesions or intrahepatic ductal dilatation. The gallbladder appears normal. No common bile duct dilatation. Pancreas: No mass, inflammation or ductal dilatation. Stable fatty changes. Spleen: Numerous scattered calcified granulomas but no mass or enlargement. Adrenals/Urinary Tract: Adrenal glands and kidneys are normal. No renal, ureteral or bladder calculi or mass. Stomach/Bowel: The stomach, duodenum, small bowel and colon are unremarkable. No acute inflammatory changes, mass lesions or obstructive findings. The terminal ileum is normal. The appendix is  surgically absent. Significant sigmoid colon diverticulosis but no findings for acute diverticulitis. Vascular/Lymphatic: Stable advanced atherosclerotic calcifications involving the aorta and iliac arteries. No aneurysm or dissection. The branch vessels are patent. The major venous structures are patent. No mesenteric or retroperitoneal mass or adenopathy. Small scattered lymph nodes are stable. Reproductive: Surgically absent. Other: No pelvic mass or adenopathy. No free pelvic fluid collections. No inguinal mass or adenopathy. No abdominal wall hernia or subcutaneous lesions. Small stable periumbilical abdominal wall hernia containing fat. Musculoskeletal: No significant bony findings. Stable degenerative changes involving the spine. IMPRESSION: 1. No CT findings to suggest acute diverticulitis. Stable significant  sigmoid colon diverticulosis. 2. No acute abdominal/pelvic findings, mass lesions or adenopathy. Electronically Signed   By: Marijo Sanes M.D.   On: 02/19/2018 14:44   Ct Abdomen Pelvis W Contrast  Result Date: 02/09/2018 CLINICAL DATA:  Abdominal distension and pain, nausea, vomiting, question diverticulitis, history COPD, GERD, hypertension, asthma, former smoker EXAM: CT ABDOMEN AND PELVIS WITH CONTRAST TECHNIQUE: Multidetector CT imaging of the abdomen and pelvis was performed using the standard protocol following bolus administration of intravenous contrast. Sagittal and coronal MPR images reconstructed from axial data set. CONTRAST:  169mL ISOVUE-300 IOPAMIDOL (ISOVUE-300) INJECTION 61% IV. No oral contrast administered. COMPARISON:  12/28/2015 FINDINGS: Lower chest: Lung bases clear Hepatobiliary: Gallbladder and liver normal appearance Pancreas: Normal appearance Spleen: Calcified granulomata within spleen. Otherwise normal appearance. Adrenals/Urinary Tract: Adrenal glands normal appearance. Kidneys, ureters, and bladder normal appearance Stomach/Bowel: Appendix surgically absent by  history. Diverticulosis of descending and sigmoid colon. Areas of mild wall thickening of the distal sigmoid colon with minimal pericolic infiltrative changes raising question of subtle sigmoid diverticulitis, better appreciated on coronal images. Small to moderate-sized hiatal hernia. Stomach and bowel loops otherwise normal appearance. Vascular/Lymphatic: Atherosclerotic calcifications aorta without aneurysm. No adenopathy. Reproductive: Uterus surgically absent. Nonvisualization of ovaries. Other: Umbilical hernia containing fat.  No free air or free fluid. Musculoskeletal: Bones demineralized. Scattered degenerative disc and facet disease changes of the thoracolumbar spine. IMPRESSION: Diverticulosis of descending and sigmoid colon with area of mild distal sigmoid wall thickening and minimal pericolic infiltration consistent with acute diverticulitis. Small umbilical hernia containing fat. Aortic Atherosclerosis (ICD10-I70.0). Electronically Signed   By: Lavonia Dana M.D.   On: 02/09/2018 16:03    Assessment & Plan    77 year old female admitted with COPD flare noted to have atrial fibrillation.  Is currently anticoagulated with apixaban at 5 mg twice daily.  Is on amiodarone at 200 mg twice daily as well as digoxin at 0.125 mg daily.  Rate remains somewhat elevated.  Would agree with adding Cardizem 30 mg p.o. every 6 today following rate.  If she remains stable consider discharge on Cardizem CD 120 mg daily along with digoxin at 0.125 and amiodarone at 200 mg twice daily at least for several weeks followed by repeat converting back to 200 mg daily.  If her rate remains elevated today, consideration for discontinuing digoxin and increasing amiodarone to 400 mg twice daily.  Signed, Javier Docker Jaquay Morneault MD 02/25/2018, 8:55 AM  Pager: [phone]

## 2018-02-25 NOTE — Progress Notes (Signed)
[facility] [place] NAME: Vicki Morales    MR#:  [id number]  DATE OF BIRTH:  [birth year]  SUBJECTIVE:   Pt. Was doing to be discharged to a skilled nursing facility yesterday but discharge was held as patient was noted to be tachycardic.  She has atrial fibrillation and rates are still uncontrolled in the 120s.  Asymptomatic.  No other acute events overnight.  REVIEW OF SYSTEMS:    Review of Systems  Constitutional: Negative for chills and fever.  HENT: Negative for congestion and tinnitus.   Eyes: Negative for blurred vision and double vision.  Respiratory: Negative for cough, shortness of breath and wheezing.   Cardiovascular: Negative for chest pain, orthopnea and PND.  Gastrointestinal: Negative for abdominal pain, diarrhea, nausea and vomiting.  Genitourinary: Negative for dysuria and hematuria.  Neurological: Negative for dizziness, sensory change and focal weakness.  All other systems reviewed and are negative.   Nutrition: Regular  Tolerating Diet: Yes Tolerating PT: Eval noted.   DRUG ALLERGIES:   Allergies  Allergen Reactions  . Arthrotec [Diclofenac-Misoprostol] Hives  . Codeine Other (See Comments)    Headache   . Diclofenac Other (See Comments) and Hives    Pt unsure allergy  . Hydrochlorothiazide Other (See Comments)    Weakness   . Penicillins Hives    Has patient had a PCN reaction causing immediate rash, facial/tongue/throat swelling, SOB or lightheadedness with hypotension: No Has patient had a PCN reaction causing severe rash involving mucus membranes or skin necrosis: No Has patient had a PCN reaction that required hospitalization: No Has patient had a PCN reaction occurring within the last 10 years: No If all of the above answers are "NO", then may proceed with Cephalosporin use.  . Sulfa Antibiotics Hives  . Tiotropium Bromide Monohydrate Other (See Comments)    Blurry vision   . Tylenol [Acetaminophen]  Other (See Comments)    Doesn't work  . Morphine Hives and Rash  . Pantoprazole Rash    VITALS:  Blood pressure 126/71, pulse (!) 101, temperature 98 F (36.7 C), temperature source Oral, resp. rate 18, height 5' (1.524 m), weight 118.1 kg, SpO2 93 %.  PHYSICAL EXAMINATION:   Physical Exam  GENERAL:  77 y.o.-year-old obese patient lying in bed in no acute distress.  EYES: Pupils equal, round, reactive to light and accommodation. No scleral icterus. Extraocular muscles intact.  HEENT: Head atraumatic, normocephalic. Oropharynx and nasopharynx clear.  NECK:  Supple, no jugular venous distention. No thyroid enlargement, no tenderness.  LUNGS: Normal breath sounds bilaterally, no wheezing, rales, rhonchi. No use of accessory muscles of respiration.  CARDIOVASCULAR: S1, S2 Irregular. No murmurs, rubs, or gallops.  ABDOMEN: Soft, nontender, nondistended. Bowel sounds present. No organomegaly or mass.  EXTREMITIES: No cyanosis, clubbing or edema b/l.    NEUROLOGIC: Cranial nerves II through XII are intact. No focal Motor or sensory deficits b/l. Globally weak   PSYCHIATRIC: The patient is alert and oriented x 3.  SKIN: No obvious rash, lesion, or ulcer.    LABORATORY PANEL:   CBC Recent Labs  Lab 02/23/18 0852  WBC 4.6  HGB 13.6  HCT 40.4  PLT 151   ------------------------------------------------------------------------------------------------------------------  Chemistries  Recent Labs  Lab 02/19/18 1322  02/24/18 0614  NA 140   < > 132*  K 4.2   < > 5.3*  CL 102   < > 96*  CO2 31   < > 30  GLUCOSE 100*   < >  113*  BUN 15   < > 35*  CREATININE 0.74   < > 0.74  CALCIUM 9.1   < > 8.5*  AST 18  --   --   ALT 12  --   --   ALKPHOS 55  --   --   BILITOT 0.6  --   --    < > = values in this interval not displayed.   ------------------------------------------------------------------------------------------------------------------  Cardiac Enzymes Recent Labs  Lab  02/19/18 1322  TROPONINI <0.03   ------------------------------------------------------------------------------------------------------------------  RADIOLOGY:  No results found.   ASSESSMENT AND PLAN:   77 year old female with past medical history of COPD, hypertension, restless leg syndrome, depression, GERD, anxiety, osteoarthritis, GERD who presented to the hospital due to shortness of breath and noted to be in COPD exacerbation.  1.  COPD exacerbation-much improved.  No further significant wheezing or bronchospasm. -Continue maintenance inhalers including Breo, Theophylline, albuterol nebs   2.  Atrial fibrillation-new onset for the patient.  Rates are somewhat uncontrolled. - Appreciate cardiology input.  Continue amiodarone, digoxin, low-dose Cardizem added today. -Continue Eliquis for anticoagulation.  3.  Hyperkalemia- continue Veltassa, potassium level stable.  4.  Apathy-continue gabapentin.  5.  Anxiety/depression-continue Ativan, trazodone.  6.  Urinary incontinence-continue oxybutynin.  Possible d/c to SNF once HR have improved in the next 1-2 days.   All the records are reviewed and case discussed with Care Management/Social Worker. Management plans discussed with the patient, family and they are in agreement.  CODE STATUS: Full code  DVT Prophylaxis: Eliquis  TOTAL TIME TAKING CARE OF THIS PATIENT: 30 minutes.   POSSIBLE D/C IN 1-2 DAYS, DEPENDING ON CLINICAL CONDITION.   Henreitta Leber M.D on 02/25/2018 at 2:28 PM  Between 7am to 6pm - Pager - [phone]  After 6pm go to [email or web address] - Proofreader  [facility]  Office  [phone]  CC: Primary care physician; Birdie Sons, MD

## 2018-02-25 NOTE — Progress Notes (Addendum)
[facility] arrived to floor to pick up patient. Patients HR noted to be slightly tachycardic at 101. Per [facility], facility unlikely to take patient due to tachycardia. Patient placed on telemetry monitor, HR between 100-122. Scheduled PO amiodarone given. MD Jannifer Franklin notified. Per MD, keep patient overnight to observe HR. Patient placed on telemetry monitor. Will continue to monitor.  Iran Sizer M

## 2018-02-25 NOTE — Plan of Care (Signed)
  Problem: Clinical Measurements: Goal: Ability to maintain clinical measurements within normal limits will improve Outcome: Progressing Goal: Will remain free from infection Outcome: Progressing Goal: Diagnostic test results will improve Outcome: Progressing Goal: Respiratory complications will improve Outcome: Progressing Goal: Cardiovascular complication will be avoided Outcome: Progressing   Problem: Cardiac: Goal: Ability to achieve and maintain adequate cardiopulmonary perfusion will improve Outcome: Progressing

## 2018-02-26 DIAGNOSIS — M1990 Unspecified osteoarthritis, unspecified site: Secondary | ICD-10-CM | POA: Diagnosis present

## 2018-02-26 DIAGNOSIS — E1140 Type 2 diabetes mellitus with diabetic neuropathy, unspecified: Secondary | ICD-10-CM | POA: Diagnosis present

## 2018-02-26 DIAGNOSIS — R109 Unspecified abdominal pain: Secondary | ICD-10-CM | POA: Diagnosis not present

## 2018-02-26 DIAGNOSIS — E8809 Other disorders of plasma-protein metabolism, not elsewhere classified: Secondary | ICD-10-CM | POA: Diagnosis present

## 2018-02-26 DIAGNOSIS — Z23 Encounter for immunization: Secondary | ICD-10-CM | POA: Diagnosis not present

## 2018-02-26 DIAGNOSIS — Z66 Do not resuscitate: Secondary | ICD-10-CM | POA: Diagnosis not present

## 2018-02-26 DIAGNOSIS — Z885 Allergy status to narcotic agent status: Secondary | ICD-10-CM | POA: Diagnosis not present

## 2018-02-26 DIAGNOSIS — Y95 Nosocomial condition: Secondary | ICD-10-CM | POA: Diagnosis present

## 2018-02-26 DIAGNOSIS — R197 Diarrhea, unspecified: Secondary | ICD-10-CM | POA: Diagnosis not present

## 2018-02-26 DIAGNOSIS — J45998 Other asthma: Secondary | ICD-10-CM | POA: Diagnosis not present

## 2018-02-26 DIAGNOSIS — J189 Pneumonia, unspecified organism: Secondary | ICD-10-CM | POA: Diagnosis not present

## 2018-02-26 DIAGNOSIS — J90 Pleural effusion, not elsewhere classified: Secondary | ICD-10-CM | POA: Diagnosis not present

## 2018-02-26 DIAGNOSIS — G4734 Idiopathic sleep related nonobstructive alveolar hypoventilation: Secondary | ICD-10-CM | POA: Diagnosis not present

## 2018-02-26 DIAGNOSIS — J9621 Acute and chronic respiratory failure with hypoxia: Secondary | ICD-10-CM | POA: Diagnosis not present

## 2018-02-26 DIAGNOSIS — M810 Age-related osteoporosis without current pathological fracture: Secondary | ICD-10-CM | POA: Diagnosis not present

## 2018-02-26 DIAGNOSIS — Z515 Encounter for palliative care: Secondary | ICD-10-CM | POA: Diagnosis not present

## 2018-02-26 DIAGNOSIS — J440 Chronic obstructive pulmonary disease with acute lower respiratory infection: Secondary | ICD-10-CM | POA: Diagnosis not present

## 2018-02-26 DIAGNOSIS — J441 Chronic obstructive pulmonary disease with (acute) exacerbation: Secondary | ICD-10-CM | POA: Diagnosis not present

## 2018-02-26 DIAGNOSIS — G9341 Metabolic encephalopathy: Secondary | ICD-10-CM | POA: Diagnosis not present

## 2018-02-26 DIAGNOSIS — J449 Chronic obstructive pulmonary disease, unspecified: Secondary | ICD-10-CM | POA: Diagnosis not present

## 2018-02-26 DIAGNOSIS — Z7189 Other specified counseling: Secondary | ICD-10-CM | POA: Diagnosis not present

## 2018-02-26 DIAGNOSIS — Z6841 Body Mass Index (BMI) 40.0 and over, adult: Secondary | ICD-10-CM | POA: Diagnosis not present

## 2018-02-26 DIAGNOSIS — Z79899 Other long term (current) drug therapy: Secondary | ICD-10-CM | POA: Diagnosis not present

## 2018-02-26 DIAGNOSIS — Z882 Allergy status to sulfonamides status: Secondary | ICD-10-CM | POA: Diagnosis not present

## 2018-02-26 DIAGNOSIS — I4891 Unspecified atrial fibrillation: Secondary | ICD-10-CM | POA: Diagnosis not present

## 2018-02-26 DIAGNOSIS — R918 Other nonspecific abnormal finding of lung field: Secondary | ICD-10-CM | POA: Diagnosis not present

## 2018-02-26 DIAGNOSIS — M6281 Muscle weakness (generalized): Secondary | ICD-10-CM | POA: Diagnosis not present

## 2018-02-26 DIAGNOSIS — R4182 Altered mental status, unspecified: Secondary | ICD-10-CM | POA: Diagnosis not present

## 2018-02-26 DIAGNOSIS — Z9889 Other specified postprocedural states: Secondary | ICD-10-CM | POA: Diagnosis not present

## 2018-02-26 DIAGNOSIS — R531 Weakness: Secondary | ICD-10-CM | POA: Diagnosis not present

## 2018-02-26 DIAGNOSIS — K219 Gastro-esophageal reflux disease without esophagitis: Secondary | ICD-10-CM | POA: Diagnosis present

## 2018-02-26 DIAGNOSIS — J9622 Acute and chronic respiratory failure with hypercapnia: Secondary | ICD-10-CM | POA: Diagnosis not present

## 2018-02-26 DIAGNOSIS — F331 Major depressive disorder, recurrent, moderate: Secondary | ICD-10-CM | POA: Diagnosis not present

## 2018-02-26 DIAGNOSIS — A409 Streptococcal sepsis, unspecified: Secondary | ICD-10-CM | POA: Diagnosis not present

## 2018-02-26 DIAGNOSIS — R0602 Shortness of breath: Secondary | ICD-10-CM | POA: Diagnosis not present

## 2018-02-26 DIAGNOSIS — F200 Paranoid schizophrenia: Secondary | ICD-10-CM | POA: Diagnosis not present

## 2018-02-26 DIAGNOSIS — I480 Paroxysmal atrial fibrillation: Secondary | ICD-10-CM | POA: Diagnosis not present

## 2018-02-26 DIAGNOSIS — I482 Chronic atrial fibrillation, unspecified: Secondary | ICD-10-CM | POA: Diagnosis present

## 2018-02-26 DIAGNOSIS — Z888 Allergy status to other drugs, medicaments and biological substances status: Secondary | ICD-10-CM | POA: Diagnosis not present

## 2018-02-26 DIAGNOSIS — H9190 Unspecified hearing loss, unspecified ear: Secondary | ICD-10-CM | POA: Diagnosis present

## 2018-02-26 DIAGNOSIS — J9602 Acute respiratory failure with hypercapnia: Secondary | ICD-10-CM | POA: Diagnosis not present

## 2018-02-26 DIAGNOSIS — Z880 Allergy status to penicillin: Secondary | ICD-10-CM | POA: Diagnosis not present

## 2018-02-26 DIAGNOSIS — I5033 Acute on chronic diastolic (congestive) heart failure: Secondary | ICD-10-CM | POA: Diagnosis not present

## 2018-02-26 DIAGNOSIS — J9601 Acute respiratory failure with hypoxia: Secondary | ICD-10-CM | POA: Diagnosis not present

## 2018-02-26 DIAGNOSIS — A0472 Enterocolitis due to Clostridium difficile, not specified as recurrent: Secondary | ICD-10-CM | POA: Diagnosis not present

## 2018-02-26 DIAGNOSIS — J13 Pneumonia due to Streptococcus pneumoniae: Secondary | ICD-10-CM | POA: Diagnosis not present

## 2018-02-26 MED ORDER — ALBUTEROL SULFATE (2.5 MG/3ML) 0.083% IN NEBU
INHALATION_SOLUTION | RESPIRATORY_TRACT | Status: AC
  Administered 2018-02-26: 2.5 mg
  Filled 2018-02-26: qty 3

## 2018-02-26 MED ORDER — DILTIAZEM HCL ER COATED BEADS 120 MG PO CP24: 120.0000 mg | ORAL_CAPSULE | Freq: Every day | ORAL | Status: DC

## 2018-02-26 MED ORDER — ALBUTEROL SULFATE (2.5 MG/3ML) 0.083% IN NEBU
2.5000 mg | INHALATION_SOLUTION | Freq: Four times a day (QID) | RESPIRATORY_TRACT | Status: DC | PRN
  Filled 2018-02-26: qty 3

## 2018-02-26 NOTE — Clinical Social Work Note (Signed)
The patient will discharge today via non-emergent EMS to [facility]. The patient and the facility are aware. The CSW attempted to contact both the patient's son and daughter with no ability to leave voicemail. The CSW has sent all updated paperwork to the facility, and the patients discharge packet is on her chart. The CSW is signing off. Please consult should additional needs arise.  Santiago Bumpers, MSW, Latanya Presser [phone]

## 2018-02-26 NOTE — Plan of Care (Signed)
  Problem: Cardiac: Goal: Ability to achieve and maintain adequate cardiopulmonary perfusion will improve Outcome: Progressing   

## 2018-02-26 NOTE — Progress Notes (Addendum)
Okay for discharge from a cardiac standpoint on Cardizem CD 20 mg daily, amiodarone 200 mg daily, digoxin 0.125 mg daily and apixaban.  We will follow-up in our office in 1 to 2 weeks.

## 2018-02-28 ENCOUNTER — Ambulatory Visit: Payer: BLUE CROSS/BLUE SHIELD | Admitting: Psychiatry

## 2018-02-28 DIAGNOSIS — I4891 Unspecified atrial fibrillation: Secondary | ICD-10-CM | POA: Diagnosis not present

## 2018-02-28 DIAGNOSIS — J449 Chronic obstructive pulmonary disease, unspecified: Secondary | ICD-10-CM | POA: Diagnosis not present

## 2018-02-28 DIAGNOSIS — F331 Major depressive disorder, recurrent, moderate: Secondary | ICD-10-CM | POA: Diagnosis not present

## 2018-02-28 DIAGNOSIS — R531 Weakness: Secondary | ICD-10-CM | POA: Diagnosis not present

## 2018-03-01 ENCOUNTER — Inpatient Hospital Stay: Payer: Self-pay | Admitting: Family Medicine

## 2018-03-05 ENCOUNTER — Emergency Department: Payer: Medicare HMO

## 2018-03-05 ENCOUNTER — Encounter: Payer: Self-pay | Admitting: Emergency Medicine

## 2018-03-05 ENCOUNTER — Other Ambulatory Visit: Payer: Self-pay

## 2018-03-05 ENCOUNTER — Inpatient Hospital Stay
Admission: EM | Admit: 2018-03-05 | Discharge: 2018-03-20 | DRG: 871 | Disposition: A | Discharge status: Skilled Nursing Facility | Payer: Medicare HMO | Source: Skilled Nursing Facility | Attending: Internal Medicine | Admitting: Internal Medicine

## 2018-03-05 DIAGNOSIS — J440 Chronic obstructive pulmonary disease with acute lower respiratory infection: Secondary | ICD-10-CM | POA: Diagnosis not present

## 2018-03-05 DIAGNOSIS — I5033 Acute on chronic diastolic (congestive) heart failure: Secondary | ICD-10-CM | POA: Diagnosis present

## 2018-03-05 DIAGNOSIS — Z9889 Other specified postprocedural states: Secondary | ICD-10-CM | POA: Diagnosis not present

## 2018-03-05 DIAGNOSIS — E8809 Other disorders of plasma-protein metabolism, not elsewhere classified: Secondary | ICD-10-CM | POA: Diagnosis present

## 2018-03-05 DIAGNOSIS — J441 Chronic obstructive pulmonary disease with (acute) exacerbation: Secondary | ICD-10-CM | POA: Diagnosis not present

## 2018-03-05 DIAGNOSIS — Z882 Allergy status to sulfonamides status: Secondary | ICD-10-CM | POA: Diagnosis not present

## 2018-03-05 DIAGNOSIS — Z888 Allergy status to other drugs, medicaments and biological substances status: Secondary | ICD-10-CM

## 2018-03-05 DIAGNOSIS — J90 Pleural effusion, not elsewhere classified: Secondary | ICD-10-CM | POA: Diagnosis not present

## 2018-03-05 DIAGNOSIS — M1990 Unspecified osteoarthritis, unspecified site: Secondary | ICD-10-CM | POA: Diagnosis present

## 2018-03-05 DIAGNOSIS — E1140 Type 2 diabetes mellitus with diabetic neuropathy, unspecified: Secondary | ICD-10-CM | POA: Diagnosis not present

## 2018-03-05 DIAGNOSIS — R4182 Altered mental status, unspecified: Secondary | ICD-10-CM | POA: Diagnosis not present

## 2018-03-05 DIAGNOSIS — A409 Streptococcal sepsis, unspecified: Secondary | ICD-10-CM | POA: Diagnosis not present

## 2018-03-05 DIAGNOSIS — Z90710 Acquired absence of both cervix and uterus: Secondary | ICD-10-CM

## 2018-03-05 DIAGNOSIS — M25561 Pain in right knee: Secondary | ICD-10-CM | POA: Diagnosis present

## 2018-03-05 DIAGNOSIS — H9190 Unspecified hearing loss, unspecified ear: Secondary | ICD-10-CM | POA: Diagnosis present

## 2018-03-05 DIAGNOSIS — Z87891 Personal history of nicotine dependence: Secondary | ICD-10-CM

## 2018-03-05 DIAGNOSIS — M25562 Pain in left knee: Secondary | ICD-10-CM | POA: Diagnosis present

## 2018-03-05 DIAGNOSIS — Z6841 Body Mass Index (BMI) 40.0 and over, adult: Secondary | ICD-10-CM | POA: Diagnosis not present

## 2018-03-05 DIAGNOSIS — J13 Pneumonia due to Streptococcus pneumoniae: Secondary | ICD-10-CM | POA: Diagnosis present

## 2018-03-05 DIAGNOSIS — I482 Chronic atrial fibrillation, unspecified: Secondary | ICD-10-CM | POA: Diagnosis present

## 2018-03-05 DIAGNOSIS — E785 Hyperlipidemia, unspecified: Secondary | ICD-10-CM | POA: Diagnosis present

## 2018-03-05 DIAGNOSIS — R918 Other nonspecific abnormal finding of lung field: Secondary | ICD-10-CM | POA: Diagnosis not present

## 2018-03-05 DIAGNOSIS — Z79890 Hormone replacement therapy (postmenopausal): Secondary | ICD-10-CM

## 2018-03-05 DIAGNOSIS — J189 Pneumonia, unspecified organism: Secondary | ICD-10-CM | POA: Diagnosis not present

## 2018-03-05 DIAGNOSIS — G4734 Idiopathic sleep related nonobstructive alveolar hypoventilation: Secondary | ICD-10-CM | POA: Diagnosis not present

## 2018-03-05 DIAGNOSIS — Z79899 Other long term (current) drug therapy: Secondary | ICD-10-CM | POA: Diagnosis not present

## 2018-03-05 DIAGNOSIS — G9341 Metabolic encephalopathy: Secondary | ICD-10-CM | POA: Diagnosis present

## 2018-03-05 DIAGNOSIS — Z9841 Cataract extraction status, right eye: Secondary | ICD-10-CM

## 2018-03-05 DIAGNOSIS — G8929 Other chronic pain: Secondary | ICD-10-CM | POA: Diagnosis present

## 2018-03-05 DIAGNOSIS — F329 Major depressive disorder, single episode, unspecified: Secondary | ICD-10-CM | POA: Diagnosis present

## 2018-03-05 DIAGNOSIS — F419 Anxiety disorder, unspecified: Secondary | ICD-10-CM | POA: Diagnosis present

## 2018-03-05 DIAGNOSIS — J310 Chronic rhinitis: Secondary | ICD-10-CM | POA: Diagnosis present

## 2018-03-05 DIAGNOSIS — Z66 Do not resuscitate: Secondary | ICD-10-CM | POA: Diagnosis not present

## 2018-03-05 DIAGNOSIS — Z961 Presence of intraocular lens: Secondary | ICD-10-CM | POA: Diagnosis present

## 2018-03-05 DIAGNOSIS — Y95 Nosocomial condition: Secondary | ICD-10-CM | POA: Diagnosis present

## 2018-03-05 DIAGNOSIS — Z818 Family history of other mental and behavioral disorders: Secondary | ICD-10-CM

## 2018-03-05 DIAGNOSIS — I959 Hypotension, unspecified: Secondary | ICD-10-CM | POA: Diagnosis not present

## 2018-03-05 DIAGNOSIS — J9621 Acute and chronic respiratory failure with hypoxia: Secondary | ICD-10-CM | POA: Diagnosis not present

## 2018-03-05 DIAGNOSIS — I480 Paroxysmal atrial fibrillation: Secondary | ICD-10-CM | POA: Diagnosis present

## 2018-03-05 DIAGNOSIS — M810 Age-related osteoporosis without current pathological fracture: Secondary | ICD-10-CM | POA: Diagnosis present

## 2018-03-05 DIAGNOSIS — J45998 Other asthma: Secondary | ICD-10-CM | POA: Diagnosis not present

## 2018-03-05 DIAGNOSIS — J9602 Acute respiratory failure with hypercapnia: Secondary | ICD-10-CM

## 2018-03-05 DIAGNOSIS — Z880 Allergy status to penicillin: Secondary | ICD-10-CM | POA: Diagnosis not present

## 2018-03-05 DIAGNOSIS — Z885 Allergy status to narcotic agent status: Secondary | ICD-10-CM | POA: Diagnosis not present

## 2018-03-05 DIAGNOSIS — Z803 Family history of malignant neoplasm of breast: Secondary | ICD-10-CM

## 2018-03-05 DIAGNOSIS — J9601 Acute respiratory failure with hypoxia: Secondary | ICD-10-CM

## 2018-03-05 DIAGNOSIS — K219 Gastro-esophageal reflux disease without esophagitis: Secondary | ICD-10-CM | POA: Diagnosis present

## 2018-03-05 DIAGNOSIS — R0602 Shortness of breath: Secondary | ICD-10-CM | POA: Diagnosis not present

## 2018-03-05 DIAGNOSIS — A0472 Enterocolitis due to Clostridium difficile, not specified as recurrent: Secondary | ICD-10-CM | POA: Diagnosis not present

## 2018-03-05 DIAGNOSIS — Z7401 Bed confinement status: Secondary | ICD-10-CM | POA: Diagnosis not present

## 2018-03-05 DIAGNOSIS — I272 Pulmonary hypertension, unspecified: Secondary | ICD-10-CM | POA: Diagnosis present

## 2018-03-05 DIAGNOSIS — Z7982 Long term (current) use of aspirin: Secondary | ICD-10-CM

## 2018-03-05 DIAGNOSIS — J9690 Respiratory failure, unspecified, unspecified whether with hypoxia or hypercapnia: Secondary | ICD-10-CM | POA: Diagnosis not present

## 2018-03-05 DIAGNOSIS — Z7901 Long term (current) use of anticoagulants: Secondary | ICD-10-CM

## 2018-03-05 DIAGNOSIS — Z7952 Long term (current) use of systemic steroids: Secondary | ICD-10-CM

## 2018-03-05 DIAGNOSIS — R05 Cough: Secondary | ICD-10-CM | POA: Diagnosis not present

## 2018-03-05 DIAGNOSIS — R339 Retention of urine, unspecified: Secondary | ICD-10-CM | POA: Diagnosis not present

## 2018-03-05 DIAGNOSIS — Z823 Family history of stroke: Secondary | ICD-10-CM

## 2018-03-05 DIAGNOSIS — F203 Undifferentiated schizophrenia: Secondary | ICD-10-CM | POA: Diagnosis present

## 2018-03-05 DIAGNOSIS — I110 Hypertensive heart disease with heart failure: Secondary | ICD-10-CM | POA: Diagnosis present

## 2018-03-05 DIAGNOSIS — Z515 Encounter for palliative care: Secondary | ICD-10-CM | POA: Diagnosis not present

## 2018-03-05 DIAGNOSIS — A403 Sepsis due to Streptococcus pneumoniae: Secondary | ICD-10-CM | POA: Diagnosis not present

## 2018-03-05 DIAGNOSIS — M6281 Muscle weakness (generalized): Secondary | ICD-10-CM | POA: Diagnosis not present

## 2018-03-05 DIAGNOSIS — Z825 Family history of asthma and other chronic lower respiratory diseases: Secondary | ICD-10-CM

## 2018-03-05 DIAGNOSIS — J9811 Atelectasis: Secondary | ICD-10-CM | POA: Diagnosis not present

## 2018-03-05 DIAGNOSIS — J9622 Acute and chronic respiratory failure with hypercapnia: Secondary | ICD-10-CM | POA: Diagnosis present

## 2018-03-05 DIAGNOSIS — G2581 Restless legs syndrome: Secondary | ICD-10-CM | POA: Diagnosis present

## 2018-03-05 DIAGNOSIS — Z7189 Other specified counseling: Secondary | ICD-10-CM | POA: Diagnosis not present

## 2018-03-05 DIAGNOSIS — Z9842 Cataract extraction status, left eye: Secondary | ICD-10-CM

## 2018-03-05 DIAGNOSIS — Z79891 Long term (current) use of opiate analgesic: Secondary | ICD-10-CM

## 2018-03-05 LAB — CBC WITH DIFFERENTIAL/PLATELET
BASOS ABS: 0.1 10*3/uL (ref 0–0.1)
Basophils Relative: 1 %
Eosinophils Absolute: 0 10*3/uL (ref 0–0.7)
Eosinophils Relative: 0 %
HEMATOCRIT: 35.8 % (ref 35.0–47.0)
Hemoglobin: 12 g/dL (ref 12.0–16.0)
LYMPHS PCT: 1 %
Lymphs Abs: 0.2 10*3/uL — ABNORMAL LOW (ref 1.0–3.6)
MCH: 31.7 pg (ref 26.0–34.0)
MCHC: 33.6 g/dL (ref 32.0–36.0)
MCV: 94.5 fL (ref 80.0–100.0)
Monocytes Absolute: 1.1 10*3/uL — ABNORMAL HIGH (ref 0.2–0.9)
Monocytes Relative: 8 %
Neutro Abs: 12.3 10*3/uL — ABNORMAL HIGH (ref 1.4–6.5)
Neutrophils Relative %: 90 %
PLATELETS: 130 10*3/uL — AB (ref 150–440)
RBC: 3.79 MIL/uL — AB (ref 3.80–5.20)
RDW: 15.3 % — ABNORMAL HIGH (ref 11.5–14.5)
WBC: 13.7 10*3/uL — AB (ref 3.6–11.0)

## 2018-03-05 LAB — GLUCOSE, CAPILLARY: Glucose-Capillary: 121 mg/dL — ABNORMAL HIGH (ref 70–99)

## 2018-03-05 LAB — COMPREHENSIVE METABOLIC PANEL
ALT: 12 U/L (ref 0–44)
AST: 13 U/L — AB (ref 15–41)
Albumin: 2.8 g/dL — ABNORMAL LOW (ref 3.5–5.0)
Alkaline Phosphatase: 42 U/L (ref 38–126)
Anion gap: 9 (ref 5–15)
BILIRUBIN TOTAL: 0.9 mg/dL (ref 0.3–1.2)
BUN: 19 mg/dL (ref 8–23)
CO2: 32 mmol/L (ref 22–32)
Calcium: 9.4 mg/dL (ref 8.9–10.3)
Chloride: 90 mmol/L — ABNORMAL LOW (ref 98–111)
Creatinine, Ser: 0.71 mg/dL (ref 0.44–1.00)
GFR calc Af Amer: >60 mL/min (ref >60)
GFR calc non Af Amer: >60 mL/min (ref >60)
Glucose, Bld: 135 mg/dL — ABNORMAL HIGH (ref 70–99)
POTASSIUM: 4.4 mmol/L (ref 3.5–5.1)
Sodium: 131 mmol/L — ABNORMAL LOW (ref 135–145)
Total Protein: 5.7 g/dL — ABNORMAL LOW (ref 6.5–8.1)

## 2018-03-05 LAB — LACTIC ACID, PLASMA
Lactic Acid, Venous: 0.8 mmol/L (ref 0.5–1.9)
Lactic Acid, Venous: 1.7 mmol/L (ref 0.5–1.9)

## 2018-03-05 LAB — BLOOD GAS, VENOUS
Acid-Base Excess: 6.9 mmol/L — ABNORMAL HIGH (ref 0.0–2.0)
Bicarbonate: 37.3 mmol/L — ABNORMAL HIGH (ref 20.0–28.0)
FIO2: 0.5
O2 Saturation: 60.2 %
PATIENT TEMPERATURE: 37
pCO2, Ven: 85 mmHg (ref 44.0–60.0)
pH, Ven: 7.25 (ref 7.250–7.430)
pO2, Ven: 37 mmHg (ref 32.0–45.0)

## 2018-03-05 LAB — TROPONIN I: Troponin I: <0.03 ng/mL (ref <0.03)

## 2018-03-05 LAB — PROTIME-INR
INR: 1.27
Prothrombin Time: 15.8 seconds — ABNORMAL HIGH (ref 11.4–15.2)

## 2018-03-05 LAB — MRSA PCR SCREENING: MRSA BY PCR: NEGATIVE

## 2018-03-05 LAB — PROCALCITONIN: PROCALCITONIN: 0.1 ng/mL

## 2018-03-05 LAB — BRAIN NATRIURETIC PEPTIDE: B NATRIURETIC PEPTIDE 5: 213 pg/mL — AB (ref 0.0–100.0)

## 2018-03-05 MED ORDER — IPRATROPIUM-ALBUTEROL 0.5-2.5 (3) MG/3ML IN SOLN
3.0000 mL | Freq: Once | RESPIRATORY_TRACT | Status: AC
  Administered 2018-03-05: 3 mL via RESPIRATORY_TRACT
  Filled 2018-03-05: qty 3

## 2018-03-05 MED ORDER — SODIUM CHLORIDE 0.9 % IV SOLN
2.0000 g | Freq: Once | INTRAVENOUS | Status: AC
  Administered 2018-03-05: 2 g via INTRAVENOUS
  Filled 2018-03-05: qty 2

## 2018-03-05 MED ORDER — TRAZODONE HCL 50 MG PO TABS: 25.0000 mg | ORAL_TABLET | Freq: Every evening | ORAL | Status: DC | PRN

## 2018-03-05 MED ORDER — BISACODYL 5 MG PO TBEC: 5.0000 mg | DELAYED_RELEASE_TABLET | Freq: Every day | ORAL | Status: DC | PRN

## 2018-03-05 MED ORDER — ONDANSETRON HCL 4 MG/2ML IJ SOLN
4.0000 mg | Freq: Four times a day (QID) | INTRAMUSCULAR | Status: DC | PRN
  Administered 2018-03-12: 4 mg via INTRAVENOUS
  Filled 2018-03-05: qty 2

## 2018-03-05 MED ORDER — HEPARIN SODIUM (PORCINE) 5000 UNIT/ML IJ SOLN: 5000.0000 [IU] | Freq: Three times a day (TID) | INTRAMUSCULAR | Status: DC

## 2018-03-05 MED ORDER — SODIUM CHLORIDE 0.9 % IV SOLN: INTRAVENOUS | Status: DC

## 2018-03-05 MED ORDER — VANCOMYCIN HCL IN DEXTROSE 1-5 GM/200ML-% IV SOLN
1000.0000 mg | Freq: Two times a day (BID) | INTRAVENOUS | Status: DC
  Administered 2018-03-05: 1000 mg via INTRAVENOUS
  Filled 2018-03-05 (×2): qty 200

## 2018-03-05 MED ORDER — PIPERACILLIN-TAZOBACTAM 3.375 G IVPB: 3.3750 g | Freq: Four times a day (QID) | INTRAVENOUS | Status: DC

## 2018-03-05 MED ORDER — VANCOMYCIN HCL IN DEXTROSE 1-5 GM/200ML-% IV SOLN
1000.0000 mg | Freq: Once | INTRAVENOUS | Status: AC
  Administered 2018-03-05: 1000 mg via INTRAVENOUS
  Filled 2018-03-05: qty 200

## 2018-03-05 MED ORDER — IPRATROPIUM-ALBUTEROL 0.5-2.5 (3) MG/3ML IN SOLN
3.0000 mL | RESPIRATORY_TRACT | Status: DC | PRN
  Administered 2018-03-10 – 2018-03-15 (×3): 3 mL via RESPIRATORY_TRACT
  Filled 2018-03-05: qty 3

## 2018-03-05 MED ORDER — ONDANSETRON HCL 4 MG PO TABS
4.0000 mg | ORAL_TABLET | Freq: Four times a day (QID) | ORAL | Status: DC | PRN
  Administered 2018-03-09: 4 mg via ORAL
  Filled 2018-03-05: qty 1

## 2018-03-05 MED ORDER — DOCUSATE SODIUM 100 MG PO CAPS
100.0000 mg | ORAL_CAPSULE | Freq: Two times a day (BID) | ORAL | Status: DC
  Administered 2018-03-06 – 2018-03-08 (×5): 100 mg via ORAL
  Filled 2018-03-05 (×5): qty 1

## 2018-03-05 MED ORDER — SODIUM CHLORIDE 0.9 % IV SOLN
2.0000 g | Freq: Two times a day (BID) | INTRAVENOUS | Status: DC
  Administered 2018-03-05: 2 g via INTRAVENOUS
  Filled 2018-03-05 (×2): qty 2

## 2018-03-05 MED ORDER — ENOXAPARIN SODIUM 150 MG/ML ~~LOC~~ SOLN
1.0000 mg/kg | Freq: Two times a day (BID) | SUBCUTANEOUS | Status: DC
  Administered 2018-03-05: 125 mg via SUBCUTANEOUS
  Filled 2018-03-05 (×3): qty 0.84

## 2018-03-05 MED ORDER — FUROSEMIDE 10 MG/ML IJ SOLN
40.0000 mg | Freq: Once | INTRAMUSCULAR | Status: AC
  Administered 2018-03-05: 40 mg via INTRAVENOUS
  Filled 2018-03-05: qty 4

## 2018-03-05 MED ORDER — IPRATROPIUM-ALBUTEROL 0.5-2.5 (3) MG/3ML IN SOLN
RESPIRATORY_TRACT | Status: AC
  Administered 2018-03-05: 3 mL via RESPIRATORY_TRACT
  Filled 2018-03-05: qty 3

## 2018-03-05 NOTE — ED Notes (Signed)
Spoke with icu - they have not approved the room and will need time.

## 2018-03-05 NOTE — ED Triage Notes (Signed)
Pt brought in from [facility] for [facility]- there for rehab from pneumonia. Very edematous and wheezing.

## 2018-03-05 NOTE — H&P (Signed)
[facility] [place] NAME: Vicki Morales    MR#:  [id number]  DATE OF BIRTH:  02-18-1941  DATE OF ADMISSION:  03/05/2018  PRIMARY CARE PHYSICIAN: Birdie Sons, MD   REQUESTING/REFERRING PHYSICIAN: Siadecki  CHIEF COMPLAINT: Altered mental status   Chief Complaint  Patient presents with  . Code Sepsis    HISTORY OF PRESENT ILLNESS:  Vicki Morales  is a 77 y.o. female with a known history of COPD, chronic atrial fibrillation brought in from [facility] because of altered mental status, respiratory distress.  Patient was alert and oriented this morning at breakfast time, and 11 AM patient was confused and also has shortness of breath so brought in here.  Patient ABG showed elevated PCO2 so started on BiPAP, chest x-ray concerning for right-sided pneumonia.  According to patient daughter she was doing okay yesterday and day before and also when this morning.  Noted suddenly confused with shortness of breath.  Chest x-ray concerning for right-sided pneumonia.  Patient's O2 saturation 80% when EMS arrived  PAST MEDICAL HISTORY:   Past Medical History:  Diagnosis Date  . Anxiety   . Arthritis   . Cataract    right eye  . COPD (chronic obstructive pulmonary disease) ([place])   . Depression   . Dyspnea    DOE  . Dysrhythmia   . Edema    FEET/ANKLES  . GERD (gastroesophageal reflux disease)   . H/O wheezing   . History of hiatal hernia   . HOH (hard of hearing)   . Hypertension   . Incontinence of urine in female   . Orthopnea   . Pain    CHRONIC KNEE  . Pain    CHRONIC KNEE  . Paranoid disorder ([place])   . RLS (restless legs syndrome)   . Tremors of nervous system    HANDS    PAST SURGICAL HISTOIRY:   Past Surgical History:  Procedure Laterality Date  . ABDOMINAL HYSTERECTOMY  1988  . APPENDECTOMY  1988  . CARDIAC CATHETERIZATION    . CATARACT EXTRACTION W/PHACO Right 07/14/2016   Procedure: CATARACT  EXTRACTION PHACO AND INTRAOCULAR LENS PLACEMENT ([place]) anterior vitrectomy;  Surgeon: Estill Cotta, MD;  Location: [facility] ORS;  Service: Ophthalmology;  Laterality: Right;  [place] 02:40 AP% 24.4 CDE 59.98 Fluid pack # [id number]  . CATARACT EXTRACTION W/PHACO Left 02/16/2017   Procedure: CATARACT EXTRACTION PHACO AND INTRAOCULAR LENS PLACEMENT (IOC);  Surgeon: Estill Cotta, MD;  Location: [facility] ORS;  Service: Ophthalmology;  Laterality: Left;  Lot # [id number] H [place]: 01:11.8 AP%: 23.5 CDE: 32.09   . ESOPHAGOGASTRODUODENOSCOPY N/A 11/11/2014   Procedure: ESOPHAGOGASTRODUODENOSCOPY (EGD);  Surgeon: Josefine Class, MD;  Location: [facility] ENDOSCOPY;  Service: Endoscopy;  Laterality: N/A;  . TONSILLECTOMY AND ADENOIDECTOMY    . TRIGGER FINGER RELEASE  2004    SOCIAL HISTORY:   Social History   Tobacco Use  . Smoking status: Former Smoker    Years: 30.00  . Smokeless tobacco: Never Used  . Tobacco comment: quit in 1989  Substance Use Topics  . Alcohol use: No    Alcohol/week: 0.0 standard drinks    FAMILY HISTORY:   Family History  Problem Relation Age of Onset  . Breast cancer Sister   . Stroke Father   . Emphysema Father   . Anxiety disorder Father   . Depression Father   . Anxiety disorder Brother     DRUG ALLERGIES:   Allergies  Allergen Reactions  . Arthrotec [Diclofenac-Misoprostol] Hives  . Codeine Other (See Comments)    Headache   . Diclofenac Other (See Comments) and Hives    Pt unsure allergy  . Hydrochlorothiazide Other (See Comments)    Weakness   . Penicillins Hives    Has patient had a PCN reaction causing immediate rash, facial/tongue/throat swelling, SOB or lightheadedness with hypotension: No Has patient had a PCN reaction causing severe rash involving mucus membranes or skin necrosis: No Has patient had a PCN reaction that required hospitalization: No Has patient had a PCN reaction occurring within the last 10 years: No If all of the above answers are  "NO", then may proceed with Cephalosporin use.  . Sulfa Antibiotics Hives  . Tiotropium Bromide Monohydrate Other (See Comments)    Blurry vision   . Tylenol [Acetaminophen] Other (See Comments)    Doesn't work  . Morphine Hives and Rash  . Pantoprazole Rash    REVIEW OF SYSTEMS:  Able to obtain review of systems because patient is not oriented.  MEDICATIONS AT HOME:   Prior to Admission medications   Medication Sig Start Date End Date Taking? Authorizing Provider  acidophilus (RISAQUAD) CAPS capsule Take 1 capsule by mouth 3 (three) times daily with meals. 02/24/18  Yes Salary, Montell D, MD  albuterol (PROVENTIL) (2.5 MG/3ML) 0.083% nebulizer solution Take 3 mLs (2.5 mg total) by nebulization 2 (two) times daily as needed for wheezing or shortness of breath. 09/22/16  Yes Birdie Sons, MD  amiodarone (PACERONE) 200 MG tablet Take 1 tablet (200 mg total) by mouth 2 (two) times daily. 02/24/18  Yes Salary, Avel Peace, MD  apixaban (ELIQUIS) 5 MG TABS tablet Take 1 tablet (5 mg total) by mouth 2 (two) times daily. 02/24/18  Yes Salary, Avel Peace, MD  aspirin EC 81 MG EC tablet Take 1 tablet (81 mg total) by mouth daily. 02/25/18  Yes Salary, Avel Peace, MD  busPIRone (BUSPAR) 10 MG tablet Take 1 tablet (10 mg total) by mouth 3 (three) times daily. 12/09/17  Yes Ursula Alert, MD  Calcium Carbonate-Vitamin D (CALCIUM 600+D) 600-400 MG-UNIT tablet Take 1 tablet by mouth 2 (two) times daily.    Yes [provider]  Cranberry 250 MG CAPS Take 1 capsule by mouth daily.    Yes [provider]  cyanocobalamin 1000 MCG tablet Take 1,000 mcg by mouth daily.   Yes [provider]  digoxin (LANOXIN) 0.125 MG tablet Take 0.5 tablets (0.0625 mg total) by mouth daily. 02/24/18  Yes Salary, Avel Peace, MD  divalproex (DEPAKOTE) 250 MG DR tablet Take 1 tablet (250 mg total) by mouth 2 (two) times daily. 12/09/17  Yes Ursula Alert, MD  feeding supplement, ENSURE ENLIVE, (ENSURE  ENLIVE) LIQD Take 237 mLs by mouth 2 (two) times daily between meals. 02/24/18  Yes Salary, Avel Peace, MD  fluticasone (FLONASE) 50 MCG/ACT nasal spray Place 1 spray into both nostrils daily.   Yes [provider]  Fluticasone-Umeclidin-Vilant (TRELEGY ELLIPTA) 100-62.5-25 MCG/INH AEPB Inhale 1 puff into the lungs daily. 11/08/17  Yes Birdie Sons, MD  gabapentin (NEURONTIN) 100 MG capsule TAKE 1 CAPSULE(100 MG) BY MOUTH TWICE DAILY Patient taking differently: Take 100 mg by mouth 2 (two) times daily.  04/26/17  Yes Birdie Sons, MD  gabapentin (NEURONTIN) 300 MG capsule TAKE 2 CAPSULES(600 MG) BY MOUTH AT BEDTIME Patient taking differently: Take 600 mg by mouth at bedtime.  04/26/17  Yes Birdie Sons, MD  guaiFENesin (  MUCINEX) 600 MG 12 hr tablet Take 1 tablet (600 mg total) by mouth 2 (two) times daily. 02/24/18  Yes Salary, Holly Bodily D, MD  hydrALAZINE (APRESOLINE) 10 MG tablet Take 10 mg by mouth 2 (two) times daily. Hold if BP is <100/70 03/04/18 03/08/18 Yes [provider]  loperamide (IMODIUM) 2 MG capsule Take 1 capsule (2 mg total) by mouth as needed for diarrhea or loose stools. 02/24/18  Yes Salary, Avel Peace, MD  loratadine (CLARITIN) 10 MG tablet Take 10 mg by mouth daily.   Yes [provider]  magnesium oxide (MAG-OX) 400 MG tablet Take 400 mg by mouth 2 (two) times daily.   Yes [provider]  Melatonin 10 MG CAPS Take 10 mg by mouth at bedtime as needed (sleep).    Yes [provider]  montelukast (SINGULAIR) 10 MG tablet TAKE 1 TABLET(10 MG) BY MOUTH EVERY NIGHT Patient taking differently: Take 10 mg by mouth at bedtime.  01/29/18  Yes Birdie Sons, MD  Multiple Vitamin (MULTIVITAMIN WITH MINERALS) TABS tablet Take 1 tablet by mouth daily.   Yes [provider]  Omega-3 Fatty Acids (FISH OIL) 1000 MG CAPS Take 1,000 mg by mouth daily.    Yes [provider]  oxybutynin (DITROPAN-XL) 10 MG 24 hr tablet TAKE 1  TABLET BY MOUTH AT BEDTIME Patient taking differently: Take 10 mg by mouth at bedtime.  08/21/17  Yes Birdie Sons, MD  patiromer (VELTASSA) 8.4 g packet Take 1 packet (8.4 g total) by mouth daily. 02/24/18  Yes Salary, Avel Peace, MD  predniSONE (STERAPRED UNI-PAK 21 TAB) 10 MG (21) TBPK tablet Take as directed 02/24/18  Yes Salary, Montell D, MD  risperiDONE (RISPERDAL) 1 MG tablet Take 1 tablet (1 mg total) by mouth at bedtime. 12/09/17  Yes Ursula Alert, MD  Saccharomyces boulardii (PROBIOTIC) 250 MG CAPS Take 1 capsule by mouth 3 times/day as needed-between meals & bedtime. Patient taking differently: Take 1 capsule by mouth 4 (four) times daily -  with meals and at bedtime.  02/09/18  Yes Merlyn Lot, MD  theophylline (THEO-24) 200 MG 24 hr capsule Take 1 capsule (200 mg total) by mouth daily. 12/07/17  Yes Birdie Sons, MD  tiZANidine (ZANAFLEX) 4 MG tablet Take 1 tablet (4 mg total) by mouth every 8 (eight) hours as needed for muscle spasms. 06/17/17  Yes Birdie Sons, MD  traMADol (ULTRAM) 50 MG tablet Take 1 tablet (50 mg total) by mouth 2 (two) times daily as needed for moderate pain. 02/24/18  Yes Salary, Avel Peace, MD  traZODone (DESYREL) 100 MG tablet Take 1-2 tablets (100-200 mg total) by mouth at bedtime. Patient taking differently: Take 100 mg by mouth at bedtime.  02/07/18  Yes Ursula Alert, MD  azithromycin (ZITHROMAX) 250 MG tablet 1 po daily Patient not taking: Reported on 03/05/2018 02/25/18   Salary, Avel Peace, MD  diltiazem (CARDIZEM CD) 120 MG 24 hr capsule Take 1 capsule (120 mg total) by mouth daily. Patient not taking: Reported on 03/05/2018 02/26/18   Henreitta Leber, MD      VITAL SIGNS:  Blood pressure 113/60, pulse 79, temperature (!) 97.3 F (36.3 C), resp. rate 14, height 5' (1.524 m), weight 126.1 kg, SpO2 99 %.  PHYSICAL EXAMINATION:  GENERAL:  77 y.o.-year-old patient lying in the bed , critically ill, unresponsive on BiPAP. EYES: Pupils equal,  round, reactive to light and accommodation. No scleral icterus. Extraocular muscles intact.  HEENT: Head atraumatic,  normocephalic. Oropharynx and nasopharynx clear.  NECK:  Supple, no jugular venous distention. No thyroid enlargement, no tenderness.  LUNGS: N has coarse breath sounds bilaterally. CARDIOVASCULAR: S1, S2 normal. No murmurs, rubs, or gallops.  ABDOMEN: Soft, nontender, nondistended. Bowel sounds present. No organomegaly or mass.  EXTREMITIES: Bilateral pedal edema present nEUROLOGIC: Unresponsive.[name] Kitchen  PSYCHIATRIC: Unresponsive sKIN: No obvious rash, lesion, or ulcer.   LABORATORY PANEL:   CBC Recent Labs  Lab 03/05/18 1124  WBC 13.7*  HGB 12.0  HCT 35.8  PLT 130*   ------------------------------------------------------------------------------------------------------------------  Chemistries  Recent Labs  Lab 03/05/18 1124  NA 131*  K 4.4  CL 90*  CO2 32  GLUCOSE 135*  BUN 19  CREATININE 0.71  CALCIUM 9.4  AST 13*  ALT 12  ALKPHOS 42  BILITOT 0.9   ------------------------------------------------------------------------------------------------------------------  Cardiac Enzymes Recent Labs  Lab 03/05/18 1124  TROPONINI <0.03   ------------------------------------------------------------------------------------------------------------------  RADIOLOGY:  Dg Chest Portable 1 View  Result Date: 03/05/2018 CLINICAL DATA:  Recent discharge for pneumonia. EXAM: PORTABLE CHEST 1 VIEW COMPARISON:  February 19, 2018 FINDINGS: No pneumothorax. The cardiomediastinal silhouette is stable. New rounded infiltrate in the lateral right base. No other interval change. IMPRESSION: New rounded infiltrate in the lateral right lung base may represent pneumonia given history. Recommend short-term follow-up to ensure resolution. Electronically Signed   By: Dorise Bullion III M.D   On: 03/05/2018 12:24    EKG:   Orders placed or performed during the hospital encounter  of 03/05/18  . ED EKG  . ED EKG  . EKG 12-Lead  . EKG 12-Lead    IMPRESSION AND PLAN:   77 year old female with a COPD, chronic A. fib, restless leg syndrome who was recently discharged on September 22 after she was admitted for COPD exacerbation, comes back from [facility] care because of altered mental status. Assessment and plan # 1 acute respiratory failure with hypoxia, hypercapnia secondary to pneumonia: Patient is now on BiPAP.  Admit to stepdown, continue BiPAP, bronchodilators. 2.  Acute on chronic diastolic heart failure:  Patient edition fraction 50% by echo done in December 04, 2017.  Continue IV Lasix. 3.  Recent onset of atrial fibrillation, seen by Dr. Ubaldo Glassing during last admission, patient is on amiodarone, digoxin, Cardizem, right now because he is unresponsive we are holding her p.o. medicines start Cardizem drip if needed for A. fib, instead of Eliquis she is on Lovenox full dose now and patient is more alert discontinue Lovenox, start back on Eliquis  .  Discussed with ICU attending, patient daughter. All the records are reviewed and case discussed with ED provider. Management plans discussed with the patient, family and they are in agreement.  CODE STATUS: DNR  TOTAL TIME TAKING CARE OF THIS PATIENT: 55 minutes.    Epifanio Lesches M.D on 03/05/2018 at 1:51 PM  Between 7am to 6pm - Pager - [phone]  After 6pm go to [email or web address] - password [email or web address] [facility]  Office  [phone]  CC: Primary care physician; Birdie Sons, MD  Note: This dictation was prepared with Dragon dictation along with smaller phrase technology. Any transcriptional errors that result from this process are unintentional.

## 2018-03-05 NOTE — Consult Note (Signed)
PULMONARY/CCM CONSULT NOTE  Requesting MD/Service: Hospitalists Date of initial consultation: 09/29 Reason for consultation: acute/chronic hypercarbic resp failure  PT PROFILE: 77 y.o. female SNF resident former smoker with multiple chronic medical problems adm via [facility] ED with abrupt onset of MS changes and dyspnea. Pt found to be hypercarbic and BiPAP initiated. CXR c/w RLL PNA. Pt is DNR    HPI:  Level 5 caveat. Pt seen in ED, somnolent, nonconversant on BIPAP  Past Medical History:  Diagnosis Date  . Anxiety   . Arthritis   . Cataract    right eye  . COPD (chronic obstructive pulmonary disease) ([place])   . Depression   . Dyspnea    DOE  . Dysrhythmia   . Edema    FEET/ANKLES  . GERD (gastroesophageal reflux disease)   . H/O wheezing   . History of hiatal hernia   . HOH (hard of hearing)   . Hypertension   . Incontinence of urine in female   . Orthopnea   . Pain    CHRONIC KNEE  . Pain    CHRONIC KNEE  . Paranoid disorder ([place])   . RLS (restless legs syndrome)   . Tremors of nervous system    HANDS    Past Surgical History:  Procedure Laterality Date  . ABDOMINAL HYSTERECTOMY  1988  . APPENDECTOMY  1988  . CARDIAC CATHETERIZATION    . CATARACT EXTRACTION W/PHACO Right 07/14/2016   Procedure: CATARACT EXTRACTION PHACO AND INTRAOCULAR LENS PLACEMENT ([place]) anterior vitrectomy;  Surgeon: Estill Cotta, MD;  Location: [facility] ORS;  Service: Ophthalmology;  Laterality: Right;  [place] 02:40 AP% 24.4 CDE 59.98 Fluid pack # [id number]  . CATARACT EXTRACTION W/PHACO Left 02/16/2017   Procedure: CATARACT EXTRACTION PHACO AND INTRAOCULAR LENS PLACEMENT (IOC);  Surgeon: Estill Cotta, MD;  Location: [facility] ORS;  Service: Ophthalmology;  Laterality: Left;  Lot # [id number] H [place]: 01:11.8 AP%: 23.5 CDE: 32.09   . ESOPHAGOGASTRODUODENOSCOPY N/A 11/11/2014   Procedure: ESOPHAGOGASTRODUODENOSCOPY (EGD);  Surgeon: Josefine Class, MD;  [facility] ENDOSCOPY;  Service: Endoscopy;   Laterality: N/A;  . TONSILLECTOMY AND ADENOIDECTOMY    . TRIGGER FINGER RELEASE  2004    MEDICATIONS: I have reviewed all medications and confirmed regimen as documented  Social History   Socioeconomic History  . Marital status: Widowed    Spouse name: Not on file  . Number of children: 2  . Years of education: Not on file  . Highest education level: 12th grade  Occupational History  . 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 Financial resource strain: Not hard at all  . Food insecurity:    Worry: Never true    Inability: Never true  . Transportation needs:    Medical: No    Non-medical: No  Tobacco Use  . Smoking status: Former Smoker    Years: 30.00  . Smokeless tobacco: Never Used  . Tobacco comment: quit in 1989  Substance and Sexual Activity  . Alcohol use: No    Alcohol/week: 0.0 standard drinks  . Drug use: No  . Sexual activity: Not Currently  Lifestyle  . Physical activity:    Days per week: 0 days    Minutes per session: 0 min  . Stress: Only a little  Relationships  . Social connections:    Talks on phone: Not on file    Gets together: Not on file    Attends religious service: Not on file    Active member of club  or organization: Not on file    Attends meetings of clubs or organizations: Not on file    Relationship status: Not on file  . Intimate partner violence:    Fear of current or ex partner: No    Emotionally abused: No    Physically abused: No    Forced sexual activity: No  Other Topics Concern  . Not on file  Social History Narrative   Lives at home with her daughter.  Ambulates with a walker at baseline    Family History  Problem Relation Age of Onset  . Breast cancer Sister   . Stroke Father   . Emphysema Father   . Anxiety disorder Father   . Depression Father   . Anxiety disorder Brother     ROS: Level 5 caveat   Vitals:   03/05/18 1647 03/05/18 1700 03/05/18 1720 03/05/18 1800  BP: (!) 155/109 (!) 157/82   (!) 149/79  Pulse:  (!) 109 (!) 106 (!) 106  Resp: 14 (!) 35  15  Temp: 97.7 F (36.5 C)     TempSrc: Axillary     SpO2: 99% 99% 98% 95%  Weight: 123.5 kg     Height: 5\' 4"  (1.626 m)        EXAM:  Gen: Obese, somnolent, on BiPAP, no overt distress HEENT: NCAT, sclera white Neck: Supple without LAN, thyromegaly, JVD Lungs: breath sounds diffusely diminished, no wheezes, bronchial BS in RLL  Cardiovascular: RRR, no murmurs noted Abdomen: Soft, nontender, normal BS Ext: trace symmetric ankle, pretibial edema Neuro: CNs grossly intact, withdraws all 4 ext Skin: Limited exam, no lesions noted  DATA:   BMP Latest Ref Rng & Units 03/05/2018 02/24/2018 02/23/2018  Glucose 70 - 99 mg/dL 135(H) 113(H) 132(H)  BUN 8 - 23 mg/dL 19 35(H) 41(H)  Creatinine 0.44 - 1.00 mg/dL 0.71 0.74 0.94  BUN/Creat Ratio 12 - 28 - - -  Sodium 135 - 145 mmol/L 131(L) 132(L) 131(L)  Potassium 3.5 - 5.1 mmol/L 4.4 5.3(H) 5.8(H)  Chloride 98 - 111 mmol/L 90(L) 96(L) 92(L)  CO2 22 - 32 mmol/L 32 30 32  Calcium 8.9 - 10.3 mg/dL 9.4 8.5(L) 9.2    CBC Latest Ref Rng & Units 03/05/2018 02/23/2018 02/22/2018  WBC 3.6 - 11.0 K/uL 13.7(H) 4.6 6.4  Hemoglobin 12.0 - 16.0 g/dL 12.0 13.6 12.7  Hematocrit 35.0 - 47.0 % 35.8 40.4 37.9  Platelets 150 - 440 K/uL 130(L) 151 145(L)    CXR:  RLL opacity c/w PNA  I have personally reviewed all chest radiographs reported above including CXRs and CT chest unless otherwise indicated  IMPRESSION:     ICD-10-CM   1. Healthcare-associated pneumonia J18.9   2. Acute on chronic respiratory failure with hypoxia and hypercapnia (HCC) J96.01    J96.02   3. 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 Former smoker.  5. Documented hx of COPD maintained on Advair, theophylline as outpt. Followed by Dr Raul Del   PLAN:  Agree with current mgmt of SDU, BiPAP as needed, abx (vanc, cefepime). DNR appropriate. Nebulized BDs as needed added   Merton Border, MD PCCM service Mobile [phone] Pager  [phone] 03/05/2018 7:37 PM

## 2018-03-05 NOTE — Progress Notes (Signed)
Pharmacy Antibiotic Note  Vicki Morales is a 77 y.o. female admitted on 03/05/2018 with PNA.  Pharmacy has been consulted for cefepime and vancomycin dosing.  Plan: 1. Cefepime 2 gm IV Q12H 2. Vancomycin 1 gm IV x 1 in ED followed in approximately 6 hours (stacked dosing) by vancomycin 1 gm IV Q12H, predicted trough 17 mcg/ml. Pharmacy will continue to follow and adjust as needed to maintain trough 15 to 20 mcg/ml.   Vd 54.2 L, Ke 0.063 hr-1, T1/2 10.9 hr  Height: 5' (152.4 cm) Weight: 278 lb 1 oz (126.1 kg) IBW/kg (Calculated) : 45.5  Temp (24hrs), Avg:97.3 F (36.3 C), Min:97.3 F (36.3 C), Max:97.3 F (36.3 C)  Recent Labs  Lab 03/05/18 1124  WBC 13.7*  LATICACIDVEN 0.8    Estimated Creatinine Clearance: 71.1 mL/min (by C-G formula based on SCr of 0.74 mg/dL).    Allergies  Allergen Reactions  . Arthrotec [Diclofenac-Misoprostol] Hives  . Codeine Other (See Comments)    Headache   . Diclofenac Other (See Comments) and Hives    Pt unsure allergy  . Hydrochlorothiazide Other (See Comments)    Weakness   . Penicillins Hives    Has patient had a PCN reaction causing immediate rash, facial/tongue/throat swelling, SOB or lightheadedness with hypotension: No Has patient had a PCN reaction causing severe rash involving mucus membranes or skin necrosis: No Has patient had a PCN reaction that required hospitalization: No Has patient had a PCN reaction occurring within the last 10 years: No If all of the above answers are "NO", then may proceed with Cephalosporin use.  . Sulfa Antibiotics Hives  . Tiotropium Bromide Monohydrate Other (See Comments)    Blurry vision   . Tylenol [Acetaminophen] Other (See Comments)    Doesn't work  . Morphine Hives and Rash  . Pantoprazole Rash    Antimicrobials this admission:   Dose adjustments this admission:   Microbiology results:  BCx:   UCx:    Sputum:    MRSA PCR:   Thank you for allowing pharmacy to be a part of this  patient's care.  Laural Benes, Pharm.D., BCPS Clinical Pharmacist 03/05/2018 12:03 PM

## 2018-03-05 NOTE — Clinical Social Work Note (Signed)
Clinical Social Work Assessment  Patient Details  Name: Vicki Morales MRN: [id number] Date of Birth: [birth year]  Date of referral:  03/05/18               Reason for consult:  Other (Comment Required)(From [place] [facility])                Permission sought to share information with:  Family Supports, Customer service manager Permission granted to share information::  Yes, Verbal Permission Granted  Name::     Yvonna Brun Daughter [phone]   Agency::  [facility]  Relationship::     Contact Information:     Housing/Transportation Living arrangements for the past 2 months:  [place], [place] of Information:  Patient, Adult Children Patient Interpreter Needed:  None Criminal Activity/Legal Involvement Pertinent to Current Situation/Hospitalization:  No - Comment as needed Significant Relationships:    Lives with:    Do you feel safe going back to the place where you live?  No Need for family participation in patient care:     Care giving concerns:  Social Worker assessment / plan: LCSW introduced myself to patient who has recently been at hospital she presents today because of altered mental status. Patient is a 77 year old female who is alert and oriented x4  Patient lives with her daughter, but feels like she needs SNF placement first before she is able to return back home.  Patient states she has been to rehab before at [facility], and was just recently at [facility] in August.  Patient states she would like to return back to SNF if she can.   Patient does have a supplementary plan, which can help with the copays.    Employment status:  Retired Nurse, adult PT Recommendations:  [place] / Referral to community resources:   None  Patient/Family's Response to care: TBD  Patient/Family's Understanding of and Emotional Response to Diagnosis, Current  Treatment, and Prognosis:  TBD  Emotional Assessment Appearance:  Appears stated age Attitude/Demeanor/Rapport:  Engaged Affect (typically observed):  Adaptable, Calm Orientation:  Oriented to Self, Oriented to Place, Oriented to  Time, Oriented to Situation Alcohol / Substance use:  Not Applicable Psych involvement (Current and /or in the community):  No (Comment)  Discharge Needs  Concerns to be addressed:  Care Coordination Readmission within the last 30 days:  No Current discharge risk:  None Barriers to Discharge:  Continued Medical Work up   [place], LCSW 03/05/2018, 2:19 PM

## 2018-03-05 NOTE — NC FL2 (Addendum)
[place] LEVEL OF CARE SCREENING TOOL     IDENTIFICATION  Patient Name: Vicki Morales Birthdate: 08-May-1941 Sex: female Admission Date (Current Location): 03/05/2018  [place] and [place] Number:  Engineering geologist and Address:  [facility], [address] [place], [place] [address]      Provider Number: [id number]  Attending Physician Name and Address:  Epifanio Lesches, MD  Relative Name and Phone Number:   [name] daughter [phone]    Current Level of Care:   Recommended Level of Care: [place] Prior Approval Number:    Date Approved/Denied:   PASRR Number: [id number]  Discharge Plan: SNF    Current Diagnoses: Patient Active Problem List   Diagnosis Date Noted  . 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 COPD exacerbation ([place]) 02/19/2018  . Diastolic dysfunction 32/67/1245  . Sepsis ([place]) 12/03/2017  . Schizophrenia, undifferentiated ([place]) 11/24/2017  . COPD with acute exacerbation ([place]) 10/24/2017  . Nocturnal hypoxia 08/21/2016  . Restless leg syndrome 08/20/2016  . Impingement syndrome of shoulder region 07/29/2016  . Hyponatremia 03/21/2016  . Pneumonia 03/18/2016  . History of suicide attempt 03/16/2016  . Overdose 03/16/2016  . Pulmonary hypertension ([place]) 11/11/2015  . Anxiety 09/22/2015  . Depression 09/22/2015  . Osteoarthritis 09/10/2015  . Rosacea 07/03/2015  . Breast pain 11/07/2014  . Callus of foot 11/07/2014  . CN (constipation) 11/07/2014  . Dermatitis, eczematoid 11/07/2014  . Diverticulosis of colon 11/07/2014  . Dizziness 11/07/2014  . Can't get food down 11/07/2014  . Accumulation of fluid in tissues 11/07/2014  . Fatigue 11/07/2014  . FOM (frequency of micturition) 11/07/2014  . Tension type headache 11/07/2014  . LBP (low back pain) 11/07/2014  . Episodic mood disorder ([place]) 11/07/2014  . Extreme obesity 11/07/2014  .  Muscle ache 11/07/2014  . Disturbance of skin sensation 11/07/2014  . Awareness of heartbeats 11/07/2014  . Jerking 11/07/2014  . Body tinea 11/07/2014  . Essential (primary) hypertension 10/10/2014  . Moderate COPD (chronic obstructive pulmonary disease) ([place]) 04/01/2014  . Hx of obesity 04/01/2014  . CAFL (chronic airflow limitation) ([place]) 01/25/2009  . Malaise and fatigue 11/26/2008  . Aphasia 09/26/2008  . 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 GERD (gastroesophageal reflux disease) 06/07/1998  . HLD (hyperlipidemia) 06/07/1998  . OP (osteoporosis) 06/07/1998  . H/O total hysterectomy 03/08/1987  . Schizophrenia, in remission ([place]) 06/07/1978    Orientation RESPIRATION BLADDER Height & Weight     Self, Time, Situation, Place  O2(3 lites) Incontinent Weight: 278 lb 1 oz (126.1 kg) Height:  5' (152.4 cm)  BEHAVIORAL SYMPTOMS/MOOD NEUROLOGICAL BOWEL NUTRITION STATUS      Continent Diet(regular)  AMBULATORY STATUS COMMUNICATION OF NEEDS Skin   Limited Assist Verbally Normal                       Personal Care Assistance Level of Assistance  Bathing, Feeding, Dressing Bathing Assistance: Limited assistance Feeding assistance: Independent Dressing Assistance: Limited assistance     Functional Limitations Info  Sight, Hearing, Speech Sight Info: Adequate Hearing Info: Adequate Speech Info: Adequate    SPECIAL CARE FACTORS FREQUENCY  PT (By licensed PT), OT (By licensed OT)     PT Frequency: x5 OT Frequency: x5            Contractures Contractures Info: Not present    Additional Factors Info  Code Status, Allergies, Psychotropic Code Status Info: DNR  Allergies Info: Arthrotec Diclofenac-misoprostol, Codeine, Diclofenac, Hydrochlorothiazide, Penicillins, Sulfa Antibiotics, Tiotropium Bromide Monohydrate, Tylenol Acetaminophen, Morphine, Pantoprazole Psychotropic Info: Busipirone ( BUSPAR) tablet 10 mg and resperidone 1mg  tablet          Current Medications (03/05/2018):  This is the current hospital active medication list Current Facility-Administered Medications  Medication Dose Route Frequency Provider Last Rate Last Dose  . bisacodyl (DULCOLAX) EC tablet 5 mg  5 mg Oral Daily PRN Epifanio Lesches, MD      . ceFEPIme (MAXIPIME) 2 g in sodium chloride 0.9 % 100 mL IVPB  2 g Intravenous Q12H Arta Silence, MD      . docusate sodium (COLACE) capsule 100 mg  100 mg Oral BID Epifanio Lesches, MD      . enoxaparin (LOVENOX) injection 125 mg  1 mg/kg Subcutaneous Q24H Epifanio Lesches, MD      . furosemide (LASIX) injection 40 mg  40 mg Intravenous Once Epifanio Lesches, MD      . ondansetron (ZOFRAN) tablet 4 mg  4 mg Oral Q6H PRN Epifanio Lesches, MD       Or  . ondansetron (ZOFRAN) injection 4 mg  4 mg Intravenous Q6H PRN Epifanio Lesches, MD      . piperacillin-tazobactam (ZOSYN) IVPB 3.375 g  3.375 g Intravenous Q6H Epifanio Lesches, MD      . traZODone (DESYREL) tablet 25 mg  25 mg Oral QHS PRN Epifanio Lesches, MD      . vancomycin (VANCOCIN) IVPB 1000 mg/200 mL premix  1,000 mg Intravenous Q12H Arta Silence, MD       Current Outpatient Medications  Medication Sig Dispense Refill  . acidophilus (RISAQUAD) CAPS capsule Take 1 capsule by mouth 3 (three) times daily with meals. 60 capsule 0  . albuterol (PROVENTIL) (2.5 MG/3ML) 0.083% nebulizer solution Take 3 mLs (2.5 mg total) by nebulization 2 (two) times daily as needed for wheezing or shortness of breath. 75 mL 5  . amiodarone (PACERONE) 200 MG tablet Take 1 tablet (200 mg total) by mouth 2 (two) times daily. 60 tablet 0  . apixaban (ELIQUIS) 5 MG TABS tablet Take 1 tablet (5 mg total) by mouth 2 (two) times daily. 60 tablet 0  . aspirin EC 81 MG EC tablet Take 1 tablet (81 mg total) by mouth daily. 30 tablet 0  . busPIRone (BUSPAR) 10 MG tablet Take 1 tablet (10 mg total) by mouth 3 (three) times daily. 270 tablet 0  .  Calcium Carbonate-Vitamin D (CALCIUM 600+D) 600-400 MG-UNIT tablet Take 1 tablet by mouth 2 (two) times daily.     . Cranberry 250 MG CAPS Take 1 capsule by mouth daily.     . cyanocobalamin 1000 MCG tablet Take 1,000 mcg by mouth daily.    . digoxin (LANOXIN) 0.125 MG tablet Take 0.5 tablets (0.0625 mg total) by mouth daily. 15 tablet 0  . divalproex (DEPAKOTE) 250 MG DR tablet Take 1 tablet (250 mg total) by mouth 2 (two) times daily. 180 tablet 1  . feeding supplement, ENSURE ENLIVE, (ENSURE ENLIVE) LIQD Take 237 mLs by mouth 2 (two) times daily between meals. 90 Bottle 0  . fluticasone (FLONASE) 50 MCG/ACT nasal spray Place 1 spray into both nostrils daily.    . Fluticasone-Umeclidin-Vilant (TRELEGY ELLIPTA) 100-62.5-25 MCG/INH AEPB Inhale 1 puff into the lungs daily. 1 each 2  . gabapentin (NEURONTIN) 100 MG capsule TAKE 1 CAPSULE(100 MG) BY MOUTH TWICE DAILY (Patient taking differently: Take 100 mg by mouth 2 (two) times daily. )  60 capsule 11  . gabapentin (NEURONTIN) 300 MG capsule TAKE 2 CAPSULES(600 MG) BY MOUTH AT BEDTIME (Patient taking differently: Take 600 mg by mouth at bedtime. ) 60 capsule 11  . guaiFENesin (MUCINEX) 600 MG 12 hr tablet Take 1 tablet (600 mg total) by mouth 2 (two) times daily. 30 tablet 0  . hydrALAZINE (APRESOLINE) 10 MG tablet Take 10 mg by mouth 2 (two) times daily. Hold if BP is <100/70    . loperamide (IMODIUM) 2 MG capsule Take 1 capsule (2 mg total) by mouth as needed for diarrhea or loose stools. 15 capsule 0  . loratadine (CLARITIN) 10 MG tablet Take 10 mg by mouth daily.    . magnesium oxide (MAG-OX) 400 MG tablet Take 400 mg by mouth 2 (two) times daily.    . Melatonin 10 MG CAPS Take 10 mg by mouth at bedtime as needed (sleep).     . montelukast (SINGULAIR) 10 MG tablet TAKE 1 TABLET(10 MG) BY MOUTH EVERY NIGHT (Patient taking differently: Take 10 mg by mouth at bedtime. ) 90 tablet 4  . Multiple Vitamin (MULTIVITAMIN WITH MINERALS) TABS tablet Take 1  tablet by mouth daily.    . Omega-3 Fatty Acids (FISH OIL) 1000 MG CAPS Take 1,000 mg by mouth daily.     [name] Kitchen oxybutynin (DITROPAN-XL) 10 MG 24 hr tablet TAKE 1 TABLET BY MOUTH AT BEDTIME (Patient taking differently: Take 10 mg by mouth at bedtime. ) 30 tablet 5  . patiromer (VELTASSA) 8.4 g packet Take 1 packet (8.4 g total) by mouth daily. 15 packet 0  . predniSONE (STERAPRED UNI-PAK 21 TAB) 10 MG (21) TBPK tablet Take as directed 21 tablet 0  . risperiDONE (RISPERDAL) 1 MG tablet Take 1 tablet (1 mg total) by mouth at bedtime. 90 tablet 1  . Saccharomyces boulardii (PROBIOTIC) 250 MG CAPS Take 1 capsule by mouth 3 times/day as needed-between meals & bedtime. (Patient taking differently: Take 1 capsule by mouth 4 (four) times daily -  with meals and at bedtime. ) 60 capsule 0  . theophylline (THEO-24) 200 MG 24 hr capsule Take 1 capsule (200 mg total) by mouth daily. 30 capsule 5  . tiZANidine (ZANAFLEX) 4 MG tablet Take 1 tablet (4 mg total) by mouth every 8 (eight) hours as needed for muscle spasms. 30 tablet 5  . traMADol (ULTRAM) 50 MG tablet Take 1 tablet (50 mg total) by mouth 2 (two) times daily as needed for moderate pain. 30 tablet 0  . traZODone (DESYREL) 100 MG tablet Take 1-2 tablets (100-200 mg total) by mouth at bedtime. (Patient taking differently: Take 100 mg by mouth at bedtime. ) 180 tablet 1  . azithromycin (ZITHROMAX) 250 MG tablet 1 po daily (Patient not taking: Reported on 03/05/2018) 4 each 0  . diltiazem (CARDIZEM CD) 120 MG 24 hr capsule Take 1 capsule (120 mg total) by mouth daily. (Patient not taking: Reported on 03/05/2018)       Discharge Medications: Please see discharge summary for a list of discharge medications.  Relevant Imaging Results:  Relevant Lab Results:   Additional Information SSN: [id number]  Joana Reamer, [place]

## 2018-03-05 NOTE — ED Notes (Signed)
procalcitonin added on to blood in the lab.

## 2018-03-05 NOTE — ED Provider Notes (Signed)
[facility] Emergency Department Provider Note ____________________________________________   First MD Initiated Contact with Patient 03/05/18 1125     (approximate)  I have reviewed the triage vital signs and the nursing notes.   HISTORY  Chief Complaint Code Sepsis  Level 5 caveat: History of present illness limited due to respiratory distress and altered mental status  HPI Vicki Morales is a 77 y.o. female with PMH as noted below including COPD who presents with worsening shortness of breath and hypoxia.  The patient was at her facility and apparently was in her usual state of health as of 7 AM today.  Around 10:30 she was noted to be hypoxic and confused.  Per EMS, her O2 saturation was in the 80s on oxygen when they arrived.  The patient is not normally on oxygen.  She is in the rehab facility after recent admission for COPD.  The patient denies any pain at this time but does say she feels short of breath.  Past Medical History:  Diagnosis Date  . Anxiety   . Arthritis   . Cataract    right eye  . COPD (chronic obstructive pulmonary disease) ([place])   . Depression   . Dyspnea    DOE  . Dysrhythmia   . Edema    FEET/ANKLES  . GERD (gastroesophageal reflux disease)   . H/O wheezing   . History of hiatal hernia   . HOH (hard of hearing)   . Hypertension   . Incontinence of urine in female   . Orthopnea   . Pain    CHRONIC KNEE  . Pain    CHRONIC KNEE  . Paranoid disorder ([place])   . RLS (restless legs syndrome)   . Tremors of nervous system    HANDS    Patient Active Problem List   Diagnosis Date Noted  . COPD exacerbation ([place]) 02/19/2018  . Diastolic dysfunction 19/37/9024  . Sepsis ([place]) 12/03/2017  . Schizophrenia, undifferentiated ([place]) 11/24/2017  . COPD with acute exacerbation ([place]) 10/24/2017  . Nocturnal hypoxia 08/21/2016  . Restless leg syndrome 08/20/2016  . Impingement syndrome of shoulder region 07/29/2016  .  Hyponatremia 03/21/2016  . Pneumonia 03/18/2016  . History of suicide attempt 03/16/2016  . Overdose 03/16/2016  . Pulmonary hypertension ([place]) 11/11/2015  . Anxiety 09/22/2015  . Depression 09/22/2015  . Osteoarthritis 09/10/2015  . Rosacea 07/03/2015  . Breast pain 11/07/2014  . Callus of foot 11/07/2014  . CN (constipation) 11/07/2014  . Dermatitis, eczematoid 11/07/2014  . Diverticulosis of colon 11/07/2014  . Dizziness 11/07/2014  . Can't get food down 11/07/2014  . Accumulation of fluid in tissues 11/07/2014  . Fatigue 11/07/2014  . FOM (frequency of micturition) 11/07/2014  . Tension type headache 11/07/2014  . LBP (low back pain) 11/07/2014  . Episodic mood disorder ([place]) 11/07/2014  . Extreme obesity 11/07/2014  . Muscle ache 11/07/2014  . Disturbance of skin sensation 11/07/2014  . Awareness of heartbeats 11/07/2014  . Jerking 11/07/2014  . Body tinea 11/07/2014  . Essential (primary) hypertension 10/10/2014  . Moderate COPD (chronic obstructive pulmonary disease) ([place]) 04/01/2014  . Hx of obesity 04/01/2014  . CAFL (chronic airflow limitation) ([place]) 01/25/2009  . Malaise and fatigue 11/26/2008  . Aphasia 09/26/2008  . 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 GERD (gastroesophageal reflux disease) 06/07/1998  . HLD (hyperlipidemia) 06/07/1998  . OP (osteoporosis) 06/07/1998  . H/O total hysterectomy 03/08/1987  . Schizophrenia, in remission ([place]) 06/07/1978  Past Surgical History:  Procedure Laterality Date  . ABDOMINAL HYSTERECTOMY  1988  . APPENDECTOMY  1988  . CARDIAC CATHETERIZATION    . CATARACT EXTRACTION W/PHACO Right 07/14/2016   Procedure: CATARACT EXTRACTION PHACO AND INTRAOCULAR LENS PLACEMENT ([place]) anterior vitrectomy;  Surgeon: Estill Cotta, MD;  Location: [facility] ORS;  Service: Ophthalmology;  Laterality: Right;  [place] 02:40 AP% 24.4 CDE 59.98 Fluid pack # [id number]  . CATARACT EXTRACTION W/PHACO Left 02/16/2017   Procedure:  CATARACT EXTRACTION PHACO AND INTRAOCULAR LENS PLACEMENT (IOC);  Surgeon: Estill Cotta, MD;  Location: [facility] ORS;  Service: Ophthalmology;  Laterality: Left;  Lot # [id number] H [place]: 01:11.8 AP%: 23.5 CDE: 32.09   . ESOPHAGOGASTRODUODENOSCOPY N/A 11/11/2014   Procedure: ESOPHAGOGASTRODUODENOSCOPY (EGD);  Surgeon: Josefine Class, MD;  Location: [facility] ENDOSCOPY;  Service: Endoscopy;  Laterality: N/A;  . TONSILLECTOMY AND ADENOIDECTOMY    . TRIGGER FINGER RELEASE  2004    Prior to Admission medications   Medication Sig Start Date End Date Taking? Authorizing Provider  acidophilus (RISAQUAD) CAPS capsule Take 1 capsule by mouth 3 (three) times daily with meals. 02/24/18  Yes Salary, Montell D, MD  albuterol (PROVENTIL) (2.5 MG/3ML) 0.083% nebulizer solution Take 3 mLs (2.5 mg total) by nebulization 2 (two) times daily as needed for wheezing or shortness of breath. 09/22/16  Yes Birdie Sons, MD  amiodarone (PACERONE) 200 MG tablet Take 1 tablet (200 mg total) by mouth 2 (two) times daily. 02/24/18  Yes Salary, Avel Peace, MD  apixaban (ELIQUIS) 5 MG TABS tablet Take 1 tablet (5 mg total) by mouth 2 (two) times daily. 02/24/18  Yes Salary, Avel Peace, MD  aspirin EC 81 MG EC tablet Take 1 tablet (81 mg total) by mouth daily. 02/25/18  Yes Salary, Avel Peace, MD  busPIRone (BUSPAR) 10 MG tablet Take 1 tablet (10 mg total) by mouth 3 (three) times daily. 12/09/17  Yes Ursula Alert, MD  Calcium Carbonate-Vitamin D (CALCIUM 600+D) 600-400 MG-UNIT tablet Take 1 tablet by mouth 2 (two) times daily.    Yes [provider]  Cranberry 250 MG CAPS Take 1 capsule by mouth daily.    Yes [provider]  cyanocobalamin 1000 MCG tablet Take 1,000 mcg by mouth daily.   Yes [provider]  digoxin (LANOXIN) 0.125 MG tablet Take 0.5 tablets (0.0625 mg total) by mouth daily. 02/24/18  Yes Salary, Avel Peace, MD  divalproex (DEPAKOTE) 250 MG DR tablet Take 1 tablet (250 mg total) by mouth 2  (two) times daily. 12/09/17  Yes Ursula Alert, MD  feeding supplement, ENSURE ENLIVE, (ENSURE ENLIVE) LIQD Take 237 mLs by mouth 2 (two) times daily between meals. 02/24/18  Yes Salary, Avel Peace, MD  fluticasone (FLONASE) 50 MCG/ACT nasal spray Place 1 spray into both nostrils daily.   Yes [provider]  Fluticasone-Umeclidin-Vilant (TRELEGY ELLIPTA) 100-62.5-25 MCG/INH AEPB Inhale 1 puff into the lungs daily. 11/08/17  Yes Birdie Sons, MD  gabapentin (NEURONTIN) 100 MG capsule TAKE 1 CAPSULE(100 MG) BY MOUTH TWICE DAILY Patient taking differently: Take 100 mg by mouth 2 (two) times daily.  04/26/17  Yes Birdie Sons, MD  gabapentin (NEURONTIN) 300 MG capsule TAKE 2 CAPSULES(600 MG) BY MOUTH AT BEDTIME Patient taking differently: Take 600 mg by mouth at bedtime.  04/26/17  Yes Birdie Sons, MD  guaiFENesin (MUCINEX) 600 MG 12 hr tablet Take 1 tablet (600 mg total) by mouth 2 (two) times daily. 02/24/18  Yes Salary, Avel Peace, MD  hydrALAZINE (APRESOLINE)  10 MG tablet Take 10 mg by mouth 2 (two) times daily. Hold if BP is <100/70 03/04/18 03/08/18 Yes [provider]  loperamide (IMODIUM) 2 MG capsule Take 1 capsule (2 mg total) by mouth as needed for diarrhea or loose stools. 02/24/18  Yes Salary, Avel Peace, MD  loratadine (CLARITIN) 10 MG tablet Take 10 mg by mouth daily.   Yes [provider]  magnesium oxide (MAG-OX) 400 MG tablet Take 400 mg by mouth 2 (two) times daily.   Yes [provider]  Melatonin 10 MG CAPS Take 10 mg by mouth at bedtime as needed (sleep).    Yes [provider]  montelukast (SINGULAIR) 10 MG tablet TAKE 1 TABLET(10 MG) BY MOUTH EVERY NIGHT Patient taking differently: Take 10 mg by mouth at bedtime.  01/29/18  Yes Birdie Sons, MD  Multiple Vitamin (MULTIVITAMIN WITH MINERALS) TABS tablet Take 1 tablet by mouth daily.   Yes [provider]  Omega-3 Fatty Acids (FISH OIL) 1000 MG CAPS Take 1,000 mg by mouth  daily.    Yes [provider]  oxybutynin (DITROPAN-XL) 10 MG 24 hr tablet TAKE 1 TABLET BY MOUTH AT BEDTIME Patient taking differently: Take 10 mg by mouth at bedtime.  08/21/17  Yes Birdie Sons, MD  patiromer (VELTASSA) 8.4 g packet Take 1 packet (8.4 g total) by mouth daily. 02/24/18  Yes Salary, Avel Peace, MD  predniSONE (STERAPRED UNI-PAK 21 TAB) 10 MG (21) TBPK tablet Take as directed 02/24/18  Yes Salary, Montell D, MD  risperiDONE (RISPERDAL) 1 MG tablet Take 1 tablet (1 mg total) by mouth at bedtime. 12/09/17  Yes Ursula Alert, MD  Saccharomyces boulardii (PROBIOTIC) 250 MG CAPS Take 1 capsule by mouth 3 times/day as needed-between meals & bedtime. Patient taking differently: Take 1 capsule by mouth 4 (four) times daily -  with meals and at bedtime.  02/09/18  Yes Merlyn Lot, MD  theophylline (THEO-24) 200 MG 24 hr capsule Take 1 capsule (200 mg total) by mouth daily. 12/07/17  Yes Birdie Sons, MD  tiZANidine (ZANAFLEX) 4 MG tablet Take 1 tablet (4 mg total) by mouth every 8 (eight) hours as needed for muscle spasms. 06/17/17  Yes Birdie Sons, MD  traMADol (ULTRAM) 50 MG tablet Take 1 tablet (50 mg total) by mouth 2 (two) times daily as needed for moderate pain. 02/24/18  Yes Salary, Avel Peace, MD  traZODone (DESYREL) 100 MG tablet Take 1-2 tablets (100-200 mg total) by mouth at bedtime. Patient taking differently: Take 100 mg by mouth at bedtime.  02/07/18  Yes Ursula Alert, MD  azithromycin (ZITHROMAX) 250 MG tablet 1 po daily Patient not taking: Reported on 03/05/2018 02/25/18   Salary, Avel Peace, MD  diltiazem (CARDIZEM CD) 120 MG 24 hr capsule Take 1 capsule (120 mg total) by mouth daily. Patient not taking: Reported on 03/05/2018 02/26/18   Henreitta Leber, MD    Allergies Arthrotec [diclofenac-misoprostol]; Codeine; Diclofenac; Hydrochlorothiazide; Penicillins; Sulfa antibiotics; Tiotropium bromide monohydrate; Tylenol [acetaminophen]; Morphine; and  Pantoprazole  Family History  Problem Relation Age of Onset  . Breast cancer Sister   . Stroke Father   . Emphysema Father   . Anxiety disorder Father   . Depression Father   . Anxiety disorder Brother     Social History Social History   Tobacco Use  . Smoking status: Former Smoker    Years: 30.00  . Smokeless tobacco: Never Used  . Tobacco comment: quit in 1989  Substance Use Topics  . Alcohol use: No    Alcohol/week: 0.0 standard drinks  . Drug use: No    Review of Systems Level 5 caveat: Unable to obtain review of systems due to respiratory distress/altered mental status   ____________________________________________   PHYSICAL EXAM:  VITAL SIGNS: ED Triage Vitals  Enc Vitals Group     BP 03/05/18 1122 (!) 158/64     Pulse Rate 03/05/18 1122 (!) 104     Resp 03/05/18 1122 (!) 23     Temp 03/05/18 1122 (!) 97.3 F (36.3 C)     Temp src --      SpO2 03/05/18 1121 (!) 81 %     Weight 03/05/18 1125 278 lb 1 oz (126.1 kg)     Height 03/05/18 1125 5' (1.524 m)     Head Circumference --      Peak Flow --      Pain Score 03/05/18 1125 0     Pain Loc --      Pain Edu? --      Excl. in [place]? --     Constitutional: Alert, very weak appearing.  Answering most questions appropriately. Eyes: Conjunctivae are normal.  EOMI. Head: Atraumatic. Nose: No congestion/rhinnorhea. Mouth/Throat: Mucous membranes are very dry.   Neck: Normal range of motion.  Cardiovascular: Tachycardic, irregular rhythm. Grossly normal heart sounds.  Good peripheral circulation. Respiratory: Increased respiratory effort.  Diffuse wheezing bilaterally. Gastrointestinal: Soft and nontender. No distention.  Genitourinary: No flank tenderness. Musculoskeletal: Diffuse 2-3+ edema to all extremities.  Extremities warm and well perfused.  Neurologic: Motor intact in all extremities.  No gross focal neurologic deficits are appreciated.  Skin:  Skin is warm and dry. No rash noted. Psychiatric:  Unable to assess.  ____________________________________________   LABS (all labs ordered are listed, but only abnormal results are displayed)  Labs Reviewed  COMPREHENSIVE METABOLIC PANEL - Abnormal; Notable for the following components:      Result Value   Sodium 131 (*)    Chloride 90 (*)    Glucose, Bld 135 (*)    Total Protein 5.7 (*)    Albumin 2.8 (*)    AST 13 (*)    All other components within normal limits  BRAIN NATRIURETIC PEPTIDE - Abnormal; Notable for the following components:   B Natriuretic Peptide 213.0 (*)    All other components within normal limits  CBC WITH DIFFERENTIAL/PLATELET - Abnormal; Notable for the following components:   WBC 13.7 (*)    RBC 3.79 (*)    RDW 15.3 (*)    Platelets 130 (*)    Neutro Abs 12.3 (*)    Lymphs Abs 0.2 (*)    Monocytes Absolute 1.1 (*)    All other components within normal limits  PROTIME-INR - Abnormal; Notable for the following components:   Prothrombin Time 15.8 (*)    All other components within normal limits  BLOOD GAS, VENOUS - Abnormal; Notable for the following components:   pCO2, Ven 85 (*)    Bicarbonate 37.3 (*)    Acid-Base Excess 6.9 (*)    All other components within normal limits  CULTURE, BLOOD (ROUTINE X 2)  CULTURE, BLOOD (ROUTINE X 2)  MRSA PCR SCREENING  TROPONIN I  LACTIC ACID, PLASMA  LACTIC ACID, PLASMA  URINALYSIS, COMPLETE (UACMP) WITH MICROSCOPIC  PROCALCITONIN   ____________________________________________  EKG  ED ECG REPORT I, Arta Silence, the attending physician, personally viewed and interpreted this ECG.  Date: 03/05/2018 EKG Time:  1126 Rate: 115 Rhythm: Atrial fibrillation with occasional PVCs QRS Axis: normal Intervals: normal ST/T Wave abnormalities: Nonspecific ST abnormalities laterally Narrative Interpretation: no evidence of acute ischemia  ____________________________________________  RADIOLOGY  CXR: New infiltrate right lung base consistent with  pneumonia  ____________________________________________   PROCEDURES  Procedure(s) performed: No  Procedures  Critical Care performed: Yes  CRITICAL CARE Performed by: Arta Silence   Total critical care time: 45 minutes  Critical care time was exclusive of separately billable procedures and treating other patients.  Critical care was necessary to treat or prevent imminent or life-threatening deterioration.  Critical care was time spent personally by me on the following activities: development of treatment plan with patient and/or surrogate as well as nursing, discussions with consultants, evaluation of patient's response to treatment, examination of patient, obtaining history from patient or surrogate, ordering and performing treatments and interventions, ordering and review of laboratory studies, ordering and review of radiographic studies, pulse oximetry and re-evaluation of patient's condition. ____________________________________________   INITIAL IMPRESSION / ASSESSMENT AND PLAN / ED COURSE  Pertinent labs & imaging results that were available during my care of the patient were reviewed by me and considered in my medical decision making (see chart for details).  77 year old female with PMH as noted above presents with acute onset of shortness of breath, hypoxia, and altered mental status.  Per EMS the patient was noted by facility staff to be at her baseline earlier this morning around 7.  She is in rehab currently after a recent admission for COPD exacerbation.   I reviewed the past medical records in epic; the patient's most recent admission was earlier this month with discharge on 02/26/2018, with primary diagnosis of COPD exacerbation.  On exam, the patient is weak and somewhat confused appearing although answering most of my questions appropriately.  Exam is otherwise notable for hypoxia, diffuse wheezing, and diffuse edema to all extremities.  Primary  differential is COPD exacerbation (with mental status likely due to hypercapnia) versus CHF/pulmonary edema.  Presentation favors COPD.  Also consider healthcare associated pneumonia.  The patient has a DNR in place.  Due to the hypoxia, we placed her on BiPAP.  I feel that her mental status is adequate for BiPAP given that she is awake and able to answer my questions.  She denies nausea.  We will obtain x-ray, lab work-up, give nebs, and reassess.  Plan for admission.  ----------------------------------------- 1:17 PM on 03/05/2018 -----------------------------------------  Chest x-ray shows significant new infiltrate, consistent with HCAP.  The patient is also hypercapnic which explains her mental status.  She has stabilized on the BiPAP although is slightly more somnolent than previously.  I think that this is mainly due to relief from work of breathing.  She has an elevated WBC count consistent with pneumonia but no elevated lactate.  I discussed her case with her daughter who is now present, as well as with her son over the phone.  I confirm that she has a DNR and the family believes that her wishes would be not to be intubated.  Antibiotics have been initiated for HCAP.  I signed the patient out to the hospitalist Dr. Vianne Bulls.  ____________________________________________   FINAL CLINICAL IMPRESSION(S) / ED DIAGNOSES  Final diagnoses:  Healthcare-associated pneumonia  Acute respiratory failure with hypoxia and hypercapnia ([place])      NEW MEDICATIONS STARTED DURING THIS VISIT:  New Prescriptions   No medications on file     Note:  This document was prepared using Dragon  voice recognition software and may include unintentional dictation errors.    Arta Silence, MD 03/05/18 1319

## 2018-03-05 NOTE — Progress Notes (Signed)
ANTICOAGULATION CONSULT NOTE - Initial Consult  Pharmacy Consult for Lovenox  Indication: atrial fibrillation  Allergies  Allergen Reactions  . Arthrotec [Diclofenac-Misoprostol] Hives  . Codeine Other (See Comments)    Headache   . Diclofenac Other (See Comments) and Hives    Pt unsure allergy  . Hydrochlorothiazide Other (See Comments)    Weakness   . Penicillins Hives    Has patient had a PCN reaction causing immediate rash, facial/tongue/throat swelling, SOB or lightheadedness with hypotension: No Has patient had a PCN reaction causing severe rash involving mucus membranes or skin necrosis: No Has patient had a PCN reaction that required hospitalization: No Has patient had a PCN reaction occurring within the last 10 years: No If all of the above answers are "NO", then may proceed with Cephalosporin use.  . Sulfa Antibiotics Hives  . Tiotropium Bromide Monohydrate Other (See Comments)    Blurry vision   . Tylenol [Acetaminophen] Other (See Comments)    Doesn't work  . Morphine Hives and Rash  . Pantoprazole Rash    Patient Measurements: Height: 5\' 4"  (162.6 cm) Weight: 272 lb 4.3 oz (123.5 kg) IBW/kg (Calculated) : 54.7 Heparin Dosing Weight:    Vital Signs: Temp: 97.7 F (36.5 C) (09/29 1647) Temp Source: Axillary (09/29 1647) BP: 155/109 (09/29 1647) Pulse Rate: 114 (09/29 1545)  Labs: Recent Labs    03/05/18 1124  HGB 12.0  HCT 35.8  PLT 130*  LABPROT 15.8*  INR 1.27  CREATININE 0.71  TROPONINI <0.03    Estimated Creatinine Clearance: 75.2 mL/min (by C-G formula based on SCr of 0.71 mg/dL).   Medical History: Past Medical History:  Diagnosis Date  . Anxiety   . Arthritis   . Cataract    right eye  . COPD (chronic obstructive pulmonary disease) ([place])   . Depression   . Dyspnea    DOE  . Dysrhythmia   . Edema    FEET/ANKLES  . GERD (gastroesophageal reflux disease)   . H/O wheezing   . History of hiatal hernia   . HOH (hard of hearing)    . Hypertension   . Incontinence of urine in female   . Orthopnea   . Pain    CHRONIC KNEE  . Pain    CHRONIC KNEE  . Paranoid disorder ([name])   . RLS (restless legs syndrome)   . Tremors of nervous system    HANDS    Medications:  Medications Prior to Admission  Medication Sig Dispense Refill Last Dose  . acidophilus (RISAQUAD) CAPS capsule Take 1 capsule by mouth 3 (three) times daily with meals. 60 capsule 0 03/04/2018 at 1800  . albuterol (PROVENTIL) (2.5 MG/3ML) 0.083% nebulizer solution Take 3 mLs (2.5 mg total) by nebulization 2 (two) times daily as needed for wheezing or shortness of breath. 75 mL 5 Unknown at PRN  . amiodarone (PACERONE) 200 MG tablet Take 1 tablet (200 mg total) by mouth 2 (two) times daily. 60 tablet 0 03/04/2018 at 1700  . apixaban (ELIQUIS) 5 MG TABS tablet Take 1 tablet (5 mg total) by mouth 2 (two) times daily. 60 tablet 0 03/04/2018 at 1700  . aspirin EC 81 MG EC tablet Take 1 tablet (81 mg total) by mouth daily. 30 tablet 0 03/04/2018 at 0800  . busPIRone (BUSPAR) 10 MG tablet Take 1 tablet (10 mg total) by mouth 3 (three) times daily. 270 tablet 0 03/04/2018 at 1800  . Calcium Carbonate-Vitamin D (CALCIUM 600+D) 600-400 MG-UNIT tablet Take 1  tablet by mouth 2 (two) times daily.    03/04/2018 at 2000  . Cranberry 250 MG CAPS Take 1 capsule by mouth daily.    03/04/2018 at 0800  . cyanocobalamin 1000 MCG tablet Take 1,000 mcg by mouth daily.   03/04/2018 at 0800  . digoxin (LANOXIN) 0.125 MG tablet Take 0.5 tablets (0.0625 mg total) by mouth daily. 15 tablet 0 03/04/2018 at 0800  . divalproex (DEPAKOTE) 250 MG DR tablet Take 1 tablet (250 mg total) by mouth 2 (two) times daily. 180 tablet 1 03/04/2018 at 2000  . feeding supplement, ENSURE ENLIVE, (ENSURE ENLIVE) LIQD Take 237 mLs by mouth 2 (two) times daily between meals. 90 Bottle 0   . fluticasone (FLONASE) 50 MCG/ACT nasal spray Place 1 spray into both nostrils daily.   03/04/2018 at 0800  .  Fluticasone-Umeclidin-Vilant (TRELEGY ELLIPTA) 100-62.5-25 MCG/INH AEPB Inhale 1 puff into the lungs daily. 1 each 2 03/04/2018 at 0800  . gabapentin (NEURONTIN) 100 MG capsule TAKE 1 CAPSULE(100 MG) BY MOUTH TWICE DAILY (Patient taking differently: Take 100 mg by mouth 2 (two) times daily. ) 60 capsule 11 03/04/2018 at 1800  . gabapentin (NEURONTIN) 300 MG capsule TAKE 2 CAPSULES(600 MG) BY MOUTH AT BEDTIME (Patient taking differently: Take 600 mg by mouth at bedtime. ) 60 capsule 11 03/04/2018 at 2000  . guaiFENesin (MUCINEX) 600 MG 12 hr tablet Take 1 tablet (600 mg total) by mouth 2 (two) times daily. 30 tablet 0 03/04/2018 at 2000  . hydrALAZINE (APRESOLINE) 10 MG tablet Take 10 mg by mouth 2 (two) times daily. Hold if BP is <100/70   As directed at As directed  . loperamide (IMODIUM) 2 MG capsule Take 1 capsule (2 mg total) by mouth as needed for diarrhea or loose stools. 15 capsule 0 Unknown at PRN  . loratadine (CLARITIN) 10 MG tablet Take 10 mg by mouth daily.   03/04/2018 at 0800  . magnesium oxide (MAG-OX) 400 MG tablet Take 400 mg by mouth 2 (two) times daily.   03/04/2018 at 1800  . Melatonin 10 MG CAPS Take 10 mg by mouth at bedtime as needed (sleep).    Unknown at PRN  . montelukast (SINGULAIR) 10 MG tablet TAKE 1 TABLET(10 MG) BY MOUTH EVERY NIGHT (Patient taking differently: Take 10 mg by mouth at bedtime. ) 90 tablet 4 03/04/2018 at 2000  . Multiple Vitamin (MULTIVITAMIN WITH MINERALS) TABS tablet Take 1 tablet by mouth daily.   03/04/2018 at 0800  . Omega-3 Fatty Acids (FISH OIL) 1000 MG CAPS Take 1,000 mg by mouth daily.    03/04/2018 at 0800  . oxybutynin (DITROPAN-XL) 10 MG 24 hr tablet TAKE 1 TABLET BY MOUTH AT BEDTIME (Patient taking differently: Take 10 mg by mouth at bedtime. ) 30 tablet 5 03/04/2018 at 2000  . patiromer (VELTASSA) 8.4 g packet Take 1 packet (8.4 g total) by mouth daily. 15 packet 0 03/04/2018 at 0800  . predniSONE (STERAPRED UNI-PAK 21 TAB) 10 MG (21) TBPK tablet Take  as directed 21 tablet 0 03/04/2018 at Unknown time  . risperiDONE (RISPERDAL) 1 MG tablet Take 1 tablet (1 mg total) by mouth at bedtime. 90 tablet 1 03/04/2018 at 2000  . Saccharomyces boulardii (PROBIOTIC) 250 MG CAPS Take 1 capsule by mouth 3 times/day as needed-between meals & bedtime. (Patient taking differently: Take 1 capsule by mouth 4 (four) times daily -  with meals and at bedtime. ) 60 capsule 0 03/04/2018 at 2000  . theophylline (THEO-24) 200 MG  24 hr capsule Take 1 capsule (200 mg total) by mouth daily. 30 capsule 5 03/04/2018 at 0800  . tiZANidine (ZANAFLEX) 4 MG tablet Take 1 tablet (4 mg total) by mouth every 8 (eight) hours as needed for muscle spasms. 30 tablet 5 Unknown at PRN  . traMADol (ULTRAM) 50 MG tablet Take 1 tablet (50 mg total) by mouth 2 (two) times daily as needed for moderate pain. 30 tablet 0 Unknown at PRN  . traZODone (DESYREL) 100 MG tablet Take 1-2 tablets (100-200 mg total) by mouth at bedtime. (Patient taking differently: Take 100 mg by mouth at bedtime. ) 180 tablet 1 03/04/2018 at 2000  . azithromycin (ZITHROMAX) 250 MG tablet 1 po daily (Patient not taking: Reported on 03/05/2018) 4 each 0 Not Taking at Unknown time  . diltiazem (CARDIZEM CD) 120 MG 24 hr capsule Take 1 capsule (120 mg total) by mouth daily. (Patient not taking: Reported on 03/05/2018)   Not Taking at Unknown time    Assessment: Pt on Eliquis at home for Afib,  Last dose was on 9/28 @ 1700.   MD wants to switch to lovenox until pt is alert enough to continue with Eliquis .   Goal of Therapy:  prevention of thromboembolism   Plan:  Lovenox 125 mg SQ Q24H originally ordered.   Will adjust dose to lovenox 125 mg SQ Q12H to start 9/29 @ 1700.   Murphy Bundick D 03/05/2018,4:51 PM

## 2018-03-06 LAB — GLUCOSE, CAPILLARY: Glucose-Capillary: 85 mg/dL (ref 70–99)

## 2018-03-06 LAB — CBC
HCT: 36.2 % (ref 35.0–47.0)
Hemoglobin: 12.1 g/dL (ref 12.0–16.0)
MCH: 30.6 pg (ref 26.0–34.0)
MCHC: 33.4 g/dL (ref 32.0–36.0)
MCV: 91.6 fL (ref 80.0–100.0)
Platelets: 151 10*3/uL (ref 150–440)
RBC: 3.95 MIL/uL (ref 3.80–5.20)
RDW: 14.7 % — ABNORMAL HIGH (ref 11.5–14.5)
WBC: 12.5 10*3/uL — ABNORMAL HIGH (ref 3.6–11.0)

## 2018-03-06 LAB — BLOOD CULTURE ID PANEL (REFLEXED)
ACINETOBACTER BAUMANNII: NOT DETECTED
CANDIDA ALBICANS: NOT DETECTED
CANDIDA KRUSEI: NOT DETECTED
CANDIDA PARAPSILOSIS: NOT DETECTED
Candida glabrata: NOT DETECTED
Candida tropicalis: NOT DETECTED
ENTEROBACTER CLOACAE COMPLEX: NOT DETECTED
ENTEROBACTERIACEAE SPECIES: NOT DETECTED
ENTEROCOCCUS SPECIES: NOT DETECTED
ESCHERICHIA COLI: NOT DETECTED
Haemophilus influenzae: NOT DETECTED
Klebsiella oxytoca: NOT DETECTED
Klebsiella pneumoniae: NOT DETECTED
Listeria monocytogenes: NOT DETECTED
Neisseria meningitidis: NOT DETECTED
Proteus species: NOT DETECTED
Pseudomonas aeruginosa: NOT DETECTED
STREPTOCOCCUS PYOGENES: NOT DETECTED
STREPTOCOCCUS SPECIES: DETECTED — AB
Serratia marcescens: NOT DETECTED
Staphylococcus aureus (BCID): NOT DETECTED
Staphylococcus species: NOT DETECTED
Streptococcus agalactiae: NOT DETECTED
Streptococcus pneumoniae: DETECTED — AB

## 2018-03-06 LAB — BASIC METABOLIC PANEL
Anion gap: 8 (ref 5–15)
BUN: 25 mg/dL — ABNORMAL HIGH (ref 8–23)
CO2: 32 mmol/L (ref 22–32)
Calcium: 9.2 mg/dL (ref 8.9–10.3)
Chloride: 91 mmol/L — ABNORMAL LOW (ref 98–111)
Creatinine, Ser: 0.88 mg/dL (ref 0.44–1.00)
Glucose, Bld: 105 mg/dL — ABNORMAL HIGH (ref 70–99)
Potassium: 4.2 mmol/L (ref 3.5–5.1)
Sodium: 131 mmol/L — ABNORMAL LOW (ref 135–145)

## 2018-03-06 LAB — PROCALCITONIN: PROCALCITONIN: 0.15 ng/mL

## 2018-03-06 MED ORDER — METHYLPREDNISOLONE SODIUM SUCC 40 MG IJ SOLR
40.0000 mg | Freq: Two times a day (BID) | INTRAMUSCULAR | Status: DC
  Administered 2018-03-06 – 2018-03-07 (×4): 40 mg via INTRAVENOUS
  Filled 2018-03-06 (×4): qty 1

## 2018-03-06 MED ORDER — DIVALPROEX SODIUM 250 MG PO DR TAB
250.0000 mg | DELAYED_RELEASE_TABLET | Freq: Two times a day (BID) | ORAL | Status: DC
  Administered 2018-03-06 – 2018-03-15 (×18): 250 mg via ORAL
  Filled 2018-03-06 (×21): qty 1

## 2018-03-06 MED ORDER — RISPERIDONE 1 MG PO TABS
1.0000 mg | ORAL_TABLET | Freq: Every day | ORAL | Status: DC
  Administered 2018-03-06 – 2018-03-19 (×14): 1 mg via ORAL
  Filled 2018-03-06 (×16): qty 1

## 2018-03-06 MED ORDER — ASPIRIN EC 81 MG PO TBEC
81.0000 mg | DELAYED_RELEASE_TABLET | Freq: Every day | ORAL | Status: DC
  Administered 2018-03-06 – 2018-03-20 (×15): 81 mg via ORAL
  Filled 2018-03-06 (×15): qty 1

## 2018-03-06 MED ORDER — AMIODARONE HCL 200 MG PO TABS
200.0000 mg | ORAL_TABLET | Freq: Two times a day (BID) | ORAL | Status: DC
  Administered 2018-03-06 – 2018-03-20 (×29): 200 mg via ORAL
  Filled 2018-03-06 (×29): qty 1

## 2018-03-06 MED ORDER — APIXABAN 5 MG PO TABS
5.0000 mg | ORAL_TABLET | Freq: Two times a day (BID) | ORAL | Status: DC
  Administered 2018-03-06 – 2018-03-20 (×28): 5 mg via ORAL
  Filled 2018-03-06 (×29): qty 1

## 2018-03-06 MED ORDER — HYDROMORPHONE HCL 1 MG/ML IJ SOLN
0.5000 mg | INTRAMUSCULAR | Status: DC | PRN
  Administered 2018-03-06: 0.5 mg via INTRAVENOUS
  Administered 2018-03-06: 1 mg via INTRAVENOUS
  Filled 2018-03-06 (×2): qty 1

## 2018-03-06 MED ORDER — SODIUM CHLORIDE 0.9 % IV SOLN
2.0000 g | INTRAVENOUS | Status: DC
  Administered 2018-03-07 – 2018-03-14 (×8): 2 g via INTRAVENOUS
  Filled 2018-03-06 (×7): qty 2
  Filled 2018-03-06: qty 20
  Filled 2018-03-06: qty 2
  Filled 2018-03-06: qty 20

## 2018-03-06 MED ORDER — GABAPENTIN 600 MG PO TABS
600.0000 mg | ORAL_TABLET | Freq: Every day | ORAL | Status: DC
  Administered 2018-03-06 – 2018-03-19 (×14): 600 mg via ORAL
  Filled 2018-03-06 (×15): qty 1

## 2018-03-06 MED ORDER — BUDESONIDE 0.5 MG/2ML IN SUSP
0.5000 mg | Freq: Two times a day (BID) | RESPIRATORY_TRACT | Status: DC
  Administered 2018-03-06 – 2018-03-20 (×26): 0.5 mg via RESPIRATORY_TRACT
  Filled 2018-03-06 (×28): qty 2

## 2018-03-06 MED ORDER — BUSPIRONE HCL 10 MG PO TABS
10.0000 mg | ORAL_TABLET | Freq: Three times a day (TID) | ORAL | Status: DC
  Administered 2018-03-06 – 2018-03-20 (×43): 10 mg via ORAL
  Filled 2018-03-06 (×48): qty 1

## 2018-03-06 MED ORDER — SODIUM CHLORIDE 0.9 % IV SOLN
2.0000 g | INTRAVENOUS | Status: DC
  Administered 2018-03-06: 2 g via INTRAVENOUS
  Filled 2018-03-06 (×2): qty 20

## 2018-03-06 MED ORDER — GABAPENTIN 100 MG PO CAPS
100.0000 mg | ORAL_CAPSULE | Freq: Two times a day (BID) | ORAL | Status: DC
  Administered 2018-03-06 – 2018-03-20 (×28): 100 mg via ORAL
  Filled 2018-03-06 (×27): qty 1

## 2018-03-06 MED ORDER — ALBUTEROL SULFATE (2.5 MG/3ML) 0.083% IN NEBU
2.5000 mg | INHALATION_SOLUTION | RESPIRATORY_TRACT | Status: DC
  Administered 2018-03-06 – 2018-03-10 (×23): 2.5 mg via RESPIRATORY_TRACT
  Filled 2018-03-06 (×24): qty 3

## 2018-03-06 MED ORDER — DILTIAZEM HCL ER COATED BEADS 120 MG PO CP24
120.0000 mg | ORAL_CAPSULE | Freq: Every day | ORAL | Status: DC
  Administered 2018-03-06 – 2018-03-15 (×8): 120 mg via ORAL
  Filled 2018-03-06 (×10): qty 1

## 2018-03-06 MED ORDER — DIGOXIN 125 MCG PO TABS
0.0625 mg | ORAL_TABLET | Freq: Every day | ORAL | Status: DC
  Administered 2018-03-06 – 2018-03-20 (×15): 0.0625 mg via ORAL
  Filled 2018-03-06 (×15): qty 0.5

## 2018-03-06 NOTE — Care Management (Signed)
RNCM checking with [facility] regarding bipap coverage for home when that time comes.

## 2018-03-06 NOTE — Progress Notes (Signed)
PHARMACY - PHYSICIAN COMMUNICATION CRITICAL VALUE ALERT - BLOOD CULTURE IDENTIFICATION (BCID)  [name] is an 77 y.o. female who presented to [facility] on 03/05/2018 with a chief complaint of code sepsis  Assessment:  Tachycardic, WBC 13.7, CXR infiltrate, 4/4 GPC BCID strep pneumonia   Name of physician (or Provider) Contacted: Harless Nakayama  Current antibiotics: Vanc/cefepime  Changes to prescribed antibiotics recommended:  Recommendations accepted by provider Will switch to ceftriaxone 2g IV q24h  Results for orders placed or performed during the hospital encounter of 03/05/18  Blood Culture ID Panel (Reflexed) (Collected: 03/05/2018 12:23 PM)  Result Value Ref Range   Enterococcus species NOT DETECTED NOT DETECTED   Listeria monocytogenes NOT DETECTED NOT DETECTED   Staphylococcus species NOT DETECTED NOT DETECTED   Staphylococcus aureus NOT DETECTED NOT DETECTED   Streptococcus species DETECTED (A) NOT DETECTED   Streptococcus agalactiae NOT DETECTED NOT DETECTED   Streptococcus pneumoniae DETECTED (A) NOT DETECTED   Streptococcus pyogenes NOT DETECTED NOT DETECTED   Acinetobacter baumannii NOT DETECTED NOT DETECTED   Enterobacteriaceae species NOT DETECTED NOT DETECTED   Enterobacter cloacae complex NOT DETECTED NOT DETECTED   Escherichia coli NOT DETECTED NOT DETECTED   Klebsiella oxytoca NOT DETECTED NOT DETECTED   Klebsiella pneumoniae NOT DETECTED NOT DETECTED   Proteus species NOT DETECTED NOT DETECTED   Serratia marcescens NOT DETECTED NOT DETECTED   Haemophilus influenzae NOT DETECTED NOT DETECTED   Neisseria meningitidis NOT DETECTED NOT DETECTED   Pseudomonas aeruginosa NOT DETECTED NOT DETECTED   Candida albicans NOT DETECTED NOT DETECTED   Candida glabrata NOT DETECTED NOT DETECTED   Candida krusei NOT DETECTED NOT DETECTED   Candida parapsilosis NOT DETECTED NOT DETECTED   Candida tropicalis NOT DETECTED NOT DETECTED   Tobie Lords, PharmD,  BCPS Clinical Pharmacist 03/06/2018

## 2018-03-06 NOTE — Progress Notes (Signed)
Patient ID: Vicki Morales, female   DOB: 1941/02/03, 77 y.o.   MRN: [id number]  [employer] PROGRESS NOTE  Vicki Morales TXM:[id number] DOB: Aug 02, 1940 DOA: 03/05/2018 PCP: Birdie Sons, MD  HPI/Subjective: Patient seen while on BiPAP.  Some shortness of breath and cough. Patient found to have a positive blood culture with Streptococcus pneumoniae  Objective: Vitals:   03/06/18 1154 03/06/18 1200  BP:  118/87  Pulse:  (!) 111  Resp:  (!) 21  Temp:  98.9 F (37.2 C)  SpO2: 95% 95%    Filed Weights   03/05/18 1125 03/05/18 1647 03/06/18 0500  Weight: 126.1 kg 123.5 kg 122.9 kg    ROS: Review of Systems  Unable to perform ROS: Acuity of condition  Respiratory: Positive for cough and shortness of breath.   Cardiovascular: Negative for chest pain.  Gastrointestinal: Negative for abdominal pain, constipation, diarrhea, nausea and vomiting.   Exam: Physical Exam  HENT:  Nose: No mucosal edema.  Mouth/Throat: No oropharyngeal exudate or posterior oropharyngeal edema.  Eyes: Pupils are equal, round, and reactive to light. Conjunctivae, EOM and lids are normal.  Neck: No JVD present. Carotid bruit is not present. No edema present. No thyroid mass and no thyromegaly present.  Cardiovascular: S1 normal and S2 normal. Tachycardia present. Exam reveals no gallop.  No murmur heard. Pulses:      Dorsalis pedis pulses are 2+ on the right side, and 2+ on the left side.  Respiratory: No respiratory distress. She has decreased breath sounds in the right middle field, the right lower field, the left middle field and the left lower field. She has wheezes in the right middle field and the left middle field. She has rhonchi in the right lower field and the left lower field. She has no rales.  GI: Soft. Bowel sounds are normal. There is no tenderness.  Musculoskeletal:       Right ankle: She exhibits swelling.       Left ankle: She exhibits swelling.  Lymphadenopathy:    She has no  cervical adenopathy.  Neurological: She is alert. No cranial nerve deficit.  Skin: Skin is warm. No rash noted. Nails show no clubbing.  Psychiatric: She has a normal mood and affect.      Data Reviewed: Basic Metabolic Panel: Recent Labs  Lab 03/05/18 1124 03/06/18 0439  NA 131* 131*  K 4.4 4.2  CL 90* 91*  CO2 32 32  GLUCOSE 135* 105*  BUN 19 25*  CREATININE 0.71 0.88  CALCIUM 9.4 9.2   Liver Function Tests: Recent Labs  Lab 03/05/18 1124  AST 13*  ALT 12  ALKPHOS 42  BILITOT 0.9  PROT 5.7*  ALBUMIN 2.8*   CBC: Recent Labs  Lab 03/05/18 1124 03/06/18 0439  WBC 13.7* 12.5*  NEUTROABS 12.3*  --   HGB 12.0 12.1  HCT 35.8 36.2  MCV 94.5 91.6  PLT 130* 151   Cardiac Enzymes: Recent Labs  Lab 03/05/18 1124  TROPONINI <0.03   BNP (last 3 results) Recent Labs    03/05/18 1124  BNP 213.0*    CBG: Recent Labs  Lab 03/05/18 1646 03/06/18 0744  GLUCAP 121* 85    Recent Results (from the past 240 hour(s))  Culture, blood (routine x 2)     Status: None (Preliminary result)   Collection Time: 03/05/18 12:03 PM  Result Value Ref Range Status   Specimen Description BLOOD RIGHT EJ  Final   Special Requests   Final  BOTTLES DRAWN AEROBIC AND ANAEROBIC Blood Culture adequate volume   Culture  Setup Time   Final    GRAM POSITIVE COCCI IN BOTH AEROBIC AND ANAEROBIC BOTTLES CRITICAL RESULT CALLED TO, READ BACK BY AND VERIFIED WITH: DAVID BESANTI AT 0327 03/06/18.PMH Performed at [facility], [address] [place], [place] [address]    Culture [facility] POSITIVE COCCI  Final   Report Status PENDING  Incomplete  Culture, blood (routine x 2)     Status: None (Preliminary result)   Collection Time: 03/05/18 12:23 PM  Result Value Ref Range Status   Specimen Description BLOOD RIGHT EJ  Final   Special Requests   Final    BOTTLES DRAWN AEROBIC AND ANAEROBIC Blood Culture adequate volume   Culture  Setup Time   Final    GRAM POSITIVE COCCI IN  BOTH AEROBIC AND ANAEROBIC BOTTLES CRITICAL RESULT CALLED TO, READ BACK BY AND VERIFIED WITH: DAVID BESANTI AT 0327 03/06/18.PMH Performed at [facility], [address] [place], [place] [address]    Culture [facility] POSITIVE COCCI  Final   Report Status PENDING  Incomplete  Blood Culture ID Panel (Reflexed)     Status: Abnormal   Collection Time: 03/05/18 12:23 PM  Result Value Ref Range Status   Enterococcus species NOT DETECTED NOT DETECTED Final   Listeria monocytogenes NOT DETECTED NOT DETECTED Final   Staphylococcus species NOT DETECTED NOT DETECTED Final   Staphylococcus aureus NOT DETECTED NOT DETECTED Final   Streptococcus species DETECTED (A) NOT DETECTED Final    Comment: CRITICAL RESULT CALLED TO, READ BACK BY AND VERIFIED WITH: DAVID BESANTI AT 0327 03/06/18.PMH    Streptococcus agalactiae NOT DETECTED NOT DETECTED Final   Streptococcus pneumoniae DETECTED (A) NOT DETECTED Final    Comment: CRITICAL RESULT CALLED TO, READ BACK BY AND VERIFIED WITH: DAVID BESANTI AT 0327 03/06/18.PMH    Streptococcus pyogenes NOT DETECTED NOT DETECTED Final   Acinetobacter baumannii NOT DETECTED NOT DETECTED Final   Enterobacteriaceae species NOT DETECTED NOT DETECTED Final   Enterobacter cloacae complex NOT DETECTED NOT DETECTED Final   Escherichia coli NOT DETECTED NOT DETECTED Final   Klebsiella oxytoca NOT DETECTED NOT DETECTED Final   Klebsiella pneumoniae NOT DETECTED NOT DETECTED Final   Proteus species NOT DETECTED NOT DETECTED Final   Serratia marcescens NOT DETECTED NOT DETECTED Final   Haemophilus influenzae NOT DETECTED NOT DETECTED Final   Neisseria meningitidis NOT DETECTED NOT DETECTED Final   Pseudomonas aeruginosa NOT DETECTED NOT DETECTED Final   Candida albicans NOT DETECTED NOT DETECTED Final   Candida glabrata NOT DETECTED NOT DETECTED Final   Candida krusei NOT DETECTED NOT DETECTED Final   Candida parapsilosis NOT DETECTED NOT DETECTED Final   Candida  tropicalis NOT DETECTED NOT DETECTED Final    Comment: Performed at [facility], [address] [place], [place] [address]  MRSA PCR Screening     Status: None   Collection Time: 03/05/18  4:50 PM  Result Value Ref Range Status   MRSA by PCR NEGATIVE NEGATIVE Final    Comment:        The GeneXpert MRSA Assay (FDA approved for NASAL specimens only), is one component of a comprehensive MRSA colonization surveillance program. It is not intended to diagnose MRSA infection nor to guide or monitor treatment for MRSA infections. Performed at [facility], [address], [place], [place] [address]      Studies: Dg Chest Portable 1 View  Result Date: 03/05/2018 CLINICAL  DATA:  Recent discharge for pneumonia. EXAM: PORTABLE CHEST 1 VIEW COMPARISON:  February 19, 2018 FINDINGS: No pneumothorax. The cardiomediastinal silhouette is stable. New rounded infiltrate in the lateral right base. No other interval change. IMPRESSION: New rounded infiltrate in the lateral right lung base may represent pneumonia given history. Recommend short-term follow-up to ensure resolution. Electronically Signed   By: Dorise Bullion III M.D   On: 03/05/2018 12:24    Scheduled Meds: . albuterol  2.5 mg Nebulization Q4H  . amiodarone  200 mg Oral BID  . apixaban  5 mg Oral BID  . aspirin EC  81 mg Oral Daily  . budesonide (PULMICORT) nebulizer solution  0.5 mg Nebulization BID  . busPIRone  10 mg Oral TID  . digoxin  0.0625 mg Oral Daily  . diltiazem  120 mg Oral Daily  . divalproex  250 mg Oral Q12H  . docusate sodium  100 mg Oral BID  . gabapentin  100 mg Oral BID WC  . gabapentin  600 mg Oral QHS  . methylPREDNISolone (SOLU-MEDROL) injection  40 mg Intravenous Q12H  . risperiDONE  1 mg Oral QHS   Continuous Infusions: . cefTRIAXone (ROCEPHIN)  IV 2 g (03/06/18 0537)    Assessment/Plan:  1. Sepsis with Streptococcus pneumonia.  Antibiotics changed to Rocephin 2. Acute  respiratory failure with hypoxia and hypercapnia.  Patient still on BiPAP today and has been very tenuous. 3. COPD exacerbation with poor air entry.  Started on Solu-Medrol and nebulizer treatments 4. Paroxysmal atrial fibrillation on amiodarone and Eliquis and digoxin and Cardizem 5. Anxiety depression and restless leg syndrome.  Continue psychiatric medication 6. Morbid obesity with a BMI of 46.51 7. Weakness  Code Status:     Code Status Orders  (From admission, onward)         Start     Ordered   03/05/18 1337  Do not attempt resuscitation (DNR)  Continuous    Question Answer Comment  In the event of cardiac or respiratory ARREST Do not call a "code blue"   In the event of cardiac or respiratory ARREST Do not perform Intubation, CPR, defibrillation or ACLS   In the event of cardiac or respiratory ARREST Use medication by any route, position, wound care, and other measures to relive pain and suffering. May use oxygen, suction and manual treatment of airway obstruction as needed for comfort.   Comments nurse may pronounce      03/05/18 1338        Code Status History    Date Active Date Inactive Code Status Order ID Comments User Context   02/19/2018 1709 02/26/2018 1634 DNR [id number]  Loletha Grayer, MD ED   12/03/2017 1258 12/06/2017 1847 DNR [id number]  Gladstone Lighter, MD Inpatient   10/24/2017 1844 10/26/2017 1623 DNR [id number]  Vaughan Basta, MD Inpatient   09/23/2015 0018 09/23/2015 1823 DNR [id number]  Lance Coon, MD Inpatient    Advance Directive Documentation     Most Recent Value  Type of Advance Directive  Out of facility DNR (pink MOST or yellow form)  Pre-existing out of facility DNR order (yellow form or pink MOST form)  -  "MOST" Form in Place?  -     Family Communication: As per critical care specialist Disposition Plan: To be determined  Consultants:  Critical care team  Antibiotics:  Rocephin  Time spent: 28 minutes  [place]

## 2018-03-07 LAB — PROCALCITONIN: Procalcitonin: 0.1 ng/mL

## 2018-03-07 MED ORDER — MELATONIN 10 MG PO CAPS: 10.0000 mg | ORAL_CAPSULE | Freq: Every evening | ORAL | Status: DC | PRN

## 2018-03-07 MED ORDER — ALPRAZOLAM 0.5 MG PO TABS
0.5000 mg | ORAL_TABLET | Freq: Once | ORAL | Status: AC
  Administered 2018-03-07: 0.5 mg via ORAL
  Filled 2018-03-07: qty 1

## 2018-03-07 MED ORDER — MELATONIN 5 MG PO TABS
10.0000 mg | ORAL_TABLET | Freq: Every evening | ORAL | Status: DC | PRN
  Filled 2018-03-07: qty 2

## 2018-03-07 NOTE — Progress Notes (Signed)
CRITICAL CARE NOTE  CC  follow up respiratory failure  SUBJECTIVE DNR/DNI On and off biPAP last night Prognosis is guarded +strep pneumonia bacteremia +SOB     BP (!) 97/55   Pulse (!) 159   Temp 98.7 F (37.1 C) (Axillary)   Resp (!) 27   Ht 5\' 4"  (1.626 m)   Wt 125.4 kg   SpO2 (!) 89%   BMI 47.45 kg/m    REVIEW OF SYSTEMS  Gen:  Denies  fever, sweats, chills weigh loss  HEENT: Denies blurred vision, double vision, ear pain, eye pain, hearing loss, nose bleeds, sore throat Cardiac:  No dizziness, chest pain or heaviness, chest tightness,edema, No JVD Resp:   +cough -sputum production, +shortness of breath,+wheezing, -hemoptysis,  Other:  All other systems negative   GENERAL:critically ill appearing, +minimal resp distress HEAD: Normocephalic, atraumatic.  EYES: Pupils equal, round, reactive to light.  No scleral icterus.  MOUTH: Moist mucosal membrane. NECK: Supple. No thyromegaly. No nodules. No JVD.  PULMONARY: +rhonchi, +wheezing CARDIOVASCULAR: S1 and S2. Regular rate and rhythm. No murmurs, rubs, or gallops.  GASTROINTESTINAL: Soft, nontender, -distended. No masses. Positive bowel sounds. No hepatosplenomegaly.  MUSCULOSKELETAL: No swelling, clubbing, or edema.  NEUROLOGIC: lethargic but arousable SKIN:intact,warm,dry       ASSESSMENT AND PLAN SYNOPSIS 77 yo F with acute resp failure on biPAP from R lung pneumonia with strep pneumonia bactermia  Severe Hypoxic and Hypercapnic Respiratory Failure -biPAP as needed -continue Bronchodilator Therapy -Wean Fio2  DNR/DNI  CARDIAC ICU monitoring  ID-R lung pneumonia -continue IV abx as prescibed  GI/Nutrition GI PROPHYLAXIS as indicated DIET-->NPO Constipation protocol as indicated  ELECTROLYTES -follow labs as needed -replace as needed -pharmacy consultation and following   DVT/GI PRX ordered TRANSFUSIONS AS NEEDED MONITOR FSBS ASSESS the need for LABS as needed   Prognosis is  guarded  Corrin Parker, M.D.  [facility]  Medical Director [place] Director [place] Department

## 2018-03-07 NOTE — Progress Notes (Signed)
Swallow test and PT evaluation ordered for today.  Swallow test and PT evaluation not assessed.  Patient has been off BiPap most of the day and has tolerated well.

## 2018-03-07 NOTE — Progress Notes (Signed)
PT Cancellation Note  Patient Details Name: Vicki Morales MRN: [id number] DOB: Jan 16, 1941   Cancelled Treatment:    Reason Eval/Treat Not Completed: Medical issues which prohibited therapy. Consult received and chart reviewed. Pt currently on/off bipap and has elevated vitals. HR at 159 and BP at 97/55. Not appropriate for PT intervention at this time, will hold until medically stable.   [place] Sorn 03/07/2018, 10:10 AM Greggory Stallion, PT, DPT [phone]

## 2018-03-07 NOTE — Progress Notes (Signed)
Patient ID: Vicki Morales, female   DOB: December 30, 1940, 77 y.o.   MRN: [id number]  [employer] PROGRESS NOTE  Vicki Morales YJE:[id number] DOB: Mar 31, 1941 DOA: 03/05/2018 PCP: Birdie Sons, MD  HPI/Subjective: Patient seen once off BiPAP.  Still complaining of some cough and shortness of breath.  Objective: Vitals:   03/07/18 1132 03/07/18 1200  BP:  (!) 170/80  Pulse: (!) 101 (!) 103  Resp:  (!) 22  Temp:  98.6 F (37 C)  SpO2:  91%    Filed Weights   03/05/18 1647 03/06/18 0500 03/07/18 0406  Weight: 123.5 kg 122.9 kg 125.4 kg    ROS: Review of Systems  Unable to perform ROS: Acuity of condition  Constitutional: Positive for malaise/fatigue. Negative for chills and fever.  Eyes: Negative for blurred vision.  Respiratory: Positive for cough and shortness of breath.   Cardiovascular: Negative for chest pain.  Gastrointestinal: Negative for abdominal pain, constipation, diarrhea, nausea and vomiting.  Genitourinary: Negative for dysuria.  Musculoskeletal: Negative for joint pain.  Neurological: Negative for dizziness and headaches.   Exam: Physical Exam  HENT:  Nose: No mucosal edema.  Mouth/Throat: No oropharyngeal exudate or posterior oropharyngeal edema.  Eyes: Pupils are equal, round, and reactive to light. Conjunctivae, EOM and lids are normal.  Neck: No JVD present. Carotid bruit is not present. No edema present. No thyroid mass and no thyromegaly present.  Cardiovascular: S1 normal and S2 normal. Tachycardia present. Exam reveals no gallop.  No murmur heard. Pulses:      Dorsalis pedis pulses are 2+ on the right side, and 2+ on the left side.  Respiratory: No respiratory distress. She has decreased breath sounds in the right middle field, the right lower field, the left middle field and the left lower field. She has wheezes in the right middle field and the left middle field. She has rhonchi in the right lower field and the left lower field. She has no  rales.  GI: Soft. Bowel sounds are normal. There is no tenderness.  Musculoskeletal:       Right ankle: She exhibits swelling.       Left ankle: She exhibits swelling.  Lymphadenopathy:    She has no cervical adenopathy.  Neurological: She is alert. No cranial nerve deficit.  Skin: Skin is warm. No rash noted. Nails show no clubbing.  Psychiatric: She has a normal mood and affect.      Data Reviewed: Basic Metabolic Panel: Recent Labs  Lab 03/05/18 1124 03/06/18 0439  NA 131* 131*  K 4.4 4.2  CL 90* 91*  CO2 32 32  GLUCOSE 135* 105*  BUN 19 25*  CREATININE 0.71 0.88  CALCIUM 9.4 9.2   Liver Function Tests: Recent Labs  Lab 03/05/18 1124  AST 13*  ALT 12  ALKPHOS 42  BILITOT 0.9  PROT 5.7*  ALBUMIN 2.8*   CBC: Recent Labs  Lab 03/05/18 1124 03/06/18 0439  WBC 13.7* 12.5*  NEUTROABS 12.3*  --   HGB 12.0 12.1  HCT 35.8 36.2  MCV 94.5 91.6  PLT 130* 151   Cardiac Enzymes: Recent Labs  Lab 03/05/18 1124  TROPONINI <0.03   BNP (last 3 results) Recent Labs    03/05/18 1124  BNP 213.0*    CBG: Recent Labs  Lab 03/05/18 1646 03/06/18 0744  GLUCAP 121* 85    Recent Results (from the past 240 hour(s))  Culture, blood (routine x 2)     Status: Abnormal (Preliminary result)  Collection Time: 03/05/18 12:03 PM  Result Value Ref Range Status   Specimen Description   Final    BLOOD RIGHT EJ Performed at [facility], [address], [place], [place] [address]    Special Requests   Final    BOTTLES DRAWN AEROBIC AND ANAEROBIC Blood Culture adequate volume Performed at [facility], [address] [place], [place] [address]    Culture  Setup Time   Final    GRAM POSITIVE COCCI IN BOTH AEROBIC AND ANAEROBIC BOTTLES CRITICAL RESULT CALLED TO, READ BACK BY AND VERIFIED WITH: DAVID BESANTI AT 0327 03/06/18.PMH Performed at [facility], [address], [place], [place] [address]    Culture STREPTOCOCCUS PNEUMONIAE (A)   Final   Report Status PENDING  Incomplete  Culture, blood (routine x 2)     Status: Abnormal (Preliminary result)   Collection Time: 03/05/18 12:23 PM  Result Value Ref Range Status   Specimen Description   Final    BLOOD RIGHT EJ Performed at [facility], [address] [place], [place] [address]    Special Requests   Final    BOTTLES DRAWN AEROBIC AND ANAEROBIC Blood Culture adequate volume Performed at [facility], [address] [place], [place] [address]    Culture  Setup Time   Final    GRAM POSITIVE COCCI IN BOTH AEROBIC AND ANAEROBIC BOTTLES CRITICAL RESULT CALLED TO, READ BACK BY AND VERIFIED WITH: [place] 03/06/18.PMH Performed at [facility], [address] [place], [place] [address]    Culture STREPTOCOCCUS PNEUMONIAE (A)  Final   Report Status PENDING  Incomplete  Blood Culture ID Panel (Reflexed)     Status: Abnormal   Collection Time: 03/05/18 12:23 PM  Result Value Ref Range Status   Enterococcus species NOT DETECTED NOT DETECTED Final   Listeria monocytogenes NOT DETECTED NOT DETECTED Final   Staphylococcus species NOT DETECTED NOT DETECTED Final   Staphylococcus aureus NOT DETECTED NOT DETECTED Final   Streptococcus species DETECTED (A) NOT DETECTED Final    Comment: CRITICAL RESULT CALLED TO, READ BACK BY AND VERIFIED WITH: DAVID BESANTI AT 0327 03/06/18.PMH    Streptococcus agalactiae NOT DETECTED NOT DETECTED Final   Streptococcus pneumoniae DETECTED (A) NOT DETECTED Final    Comment: CRITICAL RESULT CALLED TO, READ BACK BY AND VERIFIED WITH: DAVID BESANTI AT 0327 03/06/18.PMH    Streptococcus pyogenes NOT DETECTED NOT DETECTED Final   Acinetobacter baumannii NOT DETECTED NOT DETECTED Final   Enterobacteriaceae species NOT DETECTED NOT DETECTED Final   Enterobacter cloacae complex NOT DETECTED NOT DETECTED Final   Escherichia coli NOT DETECTED NOT DETECTED Final   Klebsiella oxytoca NOT DETECTED NOT  DETECTED Final   Klebsiella pneumoniae NOT DETECTED NOT DETECTED Final   Proteus species NOT DETECTED NOT DETECTED Final   Serratia marcescens NOT DETECTED NOT DETECTED Final   Haemophilus influenzae NOT DETECTED NOT DETECTED Final   Neisseria meningitidis NOT DETECTED NOT DETECTED Final   Pseudomonas aeruginosa NOT DETECTED NOT DETECTED Final   Candida albicans NOT DETECTED NOT DETECTED Final   Candida glabrata NOT DETECTED NOT DETECTED Final   Candida krusei NOT DETECTED NOT DETECTED Final   Candida parapsilosis NOT DETECTED NOT DETECTED Final   Candida tropicalis NOT DETECTED NOT DETECTED Final    Comment: Performed at [facility], [address], [place], [place] [address]  MRSA PCR Screening     Status: None   Collection Time: 03/05/18  4:50 PM  Result Value Ref Range Status   MRSA by PCR NEGATIVE NEGATIVE Final    Comment:        The GeneXpert MRSA Assay (FDA approved for NASAL specimens only), is one component of a comprehensive MRSA colonization surveillance program. It is not intended to diagnose MRSA infection nor to guide or monitor treatment for MRSA infections. Performed at [facility], [address], [place], [place] [address]      Studies: No results found.  Scheduled Meds: . albuterol  2.5 mg Nebulization Q4H  . amiodarone  200 mg Oral BID  . apixaban  5 mg Oral BID  . aspirin EC  81 mg Oral Daily  . budesonide (PULMICORT) nebulizer solution  0.5 mg Nebulization BID  . busPIRone  10 mg Oral TID  . digoxin  0.0625 mg Oral Daily  . diltiazem  120 mg Oral Daily  . divalproex  250 mg Oral Q12H  . docusate sodium  100 mg Oral BID  . gabapentin  100 mg Oral BID WC  . gabapentin  600 mg Oral QHS  . methylPREDNISolone (SOLU-MEDROL) injection  40 mg Intravenous Q12H  . risperiDONE  1 mg Oral QHS   Continuous Infusions: . cefTRIAXone (ROCEPHIN)  IV 2 g (03/07/18 1147)    Assessment/Plan:  1. Sepsis with Streptococcus pneumonia.   Antibiotics changed to Rocephin 2. Acute respiratory failure with hypoxia and hypercapnia.  Patient taken off BiPAP and on nasal cannula.  Respiratory status still tenuous and likely will be watched another day in the ICU. 3. COPD exacerbation with poor air entry.  Started on Solu-Medrol and nebulizer treatments 4. Paroxysmal atrial fibrillation on amiodarone and Eliquis and digoxin and Cardizem 5. Anxiety depression and restless leg syndrome.  Continue psychiatric medication 6. Morbid obesity with a BMI of 47.45 7. Weakness  Code Status:     Code Status Orders  (From admission, onward)         Start     Ordered   03/05/18 1337  Do not attempt resuscitation (DNR)  Continuous    Question Answer Comment  In the event of cardiac or respiratory ARREST Do not call a "code blue"   In the event of cardiac or respiratory ARREST Do not perform Intubation, CPR, defibrillation or ACLS   In the event of cardiac or respiratory ARREST Use medication by any route, position, wound care, and other measures to relive pain and suffering. May use oxygen, suction and manual treatment of airway obstruction as needed for comfort.   Comments nurse may pronounce      03/05/18 1338        Code Status History    Date Active Date Inactive Code Status Order ID Comments User Context   02/19/2018 1709 02/26/2018 1634 DNR [id number]  Loletha Grayer, MD ED   12/03/2017 1258 12/06/2017 1847 DNR [id number]  Gladstone Lighter, MD Inpatient   10/24/2017 1844 10/26/2017 1623 DNR [id number]  Vaughan Basta, MD Inpatient   09/23/2015 0018 09/23/2015 1823 DNR [id number]  Lance Coon, MD Inpatient    Advance Directive Documentation     Most Recent Value  Type of Advance Directive  Out of facility DNR (pink MOST or yellow form)  Pre-existing out of facility DNR order (yellow form or pink MOST form)  -  "MOST" Form in Place?  -     Family Communication: As per critical care specialist Disposition Plan: To be  determined  Consultants:  Critical care team  Antibiotics:  Rocephin  Time spent:  26 minutes  [place]

## 2018-03-08 LAB — CBC
HEMATOCRIT: 34.8 % — AB (ref 35.0–47.0)
HEMOGLOBIN: 12.1 g/dL (ref 12.0–16.0)
MCH: 32.1 pg (ref 26.0–34.0)
MCHC: 34.7 g/dL (ref 32.0–36.0)
MCV: 92.4 fL (ref 80.0–100.0)
Platelets: 154 10*3/uL (ref 150–440)
RBC: 3.76 MIL/uL — ABNORMAL LOW (ref 3.80–5.20)
RDW: 15.1 % — AB (ref 11.5–14.5)
WBC: 4.7 10*3/uL (ref 3.6–11.0)

## 2018-03-08 LAB — BASIC METABOLIC PANEL
ANION GAP: 8 (ref 5–15)
BUN: 40 mg/dL — ABNORMAL HIGH (ref 8–23)
CALCIUM: 8.8 mg/dL — AB (ref 8.9–10.3)
CO2: 33 mmol/L — AB (ref 22–32)
Chloride: 94 mmol/L — ABNORMAL LOW (ref 98–111)
Creatinine, Ser: 0.89 mg/dL (ref 0.44–1.00)
GFR calc non Af Amer: >60 mL/min (ref >60)
Glucose, Bld: 121 mg/dL — ABNORMAL HIGH (ref 70–99)
Potassium: 4.7 mmol/L (ref 3.5–5.1)
SODIUM: 135 mmol/L (ref 135–145)

## 2018-03-08 MED ORDER — METHYLPREDNISOLONE SODIUM SUCC 40 MG IJ SOLR
40.0000 mg | Freq: Every day | INTRAMUSCULAR | Status: DC
  Administered 2018-03-09 – 2018-03-20 (×12): 40 mg via INTRAVENOUS
  Filled 2018-03-08 (×12): qty 1

## 2018-03-08 MED ORDER — HYDRALAZINE HCL 20 MG/ML IJ SOLN: 10.0000 mg | Freq: Four times a day (QID) | INTRAMUSCULAR | Status: DC | PRN

## 2018-03-08 MED ORDER — TRAZODONE HCL 50 MG PO TABS
100.0000 mg | ORAL_TABLET | Freq: Every evening | ORAL | Status: DC | PRN
  Administered 2018-03-08 – 2018-03-09 (×2): 100 mg via ORAL
  Filled 2018-03-08: qty 2
  Filled 2018-03-08: qty 1

## 2018-03-08 MED ORDER — GUAIFENESIN-DM 100-10 MG/5ML PO SYRP
5.0000 mL | ORAL_SOLUTION | ORAL | Status: DC | PRN
  Filled 2018-03-08: qty 5

## 2018-03-08 MED ORDER — ACETYLCYSTEINE 20 % IN SOLN
2.0000 mL | Freq: Three times a day (TID) | RESPIRATORY_TRACT | Status: DC
  Administered 2018-03-08 – 2018-03-09 (×5): 2 mL via RESPIRATORY_TRACT
  Administered 2018-03-09: 4 mL via RESPIRATORY_TRACT
  Administered 2018-03-10 – 2018-03-13 (×11): 2 mL via RESPIRATORY_TRACT
  Filled 2018-03-08 (×18): qty 4

## 2018-03-08 MED ORDER — MENTHOL 3 MG MT LOZG
1.0000 | LOZENGE | OROMUCOSAL | Status: DC | PRN
  Filled 2018-03-08: qty 9

## 2018-03-08 NOTE — Evaluation (Addendum)
Clinical/Bedside Swallow Evaluation Patient Details  Name: Vicki Morales MRN: [id number] Date of Birth: 02/29/1941  Today's Date: 03/08/2018 Time: SLP Start Time (ACUTE ONLY): 1340 SLP Stop Time (ACUTE ONLY): 1440 SLP Time Calculation (min) (ACUTE ONLY): 60 min  Past Medical History:  Past Medical History:  Diagnosis Date  . Anxiety   . Arthritis   . Cataract    right eye  . COPD (chronic obstructive pulmonary disease) ([place])   . Depression   . Dyspnea    DOE  . Dysrhythmia   . Edema    FEET/ANKLES  . GERD (gastroesophageal reflux disease)   . H/O wheezing   . History of hiatal hernia   . HOH (hard of hearing)   . Hypertension   . Incontinence of urine in female   . Orthopnea   . Pain    CHRONIC KNEE  . Pain    CHRONIC KNEE  . Paranoid disorder ([place])   . RLS (restless legs syndrome)   . Tremors of nervous system    HANDS   Past Surgical History:  Past Surgical History:  Procedure Laterality Date  . ABDOMINAL HYSTERECTOMY  1988  . APPENDECTOMY  1988  . CARDIAC CATHETERIZATION    . CATARACT EXTRACTION W/PHACO Right 07/14/2016   Procedure: CATARACT EXTRACTION PHACO AND INTRAOCULAR LENS PLACEMENT ([place]) anterior vitrectomy;  Surgeon: Estill Cotta, MD;  Location: [facility] ORS;  Service: Ophthalmology;  Laterality: Right;  [place] 02:40 AP% 24.4 CDE 59.98 Fluid pack # [id number]  . CATARACT EXTRACTION W/PHACO Left 02/16/2017   Procedure: CATARACT EXTRACTION PHACO AND INTRAOCULAR LENS PLACEMENT (IOC);  Surgeon: Estill Cotta, MD;  Location: [facility] ORS;  Service: Ophthalmology;  Laterality: Left;  Lot # [id number] H [place]: 01:11.8 AP%: 23.5 CDE: 32.09   . ESOPHAGOGASTRODUODENOSCOPY N/A 11/11/2014   Procedure: ESOPHAGOGASTRODUODENOSCOPY (EGD);  Surgeon: Josefine Class, MD;  Location: [facility] ENDOSCOPY;  Service: Endoscopy;  Laterality: N/A;  . TONSILLECTOMY AND ADENOIDECTOMY    . TRIGGER FINGER RELEASE  2004   HPI:  Pt is a 77 y.o. female w/ Multiple medical issues including  Anxiety/Depression, Paranoid disorder, h/o Wheezing, Hiatal Hernia, HTN, edema, and COPD who stated that she is having lots of abdominal pain.  This is been going on for 2 to 3 weeks.  She finished a course of antibiotics for diverticulitis.  She has been having lots of diarrhea.  Last night she took an Imodium.  Abdominal pain described as 6-7 out of 10 in intensity.  Movement makes it worse.  She has not been eating very well.  She has been having some swelling all over her body.  States her breathing is now acting up.  She is short of breath and wheezing and coughing up some yellow phlegm.  She is nauseous but not having any vomiting.  She has had some weight loss and decreased appetite. Pt was admitted w/ +strep pneumonia bactermia.  Pt seemed easily distracted during this session.    Assessment / Plan / Recommendation Clinical Impression  Pt appears to present w/ adequate oropharyngeal phase swallow function w/ reduced risk for aspiration when following general aspiration precautions. Pt does present w/ decline in respiratory status at baseline(COPD) w/ need for BIPAP at this hospitalization and is min SOB w/ any exertion including talking. This can increase risk for aspiration, choking. Pt also has a baseline of Hernia and GERD. Of note, ANY pt w/ ongoing issues of Esophageal dysmotility is at increased risk for aspiration Regurgitated Reflux material thus impact on the Pulmonary  status.  W/ verbal cues, pt attended to, and consumed, trials of thin liquids via Cup and Straw  then trials of puree and mech soft food trials w/ no overt s/s of aspiration noted; no decline in vocal quality or respiratory status during/post trials. Pt's oral phase was [facility] for bolus management and oral clearing w/ all trial consistencies. Ensured the drier food was moistened for easier mastication/swallowing. Encouraged pt to Slow Down during eating/drinking to lessen exertion/SOB - take rest breaks as needed. Pt fed self w/ min  assistance for setup; needed min verbal cues slow down when feeding self as she tended to eat/drink quickly. Recommend a more mech soft diet(at this time to lessen exertion) w/ thin liquids; general aspiration precautions. Encouraged pt/NSG to reduce distractions in the room during the meals to allow pt to focus on the meal w/out increased talking thus lessening SOB w/ the exertion of the meal. No further skilled [place] indicated at this time as pt appears at her baseline re: swallowing. NSG agreed.  SLP Visit Diagnosis: Dysphagia, unspecified (R13.10)    Aspiration Risk  (reduced following general aspiration precautions)    Diet Recommendation  Mech Soft diet w/ Thin liquids(moist foods, cut meats). General aspiration precautions w/ rest breaks to lessen exertion/SOB. Reflux precautions.  Medication Administration: Whole meds with puree(if needed for easier swallowing)    Other  Recommendations Recommended Consults: (Dietician f/u) Oral Care Recommendations: Oral care BID;Patient independent with oral care;Staff/trained caregiver to provide oral care Other Recommendations: (n/a)   Follow up Recommendations None      Frequency and Duration (n/a)  (n/a)       Prognosis Prognosis for Safe Diet Advancement: Good Barriers to Reach Goals: (declined respiratory status at this time)      Swallow Study   General Date of Onset: 03/05/18 HPI: Pt is a 77 y.o. female w/ Multiple medical issues including Anxiety/Depression, Paranoid disorder, h/o Wheezing, Hiatal Hernia, HTN, edema, and COPD who stated that she is having lots of abdominal pain.  This is been going on for 2 to 3 weeks.  She finished a course of antibiotics for diverticulitis.  She has been having lots of diarrhea.  Last night she took an Imodium.  Abdominal pain described as 6-7 out of 10 in intensity.  Movement makes it worse.  She has not been eating very well.  She has been having some swelling all over her body.  States her  breathing is now acting up.  She is short of breath and wheezing and coughing up some yellow phlegm.  She is nauseous but not having any vomiting.  She has had some weight loss and decreased appetite. Pt was admitted w/ +strep pneumonia bactermia.  Pt seemed easily distracted during this session.  Type of Study: Bedside Swallow Evaluation Previous Swallow Assessment: none noted Diet Prior to this Study: NPO(but regular diet at home per pt) Temperature Spikes Noted: No(wbc 4.7) Respiratory Status: Nasal cannula(3-4 liters post BIPAP) History of Recent Intubation: No Behavior/Cognition: Alert;Cooperative;Pleasant mood;Distractible;Requires cueing Oral Cavity Assessment: Dry Oral Care Completed by SLP: Recent completion by staff Oral Cavity - Dentition: Dentures, top;Dentures, bottom(partial) Vision: Functional for self-feeding Self-Feeding Abilities: Able to feed self;Needs set up Patient Positioning: Upright in bed Baseline Vocal Quality: Normal(min increased SOB) Volitional Cough: Strong;Congested Volitional Swallow: Able to elicit    Oral/Motor/Sensory Function Overall Oral Motor/Sensory Function: Within functional limits   Ice Chips Ice chips: Within functional limits Presentation: Spoon(fed; 2 trials)   Thin Liquid Thin Liquid:  Within functional limits Presentation: Cup;Self Fed;Straw(~6 ozs total) Other Comments: drank quickly    Nectar Thick Nectar Thick Liquid: Not tested   Honey Thick Honey Thick Liquid: Not tested   Puree Puree: Within functional limits Presentation: Self Fed;Spoon(4 ozs) Other Comments: ate quickly   Solid     Solid: Within functional limits Presentation: Self Fed;Spoon(5 trials) Other Comments: min extra time to fully clear bolus material      Orinda Kenner, MS, CCC-SLP [place] 03/08/2018,3:26 PM

## 2018-03-08 NOTE — Progress Notes (Signed)
   CHIEF COMPLAINT:   Follow up resp failure   Subjective  DNR/DNI Off biPAP SOB improved Minimal oxygen +strep pneumonia bactermia     Objective   VITALS: BP 122/70 (BP Location: Right Arm)   Pulse 84   Temp 98.7 F (37.1 C) (Oral)   Resp 16   Ht 5\' 4"  (1.626 m)   Wt 125.1 kg   SpO2 96%   BMI 47.34 kg/m    Review of Systems:  Gen:  Denies  fever, sweats, chills weigh loss  HEENT: Denies blurred vision, double vision, ear pain, eye pain, hearing loss, nose bleeds, sore throat Cardiac:  No dizziness, chest pain or heaviness, chest tightness,edema, No JVD Resp:   +cough +sputum production, +shortness of breath, Other:  All other systems negative  Physical Examination:   GENERAL:NAD, HEAD: Normocephalic, atraumatic.  EYES: Pupils equal, round, reactive to light. Extraocular muscles intact. No scleral icterus.  MOUTH: Moist mucosal membrane. Dentition intact. No abscess noted.  EAR, NOSE, THROAT: Clear without exudates. No external lesions.  NECK: Supple. No thyromegaly. No nodules. No JVD.  PULMONARY: CTA B/L no wheezing, rhonchi, crackles CARDIOVASCULAR: S1 and S2. Regular rate and rhythm. No murmurs, rubs, or gallops. No edema. Pedal pulses 2+ bilaterally.  GASTROINTESTINAL: Soft, nontender, nondistended. No masses. Positive bowel sounds. No hepatosplenomegaly.  MUSCULOSKELETAL: No swelling, clubbing, or edema. Range of motion full in all extremities.  NEUROLOGIC: Cranial nerves II through XII are intact. No gross focal neurological deficits. Sensation intact. Reflexes intact.  SKIN: No ulceration, lesions, rashes, or cyanosis. Skin warm and dry. Turgor intact.  PSYCHIATRIC: Mood, affect within normal limits. The patient is awake, alert and oriented x 3. Insight, judgment intact.  ALL OTHER ROS ARE NEGATIVE   I personally reviewed Labs under Results section.    77 yo F with acute resp failure requiring BIPAP now off bipap from acute R lung pneumonia with strep  pneumonia bactermia  Severe Hypoxic and Hypercapnic Respiratory Failure-slowly improving -slowly improving DNR/DNI Oxygen as needed  ID-strep pneumonia bactermia R sided pneumonia -IV abx for 10-14 days  Transfer to gen med floor today     Corrin Parker, M.D.  [facility]  Medical Director [place] Director [facility] Cardio-Pulmonary Department

## 2018-03-08 NOTE — Progress Notes (Signed)
Pt sitting up in bed eating supper meal. No difficulty swallowing noted. Pt denies pain or discomfort. No acute distress noted.

## 2018-03-08 NOTE — Progress Notes (Signed)
Physical Therapy Evaluation Patient Details Name: Vicki Morales MRN: [id number] DOB: [birth year] Today's Date: 03/08/2018   History of Present Illness  Pt presented to ER on 03/05/2018 with c/c of altered mental status and was admitted for acute and chronic respiratory failure with hypercapnia. Pt is positive for strep pnuemonia bactermia, R sided pnuemonia. Pt was off and on BiPAP on 9/29-10/1 now on 4L via nasal cannula. PMH includes COPD, chronic A-fib, GERD, HTN, orthopnea, anxiety and depression, chronic knee pain, and RLS.  Clinical Impression  Pt is a pleasant 77 year old female who was admitted for acute and chronic respiratory failure with hypercapnia. Pt is not a good historian. Pt performs bed mobility with Min A, +2 from sit-to-supine d/t fatigue. Pt tolerated sitting EOB for approx. 2 minutes without sx of dizziness, very fatigued after. Vitals monitored t/o session baseline HR 88 bpm, SpO2 94%, RR 18. With activity HR 90s bpm, SpO2 88-92%, RR 18-20. Pt vitals back to baseline at end of session. Pt demonstrates deficits with strength in both UE and LEs, mobility, and endurance. Pt is not at her baseline. Would benefit from skilled PT to address above deficits and promote optimal return to PLOF.      Follow Up Recommendations SNF    Equipment Recommendations  None recommended by PT    Recommendations for Other Services       Precautions / Restrictions Precautions Precautions: Fall Restrictions Weight Bearing Restrictions: No      Mobility  Bed Mobility Overal bed mobility: Needs Assistance Bed Mobility: Supine to Sit;Sit to Supine     Supine to sit: Min assist Sit to supine: Min assist;+2 for physical assistance   General bed mobility comments: Pt demonstrates ability disassociate trunk, pull on bed hand with BUE, and perform lateral scooting in bed. Pt understands safe sequencing to come to sitting at EOB, min VC used. Min A to shift hips anteriorly to come to EOB. Pt  required +2 assist during sit to supine for LE management, pt very fatigued.  Transfers                 General transfer comment: Unable to assess d/t fatigue.  Ambulation/Gait                Stairs            Wheelchair Mobility    Modified Rankin (Stroke Patients Only)       Balance Overall balance assessment: Modified Independent Sitting-balance support: Bilateral upper extremity supported;Feet supported Sitting balance-Leahy Scale: Fair Sitting balance - Comments: Pt able to control trunk in upright posture, no dizziness reported, uses BUE on bed for support. Pt maintained position >2 min, but is very fatigued.                                     Pertinent Vitals/Pain Pain Assessment: Faces Faces Pain Scale: Hurts little more Pain Location: Unspecified by pt Pain Descriptors / Indicators: Grimacing;Discomfort Pain Intervention(s): Limited activity within patient's tolerance;Repositioned    Home Living Family/patient expects to be discharged to:: Skilled nursing facility Living Arrangements: Alone Available Help at Discharge: Family;Available PRN/intermittently Type of Home: Assisted living([facility] [place] [facility])         Home Equipment: Walker - 2 wheels Additional Comments: Pt is not a good historian.    Prior Function Level of Independence: Independent with assistive device(s)  Comments: Pt is not a good historian. She states that she amb with RW in home, and performs ADL without assist. Unable to state whether she had been receiving rehab therapies.     Hand Dominance   Dominant Hand: Right    Extremity/Trunk Assessment   Upper Extremity Assessment Upper Extremity Assessment: Generalized weakness(BUE 3/5)    Lower Extremity Assessment Lower Extremity Assessment: Generalized weakness(BLE 3/5)       Communication   Communication: HOH  Cognition Arousal/Alertness: Awake/alert Behavior During  Therapy: WFL for tasks assessed/performed Overall Cognitive Status: No family/caregiver present to determine baseline cognitive functioning                                 General Comments: Pt is A&Ox4, memory impaired unable to recall living situation fully, unable to recall any events from yesterday.      General Comments      Exercises Other Exercises Other Exercises: Supine AAROM ankle pumps, quad sets, glute sets, SLRs and hip ABD/ADD. All ther-ex performed 8-10 reps with Min A and VC for technique.   Assessment/Plan    PT Assessment Patient needs continued PT services  PT Problem List Decreased strength;Decreased activity tolerance;Decreased balance;Decreased mobility;Obesity;Pain       PT Treatment Interventions DME instruction;Gait training;Stair training;Functional mobility training;Therapeutic activities;Therapeutic exercise;Balance training;Neuromuscular re-education;Patient/family education    PT Goals (Current goals can be found in the Care Plan section)  Acute Rehab PT Goals Patient Stated Goal: To go to rehab to improve strength and mobility PT Goal Formulation: With patient Time For Goal Achievement: 03/22/18 Potential to Achieve Goals: Fair    Frequency Min 2X/week   Barriers to discharge        Co-evaluation               AM-PAC PT "6 Clicks" Daily Activity  Outcome Measure Difficulty turning over in bed (including adjusting bedclothes, sheets and blankets)?: A Lot Difficulty moving from lying on back to sitting on the side of the bed? : Unable Difficulty sitting down on and standing up from a chair with arms (e.g., wheelchair, bedside commode, etc,.)?: Unable Help needed moving to and from a bed to chair (including a wheelchair)?: Total Help needed walking in hospital room?: Total Help needed climbing 3-5 steps with a railing? : Total 6 Click Score: 7    End of Session Equipment Utilized During Treatment: Oxygen Activity  Tolerance: Patient limited by fatigue Patient left: in bed;with call bell/phone within reach;with bed alarm set Nurse Communication: Mobility status PT Visit Diagnosis: Unsteadiness on feet (R26.81);Muscle weakness (generalized) (M62.81)    Time: 9675-9163 PT Time Calculation (min) (ACUTE ONLY): 31 min   Charges:              Algis Downs, SPT   Algis Downs 03/08/2018, 2:09 PM

## 2018-03-08 NOTE — Progress Notes (Signed)
Patient ID: Vicki Morales, female   DOB: 1941/06/06, 78 y.o.   MRN: [id number]  [employer] PROGRESS NOTE  Vicki Morales DHR:[id number] DOB: August 14, 1940 DOA: 03/05/2018 PCP: Birdie Sons, MD  HPI/Subjective: Patient still feeling weak.  Still with some shortness of breath and cough.  Still not feeling great.  Objective: Vitals:   03/08/18 1315 03/08/18 1345  BP: (!) 159/61   Pulse: 89 85  Resp: (!) 21 (!) 26  Temp:    SpO2: 96% 94%    Filed Weights   03/06/18 0500 03/07/18 0406 03/08/18 0500  Weight: 122.9 kg 125.4 kg 125.1 kg    ROS: Review of Systems  Unable to perform ROS: Acuity of condition  Constitutional: Positive for malaise/fatigue. Negative for chills and fever.  Eyes: Negative for blurred vision.  Respiratory: Positive for cough and shortness of breath.   Cardiovascular: Negative for chest pain.  Gastrointestinal: Negative for abdominal pain, constipation, diarrhea, nausea and vomiting.  Genitourinary: Negative for dysuria.  Musculoskeletal: Negative for joint pain.  Neurological: Negative for dizziness and headaches.   Exam: Physical Exam  HENT:  Nose: No mucosal edema.  Mouth/Throat: No oropharyngeal exudate or posterior oropharyngeal edema.  Eyes: Pupils are equal, round, and reactive to light. Conjunctivae, EOM and lids are normal.  Neck: No JVD present. Carotid bruit is not present. No edema present. No thyroid mass and no thyromegaly present.  Cardiovascular: S1 normal and S2 normal. Tachycardia present. Exam reveals no gallop.  No murmur heard. Pulses:      Dorsalis pedis pulses are 2+ on the right side, and 2+ on the left side.  Respiratory: No respiratory distress. She has decreased breath sounds in the right lower field and the left lower field. She has wheezes in the right lower field and the left lower field. She has no rhonchi. She has no rales.  GI: Soft. Bowel sounds are normal. There is no tenderness.  Musculoskeletal:       Right  ankle: She exhibits swelling.       Left ankle: She exhibits swelling.  Lymphadenopathy:    She has no cervical adenopathy.  Neurological: She is alert. No cranial nerve deficit.  Skin: Skin is warm. No rash noted. Nails show no clubbing.  Psychiatric: She has a normal mood and affect.      Data Reviewed: Basic Metabolic Panel: Recent Labs  Lab 03/05/18 1124 03/06/18 0439 03/08/18 0518  NA 131* 131* 135  K 4.4 4.2 4.7  CL 90* 91* 94*  CO2 32 32 33*  GLUCOSE 135* 105* 121*  BUN 19 25* 40*  CREATININE 0.71 0.88 0.89  CALCIUM 9.4 9.2 8.8*   Liver Function Tests: Recent Labs  Lab 03/05/18 1124  AST 13*  ALT 12  ALKPHOS 42  BILITOT 0.9  PROT 5.7*  ALBUMIN 2.8*   CBC: Recent Labs  Lab 03/05/18 1124 03/06/18 0439 03/08/18 0518  WBC 13.7* 12.5* 4.7  NEUTROABS 12.3*  --   --   HGB 12.0 12.1 12.1  HCT 35.8 36.2 34.8*  MCV 94.5 91.6 92.4  PLT 130* 151 154   Cardiac Enzymes: Recent Labs  Lab 03/05/18 1124  TROPONINI <0.03   BNP (last 3 results) Recent Labs    03/05/18 1124  BNP 213.0*    CBG: Recent Labs  Lab 03/05/18 1646 03/06/18 0744  GLUCAP 121* 85    Recent Results (from the past 240 hour(s))  Culture, blood (routine x 2)     Status: Abnormal (Preliminary  result)   Collection Time: 03/05/18 12:03 PM  Result Value Ref Range Status   Specimen Description BLOOD RIGHT EJ  Final   Special Requests   Final    BOTTLES DRAWN AEROBIC AND ANAEROBIC Blood Culture adequate volume   Culture  Setup Time   Final    GRAM POSITIVE COCCI IN BOTH AEROBIC AND ANAEROBIC BOTTLES CRITICAL RESULT CALLED TO, READ BACK BY AND VERIFIED WITH: DAVID BESANTI AT 0327 03/06/18.PMH    Culture STREPTOCOCCUS PNEUMONIAE (A)  Final   Report Status PENDING  Incomplete   Organism ID, Bacteria STREPTOCOCCUS PNEUMONIAE  Final      Susceptibility   Streptococcus pneumoniae - MIC*    ERYTHROMYCIN >=8 RESISTANT Resistant     LEVOFLOXACIN 0.5 SENSITIVE Sensitive     PENICILLIN  (non-meningitis) <=0.06 SENSITIVE Sensitive     PENICILLIN (oral) <=0.06 SENSITIVE Sensitive     CEFTRIAXONE (non-meningitis) Value in next row Sensitive      <=0.12 SENSITIVEPerformed at [facility] [address], [place], [place] [address]    * STREPTOCOCCUS PNEUMONIAE  Culture, blood (routine x 2)     Status: Abnormal (Preliminary result)   Collection Time: 03/05/18 12:23 PM  Result Value Ref Range Status   Specimen Description   Final    BLOOD RIGHT EJ Performed at [facility], [address], [place], [place] [address]    Special Requests   Final    BOTTLES DRAWN AEROBIC AND ANAEROBIC Blood Culture adequate volume Performed at [facility], [address] [place], [place] [address]    Culture  Setup Time   Final    GRAM POSITIVE COCCI IN BOTH AEROBIC AND ANAEROBIC BOTTLES CRITICAL RESULT CALLED TO, READ BACK BY AND VERIFIED WITH: [place] 03/06/18.PMH Performed at [facility], [place] [place], [place] [address]    Culture (A)  Final    STREPTOCOCCUS PNEUMONIAE SUSCEPTIBILITIES PERFORMED ON PREVIOUS CULTURE WITHIN THE LAST 5 DAYS. Performed at [facility], [place] [address] [place],  [address]    Report Status PENDING  Incomplete  Blood Culture ID Panel (Reflexed)     Status: Abnormal   Collection Time: 03/05/18 12:23 PM  Result Value Ref Range Status   Enterococcus species NOT DETECTED NOT DETECTED Final   Listeria monocytogenes NOT DETECTED NOT DETECTED Final   Staphylococcus species NOT DETECTED NOT DETECTED Final   Staphylococcus aureus NOT DETECTED NOT DETECTED Final   Streptococcus species DETECTED (A) NOT DETECTED Final    Comment: CRITICAL RESULT CALLED TO, READ BACK BY AND VERIFIED WITH: DAVID BESANTI AT 0327 03/06/18.PMH    Streptococcus agalactiae NOT DETECTED NOT DETECTED Final   Streptococcus pneumoniae DETECTED (A) NOT DETECTED Final    Comment: CRITICAL RESULT CALLED TO, READ BACK BY  AND VERIFIED WITH: DAVID BESANTI AT 0327 03/06/18.PMH    Streptococcus pyogenes NOT DETECTED NOT DETECTED Final   Acinetobacter baumannii NOT DETECTED NOT DETECTED Final   Enterobacteriaceae species NOT DETECTED NOT DETECTED Final   Enterobacter cloacae complex NOT DETECTED NOT DETECTED Final   Escherichia coli NOT DETECTED NOT DETECTED Final   Klebsiella oxytoca NOT DETECTED NOT DETECTED Final   Klebsiella pneumoniae NOT DETECTED NOT DETECTED Final   Proteus species NOT DETECTED NOT DETECTED Final   Serratia marcescens NOT DETECTED NOT DETECTED Final   Haemophilus influenzae NOT DETECTED NOT DETECTED Final   Neisseria meningitidis NOT DETECTED NOT DETECTED Final   Pseudomonas aeruginosa NOT DETECTED NOT DETECTED Final  Candida albicans NOT DETECTED NOT DETECTED Final   Candida glabrata NOT DETECTED NOT DETECTED Final   Candida krusei NOT DETECTED NOT DETECTED Final   Candida parapsilosis NOT DETECTED NOT DETECTED Final   Candida tropicalis NOT DETECTED NOT DETECTED Final    Comment: Performed at [facility], [address] [place], [place] [address]  MRSA PCR Screening     Status: None   Collection Time: 03/05/18  4:50 PM  Result Value Ref Range Status   MRSA by PCR NEGATIVE NEGATIVE Final    Comment:        The GeneXpert MRSA Assay (FDA approved for NASAL specimens only), is one component of a comprehensive MRSA colonization surveillance program. It is not intended to diagnose MRSA infection nor to guide or monitor treatment for MRSA infections. Performed at [facility], [address] [place], [place] [address]      Studies: No results found.  Scheduled Meds: . acetylcysteine  2 mL Nebulization TID  . albuterol  2.5 mg Nebulization Q4H  . amiodarone  200 mg Oral BID  . apixaban  5 mg Oral BID  . aspirin EC  81 mg Oral Daily  . budesonide (PULMICORT) nebulizer solution  0.5 mg Nebulization BID  . busPIRone  10 mg Oral TID  . digoxin  0.0625  mg Oral Daily  . diltiazem  120 mg Oral Daily  . divalproex  250 mg Oral Q12H  . docusate sodium  100 mg Oral BID  . gabapentin  100 mg Oral BID WC  . gabapentin  600 mg Oral QHS  . [START ON 03/09/2018] methylPREDNISolone (SOLU-MEDROL) injection  40 mg Intravenous Daily  . risperiDONE  1 mg Oral QHS   Continuous Infusions: . cefTRIAXone (ROCEPHIN)  IV 2 g (03/08/18 1138)    Assessment/Plan:  1. Sepsis with Streptococcus pneumonia.  Continue to Rocephin 2. Acute respiratory failure with hypoxia and hypercapnia.  Patient taken off BiPAP and on nasal cannula.  Patient moving better air today. 3. COPD exacerbation with poor air entry.  Continue daily Solu-Medrol and nebulizer treatments 4. Paroxysmal atrial fibrillation on amiodarone and Eliquis and digoxin and Cardizem 5. Anxiety depression and restless leg syndrome.  Continue psychiatric medication 6. Morbid obesity with a BMI of 47.45 7. Weakness  Code Status:     Code Status Orders  (From admission, onward)         Start     Ordered   03/05/18 1337  Do not attempt resuscitation (DNR)  Continuous    Question Answer Comment  In the event of cardiac or respiratory ARREST Do not call a "code blue"   In the event of cardiac or respiratory ARREST Do not perform Intubation, CPR, defibrillation or ACLS   In the event of cardiac or respiratory ARREST Use medication by any route, position, wound care, and other measures to relive pain and suffering. May use oxygen, suction and manual treatment of airway obstruction as needed for comfort.   Comments nurse may pronounce      03/05/18 1338        Code Status History    Date Active Date Inactive Code Status Order ID Comments User Context   02/19/2018 1709 02/26/2018 1634 DNR [id number]  Loletha Grayer, MD ED   12/03/2017 1258 12/06/2017 1847 DNR [id number]  Gladstone Lighter, MD Inpatient   10/24/2017 1844 10/26/2017 1623 DNR [id number]  Vaughan Basta, MD Inpatient   09/23/2015  0018 09/23/2015 1823 DNR [id number]  Lance Coon, MD Inpatient  Advance Directive Documentation     Most Recent Value  Type of Advance Directive  Out of facility DNR (pink MOST or yellow form)  Pre-existing out of facility DNR order (yellow form or pink MOST form)  -  "MOST" Form in Place?  -     Family Communication: Spoke with daughter at the bedside Disposition Plan: To be determined  Consultants:  Critical care team  Antibiotics:  Rocephin  Time spent: 27 minutes  [place]

## 2018-03-09 LAB — BASIC METABOLIC PANEL
Anion gap: 9 (ref 5–15)
BUN: 41 mg/dL — ABNORMAL HIGH (ref 8–23)
CHLORIDE: 94 mmol/L — AB (ref 98–111)
CO2: 34 mmol/L — AB (ref 22–32)
CREATININE: 0.92 mg/dL (ref 0.44–1.00)
Calcium: 8.8 mg/dL — ABNORMAL LOW (ref 8.9–10.3)
GFR calc Af Amer: >60 mL/min (ref >60)
GFR calc non Af Amer: 58 mL/min — ABNORMAL LOW (ref >60)
GLUCOSE: 121 mg/dL — AB (ref 70–99)
POTASSIUM: 4.4 mmol/L (ref 3.5–5.1)
Sodium: 137 mmol/L (ref 135–145)

## 2018-03-09 LAB — CBC
HEMATOCRIT: 36.5 % (ref 35.0–47.0)
HEMOGLOBIN: 12.4 g/dL (ref 12.0–16.0)
MCH: 31.3 pg (ref 26.0–34.0)
MCHC: 33.9 g/dL (ref 32.0–36.0)
MCV: 92.4 fL (ref 80.0–100.0)
Platelets: 166 10*3/uL (ref 150–440)
RBC: 3.94 MIL/uL (ref 3.80–5.20)
RDW: 15.2 % — ABNORMAL HIGH (ref 11.5–14.5)
WBC: 5.6 10*3/uL (ref 3.6–11.0)

## 2018-03-09 MED ORDER — FLUTICASONE PROPIONATE 50 MCG/ACT NA SUSP
1.0000 | Freq: Two times a day (BID) | NASAL | Status: DC
  Administered 2018-03-09 – 2018-03-20 (×22): 1 via NASAL
  Filled 2018-03-09: qty 16

## 2018-03-09 NOTE — Progress Notes (Signed)
Patient ID: Vicki Morales, female   DOB: [birth year], 77 y.o.   MRN: [id number]  Case discussed with infectious disease and she thinks the staph hominis is a contaminant since they drew the cultures out of an IV.  Dr. Loletha Grayer

## 2018-03-09 NOTE — Progress Notes (Signed)
Patient ID: Vicki Morales, female   DOB: June 16, 1940, 77 y.o.   MRN: [id number]  [facility] PROGRESS NOTE  Vicki Morales:[id number] DOB: Oct 02, 1940 DOA: 03/05/2018 PCP: Vicki Sons, MD  HPI/Subjective: Patient still weakness, cough and shortness of breath.  Breathing better than when she came in.  Appetite good.  Objective: Vitals:   03/09/18 1108 03/09/18 1109  BP:  (!) 122/58  Pulse:  (!) 57  Resp:  20  Temp:  98.7 F (37.1 C)  SpO2: 94% 93%    Filed Weights   03/07/18 0406 03/08/18 0500 03/09/18 0500  Weight: 125.4 kg 125.1 kg 122.3 kg    ROS: Review of Systems  Unable to perform ROS: Acuity of condition  Constitutional: Positive for malaise/fatigue. Negative for chills and fever.  Eyes: Negative for blurred vision.  Respiratory: Positive for cough and shortness of breath.   Cardiovascular: Negative for chest pain.  Gastrointestinal: Negative for abdominal pain, constipation, diarrhea, nausea and vomiting.  Genitourinary: Negative for dysuria.  Musculoskeletal: Negative for joint pain.  Neurological: Negative for dizziness and headaches.   Exam: Physical Exam  HENT:  Nose: No mucosal edema.  Mouth/Throat: No oropharyngeal exudate or posterior oropharyngeal edema.  Eyes: Pupils are equal, round, and reactive to light. Conjunctivae, EOM and lids are normal.  Neck: No JVD present. Carotid bruit is not present. No edema present. No thyroid mass and no thyromegaly present.  Cardiovascular: S1 normal and S2 normal. Tachycardia present. Exam reveals no gallop.  No murmur heard. Pulses:      Dorsalis pedis pulses are 2+ on the right side, and 2+ on the left side.  Respiratory: No respiratory distress. She has decreased breath sounds in the right lower field and the left lower field. She has wheezes in the right lower field and the left lower field. She has no rhonchi. She has no rales.  GI: Soft. Bowel sounds are normal. There is no tenderness.   Musculoskeletal:       Right ankle: She exhibits swelling.       Left ankle: She exhibits swelling.  Lymphadenopathy:    She has no cervical adenopathy.  Neurological: She is alert. No cranial nerve deficit.  Skin: Skin is warm. No rash noted. Nails show no clubbing.  Psychiatric: She has a normal mood and affect.      Data Reviewed: Basic Metabolic Panel: Recent Labs  Lab 03/05/18 1124 03/06/18 0439 03/08/18 0518 03/09/18 0349  NA 131* 131* 135 137  K 4.4 4.2 4.7 4.4  CL 90* 91* 94* 94*  CO2 32 32 33* 34*  GLUCOSE 135* 105* 121* 121*  BUN 19 25* 40* 41*  CREATININE 0.71 0.88 0.89 0.92  CALCIUM 9.4 9.2 8.8* 8.8*   Liver Function Tests: Recent Labs  Lab 03/05/18 1124  AST 13*  ALT 12  ALKPHOS 42  BILITOT 0.9  PROT 5.7*  ALBUMIN 2.8*   CBC: Recent Labs  Lab 03/05/18 1124 03/06/18 0439 03/08/18 0518 03/09/18 0349  WBC 13.7* 12.5* 4.7 5.6  NEUTROABS 12.3*  --   --   --   HGB 12.0 12.1 12.1 12.4  HCT 35.8 36.2 34.8* 36.5  MCV 94.5 91.6 92.4 92.4  PLT 130* 151 154 166   Cardiac Enzymes: Recent Labs  Lab 03/05/18 1124  TROPONINI <0.03   BNP (last 3 results) Recent Labs    03/05/18 1124  BNP 213.0*    CBG: Recent Labs  Lab 03/05/18 1646 03/06/18 0744  GLUCAP 121* 85  Recent Results (from the past 240 hour(s))  Culture, blood (routine x 2)     Status: Abnormal (Preliminary result)   Collection Time: 03/05/18 12:03 PM  Result Value Ref Range Status   Specimen Description   Final    BLOOD RIGHT EJ Performed at [facility], [address], [place], [place] [address]    Special Requests   Final    BOTTLES DRAWN AEROBIC AND ANAEROBIC Blood Culture adequate volume Performed at [facility], [address] [place], [place] [address]    Culture  Setup Time   Final    GRAM POSITIVE COCCI IN BOTH AEROBIC AND ANAEROBIC BOTTLES CRITICAL RESULT CALLED TO, READ BACK BY AND VERIFIED WITH: [place]  03/06/18.PMH Performed at [facility], [address] [place], [place] [address]    Culture (A)  Final    STREPTOCOCCUS PNEUMONIAE STAPHYLOCOCCUS HOMINIS SUSCEPTIBILITIES TO FOLLOW Performed at [facility], [address], [place], [place] [address]    Report Status PENDING  Incomplete   Organism ID, Bacteria STREPTOCOCCUS PNEUMONIAE  Final      Susceptibility   Streptococcus pneumoniae - MIC*    ERYTHROMYCIN >=8 RESISTANT Resistant     LEVOFLOXACIN 0.5 SENSITIVE Sensitive     PENICILLIN (non-meningitis) <=0.06 SENSITIVE Sensitive     PENICILLIN (oral) <=0.06 SENSITIVE Sensitive     CEFTRIAXONE (non-meningitis) <=0.12 SENSITIVE Sensitive     * STREPTOCOCCUS PNEUMONIAE  Culture, blood (routine x 2)     Status: Abnormal (Preliminary result)   Collection Time: 03/05/18 12:23 PM  Result Value Ref Range Status   Specimen Description   Final    BLOOD RIGHT EJ Performed at [facility], [address], [place], [place] [address]    Special Requests   Final    BOTTLES DRAWN AEROBIC AND ANAEROBIC Blood Culture adequate volume Performed at [facility] [place], [address], [place], [place] [address]    Culture  Setup Time   Final    GRAM POSITIVE COCCI IN BOTH AEROBIC AND ANAEROBIC BOTTLES CRITICAL RESULT CALLED TO, READ BACK BY AND VERIFIED WITH: DAVID BESANTI AT 0327 03/06/18.PMH Performed at [facility], [address] [place], [place] [address]    Culture (A)  Final    STREPTOCOCCUS PNEUMONIAE SUSCEPTIBILITIES PERFORMED ON PREVIOUS CULTURE WITHIN THE LAST 5 DAYS. STAPHYLOCOCCUS HOMINIS SUSCEPTIBILITIES TO FOLLOW CRITICAL RESULT CALLED TO, READ BACK BY AND VERIFIED WITH: PHARM D [place] [id number] FCP Performed at [facility], [place] [address], [place], [place] [address]    Report Status PENDING  Incomplete  Blood Culture ID Panel (Reflexed)     Status: Abnormal   Collection Time: 03/05/18 12:23 PM  Result Value Ref Range  Status   Enterococcus species NOT DETECTED NOT DETECTED Final   Listeria monocytogenes NOT DETECTED NOT DETECTED Final   Staphylococcus species NOT DETECTED NOT DETECTED Final   Staphylococcus aureus (BCID) NOT DETECTED NOT DETECTED Final   Streptococcus species DETECTED (A) NOT DETECTED Final    Comment: CRITICAL RESULT CALLED TO, READ BACK BY AND VERIFIED WITH: DAVID BESANTI AT 0327 03/06/18.PMH    Streptococcus agalactiae NOT DETECTED NOT DETECTED Final   Streptococcus pneumoniae DETECTED (A) NOT DETECTED Final    Comment: CRITICAL RESULT CALLED TO, READ BACK BY AND VERIFIED WITH: DAVID BESANTI AT 0327 03/06/18.PMH    Streptococcus pyogenes NOT DETECTED NOT DETECTED Final   Acinetobacter baumannii NOT DETECTED NOT DETECTED Final   Enterobacteriaceae species NOT  DETECTED NOT DETECTED Final   Enterobacter cloacae complex NOT DETECTED NOT DETECTED Final   Escherichia coli NOT DETECTED NOT DETECTED Final   Klebsiella oxytoca NOT DETECTED NOT DETECTED Final   Klebsiella pneumoniae NOT DETECTED NOT DETECTED Final   Proteus species NOT DETECTED NOT DETECTED Final   Serratia marcescens NOT DETECTED NOT DETECTED Final   Haemophilus influenzae NOT DETECTED NOT DETECTED Final   Neisseria meningitidis NOT DETECTED NOT DETECTED Final   Pseudomonas aeruginosa NOT DETECTED NOT DETECTED Final   Candida albicans NOT DETECTED NOT DETECTED Final   Candida glabrata NOT DETECTED NOT DETECTED Final   Candida krusei NOT DETECTED NOT DETECTED Final   Candida parapsilosis NOT DETECTED NOT DETECTED Final   Candida tropicalis NOT DETECTED NOT DETECTED Final    Comment: Performed at [facility], [address] [place], [place] [address]  MRSA PCR Screening     Status: None   Collection Time: 03/05/18  4:50 PM  Result Value Ref Range Status   MRSA by PCR NEGATIVE NEGATIVE Final    Comment:        The GeneXpert MRSA Assay (FDA approved for NASAL specimens only), is one component of  a comprehensive MRSA colonization surveillance program. It is not intended to diagnose MRSA infection nor to guide or monitor treatment for MRSA infections. Performed at [facility], [address] [place], [place] [address]       Scheduled Meds: . acetylcysteine  2 mL Nebulization TID  . albuterol  2.5 mg Nebulization Q4H  . amiodarone  200 mg Oral BID  . apixaban  5 mg Oral BID  . aspirin EC  81 mg Oral Daily  . budesonide (PULMICORT) nebulizer solution  0.5 mg Nebulization BID  . busPIRone  10 mg Oral TID  . digoxin  0.0625 mg Oral Daily  . diltiazem  120 mg Oral Daily  . divalproex  250 mg Oral Q12H  . docusate sodium  100 mg Oral BID  . gabapentin  100 mg Oral BID WC  . gabapentin  600 mg Oral QHS  . methylPREDNISolone (SOLU-MEDROL) injection  40 mg Intravenous Daily  . risperiDONE  1 mg Oral QHS   Continuous Infusions: . cefTRIAXone (ROCEPHIN)  IV 2 g (03/09/18 0919)    Assessment/Plan:  1. Sepsis with Streptococcus pneumonia, Staph Hominus also growing.  Continue to Rocephin.  Strange that staph hominis coming up and blood cultures now also.  Case discussed with infectious disease to see the patient.  Repeat blood cultures today. 2. Acute respiratory failure with hypoxia and hypercapnia.  Patient required BiPAP in presentation.  Now on nasal cannula.  Patient moving better air today. 3. COPD exacerbation with poor air entry.  Continue daily Solu-Medrol and nebulizer treatments 4. Paroxysmal atrial fibrillation on amiodarone and Eliquis and digoxin and Cardizem 5. Anxiety depression and restless leg syndrome.  Continue psychiatric medication 6. Morbid obesity with a BMI of 46.28 7. Weakness.  Appreciate physical therapy consultation  Code Status:     Code Status Orders  (From admission, onward)         Start     Ordered   03/05/18 1337  Do not attempt resuscitation (DNR)  Continuous    Question Answer Comment  In the event of cardiac or  respiratory ARREST Do not call a "code blue"   In the event of cardiac or respiratory ARREST Do not perform Intubation, CPR, defibrillation or ACLS   In the event of cardiac or respiratory ARREST  Use medication by any route, position, wound care, and other measures to relive pain and suffering. May use oxygen, suction and manual treatment of airway obstruction as needed for comfort.   Comments nurse may pronounce      03/05/18 1338        Code Status History    Date Active Date Inactive Code Status Order ID Comments User Context   02/19/2018 1709 02/26/2018 1634 DNR [id number]  Loletha Grayer, MD ED   12/03/2017 1258 12/06/2017 1847 DNR [id number]  Gladstone Lighter, MD Inpatient   10/24/2017 1844 10/26/2017 1623 DNR [id number]  Vaughan Basta, MD Inpatient   09/23/2015 0018 09/23/2015 1823 DNR [id number]  Lance Coon, MD Inpatient    Advance Directive Documentation     Most Recent Value  Type of Advance Directive  Out of facility DNR (pink MOST or yellow form)  Pre-existing out of facility DNR order (yellow form or pink MOST form)  -  "MOST" Form in Place?  -     Family Communication: Spoke with daughter yesterday Disposition Plan: To be determined  Consultants:  Critical care team  Antibiotics:  Rocephin  Time spent: 26 minutes  [place]

## 2018-03-09 NOTE — Progress Notes (Signed)
Patient continues to do well off bipap on 2 liter nasal cannula. Resting well. bipap remains on standby

## 2018-03-09 NOTE — Progress Notes (Signed)
Follow-up on streptococcal pneumonia with bacteremia. Subjective:    Patient ID: Vicki LUCKENBAUGH, female    DOB: September 07, 1940, 77 y.o.   MRN: [id number]  HPI patient states she feels markedly better today. She is participating with PT/OT. Her only complaint is that of nasal congestion which is a chronic issue for her. This makes her taking oxygen a little bit difficult. She does not describe any chest pain. No nausea. No fevers chills or sweats ends antibiotic therapy was started. She voices no other complaint today. Overall states that she feels very close to baseline.  Review of Systems  Constitutional: Negative for chills, diaphoresis and fever.  HENT: Positive for congestion.   All other systems reviewed and are negative.      Objective:   Physical Exam  Constitutional: She is oriented to person, place, and time. She appears well-developed. No distress.  Obese woman, nasal speech.  HENT:  Head: Normocephalic and atraumatic.  Mouth/Throat: No oropharyngeal exudate.  Nasal passages boggy.  Eyes: Pupils are equal, round, and reactive to light. Conjunctivae and EOM are normal. No scleral icterus.  Neck: Neck supple. No JVD present. No tracheal deviation present. No thyromegaly present.  Cardiovascular: Normal rate, regular rhythm and normal heart sounds. Exam reveals no gallop and no friction rub.  No murmur heard. Pulmonary/Chest: No stridor. No respiratory distress. She has no wheezes. She has no rales. She exhibits no tenderness.  Chronic use of accessories of respiration. Coarse breath sounds  Abdominal: Soft. Bowel sounds are normal. She exhibits no distension.  Musculoskeletal: She exhibits edema (Trace/1+ lower extremities pitting). She exhibits no deformity.  Lymphadenopathy:    She has no cervical adenopathy.  Neurological: She is alert and oriented to person, place, and time.  Skin: Skin is warm and dry.  Psychiatric: She has a normal mood and affect. Thought content normal.      Laboratory data has been reviewed.     Assessment & Plan:   77 yo F with acute resp failure requiring BIPAP now off bipap from acute R lung pneumonia with strep pneumonia bactermia  Severe Hypoxic and Hypercapnic Respiratory Failure-slowly improving -she is weaning off nocturnal use of BiPAP. Continue supplemental oxygen as she is chronically on this. Maintain oxygen saturations at 90% or better. DNR/DNI Oxygen as needed  ID-strep pneumonia bactermia R sided pneumonia -IV abx for 10-14 days  Chronic rhinitis: trial of Flonase one inhalation twice a day to each nostril.   The patient follows usually with Dr. Wallene Huh, [place] will sign off please re consult as needed.

## 2018-03-09 NOTE — Progress Notes (Addendum)
Report called to [facility] on 1 C. 1050 transferred to 108 via bed.

## 2018-03-10 LAB — CULTURE, BLOOD (ROUTINE X 2)
SPECIAL REQUESTS: ADEQUATE
Special Requests: ADEQUATE

## 2018-03-10 LAB — GLUCOSE, CAPILLARY: GLUCOSE-CAPILLARY: 88 mg/dL (ref 70–99)

## 2018-03-10 MED ORDER — SODIUM CHLORIDE 0.9 % IV BOLUS
500.0000 mL | Freq: Once | INTRAVENOUS | Status: AC
  Administered 2018-03-10: 18:00:00 500 mL via INTRAVENOUS

## 2018-03-10 MED ORDER — ALBUTEROL SULFATE (2.5 MG/3ML) 0.083% IN NEBU
2.5000 mg | INHALATION_SOLUTION | Freq: Three times a day (TID) | RESPIRATORY_TRACT | Status: DC
  Administered 2018-03-10 – 2018-03-15 (×14): 2.5 mg via RESPIRATORY_TRACT
  Filled 2018-03-10 (×16): qty 3

## 2018-03-10 MED ORDER — MONTELUKAST SODIUM 10 MG PO TABS
10.0000 mg | ORAL_TABLET | Freq: Every day | ORAL | Status: DC
  Administered 2018-03-10 – 2018-03-19 (×10): 10 mg via ORAL
  Filled 2018-03-10 (×10): qty 1

## 2018-03-10 MED ORDER — THEOPHYLLINE ER 400 MG PO TB24
200.0000 mg | ORAL_TABLET | Freq: Every day | ORAL | Status: DC
  Administered 2018-03-10 – 2018-03-20 (×11): 200 mg via ORAL
  Filled 2018-03-10 (×11): qty 0.5

## 2018-03-10 NOTE — Clinical Social Work Note (Signed)
CSW spoke to [facility] admissions worker at [facility], and she said they are still waiting for [facility] authorization for patient to go to SNF.  CSW continuing to follow patient's progress throughout discharge planning.  Jones Broom. [place], MSW, [place]  03/10/2018 3:11 PM

## 2018-03-10 NOTE — Progress Notes (Signed)
Pt. Doing well off BIPAP. No distress or shortness of breath noted. BIPAP remains on standby. Will continue to monitor.

## 2018-03-10 NOTE — Care Management Important Message (Signed)
Important Message  Patient Details  Name: Vicki Morales MRN: [id number] Date of Birth: [birth year]   Medicare Important Message Given:  Yes    [name] A Dynastie Knoop 03/10/2018, 12:24 PM

## 2018-03-10 NOTE — Progress Notes (Signed)
Patient ID: Vicki Morales, female   DOB: [birth year], 77 y.o.   MRN: [id number]   [facility] PROGRESS NOTE  FLOREEN TEEGARDEN BJS:[id number] DOB: 1941/04/19 DOA: 03/05/2018 PCP: Birdie Sons, MD  HPI/Subjective: Patient not feeling well.  She was up all night with diarrhea.  Still with shortness of breath and cough.  Still feeling very weak.  Objective: Vitals:   03/10/18 0936 03/10/18 0937  BP:  (!) 131/53  Pulse: 69 69  Resp:    Temp:  97.7 F (36.5 C)  SpO2:      Filed Weights   03/08/18 0500 03/09/18 0500 03/10/18 0502  Weight: 125.1 kg 122.3 kg 122.7 kg    ROS: Review of Systems  Unable to perform ROS: Acuity of condition  Constitutional: Positive for malaise/fatigue. Negative for chills and fever.  Eyes: Negative for blurred vision.  Respiratory: Positive for cough and shortness of breath.   Cardiovascular: Negative for chest pain.  Gastrointestinal: Positive for diarrhea. Negative for abdominal pain, constipation, nausea and vomiting.  Genitourinary: Negative for dysuria.  Musculoskeletal: Negative for joint pain.  Neurological: Negative for dizziness and headaches.   Exam: Physical Exam  HENT:  Nose: No mucosal edema.  Mouth/Throat: No oropharyngeal exudate or posterior oropharyngeal edema.  Eyes: Pupils are equal, round, and reactive to light. Conjunctivae, EOM and lids are normal.  Neck: No JVD present. Carotid bruit is not present. No edema present. No thyroid mass and no thyromegaly present.  Cardiovascular: S1 normal and S2 normal. Exam reveals no gallop.  No murmur heard. Pulses:      Dorsalis pedis pulses are 2+ on the right side, and 2+ on the left side.  Respiratory: No respiratory distress. She has decreased breath sounds in the right lower field and the left lower field. She has wheezes in the right lower field and the left lower field. She has no rhonchi. She has no rales.  GI: Soft. Bowel sounds are normal. There is no tenderness.   Musculoskeletal:       Right ankle: She exhibits swelling.       Left ankle: She exhibits swelling.  Lymphadenopathy:    She has no cervical adenopathy.  Neurological: She is alert. No cranial nerve deficit.  Skin: Skin is warm. No rash noted. Nails show no clubbing.  Psychiatric: She has a normal mood and affect.      Data Reviewed: Basic Metabolic Panel: Recent Labs  Lab 03/05/18 1124 03/06/18 0439 03/08/18 0518 03/09/18 0349  NA 131* 131* 135 137  K 4.4 4.2 4.7 4.4  CL 90* 91* 94* 94*  CO2 32 32 33* 34*  GLUCOSE 135* 105* 121* 121*  BUN 19 25* 40* 41*  CREATININE 0.71 0.88 0.89 0.92  CALCIUM 9.4 9.2 8.8* 8.8*   Liver Function Tests: Recent Labs  Lab 03/05/18 1124  AST 13*  ALT 12  ALKPHOS 42  BILITOT 0.9  PROT 5.7*  ALBUMIN 2.8*   CBC: Recent Labs  Lab 03/05/18 1124 03/06/18 0439 03/08/18 0518 03/09/18 0349  WBC 13.7* 12.5* 4.7 5.6  NEUTROABS 12.3*  --   --   --   HGB 12.0 12.1 12.1 12.4  HCT 35.8 36.2 34.8* 36.5  MCV 94.5 91.6 92.4 92.4  PLT 130* 151 154 166   Cardiac Enzymes: Recent Labs  Lab 03/05/18 1124  TROPONINI <0.03   BNP (last 3 results) Recent Labs    03/05/18 1124  BNP 213.0*    CBG: Recent Labs  Lab 03/05/18 1646  03/06/18 0744 03/10/18 0806  GLUCAP 121* 85 88    Recent Results (from the past 240 hour(s))  Culture, blood (routine x 2)     Status: Abnormal   Collection Time: 03/05/18 12:03 PM  Result Value Ref Range Status   Specimen Description   Final    BLOOD RIGHT EJ Performed at [facility], [address], [place], [place] [address]    Special Requests   Final    BOTTLES DRAWN AEROBIC AND ANAEROBIC Blood Culture adequate volume Performed at [facility], [address], [place], [place] [address]    Culture  Setup Time   Final    GRAM POSITIVE COCCI IN BOTH AEROBIC AND ANAEROBIC BOTTLES CRITICAL RESULT CALLED TO, READ BACK BY AND VERIFIED WITH: [place]  03/06/18.PMH Performed at [facility], [address] [place], [place] [address]    Culture (A)  Final    STREPTOCOCCUS PNEUMONIAE STAPHYLOCOCCUS HOMINIS SUSCEPTIBILITIES PERFORMED ON PREVIOUS CULTURE WITHIN THE LAST 5 DAYS. Performed at [facility], [place] [address] [place], [place] [address]    Report Status 03/10/2018 FINAL  Final   Organism ID, Bacteria STREPTOCOCCUS PNEUMONIAE  Final      Susceptibility   Streptococcus pneumoniae - MIC*    ERYTHROMYCIN >=8 RESISTANT Resistant     LEVOFLOXACIN 0.5 SENSITIVE Sensitive     PENICILLIN (non-meningitis) <=0.06 SENSITIVE Sensitive     PENICILLIN (oral) <=0.06 SENSITIVE Sensitive     CEFTRIAXONE (non-meningitis) <=0.12 SENSITIVE Sensitive     * STREPTOCOCCUS PNEUMONIAE  Culture, blood (routine x 2)     Status: Abnormal   Collection Time: 03/05/18 12:23 PM  Result Value Ref Range Status   Specimen Description   Final    BLOOD RIGHT EJ Performed at [facility], [address] [place], [place] [address]    Special Requests   Final    BOTTLES DRAWN AEROBIC AND ANAEROBIC Blood Culture adequate volume Performed at [facility], [address] [place], [place] [address]    Culture  Setup Time   Final    GRAM POSITIVE COCCI IN BOTH AEROBIC AND ANAEROBIC BOTTLES CRITICAL RESULT CALLED TO, READ BACK BY AND VERIFIED WITH: [place] 03/06/18.PMH Performed at [facility], [address] [place], [place] [address]    Culture (A)  Final    STREPTOCOCCUS PNEUMONIAE SUSCEPTIBILITIES PERFORMED ON PREVIOUS CULTURE WITHIN THE LAST 5 DAYS. STAPHYLOCOCCUS HOMINIS CRITICAL RESULT CALLED TO, READ BACK BY AND VERIFIED WITH: PHARM D [facility] [id number] FCP Performed at [facility], [place] [address] [place], [place] [address]    Report Status 03/10/2018 FINAL  Final   Organism ID, Bacteria STAPHYLOCOCCUS HOMINIS  Final      Susceptibility   Staphylococcus hominis - MIC*     CIPROFLOXACIN >=8 RESISTANT Resistant     ERYTHROMYCIN >=8 RESISTANT Resistant     GENTAMICIN 1 SENSITIVE Sensitive     OXACILLIN 1 RESISTANT Resistant     TETRACYCLINE >=16 RESISTANT Resistant     VANCOMYCIN 1 SENSITIVE Sensitive     TRIMETH/SULFA 40 SENSITIVE Sensitive     CLINDAMYCIN >=8 RESISTANT Resistant     RIFAMPIN <=0.5 SENSITIVE Sensitive     Inducible Clindamycin NEGATIVE Sensitive     * STAPHYLOCOCCUS HOMINIS  Blood Culture ID Panel (Reflexed)     Status: Abnormal   Collection Time: 03/05/18 12:23 PM  Result Value Ref Range Status   Enterococcus species NOT DETECTED  NOT DETECTED Final   Listeria monocytogenes NOT DETECTED NOT DETECTED Final   Staphylococcus species NOT DETECTED NOT DETECTED Final   Staphylococcus aureus (BCID) NOT DETECTED NOT DETECTED Final   Streptococcus species DETECTED (A) NOT DETECTED Final    Comment: CRITICAL RESULT CALLED TO, READ BACK BY AND VERIFIED WITH: DAVID BESANTI AT 0327 03/06/18.PMH    Streptococcus agalactiae NOT DETECTED NOT DETECTED Final   Streptococcus pneumoniae DETECTED (A) NOT DETECTED Final    Comment: CRITICAL RESULT CALLED TO, READ BACK BY AND VERIFIED WITH: DAVID BESANTI AT 0327 03/06/18.PMH    Streptococcus pyogenes NOT DETECTED NOT DETECTED Final   Acinetobacter baumannii NOT DETECTED NOT DETECTED Final   Enterobacteriaceae species NOT DETECTED NOT DETECTED Final   Enterobacter cloacae complex NOT DETECTED NOT DETECTED Final   Escherichia coli NOT DETECTED NOT DETECTED Final   Klebsiella oxytoca NOT DETECTED NOT DETECTED Final   Klebsiella pneumoniae NOT DETECTED NOT DETECTED Final   Proteus species NOT DETECTED NOT DETECTED Final   Serratia marcescens NOT DETECTED NOT DETECTED Final   Haemophilus influenzae NOT DETECTED NOT DETECTED Final   Neisseria meningitidis NOT DETECTED NOT DETECTED Final   Pseudomonas aeruginosa NOT DETECTED NOT DETECTED Final   Candida albicans NOT DETECTED NOT DETECTED Final   Candida  glabrata NOT DETECTED NOT DETECTED Final   Candida krusei NOT DETECTED NOT DETECTED Final   Candida parapsilosis NOT DETECTED NOT DETECTED Final   Candida tropicalis NOT DETECTED NOT DETECTED Final    Comment: Performed at [facility], [address] [place], [place] [address]  MRSA PCR Screening     Status: None   Collection Time: 03/05/18  4:50 PM  Result Value Ref Range Status   MRSA by PCR NEGATIVE NEGATIVE Final    Comment:        The GeneXpert MRSA Assay (FDA approved for NASAL specimens only), is one component of a comprehensive MRSA colonization surveillance program. It is not intended to diagnose MRSA infection nor to guide or monitor treatment for MRSA infections. Performed at [facility], [address] [place], [place] [address]   CULTURE, BLOOD (ROUTINE X 2) w Reflex to ID Panel     Status: None (Preliminary result)   Collection Time: 03/09/18  2:23 PM  Result Value Ref Range Status   Specimen Description BLOOD BLOOD LEFT HAND  Final   Special Requests   Final    BOTTLES DRAWN AEROBIC AND ANAEROBIC Blood Culture adequate volume   Culture   Final    NO GROWTH < 24 HOURS Performed at [facility], [address], [place], [place] [address]    Report Status PENDING  Incomplete  CULTURE, BLOOD (ROUTINE X 2) w Reflex to ID Panel     Status: None (Preliminary result)   Collection Time: 03/09/18  2:34 PM  Result Value Ref Range Status   Specimen Description BLOOD BLOOD RIGHT HAND  Final   Special Requests   Final    BOTTLES DRAWN AEROBIC AND ANAEROBIC Blood Culture adequate volume   Culture   Final    NO GROWTH < 24 HOURS Performed at [facility], [address] [place], [place] [address]    Report Status PENDING  Incomplete      Scheduled Meds: . acetylcysteine  2 mL Nebulization TID  . albuterol  2.5 mg Nebulization TID  . amiodarone  200 mg Oral BID  . apixaban  5 mg Oral BID  . aspirin EC  81 mg Oral  Daily  .  budesonide (PULMICORT) nebulizer solution  0.5 mg Nebulization BID  . busPIRone  10 mg Oral TID  . digoxin  0.0625 mg Oral Daily  . diltiazem  120 mg Oral Daily  . divalproex  250 mg Oral Q12H  . fluticasone  1 spray Each Nare BID  . gabapentin  100 mg Oral BID WC  . gabapentin  600 mg Oral QHS  . methylPREDNISolone (SOLU-MEDROL) injection  40 mg Intravenous Daily  . risperiDONE  1 mg Oral QHS   Continuous Infusions: . cefTRIAXone (ROCEPHIN)  IV 2 g (03/10/18 0945)    Assessment/Plan:  1. Sepsis with Streptococcus pneumonia, Staph Hominus is likely a contamination..  Continue to Rocephin.  Repeat blood cultures so far negative. 2. Acute respiratory failure with hypoxia and hypercapnia.  Patient required BiPAP in presentation.  Now on nasal cannula.  Patient moving better air today. 3. COPD exacerbation with poor air entry.  Continue daily Solu-Medrol and nebulizer treatments. 4. Paroxysmal atrial fibrillation on amiodarone and Eliquis and digoxin and Cardizem 5. Anxiety depression and restless leg syndrome.  Continue psychiatric medication 6. Morbid obesity with a BMI of 46.43 7. Weakness.  Appreciate physical therapy consultation.  Patient interested in rehab. 8. Diarrhea.  Stop stool softeners. 9. Increase in BUN.  I will give a fluid bolus.  Code Status:     Code Status Orders  (From admission, onward)         Start     Ordered   03/05/18 1337  Do not attempt resuscitation (DNR)  Continuous    Question Answer Comment  In the event of cardiac or respiratory ARREST Do not call a "code blue"   In the event of cardiac or respiratory ARREST Do not perform Intubation, CPR, defibrillation or ACLS   In the event of cardiac or respiratory ARREST Use medication by any route, position, wound care, and other measures to relive pain and suffering. May use oxygen, suction and manual treatment of airway obstruction as needed for comfort.   Comments nurse may pronounce      03/05/18  1338        Code Status History    Date Active Date Inactive Code Status Order ID Comments User Context   02/19/2018 1709 02/26/2018 1634 DNR [id number]  Loletha Grayer, MD ED   12/03/2017 1258 12/06/2017 1847 DNR [id number]  Gladstone Lighter, MD Inpatient   10/24/2017 1844 10/26/2017 1623 DNR [id number]  Vaughan Basta, MD Inpatient   09/23/2015 0018 09/23/2015 1823 DNR [id number]  Lance Coon, MD Inpatient    Advance Directive Documentation     Most Recent Value  Type of Advance Directive  Out of facility DNR (pink MOST or yellow form)  Pre-existing out of facility DNR order (yellow form or pink MOST form)  -  "MOST" Form in Place?  -     Family Communication: Tried to call the daughter but unable to leave a message on her cell phone. Disposition Plan: Hopefully over the weekend can go to [place] care  Consultants:  Critical care team  Antibiotics:  Rocephin while here probably flipped over the [place] upon getting out of the hospital.  Time spent: 27 minutes  [place]

## 2018-03-10 NOTE — Progress Notes (Signed)
Patient stated she did not want to use Bipap but would call if she felt short of breath or needed it. Bipap in room on SB

## 2018-03-10 NOTE — Progress Notes (Signed)
Physical Therapy Treatment Patient Details Name: Vicki Morales MRN: [id number] DOB: [birth year] Today's Date: 03/10/2018    History of Present Illness Pt presented to ER on 03/05/2018 with c/c of altered mental status and was admitted for acute and chronic respiratory failure with hypercapnia. Pt is positive for strep pnuemonia bactermia, R sided pnuemonia. Pt was off and on BiPAP on 9/29-10/1 now on 4L via nasal cannula. PMH includes COPD, chronic A-fib, GERD, HTN, orthopnea, anxiety and depression, chronic knee pain, and RLS.    PT Comments    Pt very pleasant on arrival and agreeable to PT. Pt demonstrates improved bed mobility she independently rolls onto her side, requires Min A to maintain position on side. To come to sit EOB pt requires Min A to control trunk, manages LEs independently and to transfer from sit-to-supine Min A to manage LEs. She demonstrates ability to independently laterally scoot at edge of bed, and scoot up in bed bridging with bed in trendelenburg.  No longer a +2 with bed mobility. She has improved endurance tolerating sitting at EOB for more than 5 minutes including a few ther-ex. T/o treatment O2 monitored, baseline 95%, with activity 82-91%, s/p PT 94%. With short rest (<1 min) pt able to get O2 stats back into the low 90s. Will continue to progress strength and mobility as able.   Follow Up Recommendations  SNF     Equipment Recommendations  None recommended by PT    Recommendations for Other Services       Precautions / Restrictions Precautions Precautions: Fall Restrictions Weight Bearing Restrictions: No    Mobility  Bed Mobility Overal bed mobility: Needs Assistance Bed Mobility: Supine to Sit;Sit to Supine     Supine to sit: Min assist Sit to supine: Min assist   General bed mobility comments: Pt demonstrates ability to disassociate trunk, and use UE to pull on handle. She has improved mobility in the LE requiring less physical assist to  manage. Pt also demonstrated ability perform lateral scooting with UE support using RW independently. Also demonstrates ability to scoot up in bed using UE on handles and bridging with bed in trendelenburg independently.  Transfers                 General transfer comment: Unable to assess d/t fatigue.  Ambulation/Gait                 Stairs             Wheelchair Mobility    Modified Rankin (Stroke Patients Only)       Balance Overall balance assessment: Modified Independent Sitting-balance support: Bilateral upper extremity supported;Feet supported Sitting balance-Leahy Scale: Fair Sitting balance - Comments: Pt able to control trunk in upright posture, mild dizziness reported which resolved in < 1 min, uses BUE on bed for support. Pt maintained position >5 min.                                    Cognition Arousal/Alertness: Awake/alert Behavior During Therapy: WFL for tasks assessed/performed Overall Cognitive Status: No family/caregiver present to determine baseline cognitive functioning                                 General Comments: Pt is AOx4, short term memory impaired unable recall events of the last few days.  Exercises Other Exercises Other Exercises: Seated at EOB BLE LAQs and marching, ankle DF, ankle PF. All ther-ex performed 5-8 reps with VC for technique and encouragement.    General Comments        Pertinent Vitals/Pain Pain Assessment: No/denies pain    Home Living                      Prior Function            PT Goals (current goals can now be found in the care plan section) Acute Rehab PT Goals Patient Stated Goal: To go to rehab to improve strength and mobility PT Goal Formulation: With patient Time For Goal Achievement: 03/22/18 Potential to Achieve Goals: Fair Progress towards PT goals: Progressing toward goals    Frequency    Min 2X/week      PT Plan Current  plan remains appropriate    Co-evaluation              AM-PAC PT "6 Clicks" Daily Activity  Outcome Measure  Difficulty turning over in bed (including adjusting bedclothes, sheets and blankets)?: A Little Difficulty moving from lying on back to sitting on the side of the bed? : Unable Difficulty sitting down on and standing up from a chair with arms (e.g., wheelchair, bedside commode, etc,.)?: Unable Help needed moving to and from a bed to chair (including a wheelchair)?: Total Help needed walking in hospital room?: Total Help needed climbing 3-5 steps with a railing? : Total 6 Click Score: 8    End of Session Equipment Utilized During Treatment: Oxygen;Gait belt Activity Tolerance: Patient limited by fatigue Patient left: in bed;with call bell/phone within reach;with bed alarm set Nurse Communication: Mobility status PT Visit Diagnosis: Unsteadiness on feet (R26.81);Muscle weakness (generalized) (M62.81)     Time: 3818-2993 PT Time Calculation (min) (ACUTE ONLY): 36 min  Charges:                        Algis Downs, SPT   Algis Downs 03/10/2018, 3:16 PM

## 2018-03-11 ENCOUNTER — Inpatient Hospital Stay: Payer: Medicare HMO

## 2018-03-11 LAB — BASIC METABOLIC PANEL
ANION GAP: 7 (ref 5–15)
BUN: 26 mg/dL — ABNORMAL HIGH (ref 8–23)
CALCIUM: 8.8 mg/dL — AB (ref 8.9–10.3)
CO2: 37 mmol/L — AB (ref 22–32)
Chloride: 95 mmol/L — ABNORMAL LOW (ref 98–111)
Creatinine, Ser: 0.69 mg/dL (ref 0.44–1.00)
GLUCOSE: 110 mg/dL — AB (ref 70–99)
POTASSIUM: 5.5 mmol/L — AB (ref 3.5–5.1)
Sodium: 139 mmol/L (ref 135–145)

## 2018-03-11 LAB — CBC
HCT: 39.1 % (ref 35.0–47.0)
HEMOGLOBIN: 13.1 g/dL (ref 12.0–16.0)
MCH: 30.6 pg (ref 26.0–34.0)
MCHC: 33.4 g/dL (ref 32.0–36.0)
MCV: 91.5 fL (ref 80.0–100.0)
Platelets: 171 10*3/uL (ref 150–440)
RBC: 4.27 MIL/uL (ref 3.80–5.20)
RDW: 14.6 % — AB (ref 11.5–14.5)
WBC: 11.9 10*3/uL — ABNORMAL HIGH (ref 3.6–11.0)

## 2018-03-11 LAB — GLUCOSE, CAPILLARY: Glucose-Capillary: 91 mg/dL (ref 70–99)

## 2018-03-11 LAB — OCCULT BLOOD X 1 CARD TO LAB, STOOL: Fecal Occult Bld: NEGATIVE

## 2018-03-11 MED ORDER — TRAZODONE HCL 50 MG PO TABS
50.0000 mg | ORAL_TABLET | Freq: Every evening | ORAL | Status: DC | PRN
  Administered 2018-03-11 – 2018-03-14 (×2): 50 mg via ORAL
  Filled 2018-03-11 (×2): qty 1

## 2018-03-11 MED ORDER — LOPERAMIDE HCL 2 MG PO CAPS
2.0000 mg | ORAL_CAPSULE | Freq: Four times a day (QID) | ORAL | Status: DC | PRN
  Administered 2018-03-11: 03:00:00 2 mg via ORAL
  Filled 2018-03-11: qty 1

## 2018-03-11 MED ORDER — ZOLPIDEM TARTRATE 5 MG PO TABS: 5.0000 mg | ORAL_TABLET | Freq: Every day | ORAL | Status: DC

## 2018-03-11 MED ORDER — LOPERAMIDE HCL 2 MG PO CAPS
4.0000 mg | ORAL_CAPSULE | Freq: Four times a day (QID) | ORAL | Status: DC
  Administered 2018-03-11 – 2018-03-12 (×4): 4 mg via ORAL
  Filled 2018-03-11 (×4): qty 2

## 2018-03-11 NOTE — Progress Notes (Signed)
Patient ID: Vicki Morales, female   DOB: 02-27-1941, 77 y.o.   MRN: [id number]   [facility] PROGRESS NOTE  Vicki Morales BJS:[id number] DOB: 1941/04/25 DOA: 03/05/2018 PCP: Birdie Sons, MD  HPI/Subjective: Patient states that she did not sleep good and is not feeling well.  Continues to have diarrhea.   Objective: Vitals:   03/10/18 2056 03/11/18 0336  BP:  (!) 166/66  Pulse:  78  Resp:  19  Temp:  (!) 97.5 F (36.4 C)  SpO2: 94% 97%    Filed Weights   03/09/18 0500 03/10/18 0502 03/11/18 0336  Weight: 122.3 kg 122.7 kg 122.2 kg    ROS: Review of Systems  Unable to perform ROS: Acuity of condition  Constitutional: Positive for malaise/fatigue. Negative for chills and fever.  Eyes: Negative for blurred vision.  Respiratory: Positive for cough and shortness of breath.   Cardiovascular: Negative for chest pain.  Gastrointestinal: Positive for diarrhea. Negative for abdominal pain, constipation, nausea and vomiting.  Genitourinary: Negative for dysuria.  Musculoskeletal: Negative for joint pain.  Neurological: Negative for dizziness and headaches.   Exam: Physical Exam  HENT:  Nose: No mucosal edema.  Mouth/Throat: No oropharyngeal exudate or posterior oropharyngeal edema.  Eyes: Pupils are equal, round, and reactive to light. Conjunctivae, EOM and lids are normal.  Neck: No JVD present. Carotid bruit is not present. No edema present. No thyroid mass and no thyromegaly present.  Cardiovascular: S1 normal and S2 normal. Exam reveals no gallop.  No murmur heard. Pulses:      Dorsalis pedis pulses are 2+ on the right side, and 2+ on the left side.  Respiratory: No respiratory distress. She has decreased breath sounds in the right lower field and the left lower field. She has wheezes in the right lower field and the left lower field. She has no rhonchi. She has no rales.  GI: Soft. Bowel sounds are normal. There is no tenderness.  Musculoskeletal:       Right  ankle: She exhibits swelling.       Left ankle: She exhibits swelling.  Lymphadenopathy:    She has no cervical adenopathy.  Neurological: She is alert. No cranial nerve deficit.  Skin: Skin is warm. No rash noted. Nails show no clubbing.  Psychiatric: She has a normal mood and affect.      Data Reviewed: Basic Metabolic Panel: Recent Labs  Lab 03/05/18 1124 03/06/18 0439 03/08/18 0518 03/09/18 0349 03/11/18 0635  NA 131* 131* 135 137 139  K 4.4 4.2 4.7 4.4 5.5*  CL 90* 91* 94* 94* 95*  CO2 32 32 33* 34* 37*  GLUCOSE 135* 105* 121* 121* 110*  BUN 19 25* 40* 41* 26*  CREATININE 0.71 0.88 0.89 0.92 0.69  CALCIUM 9.4 9.2 8.8* 8.8* 8.8*   Liver Function Tests: Recent Labs  Lab 03/05/18 1124  AST 13*  ALT 12  ALKPHOS 42  BILITOT 0.9  PROT 5.7*  ALBUMIN 2.8*   CBC: Recent Labs  Lab 03/05/18 1124 03/06/18 0439 03/08/18 0518 03/09/18 0349 03/11/18 0453  WBC 13.7* 12.5* 4.7 5.6 11.9*  NEUTROABS 12.3*  --   --   --   --   HGB 12.0 12.1 12.1 12.4 13.1  HCT 35.8 36.2 34.8* 36.5 39.1  MCV 94.5 91.6 92.4 92.4 91.5  PLT 130* 151 154 166 171   Cardiac Enzymes: Recent Labs  Lab 03/05/18 1124  TROPONINI <0.03   BNP (last 3 results) Recent Labs  03/05/18 1124  BNP 213.0*    CBG: Recent Labs  Lab 03/05/18 1646 03/06/18 0744 03/10/18 0806 03/11/18 0741  GLUCAP 121* 85 88 91    Recent Results (from the past 240 hour(s))  Culture, blood (routine x 2)     Status: Abnormal   Collection Time: 03/05/18 12:03 PM  Result Value Ref Range Status   Specimen Description   Final    BLOOD RIGHT EJ Performed at [facility], [address], [place], [place] [address]    Special Requests   Final    BOTTLES DRAWN AEROBIC AND ANAEROBIC Blood Culture adequate volume Performed at [facility], [address], [place], [place] [address]    Culture  Setup Time   Final    GRAM POSITIVE COCCI IN BOTH AEROBIC AND ANAEROBIC BOTTLES CRITICAL RESULT  CALLED TO, READ BACK BY AND VERIFIED WITH: DAVID BESANTI AT 0327 03/06/18.PMH Performed at [facility], [address] [place], [place] [address]    Culture (A)  Final    STREPTOCOCCUS PNEUMONIAE STAPHYLOCOCCUS HOMINIS SUSCEPTIBILITIES PERFORMED ON PREVIOUS CULTURE WITHIN THE LAST 5 DAYS. Performed at [facility], [place] [address], [place], [place] [address]    Report Status 03/10/2018 FINAL  Final   Organism ID, Bacteria STREPTOCOCCUS PNEUMONIAE  Final      Susceptibility   Streptococcus pneumoniae - MIC*    ERYTHROMYCIN >=8 RESISTANT Resistant     LEVOFLOXACIN 0.5 SENSITIVE Sensitive     PENICILLIN (non-meningitis) <=0.06 SENSITIVE Sensitive     PENICILLIN (oral) <=0.06 SENSITIVE Sensitive     CEFTRIAXONE (non-meningitis) <=0.12 SENSITIVE Sensitive     * STREPTOCOCCUS PNEUMONIAE  Culture, blood (routine x 2)     Status: Abnormal   Collection Time: 03/05/18 12:23 PM  Result Value Ref Range Status   Specimen Description   Final    BLOOD RIGHT EJ Performed at [facility], [address] [place], [place] [address]    Special Requests   Final    BOTTLES DRAWN AEROBIC AND ANAEROBIC Blood Culture adequate volume Performed at [facility], [address] [place], [place] [address]    Culture  Setup Time   Final    GRAM POSITIVE COCCI IN BOTH AEROBIC AND ANAEROBIC BOTTLES CRITICAL RESULT CALLED TO, READ BACK BY AND VERIFIED WITH: [place] 03/06/18.PMH Performed at [facility], [address] [place], [place] [address]    Culture (A)  Final    STREPTOCOCCUS PNEUMONIAE SUSCEPTIBILITIES PERFORMED ON PREVIOUS CULTURE WITHIN THE LAST 5 DAYS. STAPHYLOCOCCUS HOMINIS CRITICAL RESULT CALLED TO, READ BACK BY AND VERIFIED WITH: PHARM D [place] [id number] FCP Performed at [facility], [place] [address] [place],  [address]    Report Status 03/10/2018 FINAL  Final   Organism ID, Bacteria STAPHYLOCOCCUS HOMINIS   Final      Susceptibility   Staphylococcus hominis - MIC*    CIPROFLOXACIN >=8 RESISTANT Resistant     ERYTHROMYCIN >=8 RESISTANT Resistant     GENTAMICIN 1 SENSITIVE Sensitive     OXACILLIN 1 RESISTANT Resistant     TETRACYCLINE >=16 RESISTANT Resistant     VANCOMYCIN 1 SENSITIVE Sensitive     TRIMETH/SULFA 40 SENSITIVE Sensitive     CLINDAMYCIN >=8 RESISTANT Resistant     RIFAMPIN <=0.5 SENSITIVE Sensitive     Inducible Clindamycin NEGATIVE Sensitive     * STAPHYLOCOCCUS HOMINIS  Blood Culture ID Panel (Reflexed)     Status: Abnormal  Collection Time: 03/05/18 12:23 PM  Result Value Ref Range Status   Enterococcus species NOT DETECTED NOT DETECTED Final   Listeria monocytogenes NOT DETECTED NOT DETECTED Final   Staphylococcus species NOT DETECTED NOT DETECTED Final   Staphylococcus aureus (BCID) NOT DETECTED NOT DETECTED Final   Streptococcus species DETECTED (A) NOT DETECTED Final    Comment: CRITICAL RESULT CALLED TO, READ BACK BY AND VERIFIED WITH: DAVID BESANTI AT 0327 03/06/18.PMH    Streptococcus agalactiae NOT DETECTED NOT DETECTED Final   Streptococcus pneumoniae DETECTED (A) NOT DETECTED Final    Comment: CRITICAL RESULT CALLED TO, READ BACK BY AND VERIFIED WITH: DAVID BESANTI AT 0327 03/06/18.PMH    Streptococcus pyogenes NOT DETECTED NOT DETECTED Final   Acinetobacter baumannii NOT DETECTED NOT DETECTED Final   Enterobacteriaceae species NOT DETECTED NOT DETECTED Final   Enterobacter cloacae complex NOT DETECTED NOT DETECTED Final   Escherichia coli NOT DETECTED NOT DETECTED Final   Klebsiella oxytoca NOT DETECTED NOT DETECTED Final   Klebsiella pneumoniae NOT DETECTED NOT DETECTED Final   Proteus species NOT DETECTED NOT DETECTED Final   Serratia marcescens NOT DETECTED NOT DETECTED Final   Haemophilus influenzae NOT DETECTED NOT DETECTED Final   Neisseria meningitidis NOT DETECTED NOT DETECTED Final   Pseudomonas aeruginosa NOT DETECTED NOT DETECTED Final    Candida albicans NOT DETECTED NOT DETECTED Final   Candida glabrata NOT DETECTED NOT DETECTED Final   Candida krusei NOT DETECTED NOT DETECTED Final   Candida parapsilosis NOT DETECTED NOT DETECTED Final   Candida tropicalis NOT DETECTED NOT DETECTED Final    Comment: Performed at [facility], [address] [place], [place] [address]  MRSA PCR Screening     Status: None   Collection Time: 03/05/18  4:50 PM  Result Value Ref Range Status   MRSA by PCR NEGATIVE NEGATIVE Final    Comment:        The GeneXpert MRSA Assay (FDA approved for NASAL specimens only), is one component of a comprehensive MRSA colonization surveillance program. It is not intended to diagnose MRSA infection nor to guide or monitor treatment for MRSA infections. Performed at [facility], [address] [place], [place] [address]   CULTURE, BLOOD (ROUTINE X 2) w Reflex to ID Panel     Status: None (Preliminary result)   Collection Time: 03/09/18  2:23 PM  Result Value Ref Range Status   Specimen Description BLOOD BLOOD LEFT HAND  Final   Special Requests   Final    BOTTLES DRAWN AEROBIC AND ANAEROBIC Blood Culture adequate volume   Culture   Final    NO GROWTH 2 DAYS Performed at [facility], [address] [place], [place] [address]    Report Status PENDING  Incomplete  CULTURE, BLOOD (ROUTINE X 2) w Reflex to ID Panel     Status: None (Preliminary result)   Collection Time: 03/09/18  2:34 PM  Result Value Ref Range Status   Specimen Description BLOOD BLOOD RIGHT HAND  Final   Special Requests   Final    BOTTLES DRAWN AEROBIC AND ANAEROBIC Blood Culture adequate volume   Culture   Final    NO GROWTH 2 DAYS Performed at [facility], [address] [place], [place] [address]    Report Status PENDING  Incomplete      Scheduled Meds: . acetylcysteine  2 mL Nebulization TID  . albuterol  2.5 mg Nebulization TID  . amiodarone  200 mg Oral BID  .  apixaban   5 mg Oral BID  . aspirin EC  81 mg Oral Daily  . budesonide (PULMICORT) nebulizer solution  0.5 mg Nebulization BID  . busPIRone  10 mg Oral TID  . digoxin  0.0625 mg Oral Daily  . diltiazem  120 mg Oral Daily  . divalproex  250 mg Oral Q12H  . fluticasone  1 spray Each Nare BID  . gabapentin  100 mg Oral BID WC  . gabapentin  600 mg Oral QHS  . loperamide  4 mg Oral Q6H  . methylPREDNISolone (SOLU-MEDROL) injection  40 mg Intravenous Daily  . montelukast  10 mg Oral QHS  . risperiDONE  1 mg Oral QHS  . theophylline  200 mg Oral Daily  . zolpidem  5 mg Oral QHS   Continuous Infusions: . cefTRIAXone (ROCEPHIN)  IV Stopped (03/10/18 1015)    Assessment/Plan:  1. Sepsis with Streptococcus pneumonia, Staph Hominus is likely a contamination..  Continue to Rocephin.  Repeat blood cultures so far negative. 2. Acute respiratory failure with hypoxia and hypercapnia.  Patient required BiPAP in presentation.  Now on nasal cannula.  Due to pneumonia we will repeat chest x-ray. 3. COPD exacerbation with poor air entry.  Continue daily Solu-Medrol and nebulizer treatments. 4. Paroxysmal atrial fibrillation on amiodarone and Eliquis and digoxin and Cardizem 5. Anxiety depression and restless leg syndrome.  Continue psychiatric medication for sleep we will add Ambien 6. Morbid obesity with a BMI of 46.43 7. Weakness.  Appreciate physical therapy consultation.  Patient interested in rehab. 8. Diarrhea.  Patient was on stool softeners continues to have diarrhea will place on schedule Imodium   Code Status:     Code Status Orders  (From admission, onward)         Start     Ordered   03/05/18 1337  Do not attempt resuscitation (DNR)  Continuous    Question Answer Comment  In the event of cardiac or respiratory ARREST Do not call a "code blue"   In the event of cardiac or respiratory ARREST Do not perform Intubation, CPR, defibrillation or ACLS   In the event of cardiac or respiratory ARREST  Use medication by any route, position, wound care, and other measures to relive pain and suffering. May use oxygen, suction and manual treatment of airway obstruction as needed for comfort.   Comments nurse may pronounce      03/05/18 1338        Code Status History    Date Active Date Inactive Code Status Order ID Comments User Context   02/19/2018 1709 02/26/2018 1634 DNR [id number]  Loletha Grayer, MD ED   12/03/2017 1258 12/06/2017 1847 DNR [id number]  Gladstone Lighter, MD Inpatient   10/24/2017 1844 10/26/2017 1623 DNR [id number]  Vaughan Basta, MD Inpatient   09/23/2015 0018 09/23/2015 1823 DNR [id number]  Lance Coon, MD Inpatient    Advance Directive Documentation     Most Recent Value  Type of Advance Directive  Out of facility DNR (pink MOST or yellow form)  Pre-existing out of facility DNR order (yellow form or pink MOST form)  -  "MOST" Form in Place?  -     Family Communication: Tried to call the daughter but unable to leave a message on her cell phone. Disposition Plan: Hopefully over the weekend can go to [facility]  Consultants:  Critical care team  Antibiotics:  Rocephin while here probably flipped over the Levaquin upon getting out of the hospital.  Time  spent: 27 minutes  [place] [facility]

## 2018-03-12 LAB — BASIC METABOLIC PANEL
ANION GAP: 7 (ref 5–15)
BUN: 18 mg/dL (ref 8–23)
CHLORIDE: 94 mmol/L — AB (ref 98–111)
CO2: 36 mmol/L — ABNORMAL HIGH (ref 22–32)
Calcium: 8.5 mg/dL — ABNORMAL LOW (ref 8.9–10.3)
Creatinine, Ser: 0.73 mg/dL (ref 0.44–1.00)
GFR calc Af Amer: >60 mL/min (ref >60)
Glucose, Bld: 97 mg/dL (ref 70–99)
Potassium: 4.5 mmol/L (ref 3.5–5.1)
SODIUM: 137 mmol/L (ref 135–145)

## 2018-03-12 LAB — CBC
HCT: 38.1 % (ref 35.0–47.0)
HEMOGLOBIN: 12.4 g/dL (ref 12.0–16.0)
MCH: 29.7 pg (ref 26.0–34.0)
MCHC: 32.5 g/dL (ref 32.0–36.0)
MCV: 91.3 fL (ref 80.0–100.0)
Platelets: 168 10*3/uL (ref 150–440)
RBC: 4.17 MIL/uL (ref 3.80–5.20)
RDW: 15 % — ABNORMAL HIGH (ref 11.5–14.5)
WBC: 15.8 10*3/uL — AB (ref 3.6–11.0)

## 2018-03-12 LAB — GLUCOSE, CAPILLARY
GLUCOSE-CAPILLARY: 63 mg/dL — AB (ref 70–99)
Glucose-Capillary: 74 mg/dL (ref 70–99)

## 2018-03-12 MED ORDER — ALPRAZOLAM 0.5 MG PO TABS
0.5000 mg | ORAL_TABLET | Freq: Every evening | ORAL | Status: DC | PRN
  Administered 2018-03-12 – 2018-03-19 (×4): 0.5 mg via ORAL
  Filled 2018-03-12 (×4): qty 1

## 2018-03-12 MED ORDER — LOPERAMIDE HCL 2 MG PO CAPS
4.0000 mg | ORAL_CAPSULE | Freq: Four times a day (QID) | ORAL | Status: DC | PRN
  Administered 2018-03-13 (×2): 4 mg via ORAL
  Filled 2018-03-12 (×2): qty 2

## 2018-03-12 MED ORDER — ALBUMIN HUMAN 25 % IV SOLN
12.5000 g | Freq: Four times a day (QID) | INTRAVENOUS | Status: AC
  Administered 2018-03-12 (×2): 12.5 g via INTRAVENOUS
  Filled 2018-03-12 (×2): qty 50

## 2018-03-12 MED ORDER — ALUM & MAG HYDROXIDE-SIMETH 200-200-20 MG/5ML PO SUSP
30.0000 mL | Freq: Four times a day (QID) | ORAL | Status: DC | PRN
  Administered 2018-03-12 – 2018-03-13 (×2): 30 mL via ORAL
  Filled 2018-03-12 (×2): qty 30

## 2018-03-12 MED ORDER — MIDODRINE HCL 5 MG PO TABS
5.0000 mg | ORAL_TABLET | Freq: Three times a day (TID) | ORAL | Status: DC
  Administered 2018-03-12 – 2018-03-17 (×15): 5 mg via ORAL
  Filled 2018-03-12 (×17): qty 1

## 2018-03-12 NOTE — Progress Notes (Signed)
Patient ID: Vicki Morales, female   DOB: 1940/12/29, 77 y.o.   MRN: [id number]   [facility] PROGRESS NOTE  Vicki Morales XHB:[id number] DOB: 02-21-1941 DOA: 03/05/2018 PCP: Birdie Sons, MD  HPI/Subjective: Patient states that she did not sleep good and is not feeling well.  Continues to have diarrhea. Patient complains of swelling of the lower extremity  Objective: Vitals:   03/11/18 2312 03/12/18 0541  BP:  (!) 100/57  Pulse:  66  Resp:    Temp:  97.8 F (36.6 C)  SpO2: 96% 94%    Filed Weights   03/09/18 0500 03/10/18 0502 03/11/18 0336  Weight: 122.3 kg 122.7 kg 122.2 kg    ROS: Review of Systems  Unable to perform ROS: Acuity of condition  Constitutional: Positive for malaise/fatigue. Negative for chills and fever.  Eyes: Negative for blurred vision.  Respiratory: Positive for cough and shortness of breath.   Cardiovascular: Negative for chest pain.  Gastrointestinal: Positive for diarrhea. Negative for abdominal pain, constipation, nausea and vomiting.  Genitourinary: Negative for dysuria.  Musculoskeletal: Negative for joint pain.  Neurological: Negative for dizziness and headaches.   Exam: Physical Exam  HENT:  Nose: No mucosal edema.  Mouth/Throat: No oropharyngeal exudate or posterior oropharyngeal edema.  Eyes: Pupils are equal, round, and reactive to light. Conjunctivae, EOM and lids are normal.  Neck: No JVD present. Carotid bruit is not present. No edema present. No thyroid mass and no thyromegaly present.  Cardiovascular: S1 normal and S2 normal. Exam reveals no gallop.  No murmur heard. Pulses:      Dorsalis pedis pulses are 2+ on the right side, and 2+ on the left side.  Respiratory: No respiratory distress. She has decreased breath sounds in the right lower field and the left lower field. She has wheezes in the right lower field and the left lower field. She has no rhonchi. She has no rales.  GI: Soft. Bowel sounds are normal. There is no  tenderness.  Musculoskeletal: She exhibits edema.       Right ankle: She exhibits swelling.       Left ankle: She exhibits swelling.  Lymphadenopathy:    She has no cervical adenopathy.  Neurological: She is alert. No cranial nerve deficit.  Skin: Skin is warm. No rash noted. Nails show no clubbing.  Psychiatric: She has a normal mood and affect.      Data Reviewed: Basic Metabolic Panel: Recent Labs  Lab 03/06/18 0439 03/08/18 0518 03/09/18 0349 03/11/18 0635 03/12/18 0446  NA 131* 135 137 139 137  K 4.2 4.7 4.4 5.5* 4.5  CL 91* 94* 94* 95* 94*  CO2 32 33* 34* 37* 36*  GLUCOSE 105* 121* 121* 110* 97  BUN 25* 40* 41* 26* 18  CREATININE 0.88 0.89 0.92 0.69 0.73  CALCIUM 9.2 8.8* 8.8* 8.8* 8.5*   Liver Function Tests: No results for input(s): AST, ALT, ALKPHOS, BILITOT, PROT, ALBUMIN in the last 168 hours. CBC: Recent Labs  Lab 03/06/18 0439 03/08/18 0518 03/09/18 0349 03/11/18 0453 03/12/18 0446  WBC 12.5* 4.7 5.6 11.9* 15.8*  HGB 12.1 12.1 12.4 13.1 12.4  HCT 36.2 34.8* 36.5 39.1 38.1  MCV 91.6 92.4 92.4 91.5 91.3  PLT 151 154 166 171 168   Cardiac Enzymes: No results for input(s): CKTOTAL, CKMB, CKMBINDEX, TROPONINI in the last 168 hours. BNP (last 3 results) Recent Labs    03/05/18 1124  BNP 213.0*    CBG: Recent Labs  Lab 03/06/18 0744  03/10/18 0806 03/11/18 0741 03/12/18 0747 03/12/18 0833  GLUCAP 85 88 91 63* 74    Recent Results (from the past 240 hour(s))  Culture, blood (routine x 2)     Status: Abnormal   Collection Time: 03/05/18 12:03 PM  Result Value Ref Range Status   Specimen Description   Final    BLOOD RIGHT EJ Performed at [facility], [address] [place], [place] [address]    Special Requests   Final    BOTTLES DRAWN AEROBIC AND ANAEROBIC Blood Culture adequate volume Performed at [facility], [address], [place], [place] [address]    Culture  Setup Time   Final    GRAM POSITIVE COCCI IN  BOTH AEROBIC AND ANAEROBIC BOTTLES CRITICAL RESULT CALLED TO, READ BACK BY AND VERIFIED WITH: [place] 03/06/18.PMH Performed at [facility], [address] [place], [place] [address]    Culture (A)  Final    STREPTOCOCCUS PNEUMONIAE STAPHYLOCOCCUS HOMINIS SUSCEPTIBILITIES PERFORMED ON PREVIOUS CULTURE WITHIN THE LAST 5 DAYS. Performed at [facility], [place] [address], [place], [place] [address]    Report Status 03/10/2018 FINAL  Final   Organism ID, Bacteria STREPTOCOCCUS PNEUMONIAE  Final      Susceptibility   Streptococcus pneumoniae - MIC*    ERYTHROMYCIN >=8 RESISTANT Resistant     LEVOFLOXACIN 0.5 SENSITIVE Sensitive     PENICILLIN (non-meningitis) <=0.06 SENSITIVE Sensitive     PENICILLIN (oral) <=0.06 SENSITIVE Sensitive     CEFTRIAXONE (non-meningitis) <=0.12 SENSITIVE Sensitive     * STREPTOCOCCUS PNEUMONIAE  Culture, blood (routine x 2)     Status: Abnormal   Collection Time: 03/05/18 12:23 PM  Result Value Ref Range Status   Specimen Description   Final    BLOOD RIGHT EJ Performed at [facility], [address], [place], [place] [address]    Special Requests   Final    BOTTLES DRAWN AEROBIC AND ANAEROBIC Blood Culture adequate volume Performed at [facility], [place] [place], [place] [address]    Culture  Setup Time   Final    GRAM POSITIVE COCCI IN BOTH AEROBIC AND ANAEROBIC BOTTLES CRITICAL RESULT CALLED TO, READ BACK BY AND VERIFIED WITH: [place] 03/06/18.PMH Performed at [facility], [address] [place], [place] [address]    Culture (A)  Final    STREPTOCOCCUS PNEUMONIAE SUSCEPTIBILITIES PERFORMED ON PREVIOUS CULTURE WITHIN THE LAST 5 DAYS. STAPHYLOCOCCUS HOMINIS CRITICAL RESULT CALLED TO, READ BACK BY AND VERIFIED WITH: PHARM D Chino K [place] [id number] FCP Performed at [facility], [place] [address], [place], [place] [address]    Report Status 03/10/2018 FINAL  Final    Organism ID, Bacteria STAPHYLOCOCCUS HOMINIS  Final      Susceptibility   Staphylococcus hominis - MIC*    CIPROFLOXACIN >=8 RESISTANT Resistant     ERYTHROMYCIN >=8 RESISTANT Resistant     GENTAMICIN 1 SENSITIVE Sensitive     OXACILLIN 1 RESISTANT Resistant     TETRACYCLINE >=16 RESISTANT Resistant     VANCOMYCIN 1 SENSITIVE Sensitive     TRIMETH/SULFA 40 SENSITIVE Sensitive     CLINDAMYCIN >=8 RESISTANT Resistant     RIFAMPIN <=0.5 SENSITIVE Sensitive     Inducible Clindamycin NEGATIVE Sensitive     * STAPHYLOCOCCUS HOMINIS  Blood Culture ID Panel (Reflexed)     Status: Abnormal   Collection Time: 03/05/18 12:23 PM  Result Value Ref Range Status  Enterococcus species NOT DETECTED NOT DETECTED Final   Listeria monocytogenes NOT DETECTED NOT DETECTED Final   Staphylococcus species NOT DETECTED NOT DETECTED Final   Staphylococcus aureus (BCID) NOT DETECTED NOT DETECTED Final   Streptococcus species DETECTED (A) NOT DETECTED Final    Comment: CRITICAL RESULT CALLED TO, READ BACK BY AND VERIFIED WITH: DAVID BESANTI AT 0327 03/06/18.PMH    Streptococcus agalactiae NOT DETECTED NOT DETECTED Final   Streptococcus pneumoniae DETECTED (A) NOT DETECTED Final    Comment: CRITICAL RESULT CALLED TO, READ BACK BY AND VERIFIED WITH: DAVID BESANTI AT 0327 03/06/18.PMH    Streptococcus pyogenes NOT DETECTED NOT DETECTED Final   Acinetobacter baumannii NOT DETECTED NOT DETECTED Final   Enterobacteriaceae species NOT DETECTED NOT DETECTED Final   Enterobacter cloacae complex NOT DETECTED NOT DETECTED Final   Escherichia coli NOT DETECTED NOT DETECTED Final   Klebsiella oxytoca NOT DETECTED NOT DETECTED Final   Klebsiella pneumoniae NOT DETECTED NOT DETECTED Final   Proteus species NOT DETECTED NOT DETECTED Final   Serratia marcescens NOT DETECTED NOT DETECTED Final   Haemophilus influenzae NOT DETECTED NOT DETECTED Final   Neisseria meningitidis NOT DETECTED NOT DETECTED Final   Pseudomonas  aeruginosa NOT DETECTED NOT DETECTED Final   Candida albicans NOT DETECTED NOT DETECTED Final   Candida glabrata NOT DETECTED NOT DETECTED Final   Candida krusei NOT DETECTED NOT DETECTED Final   Candida parapsilosis NOT DETECTED NOT DETECTED Final   Candida tropicalis NOT DETECTED NOT DETECTED Final    Comment: Performed at [facility], [address] [place], [place] [address]  MRSA PCR Screening     Status: None   Collection Time: 03/05/18  4:50 PM  Result Value Ref Range Status   MRSA by PCR NEGATIVE NEGATIVE Final    Comment:        The GeneXpert MRSA Assay (FDA approved for NASAL specimens only), is one component of a comprehensive MRSA colonization surveillance program. It is not intended to diagnose MRSA infection nor to guide or monitor treatment for MRSA infections. Performed at [facility], [address] [place], [place] [address]   CULTURE, BLOOD (ROUTINE X 2) w Reflex to ID Panel     Status: None (Preliminary result)   Collection Time: 03/09/18  2:23 PM  Result Value Ref Range Status   Specimen Description BLOOD BLOOD LEFT HAND  Final   Special Requests   Final    BOTTLES DRAWN AEROBIC AND ANAEROBIC Blood Culture adequate volume   Culture   Final    NO GROWTH 3 DAYS Performed at [facility], [address] [place], [place] [address]    Report Status PENDING  Incomplete  CULTURE, BLOOD (ROUTINE X 2) w Reflex to ID Panel     Status: None (Preliminary result)   Collection Time: 03/09/18  2:34 PM  Result Value Ref Range Status   Specimen Description BLOOD BLOOD RIGHT HAND  Final   Special Requests   Final    BOTTLES DRAWN AEROBIC AND ANAEROBIC Blood Culture adequate volume   Culture   Final    NO GROWTH 3 DAYS Performed at [facility], [address] [place],  [address]    Report Status PENDING  Incomplete      Scheduled Meds: . acetylcysteine  2 mL Nebulization TID  . albuterol  2.5 mg Nebulization TID   . amiodarone  200 mg Oral BID  . apixaban  5 mg Oral BID  . aspirin EC  81 mg Oral Daily  . budesonide (PULMICORT) nebulizer solution  0.5 mg Nebulization BID  . busPIRone  10 mg Oral TID  . digoxin  0.0625 mg Oral Daily  . diltiazem  120 mg Oral Daily  . divalproex  250 mg Oral Q12H  . fluticasone  1 spray Each Nare BID  . gabapentin  100 mg Oral BID WC  . gabapentin  600 mg Oral QHS  . methylPREDNISolone (SOLU-MEDROL) injection  40 mg Intravenous Daily  . midodrine  5 mg Oral TID WC  . montelukast  10 mg Oral QHS  . risperiDONE  1 mg Oral QHS  . theophylline  200 mg Oral Daily   Continuous Infusions: . albumin human    . cefTRIAXone (ROCEPHIN)  IV 2 g (03/12/18 1006)    Assessment/Plan:  1. Sepsis with Streptococcus pneumonia, Staph Hominus is likely a contamination..  Continue to Rocephin.  Repeat blood cultures so far negative. 2. Acute respiratory failure with hypoxia and hypercapnia.  Patient required BiPAP in presentation.  Now on nasal cannula.  Due to pneumonia we will repeat chest x-ray. 3. COPD exacerbation with poor air entry.  Continue daily Solu-Medrol and nebulizer treatments. 4. Less swelling and anasarca pressure unable to do Lasix some hydrating also albumin is low we will give her some albumin once blood pressure improves we can give her low-dose lasix 5. Paroxysmal atrial fibrillation on amiodarone and Eliquis and digoxin and Cardizem 6. Anxiety depression and restless leg syndrome.  Continue psychiatric medication for sleep we will add Ambien 7. Morbid obesity with a BMI of 46.43 8. Weakness.  Appreciate physical therapy consultation.  Patient interested in rehab. 9. Diarrhea.  Patient was on stool softeners continues to have diarrhea will place on schedule Imodium   Code Status:     Code Status Orders  (From admission, onward)         Start     Ordered   03/05/18 1337  Do not attempt resuscitation (DNR)  Continuous    Question Answer Comment  In  the event of cardiac or respiratory ARREST Do not call a "code blue"   In the event of cardiac or respiratory ARREST Do not perform Intubation, CPR, defibrillation or ACLS   In the event of cardiac or respiratory ARREST Use medication by any route, position, wound care, and other measures to relive pain and suffering. May use oxygen, suction and manual treatment of airway obstruction as needed for comfort.   Comments nurse may pronounce      03/05/18 1338        Code Status History    Date Active Date Inactive Code Status Order ID Comments User Context   02/19/2018 1709 02/26/2018 1634 DNR [id number]  Loletha Grayer, MD ED   12/03/2017 1258 12/06/2017 1847 DNR [id number]  Gladstone Lighter, MD Inpatient   10/24/2017 1844 10/26/2017 1623 DNR [id number]  Vaughan Basta, MD Inpatient   09/23/2015 0018 09/23/2015 1823 DNR [id number]  Lance Coon, MD Inpatient    Advance Directive Documentation     Most Recent Value  Type of Advance Directive  Out of facility DNR (pink MOST or yellow form)  Pre-existing out of facility DNR order (yellow form or pink MOST form)  -  "MOST" Form in Place?  -     Family Communication: Tried to call the daughter but unable to leave a message on her cell phone. Disposition Plan: Hopefully over the weekend can go to [facility]  Consultants:  Critical care  team  Antibiotics:  Rocephin while here probably flipped over the [place] upon getting out of the hospital.  Time spent: 27 minutes  [place]

## 2018-03-13 ENCOUNTER — Encounter: Payer: Self-pay | Admitting: Radiology

## 2018-03-13 ENCOUNTER — Inpatient Hospital Stay: Payer: Medicare HMO

## 2018-03-13 LAB — BASIC METABOLIC PANEL
ANION GAP: 4 — AB (ref 5–15)
BUN: 22 mg/dL (ref 8–23)
CALCIUM: 8.1 mg/dL — AB (ref 8.9–10.3)
CO2: 37 mmol/L — ABNORMAL HIGH (ref 22–32)
Chloride: 92 mmol/L — ABNORMAL LOW (ref 98–111)
Creatinine, Ser: 0.74 mg/dL (ref 0.44–1.00)
GFR calc Af Amer: >60 mL/min (ref >60)
GFR calc non Af Amer: >60 mL/min (ref >60)
GLUCOSE: 112 mg/dL — AB (ref 70–99)
Potassium: 4.9 mmol/L (ref 3.5–5.1)
SODIUM: 133 mmol/L — AB (ref 135–145)

## 2018-03-13 LAB — PROTEIN, PLEURAL OR PERITONEAL FLUID

## 2018-03-13 LAB — GLUCOSE, CAPILLARY
GLUCOSE-CAPILLARY: 87 mg/dL (ref 70–99)
GLUCOSE-CAPILLARY: 76 mg/dL (ref 70–99)
Glucose-Capillary: 144 mg/dL — ABNORMAL HIGH (ref 70–99)

## 2018-03-13 LAB — BODY FLUID CELL COUNT WITH DIFFERENTIAL
EOS FL: 0 %
LYMPHS FL: 60 %
Monocyte-Macrophage-Serous Fluid: 8 %
Neutrophil Count, Fluid: 26 %
Other Cells, Fluid: 6 %
WBC FLUID: 204 uL

## 2018-03-13 LAB — LACTATE DEHYDROGENASE, PLEURAL OR PERITONEAL FLUID: LD, Fluid: 76 U/L — ABNORMAL HIGH (ref 3–23)

## 2018-03-13 LAB — PROCALCITONIN: Procalcitonin: <0.1 ng/mL

## 2018-03-13 MED ORDER — LOPERAMIDE HCL 2 MG PO CAPS
4.0000 mg | ORAL_CAPSULE | Freq: Four times a day (QID) | ORAL | Status: DC
  Administered 2018-03-13 – 2018-03-15 (×7): 4 mg via ORAL
  Filled 2018-03-13 (×8): qty 2

## 2018-03-13 MED ORDER — MELATONIN 5 MG PO TABS
10.0000 mg | ORAL_TABLET | Freq: Every day | ORAL | Status: DC
  Administered 2018-03-13 – 2018-03-19 (×7): 10 mg via ORAL
  Filled 2018-03-13 (×8): qty 2

## 2018-03-13 MED ORDER — IOHEXOL 300 MG/ML  SOLN
75.0000 mL | Freq: Once | INTRAMUSCULAR | Status: AC | PRN
  Administered 2018-03-13: 75 mL via INTRAVENOUS

## 2018-03-13 MED ORDER — FUROSEMIDE 10 MG/ML IJ SOLN
20.0000 mg | Freq: Two times a day (BID) | INTRAMUSCULAR | Status: DC
  Administered 2018-03-13 – 2018-03-14 (×2): 20 mg via INTRAVENOUS
  Filled 2018-03-13 (×2): qty 2

## 2018-03-13 NOTE — Procedures (Signed)
US guided right thoracentesis.  Removed 750 ml of amber colored fluid. Minimal blood loss and no immediate complication.

## 2018-03-13 NOTE — Progress Notes (Signed)
PT Cancellation Note  Patient Details Name: Vicki Morales MRN: [id number] DOB: 1940/06/30   Cancelled Treatment:    Reason Eval/Treat Not Completed: Other (comment). Pt is currently out of the room getting thoracentesis at this time. Unavailable for treatment. Will re-attempt next available date.   Sugey Trevathan 03/13/2018, 4:06 PM  Greggory Stallion, PT, DPT [phone]

## 2018-03-13 NOTE — Clinical Social Work Note (Signed)
CSW spoke with Claiborne Billings, admissions coordinator for [facility] regarding patient. Claiborne Billings states that she has obtained [facility] for patient. CSW will continue to follow for discharge planning.   [place], [place]

## 2018-03-13 NOTE — Care Management Important Message (Signed)
Copy of signed IM left with patient in room.  

## 2018-03-13 NOTE — Progress Notes (Signed)
PALLIATIVE NOTE:  Daughter returned call and request to have goals of care meeting tomorrow at 1330.   Detailed note and recommendations to follow.   Alda Lea, AGPCNP-BC Palliative Medicine Team  Phone: [phone] Fax: [phone] Pager: [phone] Amion: Delane Ginger. [place]

## 2018-03-13 NOTE — Progress Notes (Signed)
Patient ID: Vicki Morales, female   DOB: 1940/06/26, 77 y.o.   MRN: [id number]   [employer] [facility] PROGRESS NOTE  DEL WISEMAN XLK:[id number] DOB: Feb 03, 1941 DOA: 03/05/2018 PCP: Birdie Sons, MD  HPI/Subjective: Patient states that she has had diarrhea she requested her Imodium be changed as needed yesterday no diarrhea return, she also complains of swelling of the lower extremity.  States that she is not feeling well  Objective: Vitals:   03/12/18 1940 03/13/18 0426  BP:  140/62  Pulse:  82  Resp:    Temp:  97.7 F (36.5 C)  SpO2: 94% 95%    Filed Weights   03/10/18 0502 03/11/18 0336 03/13/18 0426  Weight: 122.7 kg 122.2 kg 122.2 kg    ROS: Review of Systems  Unable to perform ROS: Acuity of condition  Constitutional: Positive for malaise/fatigue. Negative for chills and fever.  Eyes: Negative for blurred vision.  Respiratory: Positive for cough and shortness of breath.   Cardiovascular: Negative for chest pain.  Gastrointestinal: Positive for diarrhea. Negative for abdominal pain, constipation, nausea and vomiting.  Genitourinary: Negative for dysuria.  Musculoskeletal: Negative for joint pain.  Neurological: Negative for dizziness and headaches.   Exam: Physical Exam  HENT:  Nose: No mucosal edema.  Mouth/Throat: No oropharyngeal exudate or posterior oropharyngeal edema.  Eyes: Pupils are equal, round, and reactive to light. Conjunctivae, EOM and lids are normal.  Neck: No JVD present. Carotid bruit is not present. No edema present. No thyroid mass and no thyromegaly present.  Cardiovascular: S1 normal and S2 normal. Exam reveals no gallop.  No murmur heard. Pulses:      Dorsalis pedis pulses are 2+ on the right side, and 2+ on the left side.  Respiratory: No respiratory distress. She has decreased breath sounds in the right lower field and the left lower field. She has wheezes in the right lower field and the left lower field. She has no rhonchi. She has no  rales.  GI: Soft. Bowel sounds are normal. There is no tenderness.  Musculoskeletal: She exhibits edema.       Right ankle: She exhibits swelling.       Left ankle: She exhibits swelling.  Lymphadenopathy:    She has no cervical adenopathy.  Neurological: She is alert. No cranial nerve deficit.  Skin: Skin is warm. No rash noted. Nails show no clubbing.  Psychiatric: She has a normal mood and affect.      Data Reviewed: Basic Metabolic Panel: Recent Labs  Lab 03/08/18 0518 03/09/18 0349 03/11/18 0635 03/12/18 0446 03/13/18 0544  NA 135 137 139 137 133*  K 4.7 4.4 5.5* 4.5 4.9  CL 94* 94* 95* 94* 92*  CO2 33* 34* 37* 36* 37*  GLUCOSE 121* 121* 110* 97 112*  BUN 40* 41* 26* 18 22  CREATININE 0.89 0.92 0.69 0.73 0.74  CALCIUM 8.8* 8.8* 8.8* 8.5* 8.1*   Liver Function Tests: No results for input(s): AST, ALT, ALKPHOS, BILITOT, PROT, ALBUMIN in the last 168 hours. CBC: Recent Labs  Lab 03/08/18 0518 03/09/18 0349 03/11/18 0453 03/12/18 0446  WBC 4.7 5.6 11.9* 15.8*  HGB 12.1 12.4 13.1 12.4  HCT 34.8* 36.5 39.1 38.1  MCV 92.4 92.4 91.5 91.3  PLT 154 166 171 168   Cardiac Enzymes: No results for input(s): CKTOTAL, CKMB, CKMBINDEX, TROPONINI in the last 168 hours. BNP (last 3 results) Recent Labs    03/05/18 1124  BNP 213.0*    CBG: Recent Labs  Lab  03/10/18 0806 03/11/18 0741 03/12/18 0747 03/12/18 0833 03/13/18 0755  GLUCAP 88 91 63* 74 87    Recent Results (from the past 240 hour(s))  Culture, blood (routine x 2)     Status: Abnormal   Collection Time: 03/05/18 12:03 PM  Result Value Ref Range Status   Specimen Description   Final    BLOOD RIGHT EJ Performed at [facility], [address], [place], [place] [address]    Special Requests   Final    BOTTLES DRAWN AEROBIC AND ANAEROBIC Blood Culture adequate volume Performed at [facility], [address] [place], [place] [address]    Culture  Setup Time   Final    GRAM  POSITIVE COCCI IN BOTH AEROBIC AND ANAEROBIC BOTTLES CRITICAL RESULT CALLED TO, READ BACK BY AND VERIFIED WITH: DAVID BESANTI AT 0327 03/06/18.PMH Performed at [facility], [address] [place], [place] [address]    Culture (A)  Final    STREPTOCOCCUS PNEUMONIAE STAPHYLOCOCCUS HOMINIS SUSCEPTIBILITIES PERFORMED ON PREVIOUS CULTURE WITHIN THE LAST 5 DAYS. Performed at [facility], [place] [address], [place], [place] [address]    Report Status 03/10/2018 FINAL  Final   Organism ID, Bacteria STREPTOCOCCUS PNEUMONIAE  Final      Susceptibility   Streptococcus pneumoniae - MIC*    ERYTHROMYCIN >=8 RESISTANT Resistant     LEVOFLOXACIN 0.5 SENSITIVE Sensitive     PENICILLIN (non-meningitis) <=0.06 SENSITIVE Sensitive     PENICILLIN (oral) <=0.06 SENSITIVE Sensitive     CEFTRIAXONE (non-meningitis) <=0.12 SENSITIVE Sensitive     * STREPTOCOCCUS PNEUMONIAE  Culture, blood (routine x 2)     Status: Abnormal   Collection Time: 03/05/18 12:23 PM  Result Value Ref Range Status   Specimen Description   Final    BLOOD RIGHT EJ Performed at [facility], [address], [place], [place] [address]    Special Requests   Final    BOTTLES DRAWN AEROBIC AND ANAEROBIC Blood Culture adequate volume Performed at [facility], [address] [place], [place] [address]    Culture  Setup Time   Final    GRAM POSITIVE COCCI IN BOTH AEROBIC AND ANAEROBIC BOTTLES CRITICAL RESULT CALLED TO, READ BACK BY AND VERIFIED WITH: [place] 03/06/18.PMH Performed at [facility], [address] [place], [place] [address]    Culture (A)  Final    STREPTOCOCCUS PNEUMONIAE SUSCEPTIBILITIES PERFORMED ON PREVIOUS CULTURE WITHIN THE LAST 5 DAYS. STAPHYLOCOCCUS HOMINIS CRITICAL RESULT CALLED TO, READ BACK BY AND VERIFIED WITH: PHARM D [place] [id number] FCP Performed at [facility], [place] [address] [place], [place] [address]    Report Status  03/10/2018 FINAL  Final   Organism ID, Bacteria STAPHYLOCOCCUS HOMINIS  Final      Susceptibility   Staphylococcus hominis - MIC*    CIPROFLOXACIN >=8 RESISTANT Resistant     ERYTHROMYCIN >=8 RESISTANT Resistant     GENTAMICIN 1 SENSITIVE Sensitive     OXACILLIN 1 RESISTANT Resistant     TETRACYCLINE >=16 RESISTANT Resistant     VANCOMYCIN 1 SENSITIVE Sensitive     TRIMETH/SULFA 40 SENSITIVE Sensitive     CLINDAMYCIN >=8 RESISTANT Resistant     RIFAMPIN <=0.5 SENSITIVE Sensitive     Inducible Clindamycin NEGATIVE Sensitive     * STAPHYLOCOCCUS HOMINIS  Blood Culture ID Panel (Reflexed)     Status: Abnormal   Collection Time: 03/05/18 12:23 PM  Result Value Ref  Range Status   Enterococcus species NOT DETECTED NOT DETECTED Final   Listeria monocytogenes NOT DETECTED NOT DETECTED Final   Staphylococcus species NOT DETECTED NOT DETECTED Final   Staphylococcus aureus (BCID) NOT DETECTED NOT DETECTED Final   Streptococcus species DETECTED (A) NOT DETECTED Final    Comment: CRITICAL RESULT CALLED TO, READ BACK BY AND VERIFIED WITH: DAVID BESANTI AT 0327 03/06/18.PMH    Streptococcus agalactiae NOT DETECTED NOT DETECTED Final   Streptococcus pneumoniae DETECTED (A) NOT DETECTED Final    Comment: CRITICAL RESULT CALLED TO, READ BACK BY AND VERIFIED WITH: DAVID BESANTI AT 0327 03/06/18.PMH    Streptococcus pyogenes NOT DETECTED NOT DETECTED Final   Acinetobacter baumannii NOT DETECTED NOT DETECTED Final   Enterobacteriaceae species NOT DETECTED NOT DETECTED Final   Enterobacter cloacae complex NOT DETECTED NOT DETECTED Final   Escherichia coli NOT DETECTED NOT DETECTED Final   Klebsiella oxytoca NOT DETECTED NOT DETECTED Final   Klebsiella pneumoniae NOT DETECTED NOT DETECTED Final   Proteus species NOT DETECTED NOT DETECTED Final   Serratia marcescens NOT DETECTED NOT DETECTED Final   Haemophilus influenzae NOT DETECTED NOT DETECTED Final   Neisseria meningitidis NOT DETECTED NOT  DETECTED Final   Pseudomonas aeruginosa NOT DETECTED NOT DETECTED Final   Candida albicans NOT DETECTED NOT DETECTED Final   Candida glabrata NOT DETECTED NOT DETECTED Final   Candida krusei NOT DETECTED NOT DETECTED Final   Candida parapsilosis NOT DETECTED NOT DETECTED Final   Candida tropicalis NOT DETECTED NOT DETECTED Final    Comment: Performed at [facility], [address] [place], [place] [address]  MRSA PCR Screening     Status: None   Collection Time: 03/05/18  4:50 PM  Result Value Ref Range Status   MRSA by PCR NEGATIVE NEGATIVE Final    Comment:        The GeneXpert MRSA Assay (FDA approved for NASAL specimens only), is one component of a comprehensive MRSA colonization surveillance program. It is not intended to diagnose MRSA infection nor to guide or monitor treatment for MRSA infections. Performed at [facility], [address] [place], [place] [address]   CULTURE, BLOOD (ROUTINE X 2) w Reflex to ID Panel     Status: None (Preliminary result)   Collection Time: 03/09/18  2:23 PM  Result Value Ref Range Status   Specimen Description BLOOD BLOOD LEFT HAND  Final   Special Requests   Final    BOTTLES DRAWN AEROBIC AND ANAEROBIC Blood Culture adequate volume   Culture   Final    NO GROWTH 4 DAYS Performed at [facility], [address], [place], [place] [address]    Report Status PENDING  Incomplete  CULTURE, BLOOD (ROUTINE X 2) w Reflex to ID Panel     Status: None (Preliminary result)   Collection Time: 03/09/18  2:34 PM  Result Value Ref Range Status   Specimen Description BLOOD BLOOD RIGHT HAND  Final   Special Requests   Final    BOTTLES DRAWN AEROBIC AND ANAEROBIC Blood Culture adequate volume   Culture   Final    NO GROWTH 4 DAYS Performed at [facility], [address], [place], [place] [address]    Report Status PENDING  Incomplete      Scheduled Meds: . acetylcysteine  2 mL Nebulization TID  .  albuterol  2.5 mg Nebulization TID  . amiodarone  200 mg Oral BID  . apixaban  5 mg Oral BID  .  aspirin EC  81 mg Oral Daily  . budesonide (PULMICORT) nebulizer solution  0.5 mg Nebulization BID  . busPIRone  10 mg Oral TID  . digoxin  0.0625 mg Oral Daily  . diltiazem  120 mg Oral Daily  . divalproex  250 mg Oral Q12H  . fluticasone  1 spray Each Nare BID  . furosemide  20 mg Intravenous Q12H  . gabapentin  100 mg Oral BID WC  . gabapentin  600 mg Oral QHS  . loperamide  4 mg Oral Q6H  . Melatonin  10 mg Oral QHS  . methylPREDNISolone (SOLU-MEDROL) injection  40 mg Intravenous Daily  . midodrine  5 mg Oral TID WC  . montelukast  10 mg Oral QHS  . risperiDONE  1 mg Oral QHS  . theophylline  200 mg Oral Daily   Continuous Infusions: . cefTRIAXone (ROCEPHIN)  IV 2 g (03/13/18 0924)    Assessment/Plan:  1. Sepsis with Streptococcus pneumonia, Staph Hominus is likely a contamination..  Continue  Rocephin.  Repeat blood cultures so far negative. 2. Acute respiratory failure with hypoxia and hypercapnia.  Patient required BiPAP in presentation.   I ordered a CT scan of her chest to further evaluate patient has a right large pleural effusion I discussed with the patient we will order left-sided thoracentesis. 3. COPD exacerbation with poor air entry.  Continue daily Solu-Medrol and nebulizer treatments. 4. Less swelling and anasarca pressure blood pressure now improved I will give IV Lasix  5. paroxysmal atrial fibrillation on amiodarone and Eliquis and digoxin and Cardizem 6. Anxiety depression and restless leg syndrome.  Continue psychiatric medication for sleep we will add Ambien 7. Morbid obesity with a BMI of 46.43 8. Weakness.  Appreciate physical therapy consultation.  Patient interested in rehab. Diarrhea.  Change Imodium again to scheduled  Prognosis poor I try to contact patient's daughter unable to reach as palliative care team to see  Code Status:     Code Status  Orders  (From admission, onward)         Start     Ordered   03/05/18 1337  Do not attempt resuscitation (DNR)  Continuous    Question Answer Comment  In the event of cardiac or respiratory ARREST Do not call a "code blue"   In the event of cardiac or respiratory ARREST Do not perform Intubation, CPR, defibrillation or ACLS   In the event of cardiac or respiratory ARREST Use medication by any route, position, wound care, and other measures to relive pain and suffering. May use oxygen, suction and manual treatment of airway obstruction as needed for comfort.   Comments nurse may pronounce      03/05/18 1338        Code Status History    Date Active Date Inactive Code Status Order ID Comments User Context   02/19/2018 1709 02/26/2018 1634 DNR [id number]  Loletha Grayer, MD ED   12/03/2017 1258 12/06/2017 1847 DNR [id number]  Gladstone Lighter, MD Inpatient   10/24/2017 1844 10/26/2017 1623 DNR [id number]  Vaughan Basta, MD Inpatient   09/23/2015 0018 09/23/2015 1823 DNR [id number]  Lance Coon, MD Inpatient    Advance Directive Documentation     Most Recent Value  Type of Advance Directive  Out of facility DNR (pink MOST or yellow form)  Pre-existing out of facility DNR order (yellow form or pink MOST form)  -  "MOST" Form in Place?  -     Family Communication: Tried to call the  daughter but unable to leave a message on her cell phone. Disposition Plan: Hopefully over the weekend can go to [place] care  Consultants:  Critical care team  Antibiotics:  Rocephin while here probably flipped over the [place] upon getting out of the hospital.  Time spent: 27 minutes  [place]

## 2018-03-13 NOTE — Progress Notes (Addendum)
PALLIATIVE NOTE:  Referral received for goals of care discussion. I have attempted several times today to contact patient's daughter, Kaydin Labo [facility]) with no success.   I left a HIPAA appropriate message on her voicemail with my contact information. Will await return call to set up goals of care meeting. I also left card and message at patient's bedside in case daughter visits prior to contacting me.   Detailed note and recommendations to follow meeting with family.   Thank you for your referral.   Alda Lea, AGPCNP-BC Palliative Medicine Team  Phone: [phone] Fax: [phone] Pager: [phone] Amion: N. [place]

## 2018-03-14 ENCOUNTER — Ambulatory Visit: Payer: Self-pay | Admitting: Family Medicine

## 2018-03-14 DIAGNOSIS — R0602 Shortness of breath: Secondary | ICD-10-CM

## 2018-03-14 DIAGNOSIS — Z7189 Other specified counseling: Secondary | ICD-10-CM

## 2018-03-14 DIAGNOSIS — Z9889 Other specified postprocedural states: Secondary | ICD-10-CM

## 2018-03-14 DIAGNOSIS — Z66 Do not resuscitate: Secondary | ICD-10-CM

## 2018-03-14 DIAGNOSIS — Z515 Encounter for palliative care: Secondary | ICD-10-CM

## 2018-03-14 LAB — GLUCOSE, CAPILLARY
Glucose-Capillary: 50 mg/dL — ABNORMAL LOW (ref 70–99)
Glucose-Capillary: 72 mg/dL (ref 70–99)
Glucose-Capillary: 94 mg/dL (ref 70–99)

## 2018-03-14 LAB — CULTURE, BLOOD (ROUTINE X 2)
CULTURE: NO GROWTH
Culture: NO GROWTH
SPECIAL REQUESTS: ADEQUATE
Special Requests: ADEQUATE

## 2018-03-14 LAB — PROTEIN, BODY FLUID (OTHER): TOTAL PROTEIN, BODY FLUID OTHER: 1.5 g/dL

## 2018-03-14 MED ORDER — PREMIER PROTEIN SHAKE
11.0000 [oz_av] | Freq: Two times a day (BID) | ORAL | Status: DC
  Administered 2018-03-15 – 2018-03-20 (×9): 11 [oz_av] via ORAL

## 2018-03-14 MED ORDER — VITAMIN C 500 MG PO TABS
250.0000 mg | ORAL_TABLET | Freq: Two times a day (BID) | ORAL | Status: DC
  Administered 2018-03-14 – 2018-03-20 (×12): 250 mg via ORAL
  Filled 2018-03-14 (×13): qty 1

## 2018-03-14 MED ORDER — FAMOTIDINE 20 MG PO TABS
20.0000 mg | ORAL_TABLET | Freq: Every day | ORAL | Status: DC
  Administered 2018-03-14 – 2018-03-20 (×7): 20 mg via ORAL
  Filled 2018-03-14 (×7): qty 1

## 2018-03-14 MED ORDER — FUROSEMIDE 10 MG/ML IJ SOLN
40.0000 mg | Freq: Two times a day (BID) | INTRAMUSCULAR | Status: DC
  Administered 2018-03-14 – 2018-03-16 (×4): 40 mg via INTRAVENOUS
  Filled 2018-03-14 (×4): qty 4

## 2018-03-14 MED ORDER — ADULT MULTIVITAMIN W/MINERALS CH
1.0000 | ORAL_TABLET | Freq: Every day | ORAL | Status: DC
  Administered 2018-03-14 – 2018-03-20 (×7): 1 via ORAL
  Filled 2018-03-14 (×7): qty 1

## 2018-03-14 NOTE — Consult Note (Signed)
Consultation Note Date: 03/14/2018   Patient Name: Vicki Morales  DOB: [birth year]  MRN: [id number]  Age / Sex: 77 y.o., female  PCP: Birdie Sons, MD Referring Physician: Dustin Flock, MD  Reason for Consultation: Establishing goals of care  HPI/Patient Profile: 77 y.o. female  admitted on 03/05/2018 from [facility] with complaints of altered mental status and respiratory distress. She has a past medical hisoty of atrial fibrillation, anxiety, depression, arthritis, GERD, hypertension, paranoid disorder, chronic knee pain, and COPD. Patient was recently discharged on 9/22 with COPD exacerbation. She was discharged on her maintenance inhalers, prednisone taper, and empiric Zithromax. She was discharged to [facility] for rehabilitation. Patient was A&O during her breakfast according to SNF staff, around 11 am patient was found to be confused and also short of breath. Staff then requested patient to be sent for evaluation. During her ED course ABG showed elevated PCO2 at 85. She was placed on BiPAP for support. Chest x-ray concerning for right-sided pneumonia. Albumin 2.8. BNP 213.0. WBC 13.7. Since admission she has been treated with IV antibiotic for sepsis and have been able to wean off BiPAP to 2L/[place]. She is being followed by pulmonology and is s/p thoracentesis on 10/7 yielding 723m of amber colored fluid. Palliative Medicine team consulted for goals of care discussion.   Clinical Assessment and Goals of Care: I have reviewed medical records including lab results, imaging, Epic notes, and MAR, received report from the bedside RN, and assessed the patient. I then met at the bedside with patient and her daughter, CLyndon Chenowethto discuss diagnosis prognosis, [place] wishes, disposition and options. Patient is A&O x4. She is short of breath and reports her legs are weak. She is able to  engage in goals of care conversation with her daughter and I.   I introduced Palliative Medicine as specialized medical care for people living with serious illness. It focuses on providing relief from the symptoms and stress of a serious illness. The goal is to improve quality of life for both the patient and the family.  We discussed a brief life review of the patient. 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 She has lived with her daughter Vicki Morales. She reports she loves crocheting and playing Soduko puzzles.   As far as functional and nutritional status patient and daughter reports a decline in patient's health over the past 6 months. She continues to complain of shortness of breath with minimum exertion. Patient reports over the past month she has increasing fatigue and weakness. [facility] have been able to work with PT daily up until the day before admission. She was ambulating with walker. She reports she generally has a good appetite, unless she is sick and then does not have much of one. She requires assistance with ADLs due to shortness of breath and weakness.  Prior to SNF placement daughter reports she was able to perform most ADLs independently. She reports patient does suffer from depression and paranoid and sometimes would be closed in.   We discussed her current illness and what it means in the larger context of her  on-going co-morbidities.  Natural disease trajectory and expectations at EOL were discussed.  Patient and daughter verbalized understanding of her current condition. They remain hopeful for improvement and for the best. Daughter and patient both express that their hopes is for her to return to SNF and continue with rehab to allow her chance to improve. They are accepting that she may not return to baseline and that she could potentially  require continuous oxygen and worsening COPD.   I attempted to elicit values and goals of care important to the patient and her daughter.    The difference between aggressive medical intervention and comfort care was considered in light of the patient's goals of care. At this time patient and daughter verbalized they would like to continue to treat the treatable. The remain hopeful for improvement but is also prepared for the worst.   Advanced directives, concepts specific to code status, artifical feeding and hydration, and rehospitalization were considered and discussed. Ms. Holstine and her daughter confirmed wishes to continue with DNR/DNI and no artificial feeding if faced with this decision, such as PEG tube.   Hospice and Palliative Care services outpatient were explained and offered. Given their goals of continuing to treat the treatable and rehabilitation, patient and family educated that Palliative care services would be most appropriate. Both verbalized understanding and request to have outpatient palliative at discharge. They are also aware and verbalize understanding that in the future they feel that hospice is most appropriate they can discuss with their outpatient palliative team and they can assist with transitioning to hospice.   Questions and concerns were addressed.  The family was encouraged to call with questions or concerns.  PMT will continue to support holistically.  Primary Decision Maker: Patient is A&O x3. Capable of making decisions, however in the event she cannot. She verbalizes her daughter [name] is her HCPOA.    SUMMARY OF RECOMMENDATIONS    DNR/DNI-as confirmed by patient/daughter  Continue to treat the treatable. Patient and family remains hopeful for improvement.   Patient/Daughter expresses wishes for patient to return to [facility] care for rehabilitation with outpatient Palliative.   CSW referral for outpatient palliative at discharge.   PMT will  continue to support patient, family, and medical team.    Code Status/Advance Care Planning:  DNR  Palliative Prophylaxis:   Aspiration, Bowel Regimen, Frequent Pain Assessment and Turn Reposition  Additional Recommendations (Limitations, Scope, Preferences):  Full Scope Treatment-continue to treat the treatable.   Psycho-social/Spiritual:   Desire for further Chaplaincy support:NO   Prognosis:   Unable to determine-Guarded to poor in the setting of hypertension, GERD, COPD, anxiety, depression, acute on chronic respiratory failure with hypercapnia, asthma, sepsis, decreased mobility, a-fib, generalized weakness, and anasarca.  Discharge Planning: [place] [facility] for rehab with Palliative care service follow-up      Primary Diagnoses: Present on Admission: . Acute and chronic respiratory failure with hypercapnia ([place])   I have reviewed the medical record, interviewed the patient and family, and examined the patient. The following aspects are pertinent.  Past Medical History:  Diagnosis Date  . Anxiety   . Arthritis   . Cataract    right eye  . COPD (chronic obstructive pulmonary disease) ([place])   .  Depression   . Dyspnea    DOE  . Dysrhythmia   . Edema    FEET/ANKLES  . GERD (gastroesophageal reflux disease)   . H/O wheezing   . History of hiatal hernia   . HOH (hard of hearing)   . Hypertension   . Incontinence of urine in female   . Orthopnea   . Pain    CHRONIC KNEE  . Pain    CHRONIC KNEE  . Paranoid disorder ([place])   . RLS (restless legs syndrome)   . Tremors of nervous system    HANDS   Social History   Socioeconomic History  . Marital status: Widowed    Spouse name: Not on file  . Number of children: 2  . Morales of education: Not on file  . Highest education level: 12th grade  Occupational History  . 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 Financial resource strain: Not hard at all  . Food insecurity:    Worry: Never  true    Inability: Never true  . Transportation needs:    Medical: No    Non-medical: No  Tobacco Use  . Smoking status: Former Smoker    Morales: 30.00  . Smokeless tobacco: Never Used  . Tobacco comment: quit in 1989  Substance and Sexual Activity  . Alcohol use: No    Alcohol/week: 0.0 standard drinks  . Drug use: No  . Sexual activity: Not Currently  Lifestyle  . Physical activity:    Days per week: 0 days    Minutes per session: 0 min  . Stress: Only a little  Relationships  . Social connections:    Talks on phone: Not on file    Gets together: Not on file    Attends religious service: Not on file    Active member of club or organization: Not on file    Attends meetings of clubs or organizations: Not on file    Relationship status: Not on file  Other Topics Concern  . Not on file  Social History Narrative   Lives at home with her daughter.  Ambulates with a walker at baseline   Family History  Problem Relation Age of Onset  . Breast cancer Sister   . Stroke Father   . Emphysema Father   . Anxiety disorder Father   . Depression Father   . Anxiety disorder Brother    Scheduled Meds: . albuterol  2.5 mg Nebulization TID  . amiodarone  200 mg Oral BID  . apixaban  5 mg Oral BID  . aspirin EC  81 mg Oral Daily  . budesonide (PULMICORT) nebulizer solution  0.5 mg Nebulization BID  . busPIRone  10 mg Oral TID  . digoxin  0.0625 mg Oral Daily  . diltiazem  120 mg Oral Daily  . divalproex  250 mg Oral Q12H  . fluticasone  1 spray Each Nare BID  . furosemide  40 mg Intravenous Q12H  . gabapentin  100 mg Oral BID WC  . gabapentin  600 mg Oral QHS  . loperamide  4 mg Oral Q6H  . Melatonin  10 mg Oral QHS  . methylPREDNISolone (SOLU-MEDROL) injection  40 mg Intravenous Daily  . midodrine  5 mg Oral TID WC  . montelukast  10 mg Oral QHS  . multivitamin with minerals  1 tablet Oral Daily  . protein supplement shake  11 oz Oral BID BM  . risperiDONE  1 mg Oral QHS    .  theophylline  200 mg Oral Daily  . vitamin C  250 mg Oral BID   Continuous Infusions: . cefTRIAXone (ROCEPHIN)  IV Stopped (03/14/18 1302)   PRN Meds:.ALPRAZolam, alum & mag hydroxide-simeth, guaiFENesin-dextromethorphan, hydrALAZINE, ipratropium-albuterol, menthol-cetylpyridinium, ondansetron **OR** ondansetron (ZOFRAN) IV, traZODone Medications Prior to Admission:  Prior to Admission medications   Medication Sig Start Date End Date Taking? Authorizing Provider  acidophilus (RISAQUAD) CAPS capsule Take 1 capsule by mouth 3 (three) times daily with meals. 02/24/18  Yes Salary, Montell D, MD  albuterol (PROVENTIL) (2.5 MG/3ML) 0.083% nebulizer solution Take 3 mLs (2.5 mg total) by nebulization 2 (two) times daily as needed for wheezing or shortness of breath. 09/22/16  Yes Birdie Sons, MD  amiodarone (PACERONE) 200 MG tablet Take 1 tablet (200 mg total) by mouth 2 (two) times daily. 02/24/18  Yes Salary, Avel Peace, MD  apixaban (ELIQUIS) 5 MG TABS tablet Take 1 tablet (5 mg total) by mouth 2 (two) times daily. 02/24/18  Yes Salary, Avel Peace, MD  aspirin EC 81 MG EC tablet Take 1 tablet (81 mg total) by mouth daily. 02/25/18  Yes Salary, Avel Peace, MD  busPIRone (BUSPAR) 10 MG tablet Take 1 tablet (10 mg total) by mouth 3 (three) times daily. 12/09/17  Yes Ursula Alert, MD  Calcium Carbonate-Vitamin D (CALCIUM 600+D) 600-400 MG-UNIT tablet Take 1 tablet by mouth 2 (two) times daily.    Yes [provider]  Cranberry 250 MG CAPS Take 1 capsule by mouth daily.    Yes [provider]  cyanocobalamin 1000 MCG tablet Take 1,000 mcg by mouth daily.   Yes [provider]  digoxin (LANOXIN) 0.125 MG tablet Take 0.5 tablets (0.0625 mg total) by mouth daily. 02/24/18  Yes Salary, Avel Peace, MD  divalproex (DEPAKOTE ER) 250 MG 24 hr tablet Take 250 mg by mouth 2 (two) times daily.   Yes [provider]  feeding supplement, ENSURE ENLIVE, (ENSURE ENLIVE) LIQD Take 237  mLs by mouth 2 (two) times daily between meals. 02/24/18  Yes Salary, Avel Peace, MD  fluticasone (FLONASE) 50 MCG/ACT nasal spray Place 1 spray into both nostrils daily.   Yes [provider]  Fluticasone-Umeclidin-Vilant (TRELEGY ELLIPTA) 100-62.5-25 MCG/INH AEPB Inhale 1 puff into the lungs daily. 11/08/17  Yes Birdie Sons, MD  gabapentin (NEURONTIN) 100 MG capsule TAKE 1 CAPSULE(100 MG) BY MOUTH TWICE DAILY Patient taking differently: Take 100 mg by mouth 2 (two) times daily.  04/26/17  Yes Birdie Sons, MD  gabapentin (NEURONTIN) 300 MG capsule TAKE 2 CAPSULES(600 MG) BY MOUTH AT BEDTIME Patient taking differently: Take 600 mg by mouth at bedtime.  04/26/17  Yes Birdie Sons, MD  guaiFENesin (MUCINEX) 600 MG 12 hr tablet Take 1 tablet (600 mg total) by mouth 2 (two) times daily. 02/24/18  Yes Salary, Avel Peace, MD  loperamide (IMODIUM) 2 MG capsule Take 1 capsule (2 mg total) by mouth as needed for diarrhea or loose stools. 02/24/18  Yes Salary, Avel Peace, MD  loratadine (CLARITIN) 10 MG tablet Take 10 mg by mouth daily.   Yes [provider]  magnesium oxide (MAG-OX) 400 MG tablet Take 400 mg by mouth 2 (two) times daily.   Yes [provider]  Melatonin 10 MG CAPS Take 10 mg by mouth at bedtime as needed (sleep).    Yes [provider]  montelukast (SINGULAIR) 10 MG tablet TAKE 1 TABLET(10 MG) BY MOUTH EVERY NIGHT Patient taking differently: Take 10 mg by mouth  at bedtime.  01/29/18  Yes Birdie Sons, MD  Multiple Vitamin (MULTIVITAMIN WITH MINERALS) TABS tablet Take 1 tablet by mouth daily.   Yes [provider]  Omega-3 Fatty Acids (FISH OIL) 1000 MG CAPS Take 1,000 mg by mouth daily.    Yes [provider]  oxybutynin (DITROPAN-XL) 10 MG 24 hr tablet TAKE 1 TABLET BY MOUTH AT BEDTIME Patient taking differently: Take 10 mg by mouth at bedtime.  08/21/17  Yes Birdie Sons, MD  patiromer (VELTASSA) 8.4 g packet Take 1 packet  (8.4 g total) by mouth daily. 02/24/18  Yes Salary, Avel Peace, MD  predniSONE (STERAPRED UNI-PAK 21 TAB) 10 MG (21) TBPK tablet Take as directed 02/24/18  Yes Salary, Montell D, MD  risperiDONE (RISPERDAL) 1 MG tablet Take 1 tablet (1 mg total) by mouth at bedtime. 12/09/17  Yes Ursula Alert, MD  Saccharomyces boulardii (PROBIOTIC) 250 MG CAPS Take 1 capsule by mouth 3 times/day as needed-between meals & bedtime. Patient taking differently: Take 1 capsule by mouth 4 (four) times daily -  with meals and at bedtime.  02/09/18  Yes Merlyn Lot, MD  theophylline (THEO-24) 200 MG 24 hr capsule Take 1 capsule (200 mg total) by mouth daily. 12/07/17  Yes Birdie Sons, MD  tiZANidine (ZANAFLEX) 4 MG tablet Take 1 tablet (4 mg total) by mouth every 8 (eight) hours as needed for muscle spasms. 06/17/17  Yes Birdie Sons, MD  traMADol (ULTRAM) 50 MG tablet Take 1 tablet (50 mg total) by mouth 2 (two) times daily as needed for moderate pain. 02/24/18  Yes Salary, Avel Peace, MD  traZODone (DESYREL) 100 MG tablet Take 1-2 tablets (100-200 mg total) by mouth at bedtime. Patient taking differently: Take 100 mg by mouth at bedtime.  02/07/18  Yes Ursula Alert, MD  azithromycin (ZITHROMAX) 250 MG tablet 1 po daily Patient not taking: Reported on 03/05/2018 02/25/18   Salary, Avel Peace, MD  diltiazem (CARDIZEM CD) 120 MG 24 hr capsule Take 1 capsule (120 mg total) by mouth daily. Patient not taking: Reported on 03/05/2018 02/26/18   Henreitta Leber, MD  divalproex (DEPAKOTE) 250 MG DR tablet Take 1 tablet (250 mg total) by mouth 2 (two) times daily. Patient not taking: Reported on 03/13/2018 12/09/17   Ursula Alert, MD   Allergies  Allergen Reactions  . Arthrotec [Diclofenac-Misoprostol] Hives  . Codeine Other (See Comments)    Headache   . Diclofenac Other (See Comments) and Hives    Pt unsure allergy  . Hydrochlorothiazide Other (See Comments)    Weakness   . Penicillins Hives    Has patient had a PCN  reaction causing immediate rash, facial/tongue/throat swelling, SOB or lightheadedness with hypotension: No Has patient had a PCN reaction causing severe rash involving mucus membranes or skin necrosis: No Has patient had a PCN reaction that required hospitalization: No Has patient had a PCN reaction occurring within the last 10 Morales: No If all of the above answers are "NO", then may proceed with Cephalosporin use.  . Sulfa Antibiotics Hives  . Tiotropium Bromide Monohydrate Other (See Comments)    Blurry vision   . Tylenol [Acetaminophen] Other (See Comments)    Doesn't work  . Morphine Hives and Rash  . Pantoprazole Rash   Review of Systems  Constitutional: Positive for activity change, appetite change and fatigue.  Respiratory: Positive for shortness of breath and wheezing.   Cardiovascular: Positive for leg swelling.  Neurological: Positive for weakness.  All other systems reviewed and are negative.   Physical Exam  Constitutional: She is oriented to person, place, and time. Vital signs are normal. She is cooperative. She appears ill.  Obese, critically ill   Cardiovascular: Normal heart sounds and normal pulses. Tachycardia present.  Bilateral lower extremity edema, bilateral upper extremity edema   Pulmonary/Chest: Accessory muscle usage present. Tachypnea noted. She has decreased breath sounds. She has wheezes. She has rhonchi.  Congested cough   Abdominal:  Obese   Musculoskeletal:  Generalized weakness   Neurological: She is alert and oriented to person, place, and time.  Skin: Skin is warm and dry. Bruising noted.  Psychiatric: Her speech is normal. Judgment and thought content normal. Her mood appears anxious. Cognition and memory are normal.  Hx of depression, anxiety   Nursing note and vitals reviewed.   Vital Signs: BP 106/73 (BP Location: Left Leg)   Pulse 87   Temp 98.1 F (36.7 C) (Oral)   Resp 18   Ht '5\' 4"'  (1.626 m)   Wt 122.4 kg   SpO2 97%   BMI  46.33 kg/m  Pain Scale: 0-10 POSS *See Group Information*: S-Acceptable,Sleep, easy to arouse Pain Score: 0-No pain   SpO2: SpO2: 97 % O2 Device:SpO2: 97 % O2 Flow Rate: .O2 Flow Rate (L/min): 3 L/min  IO: Intake/output summary: No intake or output data in the 24 hours ending 03/14/18 1642  LBM: Last BM Date: 03/13/18 Baseline Weight: Weight: 126.1 kg Most recent weight: Weight: 122.4 kg     Palliative Assessment/Data: PPS 40%   Time In: 1300 Time Out: 1415 Time Total: 75 min.   Greater than 50%  of this time was spent counseling and coordinating care related to the above assessment and plan.  Signed by:  Alda Lea, AGPCNP-BC Palliative Medicine Team  Phone: [phone] Fax: [phone] Pager: [phone] Amion: Bjorn Pippin    Please contact Palliative Medicine Team phone at [phone] for questions and concerns.  For individual provider: See Shea Evans

## 2018-03-14 NOTE — Progress Notes (Signed)
Initial Nutrition Assessment  DOCUMENTATION CODES:   Morbid obesity  INTERVENTION:   Premier Protein BID, each supplement provides 160 kcal and 30 grams of protein.   MVI daily  Vitamin C 272m po BID  NUTRITION DIAGNOSIS:   Increased nutrient needs related to catabolic illness(COPD, PNA ) as evidenced by increased estimated needs.  GOAL:   Patient will meet greater than or equal to 90% of their needs  MONITOR:   PO intake, Supplement acceptance, Labs, Weight trends, Skin, I & O's  REASON FOR ASSESSMENT:   Consult Assessment of nutrition requirement/status  ASSESSMENT:   77y/o female with h/o COPD, recent treatment for diverticulitis and diarrhea, new onset atrial fibrillation, essential hypertension, restless leg syndrome, anxiety and depression, morbid obesity admitted for PNA and bacteremia    Pt s/o thoracentesis 10/7 with 7555moutput   Met with pt in room today. Pt familiar to this RD from previous admits. Pt with good appetite and oral intake at baseline; pt eating 50-100% of meals in hospital. Pt does like vanilla supplements; pt usually asks for Ensure but would like to try Premier Protein this admit. Per chart, pt is weight stable. RD will add supplements and MVI to help pt meet her estimated needs.   Medications reviewed and include: aspirin, lasix, imodium, melatonin, solu-medrol, ceftriaxone  Labs reviewed: wbc- 15.8(H)- 10/6  NUTRITION - FOCUSED PHYSICAL EXAM:    Most Recent Value  Orbital Region  No depletion  Upper Arm Region  No depletion  Thoracic and Lumbar Region  No depletion  Buccal Region  No depletion  Temple Region  No depletion  Clavicle Bone Region  No depletion  Clavicle and Acromion Bone Region  No depletion  Scapular Bone Region  No depletion  Dorsal Hand  No depletion  Patellar Region  No depletion  Anterior Thigh Region  No depletion  Posterior Calf Region  No depletion  Edema (RD Assessment)  Moderate  Hair  Reviewed  Eyes   Reviewed  Mouth  Reviewed  Skin  Reviewed [[email or web address]  Nails  Reviewed     Diet Order:   Diet Order            DIET DYS 3 Room service appropriate? Yes with Assist; Fluid consistency: Thin  Diet effective now             EDUCATION NEEDS:   Education needs have been addressed  Skin:  Skin Assessment: Reviewed RN Assessment(ecchymosis )  Last BM:  10/8- type 7  Height:   Ht Readings from Last 1 Encounters:  03/05/18 '5\' 4"'  (1.626 m)    Weight:   Wt Readings from Last 1 Encounters:  03/14/18 122.4 kg    Ideal Body Weight:  54.5 kg  BMI:  Body mass index is 46.33 kg/m.  Estimated Nutritional Needs:   Kcal:  1800-2100kcal/day   Protein:  118-130g/day   Fluid:  >1.6L/day   CaKoleen DistanceS, RD, LDN Pager #- [phone]#- [phone] Hours Pager: [phone]

## 2018-03-14 NOTE — Progress Notes (Signed)
New referral for outpatient PALLIATIVE to follow at [facility] received from [place]. Patient information faxed to referral. Flo Shanks RN, BSN, [facility] [place], hospital Liaison [phone]

## 2018-03-14 NOTE — Progress Notes (Signed)
Patient ID: Vicki Morales, female   DOB: 1940-12-25, 78 y.o.   MRN: [id number]   [facility] PROGRESS NOTE  Vicki Morales NAT:[id number] DOB: 10-25-1940 DOA: 03/05/2018 PCP: Birdie Sons, MD  HPI/Subjective: She reports diarrhea is improved however continues to have significant swelling Objective: Vitals:   03/13/18 2001 03/14/18 0448  BP:  (!) 142/78  Pulse:  (!) 107  Resp:    Temp:  (!) 97.5 F (36.4 C)  SpO2: 96% 96%    Filed Weights   03/11/18 0336 03/13/18 0426 03/14/18 0448  Weight: 122.2 kg 122.2 kg 122.4 kg    ROS: Review of Systems  Unable to perform ROS: Acuity of condition  Constitutional: Positive for malaise/fatigue. Negative for chills and fever.  Eyes: Negative for blurred vision.  Respiratory: Positive for cough and shortness of breath.   Cardiovascular: Negative for chest pain.  Gastrointestinal: Positive for diarrhea. Negative for abdominal pain, constipation, nausea and vomiting.  Genitourinary: Negative for dysuria.  Musculoskeletal: Negative for joint pain.  Neurological: Negative for dizziness and headaches.   Exam: Physical Exam  HENT:  Nose: No mucosal edema.  Mouth/Throat: No oropharyngeal exudate or posterior oropharyngeal edema.  Eyes: Pupils are equal, round, and reactive to light. Conjunctivae, EOM and lids are normal.  Neck: No JVD present. Carotid bruit is not present. No edema present. No thyroid mass and no thyromegaly present.  Cardiovascular: S1 normal and S2 normal. Exam reveals no gallop.  No murmur heard. Pulses:      Dorsalis pedis pulses are 2+ on the right side, and 2+ on the left side.  Respiratory: No respiratory distress. She has decreased breath sounds in the right lower field and the left lower field. She has wheezes in the right lower field and the left lower field. She has no rhonchi. She has no rales.  GI: Soft. Bowel sounds are normal. There is no tenderness.  Musculoskeletal: She exhibits edema.       Right  ankle: She exhibits swelling.       Left ankle: She exhibits swelling.  Lymphadenopathy:    She has no cervical adenopathy.  Neurological: She is alert. No cranial nerve deficit.  Skin: Skin is warm. No rash noted. Nails show no clubbing.  Psychiatric: She has a normal mood and affect.      Data Reviewed: Basic Metabolic Panel: Recent Labs  Lab 03/08/18 0518 03/09/18 0349 03/11/18 0635 03/12/18 0446 03/13/18 0544  NA 135 137 139 137 133*  K 4.7 4.4 5.5* 4.5 4.9  CL 94* 94* 95* 94* 92*  CO2 33* 34* 37* 36* 37*  GLUCOSE 121* 121* 110* 97 112*  BUN 40* 41* 26* 18 22  CREATININE 0.89 0.92 0.69 0.73 0.74  CALCIUM 8.8* 8.8* 8.8* 8.5* 8.1*   Liver Function Tests: No results for input(s): AST, ALT, ALKPHOS, BILITOT, PROT, ALBUMIN in the last 168 hours. CBC: Recent Labs  Lab 03/08/18 0518 03/09/18 0349 03/11/18 0453 03/12/18 0446  WBC 4.7 5.6 11.9* 15.8*  HGB 12.1 12.4 13.1 12.4  HCT 34.8* 36.5 39.1 38.1  MCV 92.4 92.4 91.5 91.3  PLT 154 166 171 168   Cardiac Enzymes: No results for input(s): CKTOTAL, CKMB, CKMBINDEX, TROPONINI in the last 168 hours. BNP (last 3 results) Recent Labs    03/05/18 1124  BNP 213.0*    CBG: Recent Labs  Lab 03/13/18 1712 03/13/18 2127 03/14/18 0739 03/14/18 0759 03/14/18 1224  GLUCAP 76 144* 50* 72 94    Recent Results (from  the past 240 hour(s))  Culture, blood (routine x 2)     Status: Abnormal   Collection Time: 03/05/18 12:03 PM  Result Value Ref Range Status   Specimen Description   Final    BLOOD RIGHT EJ Performed at [facility], [address] [place], [place] [address]    Special Requests   Final    BOTTLES DRAWN AEROBIC AND ANAEROBIC Blood Culture adequate volume Performed at [facility], [address], [place], [place] [address]    Culture  Setup Time   Final    GRAM POSITIVE COCCI IN BOTH AEROBIC AND ANAEROBIC BOTTLES CRITICAL RESULT CALLED TO, READ BACK BY AND VERIFIED WITH: [place] 03/06/18.PMH Performed at [facility], [address] [place], [place] [address]    Culture (A)  Final    STREPTOCOCCUS PNEUMONIAE STAPHYLOCOCCUS HOMINIS SUSCEPTIBILITIES PERFORMED ON PREVIOUS CULTURE WITHIN THE LAST 5 DAYS. Performed at [facility], [place] [address], [place], [place] [address]    Report Status 03/10/2018 FINAL  Final   Organism ID, Bacteria STREPTOCOCCUS PNEUMONIAE  Final      Susceptibility   Streptococcus pneumoniae - MIC*    ERYTHROMYCIN >=8 RESISTANT Resistant     LEVOFLOXACIN 0.5 SENSITIVE Sensitive     PENICILLIN (non-meningitis) <=0.06 SENSITIVE Sensitive     PENICILLIN (oral) <=0.06 SENSITIVE Sensitive     CEFTRIAXONE (non-meningitis) <=0.12 SENSITIVE Sensitive     * STREPTOCOCCUS PNEUMONIAE  Culture, blood (routine x 2)     Status: Abnormal   Collection Time: 03/05/18 12:23 PM  Result Value Ref Range Status   Specimen Description   Final    BLOOD RIGHT EJ Performed at [facility], [address] [place], [place] [address]    Special Requests   Final    BOTTLES DRAWN AEROBIC AND ANAEROBIC Blood Culture adequate volume Performed at [facility], [address] [place], [place] [address]    Culture  Setup Time   Final    GRAM POSITIVE COCCI IN BOTH AEROBIC AND ANAEROBIC BOTTLES CRITICAL RESULT CALLED TO, READ BACK BY AND VERIFIED WITH: [place] 03/06/18.PMH Performed at [facility], [address] [place], [place] [address]    Culture (A)  Final    STREPTOCOCCUS PNEUMONIAE SUSCEPTIBILITIES PERFORMED ON PREVIOUS CULTURE WITHIN THE LAST 5 DAYS. STAPHYLOCOCCUS HOMINIS CRITICAL RESULT CALLED TO, READ BACK BY AND VERIFIED WITH: PHARM D Amelia K [place] [id number] FCP Performed at [facility], [place] [address], [place], [place] [address]    Report Status 03/10/2018 FINAL  Final   Organism ID, Bacteria STAPHYLOCOCCUS HOMINIS  Final      Susceptibility   Staphylococcus hominis  - MIC*    CIPROFLOXACIN >=8 RESISTANT Resistant     ERYTHROMYCIN >=8 RESISTANT Resistant     GENTAMICIN 1 SENSITIVE Sensitive     OXACILLIN 1 RESISTANT Resistant     TETRACYCLINE >=16 RESISTANT Resistant     VANCOMYCIN 1 SENSITIVE Sensitive     TRIMETH/SULFA 40 SENSITIVE Sensitive     CLINDAMYCIN >=8 RESISTANT Resistant     RIFAMPIN <=0.5 SENSITIVE Sensitive     Inducible Clindamycin NEGATIVE Sensitive     * STAPHYLOCOCCUS HOMINIS  Blood Culture ID Panel (Reflexed)     Status: Abnormal   Collection Time: 03/05/18 12:23 PM  Result Value Ref Range Status   Enterococcus species NOT DETECTED NOT DETECTED Final   Listeria monocytogenes NOT DETECTED NOT DETECTED Final   Staphylococcus  species NOT DETECTED NOT DETECTED Final   Staphylococcus aureus (BCID) NOT DETECTED NOT DETECTED Final   Streptococcus species DETECTED (A) NOT DETECTED Final    Comment: CRITICAL RESULT CALLED TO, READ BACK BY AND VERIFIED WITH: DAVID BESANTI AT 0327 03/06/18.PMH    Streptococcus agalactiae NOT DETECTED NOT DETECTED Final   Streptococcus pneumoniae DETECTED (A) NOT DETECTED Final    Comment: CRITICAL RESULT CALLED TO, READ BACK BY AND VERIFIED WITH: DAVID BESANTI AT 0327 03/06/18.PMH    Streptococcus pyogenes NOT DETECTED NOT DETECTED Final   Acinetobacter baumannii NOT DETECTED NOT DETECTED Final   Enterobacteriaceae species NOT DETECTED NOT DETECTED Final   Enterobacter cloacae complex NOT DETECTED NOT DETECTED Final   Escherichia coli NOT DETECTED NOT DETECTED Final   Klebsiella oxytoca NOT DETECTED NOT DETECTED Final   Klebsiella pneumoniae NOT DETECTED NOT DETECTED Final   Proteus species NOT DETECTED NOT DETECTED Final   Serratia marcescens NOT DETECTED NOT DETECTED Final   Haemophilus influenzae NOT DETECTED NOT DETECTED Final   Neisseria meningitidis NOT DETECTED NOT DETECTED Final   Pseudomonas aeruginosa NOT DETECTED NOT DETECTED Final   Candida albicans NOT DETECTED NOT DETECTED Final    Candida glabrata NOT DETECTED NOT DETECTED Final   Candida krusei NOT DETECTED NOT DETECTED Final   Candida parapsilosis NOT DETECTED NOT DETECTED Final   Candida tropicalis NOT DETECTED NOT DETECTED Final    Comment: Performed at [facility], [address] [place], [place] [address]  MRSA PCR Screening     Status: None   Collection Time: 03/05/18  4:50 PM  Result Value Ref Range Status   MRSA by PCR NEGATIVE NEGATIVE Final    Comment:        The GeneXpert MRSA Assay (FDA approved for NASAL specimens only), is one component of a comprehensive MRSA colonization surveillance program. It is not intended to diagnose MRSA infection nor to guide or monitor treatment for MRSA infections. Performed at [facility], [address] [place], [place] [address]   CULTURE, BLOOD (ROUTINE X 2) w Reflex to ID Panel     Status: None   Collection Time: 03/09/18  2:23 PM  Result Value Ref Range Status   Specimen Description BLOOD BLOOD LEFT HAND  Final   Special Requests   Final    BOTTLES DRAWN AEROBIC AND ANAEROBIC Blood Culture adequate volume   Culture   Final    NO GROWTH 5 DAYS Performed at [facility], [address] [place], [place] [address]    Report Status 03/14/2018 FINAL  Final  CULTURE, BLOOD (ROUTINE X 2) w Reflex to ID Panel     Status: None   Collection Time: 03/09/18  2:34 PM  Result Value Ref Range Status   Specimen Description BLOOD BLOOD RIGHT HAND  Final   Special Requests   Final    BOTTLES DRAWN AEROBIC AND ANAEROBIC Blood Culture adequate volume   Culture   Final    NO GROWTH 5 DAYS Performed at [facility], [address], [place], [place] [address]    Report Status 03/14/2018 FINAL  Final  Body fluid culture     Status: None (Preliminary result)   Collection Time: 03/13/18  4:56 PM  Result Value Ref Range Status   Specimen Description   Final    PLEURAL Performed at [facility], [address],  [place], [place] [address]    Special Requests   Final    NONE Performed at [facility], [place], [place] [address]    Gram Stain   Final    FEW WBC PRESENT, PREDOMINANTLY MONONUCLEAR NO ORGANISMS SEEN    Culture   Final    NO GROWTH < 24 HOURS Performed at [facility] [address] [place], [place] [address]    Report Status PENDING  Incomplete      Scheduled Meds: . albuterol  2.5 mg Nebulization TID  . amiodarone  200 mg Oral BID  . apixaban  5 mg Oral BID  . aspirin EC  81 mg Oral Daily  . budesonide (PULMICORT) nebulizer solution  0.5 mg Nebulization BID  . busPIRone  10 mg Oral TID  . digoxin  0.0625 mg Oral Daily  . diltiazem  120 mg Oral Daily  . divalproex  250 mg Oral Q12H  . fluticasone  1 spray Each Nare BID  . furosemide  40 mg Intravenous Q12H  . gabapentin  100 mg Oral BID WC  . gabapentin  600 mg Oral QHS  . loperamide  4 mg Oral Q6H  . Melatonin  10 mg Oral QHS  . methylPREDNISolone (SOLU-MEDROL) injection  40 mg Intravenous Daily  . midodrine  5 mg Oral TID WC  . montelukast  10 mg Oral QHS  . risperiDONE  1 mg Oral QHS  . theophylline  200 mg Oral Daily   Continuous Infusions: . cefTRIAXone (ROCEPHIN)  IV Stopped (03/14/18 1302)    Assessment/Plan:  1. Sepsis with Streptococcus pneumonia, Staph Hominus is likely a contamination.. Change to oral antibiotics 2. Acute respiratory failure with hypoxia and hypercapnia.  Patient required BiPAP in presentation.   Slowly oxygen requirement is improving continue current therapy 3. COPD exacerbation with poor air entry.  Continue daily Solu-Medrol and nebulizer treatments. 4. Anasarca I will increase her Lasix blood pressure stable 5. paroxysmal atrial fibrillation on amiodarone and Eliquis and digoxin and Cardizem 6. Anxiety depression and restless leg syndrome.  Continue psychiatric medication for sleep we will add Ambien 7. Morbid obesity with a BMI of 46.43 8. Weakness.   Appreciate physical therapy consultation.  Patient interested in rehab. 9. Diarrhea.  Change Imodium again to scheduled   Code Status:     Code Status Orders  (From admission, onward)         Start     Ordered   03/05/18 1337  Do not attempt resuscitation (DNR)  Continuous    Question Answer Comment  In the event of cardiac or respiratory ARREST Do not call a "code blue"   In the event of cardiac or respiratory ARREST Do not perform Intubation, CPR, defibrillation or ACLS   In the event of cardiac or respiratory ARREST Use medication by any route, position, wound care, and other measures to relive pain and suffering. May use oxygen, suction and manual treatment of airway obstruction as needed for comfort.   Comments nurse may pronounce      03/05/18 1338        Code Status History    Date Active Date Inactive Code Status Order ID Comments User Context   02/19/2018 1709 02/26/2018 1634 DNR [id number]  Loletha Grayer, MD ED   12/03/2017 1258 12/06/2017 1847 DNR [id number]  Gladstone Lighter, MD Inpatient   10/24/2017 1844 10/26/2017 1623 DNR [id number]  Vaughan Basta, MD Inpatient   09/23/2015 0018 09/23/2015 1823 DNR [id number]  Lance Coon, MD Inpatient    Advance Directive Documentation     Most Recent Value  Type of Advance Directive  Out of facility DNR (pink MOST or yellow form)  Pre-existing out of facility DNR order (yellow form or pink MOST form)  -  "MOST" Form in Place?  -     Family Communication: Tried to call the daughter but unable to leave a message on her cell phone. Disposition Plan: Hopefully over the weekend can go to [facility]  Consultants:  Critical care team  Antibiotics:  Rocephin while here probably flipped over the [place] upon getting out of the hospital.  Time spent: 27 minutes  [place]

## 2018-03-14 NOTE — Progress Notes (Signed)
Hypoglycemic Event  CBG: 50  Treatment: 4 oz orange juice  Follow-up CBG Result: 72  Vicki Morales S

## 2018-03-14 NOTE — Progress Notes (Signed)
Physical Therapy Treatment Patient Details Name: Vicki Morales MRN: [id number] DOB: [birth year] Today's Date: 03/14/2018    History of Present Illness Pt presented to ER on 03/05/2018 with c/c of altered mental status and was admitted for acute and chronic respiratory failure with hypercapnia. Pt is positive for strep pnuemonia bactermia, R sided pnuemonia. Pt was off and on BiPAP on 9/29-10/1 now on 4L via nasal cannula. PMH includes COPD, chronic A-fib, GERD, HTN, orthopnea, anxiety and depression, chronic knee pain, and RLS.    PT Comments    Pt awake but reports general fatigue from using bed pan "all night long."  She was motivated to participate stating "I'll try."  She was able to transition to edge of bed with rail and ease.  Once sitting, she had no LOB's for while sitting x 7 minutes.  Participated in exercises as described below.  She attempted with max effort to lateral scoot to right in sitting to reposition better in bed.  She was unable to effectively move along the edge of bed in sitting and fatigue increased from effort.  She was unable to sit longer and wait for assist for standing trials.  She returned to supine and needed mod a x 1 for LE management.  She was able to scoot up in bed in supine with HOB lowered and assist to brace feet in hook lying position.   Requested bedpan and was assisted with nurse tech.   Follow Up Recommendations  SNF     Equipment Recommendations  None recommended by PT    Recommendations for Other Services       Precautions / Restrictions Precautions Precautions: Fall Restrictions Weight Bearing Restrictions: No    Mobility  Bed Mobility Overal bed mobility: Needs Assistance Bed Mobility: Supine to Sit;Sit to Supine;Rolling Rolling: Min assist   Supine to sit: Modified independent (Device/Increase time) Sit to supine: Min assist;Mod assist      Transfers                 General transfer comment: Unable to assess d/t  fatigue.  Ambulation/Gait                 Stairs             Wheelchair Mobility    Modified Rankin (Stroke Patients Only)       Balance Overall balance assessment: Needs assistance Sitting-balance support: Feet supported Sitting balance-Leahy Scale: Good       Standing balance-Leahy Scale: Zero Standing balance comment: unable to stand                            Cognition Arousal/Alertness: Awake/alert Behavior During Therapy: WFL for tasks assessed/performed Overall Cognitive Status: Within Functional Limits for tasks assessed                                        Exercises Other Exercises Other Exercises: Sat edge of bed x 7 minutes.  Seated LAQ, marches and ankle pumps x 10.  Unable to lateral scoot depite max effort.  Unable to attempt standing.    General Comments        Pertinent Vitals/Pain Pain Assessment: No/denies pain    Home Living                      Prior Function  PT Goals (current goals can now be found in the care plan section) Progress towards PT goals: Progressing toward goals    Frequency    Min 2X/week      PT Plan Current plan remains appropriate    Co-evaluation              AM-PAC PT "6 Clicks" Daily Activity  Outcome Measure  Difficulty turning over in bed (including adjusting bedclothes, sheets and blankets)?: A Little Difficulty moving from lying on back to sitting on the side of the bed? : None Difficulty sitting down on and standing up from a chair with arms (e.g., wheelchair, bedside commode, etc,.)?: Unable Help needed moving to and from a bed to chair (including a wheelchair)?: Total Help needed walking in hospital room?: Total Help needed climbing 3-5 steps with a railing? : Total 6 Click Score: 11    End of Session Equipment Utilized During Treatment: Gait belt;Oxygen Activity Tolerance: Patient limited by fatigue Patient left: in bed;with  call bell/phone within reach;with family/visitor present Nurse Communication: Other (comment)       Time: 5188-4166 PT Time Calculation (min) (ACUTE ONLY): 13 min  Charges:  $Therapeutic Exercise: 8-22 mins                     Chesley Noon, PTA 03/14/18, 9:37 AM

## 2018-03-15 LAB — CBC
HEMATOCRIT: 36.7 % (ref 36.0–46.0)
HEMOGLOBIN: 11.5 g/dL — AB (ref 12.0–15.0)
MCH: 29.6 pg (ref 26.0–34.0)
MCHC: 31.3 g/dL (ref 30.0–36.0)
MCV: 94.6 fL (ref 80.0–100.0)
Platelets: 143 10*3/uL — ABNORMAL LOW (ref 150–400)
RBC: 3.88 MIL/uL (ref 3.87–5.11)
RDW: 14.3 % (ref 11.5–15.5)
WBC: 5.8 10*3/uL (ref 4.0–10.5)
nRBC: 0 % (ref 0.0–0.2)

## 2018-03-15 LAB — CREATININE, SERUM
CREATININE: 0.82 mg/dL (ref 0.44–1.00)
GFR calc Af Amer: >60 mL/min (ref >60)

## 2018-03-15 LAB — C DIFFICILE QUICK SCREEN W PCR REFLEX
C Diff antigen: POSITIVE — AB
C Diff interpretation: DETECTED
C Diff toxin: POSITIVE — AB

## 2018-03-15 MED ORDER — LORATADINE 10 MG PO TABS
10.0000 mg | ORAL_TABLET | Freq: Every day | ORAL | Status: DC
  Administered 2018-03-15 – 2018-03-20 (×6): 10 mg via ORAL
  Filled 2018-03-15 (×6): qty 1

## 2018-03-15 MED ORDER — OMEGA-3-ACID ETHYL ESTERS 1 G PO CAPS
1.0000 g | ORAL_CAPSULE | Freq: Every day | ORAL | Status: DC
  Administered 2018-03-15 – 2018-03-20 (×6): 1 g via ORAL
  Filled 2018-03-15 (×6): qty 1

## 2018-03-15 MED ORDER — DIVALPROEX SODIUM ER 250 MG PO TB24
250.0000 mg | ORAL_TABLET | Freq: Two times a day (BID) | ORAL | Status: DC
  Administered 2018-03-15 – 2018-03-20 (×9): 250 mg via ORAL
  Filled 2018-03-15 (×11): qty 1

## 2018-03-15 MED ORDER — MAGNESIUM OXIDE 400 (241.3 MG) MG PO TABS
400.0000 mg | ORAL_TABLET | Freq: Two times a day (BID) | ORAL | Status: DC
  Administered 2018-03-15 – 2018-03-20 (×11): 400 mg via ORAL
  Filled 2018-03-15 (×11): qty 1

## 2018-03-15 MED ORDER — VANCOMYCIN 50 MG/ML ORAL SOLUTION
125.0000 mg | Freq: Four times a day (QID) | ORAL | Status: DC
  Administered 2018-03-15 – 2018-03-20 (×21): 125 mg via ORAL
  Filled 2018-03-15 (×26): qty 2.5

## 2018-03-15 MED ORDER — VITAMIN B-12 1000 MCG PO TABS
1000.0000 ug | ORAL_TABLET | Freq: Every day | ORAL | Status: DC
  Administered 2018-03-15 – 2018-03-20 (×6): 1000 ug via ORAL
  Filled 2018-03-15 (×6): qty 1

## 2018-03-15 MED ORDER — OXYBUTYNIN CHLORIDE ER 10 MG PO TB24
10.0000 mg | ORAL_TABLET | Freq: Every day | ORAL | Status: DC
  Administered 2018-03-15 – 2018-03-19 (×5): 10 mg via ORAL
  Filled 2018-03-15 (×6): qty 1

## 2018-03-15 MED ORDER — SACCHAROMYCES BOULARDII 250 MG PO CAPS
250.0000 mg | ORAL_CAPSULE | Freq: Two times a day (BID) | ORAL | Status: DC
  Administered 2018-03-15 – 2018-03-20 (×11): 250 mg via ORAL
  Filled 2018-03-15 (×11): qty 1

## 2018-03-15 MED ORDER — IPRATROPIUM-ALBUTEROL 0.5-2.5 (3) MG/3ML IN SOLN
3.0000 mL | Freq: Four times a day (QID) | RESPIRATORY_TRACT | Status: DC
  Administered 2018-03-16 – 2018-03-20 (×16): 3 mL via RESPIRATORY_TRACT
  Filled 2018-03-15 (×18): qty 3

## 2018-03-15 NOTE — Progress Notes (Signed)
Daily Progress Note   Patient Name: Vicki Morales       Date: 03/15/2018 DOB: March 13, 1941  Age: 77 y.o. MRN#: [id number] Attending Physician: Dustin Flock, MD Primary Care Physician: Birdie Sons, MD Admit Date: 03/05/2018  Reason for Consultation/Follow-up: Establishing goals of care  Subjective: Patient lying in bed resting. Easily aroused. A&O x3. Denies pain but complains of some abdominal discomfort. States she continues to have diarrhea and thinks it is coming from all of the antibiotics. Patient now has C. Difficile and is on oral vancomycin.   No family at bedside. We briefly reviewed goals of care discussion on yesterday. Patient verbalized she remains hopeful that she can go back to SNF and participate in rehab and return back home with her daughter. She states she is still weak and unable to walk much. Denies shortness of breath, although she appears dyspneic when talking. Verbalizes wishes for outpatient palliative to follow after discharge.   Chart Reviewed.   Length of Stay: 10  Current Medications: Scheduled Meds:  . albuterol  2.5 mg Nebulization TID  . amiodarone  200 mg Oral BID  . apixaban  5 mg Oral BID  . aspirin EC  81 mg Oral Daily  . budesonide (PULMICORT) nebulizer solution  0.5 mg Nebulization BID  . busPIRone  10 mg Oral TID  . digoxin  0.0625 mg Oral Daily  . diltiazem  120 mg Oral Daily  . divalproex  250 mg Oral Q12H  . famotidine  20 mg Oral Daily  . fluticasone  1 spray Each Nare BID  . furosemide  40 mg Intravenous Q12H  . gabapentin  100 mg Oral BID WC  . gabapentin  600 mg Oral QHS  . Melatonin  10 mg Oral QHS  . methylPREDNISolone (SOLU-MEDROL) injection  40 mg Intravenous Daily  . midodrine  5 mg Oral TID WC  . montelukast  10 mg Oral  QHS  . multivitamin with minerals  1 tablet Oral Daily  . protein supplement shake  11 oz Oral BID BM  . risperiDONE  1 mg Oral QHS  . theophylline  200 mg Oral Daily  . vancomycin  125 mg Oral Q6H  . vitamin C  250 mg Oral BID    Continuous Infusions:   PRN Meds: ALPRAZolam, alum & mag hydroxide-simeth,  guaiFENesin-dextromethorphan, hydrALAZINE, ipratropium-albuterol, menthol-cetylpyridinium, ondansetron **OR** ondansetron (ZOFRAN) IV, traZODone  Physical Exam  Constitutional: She is oriented to person, place, and time. Vital signs are normal. She is cooperative. She appears ill.  Chronically ill appearing   Cardiovascular: Normal rate, regular rhythm, normal heart sounds and normal pulses.  Bilateral pedal edema, bilateral upper extremity edema (anasarca)   Pulmonary/Chest: She has decreased breath sounds. She has wheezes.  2L/[place], shortness of breath during conversation and with exertion    Abdominal: Soft. Normal appearance. Bowel sounds are increased. There is tenderness.  Obese   Musculoskeletal:  Generalized weakness   Neurological: She is alert and oriented to person, place, and time.  Nursing note and vitals reviewed.           Vital Signs: BP (!) 119/53 (BP Location: Left Arm)   Pulse 64   Temp 97.6 F (36.4 C) (Oral)   Resp 17   Ht 5\' 4"  (1.626 m)   Wt 120.7 kg   SpO2 93%   BMI 45.66 kg/m  SpO2: SpO2: 93 % O2 Device: O2 Device: Nasal Cannula O2 Flow Rate: O2 Flow Rate (L/min): 2 L/min  Intake/output summary:   Intake/Output Summary (Last 24 hours) at 03/15/2018 1233 Last data filed at 03/15/2018 7902 Gross per 24 hour  Intake -  Output 650 ml  Net -650 ml   LBM: Last BM Date: 03/15/18 Baseline Weight: Weight: 126.1 kg Most recent weight: Weight: 120.7 kg      Palliative Assessment/Data: PPS 30%   Patient Active Problem List   Diagnosis Date Noted  . Acute and chronic respiratory failure with hypercapnia ([place]) 03/05/2018  . COPD exacerbation ([place])  02/19/2018  . Diastolic dysfunction 40/97/3532  . Sepsis ([place]) 12/03/2017  . Schizophrenia, undifferentiated ([place]) 11/24/2017  . COPD with acute exacerbation ([place]) 10/24/2017  . Nocturnal hypoxia 08/21/2016  . Restless leg syndrome 08/20/2016  . Impingement syndrome of shoulder region 07/29/2016  . Hyponatremia 03/21/2016  . Pneumonia 03/18/2016  . History of suicide attempt 03/16/2016  . Overdose 03/16/2016  . Pulmonary hypertension ([place]) 11/11/2015  . Anxiety 09/22/2015  . Depression 09/22/2015  . Osteoarthritis 09/10/2015  . Rosacea 07/03/2015  . Breast pain 11/07/2014  . Callus of foot 11/07/2014  . CN (constipation) 11/07/2014  . Dermatitis, eczematoid 11/07/2014  . Diverticulosis of colon 11/07/2014  . Dizziness 11/07/2014  . Can't get food down 11/07/2014  . Accumulation of fluid in tissues 11/07/2014  . Fatigue 11/07/2014  . FOM (frequency of micturition) 11/07/2014  . Tension type headache 11/07/2014  . LBP (low back pain) 11/07/2014  . Episodic mood disorder ([place]) 11/07/2014  . Extreme obesity 11/07/2014  . Muscle ache 11/07/2014  . Disturbance of skin sensation 11/07/2014  . Awareness of heartbeats 11/07/2014  . Jerking 11/07/2014  . Body tinea 11/07/2014  . Essential (primary) hypertension 10/10/2014  . Moderate COPD (chronic obstructive pulmonary disease) ([place]) 04/01/2014  . Hx of obesity 04/01/2014  . CAFL (chronic airflow limitation) ([place]) 01/25/2009  . Malaise and fatigue 11/26/2008  . Aphasia 09/26/2008  . 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 GERD (gastroesophageal reflux disease) 06/07/1998  . HLD (hyperlipidemia) 06/07/1998  . OP (osteoporosis) 06/07/1998  . H/O total hysterectomy 03/08/1987  . Schizophrenia, in remission [facility]) 06/07/1978    Palliative Care Assessment & Plan   Patient Profile: 77 y.o. female  admitted on 03/05/2018 from [facility] with complaints of altered mental status and respiratory distress.  She has a past medical hisoty of atrial  fibrillation, anxiety, depression, arthritis, GERD, hypertension, paranoid disorder, chronic knee pain, and COPD. Patient was recently discharged on 9/22 with COPD exacerbation. She was discharged on her maintenance inhalers, prednisone taper, and empiric Zithromax. She was discharged to [facility] for rehabilitation. Patient was A&O during her breakfast according to SNF staff, around 11 am patient was found to be confused and also short of breath. Staff then requested patient to be sent for evaluation. During her ED course ABG showed elevated PCO2 at 85. She was placed on BiPAP for support. Chest x-ray concerning for right-sided pneumonia. Albumin 2.8. BNP 213.0. WBC 13.7. Since admission she has been treated with IV antibiotic for sepsis and have been able to wean off BiPAP to 2L/[place]. She is being followed by pulmonology and is s/p thoracentesis on 10/7 yielding 739ml of amber colored fluid. Palliative Medicine team consulted for goals of care discussion.   Recommendations/Plan:  DNR/DNI-as confirmed  Continue to treat the treatable.   Patient/Family goal is to return to SNF with rehab and outpatient palliative.   PMT will continue to follow.   Goals of Care and Additional Recommendations:  Limitations on Scope of Treatment: Full Scope Treatment  Code Status:    Code Status Orders  (From admission, onward)         Start     Ordered   03/05/18 1337  Do not attempt resuscitation (DNR)  Continuous    Question Answer Comment  In the event of cardiac or respiratory ARREST Do not call a "code blue"   In the event of cardiac or respiratory ARREST Do not perform Intubation, CPR, defibrillation or ACLS   In the event of cardiac or respiratory ARREST Use medication by any route, position, wound care, and other measures to relive pain and suffering. May use oxygen, suction and manual treatment of airway obstruction as needed for comfort.     Comments nurse may pronounce      03/05/18 1338        Code Status History    Date Active Date Inactive Code Status Order ID Comments User Context   02/19/2018 1709 02/26/2018 1634 DNR [id number]  Loletha Grayer, MD ED   12/03/2017 1258 12/06/2017 1847 DNR [id number]  Gladstone Lighter, MD Inpatient   10/24/2017 1844 10/26/2017 1623 DNR [id number]  Vaughan Basta, MD Inpatient   09/23/2015 0018 09/23/2015 1823 DNR [id number]  Lance Coon, MD Inpatient    Advance Directive Documentation     Most Recent Value  Type of Advance Directive  Out of facility DNR (pink MOST or yellow form)  Pre-existing out of facility DNR order (yellow form or pink MOST form)  -  "MOST" Form in Place?  -       Prognosis:  Unable to determine -Guarded to poor in the setting of hypertension, GERD, COPD, anxiety, depression, acute on chronic respiratory failure with hypercapnia, asthma, sepsis, decreased mobility, a-fib, generalized weakness, and anasarca.  Discharge Planning:  [place] for rehab with Palliative care service follow-up  Care plan was discussed with patient, patient's daughter, bedside RN.   Thank you for allowing the Palliative Medicine Team to assist in the care of this patient.   Total Time 35 min.  Prolonged Time Billed NO       Greater than 50%  of this time was spent counseling and coordinating care related to the above assessment and plan.  Alda Lea, AGPCNP-BC Palliative Medicine Team  Pager: [phone] Amion: Bjorn Pippin   Please contact Palliative Medicine  Team phone at [phone] for questions and concerns.

## 2018-03-15 NOTE — Clinical Social Work Note (Signed)
CSW spoke to [place] at [facility] care and she said patient's insurance Josem [employer]aufmann is good through the end of the week.  CSW to continue to facilitate discharge planning back to SNF.  Jones Broom. Parker, MSW, [employer]  03/15/2018 5:59 PM

## 2018-03-15 NOTE — Progress Notes (Signed)
Pt tested for C. Diff due to 5 loose bowels in 24 hours. Enteric precautions initiated.   Pt tested positive for C. Diff, Dr. Jannifer Franklin notified. Precautions continued.
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
## 2018-03-15 NOTE — Progress Notes (Signed)
Physical Therapy Treatment Patient Details Name: Vicki Morales MRN: [id number] DOB: 07-13-1940 Today's Date: 03/15/2018    History of Present Illness Pt presented to ER on 03/05/2018 with c/c of altered mental status and was admitted for acute and chronic respiratory failure with hypercapnia. Pt is positive for strep pnuemonia bactermia, R sided pnuemonia. Pt was off and on BiPAP on 9/29-10/1 now on 4L via nasal cannula. PMH includes COPD, chronic A-fib, GERD, HTN, orthopnea, anxiety and depression, chronic knee pain, and RLS.    PT Comments    Pt is making good progress towards goals with ability to perform standing attempts this date with +2 assist. Bed elevated slightly prior to performance. Pt remains motivated to participate and very thankful that therapist worked with her today. Good endurance with there-ex. Will continue to progress as able. O2 at 2L with sats WNL with exertion.   Follow Up Recommendations  SNF     Equipment Recommendations  None recommended by PT    Recommendations for Other Services       Precautions / Restrictions Precautions Precautions: Fall Restrictions Weight Bearing Restrictions: No    Mobility  Bed Mobility Overal bed mobility: Needs Assistance Bed Mobility: Supine to Sit     Supine to sit: Min assist Sit to supine: Min assist   General bed mobility comments: able to use railing and come to EOB with little assistance needed. ONce seated at EOB, able to sit with upright posture for extended time. O2 sats WNL with exertion.  Transfers Overall transfer level: Needs assistance Equipment used: Rolling walker (2 wheeled) Transfers: Sit to/from Stand Sit to Stand: Mod assist;+2 safety/equipment;From elevated surface         General transfer comment: able to perform 2 attempts for standing with RW. Needed B knees blocked and able to stand approx 6 seconds prior to fatigue. Increased SOB symptoms noted   Ambulation/Gait              General Gait Details: unable to perform   Stairs             Wheelchair Mobility    Modified Rankin (Stroke Patients Only)       Balance                                            Cognition Arousal/Alertness: Awake/alert Behavior During Therapy: WFL for tasks assessed/performed Overall Cognitive Status: Within Functional Limits for tasks assessed                                        Exercises Other Exercises Other Exercises: Supine ther-ex performed including 2 pulm exercises including IS and flutter valve. Also performed B LE ankle pumps, heel slides, and hip add squeezes. ALl ther-ex performed x 15 reps with cues for correct technique. CGA given    General Comments        Pertinent Vitals/Pain Pain Assessment: No/denies pain    Home Living                      Prior Function            PT Goals (current goals can now be found in the care plan section) Acute Rehab PT Goals Patient Stated Goal: To go to rehab  to improve strength and mobility PT Goal Formulation: With patient Time For Goal Achievement: 03/22/18 Potential to Achieve Goals: Fair Progress towards PT goals: Progressing toward goals    Frequency    Min 2X/week      PT Plan Current plan remains appropriate    Co-evaluation              AM-PAC PT "6 Clicks" Daily Activity  Outcome Measure  Difficulty turning over in bed (including adjusting bedclothes, sheets and blankets)?: Unable Difficulty moving from lying on back to sitting on the side of the bed? : Unable Difficulty sitting down on and standing up from a chair with arms (e.g., wheelchair, bedside commode, etc,.)?: Unable Help needed moving to and from a bed to chair (including a wheelchair)?: Total Help needed walking in hospital room?: Total Help needed climbing 3-5 steps with a railing? : Total 6 Click Score: 6    End of Session Equipment Utilized During Treatment: Gait  belt;Oxygen Activity Tolerance: Patient limited by fatigue Patient left: in bed;with bed alarm set Nurse Communication: Mobility status PT Visit Diagnosis: Unsteadiness on feet (R26.81);Muscle weakness (generalized) (M62.81)     Time: 3833-3832 PT Time Calculation (min) (ACUTE ONLY): 39 min  Charges:  $Therapeutic Exercise: 23-37 mins $Therapeutic Activity: 8-22 mins                     Greggory Stallion, PT, DPT [phone]    Vicki Morales 03/15/2018, 5:07 PM

## 2018-03-15 NOTE — Care Management (Signed)
Barrier Remains on IV lasix due to anasarca. C Diff- started oral vancomycin today. IV steroids

## 2018-03-16 ENCOUNTER — Telehealth: Payer: Self-pay

## 2018-03-16 LAB — BASIC METABOLIC PANEL
Anion gap: 9 (ref 5–15)
BUN: 35 mg/dL — ABNORMAL HIGH (ref 8–23)
CHLORIDE: 89 mmol/L — AB (ref 98–111)
CO2: 38 mmol/L — ABNORMAL HIGH (ref 22–32)
Calcium: 8.2 mg/dL — ABNORMAL LOW (ref 8.9–10.3)
Creatinine, Ser: 0.78 mg/dL (ref 0.44–1.00)
GFR calc non Af Amer: >60 mL/min (ref >60)
Glucose, Bld: 107 mg/dL — ABNORMAL HIGH (ref 70–99)
POTASSIUM: 3.9 mmol/L (ref 3.5–5.1)
SODIUM: 136 mmol/L (ref 135–145)

## 2018-03-16 LAB — GLUCOSE, CAPILLARY
GLUCOSE-CAPILLARY: 90 mg/dL (ref 70–99)
GLUCOSE-CAPILLARY: 85 mg/dL (ref 70–99)
Glucose-Capillary: 145 mg/dL — ABNORMAL HIGH (ref 70–99)

## 2018-03-16 LAB — CYTOLOGY - NON PAP

## 2018-03-16 MED ORDER — FUROSEMIDE 20 MG PO TABS
20.0000 mg | ORAL_TABLET | Freq: Two times a day (BID) | ORAL | Status: DC
  Administered 2018-03-16 – 2018-03-17 (×2): 20 mg via ORAL
  Filled 2018-03-16 (×2): qty 1

## 2018-03-16 NOTE — Progress Notes (Signed)
Physical Therapy Treatment Patient Details Name: Vicki Morales MRN: [id number] DOB: [birth year] Today's Date: 03/16/2018    History of Present Illness Pt presented to ER on 03/05/2018 with c/c of altered mental status and was admitted for acute and chronic respiratory failure with hypercapnia. Pt is positive for strep pnuemonia bactermia, R sided pnuemonia. Pt was off and on BiPAP on 9/29-10/1 now on 4L via nasal cannula. PMH includes COPD, chronic A-fib, GERD, HTN, orthopnea, anxiety and depression, chronic knee pain, and RLS.    PT Comments    Pt is pleasant and agreeable to PT. Per pt she is feeling much more tired today than yesterday. Performance this afternoon reflects fatigue pt stated. Continues to perform ther-ex with cues only for technique. She also continues to perform mobility with an awareness for her safety. Bed mobility and transfers pt was SOB, but O2 stats remained in 90s t/o session. Will continue to progress mobility/strength as able.   Follow Up Recommendations  SNF     Equipment Recommendations  None recommended by PT    Recommendations for Other Services       Precautions / Restrictions Precautions Precautions: Fall Restrictions Weight Bearing Restrictions: No    Mobility  Bed Mobility Overal bed mobility: Needs Assistance Bed Mobility: Supine to Sit     Supine to sit: Min assist Sit to supine: Max assist;+2 for safety/equipment   General bed mobility comments: Pt continues to demonstrate ability to safely log roll and come to sit without need for VC for sequencing. Use Min A to come to sit for trunk control, independently manages LE. Moving for sit-to-supine required Max A d/t pt fatigue at end of session.  Transfers Overall transfer level: Needs assistance Equipment used: Rolling walker (2 wheeled) Transfers: Sit to/from Stand Sit to Stand: Max assist;+2 physical assistance;From elevated surface         General transfer comment: Able to  perform 2 attempts for standing with RW. First attempt stands approx. 5 sec prior to fatigue, VC to extend knee. Second attempt pt could raise hips off bed approx. 4 inches, but fatigued and had to sit down. Inc SOB sx noted.  Ambulation/Gait             General Gait Details: unable to perform   Stairs             Wheelchair Mobility    Modified Rankin (Stroke Patients Only)       Balance                                            Cognition Arousal/Alertness: Awake/alert Behavior During Therapy: WFL for tasks assessed/performed Overall Cognitive Status: Within Functional Limits for tasks assessed                                        Exercises Other Exercises Other Exercises: Supine BLE AROM ankle pumps, quad sets, SLRs, hip ABD/ADD. All ther-ex performed 15 reps. VC for technique. Other Exercises: Sat EOB x5 minutes, could not perform ant/post scooting d/t fatigue.    General Comments        Pertinent Vitals/Pain Pain Assessment: No/denies pain    Home Living  Prior Function            PT Goals (current goals can now be found in the care plan section) Acute Rehab PT Goals Patient Stated Goal: To go to rehab to improve strength and mobility PT Goal Formulation: With patient Time For Goal Achievement: 03/22/18 Potential to Achieve Goals: Fair Progress towards PT goals: Progressing toward goals    Frequency    Min 2X/week      PT Plan Current plan remains appropriate    Co-evaluation              AM-PAC PT "6 Clicks" Daily Activity  Outcome Measure  Difficulty turning over in bed (including adjusting bedclothes, sheets and blankets)?: Unable Difficulty moving from lying on back to sitting on the side of the bed? : Unable Difficulty sitting down on and standing up from a chair with arms (e.g., wheelchair, bedside commode, etc,.)?: Unable Help needed moving to and from a  bed to chair (including a wheelchair)?: Total Help needed walking in hospital room?: Total Help needed climbing 3-5 steps with a railing? : Total 6 Click Score: 6    End of Session Equipment Utilized During Treatment: Gait belt;Oxygen Activity Tolerance: Patient limited by fatigue Patient left: in bed;with bed alarm set;with call bell/phone within reach Nurse Communication: Mobility status PT Visit Diagnosis: Unsteadiness on feet (R26.81);Muscle weakness (generalized) (M62.81)     Time: 8588-5027 PT Time Calculation (min) (ACUTE ONLY): 29 min  Charges:                        Algis Downs, SPT 03/16/2018, 5:10 PM

## 2018-03-16 NOTE — Progress Notes (Signed)
Bipap order changed from QHS to PRN. Patient has not been on Bipap since 03/05/2018  Patient resting comfortably on 2L [place] SAT at 94%

## 2018-03-16 NOTE — Progress Notes (Signed)
Patient ID: Vicki Morales, female   DOB: Apr 26, 1941, 77 y.o.   MRN: [id number]   [facility] PROGRESS NOTE  Vicki Morales OEU:[id number] DOB: 1941-01-07 DOA: 03/05/2018 PCP: Birdie Sons, MD  HPI/Subjective:    pt diarrhea has improved, c/o weakness   Objective: Vitals:   03/16/18 1057 03/16/18 1141  BP:  (!) 121/55  Pulse: (!) 110 98  Resp:    Temp:  97.6 F (36.4 C)  SpO2:  93%    Filed Weights   03/13/18 0426 03/14/18 0448 03/15/18 0428  Weight: 122.2 kg 122.4 kg 120.7 kg    ROS: Review of Systems  Unable to perform ROS: Acuity of condition  Constitutional: Positive for weight loss. Negative for chills, fever and malaise/fatigue.  Eyes: Negative for blurred vision.  Respiratory: Negative for shortness of breath.   Cardiovascular: Negative for chest pain.  Gastrointestinal: Positive for diarrhea. Negative for abdominal pain, constipation, nausea and vomiting.  Genitourinary: Negative for dysuria.  Musculoskeletal: Negative for joint pain.  Neurological: Negative for dizziness and headaches.   Exam: Physical Exam  HENT:  Nose: No mucosal edema.  Mouth/Throat: No oropharyngeal exudate or posterior oropharyngeal edema.  Eyes: Pupils are equal, round, and reactive to light. Conjunctivae, EOM and lids are normal.  Neck: No JVD present. Carotid bruit is not present. No edema present. No thyroid mass and no thyromegaly present.  Cardiovascular: S1 normal and S2 normal. Exam reveals no gallop.  No murmur heard. Pulses:      Dorsalis pedis pulses are 2+ on the right side, and 2+ on the left side.  Respiratory: No respiratory distress. She has decreased breath sounds in the right lower field and the left lower field. She has wheezes in the right lower field and the left lower field. She has no rhonchi. She has no rales.  GI: Soft. Bowel sounds are normal. There is no tenderness.  Musculoskeletal: She exhibits edema.       Right ankle: She exhibits swelling.        Left ankle: She exhibits swelling.  Lymphadenopathy:    She has no cervical adenopathy.  Neurological: She is alert. No cranial nerve deficit.  Skin: Skin is warm. No rash noted. Nails show no clubbing.  Psychiatric: She has a normal mood and affect.      Data Reviewed: Basic Metabolic Panel: Recent Labs  Lab 03/11/18 0635 03/12/18 0446 03/13/18 0544 03/15/18 0622 03/16/18 0433  NA 139 137 133*  --  136  K 5.5* 4.5 4.9  --  3.9  CL 95* 94* 92*  --  89*  CO2 37* 36* 37*  --  38*  GLUCOSE 110* 97 112*  --  107*  BUN 26* 18 22  --  35*  CREATININE 0.69 0.73 0.74 0.82 0.78  CALCIUM 8.8* 8.5* 8.1*  --  8.2*   Liver Function Tests: No results for input(s): AST, ALT, ALKPHOS, BILITOT, PROT, ALBUMIN in the last 168 hours. CBC: Recent Labs  Lab 03/11/18 0453 03/12/18 0446 03/15/18 0622  WBC 11.9* 15.8* 5.8  HGB 13.1 12.4 11.5*  HCT 39.1 38.1 36.7  MCV 91.5 91.3 94.6  PLT 171 168 143*   Cardiac Enzymes: No results for input(s): CKTOTAL, CKMB, CKMBINDEX, TROPONINI in the last 168 hours. BNP (last 3 results) Recent Labs    03/05/18 1124  BNP 213.0*    CBG: Recent Labs  Lab 03/14/18 0739 03/14/18 0759 03/14/18 1224 03/16/18 0742 03/16/18 1140  GLUCAP 50* 72 94 90 85  Recent Results (from the past 240 hour(s))  CULTURE, BLOOD (ROUTINE X 2) w Reflex to ID Panel     Status: None   Collection Time: 03/09/18  2:23 PM  Result Value Ref Range Status   Specimen Description BLOOD BLOOD LEFT HAND  Final   Special Requests   Final    BOTTLES DRAWN AEROBIC AND ANAEROBIC Blood Culture adequate volume   Culture   Final    NO GROWTH 5 DAYS Performed at [facility], [address] [place], [place] [address]    Report Status 03/14/2018 FINAL  Final  CULTURE, BLOOD (ROUTINE X 2) w Reflex to ID Panel     Status: None   Collection Time: 03/09/18  2:34 PM  Result Value Ref Range Status   Specimen Description BLOOD BLOOD RIGHT HAND  Final   Special Requests    Final    BOTTLES DRAWN AEROBIC AND ANAEROBIC Blood Culture adequate volume   Culture   Final    NO GROWTH 5 DAYS Performed at [facility], [address] [place], [place] [address]    Report Status 03/14/2018 FINAL  Final  Body fluid culture     Status: None (Preliminary result)   Collection Time: 03/13/18  4:56 PM  Result Value Ref Range Status   Specimen Description   Final    PLEURAL Performed at [facility], [address], [place], [place] [address]    Special Requests   Final    NONE Performed at [facility], [address] [place], [place] [address]    Gram Stain   Final    FEW WBC PRESENT, PREDOMINANTLY MONONUCLEAR NO ORGANISMS SEEN    Culture   Final    NO GROWTH 3 DAYS Performed at [facility], [place] [address], [place], [place] [address]    Report Status PENDING  Incomplete  C difficile quick scan w PCR reflex     Status: Abnormal   Collection Time: 03/15/18  4:51 AM  Result Value Ref Range Status   C Diff antigen POSITIVE (A) NEGATIVE Final   C Diff toxin POSITIVE (A) NEGATIVE Final   C Diff interpretation Toxin producing C. difficile detected.  Final    Comment: CRITICAL RESULT CALLED TO, READ BACK BY AND VERIFIED WITH:  Kyla Balzarine AT 0973 03/15/18 SDR Performed at [facility], [address] [place], [place] [address]       Scheduled Meds: . amiodarone  200 mg Oral BID  . apixaban  5 mg Oral BID  . aspirin EC  81 mg Oral Daily  . budesonide (PULMICORT) nebulizer solution  0.5 mg Nebulization BID  . busPIRone  10 mg Oral TID  . digoxin  0.0625 mg Oral Daily  . divalproex  250 mg Oral BID  . famotidine  20 mg Oral Daily  . fluticasone  1 spray Each Nare BID  . furosemide  40 mg Intravenous Q12H  . gabapentin  100 mg Oral BID WC  . gabapentin  600 mg Oral QHS  . ipratropium-albuterol  3 mL Nebulization Q6H  . loratadine  10 mg Oral Daily  . magnesium oxide  400 mg Oral BID  . Melatonin  10 mg Oral QHS   . methylPREDNISolone (SOLU-MEDROL) injection  40 mg Intravenous Daily  . midodrine  5 mg Oral TID WC  . montelukast  10 mg Oral QHS  . multivitamin with minerals  1 tablet Oral Daily  . omega-3 acid ethyl esters  1 g Oral Daily  . oxybutynin  10 mg Oral QHS  . protein supplement shake  11 oz Oral BID BM  . risperiDONE  1 mg Oral QHS  . saccharomyces boulardii  250 mg Oral BID  . theophylline  200 mg Oral Daily  . vancomycin  125 mg Oral Q6H  . vitamin B-12  1,000 mcg Oral Daily  . vitamin C  250 mg Oral BID   Continuous Infusions:   Assessment/Plan:  1. Sepsis with Streptococcus pneumonia, Staph Hominus is likely a contamination.. Now has C. difficile on oral vancomycin 2. C. difficile colitis oral vancomycin 3. Acute respiratory failure with hypoxia and hypercapnia.  Patient required BiPAP in presentation.   Slowly oxygen requirement is improving continue current therapy 4. COPD exacerbation with poor air entry.  Change to oral prednisone  5. anasarca continue IV Lasix  6. paroxysmal atrial fibrillation on amiodarone and Eliquis and digoxin and Cardizem 7. Anxiety depression and restless leg syndrome.  Continue psychiatric medication for sleep as well as Ambien 8. Morbid obesity with a BMI of 46.43 9. Weakness.  Appreciate physical therapy consultation.  Patient interested in rehab.    Code Status:     Code Status Orders  (From admission, onward)         Start     Ordered   03/05/18 1337  Do not attempt resuscitation (DNR)  Continuous    Question Answer Comment  In the event of cardiac or respiratory ARREST Do not call a "code blue"   In the event of cardiac or respiratory ARREST Do not perform Intubation, CPR, defibrillation or ACLS   In the event of cardiac or respiratory ARREST Use medication by any route, position, wound care, and other measures to relive pain and suffering. May use oxygen, suction and manual treatment of airway obstruction as needed for comfort.    Comments nurse may pronounce      03/05/18 1338        Code Status History    Date Active Date Inactive Code Status Order ID Comments User Context   02/19/2018 1709 02/26/2018 1634 DNR [id number]  Loletha Grayer, MD ED   12/03/2017 1258 12/06/2017 1847 DNR [id number]  Gladstone Lighter, MD Inpatient   10/24/2017 1844 10/26/2017 1623 DNR [id number]  Vaughan Basta, MD Inpatient   09/23/2015 0018 09/23/2015 1823 DNR [id number]  Lance Coon, MD Inpatient    Advance Directive Documentation     Most Recent Value  Type of Advance Directive  Out of facility DNR (pink MOST or yellow form)  Pre-existing out of facility DNR order (yellow form or pink MOST form)  -  "MOST" Form in Place?  -     Family Communication: Tried to call the daughter but unable to leave a message on her cell phone. Disposition Plan: Hopefully over the weekend can go to [place] care  Consultants:  Critical care team  Antibiotics:  Rocephin while here probably flipped over the [place] upon getting out of the hospital.  Time spent: 27 minutes  [employer]

## 2018-03-16 NOTE — Telephone Encounter (Signed)
pt called and left a message with lea that she wanted  dr. Shea Evans to call her she is in the hospital again.

## 2018-03-16 NOTE — Care Management Important Message (Signed)
Important Message  Patient Details  Name: Vicki Morales MRN: [id number] Date of Birth: 1941/04/18   Medicare Important Message Given:  Yes    Shelbie Ammons, RN 03/16/2018, 10:53 AM

## 2018-03-17 ENCOUNTER — Telehealth: Payer: Self-pay | Admitting: Psychiatry

## 2018-03-17 DIAGNOSIS — J9602 Acute respiratory failure with hypercapnia: Secondary | ICD-10-CM

## 2018-03-17 DIAGNOSIS — J9601 Acute respiratory failure with hypoxia: Secondary | ICD-10-CM

## 2018-03-17 LAB — BASIC METABOLIC PANEL
Anion gap: 10 (ref 5–15)
BUN: 32 mg/dL — ABNORMAL HIGH (ref 8–23)
CO2: 34 mmol/L — AB (ref 22–32)
Calcium: 8.2 mg/dL — ABNORMAL LOW (ref 8.9–10.3)
Chloride: 87 mmol/L — ABNORMAL LOW (ref 98–111)
Creatinine, Ser: 0.79 mg/dL (ref 0.44–1.00)
GFR calc non Af Amer: >60 mL/min (ref >60)
GLUCOSE: 168 mg/dL — AB (ref 70–99)
POTASSIUM: 4 mmol/L (ref 3.5–5.1)
Sodium: 131 mmol/L — ABNORMAL LOW (ref 135–145)

## 2018-03-17 LAB — GLUCOSE, CAPILLARY
Glucose-Capillary: 114 mg/dL — ABNORMAL HIGH (ref 70–99)
Glucose-Capillary: 126 mg/dL — ABNORMAL HIGH (ref 70–99)

## 2018-03-17 LAB — BODY FLUID CULTURE: CULTURE: NO GROWTH

## 2018-03-17 MED ORDER — MIDODRINE HCL 5 MG PO TABS
10.0000 mg | ORAL_TABLET | Freq: Three times a day (TID) | ORAL | Status: DC
  Administered 2018-03-17 – 2018-03-20 (×6): 10 mg via ORAL
  Filled 2018-03-17 (×10): qty 2

## 2018-03-17 NOTE — Clinical Social Work Note (Signed)
CSW notified by Claiborne Billings, Admissions Coordinator from [facility] [facility] that she received new authorization for patient and it is good through Monday. CSW will continue to follow for discharge planning.   [place], [place]

## 2018-03-17 NOTE — Progress Notes (Signed)
Pt c/o not being able to urinate. Noted 300 ml in canister. Pt says she is in pain and believes her inability to urinate is related to the rectal tube. Pt states she wants the rectal tube out. Bladder scan showed 833. RN straight cathed pt and got 850 ml of urine out of the bladder and rectal tube taken out per patient request. Noted a bulge inside of pts vagina, possibly bladder prolapse. MD notified, will continue to monitor.

## 2018-03-17 NOTE — Telephone Encounter (Signed)
Called patient - she says she is in the hospital. She gave her son's phone number [employer]. She wants Probation officer to give him some information about her current psychiatric treatment. Attempted to contact her son. Not reachable- went to voicemail.

## 2018-03-17 NOTE — Clinical Social Work Note (Signed)
CSW spoke with Claiborne Billings, admissions coordinator for [facility] [facility] [facility] regarding patient. Claiborne Billings states that the [employer] that was obtained has been voided and a new auth must be started. Claiborne Billings is beginning the authorization today. CSW will continue to follow for discharge planning.   [place], [place]

## 2018-03-17 NOTE — Telephone Encounter (Signed)
Returned call to [facility]. He wants to know if his mother's mental health problem is hereditary and if there is a risk of him or his children inheriting it. He also wants more information about his mother's treatment and wants records . Provided him with clinic phone number and contact information to request medical records. Also discussed that if he needs to talk to writer , he has to call the main clinic phone number and leave a message and to not call the direct line since writer will be with other patients . He voiced understanding.

## 2018-03-17 NOTE — Progress Notes (Signed)
Per April, RN she received a verbal order for rectal tube.  Pt asked for rectal tube after several small bowel movements causing irritation to rectum.  Inserted rectal tube and placed barrier cream to buttocks and rectum. Dorna Bloom RN

## 2018-03-17 NOTE — Progress Notes (Signed)
Daily Progress Note   Patient Name: Vicki Morales       Date: 03/17/2018 DOB: Apr 22, 1941  Age: 77 y.o. MRN#: [id number] Attending Physician: Dustin Flock, MD Primary Care Physician: Birdie Sons, MD Admit Date: 03/05/2018  Reason for Consultation/Follow-up: Establishing goals of care  Subjective: Patient in bed. Awake. Alert and oriented x3. Denies pain. Complains of shortness of breath at times. Rectal was placed during the early morning due to loose stools and patient complaining of some sacral irritation. Reports she feels that the diarrhea has slowed down. Remains on enteric precautions for positive C-diff (Vancomycin orally). No family is at the bedside. Patient reports she remains hopeful that she will continue to improve and return to [facility] for rehab and then back home with her daughter. She continues to complain of weakness.   Son is in from [place] visiting and spending time with patient. He contacted me and I met him at bedside. He was appreciative of the care his mother was receiving. He inquired about genetic testing in regards to [facility] factor, which patient reports she had complications with in the 70's as well as information on her psychiatric official diagnosis and definition. States his son and wife is getting married and concerned about any genetic abnormalities.  Advised patient's son he would need to discuss further with patient and sister regarding his request. Patient verbalized she was not sure of definitive diagnosis. Again advised son and patient to contact her psychiatrist for any information needed as the medical team is not involved in that area of her care during her admission. He verbalized understanding and appreciation.   I did also review patient's  current conditions as she requested with her son, Vicki Morales. He verbalized understanding and agreement with her DNR/DNI status, returning to SNF for rehab, as well as having outpatient Palliative Care team to follow at facility and home.   Chart reviewed and report received from bedside RN.   Length of Stay: 12  Current Medications: Scheduled Meds:  . amiodarone  200 mg Oral BID  . apixaban  5 mg Oral BID  . aspirin EC  81 mg Oral Daily  . budesonide (PULMICORT) nebulizer solution  0.5 mg Nebulization BID  . busPIRone  10 mg Oral TID  . digoxin  0.0625 mg Oral Daily  . divalproex  250 mg Oral BID  . famotidine  20 mg Oral Daily  . fluticasone  1 spray Each Nare BID  . gabapentin  100 mg Oral BID WC  . gabapentin  600 mg Oral QHS  . ipratropium-albuterol  3 mL Nebulization Q6H  . loratadine  10 mg Oral Daily  . magnesium oxide  400 mg Oral BID  . Melatonin  10 mg Oral QHS  . methylPREDNISolone (SOLU-MEDROL) injection  40 mg Intravenous Daily  . midodrine  10 mg Oral TID WC  . montelukast  10 mg Oral QHS  . multivitamin with minerals  1 tablet Oral Daily  . omega-3 acid ethyl esters  1 g Oral Daily  . oxybutynin  10 mg Oral QHS  . protein supplement shake  11 oz Oral BID BM  . risperiDONE  1 mg Oral QHS  . saccharomyces boulardii  250 mg Oral BID  . theophylline  200 mg Oral Daily  . vancomycin  125 mg Oral Q6H  . vitamin B-12  1,000 mcg Oral Daily  . vitamin C  250 mg Oral BID    Continuous Infusions:   PRN Meds: ALPRAZolam, alum & mag hydroxide-simeth, guaiFENesin-dextromethorphan, hydrALAZINE, ipratropium-albuterol, menthol-cetylpyridinium, ondansetron **OR** ondansetron (ZOFRAN) IV, traZODone  Physical Exam  Constitutional: She is oriented to person, place, and time. Vital signs are normal. She is cooperative.  Cardiovascular: Normal rate, regular rhythm, normal heart sounds and normal pulses.  Bilateral trace edema   Pulmonary/Chest: She has decreased breath sounds. She  has wheezes.  Some shortness of breath at times during conversation   Abdominal: Soft. Normal appearance and bowel sounds are normal. There is tenderness.  Obese, complained of tenderness in lower areas   Musculoskeletal:  Generalized weakness  Neurological: She is alert and oriented to person, place, and time.  Skin: Skin is warm and dry. Bruising noted. There is erythema.  Some edema to upper extremities with redness   Nursing note and vitals reviewed.           Vital Signs: BP (!) 97/51   Pulse 79   Temp 97.7 F (36.5 C)   Resp 17   Ht '5\' 4"'  (1.626 m)   Wt 122.2 kg   SpO2 98%   BMI 46.24 kg/m  SpO2: SpO2: 98 % O2 Device: O2 Device: Nasal Cannula O2 Flow Rate: O2 Flow Rate (L/min): 2 L/min  Intake/output summary:   Intake/Output Summary (Last 24 hours) at 03/17/2018 1116 Last data filed at 03/17/2018 0930 Gross per 24 hour  Intake 240 ml  Output 1150 ml  Net -910 ml   LBM: Last BM Date: 03/16/18 Baseline Weight: Weight: 126.1 kg Most recent weight: Weight: 122.2 kg      Palliative Assessment/Data:PPS 30%   Patient Active Problem List   Diagnosis Date Noted  . Acute and chronic respiratory failure with hypercapnia ([place]) 03/05/2018  . COPD exacerbation ([place]) 02/19/2018  . Diastolic dysfunction 63/14/9702  . Sepsis ([place]) 12/03/2017  . Schizophrenia, undifferentiated ([place]) 11/24/2017  . COPD with acute exacerbation ([place]) 10/24/2017  . Nocturnal hypoxia 08/21/2016  . Restless leg syndrome 08/20/2016  . Impingement syndrome of shoulder region 07/29/2016  . Hyponatremia 03/21/2016  . Pneumonia 03/18/2016  . History of suicide attempt 03/16/2016  . Overdose 03/16/2016  . Pulmonary hypertension ([place]) 11/11/2015  . Anxiety 09/22/2015  . Depression 09/22/2015  . Osteoarthritis 09/10/2015  . Rosacea 07/03/2015  . Breast pain 11/07/2014  . Callus of foot 11/07/2014  . CN (constipation) 11/07/2014  . Dermatitis,  eczematoid 11/07/2014  . Diverticulosis of colon  11/07/2014  . Dizziness 11/07/2014  . Can't get food down 11/07/2014  . Accumulation of fluid in tissues 11/07/2014  . Fatigue 11/07/2014  . FOM (frequency of micturition) 11/07/2014  . Tension type headache 11/07/2014  . LBP (low back pain) 11/07/2014  . Episodic mood disorder ([place]) 11/07/2014  . Extreme obesity 11/07/2014  . Muscle ache 11/07/2014  . Disturbance of skin sensation 11/07/2014  . Awareness of heartbeats 11/07/2014  . Jerking 11/07/2014  . Body tinea 11/07/2014  . Essential (primary) hypertension 10/10/2014  . Moderate COPD (chronic obstructive pulmonary disease) ([place]) 04/01/2014  . Hx of obesity 04/01/2014  . CAFL (chronic airflow limitation) ([place]) 01/25/2009  . Malaise and fatigue 11/26/2008  . Aphasia 09/26/2008  . 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 GERD (gastroesophageal reflux disease) 06/07/1998  . HLD (hyperlipidemia) 06/07/1998  . OP (osteoporosis) 06/07/1998  . H/O total hysterectomy 03/08/1987  . Schizophrenia, in remission ([place]) 06/07/1978    Palliative Care Assessment & Plan   Patient Profile: 77 y.o.femaleadmitted on [facility] with complaints of altered mental status and respiratory distress. She has a past medical hisoty of atrial fibrillation, anxiety, depression, arthritis, GERD, hypertension, paranoid disorder, chronic knee pain, and COPD. Patient was recently discharged on 9/22 with COPD exacerbation. She was discharged on her maintenance inhalers, prednisone taper, and empiric Zithromax. She was discharged to [facility] for rehabilitation.Patient was A&O during her breakfast according to SNF staff, around 11 am patient was found to be confused and also short of breath. Staff then requested patient to be sent for evaluation.DuringherED courseABG showed elevated PCO2 at 85. She was placed on BiPAP for support. Chest x-ray concerning for right-sided pneumonia. Albumin 2.8. BNP 213.0. WBC  13.7. Since admission she has been treated with IV antibiotic for sepsis and have been able to wean off BiPAP to 2L/[place]. She is being followed by pulmonology and is s/p thoracentesis on 10/7 yielding 738m of amber colored fluid. Palliative Medicine team consulted for goals of care discussion.  Recommendations/Plan:  DNR/DNI  Continue to treat the treatable.   [facility]   Goals of Care and Additional Recommendations:  Limitations on Scope of Treatment: Full Scope Treatment  Code Status:    Code Status Orders  (From admission, onward)         Start     Ordered   03/05/18 1337  Do not attempt resuscitation (DNR)  Continuous    Question Answer Comment  In the event of cardiac or respiratory ARREST Do not call a "code blue"   In the event of cardiac or respiratory ARREST Do not perform Intubation, CPR, defibrillation or ACLS   In the event of cardiac or respiratory ARREST Use medication by any route, position, wound care, and other measures to relive pain and suffering. May use oxygen, suction and manual treatment of airway obstruction as needed for comfort.   Comments nurse may pronounce      03/05/18 1338        Code Status History    Date Active Date Inactive Code Status Order ID Comments User Context   02/19/2018 1709 02/26/2018 1634 DNR [id number] WLoletha Grayer MD ED   12/03/2017 1258 12/06/2017 1847 DNR [id number] KGladstone Lighter MD Inpatient   10/24/2017 1844 10/26/2017 1623 DNR [id number] VVaughan Basta MD Inpatient   09/23/2015 0018 09/23/2015 1823 DNR  [id number]  Lance Coon, MD Inpatient    Advance Directive Documentation     Most Recent Value  Type of Advance Directive  Out of facility DNR (pink MOST or yellow form)  Pre-existing out of facility DNR order (yellow form or pink MOST form)  -  "MOST" Form in Place?  -       Prognosis:   Unable to  determine-Guarded to poor in the setting of hypertension, GERD, COPD, anxiety, depression, acute on chronic respiratory failure with hypercapnia, asthma, sepsis, decreased mobility, a-fib, generalized weakness, and anasarca.  Discharge Planning:  [place] for rehab with Palliative care service follow-up  Care plan was discussed with patient, patient's son, and CSW, and bedside RN.   Thank you for allowing the Palliative Medicine Team to assist in the care of this patient.   Total Time 35 min.  Prolonged Time Billed  NO       Greater than 50%  of this time was spent counseling and coordinating care related to the above assessment and plan.  Alda Lea, AGPCNP-BC Palliative Medicine Team  Pager: [phone] Amion: Bjorn Pippin   Please contact Palliative Medicine Team phone at [phone] for questions and concerns.

## 2018-03-17 NOTE — Telephone Encounter (Signed)
error 

## 2018-03-17 NOTE — Progress Notes (Signed)
Patient ID: Vicki Morales, female   DOB: 1941-02-08, 77 y.o.   MRN: [id number]   [employer] PROGRESS NOTE  Vicki Morales XBD:[id number] DOB: 1940/12/26 DOA: 03/05/2018 PCP: Birdie Sons, MD  HPI/Subjective:   Continues to have diarrhea, patient also noticed to have urinary retention today   Objective: Vitals:   03/17/18 0726 03/17/18 1030  BP:  (!) 97/51  Pulse:  79  Resp:    Temp:    SpO2: 98% 98%    Filed Weights   03/14/18 0448 03/15/18 0428 03/17/18 0339  Weight: 122.4 kg 120.7 kg 122.2 kg    ROS: Review of Systems  Unable to perform ROS: Acuity of condition  Constitutional: Positive for weight loss. Negative for chills, fever and malaise/fatigue.  Eyes: Negative for blurred vision.  Respiratory: Negative for shortness of breath.   Cardiovascular: Negative for chest pain.  Gastrointestinal: Positive for diarrhea. Negative for abdominal pain, constipation, nausea and vomiting.  Genitourinary: Negative for dysuria.  Musculoskeletal: Negative for joint pain.  Neurological: Negative for dizziness and headaches.   Exam: Physical Exam  HENT:  Nose: No mucosal edema.  Mouth/Throat: No oropharyngeal exudate or posterior oropharyngeal edema.  Eyes: Pupils are equal, round, and reactive to light. Conjunctivae, EOM and lids are normal.  Neck: No JVD present. Carotid bruit is not present. No edema present. No thyroid mass and no thyromegaly present.  Cardiovascular: S1 normal and S2 normal. Exam reveals no gallop.  No murmur heard. Pulses:      Dorsalis pedis pulses are 2+ on the right side, and 2+ on the left side.  Respiratory: No respiratory distress. She has decreased breath sounds in the right lower field and the left lower field. She has wheezes in the right lower field and the left lower field. She has no rhonchi. She has no rales.  GI: Soft. Bowel sounds are normal. There is no tenderness.  Musculoskeletal: She exhibits edema.       Right ankle: She  exhibits swelling.       Left ankle: She exhibits swelling.  Lymphadenopathy:    She has no cervical adenopathy.  Neurological: She is alert. No cranial nerve deficit.  Skin: Skin is warm. No rash noted. Nails show no clubbing.  Psychiatric: She has a normal mood and affect.      Data Reviewed: Basic Metabolic Panel: Recent Labs  Lab 03/11/18 0635 03/12/18 0446 03/13/18 0544 03/15/18 0622 03/16/18 0433 03/17/18 1158  NA 139 137 133*  --  136 131*  K 5.5* 4.5 4.9  --  3.9 4.0  CL 95* 94* 92*  --  89* 87*  CO2 37* 36* 37*  --  38* 34*  GLUCOSE 110* 97 112*  --  107* 168*  BUN 26* 18 22  --  35* 32*  CREATININE 0.69 0.73 0.74 0.82 0.78 0.79  CALCIUM 8.8* 8.5* 8.1*  --  8.2* 8.2*   Liver Function Tests: No results for input(s): AST, ALT, ALKPHOS, BILITOT, PROT, ALBUMIN in the last 168 hours. CBC: Recent Labs  Lab 03/11/18 0453 03/12/18 0446 03/15/18 0622  WBC 11.9* 15.8* 5.8  HGB 13.1 12.4 11.5*  HCT 39.1 38.1 36.7  MCV 91.5 91.3 94.6  PLT 171 168 143*   Cardiac Enzymes: No results for input(s): CKTOTAL, CKMB, CKMBINDEX, TROPONINI in the last 168 hours. BNP (last 3 results) Recent Labs    03/05/18 1124  BNP 213.0*    CBG: Recent Labs  Lab 03/16/18 0742 03/16/18 1140 03/16/18 1701  03/17/18 0741 03/17/18 1136  GLUCAP 90 85 145* 114* 126*    Recent Results (from the past 240 hour(s))  CULTURE, BLOOD (ROUTINE X 2) w Reflex to ID Panel     Status: None   Collection Time: 03/09/18  2:23 PM  Result Value Ref Range Status   Specimen Description BLOOD BLOOD LEFT HAND  Final   Special Requests   Final    BOTTLES DRAWN AEROBIC AND ANAEROBIC Blood Culture adequate volume   Culture   Final    NO GROWTH 5 DAYS Performed at [facility], [address] [place], [place] [address]    Report Status 03/14/2018 FINAL  Final  CULTURE, BLOOD (ROUTINE X 2) w Reflex to ID Panel     Status: None   Collection Time: 03/09/18  2:34 PM  Result Value Ref Range  Status   Specimen Description BLOOD BLOOD RIGHT HAND  Final   Special Requests   Final    BOTTLES DRAWN AEROBIC AND ANAEROBIC Blood Culture adequate volume   Culture   Final    NO GROWTH 5 DAYS Performed at [facility], [address] [place], [place] [address]    Report Status 03/14/2018 FINAL  Final  Body fluid culture     Status: None   Collection Time: 03/13/18  4:56 PM  Result Value Ref Range Status   Specimen Description   Final    PLEURAL Performed at [facility], [address], [place], [place] [address]    Special Requests   Final    NONE Performed at [facility], [address] [place], [place] [address]    Gram Stain   Final    FEW WBC PRESENT, PREDOMINANTLY MONONUCLEAR NO ORGANISMS SEEN    Culture   Final    NO GROWTH 3 DAYS Performed at [facility], [place] [address], [place], [place] [address]    Report Status 03/17/2018 FINAL  Final  C difficile quick scan w PCR reflex     Status: Abnormal   Collection Time: 03/15/18  4:51 AM  Result Value Ref Range Status   C Diff antigen POSITIVE (A) NEGATIVE Final   C Diff toxin POSITIVE (A) NEGATIVE Final   C Diff interpretation Toxin producing C. difficile detected.  Final    Comment: CRITICAL RESULT CALLED TO, READ BACK BY AND VERIFIED WITH:  Kyla Balzarine AT 1308 03/15/18 SDR Performed at [facility], [address] [place], [place] [address]       Scheduled Meds: . amiodarone  200 mg Oral BID  . apixaban  5 mg Oral BID  . aspirin EC  81 mg Oral Daily  . budesonide (PULMICORT) nebulizer solution  0.5 mg Nebulization BID  . busPIRone  10 mg Oral TID  . digoxin  0.0625 mg Oral Daily  . divalproex  250 mg Oral BID  . famotidine  20 mg Oral Daily  . fluticasone  1 spray Each Nare BID  . gabapentin  100 mg Oral BID WC  . gabapentin  600 mg Oral QHS  . ipratropium-albuterol  3 mL Nebulization Q6H  . loratadine  10 mg Oral Daily  . magnesium oxide  400 mg Oral BID   . Melatonin  10 mg Oral QHS  . methylPREDNISolone (SOLU-MEDROL) injection  40 mg Intravenous Daily  . midodrine  10 mg Oral TID WC  . montelukast  10 mg Oral QHS  . multivitamin with minerals  1 tablet Oral Daily  .  omega-3 acid ethyl esters  1 g Oral Daily  . oxybutynin  10 mg Oral QHS  . protein supplement shake  11 oz Oral BID BM  . risperiDONE  1 mg Oral QHS  . saccharomyces boulardii  250 mg Oral BID  . theophylline  200 mg Oral Daily  . vancomycin  125 mg Oral Q6H  . vitamin B-12  1,000 mcg Oral Daily  . vitamin C  250 mg Oral BID   Continuous Infusions:   Assessment/Plan:  1. C. difficile colitis continue to have significant diarrhea continue oral vancomycin 2. Urinary retention patient may need a Foley placement 3. Sepsis with Streptococcus pneumonia, Staph Hominus is likely a contamination.. Now has C. difficile on oral vancomycin and IV ceftriaxone will use finished a course for the antibiotic 4. C. difficile colitis oral vancomycin 5. Acute respiratory failure with hypoxia and hypercapnia.  Patient required BiPAP in presentation.   Slowly oxygen requirement is improving continue current therapy currently on 3 L of oxygen 6. COPD exacerbation with poor air entry.  Continue oral prednisone 7. anasarca due to low blood pressure I will discontinue IV Lasix 8. paroxysmal atrial fibrillation on amiodarone and Eliquis and digoxin and Cardizem 9. Anxiety depression and restless leg syndrome.  Continue psychiatric medication for sleep as well as Ambien 10. Morbid obesity with a BMI of 46.43 11. Weakness.  Appreciate physical therapy consultation.  Patient interested in rehab. 12.  Hypotension increased dose of midodrine  Code Status:     Code Status Orders  (From admission, onward)         Start     Ordered   03/05/18 1337  Do not attempt resuscitation (DNR)  Continuous    Question Answer Comment  In the event of cardiac or respiratory ARREST Do not call a "code blue"    In the event of cardiac or respiratory ARREST Do not perform Intubation, CPR, defibrillation or ACLS   In the event of cardiac or respiratory ARREST Use medication by any route, position, wound care, and other measures to relive pain and suffering. May use oxygen, suction and manual treatment of airway obstruction as needed for comfort.   Comments nurse may pronounce      03/05/18 1338        Code Status History    Date Active Date Inactive Code Status Order ID Comments User Context   02/19/2018 1709 02/26/2018 1634 DNR [id number]  Loletha Grayer, MD ED   12/03/2017 1258 12/06/2017 1847 DNR [id number]  Gladstone Lighter, MD Inpatient   10/24/2017 1844 10/26/2017 1623 DNR [id number]  Vaughan Basta, MD Inpatient   09/23/2015 0018 09/23/2015 1823 DNR [id number]  Lance Coon, MD Inpatient    Advance Directive Documentation     Most Recent Value  Type of Advance Directive  Out of facility DNR (pink MOST or yellow form)  Pre-existing out of facility DNR order (yellow form or pink MOST form)  -  "MOST" Form in Place?  -     Family Communication: Tried to call the daughter but unable to leave a message on her cell phone. Disposition Plan: Hopefully over the weekend can go to [place] care  Consultants:  Critical care team  Antibiotics:  Rocephin while here probably flipped over the Levaquin upon getting out of the hospital.   Patient needs to remain inpatient due to severe persistent severe diarrhea now has urinary retention also has hypotension she cannot be discharged and is condition   Time spent: 27 minutes  Jefrey Raburn [facility]

## 2018-03-18 LAB — CBC
HCT: 34.7 % — ABNORMAL LOW (ref 36.0–46.0)
Hemoglobin: 11.4 g/dL — ABNORMAL LOW (ref 12.0–15.0)
MCH: 29.5 pg (ref 26.0–34.0)
MCHC: 32.9 g/dL (ref 30.0–36.0)
MCV: 89.7 fL (ref 80.0–100.0)
PLATELETS: 181 10*3/uL (ref 150–400)
RBC: 3.87 MIL/uL (ref 3.87–5.11)
RDW: 14.5 % (ref 11.5–15.5)
WBC: 6.5 10*3/uL (ref 4.0–10.5)
nRBC: 0 % (ref 0.0–0.2)

## 2018-03-18 LAB — GLUCOSE, CAPILLARY: Glucose-Capillary: 113 mg/dL — ABNORMAL HIGH (ref 70–99)

## 2018-03-18 LAB — CREATININE, SERUM: CREATININE: 0.62 mg/dL (ref 0.44–1.00)

## 2018-03-18 MED ORDER — SODIUM CHLORIDE 0.9% FLUSH
3.0000 mL | INTRAVENOUS | Status: DC | PRN
  Administered 2018-03-18 (×2): 3 mL via INTRAVENOUS
  Filled 2018-03-18 (×2): qty 3

## 2018-03-18 MED ORDER — FUROSEMIDE 10 MG/ML IJ SOLN
40.0000 mg | Freq: Once | INTRAMUSCULAR | Status: AC
  Administered 2018-03-18: 12:00:00 40 mg via INTRAVENOUS
  Filled 2018-03-18: qty 4

## 2018-03-18 MED ORDER — ALBUMIN HUMAN 5 % IV SOLN
12.5000 g | Freq: Once | INTRAVENOUS | Status: AC
  Administered 2018-03-18: 12:00:00 12.5 g via INTRAVENOUS
  Filled 2018-03-18: qty 250

## 2018-03-18 MED ORDER — SODIUM CHLORIDE 0.9% FLUSH
3.0000 mL | Freq: Two times a day (BID) | INTRAVENOUS | Status: DC
  Administered 2018-03-18 – 2018-03-20 (×5): 3 mL via INTRAVENOUS

## 2018-03-18 NOTE — Progress Notes (Signed)
Pt has has 1 loose stool today. Pt reported x 1 felt like she needed to go but could not. Pt reports feeling anxious with multiple nonspecfic requests. Pt voided large amount when purewick placed with bladder scan not indicated. Lasix IV x 1. Generalized moderate to large amount edema. Son in. Eating well;drinking well. VSS.

## 2018-03-18 NOTE — Progress Notes (Signed)
Patient ID: Vicki Morales, female   DOB: 24-Jan-1941, 77 y.o.   MRN: [id number]   [facility] PROGRESS NOTE  Vicki Morales:[id number] DOB: 08/06/1940 DOA: 03/05/2018 PCP: Birdie Sons, MD  HPI/Subjective:   Less diarrhea today.  Last BM was last night.  No abdominal pain.  No trouble breathing.   Objective: Vitals:   03/18/18 0944 03/18/18 0949  BP: (!) 141/57   Pulse: 97 97  Resp: 20   Temp: (!) 97.5 F (36.4 C)   SpO2: 96%     Filed Weights   03/15/18 0428 03/17/18 0339 03/18/18 0650  Weight: 120.7 kg 122.2 kg 123.2 kg    ROS: Review of Systems  Constitutional: Negative for chills, fever, malaise/fatigue and weight loss.  HENT: Negative for hearing loss.   Eyes: Negative for blurred vision, double vision and photophobia.  Respiratory: Negative for cough, hemoptysis and shortness of breath.   Cardiovascular: Positive for leg swelling. Negative for chest pain, palpitations and orthopnea.  Gastrointestinal: Negative for abdominal pain, constipation, diarrhea, nausea and vomiting.  Genitourinary: Negative for dysuria and urgency.  Musculoskeletal: Negative for joint pain, myalgias and neck pain.  Skin: Negative for rash.  Neurological: Negative for dizziness, focal weakness, seizures, weakness and headaches.  Psychiatric/Behavioral: Negative for memory loss. The patient does not have insomnia.    Exam: Physical Exam  HENT:  Nose: No mucosal edema.  Mouth/Throat: No oropharyngeal exudate or posterior oropharyngeal edema.  Eyes: Pupils are equal, round, and reactive to light. Conjunctivae, EOM and lids are normal.  Neck: No JVD present. Carotid bruit is not present. No edema present. No thyroid mass and no thyromegaly present.  Cardiovascular: S1 normal and S2 normal. Exam reveals no gallop.  No murmur heard. Pulses:      Dorsalis pedis pulses are 2+ on the right side, and 2+ on the left side.  Respiratory: No respiratory distress. She has decreased breath  sounds in the right lower field and the left lower field. She has wheezes in the right lower field and the left lower field. She has no rhonchi. She has no rales.  GI: Soft. Bowel sounds are normal. There is no tenderness.  Musculoskeletal: She exhibits edema.       Right ankle: She exhibits swelling.       Left ankle: She exhibits swelling.  Lymphadenopathy:    She has no cervical adenopathy.  Neurological: She is alert. No cranial nerve deficit.  Skin: Skin is warm. No rash noted. Nails show no clubbing.  Psychiatric: She has a normal mood and affect.  Pressure  Patient has generalized anasarca more swelling in the legs.  Data Reviewed: Basic Metabolic Panel: Recent Labs  Lab 03/12/18 0446 03/13/18 0544 03/15/18 0622 03/16/18 0433 03/17/18 1158 03/18/18 0527  NA 137 133*  --  136 131*  --   K 4.5 4.9  --  3.9 4.0  --   CL 94* 92*  --  89* 87*  --   CO2 36* 37*  --  38* 34*  --   GLUCOSE 97 112*  --  107* 168*  --   BUN 18 22  --  35* 32*  --   CREATININE 0.73 0.74 0.82 0.78 0.79 0.62  CALCIUM 8.5* 8.1*  --  8.2* 8.2*  --    Liver Function Tests: No results for input(s): AST, ALT, ALKPHOS, BILITOT, PROT, ALBUMIN in the last 168 hours. CBC: Recent Labs  Lab 03/12/18 0446 03/15/18 0622 03/18/18 0527  WBC  15.8* 5.8 6.5  HGB 12.4 11.5* 11.4*  HCT 38.1 36.7 34.7*  MCV 91.3 94.6 89.7  PLT 168 143* 181   Cardiac Enzymes: No results for input(s): CKTOTAL, CKMB, CKMBINDEX, TROPONINI in the last 168 hours. BNP (last 3 results) Recent Labs    03/05/18 1124  BNP 213.0*    CBG: Recent Labs  Lab 03/16/18 1140 03/16/18 1701 03/17/18 0741 03/17/18 1136 03/18/18 0804  GLUCAP 85 145* 114* 126* 113*    Recent Results (from the past 240 hour(s))  CULTURE, BLOOD (ROUTINE X 2) w Reflex to ID Panel     Status: None   Collection Time: 03/09/18  2:23 PM  Result Value Ref Range Status   Specimen Description BLOOD BLOOD LEFT HAND  Final   Special Requests   Final     BOTTLES DRAWN AEROBIC AND ANAEROBIC Blood Culture adequate volume   Culture   Final    NO GROWTH 5 DAYS Performed at [facility], [address] [place], [place] [address]    Report Status 03/14/2018 FINAL  Final  CULTURE, BLOOD (ROUTINE X 2) w Reflex to ID Panel     Status: None   Collection Time: 03/09/18  2:34 PM  Result Value Ref Range Status   Specimen Description BLOOD BLOOD RIGHT HAND  Final   Special Requests   Final    BOTTLES DRAWN AEROBIC AND ANAEROBIC Blood Culture adequate volume   Culture   Final    NO GROWTH 5 DAYS Performed at [facility], [address], [place], [place] [address]    Report Status 03/14/2018 FINAL  Final  Body fluid culture     Status: None   Collection Time: 03/13/18  4:56 PM  Result Value Ref Range Status   Specimen Description   Final    PLEURAL Performed at [facility], [address], [place], [place] [address]    Special Requests   Final    NONE Performed at [facility], [address] [place], [place] [address]    Gram Stain   Final    FEW WBC PRESENT, PREDOMINANTLY MONONUCLEAR NO ORGANISMS SEEN    Culture   Final    NO GROWTH 3 DAYS Performed at [facility], [place] [address] [place], [place] [address]    Report Status 03/17/2018 FINAL  Final  C difficile quick scan w PCR reflex     Status: Abnormal   Collection Time: 03/15/18  4:51 AM  Result Value Ref Range Status   C Diff antigen POSITIVE (A) NEGATIVE Final   C Diff toxin POSITIVE (A) NEGATIVE Final   C Diff interpretation Toxin producing C. difficile detected.  Final    Comment: CRITICAL RESULT CALLED TO, READ BACK BY AND VERIFIED WITH:  Kyla Balzarine AT 5102 03/15/18 SDR Performed at [facility], [address] [place], [place] [address]       Scheduled Meds: . amiodarone  200 mg Oral BID  . apixaban  5 mg Oral BID  . aspirin EC  81 mg Oral Daily  . budesonide (PULMICORT) nebulizer solution  0.5 mg  Nebulization BID  . busPIRone  10 mg Oral TID  . digoxin  0.0625 mg Oral Daily  . divalproex  250 mg Oral BID  . famotidine  20 mg Oral Daily  . fluticasone  1 spray Each Nare BID  . gabapentin  100 mg Oral BID WC  . gabapentin  600 mg Oral QHS  . ipratropium-albuterol  3 mL Nebulization Q6H  . loratadine  10 mg Oral Daily  . magnesium oxide  400 mg Oral BID  . Melatonin  10 mg Oral QHS  . methylPREDNISolone (SOLU-MEDROL) injection  40 mg Intravenous Daily  . midodrine  10 mg Oral TID WC  . montelukast  10 mg Oral QHS  . multivitamin with minerals  1 tablet Oral Daily  . omega-3 acid ethyl esters  1 g Oral Daily  . oxybutynin  10 mg Oral QHS  . protein supplement shake  11 oz Oral BID BM  . risperiDONE  1 mg Oral QHS  . saccharomyces boulardii  250 mg Oral BID  . sodium chloride flush  3 mL Intravenous Q12H  . theophylline  200 mg Oral Daily  . vancomycin  125 mg Oral Q6H  . vitamin B-12  1,000 mcg Oral Daily  . vitamin C  250 mg Oral BID   Continuous Infusions:   Assessment/Plan:  1. C. difficile colitis ; continue vancomycin for 14 days, diarrhea decreased.  Continue probiotics, vancomycin. 2. Urinary retention ; improved,t 3. Sepsis with Streptococcus pneumonia, Staph Hominus is likely a contamination.. Now has C. difficile on oral vancomycin/ 4. C. difficile colitis oral vancomycin 5. Acute respiratory failure with hypoxia and hypercapnia.  Patient required BiPAP in presentation.   Slowly oxygen requirement is improving continue current therapy currently on 3 L of oxygen 6. COPD exacerbation with poor air entry.  Continue oral prednisone 7. anasarca due to low blood pressure; continue albumin, on midodrine, BP improved. 8. paroxysmal atrial fibrillation ;on amiodarone ,Eliquis , digoxin and Cardizem  9. Anxiety depression ,restless leg syndrome.  Continue psychiatric medication for sleep as well as Ambien 10. Morbid obesity with a BMI of 46.43 11. Weakness.  Appreciate  physical therapy consultation.  Patient interested in rehab.  Likely discharge on Monday. 12.  Hypotension ';increased dose of midodrine, hypotension is improved.  Code Status:     Code Status Orders  (From admission, onward)         Start     Ordered   03/05/18 1337  Do not attempt resuscitation (DNR)  Continuous    Question Answer Comment  In the event of cardiac or respiratory ARREST Do not call a "code blue"   In the event of cardiac or respiratory ARREST Do not perform Intubation, CPR, defibrillation or ACLS   In the event of cardiac or respiratory ARREST Use medication by any route, position, wound care, and other measures to relive pain and suffering. May use oxygen, suction and manual treatment of airway obstruction as needed for comfort.   Comments nurse may pronounce      03/05/18 1338        Code Status History    Date Active Date Inactive Code Status Order ID Comments User Context   02/19/2018 1709 02/26/2018 1634 DNR [id number]  Loletha Grayer, MD ED   12/03/2017 1258 12/06/2017 1847 DNR [id number]  Gladstone Lighter, MD Inpatient   10/24/2017 1844 10/26/2017 1623 DNR [id number]  Vaughan Basta, MD Inpatient   09/23/2015 0018 09/23/2015 1823 DNR [id number]  Lance Coon, MD Inpatient    Advance Directive Documentation     Most Recent Value  Type of Advance Directive  Out of facility DNR (pink MOST or yellow form)  Pre-existing out of facility DNR order (yellow form or pink MOST form)  -  "MOST" Form in Place?  -     Family Communication: Tried to call the daughter but unable to  leave a message on her cell phone. Disposition Plan: Hopefully over the weekend can go to [place] care  Consultants:  Critical care team  Antibiotics:  Rocephin while here probably flipped over the [place] upon getting out of the hospital.   Patient needs to remain inpatient due to severe persistent severe diarrhea now has urinary retention also has hypotension she cannot  be discharged and is condition   Time spent: 27 minutes  [place]

## 2018-03-18 NOTE — Plan of Care (Signed)

## 2018-03-19 LAB — GLUCOSE, CAPILLARY: Glucose-Capillary: 103 mg/dL — ABNORMAL HIGH (ref 70–99)

## 2018-03-19 NOTE — Progress Notes (Signed)
Patient ID: Vicki Morales, female   DOB: 07-30-1940, 77 y.o.   MRN: [id number]   [employer] PROGRESS NOTE  Vicki Morales:[id number] DOB: 22-Jul-1940 DOA: 03/05/2018 PCP: Birdie Sons, MD  HPI/Subjective:   Overall feeling well.   Objective: Vitals:   03/19/18 0948 03/19/18 1300  BP: (!) 144/69   Pulse: 80   Resp:    Temp: 97.7 F (36.5 C)   SpO2: 98% 96%    Filed Weights   03/17/18 0339 03/18/18 0650 03/19/18 0500  Weight: 122.2 kg 123.2 kg 121.7 kg    ROS: Review of Systems  Constitutional: Negative for chills, fever, malaise/fatigue and weight loss.  HENT: Negative for hearing loss.   Eyes: Negative for blurred vision, double vision and photophobia.  Respiratory: Negative for cough, hemoptysis and shortness of breath.   Cardiovascular: Positive for leg swelling. Negative for chest pain, palpitations and orthopnea.  Gastrointestinal: Negative for abdominal pain, constipation, diarrhea, nausea and vomiting.  Genitourinary: Negative for dysuria and urgency.  Musculoskeletal: Negative for joint pain, myalgias and neck pain.  Skin: Negative for rash.  Neurological: Negative for dizziness, focal weakness, seizures, weakness and headaches.  Psychiatric/Behavioral: Negative for memory loss. The patient does not have insomnia.    Exam: Physical Exam  HENT:  Nose: No mucosal edema.  Mouth/Throat: No oropharyngeal exudate or posterior oropharyngeal edema.  Eyes: Pupils are equal, round, and reactive to light. Conjunctivae, EOM and lids are normal.  Neck: No JVD present. Carotid bruit is not present. No edema present. No thyroid mass and no thyromegaly present.  Cardiovascular: S1 normal and S2 normal. Exam reveals no gallop.  No murmur heard. Pulses:      Dorsalis pedis pulses are 2+ on the right side, and 2+ on the left side.  Respiratory: No respiratory distress. She has decreased breath sounds in the right lower field and the left lower field. She has wheezes  in the right lower field and the left lower field. She has no rhonchi. She has no rales.  GI: Soft. Bowel sounds are normal. There is no tenderness.  Musculoskeletal: She exhibits edema.       Right ankle: She exhibits swelling.       Left ankle: She exhibits swelling.  Lymphadenopathy:    She has no cervical adenopathy.  Neurological: She is alert. No cranial nerve deficit.  Skin: Skin is warm. No rash noted. Nails show no clubbing.  Psychiatric: She has a normal mood and affect.  Pressure  Patient has generalized anasarca more swelling in the legs.  Data Reviewed: Basic Metabolic Panel: Recent Labs  Lab 03/13/18 0544 03/15/18 0622 03/16/18 0433 03/17/18 1158 03/18/18 0527  NA 133*  --  136 131*  --   K 4.9  --  3.9 4.0  --   CL 92*  --  89* 87*  --   CO2 37*  --  38* 34*  --   GLUCOSE 112*  --  107* 168*  --   BUN 22  --  35* 32*  --   CREATININE 0.74 0.82 0.78 0.79 0.62  CALCIUM 8.1*  --  8.2* 8.2*  --    Liver Function Tests: No results for input(s): AST, ALT, ALKPHOS, BILITOT, PROT, ALBUMIN in the last 168 hours. CBC: Recent Labs  Lab 03/15/18 0622 03/18/18 0527  WBC 5.8 6.5  HGB 11.5* 11.4*  HCT 36.7 34.7*  MCV 94.6 89.7  PLT 143* 181   Cardiac Enzymes: No results for input(s): CKTOTAL,  CKMB, CKMBINDEX, TROPONINI in the last 168 hours. BNP (last 3 results) Recent Labs    03/05/18 1124  BNP 213.0*    CBG: Recent Labs  Lab 03/16/18 1701 03/17/18 0741 03/17/18 1136 03/18/18 0804 03/19/18 0733  GLUCAP 145* 114* 126* 113* 103*    Recent Results (from the past 240 hour(s))  CULTURE, BLOOD (ROUTINE X 2) w Reflex to ID Panel     Status: None   Collection Time: 03/09/18  2:23 PM  Result Value Ref Range Status   Specimen Description BLOOD BLOOD LEFT HAND  Final   Special Requests   Final    BOTTLES DRAWN AEROBIC AND ANAEROBIC Blood Culture adequate volume   Culture   Final    NO GROWTH 5 DAYS Performed at [facility], [address] [place], [place] [address]    Report Status 03/14/2018 FINAL  Final  CULTURE, BLOOD (ROUTINE X 2) w Reflex to ID Panel     Status: None   Collection Time: 03/09/18  2:34 PM  Result Value Ref Range Status   Specimen Description BLOOD BLOOD RIGHT HAND  Final   Special Requests   Final    BOTTLES DRAWN AEROBIC AND ANAEROBIC Blood Culture adequate volume   Culture   Final    NO GROWTH 5 DAYS Performed at [facility], [address] [place], [place] [address]    Report Status 03/14/2018 FINAL  Final  Body fluid culture     Status: None   Collection Time: 03/13/18  4:56 PM  Result Value Ref Range Status   Specimen Description   Final    PLEURAL Performed at [facility], [address], [place], [place] [address]    Special Requests   Final    NONE Performed at [facility], [address] [place], [place] [address]    Gram Stain   Final    FEW WBC PRESENT, PREDOMINANTLY MONONUCLEAR NO ORGANISMS SEEN    Culture   Final    NO GROWTH 3 DAYS Performed at [facility], [place] [address] [place], [place] [address]    Report Status 03/17/2018 FINAL  Final  C difficile quick scan w PCR reflex     Status: Abnormal   Collection Time: 03/15/18  4:51 AM  Result Value Ref Range Status   C Diff antigen POSITIVE (A) NEGATIVE Final   C Diff toxin POSITIVE (A) NEGATIVE Final   C Diff interpretation Toxin producing C. difficile detected.  Final    Comment: CRITICAL RESULT CALLED TO, READ BACK BY AND VERIFIED WITH:  Kyla Balzarine AT 7253 03/15/18 SDR Performed at [facility], [address] [place], [place] [address]   CULTURE, BLOOD (ROUTINE X 2) w Reflex to ID Panel     Status: None (Preliminary result)   Collection Time: 03/18/18 12:34 PM  Result Value Ref Range Status   Specimen Description BLOOD L HAND  Final   Special Requests   Final    BOTTLES DRAWN AEROBIC AND ANAEROBIC Blood Culture adequate volume   Culture   Final    NO GROWTH < 24  HOURS Performed at [facility] [address], [place], [place] [address]    Report Status PENDING  Incomplete  CULTURE, BLOOD (ROUTINE X 2) w Reflex to ID Panel     Status: None (Preliminary result)   Collection Time: 03/18/18 12:43 PM  Result Value Ref Range Status   Specimen Description BLOOD R AC  Final  Special Requests   Final    BOTTLES DRAWN AEROBIC AND ANAEROBIC Blood Culture adequate volume   Culture   Final    NO GROWTH < 24 HOURS Performed at [facility], [address] [place], [place] [address]    Report Status PENDING  Incomplete      Scheduled Meds: . amiodarone  200 mg Oral BID  . apixaban  5 mg Oral BID  . aspirin EC  81 mg Oral Daily  . budesonide (PULMICORT) nebulizer solution  0.5 mg Nebulization BID  . busPIRone  10 mg Oral TID  . digoxin  0.0625 mg Oral Daily  . divalproex  250 mg Oral BID  . famotidine  20 mg Oral Daily  . fluticasone  1 spray Each Nare BID  . gabapentin  100 mg Oral BID WC  . gabapentin  600 mg Oral QHS  . ipratropium-albuterol  3 mL Nebulization Q6H  . loratadine  10 mg Oral Daily  . magnesium oxide  400 mg Oral BID  . Melatonin  10 mg Oral QHS  . methylPREDNISolone (SOLU-MEDROL) injection  40 mg Intravenous Daily  . midodrine  10 mg Oral TID WC  . montelukast  10 mg Oral QHS  . multivitamin with minerals  1 tablet Oral Daily  . omega-3 acid ethyl esters  1 g Oral Daily  . oxybutynin  10 mg Oral QHS  . protein supplement shake  11 oz Oral BID BM  . risperiDONE  1 mg Oral QHS  . saccharomyces boulardii  250 mg Oral BID  . sodium chloride flush  3 mL Intravenous Q12H  . theophylline  200 mg Oral Daily  . vancomycin  125 mg Oral Q6H  . vitamin B-12  1,000 mcg Oral Daily  . vitamin C  250 mg Oral BID   Continuous Infusions:   Assessment/Plan:  1. C. difficile colitis ; continue vancomycin for 14 days, diarrhea decreased.  Continue probiotics, vancomycin. 2. Urinary retention ; improved, 3. Sepsis  with Streptococcus pneumonia, Staph Hominus is likely a contamination.. Now has C. difficile on oral vancomycin/ 4. C. difficile colitis oral vancomycin 5. Acute respiratory failure with hypoxia and hypercapnia.  Patient required BiPAP in presentation.   Slowly oxygen requirement is improving continue current therapy currently on 3 L of oxygen 6. COPD exacerbation with poor air entry.  Continue oral prednisone 7. anasarca due to low blood pressure; continue albumin, on midodrine, BP improved. 8. paroxysmal atrial fibrillation ;on amiodarone ,Eliquis , digoxin and Cardizem  9. Anxiety depression ,restless leg syndrome.  Continue psychiatric medication for sleep as well as Ambien 10. Morbid obesity with a BMI of 46.43 Weakness.  Appreciate physical therapy consultation.  Patient interested in rehab.  Likely discharge tomorrow. #12.[name] Kitchen COPD, History of thoracocentesis on the right side removed 750 mL of amber-colored fluid, fluid cultures continue theophylline, Solu-Medrol, duo nebs, Flonase,. Hypotension. resolved with Midodrin. Code Status:     Code Status Orders  (From admission, onward)         Start     Ordered   03/05/18 1337  Do not attempt resuscitation (DNR)  Continuous    Question Answer Comment  In the event of cardiac or respiratory ARREST Do not call a "code blue"   In the event of cardiac or respiratory ARREST Do not perform Intubation, CPR, defibrillation or ACLS   In the event of cardiac or respiratory ARREST Use medication by any route, position, wound care, and other measures to relive pain  and suffering. May use oxygen, suction and manual treatment of airway obstruction as needed for comfort.   Comments nurse may pronounce      03/05/18 1338        Code Status History    Date Active Date Inactive Code Status Order ID Comments User Context   02/19/2018 1709 02/26/2018 1634 DNR [id number]  Loletha Grayer, MD ED   12/03/2017 1258 12/06/2017 1847 DNR [id number]  Gladstone Lighter, MD Inpatient   10/24/2017 1844 10/26/2017 1623 DNR [id number]  Vaughan Basta, MD Inpatient   09/23/2015 0018 09/23/2015 1823 DNR [id number]  Lance Coon, MD Inpatient    Advance Directive Documentation     Most Recent Value  Type of Advance Directive  Out of facility DNR (pink MOST or yellow form)  Pre-existing out of facility DNR order (yellow form or pink MOST form)  -  "MOST" Form in Place?  -     Family Communication: Discussed with patient. Disposition Plan: Likely discharge tomorrow to [facility].  Consultants:  Critical care team  Antibiotics: Vancomycin.  Time spent: 27 minutes  [place]

## 2018-03-20 DIAGNOSIS — F203 Undifferentiated schizophrenia: Secondary | ICD-10-CM | POA: Diagnosis not present

## 2018-03-20 DIAGNOSIS — M6281 Muscle weakness (generalized): Secondary | ICD-10-CM | POA: Diagnosis not present

## 2018-03-20 DIAGNOSIS — Z6841 Body Mass Index (BMI) 40.0 and over, adult: Secondary | ICD-10-CM | POA: Diagnosis not present

## 2018-03-20 DIAGNOSIS — Z515 Encounter for palliative care: Secondary | ICD-10-CM | POA: Diagnosis not present

## 2018-03-20 DIAGNOSIS — A403 Sepsis due to Streptococcus pneumoniae: Secondary | ICD-10-CM | POA: Diagnosis not present

## 2018-03-20 DIAGNOSIS — J45998 Other asthma: Secondary | ICD-10-CM | POA: Diagnosis not present

## 2018-03-20 DIAGNOSIS — A0472 Enterocolitis due to Clostridium difficile, not specified as recurrent: Secondary | ICD-10-CM | POA: Diagnosis not present

## 2018-03-20 DIAGNOSIS — E7849 Other hyperlipidemia: Secondary | ICD-10-CM | POA: Diagnosis not present

## 2018-03-20 DIAGNOSIS — G4734 Idiopathic sleep related nonobstructive alveolar hypoventilation: Secondary | ICD-10-CM | POA: Diagnosis not present

## 2018-03-20 DIAGNOSIS — F331 Major depressive disorder, recurrent, moderate: Secondary | ICD-10-CM | POA: Diagnosis not present

## 2018-03-20 DIAGNOSIS — J9601 Acute respiratory failure with hypoxia: Secondary | ICD-10-CM | POA: Diagnosis not present

## 2018-03-20 DIAGNOSIS — J9602 Acute respiratory failure with hypercapnia: Secondary | ICD-10-CM | POA: Diagnosis not present

## 2018-03-20 DIAGNOSIS — J441 Chronic obstructive pulmonary disease with (acute) exacerbation: Secondary | ICD-10-CM | POA: Diagnosis not present

## 2018-03-20 DIAGNOSIS — Z7401 Bed confinement status: Secondary | ICD-10-CM | POA: Diagnosis not present

## 2018-03-20 DIAGNOSIS — E1140 Type 2 diabetes mellitus with diabetic neuropathy, unspecified: Secondary | ICD-10-CM | POA: Diagnosis not present

## 2018-03-20 DIAGNOSIS — R112 Nausea with vomiting, unspecified: Secondary | ICD-10-CM | POA: Diagnosis not present

## 2018-03-20 DIAGNOSIS — J189 Pneumonia, unspecified organism: Secondary | ICD-10-CM | POA: Diagnosis not present

## 2018-03-20 DIAGNOSIS — J449 Chronic obstructive pulmonary disease, unspecified: Secondary | ICD-10-CM | POA: Diagnosis not present

## 2018-03-20 LAB — GLUCOSE, CAPILLARY: Glucose-Capillary: 93 mg/dL (ref 70–99)

## 2018-03-20 MED ORDER — MAGNESIUM OXIDE 400 (241.3 MG) MG PO TABS: 400.0000 mg | ORAL_TABLET | Freq: Two times a day (BID) | ORAL | 0 refills | Status: DC

## 2018-03-20 MED ORDER — ALPRAZOLAM 0.5 MG PO TABS: 0.5000 mg | ORAL_TABLET | Freq: Every evening | ORAL | 0 refills | Status: DC | PRN

## 2018-03-20 MED ORDER — PREMIER PROTEIN SHAKE: 11.0000 [oz_av] | Freq: Two times a day (BID) | ORAL | 0 refills | Status: DC

## 2018-03-20 MED ORDER — GABAPENTIN 600 MG PO TABS: 600.0000 mg | ORAL_TABLET | Freq: Every day | ORAL | 0 refills | Status: DC

## 2018-03-20 MED ORDER — BUDESONIDE 0.5 MG/2ML IN SUSP: 0.5000 mg | Freq: Two times a day (BID) | RESPIRATORY_TRACT | 12 refills | Status: DC

## 2018-03-20 MED ORDER — GABAPENTIN 100 MG PO CAPS: 100.0000 mg | ORAL_CAPSULE | Freq: Two times a day (BID) | ORAL | 0 refills | Status: DC

## 2018-03-20 MED ORDER — ASCORBIC ACID 250 MG PO TABS: 250.0000 mg | ORAL_TABLET | Freq: Two times a day (BID) | ORAL | 0 refills | Status: DC

## 2018-03-20 MED ORDER — VANCOMYCIN 50 MG/ML ORAL SOLUTION: 125.0000 mg | Freq: Four times a day (QID) | ORAL | 0 refills | Status: DC

## 2018-03-20 MED ORDER — MIDODRINE HCL 10 MG PO TABS: 10.0000 mg | ORAL_TABLET | Freq: Three times a day (TID) | ORAL | 0 refills | Status: DC

## 2018-03-20 MED ORDER — HYDRALAZINE HCL 10 MG PO TABS: 10.0000 mg | ORAL_TABLET | Freq: Two times a day (BID) | ORAL | 0 refills | Status: DC

## 2018-03-20 MED ORDER — OMEGA-3-ACID ETHYL ESTERS 1 G PO CAPS: 1.0000 g | ORAL_CAPSULE | Freq: Every day | ORAL | 0 refills | Status: DC

## 2018-03-20 NOTE — Clinical Social Work Note (Signed)
Patient is medically ready for discharge today. CSW notified patient of discharge. Patient states that she will need to be transported by EMS and her son will follow. CSW notified Claiborne Billings at [facility] that patient will discharge today. Patient will be transported by EMS. RN to call report and call for transport.   [place], [place]

## 2018-03-20 NOTE — Care Management Important Message (Signed)
Important Message  Patient Details  Name: Vicki Morales MRN: [id number] Date of Birth: Jun 30, 1940   Medicare Important Message Given:  Yes    Shelbie Ammons, RN 03/20/2018, 12:42 PM

## 2018-03-20 NOTE — Progress Notes (Signed)
Patient discharging to [facility]. Report called to Beryl Meager RN at facility. Will call EMS for transport.

## 2018-03-20 NOTE — Discharge Summary (Signed)
Vicki Morales, is a 77 y.o. female  DOB Oct 28, 1940  MRN [id number].  Admission date:  03/05/2018  Admitting Physician  Epifanio Lesches, MD  Discharge Date:  03/20/2018   Primary MD  Birdie Sons, MD  Recommendations for primary care physician for things to follow:   Follow with PCP in 1 week   Admission Diagnosis  Healthcare-associated pneumonia [J18.9] Acute respiratory failure with hypoxia and hypercapnia (HCC) [J96.01, J96.02]   Discharge Diagnosis  Healthcare-associated pneumonia [J18.9] Acute respiratory failure with hypoxia and hypercapnia (HCC) [J96.01, J96.02]    Active Problems:   Acute and chronic respiratory failure with hypercapnia (HCC)      Past Medical History:  Diagnosis Date  . Anxiety   . Arthritis   . Cataract    right eye  . COPD (chronic obstructive pulmonary disease) ([place])   . Depression   . Dyspnea    DOE  . Dysrhythmia   . Edema    FEET/ANKLES  . GERD (gastroesophageal reflux disease)   . H/O wheezing   . History of hiatal hernia   . HOH (hard of hearing)   . Hypertension   . Incontinence of urine in female   . Orthopnea   . Pain    CHRONIC KNEE  . Pain    CHRONIC KNEE  . Paranoid disorder ([place])   . RLS (restless legs syndrome)   . Tremors of nervous system    HANDS    Past Surgical History:  Procedure Laterality Date  . ABDOMINAL HYSTERECTOMY  1988  . APPENDECTOMY  1988  . CARDIAC CATHETERIZATION    . CATARACT EXTRACTION W/PHACO Right 07/14/2016   Procedure: CATARACT EXTRACTION PHACO AND INTRAOCULAR LENS PLACEMENT ([place]) anterior vitrectomy;  Surgeon: Estill Cotta, MD;  Location: [facility] ORS;  Service: Ophthalmology;  Laterality: Right;  [place] 02:40 AP% 24.4 CDE 59.98 Fluid pack # [id number]  . CATARACT EXTRACTION W/PHACO Left 02/16/2017   Procedure: CATARACT  EXTRACTION PHACO AND INTRAOCULAR LENS PLACEMENT (IOC);  Surgeon: Estill Cotta, MD;  Location: [facility] ORS;  Service: Ophthalmology;  Laterality: Left;  Lot # [id number] H [place]: 01:11.8 AP%: 23.5 CDE: 32.09   . ESOPHAGOGASTRODUODENOSCOPY N/A 11/11/2014   Procedure: ESOPHAGOGASTRODUODENOSCOPY (EGD);  Surgeon: Josefine Class, MD;  Location: [facility] ENDOSCOPY;  Service: Endoscopy;  Laterality: N/A;  . TONSILLECTOMY AND ADENOIDECTOMY    . TRIGGER FINGER RELEASE  2004       History of present illness and  Hospital Course:     Kindly see H&P for history of present illness and admission details, please review complete Labs, Consult reports and Test reports for all details in brief  HPI  from the history and physical done on the day of admission  77 year old female with history of COPD, chronic atrial fibrillation comes from [facility] with altered mental status, respiratory distress, patient was confused and short of breath.  ABG revealed high PCO2, patient started on BiPAP, admitted to stepdown unit.  Patient chest x-ray concerning for right-sided pneumonia and also O2 sats were 80% when EMS arrived as per history.  Hospital Course  #1 .acute on chronic respiratory failure with hypoxia, hypercapnia secondary to pneumonia: Patient initially started on BiPAP, admitted to stepdown, received bronchodilators, patient respiratory status improved, now on 3 L of oxygen, saturation 96% on 3 L.  And she is hemodynamically stable.  #2 .sepsis with streptococcal pneumonia, ; received Rocephin, patient WBC around 15.8 but decreased to 5.8.  rpt blood cultures have been negative.  Patient also  had elevated procalcitonin decreased with antibiotics.  Patient found to have right basilar consolidation, right pleural effusion, patient received Rocephin, Zithromax. #3 .[place] pleural effusion secondary to pneumonia: Patient had thoracocentesis by radiology on October 7, removed 750 mL of fluid.  Pleural fluid  cultures are negative for arterial growth. #4 C. difficile colitis, patient had diarrhea, C. difficile positive on October 9, started on vancomycin, patient to continue vancomycin 125 mg every 6 hours for total of 2 weeks, prescription is given for vancomycin to continue for 9 more days.  Continue vancomycin till October 22.   #5. mental status with metabolic encephalopathy on admission improved. 6.  Chronic atrial fibrillation, patient is on amiodarone, Eliquis, digoxin.  Heart rate has been stable around 88. #6 severe anxiety, mood disorder: Patient is on BuSpar, Xanax, Depakote. 7.  Diabetic neuropathy: Patient is on Neurontin 100 mg p.o. 2 tablets daily with meals, 600 mg daily at bedtime. 8.  COPD exacerbation: Improved, discharge back to [facility] with tapering course of prednisone, continue bronchodilators as per discharge medications. 9.  Hypotension: Improved, Midrin started on this admission and she is on midodrine 10 mg p.o. 3 times daily. The palliative care team, now patient is DNR. #10. hypoalbuminemia, anasarca: By nutritionist.  Started on Premier protein, multivitamins, vitamin C.  Prealbumin, Lasix in the hospital. #11. morbid obesity with BMI of 46.43.  Advised to lose weight and start physical therapy and participate in physical therapy. Discharge Condition: Stable Discharge diet dysphagia 3 diet with thin liquids.  Follow UP  Follow-up Information    Birdie Sons, MD. Schedule an appointment as soon as possible for a visit in 1 week(s).   Specialty:  Family Medicine Contact information: [address] [place] [address] [phone]             Discharge Instructions  and  Discharge Medications      Allergies as of 03/20/2018      Reactions   Arthrotec [diclofenac-misoprostol] Hives   Codeine Other (See Comments)   Headache    Diclofenac Other (See Comments), Hives   Pt unsure allergy   Hydrochlorothiazide Other (See  Comments)   Weakness    Penicillins Hives   Has patient had a PCN reaction causing immediate rash, facial/tongue/throat swelling, SOB or lightheadedness with hypotension: No Has patient had a PCN reaction causing severe rash involving mucus membranes or skin necrosis: No Has patient had a PCN reaction that required hospitalization: No Has patient had a PCN reaction occurring within the last 10 years: No If all of the above answers are "NO", then may proceed with Cephalosporin use.   Sulfa Antibiotics Hives   Tiotropium Bromide Monohydrate Other (See Comments)   Blurry vision    Tylenol [acetaminophen] Other (See Comments)   Doesn't work   Morphine Hives, Rash   Pantoprazole Rash      Medication List    STOP taking these medications   azithromycin 250 MG tablet Commonly known as:  ZITHROMAX   diltiazem 120 MG 24 hr capsule Commonly known as:  CARDIZEM CD   gabapentin 300 MG capsule Commonly known as:  NEURONTIN Replaced by:  gabapentin 600 MG tablet You also have another medication with the same name that you need to continue taking as instructed.   patiromer 8.4 g packet Commonly known as:  VELTASSA   predniSONE 10 MG (21) Tbpk tablet Commonly known as:  STERAPRED UNI-PAK 21 TAB   tiZANidine 4 MG tablet Commonly known  as:  ZANAFLEX   traMADol 50 MG tablet Commonly known as:  ULTRAM     TAKE these medications   acidophilus Caps capsule Take 1 capsule by mouth 3 (three) times daily with meals.   albuterol (2.5 MG/3ML) 0.083% nebulizer solution Commonly known as:  PROVENTIL Take 3 mLs (2.5 mg total) by nebulization 2 (two) times daily as needed for wheezing or shortness of breath.   ALPRAZolam 0.5 MG tablet Commonly known as:  XANAX Take 1 tablet (0.5 mg total) by mouth at bedtime as needed for anxiety.   amiodarone 200 MG tablet Commonly known as:  PACERONE Take 1 tablet (200 mg total) by mouth 2 (two) times daily.   apixaban 5 MG Tabs tablet Commonly known  as:  ELIQUIS Take 1 tablet (5 mg total) by mouth 2 (two) times daily.   ascorbic acid 250 MG tablet Commonly known as:  VITAMIN C Take 1 tablet (250 mg total) by mouth 2 (two) times daily.   aspirin 81 MG EC tablet Take 1 tablet (81 mg total) by mouth daily.   budesonide 0.5 MG/2ML nebulizer solution Commonly known as:  PULMICORT Take 2 mLs (0.5 mg total) by nebulization 2 (two) times daily.   busPIRone 10 MG tablet Commonly known as:  BUSPAR Take 1 tablet (10 mg total) by mouth 3 (three) times daily.   CALCIUM 600+D 600-400 MG-UNIT tablet Generic drug:  Calcium Carbonate-Vitamin D Take 1 tablet by mouth 2 (two) times daily.   Cranberry 250 MG Caps Take 1 capsule by mouth daily.   cyanocobalamin 1000 MCG tablet Take 1,000 mcg by mouth daily.   digoxin 0.125 MG tablet Commonly known as:  LANOXIN Take 0.5 tablets (0.0625 mg total) by mouth daily.   divalproex 250 MG 24 hr tablet Commonly known as:  DEPAKOTE ER Take 250 mg by mouth 2 (two) times daily. What changed:  Another medication with the same name was removed. Continue taking this medication, and follow the directions you see here.   feeding supplement (ENSURE ENLIVE) Liqd Take 237 mLs by mouth 2 (two) times daily between meals.   Fish Oil 1000 MG Caps Take 1,000 mg by mouth daily.   fluticasone 50 MCG/ACT nasal spray Commonly known as:  FLONASE Place 1 spray into both nostrils daily.   Fluticasone-Umeclidin-Vilant 100-62.5-25 MCG/INH Aepb Inhale 1 puff into the lungs daily.   gabapentin 100 MG capsule Commonly known as:  NEURONTIN Take 1 capsule (100 mg total) by mouth 2 (two) times daily with a meal. What changed:    See the new instructions.  Another medication with the same name was removed. Continue taking this medication, and follow the directions you see here.   gabapentin 600 MG tablet Commonly known as:  NEURONTIN Take 1 tablet (600 mg total) by mouth at bedtime. What changed:  You were  already taking a medication with the same name, and this prescription was added. Make sure you understand how and when to take each. Replaces:  gabapentin 300 MG capsule   guaiFENesin 600 MG 12 hr tablet Commonly known as:  MUCINEX Take 1 tablet (600 mg total) by mouth 2 (two) times daily.   hydrALAZINE 10 MG tablet Commonly known as:  APRESOLINE Take 1 tablet (10 mg total) by mouth 2 (two) times daily for 4 days. Hold if BP is <100/70   loperamide 2 MG capsule Commonly known as:  IMODIUM Take 1 capsule (2 mg total) by mouth as needed for diarrhea or loose stools.  loratadine 10 MG tablet Commonly known as:  CLARITIN Take 10 mg by mouth daily.   magnesium oxide 400 (241.3 Mg) MG tablet Commonly known as:  MAG-OX Take 1 tablet (400 mg total) by mouth 2 (two) times daily.   magnesium oxide 400 MG tablet Commonly known as:  MAG-OX Take 400 mg by mouth 2 (two) times daily.   Melatonin 10 MG Caps Take 10 mg by mouth at bedtime as needed (sleep).   midodrine 10 MG tablet Commonly known as:  PROAMATINE Take 1 tablet (10 mg total) by mouth 3 (three) times daily with meals.   montelukast 10 MG tablet Commonly known as:  SINGULAIR TAKE 1 TABLET(10 MG) BY MOUTH EVERY NIGHT What changed:  See the new instructions.   multivitamin with minerals Tabs tablet Take 1 tablet by mouth daily.   omega-3 acid ethyl esters 1 g capsule Commonly known as:  LOVAZA Take 1 capsule (1 g total) by mouth daily.   oxybutynin 10 MG 24 hr tablet Commonly known as:  DITROPAN-XL TAKE 1 TABLET BY MOUTH AT BEDTIME   Probiotic 250 MG Caps Take 1 capsule by mouth 3 times/day as needed-between meals & bedtime. What changed:  when to take this   protein supplement shake Liqd Commonly known as:  PREMIER PROTEIN Take 325 mLs (11 oz total) by mouth 2 (two) times daily between meals.   risperiDONE 1 MG tablet Commonly known as:  RISPERDAL Take 1 tablet (1 mg total) by mouth at bedtime.   theophylline  200 MG 24 hr capsule Commonly known as:  THEO-24 Take 1 capsule (200 mg total) by mouth daily.   traZODone 100 MG tablet Commonly known as:  DESYREL Take 1-2 tablets (100-200 mg total) by mouth at bedtime. What changed:  how much to take   vancomycin 50 mg/mL  oral solution Commonly known as:  VANCOCIN Take 2.5 mLs (125 mg total) by mouth every 6 (six) hours. For 9 days         Diet and Activity recommendation: See Discharge Instructions above   Consults obtained -palliative care, intensivist,  Major procedures and Radiology Reports - PLEASE review detailed and final reports for all details, in brief -     Dg Chest 1 View  Result Date: 02/19/2018 CLINICAL DATA:  Diverticulitis EXAM: CHEST  1 VIEW COMPARISON:  12/11/2017 FINDINGS: Lungs are essentially clear.  No focal consolidation. Cardiomegaly. IMPRESSION: No evidence of acute cardiopulmonary disease. Electronically Signed   By: Julian Hy M.D.   On: 02/19/2018 13:45   Ct Chest W Contrast  Result Date: 03/13/2018 CLINICAL DATA:  Shortness of breath, pneumonia, respiratory failure, hypoxia EXAM: CT CHEST WITH CONTRAST TECHNIQUE: Multidetector CT imaging of the chest was performed during intravenous contrast administration. CONTRAST:  57mL OMNIPAQUE IOHEXOL 300 MG/ML  SOLN COMPARISON:  Chest x-ray 03/11/2018.  Chest CT 12/24/2015 FINDINGS: Cardiovascular: Cardiomegaly. Scattered coronary artery and aortic calcifications. No aneurysm. Mediastinum/Nodes: No mediastinal, hilar, or axillary adenopathy. Calcified right paratracheal lymph node Lungs/Pleura: Large right pleural effusion and small left pleural effusion. Consolidation in much of the right lower lobe could reflect compressive atelectasis or pneumonia. Similar consolidation in the right middle lobe. Compressive atelectasis in the posterior aspect of the right upper lobe. Mild scattered ground-glass tree-in-bud nodular densities in the aerated right upper lobe, likely  infectious/inflammatory. Compressive atelectasis in the left lower lobe. Upper Abdomen: Calcifications in the spleen compatible with old granulomatous disease. No acute findings. Musculoskeletal: Chest wall soft tissues are unremarkable. No acute bony  abnormality. IMPRESSION: Large right pleural effusion with compressive atelectasis versus pneumonia involving the right middle lobe and right lower lobe. Ground-glass tree-in-bud nodular densities in the right upper lobe, likely infectious/inflammatory. Small left effusion with slight compressive atelectasis in the left lower lobe. Mild cardiomegaly. Coronary artery disease and aortic atherosclerosis. Electronically Signed   By: Rolm Baptise M.D.   On: 03/13/2018 11:20   Ct Abdomen Pelvis W Contrast  Result Date: 02/19/2018 CLINICAL DATA:  Followup diverticulitis. Patient has persistent abdominal pain. EXAM: CT ABDOMEN AND PELVIS WITH CONTRAST TECHNIQUE: Multidetector CT imaging of the abdomen and pelvis was performed using the standard protocol following bolus administration of intravenous contrast. CONTRAST:  120mL ISOVUE-300 IOPAMIDOL (ISOVUE-300) INJECTION 61% COMPARISON:  02/09/2018 FINDINGS: Lower chest: The lung bases are grossly clear. The heart is mildly enlarged but stable. No pericardial effusion. Hepatobiliary: No focal hepatic lesions or intrahepatic ductal dilatation. The gallbladder appears normal. No common bile duct dilatation. Pancreas: No mass, inflammation or ductal dilatation. Stable fatty changes. Spleen: Numerous scattered calcified granulomas but no mass or enlargement. Adrenals/Urinary Tract: Adrenal glands and kidneys are normal. No renal, ureteral or bladder calculi or mass. Stomach/Bowel: The stomach, duodenum, small bowel and colon are unremarkable. No acute inflammatory changes, mass lesions or obstructive findings. The terminal ileum is normal. The appendix is surgically absent. Significant sigmoid colon diverticulosis but no findings  for acute diverticulitis. Vascular/Lymphatic: Stable advanced atherosclerotic calcifications involving the aorta and iliac arteries. No aneurysm or dissection. The branch vessels are patent. The major venous structures are patent. No mesenteric or retroperitoneal mass or adenopathy. Small scattered lymph nodes are stable. Reproductive: Surgically absent. Other: No pelvic mass or adenopathy. No free pelvic fluid collections. No inguinal mass or adenopathy. No abdominal wall hernia or subcutaneous lesions. Small stable periumbilical abdominal wall hernia containing fat. Musculoskeletal: No significant bony findings. Stable degenerative changes involving the spine. IMPRESSION: 1. No CT findings to suggest acute diverticulitis. Stable significant sigmoid colon diverticulosis. 2. No acute abdominal/pelvic findings, mass lesions or adenopathy. Electronically Signed   By: Marijo Sanes M.D.   On: 02/19/2018 14:44   Dg Chest Port 1 View  Result Date: 03/13/2018 CLINICAL DATA:  Status post right thoracentesis. EXAM: PORTABLE CHEST 1 VIEW COMPARISON:  03/11/2018 and chest CT dated 03/13/2018. FINDINGS: Stable enlarged cardiac silhouette. Significantly decreased pleural fluid and airspace opacity at the right lung base. Stable prominence of the pulmonary vasculature. The interstitial markings appear slightly more prominent. No pneumothorax. Calcified hilar and mediastinal lymph nodes. Bilateral shoulder degenerative changes. Thoracic spine degenerative changes. IMPRESSION: 1. No pneumothorax following right thoracentesis. 2. Improved atelectasis and possible pneumonia at the right lung base. 3. Stable cardiomegaly, pulmonary vascular congestion and chronic interstitial lung disease with possible interval superimposed minimal interstitial pulmonary edema. Electronically Signed   By: Claudie Revering M.D.   On: 03/13/2018 17:20   Dg Chest Port 1 View  Result Date: 03/11/2018 CLINICAL DATA:  Shortness of breath and cough,  pneumonia, acute respiratory failure, COPD, hypertension, former smoker EXAM: PORTABLE CHEST 1 VIEW COMPARISON:  Portable exam 1236 hours compared 02/02/2018 FINDINGS: Enlargement of cardiac silhouette. Mediastinal contours and pulmonary vascularity normal. Increased consolidation and suspect small pleural effusion at RIGHT lung base. LEFT lung clear. Central peribronchial thickening. No pneumothorax. Calcified RIGHT paratracheal/hilar lymph nodes. Bones demineralized. IMPRESSION: Increased RIGHT basilar consolidation and small RIGHT pleural effusion. Electronically Signed   By: Lavonia Dana M.D.   On: 03/11/2018 13:02   Dg Chest Portable 1 View  Result Date: 03/05/2018  CLINICAL DATA:  Recent discharge for pneumonia. EXAM: PORTABLE CHEST 1 VIEW COMPARISON:  February 19, 2018 FINDINGS: No pneumothorax. The cardiomediastinal silhouette is stable. New rounded infiltrate in the lateral right base. No other interval change. IMPRESSION: New rounded infiltrate in the lateral right lung base may represent pneumonia given history. Recommend short-term follow-up to ensure resolution. Electronically Signed   By: Dorise Bullion III M.D   On: 03/05/2018 12:24   US Thoracentesis Asp Pleural Space W/img Guide  Result Date: 03/13/2018 INDICATION: Shortness of breath, right pleural effusion and sepsis with Streptococcus pneumonia. EXAM: ULTRASOUND GUIDED RIGHT THORACENTESIS MEDICATIONS: None. COMPLICATIONS: None immediate. PROCEDURE: An ultrasound guided thoracentesis was thoroughly discussed with the patient, patient's daughter and questions answered. The benefits, risks, alternatives and complications were also discussed. The patient and daughter understand and wish to proceed with the procedure. Written consent was obtained. Ultrasound was performed to localize and mark an adequate pocket of fluid in the right chest. The area was then prepped and draped in the normal sterile fashion. 1% Lidocaine was used for local  anesthesia. Under ultrasound guidance a 6 Fr Safe-T-Centesis catheter was introduced. Thoracentesis was performed. The catheter was removed and a dressing applied. FINDINGS: A total of approximately 750 mL of amber colored fluid was removed. Samples were sent to the laboratory as requested by the clinical team. IMPRESSION: Successful ultrasound guided right thoracentesis yielding 750 mL of pleural fluid. Electronically Signed   By: Markus Daft M.D.   On: 03/13/2018 16:59    Micro Results    Recent Results (from the past 240 hour(s))  Body fluid culture     Status: None   Collection Time: 03/13/18  4:56 PM  Result Value Ref Range Status   Specimen Description   Final    PLEURAL Performed at [facility], [address], [place], [place] [address]    Special Requests   Final    NONE Performed at [facility], [address] [place], [place] [address]    Gram Stain   Final    FEW WBC PRESENT, PREDOMINANTLY MONONUCLEAR NO ORGANISMS SEEN    Culture   Final    NO GROWTH 3 DAYS Performed at [facility], [place] [address] [place], [place] [address]    Report Status 03/17/2018 FINAL  Final  C difficile quick scan w PCR reflex     Status: Abnormal   Collection Time: 03/15/18  4:51 AM  Result Value Ref Range Status   C Diff antigen POSITIVE (A) NEGATIVE Final   C Diff toxin POSITIVE (A) NEGATIVE Final   C Diff interpretation Toxin producing C. difficile detected.  Final    Comment: CRITICAL RESULT CALLED TO, READ BACK BY AND VERIFIED WITH:  Kyla Balzarine AT 3500 03/15/18 SDR Performed at [facility], [address] [place], [place] [address]   CULTURE, BLOOD (ROUTINE X 2) w Reflex to ID Panel     Status: None (Preliminary result)   Collection Time: 03/18/18 12:34 PM  Result Value Ref Range Status   Specimen Description BLOOD L HAND  Final   Special Requests   Final    BOTTLES DRAWN AEROBIC AND ANAEROBIC Blood Culture adequate volume   Culture   Final     NO GROWTH 2 DAYS Performed at [facility], [address] [place], [place] [address]    Report Status PENDING  Incomplete  CULTURE, BLOOD (ROUTINE X 2) w Reflex to ID Panel     Status:  None (Preliminary result)   Collection Time: 03/18/18 12:43 PM  Result Value Ref Range Status   Specimen Description BLOOD R AC  Final   Special Requests   Final    BOTTLES DRAWN AEROBIC AND ANAEROBIC Blood Culture adequate volume   Culture   Final    NO GROWTH 2 DAYS Performed at [facility], [address] [place], [place] [address]    Report Status PENDING  Incomplete       Today   Subjective:   Vicki Morales today has no abdominal pain or diarrhea.  Objective:   Blood pressure 128/73, pulse 73, temperature (!) 97.5 F (36.4 C), temperature source Oral, resp. rate 18, height 5\' 4"  (1.626 m), weight 122.7 kg, SpO2 96 %.   Intake/Output Summary (Last 24 hours) at 03/20/2018 0926 Last data filed at 03/20/2018 0616 Gross per 24 hour  Intake -  Output 1600 ml  Net -1600 ml    Exam Awake Alert, Oriented x 3, No new F.N deficits, Normal affect [place].AT,PERRAL Supple Neck,No JVD, No cervical lymphadenopathy appriciated.  Symmetrical Chest wall movement, Good air movement bilaterally, CTAB RRR,No Gallops,Rubs or new Murmurs, No Parasternal Heave +ve B.Sounds, Abd Soft, Non tender, No organomegaly appriciated, No rebound -guarding or rigidity. No Cyanosis, Clubbing or edema, No new Rash or bruise  Data Review   CBC w Diff:  Lab Results  Component Value Date   WBC 6.5 03/18/2018   HGB 11.4 (L) 03/18/2018   HGB 15.4 05/22/2015   HCT 34.7 (L) 03/18/2018   HCT 46.5 05/22/2015   PLT 181 03/18/2018   PLT 192 05/22/2015   LYMPHOPCT 1 03/05/2018   LYMPHOPCT 19.5 11/03/2012   MONOPCT 8 03/05/2018   MONOPCT 7.8 11/03/2012   EOSPCT 0 03/05/2018   EOSPCT 0.0 11/03/2012   BASOPCT 1 03/05/2018   BASOPCT 0.1 11/03/2012    CMP:  Lab Results  Component Value Date    NA 131 (L) 03/17/2018   NA 137 08/20/2016   NA 137 09/25/2014   K 4.0 03/17/2018   K 3.7 09/25/2014   CL 87 (L) 03/17/2018   CL 103 09/25/2014   CO2 34 (H) 03/17/2018   CO2 28 09/25/2014   BUN 32 (H) 03/17/2018   BUN 13 08/20/2016   BUN 15 09/25/2014   CREATININE 0.62 03/18/2018   CREATININE 0.82 09/25/2014   GLU 85 01/21/2014   PROT 5.7 (L) 03/05/2018   PROT 6.0 05/22/2015   PROT 6.7 09/25/2014   ALBUMIN 2.8 (L) 03/05/2018   ALBUMIN 3.8 08/20/2016   ALBUMIN 4.2 09/25/2014   BILITOT 0.9 03/05/2018   BILITOT 0.4 05/22/2015   BILITOT 0.7 09/25/2014   ALKPHOS 42 03/05/2018   ALKPHOS 84 09/25/2014   AST 13 (L) 03/05/2018   AST 22 09/25/2014   ALT 12 03/05/2018   ALT 18 09/25/2014  .   Total Time in preparing paper work, data evaluation and todays exam - 35 minutes  Epifanio Lesches M.D on 03/20/2018 at 9:26 AM    Note: This dictation was prepared with Dragon dictation along with smaller phrase technology. Any transcriptional errors that result from this process are unintentional.

## 2018-03-20 NOTE — Care Management Important Message (Signed)
Important Message  Patient Details  Name: Vicki Morales MRN: [id number] Date of Birth: [birth year]   Medicare Important Message Given:  Yes    Shelbie Ammons, RN 03/20/2018, 12:42 PM

## 2018-03-21 DIAGNOSIS — F331 Major depressive disorder, recurrent, moderate: Secondary | ICD-10-CM | POA: Diagnosis not present

## 2018-03-21 DIAGNOSIS — E7849 Other hyperlipidemia: Secondary | ICD-10-CM | POA: Diagnosis not present

## 2018-03-21 DIAGNOSIS — J449 Chronic obstructive pulmonary disease, unspecified: Secondary | ICD-10-CM | POA: Diagnosis not present

## 2018-03-21 DIAGNOSIS — A0472 Enterocolitis due to Clostridium difficile, not specified as recurrent: Secondary | ICD-10-CM | POA: Diagnosis not present

## 2018-03-23 LAB — CULTURE, BLOOD (ROUTINE X 2)
CULTURE: NO GROWTH
Culture: NO GROWTH
Special Requests: ADEQUATE
Special Requests: ADEQUATE

## 2018-03-24 DIAGNOSIS — F203 Undifferentiated schizophrenia: Secondary | ICD-10-CM | POA: Diagnosis not present

## 2018-03-24 DIAGNOSIS — R112 Nausea with vomiting, unspecified: Secondary | ICD-10-CM | POA: Diagnosis not present

## 2018-03-24 DIAGNOSIS — Z515 Encounter for palliative care: Secondary | ICD-10-CM | POA: Diagnosis not present

## 2018-03-24 DIAGNOSIS — J9601 Acute respiratory failure with hypoxia: Secondary | ICD-10-CM | POA: Diagnosis not present

## 2018-04-07 DIAGNOSIS — M6281 Muscle weakness (generalized): Secondary | ICD-10-CM | POA: Diagnosis not present

## 2018-04-07 DIAGNOSIS — J9601 Acute respiratory failure with hypoxia: Secondary | ICD-10-CM | POA: Diagnosis not present

## 2018-04-07 DIAGNOSIS — F419 Anxiety disorder, unspecified: Secondary | ICD-10-CM | POA: Diagnosis not present

## 2018-04-07 DIAGNOSIS — R5383 Other fatigue: Secondary | ICD-10-CM | POA: Diagnosis not present

## 2018-04-07 DIAGNOSIS — J45998 Other asthma: Secondary | ICD-10-CM | POA: Diagnosis not present

## 2018-04-07 DIAGNOSIS — Z79899 Other long term (current) drug therapy: Secondary | ICD-10-CM | POA: Diagnosis not present

## 2018-04-07 DIAGNOSIS — A0472 Enterocolitis due to Clostridium difficile, not specified as recurrent: Secondary | ICD-10-CM | POA: Diagnosis not present

## 2018-04-07 DIAGNOSIS — F331 Major depressive disorder, recurrent, moderate: Secondary | ICD-10-CM | POA: Diagnosis not present

## 2018-04-07 DIAGNOSIS — G4734 Idiopathic sleep related nonobstructive alveolar hypoventilation: Secondary | ICD-10-CM | POA: Diagnosis not present

## 2018-04-07 DIAGNOSIS — Z6841 Body Mass Index (BMI) 40.0 and over, adult: Secondary | ICD-10-CM | POA: Diagnosis not present

## 2018-04-07 DIAGNOSIS — R0989 Other specified symptoms and signs involving the circulatory and respiratory systems: Secondary | ICD-10-CM | POA: Diagnosis not present

## 2018-04-07 DIAGNOSIS — G4700 Insomnia, unspecified: Secondary | ICD-10-CM | POA: Diagnosis not present

## 2018-04-07 DIAGNOSIS — I509 Heart failure, unspecified: Secondary | ICD-10-CM | POA: Diagnosis not present

## 2018-04-07 DIAGNOSIS — J441 Chronic obstructive pulmonary disease with (acute) exacerbation: Secondary | ICD-10-CM | POA: Diagnosis not present

## 2018-04-07 DIAGNOSIS — J9602 Acute respiratory failure with hypercapnia: Secondary | ICD-10-CM | POA: Diagnosis not present

## 2018-04-07 DIAGNOSIS — I4891 Unspecified atrial fibrillation: Secondary | ICD-10-CM | POA: Diagnosis not present

## 2018-04-07 DIAGNOSIS — E1140 Type 2 diabetes mellitus with diabetic neuropathy, unspecified: Secondary | ICD-10-CM | POA: Diagnosis not present

## 2018-04-07 DIAGNOSIS — I2729 Other secondary pulmonary hypertension: Secondary | ICD-10-CM | POA: Diagnosis not present

## 2018-04-07 DIAGNOSIS — F200 Paranoid schizophrenia: Secondary | ICD-10-CM | POA: Diagnosis not present

## 2018-04-07 DIAGNOSIS — Z515 Encounter for palliative care: Secondary | ICD-10-CM | POA: Diagnosis not present

## 2018-04-07 DIAGNOSIS — G4701 Insomnia due to medical condition: Secondary | ICD-10-CM | POA: Diagnosis not present

## 2018-04-07 DIAGNOSIS — R05 Cough: Secondary | ICD-10-CM | POA: Diagnosis not present

## 2018-04-14 DIAGNOSIS — I2729 Other secondary pulmonary hypertension: Secondary | ICD-10-CM | POA: Diagnosis not present

## 2018-04-14 DIAGNOSIS — I4891 Unspecified atrial fibrillation: Secondary | ICD-10-CM | POA: Diagnosis not present

## 2018-04-14 DIAGNOSIS — I509 Heart failure, unspecified: Secondary | ICD-10-CM | POA: Diagnosis not present

## 2018-04-14 DIAGNOSIS — A0472 Enterocolitis due to Clostridium difficile, not specified as recurrent: Secondary | ICD-10-CM | POA: Diagnosis not present

## 2018-04-24 DIAGNOSIS — J9601 Acute respiratory failure with hypoxia: Secondary | ICD-10-CM | POA: Diagnosis not present

## 2018-04-24 DIAGNOSIS — Z515 Encounter for palliative care: Secondary | ICD-10-CM | POA: Diagnosis not present

## 2018-04-25 ENCOUNTER — Encounter: Payer: Self-pay | Admitting: Oncology

## 2018-04-25 ENCOUNTER — Encounter: Payer: Self-pay | Admitting: Podiatry

## 2018-04-25 ENCOUNTER — Encounter: Payer: Self-pay | Admitting: Family Medicine

## 2018-04-28 DIAGNOSIS — Z79899 Other long term (current) drug therapy: Secondary | ICD-10-CM | POA: Diagnosis not present

## 2018-04-28 DIAGNOSIS — F200 Paranoid schizophrenia: Secondary | ICD-10-CM | POA: Diagnosis not present

## 2018-04-28 DIAGNOSIS — R5383 Other fatigue: Secondary | ICD-10-CM | POA: Diagnosis not present

## 2018-04-28 DIAGNOSIS — G4700 Insomnia, unspecified: Secondary | ICD-10-CM | POA: Diagnosis not present

## 2018-05-06 DIAGNOSIS — F331 Major depressive disorder, recurrent, moderate: Secondary | ICD-10-CM | POA: Diagnosis not present

## 2018-05-06 DIAGNOSIS — F200 Paranoid schizophrenia: Secondary | ICD-10-CM | POA: Diagnosis not present

## 2018-05-11 DIAGNOSIS — F200 Paranoid schizophrenia: Secondary | ICD-10-CM | POA: Diagnosis not present

## 2018-05-11 DIAGNOSIS — G4701 Insomnia due to medical condition: Secondary | ICD-10-CM | POA: Diagnosis not present

## 2018-05-11 DIAGNOSIS — F419 Anxiety disorder, unspecified: Secondary | ICD-10-CM | POA: Diagnosis not present

## 2018-05-11 DIAGNOSIS — F331 Major depressive disorder, recurrent, moderate: Secondary | ICD-10-CM | POA: Diagnosis not present

## 2018-05-25 DIAGNOSIS — F419 Anxiety disorder, unspecified: Secondary | ICD-10-CM | POA: Diagnosis not present

## 2018-05-25 DIAGNOSIS — F331 Major depressive disorder, recurrent, moderate: Secondary | ICD-10-CM | POA: Diagnosis not present

## 2018-05-25 DIAGNOSIS — G4701 Insomnia due to medical condition: Secondary | ICD-10-CM | POA: Diagnosis not present

## 2018-05-25 DIAGNOSIS — F200 Paranoid schizophrenia: Secondary | ICD-10-CM | POA: Diagnosis not present

## 2018-06-02 DIAGNOSIS — F200 Paranoid schizophrenia: Secondary | ICD-10-CM | POA: Diagnosis not present

## 2018-06-06 DIAGNOSIS — F200 Paranoid schizophrenia: Secondary | ICD-10-CM | POA: Diagnosis not present

## 2018-06-06 DIAGNOSIS — F331 Major depressive disorder, recurrent, moderate: Secondary | ICD-10-CM | POA: Diagnosis not present

## 2018-06-08 DIAGNOSIS — R05 Cough: Secondary | ICD-10-CM | POA: Diagnosis not present

## 2018-06-09 ENCOUNTER — Inpatient Hospital Stay
Admission: EM | Admit: 2018-06-09 | Discharge: 2018-06-23 | DRG: 291 | Disposition: A | Discharge status: Skilled Nursing Facility | Payer: Medicare HMO | Source: Skilled Nursing Facility | Attending: Internal Medicine | Admitting: Internal Medicine

## 2018-06-09 ENCOUNTER — Emergency Department: Payer: Medicare HMO

## 2018-06-09 ENCOUNTER — Other Ambulatory Visit: Payer: Self-pay

## 2018-06-09 ENCOUNTER — Encounter: Payer: Self-pay | Admitting: Emergency Medicine

## 2018-06-09 DIAGNOSIS — I5033 Acute on chronic diastolic (congestive) heart failure: Secondary | ICD-10-CM | POA: Diagnosis present

## 2018-06-09 DIAGNOSIS — F331 Major depressive disorder, recurrent, moderate: Secondary | ICD-10-CM | POA: Diagnosis not present

## 2018-06-09 DIAGNOSIS — F339 Major depressive disorder, recurrent, unspecified: Secondary | ICD-10-CM | POA: Diagnosis present

## 2018-06-09 DIAGNOSIS — R339 Retention of urine, unspecified: Secondary | ICD-10-CM

## 2018-06-09 DIAGNOSIS — J9602 Acute respiratory failure with hypercapnia: Secondary | ICD-10-CM

## 2018-06-09 DIAGNOSIS — Z825 Family history of asthma and other chronic lower respiratory diseases: Secondary | ICD-10-CM | POA: Diagnosis not present

## 2018-06-09 DIAGNOSIS — G2581 Restless legs syndrome: Secondary | ICD-10-CM | POA: Diagnosis present

## 2018-06-09 DIAGNOSIS — D649 Anemia, unspecified: Secondary | ICD-10-CM | POA: Diagnosis present

## 2018-06-09 DIAGNOSIS — F32A Depression, unspecified: Secondary | ICD-10-CM | POA: Diagnosis present

## 2018-06-09 DIAGNOSIS — H9190 Unspecified hearing loss, unspecified ear: Secondary | ICD-10-CM | POA: Diagnosis present

## 2018-06-09 DIAGNOSIS — R0602 Shortness of breath: Secondary | ICD-10-CM | POA: Diagnosis not present

## 2018-06-09 DIAGNOSIS — Z90710 Acquired absence of both cervix and uterus: Secondary | ICD-10-CM | POA: Diagnosis not present

## 2018-06-09 DIAGNOSIS — R0689 Other abnormalities of breathing: Secondary | ICD-10-CM | POA: Diagnosis not present

## 2018-06-09 DIAGNOSIS — J9622 Acute and chronic respiratory failure with hypercapnia: Secondary | ICD-10-CM | POA: Diagnosis present

## 2018-06-09 DIAGNOSIS — I10 Essential (primary) hypertension: Secondary | ICD-10-CM | POA: Diagnosis not present

## 2018-06-09 DIAGNOSIS — I509 Heart failure, unspecified: Secondary | ICD-10-CM

## 2018-06-09 DIAGNOSIS — K219 Gastro-esophageal reflux disease without esophagitis: Secondary | ICD-10-CM | POA: Diagnosis present

## 2018-06-09 DIAGNOSIS — F419 Anxiety disorder, unspecified: Secondary | ICD-10-CM | POA: Diagnosis present

## 2018-06-09 DIAGNOSIS — E874 Mixed disorder of acid-base balance: Secondary | ICD-10-CM | POA: Diagnosis present

## 2018-06-09 DIAGNOSIS — J9621 Acute and chronic respiratory failure with hypoxia: Secondary | ICD-10-CM | POA: Diagnosis not present

## 2018-06-09 DIAGNOSIS — J9600 Acute respiratory failure, unspecified whether with hypoxia or hypercapnia: Secondary | ICD-10-CM | POA: Diagnosis not present

## 2018-06-09 DIAGNOSIS — I472 Ventricular tachycardia: Secondary | ICD-10-CM | POA: Diagnosis not present

## 2018-06-09 DIAGNOSIS — J440 Chronic obstructive pulmonary disease with acute lower respiratory infection: Secondary | ICD-10-CM | POA: Diagnosis not present

## 2018-06-09 DIAGNOSIS — I160 Hypertensive urgency: Secondary | ICD-10-CM | POA: Diagnosis not present

## 2018-06-09 DIAGNOSIS — Z823 Family history of stroke: Secondary | ICD-10-CM | POA: Diagnosis not present

## 2018-06-09 DIAGNOSIS — R079 Chest pain, unspecified: Secondary | ICD-10-CM | POA: Diagnosis not present

## 2018-06-09 DIAGNOSIS — N179 Acute kidney failure, unspecified: Secondary | ICD-10-CM | POA: Diagnosis not present

## 2018-06-09 DIAGNOSIS — J9601 Acute respiratory failure with hypoxia: Secondary | ICD-10-CM | POA: Diagnosis not present

## 2018-06-09 DIAGNOSIS — Z888 Allergy status to other drugs, medicaments and biological substances status: Secondary | ICD-10-CM

## 2018-06-09 DIAGNOSIS — E871 Hypo-osmolality and hyponatremia: Secondary | ICD-10-CM | POA: Diagnosis present

## 2018-06-09 DIAGNOSIS — Z87891 Personal history of nicotine dependence: Secondary | ICD-10-CM | POA: Diagnosis not present

## 2018-06-09 DIAGNOSIS — J441 Chronic obstructive pulmonary disease with (acute) exacerbation: Secondary | ICD-10-CM | POA: Diagnosis present

## 2018-06-09 DIAGNOSIS — I959 Hypotension, unspecified: Secondary | ICD-10-CM | POA: Diagnosis not present

## 2018-06-09 DIAGNOSIS — Z818 Family history of other mental and behavioral disorders: Secondary | ICD-10-CM

## 2018-06-09 DIAGNOSIS — I482 Chronic atrial fibrillation, unspecified: Secondary | ICD-10-CM | POA: Diagnosis not present

## 2018-06-09 DIAGNOSIS — J449 Chronic obstructive pulmonary disease, unspecified: Secondary | ICD-10-CM | POA: Diagnosis not present

## 2018-06-09 DIAGNOSIS — I2729 Other secondary pulmonary hypertension: Secondary | ICD-10-CM | POA: Diagnosis not present

## 2018-06-09 DIAGNOSIS — Z885 Allergy status to narcotic agent status: Secondary | ICD-10-CM

## 2018-06-09 DIAGNOSIS — R0989 Other specified symptoms and signs involving the circulatory and respiratory systems: Secondary | ICD-10-CM | POA: Diagnosis not present

## 2018-06-09 DIAGNOSIS — Z7901 Long term (current) use of anticoagulants: Secondary | ICD-10-CM

## 2018-06-09 DIAGNOSIS — R4182 Altered mental status, unspecified: Secondary | ICD-10-CM | POA: Diagnosis not present

## 2018-06-09 DIAGNOSIS — Z79899 Other long term (current) drug therapy: Secondary | ICD-10-CM

## 2018-06-09 DIAGNOSIS — I110 Hypertensive heart disease with heart failure: Principal | ICD-10-CM | POA: Diagnosis present

## 2018-06-09 DIAGNOSIS — F411 Generalized anxiety disorder: Secondary | ICD-10-CM | POA: Diagnosis not present

## 2018-06-09 DIAGNOSIS — Z66 Do not resuscitate: Secondary | ICD-10-CM | POA: Diagnosis present

## 2018-06-09 DIAGNOSIS — F329 Major depressive disorder, single episode, unspecified: Secondary | ICD-10-CM | POA: Diagnosis present

## 2018-06-09 DIAGNOSIS — R404 Transient alteration of awareness: Secondary | ICD-10-CM | POA: Diagnosis not present

## 2018-06-09 DIAGNOSIS — Z7951 Long term (current) use of inhaled steroids: Secondary | ICD-10-CM

## 2018-06-09 DIAGNOSIS — R069 Unspecified abnormalities of breathing: Secondary | ICD-10-CM | POA: Diagnosis not present

## 2018-06-09 DIAGNOSIS — M2550 Pain in unspecified joint: Secondary | ICD-10-CM | POA: Diagnosis not present

## 2018-06-09 DIAGNOSIS — Z882 Allergy status to sulfonamides status: Secondary | ICD-10-CM

## 2018-06-09 DIAGNOSIS — R0902 Hypoxemia: Secondary | ICD-10-CM | POA: Diagnosis not present

## 2018-06-09 DIAGNOSIS — Z7401 Bed confinement status: Secondary | ICD-10-CM | POA: Diagnosis not present

## 2018-06-09 DIAGNOSIS — Z803 Family history of malignant neoplasm of breast: Secondary | ICD-10-CM

## 2018-06-09 DIAGNOSIS — R05 Cough: Secondary | ICD-10-CM | POA: Diagnosis not present

## 2018-06-09 DIAGNOSIS — Z880 Allergy status to penicillin: Secondary | ICD-10-CM

## 2018-06-09 LAB — CBC WITH DIFFERENTIAL/PLATELET
ABS IMMATURE GRANULOCYTES: 0.08 10*3/uL — AB (ref 0.00–0.07)
BASOS ABS: 0 10*3/uL (ref 0.0–0.1)
BASOS PCT: 0 %
Eosinophils Absolute: 0 10*3/uL (ref 0.0–0.5)
Eosinophils Relative: 0 %
HCT: 37.3 % (ref 36.0–46.0)
HEMOGLOBIN: 11.5 g/dL — AB (ref 12.0–15.0)
IMMATURE GRANULOCYTES: 1 %
LYMPHS PCT: 3 %
Lymphs Abs: 0.3 10*3/uL — ABNORMAL LOW (ref 0.7–4.0)
MCH: 29.6 pg (ref 26.0–34.0)
MCHC: 30.8 g/dL (ref 30.0–36.0)
MCV: 95.9 fL (ref 80.0–100.0)
MONO ABS: 0.8 10*3/uL (ref 0.1–1.0)
Monocytes Relative: 6 %
NEUTROS PCT: 90 %
NRBC: 0 % (ref 0.0–0.2)
Neutro Abs: 10.8 10*3/uL — ABNORMAL HIGH (ref 1.7–7.7)
PLATELETS: 262 10*3/uL (ref 150–400)
RBC: 3.89 MIL/uL (ref 3.87–5.11)
RDW: 14.7 % (ref 11.5–15.5)
WBC: 12 10*3/uL — ABNORMAL HIGH (ref 4.0–10.5)

## 2018-06-09 LAB — BASIC METABOLIC PANEL
Anion gap: 9 (ref 5–15)
BUN: 11 mg/dL (ref 8–23)
CHLORIDE: 89 mmol/L — AB (ref 98–111)
CO2: 30 mmol/L (ref 22–32)
Calcium: 8.6 mg/dL — ABNORMAL LOW (ref 8.9–10.3)
Creatinine, Ser: 0.81 mg/dL (ref 0.44–1.00)
Glucose, Bld: 171 mg/dL — ABNORMAL HIGH (ref 70–99)
POTASSIUM: 4 mmol/L (ref 3.5–5.1)
SODIUM: 128 mmol/L — AB (ref 135–145)

## 2018-06-09 LAB — BRAIN NATRIURETIC PEPTIDE: B NATRIURETIC PEPTIDE 5: 793 pg/mL — AB (ref 0.0–100.0)

## 2018-06-09 LAB — GLUCOSE, CAPILLARY
Glucose-Capillary: 173 mg/dL — ABNORMAL HIGH (ref 70–99)
Glucose-Capillary: 144 mg/dL — ABNORMAL HIGH (ref 70–99)
Glucose-Capillary: 129 mg/dL — ABNORMAL HIGH (ref 70–99)

## 2018-06-09 LAB — BLOOD GAS, VENOUS
Acid-Base Excess: 7 mmol/L — ABNORMAL HIGH (ref 0.0–2.0)
Bicarbonate: 37.2 mmol/L — ABNORMAL HIGH (ref 20.0–28.0)
O2 SAT: 85.8 %
PATIENT TEMPERATURE: 37
PCO2 VEN: 83 mmHg — AB (ref 44.0–60.0)
pH, Ven: 7.26 (ref 7.250–7.430)
pO2, Ven: 59 mmHg — ABNORMAL HIGH (ref 32.0–45.0)

## 2018-06-09 LAB — BLOOD GAS, ARTERIAL
Acid-Base Excess: 13 mmol/L — ABNORMAL HIGH (ref 0.0–2.0)
Bicarbonate: 41.4 mmol/L — ABNORMAL HIGH (ref 20.0–28.0)
FIO2: 0.32
O2 Saturation: 92.9 %
Patient temperature: 37
pCO2 arterial: 70 mmHg (ref 32.0–48.0)
pH, Arterial: 7.38 (ref 7.350–7.450)
pO2, Arterial: 68 mmHg — ABNORMAL LOW (ref 83.0–108.0)

## 2018-06-09 LAB — TROPONIN I

## 2018-06-09 LAB — CG4 I-STAT (LACTIC ACID): LACTIC ACID, VENOUS: 1.44 mmol/L (ref 0.5–1.9)

## 2018-06-09 LAB — MRSA PCR SCREENING: MRSA by PCR: NEGATIVE

## 2018-06-09 MED ORDER — GUAIFENESIN ER 600 MG PO TB12
600.0000 mg | ORAL_TABLET | Freq: Two times a day (BID) | ORAL | Status: DC
  Administered 2018-06-09 – 2018-06-23 (×28): 600 mg via ORAL
  Filled 2018-06-09 (×31): qty 1

## 2018-06-09 MED ORDER — SODIUM CHLORIDE 0.9 % IV SOLN: 250.0000 mL | INTRAVENOUS | Status: DC | PRN

## 2018-06-09 MED ORDER — SODIUM CHLORIDE 0.9% FLUSH
3.0000 mL | Freq: Two times a day (BID) | INTRAVENOUS | Status: DC
  Administered 2018-06-09 – 2018-06-23 (×26): 3 mL via INTRAVENOUS

## 2018-06-09 MED ORDER — MIDODRINE HCL 5 MG PO TABS
10.0000 mg | ORAL_TABLET | Freq: Three times a day (TID) | ORAL | Status: DC
  Filled 2018-06-09 (×3): qty 2

## 2018-06-09 MED ORDER — SENNOSIDES-DOCUSATE SODIUM 8.6-50 MG PO TABS: 1.0000 | ORAL_TABLET | Freq: Every evening | ORAL | Status: DC | PRN

## 2018-06-09 MED ORDER — HYDRALAZINE HCL 10 MG PO TABS
10.0000 mg | ORAL_TABLET | Freq: Two times a day (BID) | ORAL | Status: DC
  Administered 2018-06-09 – 2018-06-10 (×2): 10 mg via ORAL
  Filled 2018-06-09 (×2): qty 1

## 2018-06-09 MED ORDER — ADULT MULTIVITAMIN W/MINERALS CH
1.0000 | ORAL_TABLET | Freq: Every day | ORAL | Status: DC
  Administered 2018-06-10 – 2018-06-17 (×8): 1 via ORAL
  Administered 2018-06-18: 11:00:00 0.5 via ORAL
  Administered 2018-06-20 – 2018-06-21 (×2): 1 via ORAL
  Filled 2018-06-09 (×13): qty 1

## 2018-06-09 MED ORDER — GUAIFENESIN-DM 100-10 MG/5ML PO SYRP
5.0000 mL | ORAL_SOLUTION | ORAL | Status: DC | PRN
  Administered 2018-06-10 – 2018-06-13 (×5): 5 mL via ORAL
  Filled 2018-06-09 (×6): qty 5

## 2018-06-09 MED ORDER — OMEGA-3-ACID ETHYL ESTERS 1 G PO CAPS
1.0000 g | ORAL_CAPSULE | Freq: Every day | ORAL | Status: DC
  Administered 2018-06-09 – 2018-06-21 (×9): 1 g via ORAL
  Filled 2018-06-09 (×14): qty 1

## 2018-06-09 MED ORDER — LEVOFLOXACIN IN D5W 750 MG/150ML IV SOLN
750.0000 mg | INTRAVENOUS | Status: DC
  Administered 2018-06-09: 750 mg via INTRAVENOUS
  Filled 2018-06-09 (×2): qty 150

## 2018-06-09 MED ORDER — DIGOXIN 125 MCG PO TABS
0.0625 mg | ORAL_TABLET | Freq: Every day | ORAL | Status: DC
  Administered 2018-06-09 – 2018-06-23 (×15): 0.0625 mg via ORAL
  Filled 2018-06-09 (×15): qty 0.5

## 2018-06-09 MED ORDER — MELATONIN 5 MG PO TABS
10.0000 mg | ORAL_TABLET | Freq: Every day | ORAL | Status: DC
  Administered 2018-06-09 – 2018-06-23 (×10): 10 mg via ORAL
  Filled 2018-06-09 (×15): qty 2

## 2018-06-09 MED ORDER — LOPERAMIDE HCL 2 MG PO CAPS: 2.0000 mg | ORAL_CAPSULE | ORAL | Status: DC | PRN

## 2018-06-09 MED ORDER — GABAPENTIN 600 MG PO TABS
600.0000 mg | ORAL_TABLET | Freq: Every day | ORAL | Status: DC
  Administered 2018-06-09 – 2018-06-17 (×9): 600 mg via ORAL
  Filled 2018-06-09 (×10): qty 1

## 2018-06-09 MED ORDER — BUSPIRONE HCL 15 MG PO TABS
15.0000 mg | ORAL_TABLET | Freq: Three times a day (TID) | ORAL | Status: DC
  Administered 2018-06-09 – 2018-06-23 (×42): 15 mg via ORAL
  Filled 2018-06-09 (×45): qty 1

## 2018-06-09 MED ORDER — ONDANSETRON HCL 4 MG PO TABS: 4.0000 mg | ORAL_TABLET | Freq: Four times a day (QID) | ORAL | Status: DC | PRN

## 2018-06-09 MED ORDER — OXYBUTYNIN CHLORIDE ER 10 MG PO TB24
10.0000 mg | ORAL_TABLET | Freq: Every day | ORAL | Status: DC
  Administered 2018-06-09 – 2018-06-20 (×12): 10 mg via ORAL
  Filled 2018-06-09 (×13): qty 1

## 2018-06-09 MED ORDER — ASPIRIN EC 81 MG PO TBEC
81.0000 mg | DELAYED_RELEASE_TABLET | Freq: Every day | ORAL | Status: DC
  Administered 2018-06-10 – 2018-06-23 (×13): 81 mg via ORAL
  Filled 2018-06-09 (×14): qty 1

## 2018-06-09 MED ORDER — RISPERIDONE 1 MG PO TABS
1.0000 mg | ORAL_TABLET | Freq: Every day | ORAL | Status: DC
  Administered 2018-06-09 – 2018-06-23 (×14): 1 mg via ORAL
  Filled 2018-06-09 (×15): qty 1

## 2018-06-09 MED ORDER — FUROSEMIDE 10 MG/ML IJ SOLN
40.0000 mg | Freq: Two times a day (BID) | INTRAMUSCULAR | Status: DC
  Administered 2018-06-09 – 2018-06-13 (×7): 40 mg via INTRAVENOUS
  Filled 2018-06-09 (×7): qty 4

## 2018-06-09 MED ORDER — INSULIN ASPART 100 UNIT/ML ~~LOC~~ SOLN
0.0000 [IU] | Freq: Three times a day (TID) | SUBCUTANEOUS | Status: DC
  Administered 2018-06-11 (×2): 2 [IU] via SUBCUTANEOUS
  Administered 2018-06-12: 3 [IU] via SUBCUTANEOUS
  Administered 2018-06-12 – 2018-06-13 (×2): 2 [IU] via SUBCUTANEOUS
  Administered 2018-06-13: 5 [IU] via SUBCUTANEOUS
  Administered 2018-06-15 – 2018-06-23 (×6): 2 [IU] via SUBCUTANEOUS
  Filled 2018-06-09 (×11): qty 1

## 2018-06-09 MED ORDER — VITAMIN B-12 1000 MCG PO TABS
1000.0000 ug | ORAL_TABLET | Freq: Every day | ORAL | Status: DC
  Administered 2018-06-09 – 2018-06-23 (×13): 1000 ug via ORAL
  Filled 2018-06-09 (×12): qty 1

## 2018-06-09 MED ORDER — GABAPENTIN 100 MG PO CAPS
100.0000 mg | ORAL_CAPSULE | Freq: Two times a day (BID) | ORAL | Status: DC
  Administered 2018-06-10 – 2018-06-23 (×27): 100 mg via ORAL
  Filled 2018-06-09 (×28): qty 1

## 2018-06-09 MED ORDER — SODIUM CHLORIDE 0.9% FLUSH: 3.0000 mL | INTRAVENOUS | Status: DC | PRN

## 2018-06-09 MED ORDER — ONDANSETRON HCL 4 MG/2ML IJ SOLN: 4.0000 mg | Freq: Four times a day (QID) | INTRAMUSCULAR | Status: DC | PRN

## 2018-06-09 MED ORDER — ENSURE ENLIVE PO LIQD
237.0000 mL | Freq: Two times a day (BID) | ORAL | Status: DC
  Administered 2018-06-10 – 2018-06-23 (×13): 237 mL via ORAL

## 2018-06-09 MED ORDER — ALPRAZOLAM 0.5 MG PO TABS
0.5000 mg | ORAL_TABLET | Freq: Every evening | ORAL | Status: DC | PRN
  Administered 2018-06-09 – 2018-06-17 (×3): 0.5 mg via ORAL
  Filled 2018-06-09 (×4): qty 1

## 2018-06-09 MED ORDER — MECLIZINE HCL 12.5 MG PO TABS
12.5000 mg | ORAL_TABLET | Freq: Two times a day (BID) | ORAL | Status: DC | PRN
  Filled 2018-06-09: qty 1

## 2018-06-09 MED ORDER — MAGNESIUM OXIDE 400 (241.3 MG) MG PO TABS
400.0000 mg | ORAL_TABLET | Freq: Two times a day (BID) | ORAL | Status: DC
  Administered 2018-06-09 – 2018-06-14 (×10): 400 mg via ORAL
  Filled 2018-06-09 (×10): qty 1

## 2018-06-09 MED ORDER — PREMIER PROTEIN SHAKE
11.0000 [oz_av] | Freq: Two times a day (BID) | ORAL | Status: DC
  Administered 2018-06-10 – 2018-06-23 (×11): 11 [oz_av] via ORAL

## 2018-06-09 MED ORDER — APIXABAN 5 MG PO TABS
5.0000 mg | ORAL_TABLET | Freq: Two times a day (BID) | ORAL | Status: DC
  Administered 2018-06-09 – 2018-06-23 (×28): 5 mg via ORAL
  Filled 2018-06-09 (×29): qty 1

## 2018-06-09 MED ORDER — AMIODARONE HCL 200 MG PO TABS
200.0000 mg | ORAL_TABLET | Freq: Two times a day (BID) | ORAL | Status: DC
  Administered 2018-06-09 – 2018-06-23 (×25): 200 mg via ORAL
  Filled 2018-06-09 (×28): qty 1

## 2018-06-09 MED ORDER — INSULIN ASPART 100 UNIT/ML ~~LOC~~ SOLN: 0.0000 [IU] | Freq: Every day | SUBCUTANEOUS | Status: DC

## 2018-06-09 MED ORDER — METHYLPREDNISOLONE SODIUM SUCC 40 MG IJ SOLR
40.0000 mg | Freq: Three times a day (TID) | INTRAMUSCULAR | Status: AC
  Administered 2018-06-09 – 2018-06-10 (×3): 40 mg via INTRAVENOUS
  Filled 2018-06-09 (×3): qty 1

## 2018-06-09 MED ORDER — FUROSEMIDE 10 MG/ML IJ SOLN
80.0000 mg | Freq: Once | INTRAMUSCULAR | Status: AC
  Administered 2018-06-09: 80 mg via INTRAVENOUS
  Filled 2018-06-09: qty 8

## 2018-06-09 MED ORDER — RISAQUAD PO CAPS
1.0000 | ORAL_CAPSULE | Freq: Three times a day (TID) | ORAL | Status: DC
  Administered 2018-06-10 – 2018-06-23 (×38): 1 via ORAL
  Filled 2018-06-09 (×38): qty 1

## 2018-06-09 MED ORDER — VITAMIN C 500 MG PO TABS
250.0000 mg | ORAL_TABLET | Freq: Two times a day (BID) | ORAL | Status: DC
  Administered 2018-06-09 – 2018-06-23 (×22): 250 mg via ORAL
  Filled 2018-06-09 (×3): qty 1
  Filled 2018-06-09 (×2): qty 0.5
  Filled 2018-06-09: qty 1
  Filled 2018-06-09: qty 0.5
  Filled 2018-06-09 (×7): qty 1
  Filled 2018-06-09: qty 0.5
  Filled 2018-06-09 (×11): qty 1
  Filled 2018-06-09: qty 0.5
  Filled 2018-06-09: qty 1

## 2018-06-09 MED ORDER — ALBUTEROL SULFATE (2.5 MG/3ML) 0.083% IN NEBU
2.5000 mg | INHALATION_SOLUTION | RESPIRATORY_TRACT | Status: DC | PRN
  Administered 2018-06-09: 2.5 mg via RESPIRATORY_TRACT
  Filled 2018-06-09: qty 3

## 2018-06-09 MED ORDER — CALCIUM CARBONATE-VITAMIN D 500-200 MG-UNIT PO TABS
1.0000 | ORAL_TABLET | Freq: Two times a day (BID) | ORAL | Status: DC
  Administered 2018-06-09 – 2018-06-17 (×17): 1 via ORAL
  Administered 2018-06-18: 0.5 via ORAL
  Administered 2018-06-20 – 2018-06-21 (×2): 1 via ORAL
  Filled 2018-06-09 (×27): qty 1

## 2018-06-09 MED ORDER — DIVALPROEX SODIUM ER 250 MG PO TB24
250.0000 mg | ORAL_TABLET | Freq: Two times a day (BID) | ORAL | Status: DC
  Administered 2018-06-09 – 2018-06-23 (×27): 250 mg via ORAL
  Filled 2018-06-09 (×29): qty 1

## 2018-06-09 NOTE — H&P (Signed)
[facility] NAME: Vicki Morales    MR#:  [id number]  DATE OF BIRTH:  Jun 18, 1940  DATE OF ADMISSION:  06/09/2018  PRIMARY CARE PHYSICIAN: Birdie Sons, MD   REQUESTING/REFERRING PHYSICIAN:   CHIEF COMPLAINT:   Chief Complaint  Patient presents with  . Respiratory Distress    HISTORY OF PRESENT ILLNESS: Vicki Morales  is a 78 y.o. female with a known history of COPD, chronic congestive heart failure, restless leg syndrome, arthritis, anxiety disorder is a resident of facility.  And was found to be in respiratory distress with elevated respiratory rate at [facility] facility.  She had been short of breath since last night.  EMS gave her nebulization treatment and Solu-Medrol in route to the hospital.  Patient little lethargic.  Not able to give much history.  She was put on BiPAP and stabilized in the emergency room.  CO2 level is elevated.  She is also fluid overloaded.  PAST MEDICAL HISTORY:   Past Medical History:  Diagnosis Date  . Anxiety   . Arthritis   . Cataract    right eye  . COPD (chronic obstructive pulmonary disease) ([place])   . Depression   . Dyspnea    DOE  . Dysrhythmia   . Edema    FEET/ANKLES  . GERD (gastroesophageal reflux disease)   . H/O wheezing   . History of hiatal hernia   . HOH (hard of hearing)   . Hypertension   . Incontinence of urine in female   . Orthopnea   . Pain    CHRONIC KNEE  . Pain    CHRONIC KNEE  . Paranoid disorder ([place])   . RLS (restless legs syndrome)   . Tremors of nervous system    HANDS    PAST SURGICAL HISTORY:  Past Surgical History:  Procedure Laterality Date  . ABDOMINAL HYSTERECTOMY  1988  . APPENDECTOMY  1988  . CARDIAC CATHETERIZATION    . CATARACT EXTRACTION W/PHACO Right 07/14/2016   Procedure: CATARACT EXTRACTION PHACO AND INTRAOCULAR LENS PLACEMENT ([place]) anterior vitrectomy;  Surgeon: Estill Cotta, MD;  Location: [facility] ORS;  Service:  Ophthalmology;  Laterality: Right;  [place] 02:40 AP% 24.4 CDE 59.98 Fluid pack # [id number]  . CATARACT EXTRACTION W/PHACO Left 02/16/2017   Procedure: CATARACT EXTRACTION PHACO AND INTRAOCULAR LENS PLACEMENT (IOC);  Surgeon: Estill Cotta, MD;  Location: [facility] ORS;  Service: Ophthalmology;  Laterality: Left;  Lot # [id number] H [place]: 01:11.8 AP%: 23.5 CDE: 32.09   . ESOPHAGOGASTRODUODENOSCOPY N/A 11/11/2014   Procedure: ESOPHAGOGASTRODUODENOSCOPY (EGD);  Surgeon: Josefine Class, MD;  Location: [facility] ENDOSCOPY;  Service: Endoscopy;  Laterality: N/A;  . TONSILLECTOMY AND ADENOIDECTOMY    . TRIGGER FINGER RELEASE  2004    SOCIAL HISTORY:  Social History   Tobacco Use  . Smoking status: Former Smoker    Years: 30.00  . Smokeless tobacco: Never Used  . Tobacco comment: quit in 1989  Substance Use Topics  . Alcohol use: No    Alcohol/week: 0.0 standard drinks    FAMILY HISTORY:  Family History  Problem Relation Age of Onset  . Breast cancer Sister   . Stroke Father   . Emphysema Father   . Anxiety disorder Father   . Depression Father   . Anxiety disorder Brother     DRUG ALLERGIES:  Allergies  Allergen Reactions  . Arthrotec [Diclofenac-Misoprostol] Hives  . Codeine Other (See Comments)    Headache   .  Diclofenac Other (See Comments) and Hives    Pt unsure allergy  . Hydrochlorothiazide Other (See Comments)    Weakness   . Penicillins Hives    Has patient had a PCN reaction causing immediate rash, facial/tongue/throat swelling, SOB or lightheadedness with hypotension: No Has patient had a PCN reaction causing severe rash involving mucus membranes or skin necrosis: No Has patient had a PCN reaction that required hospitalization: No Has patient had a PCN reaction occurring within the last 10 years: No If all of the above answers are "NO", then may proceed with Cephalosporin use.  . Sulfa Antibiotics Hives  . Tiotropium Bromide Monohydrate Other (See Comments)    Blurry  vision   . Tylenol [Acetaminophen] Other (See Comments)    Doesn't work  . Morphine Hives and Rash  . Pantoprazole Rash    REVIEW OF SYSTEMS:   Not able to obtain much history secondary to lethargy  MEDICATIONS AT HOME:  Prior to Admission medications   Medication Sig Start Date End Date Taking? Authorizing Provider  acetaminophen (TYLENOL) 325 MG tablet Take 650 mg by mouth every 4 (four) hours as needed for mild pain or moderate pain.   Yes [provider]  acidophilus (RISAQUAD) CAPS capsule Take 1 capsule by mouth 3 (three) times daily with meals. 02/24/18  Yes Salary, Montell D, MD  albuterol (PROVENTIL) (2.5 MG/3ML) 0.083% nebulizer solution Take 3 mLs (2.5 mg total) by nebulization 2 (two) times daily as needed for wheezing or shortness of breath. 09/22/16  Yes Birdie Sons, MD  amiodarone (PACERONE) 200 MG tablet Take 1 tablet (200 mg total) by mouth 2 (two) times daily. 02/24/18  Yes Salary, Avel Peace, MD  apixaban (ELIQUIS) 5 MG TABS tablet Take 1 tablet (5 mg total) by mouth 2 (two) times daily. 02/24/18  Yes Salary, Avel Peace, MD  aspirin EC 81 MG EC tablet Take 1 tablet (81 mg total) by mouth daily. 02/25/18  Yes Salary, Avel Peace, MD  busPIRone (BUSPAR) 10 MG tablet Take 1 tablet (10 mg total) by mouth 3 (three) times daily. Patient taking differently: Take 15 mg by mouth 3 (three) times daily.  12/09/17  Yes Ursula Alert, MD  Calcium Carbonate-Vitamin D (CALCIUM 600+D) 600-400 MG-UNIT tablet Take 1 tablet by mouth 2 (two) times daily.    Yes [provider]  Cranberry 250 MG CAPS Take 1 capsule by mouth daily.    Yes [provider]  cyanocobalamin 1000 MCG tablet Take 1,000 mcg by mouth daily.   Yes [provider]  digoxin (LANOXIN) 0.125 MG tablet Take 0.5 tablets (0.0625 mg total) by mouth daily. 02/24/18  Yes Salary, Avel Peace, MD  divalproex (DEPAKOTE ER) 250 MG 24 hr tablet Take 250 mg by mouth 2 (two) times daily.   Yes [provider]  feeding supplement, ENSURE ENLIVE, (ENSURE ENLIVE) LIQD Take 237 mLs by mouth 2 (two) times daily between meals. 02/24/18  Yes Salary, Avel Peace, MD  fluticasone (FLONASE) 50 MCG/ACT nasal spray Place 1 spray into both nostrils daily.   Yes [provider]  Fluticasone-Umeclidin-Vilant (TRELEGY ELLIPTA) 100-62.5-25 MCG/INH AEPB Inhale 1 puff into the lungs daily. 11/08/17  Yes Birdie Sons, MD  furosemide (LASIX) 40 MG tablet Take 40 mg by mouth daily.   Yes [provider]  gabapentin (NEURONTIN) 100 MG capsule Take 1 capsule (100 mg total) by mouth 2 (two) times daily with a meal. 03/20/18  Yes Epifanio Lesches, MD  gabapentin (NEURONTIN) 600  MG tablet Take 1 tablet (600 mg total) by mouth at bedtime. 03/20/18  Yes Epifanio Lesches, MD  loperamide (IMODIUM) 2 MG capsule Take 1 capsule (2 mg total) by mouth as needed for diarrhea or loose stools. 02/24/18  Yes Salary, Avel Peace, MD  magnesium oxide (MAG-OX) 400 (241.3 Mg) MG tablet Take 1 tablet (400 mg total) by mouth 2 (two) times daily. 03/20/18  Yes Epifanio Lesches, MD  meclizine (ANTIVERT) 12.5 MG tablet Take 12.5 mg by mouth 2 (two) times daily as needed for dizziness.   Yes [provider]  Melatonin 10 MG CAPS Take 10 mg by mouth at bedtime.    Yes [provider]  midodrine (PROAMATINE) 10 MG tablet Take 1 tablet (10 mg total) by mouth 3 (three) times daily with meals. 03/20/18  Yes Epifanio Lesches, MD  montelukast (SINGULAIR) 10 MG tablet TAKE 1 TABLET(10 MG) BY MOUTH EVERY NIGHT Patient taking differently: Take 10 mg by mouth at bedtime.  01/29/18  Yes Birdie Sons, MD  Multiple Vitamin (MULTIVITAMIN WITH MINERALS) TABS tablet Take 1 tablet by mouth daily.   Yes [provider]  omega-3 acid ethyl esters (LOVAZA) 1 g capsule Take 1 capsule (1 g total) by mouth daily. 03/20/18  Yes Epifanio Lesches, MD  oxybutynin (DITROPAN-XL) 10 MG 24 hr tablet TAKE 1  TABLET BY MOUTH AT BEDTIME Patient taking differently: Take 10 mg by mouth at bedtime.  08/21/17  Yes Birdie Sons, MD  protein supplement shake (PREMIER PROTEIN) LIQD Take 325 mLs (11 oz total) by mouth 2 (two) times daily between meals. 03/20/18  Yes Epifanio Lesches, MD  risperiDONE (RISPERDAL) 1 MG tablet Take 1 tablet (1 mg total) by mouth at bedtime. 12/09/17  Yes Ursula Alert, MD  Saccharomyces boulardii (PROBIOTIC) 250 MG CAPS Take 1 capsule by mouth 3 times/day as needed-between meals & bedtime. Patient taking differently: Take 1 capsule by mouth 4 (four) times daily -  with meals and at bedtime.  02/09/18  Yes Merlyn Lot, MD  theophylline (THEO-24) 200 MG 24 hr capsule Take 1 capsule (200 mg total) by mouth daily. 12/07/17  Yes Birdie Sons, MD  ALPRAZolam Duanne Moron) 0.5 MG tablet Take 1 tablet (0.5 mg total) by mouth at bedtime as needed for anxiety. 03/20/18   Epifanio Lesches, MD  budesonide (PULMICORT) 0.5 MG/2ML nebulizer solution Take 2 mLs (0.5 mg total) by nebulization 2 (two) times daily. 03/20/18   Epifanio Lesches, MD  guaiFENesin (MUCINEX) 600 MG 12 hr tablet Take 1 tablet (600 mg total) by mouth 2 (two) times daily. 02/24/18   Salary, Avel Peace, MD  hydrALAZINE (APRESOLINE) 10 MG tablet Take 1 tablet (10 mg total) by mouth 2 (two) times daily for 4 days. Hold if BP is <100/70 03/20/18 03/24/18  Epifanio Lesches, MD  vancomycin (VANCOCIN) 50 mg/mL oral solution Take 2.5 mLs (125 mg total) by mouth every 6 (six) hours. For 9 days 03/20/18   Epifanio Lesches, MD  vitamin C (VITAMIN C) 250 MG tablet Take 1 tablet (250 mg total) by mouth 2 (two) times daily. 03/20/18   Epifanio Lesches, MD      PHYSICAL EXAMINATION:   VITAL SIGNS: Blood pressure (!) 114/54, pulse 83, temperature 98 F (36.7 C), temperature source Axillary, resp. rate 18, height 5\' 4"  (1.626 m), weight 111.1 kg, SpO2 96 %.  GENERAL:  78 y.o.-year-old patient lying in the bed on  BiPAP IPAP 14 EPAP 5 Rate 10 FiO2 40% EYES: Pupils equal, round, reactive  to light and accommodation. No scleral icterus. Extraocular muscles intact.  HEENT: Head atraumatic, normocephalic. Oropharynx and nasopharynx clear.  NECK:  Supple, no jugular venous distention. No thyroid enlargement, no tenderness.  LUNGS: Decreased breath sounds bilaterally, bibasilar crepitations.  On BiPAP CARDIOVASCULAR: S1, S2 normal. No murmurs, rubs, or gallops.  ABDOMEN: Soft, nontender, nondistended. Bowel sounds present. No organomegaly or mass.  EXTREMITIES: Has pedal edema,  No cyanosis, or clubbing.  NEUROLOGIC: Arousable to loud verbal commands Moves all extremities PSYCHIATRIC: The patient is alert and oriented x 2 SKIN: No obvious rash, lesion, or ulcer.   LABORATORY PANEL:   CBC Recent Labs  Lab 06/09/18 1024  WBC 12.0*  HGB 11.5*  HCT 37.3  PLT 262  MCV 95.9  MCH 29.6  MCHC 30.8  RDW 14.7  LYMPHSABS 0.3*  MONOABS 0.8  EOSABS 0.0  BASOSABS 0.0   ------------------------------------------------------------------------------------------------------------------  Chemistries  Recent Labs  Lab 06/09/18 1024  NA 128*  K 4.0  CL 89*  CO2 30  GLUCOSE 171*  BUN 11  CREATININE 0.81  CALCIUM 8.6*   ------------------------------------------------------------------------------------------------------------------ estimated creatinine clearance is 68.7 mL/min (by C-G formula based on SCr of 0.81 mg/dL). ------------------------------------------------------------------------------------------------------------------ No results for input(s): TSH, T4TOTAL, T3FREE, THYROIDAB in the last 72 hours.  Invalid input(s): FREET3   Coagulation profile No results for input(s): INR, PROTIME in the last 168 hours. ------------------------------------------------------------------------------------------------------------------- No results for input(s): DDIMER in the last 72  hours. -------------------------------------------------------------------------------------------------------------------  Cardiac Enzymes Recent Labs  Lab 06/09/18 1024  TROPONINI <0.03   ------------------------------------------------------------------------------------------------------------------ Invalid input(s): POCBNP  ---------------------------------------------------------------------------------------------------------------  Urinalysis    Component Value Date/Time   COLORURINE YELLOW (A) 02/19/2018 1344   APPEARANCEUR CLEAR (A) 02/19/2018 1344   APPEARANCEUR Clear 09/25/2014 1019   LABSPEC 1.013 02/19/2018 1344   LABSPEC 1.004 09/25/2014 1019   PHURINE 8.0 02/19/2018 1344   GLUCOSEU NEGATIVE 02/19/2018 1344   GLUCOSEU Negative 09/25/2014 1019   HGBUR NEGATIVE 02/19/2018 1344   BILIRUBINUR NEGATIVE 02/19/2018 1344   BILIRUBINUR negative 01/20/2018 1423   BILIRUBINUR Negative 09/25/2014 1019   KETONESUR 20 (A) 02/19/2018 1344   PROTEINUR NEGATIVE 02/19/2018 1344   UROBILINOGEN 0.2 01/20/2018 1423   NITRITE NEGATIVE 02/19/2018 1344   LEUKOCYTESUR NEGATIVE 02/19/2018 1344   LEUKOCYTESUR 2+ 09/25/2014 1019     RADIOLOGY: Dg Chest Port 1 View  Result Date: 06/09/2018 CLINICAL DATA:  Shortness of breath. EXAM: PORTABLE CHEST 1 VIEW COMPARISON:  03/13/2018. FINDINGS: Cardiomegaly with pulmonary venous congestion bilateral interstitial prominence. No pleural effusion. No pneumothorax. No acute bony abnormality. IMPRESSION: Cardiomegaly with pulmonary venous congestion and mild bilateral from interstitial prominence suggesting CHF. Mild pneumonitis can not be excluded. A component of underlying chronic interstitial lung disease may also be present. Electronically Signed   By: Marcello Moores  Register   On: 06/09/2018 10:36    EKG: Orders placed or performed during the hospital encounter of 06/09/18  . ED EKG  . ED EKG  . EKG 12-Lead  . EKG 12-Lead    IMPRESSION AND  PLAN:  78 year old female patient with a known history of COPD, chronic congestive heart failure, restless leg syndrome, arthritis, anxiety disorder is a resident of facility presented to emergency room for shortness of breath and lethargy  -Acute hypercapnic respiratory failure Continue BiPAP for respiratory distress Nebulization therapy  -Acute on chronic respiratory failure with hypercapnia BiPAP to continue Intensivist consultation  -Acute on chronic congestive heart failure exacerbation IV Lasix for diuresis Monitor electrolytes Daily body weight input output  -Altered mental status probably  secondary to hypercapnia  -Acute COPD exacerbation IV Solu-Medrol 60 MDQ 6 hourly Oxygen via BiPAP Aggressive nebulization therapy  All the records are reviewed and case discussed with ED provider. Management plans discussed with the patient, family and they are in agreement.  CODE STATUS:DNR Code Status History    Date Active Date Inactive Code Status Order ID Comments User Context   03/05/2018 1338 03/20/2018 1812 DNR [id number]  Epifanio Lesches, MD ED   02/19/2018 1709 02/26/2018 1634 DNR [id number]  Loletha Grayer, MD ED   12/03/2017 1258 12/06/2017 1847 DNR [id number]  Gladstone Lighter, MD Inpatient   10/24/2017 1844 10/26/2017 1623 DNR [id number]  Vaughan Basta, MD Inpatient   09/23/2015 0018 09/23/2015 1823 DNR [id number]  Lance Coon, MD Inpatient    Questions for Most Recent Historical Code Status (Order [id number])    Question Answer Comment   In the event of cardiac or respiratory ARREST Do not call a "code blue"    In the event of cardiac or respiratory ARREST Do not perform Intubation, CPR, defibrillation or ACLS    In the event of cardiac or respiratory ARREST Use medication by any route, position, wound care, and other measures to relive pain and suffering. May use oxygen, suction and manual treatment of airway obstruction as needed for comfort.    Comments nurse  may pronounce         Advance Directive Documentation     Most Recent Value  Type of Advance Directive  Out of facility DNR (pink MOST or yellow form)  Pre-existing out of facility DNR order (yellow form or pink MOST form)  -  "MOST" Form in Place?  -       TOTAL TIME TAKING CARE OF THIS PATIENT: 54 minutes.    Saundra Shelling M.D on 06/09/2018 at 1:02 PM  Between 7am to 6pm - Pager - [phone]  After 6pm go to [email or web address] - password [email or web address] [facility]  Office  [phone]  CC: Primary care physician; Birdie Sons, MD

## 2018-06-09 NOTE — Progress Notes (Signed)
eLink Physician-Brief Progress Note Patient Name: Vicki Morales DOB: [birth year] MRN: [id number]   Date of Service  06/09/2018  neICU Interventions  1. Levoflox 750 mg IV for ? CAP. Blood cx x 2 2. Follow ABG on BiPAP. 3. Influenza panel. 4. Solumedrol IV. 5. On SSI, apixaban, amiodarone.  DNR  H and P reviewed Will not do 30 cc/kg at this time. CHF.     Intervention Category Major Interventions: Respiratory failure - evaluation and management Intermediate Interventions: Best-practice therapies (e.g. DVT, beta blocker, etc.) Minor Interventions: Electrolytes abnormality - evaluation and management  Elmer Sow 06/09/2018, 9:07 PM

## 2018-06-09 NOTE — Progress Notes (Signed)
Advanced care plan.  Purpose of the Encounter: CODE STATUS  Parties in Attendance: Patient and family  Patient's Decision Capacity: Not good  Subjective/Patient's story: Patient presented to the emergency room for shortness of breath and confusion   Objective/Medical story She has COPD exacerbation along with fluid overload Needs BiPAP for respiratory distress and Lasix for diuresis and aggressive nebulization treatment   Goals of care determination:  Advance care directives goals of care and treatment plan discussed Patient and family do not want CPR, intubation ventilator if the need arises   CODE STATUS: DNR   Time spent discussing advanced care planning: 16 minutes

## 2018-06-09 NOTE — ED Notes (Signed)
Pt taken off bipap for trial, placed on NRB at 8L. O2 sat is 97% at this time. Daughter at bedside.

## 2018-06-09 NOTE — ED Provider Notes (Signed)
[facility] Emergency Department Provider Note       Time seen: ----------------------------------------- 10:38 AM on 06/09/2018 -----------------------------------------  Level V caveat: History/ROS limited by altered mental status, respiratory distress I have reviewed the triage vital signs and the nursing notes.  HISTORY   Chief Complaint Respiratory Distress    HPI Vicki Morales is a 78 y.o. female with a history of anxiety, COPD, depression, dyspnea, hypertension who presents to the ED for respiratory distress.  40 to EMS she is from [facility].  She has been short of breath since last night.  She was given IM Lasix and sublingual nitroglycerin.  EMS gave 1 DuoNeb and 1 albuterol nebulizer treatment with Solu-Medrol.  She is sleepy but responsive on arrival.  Past Medical History:  Diagnosis Date  . Anxiety   . Arthritis   . Cataract    right eye  . COPD (chronic obstructive pulmonary disease) ([place])   . Depression   . Dyspnea    DOE  . Dysrhythmia   . Edema    FEET/ANKLES  . GERD (gastroesophageal reflux disease)   . H/O wheezing   . History of hiatal hernia   . HOH (hard of hearing)   . Hypertension   . Incontinence of urine in female   . Orthopnea   . Pain    CHRONIC KNEE  . Pain    CHRONIC KNEE  . Paranoid disorder ([place])   . RLS (restless legs syndrome)   . Tremors of nervous system    HANDS    Patient Active Problem List   Diagnosis Date Noted  . Acute and chronic respiratory failure with hypercapnia ([place]) 03/05/2018  . COPD exacerbation ([place]) 02/19/2018  . Diastolic dysfunction 60/45/4098  . Sepsis ([place]) 12/03/2017  . 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 COPD with acute exacerbation ([place]) 10/24/2017  . Nocturnal hypoxia 08/21/2016  . Restless leg syndrome 08/20/2016  . Impingement syndrome of shoulder region 07/29/2016  . Hyponatremia 03/21/2016  . Pneumonia 03/18/2016  . History of suicide  attempt 03/16/2016  . Overdose 03/16/2016  . Pulmonary hypertension ([place]) 11/11/2015  . Anxiety 09/22/2015  . Depression 09/22/2015  . Osteoarthritis 09/10/2015  . Rosacea 07/03/2015  . Breast pain 11/07/2014  . Callus of foot 11/07/2014  . CN (constipation) 11/07/2014  . Dermatitis, eczematoid 11/07/2014  . Diverticulosis of colon 11/07/2014  . Dizziness 11/07/2014  . Can't get food down 11/07/2014  . Accumulation of fluid in tissues 11/07/2014  . Fatigue 11/07/2014  . FOM (frequency of micturition) 11/07/2014  . Tension type headache 11/07/2014  . LBP (low back pain) 11/07/2014  . Episodic mood disorder ([place]) 11/07/2014  . Extreme obesity 11/07/2014  . Muscle ache 11/07/2014  . Disturbance of skin sensation 11/07/2014  . Awareness of heartbeats 11/07/2014  . Jerking 11/07/2014  . Body tinea 11/07/2014  . Essential (primary) hypertension 10/10/2014  . Moderate COPD (chronic obstructive pulmonary disease) ([place]) 04/01/2014  . Hx of obesity 04/01/2014  . CAFL (chronic airflow limitation) ([place]) 01/25/2009  . Malaise and fatigue 11/26/2008  . Aphasia 09/26/2008  . 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 GERD (gastroesophageal reflux disease) 06/07/1998  . HLD (hyperlipidemia) 06/07/1998  . OP (osteoporosis) 06/07/1998  . H/O total hysterectomy 03/08/1987  . Schizophrenia, in remission ([place]) 06/07/1978    Past Surgical History:  Procedure Laterality Date  . ABDOMINAL HYSTERECTOMY  1988  . APPENDECTOMY  1988  . CARDIAC CATHETERIZATION    . CATARACT EXTRACTION W/PHACO Right  07/14/2016   Procedure: CATARACT EXTRACTION PHACO AND INTRAOCULAR LENS PLACEMENT (IOC) anterior vitrectomy;  Surgeon: Estill Cotta, MD;  Location: [facility] ORS;  Service: Ophthalmology;  Laterality: Right;  [place] 02:40 AP% 24.4 CDE 59.98 Fluid pack # [id number]  . CATARACT EXTRACTION W/PHACO Left 02/16/2017   Procedure: CATARACT EXTRACTION PHACO AND INTRAOCULAR LENS PLACEMENT (IOC);  Surgeon:  Estill Cotta, MD;  Location: [facility] ORS;  Service: Ophthalmology;  Laterality: Left;  Lot # [id number] H [place]: 01:11.8 AP%: 23.5 CDE: 32.09   . ESOPHAGOGASTRODUODENOSCOPY N/A 11/11/2014   Procedure: ESOPHAGOGASTRODUODENOSCOPY (EGD);  Surgeon: Josefine Class, MD;  Location: [facility] ENDOSCOPY;  Service: Endoscopy;  Laterality: N/A;  . TONSILLECTOMY AND ADENOIDECTOMY    . TRIGGER FINGER RELEASE  2004    Allergies Arthrotec [diclofenac-misoprostol]; Codeine; Diclofenac; Hydrochlorothiazide; Penicillins; Sulfa antibiotics; Tiotropium bromide monohydrate; Tylenol [acetaminophen]; Morphine; and Pantoprazole  Social History Social History   Tobacco Use  . Smoking status: Former Smoker    Years: 30.00  . Smokeless tobacco: Never Used  . Tobacco comment: quit in 1989  Substance Use Topics  . Alcohol use: No    Alcohol/week: 0.0 standard drinks  . Drug use: No   Review of Systems Unknown, positive for altered mental status  All systems negative/normal/unremarkable except as stated in the HPI  ____________________________________________   PHYSICAL EXAM:  VITAL SIGNS: ED Triage Vitals  Enc Vitals Group     BP 06/09/18 1028 (!) 144/70     Pulse Rate 06/09/18 1028 (!) 102     Resp 06/09/18 1028 (!) 21     Temp 06/09/18 1028 98 F (36.7 C)     Temp Source 06/09/18 1028 Axillary     SpO2 06/09/18 1028 97 %     Weight 06/09/18 1034 245 lb (111.1 kg)     Height 06/09/18 1034 5\' 4"  (1.626 m)     Head Circumference --      Peak Flow --      Pain Score 06/09/18 1029 0     Pain Loc --      Pain Edu? --      Excl. in [place]? --    Constitutional: Lethargic but oriented.  Moderate distress ENT      Head: Normocephalic and atraumatic.      Nose: No congestion/rhinnorhea.      Mouth/Throat: Mucous membranes are moist.      Neck: No stridor. Cardiovascular: Normal rate, regular rhythm. No murmurs, rubs, or gallops. Respiratory: Tachypnea with rales bilaterally Gastrointestinal: Soft  and nontender. Normal bowel sounds Musculoskeletal: Nontender with normal range of motion in extremities.  Bilateral edema is noted Neurologic:  Normal speech and language. No gross focal neurologic deficits are appreciated.  Skin:  Skin is warm, dry and intact. No rash noted. Psychiatric: Mood and affect are normal. Speech and behavior are normal.  ____________________________________________  EKG: Interpreted by me.  Atrial fibrillation with a rate of 94 bpm, incomplete right bundle branch block, left anterior fascicular block, long QT  ____________________________________________  ED COURSE:  As part of my medical decision making, I reviewed the following data within the [place] History obtained from family if available, nursing notes, old chart and ekg, as well as notes from prior ED visits. Patient presented for shortness of breath and lethargy, we will assess with labs and imaging as indicated at this time.   Procedures ____________________________________________   LABS (pertinent positives/negatives)  Labs Reviewed  CBC WITH DIFFERENTIAL/PLATELET - Abnormal; Notable for the following components:  Result Value   WBC 12.0 (*)    Hemoglobin 11.5 (*)    Neutro Abs 10.8 (*)    Lymphs Abs 0.3 (*)    Abs Immature Granulocytes 0.08 (*)    All other components within normal limits  BLOOD GAS, VENOUS - Abnormal; Notable for the following components:   pCO2, Ven 83 (*)    pO2, Ven 59.0 (*)    Bicarbonate 37.2 (*)    Acid-Base Excess 7.0 (*)    All other components within normal limits  BASIC METABOLIC PANEL  BRAIN NATRIURETIC PEPTIDE  TROPONIN I  CG4 I-STAT (LACTIC ACID)  I-STAT CG4 LACTIC ACID, ED   CRITICAL CARE Performed by: Laurence Aly   Total critical care time: 30 minutes  Critical care time was exclusive of separately billable procedures and treating other patients.  Critical care was necessary to treat or prevent imminent or  life-threatening deterioration.  Critical care was time spent personally by me on the following activities: development of treatment plan with patient and/or surrogate as well as nursing, discussions with consultants, evaluation of patient's response to treatment, examination of patient, obtaining history from patient or surrogate, ordering and performing treatments and interventions, ordering and review of laboratory studies, ordering and review of radiographic studies, pulse oximetry and re-evaluation of patient's condition. RADIOLOGY Images were viewed by me  Chest x-ray IMPRESSION: Cardiomegaly with pulmonary venous congestion and mild bilateral from interstitial prominence suggesting CHF. Mild pneumonitis can not be excluded. A component of underlying chronic interstitial lung disease may also be present. ____________________________________________   DIFFERENTIAL DIAGNOSIS   CHF, COPD, pneumonia, influenza, pneumothorax, PE  FINAL ASSESSMENT AND PLAN  Dyspnea, acute hypercarbic respiratory failure, CHF exacerbation   Plan: The patient had presented for shortness of breath that started last night. Patient's labs reveal significant CO2 retention more indicative of COPD. Patient's imaging revealed cardiomegaly with pulmonary vascular congestion suggesting CHF.  This is likely a combination of COPD and CHF.  Currently she is improving on BiPAP, I will order IV Lasix for diuresis.  She will remain on BiPAP.  Lactic acid level is negative.  I will discuss with the hospitalist for admission.   Laurence Aly, MD    Note: This note was generated in part or whole with voice recognition software. Voice recognition is usually quite accurate but there are transcription errors that can and very often do occur. I apologize for any typographical errors that were not detected and corrected.     Earleen Newport, MD 06/09/18 1059

## 2018-06-09 NOTE — ED Triage Notes (Signed)
Pt via ems from [facility]; sob started last night. Pt was given 40 mg im lasix; 2 x sl nitro. EMS gave 1 duoneb, 1 albuterol, 125 solumedrol. Pt responsive to voice, otherwise sleepy.

## 2018-06-09 NOTE — ED Notes (Signed)
Patient oxygen saturations 86%-88% on 4LNC, patient tachypnic, respiratory contacted to place patient on Bipap.

## 2018-06-09 NOTE — Progress Notes (Signed)
Pt was placed on a 3L nasal cannula. She was wheezing so an albuterol neblizer treatment was given. She had labored breathing after the treatment so I placed her back on bipap.

## 2018-06-09 NOTE — Progress Notes (Signed)
Pt looked better and stated she felt better. I took her off bipap and placed her on a 3 L nasal cannula. Sa02 is 94%.

## 2018-06-10 ENCOUNTER — Other Ambulatory Visit: Payer: Self-pay

## 2018-06-10 DIAGNOSIS — J9601 Acute respiratory failure with hypoxia: Secondary | ICD-10-CM

## 2018-06-10 LAB — BASIC METABOLIC PANEL
Anion gap: 7 (ref 5–15)
BUN: 18 mg/dL (ref 8–23)
CO2: 35 mmol/L — ABNORMAL HIGH (ref 22–32)
Calcium: 8.4 mg/dL — ABNORMAL LOW (ref 8.9–10.3)
Chloride: 91 mmol/L — ABNORMAL LOW (ref 98–111)
Creatinine, Ser: 0.82 mg/dL (ref 0.44–1.00)
GFR calc Af Amer: >60 mL/min (ref >60)
GFR calc non Af Amer: >60 mL/min (ref >60)
Glucose, Bld: 139 mg/dL — ABNORMAL HIGH (ref 70–99)
Potassium: 3.9 mmol/L (ref 3.5–5.1)
Sodium: 133 mmol/L — ABNORMAL LOW (ref 135–145)

## 2018-06-10 LAB — GLUCOSE, CAPILLARY
Glucose-Capillary: 113 mg/dL — ABNORMAL HIGH (ref 70–99)
Glucose-Capillary: 116 mg/dL — ABNORMAL HIGH (ref 70–99)
Glucose-Capillary: 115 mg/dL — ABNORMAL HIGH (ref 70–99)
Glucose-Capillary: 124 mg/dL — ABNORMAL HIGH (ref 70–99)

## 2018-06-10 LAB — CBC
HEMATOCRIT: 34.3 % — AB (ref 36.0–46.0)
Hemoglobin: 10.5 g/dL — ABNORMAL LOW (ref 12.0–15.0)
MCH: 29.4 pg (ref 26.0–34.0)
MCHC: 30.6 g/dL (ref 30.0–36.0)
MCV: 96.1 fL (ref 80.0–100.0)
PLATELETS: 234 10*3/uL (ref 150–400)
RBC: 3.57 MIL/uL — AB (ref 3.87–5.11)
RDW: 14.3 % (ref 11.5–15.5)
WBC: 9.8 10*3/uL (ref 4.0–10.5)
nRBC: 0 % (ref 0.0–0.2)

## 2018-06-10 LAB — INFLUENZA PANEL BY PCR (TYPE A & B)
Influenza A By PCR: NEGATIVE
Influenza B By PCR: NEGATIVE

## 2018-06-10 MED ORDER — CHLORHEXIDINE GLUCONATE 0.12 % MT SOLN
15.0000 mL | Freq: Two times a day (BID) | OROMUCOSAL | Status: DC
  Administered 2018-06-10 – 2018-06-23 (×24): 15 mL via OROMUCOSAL
  Filled 2018-06-10 (×27): qty 15

## 2018-06-10 MED ORDER — METOPROLOL TARTRATE 5 MG/5ML IV SOLN
5.0000 mg | Freq: Four times a day (QID) | INTRAVENOUS | Status: DC | PRN
  Administered 2018-06-10: 5 mg via INTRAVENOUS

## 2018-06-10 MED ORDER — HYDRALAZINE HCL 50 MG PO TABS
25.0000 mg | ORAL_TABLET | Freq: Two times a day (BID) | ORAL | Status: DC
  Administered 2018-06-10 – 2018-06-18 (×13): 25 mg via ORAL
  Filled 2018-06-10 (×17): qty 1

## 2018-06-10 MED ORDER — METOPROLOL TARTRATE 5 MG/5ML IV SOLN
INTRAVENOUS | Status: AC
  Administered 2018-06-10: 5 mg via INTRAVENOUS
  Filled 2018-06-10: qty 5

## 2018-06-10 MED ORDER — HYDRALAZINE HCL 20 MG/ML IJ SOLN
10.0000 mg | INTRAMUSCULAR | Status: DC | PRN
  Administered 2018-06-11: 10 mg via INTRAVENOUS
  Filled 2018-06-10: qty 1

## 2018-06-10 MED ORDER — METOPROLOL TARTRATE 5 MG/5ML IV SOLN: 5.0000 mg | Freq: Four times a day (QID) | INTRAVENOUS | Status: DC

## 2018-06-10 MED ORDER — LEVOFLOXACIN IN D5W 750 MG/150ML IV SOLN
750.0000 mg | INTRAVENOUS | Status: DC
  Administered 2018-06-10: 750 mg via INTRAVENOUS
  Filled 2018-06-10 (×2): qty 150

## 2018-06-10 MED ORDER — ORAL CARE MOUTH RINSE
15.0000 mL | Freq: Two times a day (BID) | OROMUCOSAL | Status: DC
  Administered 2018-06-10 – 2018-06-23 (×19): 15 mL via OROMUCOSAL

## 2018-06-10 NOTE — Progress Notes (Signed)
Patient from ED on BiPAP; remained on BiPAP throughout the shift. Patient having minimal output despite furosemide dose; one time order for straight cath placed and retrieved only 225 of the 377 in bladder. Per Dr. Prudencio Burly, re-scan bladder in AM. Patient resting comfortably at this time; will continue to monitor and assess.

## 2018-06-10 NOTE — Plan of Care (Signed)
Pt with 4 liters  this shift, strong productive cough, she is forgetful and somewhat inappropriate at times but pleasant and easily reoriented/instructed.  Purewick in place w/ 1200 cc out.  Oral intake excellent.  IV lopressor started for hypertension.  No midodrine administered, Dr conforti aware.

## 2018-06-10 NOTE — Progress Notes (Signed)
[place] at [place] NAME: Vicki Morales    MR#:  [id number]  DATE OF BIRTH:  March 29, 1941  SUBJECTIVE:   States she is doing fine this morning.  Her shortness of breath has improved.  She was able to come off the BiPAP and has been successfully transitioned to 4 L O2 by nasal cannula.  She endorses cough.  No chest pain.  REVIEW OF SYSTEMS:  Review of Systems  Constitutional: Negative for chills and fever.  HENT: Negative for congestion and sore throat.   Eyes: Negative for blurred vision and double vision.  Respiratory: Positive for cough and shortness of breath.   Cardiovascular: Negative for chest pain and leg swelling.  Gastrointestinal: Negative for nausea and vomiting.  Genitourinary: Negative for dysuria and urgency.  Musculoskeletal: Negative for back pain and neck pain.  Neurological: Negative for dizziness and headaches.  Psychiatric/Behavioral: Negative for depression. The patient is not nervous/anxious.     DRUG ALLERGIES:   Allergies  Allergen Reactions  . Arthrotec [Diclofenac-Misoprostol] Hives  . Codeine Other (See Comments)    Headache   . Diclofenac Other (See Comments) and Hives    Pt unsure allergy  . Hydrochlorothiazide Other (See Comments)    Weakness   . Penicillins Hives    Has patient had a PCN reaction causing immediate rash, facial/tongue/throat swelling, SOB or lightheadedness with hypotension: No Has patient had a PCN reaction causing severe rash involving mucus membranes or skin necrosis: No Has patient had a PCN reaction that required hospitalization: No Has patient had a PCN reaction occurring within the last 10 years: No If all of the above answers are "NO", then may proceed with Cephalosporin use.  . Sulfa Antibiotics Hives  . Tiotropium Bromide Monohydrate Other (See Comments)    Blurry vision   . Tylenol [Acetaminophen] Other (See Comments)    Doesn't work  . Morphine Hives and Rash  .  Pantoprazole Rash   VITALS:  Blood pressure (!) 151/60, pulse (!) 130, temperature 97.6 F (36.4 C), temperature source Axillary, resp. rate (!) 23, height 5\' 5"  (1.651 m), weight 109.2 kg, SpO2 (!) 89 %. PHYSICAL EXAMINATION:  Physical Exam  GENERAL:  78 y.o.-year-old patient lying in the bed, in NAD EYES: Pupils equal, round, reactive to light and accommodation. No scleral icterus. Extraocular muscles intact.  HEENT: Head atraumatic, normocephalic. Oropharynx and nasopharynx clear.  NECK:  Supple, no jugular venous distention. No thyroid enlargement, no tenderness.  LUNGS: Decreased breath sounds bilaterally, bibasilar  crackles present.  Nasal cannula in place.  Able to speak in full sentences.  Normal work of breathing. CARDIOVASCULAR: S1, S2 normal. No murmurs, rubs, or gallops.  ABDOMEN: Soft, nontender, nondistended. Bowel sounds present. No organomegaly or mass.  EXTREMITIES: +pedal edema,  No cyanosis, or clubbing.  NEUROLOGIC: Arousable to loud verbal commands Moves all extremities PSYCHIATRIC: The patient is alert and oriented x 2 SKIN: No obvious rash, lesion, or ulcer.  LABORATORY PANEL:  Female CBC Recent Labs  Lab 06/10/18 0452  WBC 9.8  HGB 10.5*  HCT 34.3*  PLT 234   ------------------------------------------------------------------------------------------------------------------ Chemistries  Recent Labs  Lab 06/10/18 0452  NA 133*  K 3.9  CL 91*  CO2 35*  GLUCOSE 139*  BUN 18  CREATININE 0.82  CALCIUM 8.4*   RADIOLOGY:  No results found. ASSESSMENT AND PLAN:   Acute hypercapnic/hypoxemic respiratory failure- secondary to CHF and COPD exacerbation.  Initially requiring BiPAP, but has been transitioned to  4 L nasal cannula. -Wean O2 as able -Duo nebs as needed  Acute on chronic diastolic congestive heart failure exacerbation-improving -Continue IV Lasix -Monitor electrolytes -Daily weights, strict I/O -ECHO  Altered mental status probably  secondary to hypercapnia-improving -Monitor  Acute COPD exacerbation-improving -Continue IV Solu-Medrol -DuoNebs as needed  Normocytic anemia-hemoglobin 10.5.  May be dilutional secondary to volume overload state. -Monitor  All the records are reviewed and case discussed with Care Management/Social Worker. Management plans discussed with the patient, family and they are in agreement.  CODE STATUS: DNR  TOTAL TIME TAKING CARE OF THIS PATIENT: 40 minutes.   More than 50% of the time was spent in counseling/coordination of care: YES  POSSIBLE D/C IN 2-3 DAYS, DEPENDING ON CLINICAL CONDITION.   [name] [name] M.D on 06/10/2018 at 6:46 PM  Between 7am to 6pm - Pager - [phone]  After 6pm go to [email or web address] - Proofreader  [facility]  Office  [phone]  CC: Primary care physician; Vicki Sons, MD  Note: This dictation was prepared with Dragon dictation along with smaller phrase technology. Any transcriptional errors that result from this process are unintentional.

## 2018-06-10 NOTE — Consult Note (Signed)
CRITICAL CARE NOTE  CC  Admitted for resp failure from NH to ICU  HPI 78 yo WF DNR/DNI from NH +h/o dCHF +increased WOB and SOB 24 hrs PTA Unable to provide ROS-lethargy ABG with acute on chronic hypercapnic resp failure  CXR c/w pulm edema interstitial infiltrates  Placed on biPAP, given lasix    BP 119/62   Pulse 72   Temp 98.3 F (36.8 C) (Axillary)   Resp 17   Ht 5\' 5"  (1.651 m)   Wt 109.2 kg   SpO2 98%   BMI 40.06 kg/m    REVIEW OF SYSTEMS  PATIENT IS UNABLE TO PROVIDE COMPLETE REVIEW OF SYSTEMS DUE TO SEVERE CRITICAL ILLNESS   PHYSICAL EXAMINATION:  GENERAL:critically ill appearing, +resp distress HEAD: Normocephalic, atraumatic.  EYES: Pupils equal, round, reactive to light.  No scleral icterus.  MOUTH: Moist mucosal membrane. NECK: Supple. No thyromegaly. No nodules. No JVD.  PULMONARY: +rhonchi, +wheezing CARDIOVASCULAR: S1 and S2. Regular rate and rhythm. No murmurs, rubs, or gallops.  GASTROINTESTINAL: Soft, nontender, -distended. No masses. Positive bowel sounds. No hepatosplenomegaly.  MUSCULOSKELETAL: No swelling, clubbing, or edema.  NEUROLOGIC: obtunded, GCS<8 SKIN:intact,warm,dry      Indwelling Urinary Catheter continued, requirement due to   Reason to continue Indwelling Urinary Catheter for strict Intake/Output monitoring for hemodynamic instability     ASSESSMENT AND PLAN SYNOPSIS   Severe Hypoxic and Hypercapnic Respiratory Failure from sCHF exacerbation -continue biPAP Wean fio2  CARDIAC FAILURE-suspect sCHF and dCHF Check ECHO -oxygen as needed -Lasix as tolerated -follow up cardiac enzymes as indicated    NEUROLOGY-lethargic but arousable Avoid heavy sedatives Given xanax   CARDIAC ICU monitoring   GI/Nutrition GI PROPHYLAXIS as indicated DIET--NPO for now Constipation protocol as indicated  ENDO - ICU hypoglycemic\Hyperglycemia protocol -check FSBS per protocol   ELECTROLYTES -follow labs as  needed -replace as needed -pharmacy consultation and following   DVT/GI PRX ordered TRANSFUSIONS AS NEEDED MONITOR FSBS ASSESS the need for LABS as needed   Critical Care Time devoted to patient care services described in this note is 32 minutes.   Overall, patient is critically ill, prognosis is guarded.  high risk for cardiac arrest and death. Patient is DNR/DNI   Corrin Parker, M.D.  [facility]  Medical Director [place] Director [facility] Cardio-Pulmonary Department

## 2018-06-10 NOTE — Progress Notes (Signed)
eLink Physician-Brief Progress Note Patient Name: Vicki Morales DOB: [birth year] MRN: [id number]   Date of Service  06/10/2018  HPI/Events of Note  Bladder scan 400, post lasix 40.   eICU Interventions  One time to do straight cath, follow scan in am.      Intervention Category Minor Interventions: Routine modifications to care plan (e.g. PRN medications for pain, fever)  Elmer Sow 06/10/2018, 1:33 AM

## 2018-06-10 NOTE — Clinical Social Work Note (Signed)
CSW received consult that patient is from [facility]. CSW will assess when able.  Vicki Morales, MSW, Latanya Presser [phone]

## 2018-06-11 LAB — GLUCOSE, CAPILLARY
Glucose-Capillary: 123 mg/dL — ABNORMAL HIGH (ref 70–99)
Glucose-Capillary: 132 mg/dL — ABNORMAL HIGH (ref 70–99)
Glucose-Capillary: 107 mg/dL — ABNORMAL HIGH (ref 70–99)
Glucose-Capillary: 163 mg/dL — ABNORMAL HIGH (ref 70–99)

## 2018-06-11 MED ORDER — LEVOFLOXACIN IN D5W 750 MG/150ML IV SOLN
750.0000 mg | INTRAVENOUS | Status: AC
  Administered 2018-06-11: 750 mg via INTRAVENOUS
  Filled 2018-06-11: qty 150

## 2018-06-11 MED ORDER — HYDROCOD POLST-CPM POLST ER 10-8 MG/5ML PO SUER
5.0000 mL | Freq: Two times a day (BID) | ORAL | Status: DC | PRN
  Administered 2018-06-11: 5 mL via ORAL
  Filled 2018-06-11: qty 5

## 2018-06-11 MED ORDER — METHYLPREDNISOLONE SODIUM SUCC 125 MG IJ SOLR
60.0000 mg | Freq: Four times a day (QID) | INTRAMUSCULAR | Status: DC
  Administered 2018-06-11 – 2018-06-12 (×5): 60 mg via INTRAVENOUS
  Filled 2018-06-11 (×6): qty 2

## 2018-06-11 MED ORDER — HYDROCOD POLST-CPM POLST ER 10-8 MG/5ML PO SUER: 5.0000 mL | Freq: Two times a day (BID) | ORAL | Status: DC

## 2018-06-11 MED ORDER — TRAZODONE HCL 50 MG PO TABS: 25.0000 mg | ORAL_TABLET | Freq: Every day | ORAL | Status: DC

## 2018-06-11 MED ORDER — ALBUTEROL SULFATE (2.5 MG/3ML) 0.083% IN NEBU
2.5000 mg | INHALATION_SOLUTION | Freq: Four times a day (QID) | RESPIRATORY_TRACT | Status: DC
  Administered 2018-06-11 (×2): 2.5 mg via RESPIRATORY_TRACT
  Filled 2018-06-11 (×2): qty 3

## 2018-06-11 MED ORDER — TRAZODONE HCL 50 MG PO TABS
25.0000 mg | ORAL_TABLET | Freq: Every evening | ORAL | Status: DC | PRN
  Administered 2018-06-11: 25 mg via ORAL
  Filled 2018-06-11: qty 1

## 2018-06-11 NOTE — Progress Notes (Signed)
[place] at [place] NAME: Vicki Morales    MR#:  [id number]  DATE OF BIRTH:  [birth year]  SUBJECTIVE:   She is doing fine this morning.  She denies any shortness of breath or chest pain.  She has no concerns.  REVIEW OF SYSTEMS:  Review of Systems  Constitutional: Negative for chills and fever.  HENT: Negative for congestion and sore throat.   Eyes: Negative for blurred vision and double vision.  Respiratory: Positive for cough. Negative for shortness of breath.   Cardiovascular: Negative for chest pain and leg swelling.  Gastrointestinal: Negative for nausea and vomiting.  Genitourinary: Negative for dysuria and urgency.  Musculoskeletal: Negative for back pain and neck pain.  Neurological: Negative for dizziness and headaches.  Psychiatric/Behavioral: Negative for depression. The patient is not nervous/anxious.     DRUG ALLERGIES:   Allergies  Allergen Reactions  . Arthrotec [Diclofenac-Misoprostol] Hives  . Codeine Other (See Comments)    Headache   . Diclofenac Other (See Comments) and Hives    Pt unsure allergy  . Hydrochlorothiazide Other (See Comments)    Weakness   . Penicillins Hives    Has patient had a PCN reaction causing immediate rash, facial/tongue/throat swelling, SOB or lightheadedness with hypotension: No Has patient had a PCN reaction causing severe rash involving mucus membranes or skin necrosis: No Has patient had a PCN reaction that required hospitalization: No Has patient had a PCN reaction occurring within the last 10 years: No If all of the above answers are "NO", then may proceed with Cephalosporin use.  . Sulfa Antibiotics Hives  . Tiotropium Bromide Monohydrate Other (See Comments)    Blurry vision   . Tylenol [Acetaminophen] Other (See Comments)    Doesn't work  . Morphine Hives and Rash  . Pantoprazole Rash   VITALS:  Blood pressure (!) 116/100, pulse 86, temperature (!) 97.4 F (36.3 C),  temperature source Axillary, resp. rate 16, height 5\' 5"  (1.651 m), weight 109.2 kg, SpO2 94 %. PHYSICAL EXAMINATION:  Physical Exam  GENERAL:  78 y.o.-year-old patient lying in the bed, in NAD EYES: Pupils equal, round, reactive to light and accommodation. No scleral icterus. Extraocular muscles intact.  HEENT: Head atraumatic, normocephalic. Oropharynx and nasopharynx clear.  NECK:  Supple, no jugular venous distention. No thyroid enlargement, no tenderness.  LUNGS:  Diminished breath sounds throughout all lung fields. nasal cannula in place.  Able to speak in full sentences.  Normal work of breathing. CARDIOVASCULAR: S1, S2 normal. No murmurs, rubs, or gallops.  ABDOMEN: Soft, nontender, nondistended. Bowel sounds present. No organomegaly or mass.  EXTREMITIES: +pedal edema, no cyanosis, or clubbing.  NEUROLOGIC: Arousable to loud verbal commands Moves all extremities PSYCHIATRIC: The patient is alert and oriented x 2 SKIN: No obvious rash, lesion, or ulcer.  LABORATORY PANEL:  Female CBC Recent Labs  Lab 06/10/18 0452  WBC 9.8  HGB 10.5*  HCT 34.3*  PLT 234   ------------------------------------------------------------------------------------------------------------------ Chemistries  Recent Labs  Lab 06/10/18 0452  NA 133*  K 3.9  CL 91*  CO2 35*  GLUCOSE 139*  BUN 18  CREATININE 0.82  CALCIUM 8.4*   RADIOLOGY:  No results found. ASSESSMENT AND PLAN:   Acute hypercapnic/hypoxemic respiratory failure- secondary to CHF and COPD exacerbation.  Initially requiring BiPAP, but has been transitioned to 4 L nasal cannula. -Wean O2 as able -Albuterol nebs as needed  Acute on chronic diastolic congestive heart failure exacerbation-improving -Continue IV Lasix -Monitor  electrolytes -Daily weights, strict I/O -ECHO  Acute COPD exacerbation-improving -Continue IV Solu-Medrol -Albuterol nebs as needed  Normocytic anemia-hemoglobin 10.5.  May be dilutional secondary to  volume overload state. -Monitor  Chronic atrial fibrillation-rate controlled -Continue Eliquis and amiodarone  All the records are reviewed and case discussed with Care Management/Social Worker. Management plans discussed with the patient, family and they are in agreement.  CODE STATUS: DNR  TOTAL TIME TAKING CARE OF THIS PATIENT: 35 minutes.   More than 50% of the time was spent in counseling/coordination of care: YES  POSSIBLE D/C IN 2-3 DAYS, DEPENDING ON CLINICAL CONDITION.   Vicki Morales  M.D on 06/11/2018 at 4:52 PM  Between 7am to 6pm - Pager - [phone]  After 6pm go to [email or web address] - Proofreader  [facility]  Office  [phone]  CC: Primary care physician; Birdie Sons, MD  Note: This dictation was prepared with Dragon dictation along with smaller phrase technology. Any transcriptional errors that result from this process are unintentional.

## 2018-06-11 NOTE — Clinical Social Work Note (Signed)
Clinical Social Work Assessment  Patient Details  Name: Vicki Morales MRN: [id number] Date of Birth: [birth year]  Date of referral:  06/11/18               Reason for consult:  Facility Placement                Permission sought to share information with:  Facility Sport and exercise psychologist, Family Supports Permission granted to share information::  Yes, Verbal Permission Granted  Name::     Charlisa Cham [phone]  Agency::  [facility]  Relationship::  yes  Contact Information:  yes  Housing/Transportation Living arrangements for the past 2 months:  Skilled Nursing Facility([facility] for STR) Source of Information:  Patient Patient Interpreter Needed:  None Criminal Activity/Legal Involvement Pertinent to Current Situation/Hospitalization:  No - Comment as needed Significant Relationships:  Adult Children, Friend Lives with:  Facility Resident(STR) Do you feel safe going back to the place where you live?  Yes Need for family participation in patient care:  Yes (Comment)  Care giving concerns:  They would like to see their mom well-good family support daughter HCPOA   Social Worker assessment / plan:  LCSW introduced myself to patient. She understood and is a willing participant to do assessment. Patient is from home but has been at [facility] doing STR and is improving well. It is her plan to return to [facility]. Patient is having some 02 issues in the evening. Patient needs assistance with some of her ADLs. PT consulted with patient and he reports she would benefit from SNF PT and OT. Patient does use a walker and after her breathing issues she felt weak. She reports she is feeling better but not sleeping well. Patient is a DNR. Completed assessment/ Fl2 started and Passr obtained.  Employment status:  Disabled (Comment on whether or not currently receiving Disability), Retired Forensic scientist:  Medicare, Managed Care, Medicaid In State([employer]/BCBS/medicaid) PT Recommendations:  [place] / Referral to community resources:   none requested  Patient/Family's Response to care:  Patient has a good understanding of her health issues  Patient/Family's Understanding of and Emotional Response to Diagnosis, Current Treatment, and Prognosis:  Patient was pleasant and had a good understanding of her treatment plan and reports feeling better.  Emotional Assessment Appearance:  Appears stated age Attitude/Demeanor/Rapport:  Gracious Affect (typically observed):  Pleasant Orientation:  Oriented to Self, Oriented to Place, Oriented to  Time, Oriented to Situation Alcohol / Substance use:  Not Applicable Psych involvement (Current and /or in the community):  Yes (Comment)(Dr Thomasena Edis- for anxiety and depression)  Discharge Needs  Concerns to be addressed:  No discharge needs identified Readmission within the last 30 days:  No Current discharge risk:  None Barriers to Discharge:  Continued Medical Work up   [place], LCSW 06/11/2018, 3:13 PM

## 2018-06-11 NOTE — Evaluation (Signed)
Physical Therapy Evaluation Patient Details Name: Vicki Morales MRN: [id number] DOB: 1940-08-24 Today's Date: 06/11/2018   History of Present Illness  Patient is a 78 y/o female that presents with respiratory distress from rehab facility. She has a PMH of COPD, chronic a-fib, GERD, HTN, orthopnea, anxiety, and depression.   Clinical Impression  Patient admitted for shortness of breath, is currently on supplemental O2. She has mild confusion throughout this session, but is ultimately able to follow all instructions appropriately. She has poor tolerance for mobility and demonstrates significant increase in HR and decrease in SpO2 with all mobility. She is typically able to perform household and short distance community mobility, however is limited to bed to chair ambulation this date given her cardiopulmonary status. She would likely benefit from SNF placement when medically appropriate for discharge to improve her functional mobility.     Follow Up Recommendations SNF    Equipment Recommendations  Rolling walker with 5" wheels    Recommendations for Other Services       Precautions / Restrictions Precautions Precautions: Fall Restrictions Weight Bearing Restrictions: No      Mobility  Bed Mobility Overal bed mobility: Needs Assistance Bed Mobility: Supine to Sit     Supine to sit: Mod assist     General bed mobility comments: Pt requires assistance with torso to complete transfer but is able to utilize her LEs.   Transfers Overall transfer level: Needs assistance Equipment used: Rolling walker (2 wheeled) Transfers: Sit to/from Stand Sit to Stand: Min assist;Mod assist         General transfer comment: Patient is able to transfer sit to stand with RW with min-mod A  Ambulation/Gait Ambulation/Gait assistance: Min assist Gait Distance (Feet): 3 Feet Assistive device: Rolling walker (2 wheeled) Gait Pattern/deviations: WFL(Within Functional Limits)   Gait velocity  interpretation: <1.31 ft/sec, indicative of household ambulator General Gait Details: Patient is able to turn with use of RW to complete bed to chair transfer.   Stairs            Wheelchair Mobility    Modified Rankin (Stroke Patients Only)       Balance Overall balance assessment: Needs assistance Sitting-balance support: Bilateral upper extremity supported Sitting balance-Leahy Scale: Good     Standing balance support: Bilateral upper extremity supported Standing balance-Leahy Scale: Fair                               Pertinent Vitals/Pain Pain Assessment: No/denies pain    Home Living Family/patient expects to be discharged to:: Skilled nursing facility                 Additional Comments: Pt is not a good historian.    Prior Function Level of Independence: Independent with assistive device(s)         Comments: Patient is not a good historian, though does endorse use of a RW.      Hand Dominance   Dominant Hand: Right    Extremity/Trunk Assessment   Upper Extremity Assessment Upper Extremity Assessment: Overall WFL for tasks assessed    Lower Extremity Assessment Lower Extremity Assessment: Overall WFL for tasks assessed       Communication   Communication: HOH  Cognition Arousal/Alertness: Awake/alert Behavior During Therapy: WFL for tasks assessed/performed Overall Cognitive Status: History of cognitive impairments - at baseline  General Comments      Exercises General Exercises - Lower Extremity Long Arc Quad: AROM;10 reps;Both Heel Slides: AAROM;15 reps;Both Straight Leg Raises: AAROM;15 reps;Both   Assessment/Plan    PT Assessment Patient needs continued PT services  PT Problem List Decreased strength;Decreased balance;Decreased knowledge of precautions;Decreased knowledge of use of DME;Decreased mobility;Cardiopulmonary status limiting activity;Decreased  activity tolerance       PT Treatment Interventions DME instruction;Functional mobility training;Balance training;Gait training;Therapeutic activities;Therapeutic exercise    PT Goals (Current goals can be found in the Care Plan section)  Acute Rehab PT Goals Patient Stated Goal: To return home safely  PT Goal Formulation: With patient Time For Goal Achievement: 06/25/18 Potential to Achieve Goals: Good    Frequency Min 2X/week   Barriers to discharge        Co-evaluation               AM-PAC PT "6 Clicks" Mobility  Outcome Measure Help needed turning from your back to your side while in a flat bed without using bedrails?: A Lot Help needed moving from lying on your back to sitting on the side of a flat bed without using bedrails?: A Lot Help needed moving to and from a bed to a chair (including a wheelchair)?: A Lot Help needed standing up from a chair using your arms (e.g., wheelchair or bedside chair)?: A Lot Help needed to walk in hospital room?: A Lot Help needed climbing 3-5 steps with a railing? : Total 6 Click Score: 11    End of Session Equipment Utilized During Treatment: Gait belt;Oxygen Activity Tolerance: Patient tolerated treatment well;Patient limited by fatigue Patient left: in chair;with call bell/phone within reach Nurse Communication: Mobility status PT Visit Diagnosis: Unsteadiness on feet (R26.81);Muscle weakness (generalized) (M62.81)    Time: 3235-5732 PT Time Calculation (min) (ACUTE ONLY): 28 min   Charges:   PT Evaluation $PT Eval Moderate Complexity: 1 Mod PT Treatments $Therapeutic Exercise: 8-22 mins        Royce Macadamia PT, DPT, CSCS   06/11/2018, 2:53 PM

## 2018-06-11 NOTE — NC FL2 (Signed)
[place] LEVEL OF CARE SCREENING TOOL     IDENTIFICATION  Patient Name: Vicki Morales Birthdate: May 04, 1941 Sex: female Admission Date (Current Location): 06/09/2018  [place] and [place] Number:  Engineering geologist and Address:  [facility], [address], [place], [place] [address]      Provider Number: [id number]  Attending Physician Name and Address:  Sela Hua, MD  Relative Name and Phone Number:       Current Level of Care: Hospital Recommended Level of Care: [place] Prior Approval Number:    Date Approved/Denied:   PASRR Number:    Discharge Plan: SNF    Current Diagnoses: Patient Active Problem List   Diagnosis Date Noted  . Acute respiratory failure ([place]) 06/09/2018  . Acute and chronic respiratory failure with hypercapnia ([place]) 03/05/2018  . COPD exacerbation ([place]) 02/19/2018  . Diastolic dysfunction 82/50/5397  . Sepsis ([place]) 12/03/2017  . Schizophrenia, undifferentiated ([place]) 11/24/2017  . COPD with acute exacerbation ([place]) 10/24/2017  . Nocturnal hypoxia 08/21/2016  . Restless leg syndrome 08/20/2016  . Impingement syndrome of shoulder region 07/29/2016  . Hyponatremia 03/21/2016  . Pneumonia 03/18/2016  . History of suicide attempt 03/16/2016  . Overdose 03/16/2016  . Pulmonary hypertension ([place]) 11/11/2015  . Anxiety 09/22/2015  . Depression 09/22/2015  . Osteoarthritis 09/10/2015  . Rosacea 07/03/2015  . Breast pain 11/07/2014  . Callus of foot 11/07/2014  . CN (constipation) 11/07/2014  . Dermatitis, eczematoid 11/07/2014  . Diverticulosis of colon 11/07/2014  . Dizziness 11/07/2014  . Can't get food down 11/07/2014  . Accumulation of fluid in tissues 11/07/2014  . Fatigue 11/07/2014  . FOM (frequency of micturition) 11/07/2014  . Tension type headache 11/07/2014  . LBP (low back pain) 11/07/2014  . Episodic mood disorder ([place]) 11/07/2014  . Extreme obesity 11/07/2014  .  Muscle ache 11/07/2014  . Disturbance of skin sensation 11/07/2014  . Awareness of heartbeats 11/07/2014  . Jerking 11/07/2014  . Body tinea 11/07/2014  . Essential (primary) hypertension 10/10/2014  . Moderate COPD (chronic obstructive pulmonary disease) ([place]) 04/01/2014  . Hx of obesity 04/01/2014  . CAFL (chronic airflow limitation) ([place]) 01/25/2009  . Malaise and fatigue 11/26/2008  . Aphasia 09/26/2008  . 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 GERD (gastroesophageal reflux disease) 06/07/1998  . HLD (hyperlipidemia) 06/07/1998  . OP (osteoporosis) 06/07/1998  . H/O total hysterectomy 03/08/1987  . Schizophrenia, in remission ([place]) 06/07/1978    Orientation RESPIRATION BLADDER Height & Weight     Self, Time, Situation, Place  O2 Incontinent Weight: 240 lb 11.9 oz (109.2 kg) Height:  5\' 5"  (165.1 cm)  BEHAVIORAL SYMPTOMS/MOOD NEUROLOGICAL BOWEL NUTRITION STATUS      Continent    AMBULATORY STATUS COMMUNICATION OF NEEDS Skin   Supervision Verbally Normal                       Personal Care Assistance Level of Assistance  Bathing, Feeding, Dressing, Total care Bathing Assistance: Limited assistance Feeding assistance: Independent Dressing Assistance: Limited assistance Total Care Assistance: Limited assistance   Functional Limitations Info  Sight, Hearing, Speech Sight Info: Impaired(catarac surgery they are sensitive) Hearing Info: Adequate Speech Info: Adequate    SPECIAL CARE FACTORS FREQUENCY  PT (By licensed PT), OT (By licensed OT)     PT Frequency: x5 OT Frequency: x5            Contractures  Additional Factors Info  Code Status, Allergies Code Status Info: DNR Allergies Info:  Arthrotec Diclofenac-misoprostol, Codeine, Diclofenac, Hydrochlorothiazide, Penicillins, Sulfa Antibiotics, Tiotropium Bromide Monohydrate, Tylenol Acetaminophen, Morphine, Pantoprazole           Current Medications (06/11/2018):  This is the  current hospital active medication list Current Facility-Administered Medications  Medication Dose Route Frequency Provider Last Rate Last Dose  . 0.9 %  sodium chloride infusion  250 mL Intravenous PRN Saundra Shelling, MD      . acidophilus (RISAQUAD) capsule 1 capsule  1 capsule Oral TID WC Saundra Shelling, MD   1 capsule at 06/11/18 1207  . albuterol (PROVENTIL) (2.5 MG/3ML) 0.083% nebulizer solution 2.5 mg  2.5 mg Nebulization Q6H Conforti, John, DO   2.5 mg at 06/11/18 1538  . ALPRAZolam Duanne Moron) tablet 0.5 mg  0.5 mg Oral QHS PRN Saundra Shelling, MD   0.5 mg at 06/09/18 2339  . amiodarone (PACERONE) tablet 200 mg  200 mg Oral BID Saundra Shelling, MD   200 mg at 06/11/18 0810  . apixaban (ELIQUIS) tablet 5 mg  5 mg Oral BID Saundra Shelling, MD   5 mg at 06/11/18 0810  . aspirin EC tablet 81 mg  81 mg Oral Daily Saundra Shelling, MD   81 mg at 06/11/18 0811  . busPIRone (BUSPAR) tablet 15 mg  15 mg Oral TID Saundra Shelling, MD   15 mg at 06/11/18 0811  . calcium-vitamin D (OSCAL WITH D) 500-200 MG-UNIT per tablet 1 tablet  1 tablet Oral BID Saundra Shelling, MD   1 tablet at 06/11/18 0809  . chlorhexidine (PERIDEX) 0.12 % solution 15 mL  15 mL Mouth Rinse BID Flora Lipps, MD   15 mL at 06/11/18 0809  . chlorpheniramine-HYDROcodone (TUSSIONEX) 10-8 MG/5ML suspension 5 mL  5 mL Oral Q12H PRN Awilda Bill, NP   5 mL at 06/11/18 0208  . digoxin (LANOXIN) tablet 0.0625 mg  0.0625 mg Oral Daily Pyreddy, Reatha Harps, MD   0.0625 mg at 06/11/18 6063  . divalproex (DEPAKOTE ER) 24 hr tablet 250 mg  250 mg Oral BID Saundra Shelling, MD   250 mg at 06/11/18 0160  . feeding supplement (ENSURE ENLIVE) (ENSURE ENLIVE) liquid 237 mL  237 mL Oral BID BM Pyreddy, Pavan, MD   237 mL at 06/10/18 1452  . furosemide (LASIX) injection 40 mg  40 mg Intravenous Q12H Saundra Shelling, MD   40 mg at 06/11/18 1206  . gabapentin (NEURONTIN) capsule 100 mg  100 mg Oral BID WC Pyreddy, Reatha Harps, MD   100 mg at 06/11/18 0809  . gabapentin  (NEURONTIN) tablet 600 mg  600 mg Oral QHS Pyreddy, Reatha Harps, MD   600 mg at 06/10/18 2249  . guaiFENesin (MUCINEX) 12 hr tablet 600 mg  600 mg Oral BID Saundra Shelling, MD   600 mg at 06/11/18 0811  . guaiFENesin-dextromethorphan (ROBITUSSIN DM) 100-10 MG/5ML syrup 5 mL  5 mL Oral Q4H PRN Pyreddy, Reatha Harps, MD   5 mL at 06/11/18 0420  . hydrALAZINE (APRESOLINE) injection 10-20 mg  10-20 mg Intravenous Q4H PRN Awilda Bill, NP   10 mg at 06/11/18 0053  . hydrALAZINE (APRESOLINE) tablet 25 mg  25 mg Oral BID Awilda Bill, NP   25 mg at 06/11/18 0810  . insulin aspart (novoLOG) injection 0-15 Units  0-15 Units Subcutaneous TID WC Saundra Shelling, MD   2 Units at 06/11/18 1207  . insulin aspart (novoLOG) injection 0-5 Units  0-5 Units Subcutaneous QHS  Saundra Shelling, MD      . levofloxacin [facility]) IVPB 750 mg  750 mg Intravenous Q24H Charlett Nose, RPH 100 mL/hr at 06/10/18 2303 750 mg at 06/10/18 2303  . loperamide (IMODIUM) capsule 2 mg  2 mg Oral PRN Pyreddy, Reatha Harps, MD      . magnesium oxide (MAG-OX) tablet 400 mg  400 mg Oral BID Saundra Shelling, MD   400 mg at 06/11/18 0811  . meclizine (ANTIVERT) tablet 12.5 mg  12.5 mg Oral BID PRN Saundra Shelling, MD      . MEDLINE mouth rinse  15 mL Mouth Rinse q12n4p Flora Lipps, MD   15 mL at 06/11/18 1207  . Melatonin TABS 10 mg  10 mg Oral QHS Saundra Shelling, MD   10 mg at 06/10/18 2250  . methylPREDNISolone sodium succinate (SOLU-MEDROL) 125 mg/2 mL injection 60 mg  60 mg Intravenous Q6H Conforti, John, DO   60 mg at 06/11/18 1206  . metoprolol tartrate (LOPRESSOR) injection 5 mg  5 mg Intravenous Q6H PRN Conforti, John, DO   5 mg at 06/10/18 1754  . multivitamin with minerals tablet 1 tablet  1 tablet Oral Daily Saundra Shelling, MD   1 tablet at 06/11/18 0811  . omega-3 acid ethyl esters (LOVAZA) capsule 1 g  1 g Oral Daily Pyreddy, Reatha Harps, MD   1 g at 06/11/18 0811  . ondansetron (ZOFRAN) tablet 4 mg  4 mg Oral Q6H PRN Saundra Shelling, MD       Or   . ondansetron (ZOFRAN) injection 4 mg  4 mg Intravenous Q6H PRN Pyreddy, Reatha Harps, MD      . oxybutynin (DITROPAN-XL) 24 hr tablet 10 mg  10 mg Oral QHS Pyreddy, Reatha Harps, MD   10 mg at 06/10/18 2249  . protein supplement (PREMIER PROTEIN) liquid - approved for s/p bariatric surgery  11 oz Oral BID BM Saundra Shelling, MD   11 oz at 06/11/18 0814  . risperiDONE (RISPERDAL) tablet 1 mg  1 mg Oral QHS Pyreddy, Reatha Harps, MD   1 mg at 06/10/18 2251  . senna-docusate (Senokot-S) tablet 1 tablet  1 tablet Oral QHS PRN Pyreddy, Reatha Harps, MD      . sodium chloride flush (NS) 0.9 % injection 3 mL  3 mL Intravenous Q12H Pyreddy, Reatha Harps, MD   3 mL at 06/11/18 0832  . sodium chloride flush (NS) 0.9 % injection 3 mL  3 mL Intravenous PRN Pyreddy, Reatha Harps, MD      . traZODone (DESYREL) tablet 25 mg  25 mg Oral QHS PRN Awilda Bill, NP   25 mg at 06/11/18 0235  . vitamin B-12 (CYANOCOBALAMIN) tablet 1,000 mcg  1,000 mcg Oral Daily Pyreddy, Reatha Harps, MD   1,000 mcg at 06/11/18 0809  . vitamin C (ASCORBIC ACID) tablet 250 mg  250 mg Oral BID Saundra Shelling, MD   250 mg at 06/11/18 7741     Discharge Medications: Please see discharge summary for a list of discharge medications.  Relevant Imaging Results:  Relevant Lab Results:   Additional Information SSN: [id number]  Joana Reamer, [place]

## 2018-06-11 NOTE — Progress Notes (Signed)
Report given to [facility] from 1C at patient's bedside.  Pt transferred to 1C bed with O2 tank, no personal belongings, chart and meds with patient.  Megan and 1C NT transported patient to 1C

## 2018-06-11 NOTE — Progress Notes (Addendum)
Follow up - Critical Care Medicine Note  Patient Details:    Vicki Morales is an 78 y.o. female.  With past medical history remarkable for hypertension, depression/anxiety, COPD, restless leg syndrome, congestive heart failure presented to the emergency department in respiratory distress.  She received Solu-Medrol and nebulizer therapy, was lethargic and was subsequently started on BiPAP secondary to hypercapnic failure.  She was transferred to the intensive care unit.  Lines, Airways, Drains: External Urinary Catheter (Active)  Collection Container Dedicated Suction Canister 06/11/2018  4:24 AM  Date Prophylactic Dressing Applied (if applicable) 16/10/96 0/09/5407  8:00 AM  Intervention Equipment Changed 06/10/2018  4:00 PM  Output (mL) 150 mL 06/11/2018  8:00 AM    Anti-infectives:  Anti-infectives (From admission, onward)   Start     Dose/Rate Route Frequency Ordered Stop   06/10/18 2200  levofloxacin (LEVAQUIN) IVPB 750 mg     750 mg 100 mL/hr over 90 Minutes Intravenous Every 24 hours 06/10/18 1402 06/12/18 2159   06/09/18 2115  levofloxacin (LEVAQUIN) IVPB 750 mg  Status:  Discontinued     750 mg 100 mL/hr over 90 Minutes Intravenous Every 24 hours 06/09/18 2100 06/10/18 1402      Microbiology: Results for orders placed or performed during the hospital encounter of 06/09/18  MRSA PCR Screening     Status: None   Collection Time: 06/09/18  8:43 PM  Result Value Ref Range Status   MRSA by PCR NEGATIVE NEGATIVE Final    Comment:        The GeneXpert MRSA Assay (FDA approved for NASAL specimens only), is one component of a comprehensive MRSA colonization surveillance program. It is not intended to diagnose MRSA infection nor to guide or monitor treatment for MRSA infections. Performed at [facility], [address] [place], [place] [address]   CULTURE, BLOOD (ROUTINE X 2) w Reflex to ID Panel     Status: None (Preliminary result)   Collection Time: 06/09/18  9:34  PM  Result Value Ref Range Status   Specimen Description BLOOD RIGHT [facility]  Final   Special Requests   Final    BOTTLES DRAWN AEROBIC AND ANAEROBIC Blood Culture adequate volume   Culture   Final    NO GROWTH 2 DAYS Performed at [facility], [address], [place], [place] [address]    Report Status PENDING  Incomplete  CULTURE, BLOOD (ROUTINE X 2) w Reflex to ID Panel     Status: None (Preliminary result)   Collection Time: 06/09/18  9:42 PM  Result Value Ref Range Status   Specimen Description BLOOD LEFT HAND  Final   Special Requests   Final    BOTTLES DRAWN AEROBIC AND ANAEROBIC Blood Culture adequate volume   Culture   Final    NO GROWTH 2 DAYS Performed at [facility], [address], [place], [place] [address]    Report Status PENDING  Incomplete   Studies: Dg Chest Port 1 View  Result Date: 06/09/2018 CLINICAL DATA:  Shortness of breath. EXAM: PORTABLE CHEST 1 VIEW COMPARISON:  03/13/2018. FINDINGS: Cardiomegaly with pulmonary venous congestion bilateral interstitial prominence. No pleural effusion. No pneumothorax. No acute bony abnormality. IMPRESSION: Cardiomegaly with pulmonary venous congestion and mild bilateral from interstitial prominence suggesting CHF. Mild pneumonitis can not be excluded. A component of underlying chronic interstitial lung disease may also be present. Electronically Signed   By: Marcello Moores  Register   On: 06/09/2018 10:36     Subjective:  Overnight Issues: Patient is doing better this morning.  Is off of BiPAP, awake, alert and communicating still some confusion present  Objective:  Vital signs for last 24 hours: Temp:  [97.4 F (36.3 C)-97.8 F (36.6 C)] 97.4 F (36.3 C) (01/05 0800) Pulse Rate:  [64-130] 84 (01/05 0800) Resp:  [14-33] 20 (01/05 0800) BP: (128-179)/(57-150) 144/69 (01/05 0800) SpO2:  [89 %-100 %] 97 % (01/05 0800)  Hemodynamic parameters for last 24 hours:    Intake/Output from previous day: 01/04  0701 - 01/05 0700 In: 1165 [P.O.:1074; IV Piggyback:91] Out: 2425 [Urine:2425]  Intake/Output this shift: Total I/O In: -  Out: 150 [Urine:150]  Vent settings for last 24 hours:    Physical Exam:  GENERAL:critically ill appearing, no resp distress HEAD: Normocephalic, atraumatic.  EYES: Pupils equal, round, reactive to light.  No scleral icterus.  MOUTH: Moist mucosal membrane. NECK: Supple. No thyromegaly. No nodules. No JVD.  PULMONARY: +rhonchi, +wheezing  CARDIOVASCULAR: S1 and S2. Regular rate and rhythm. No murmurs, rubs, or gallops.  GASTROINTESTINAL: Soft, nontender, -distended. No masses. Positive bowel sounds. No hepatosplenomegaly.  MUSCULOSKELETAL: No swelling, clubbing, or edema.    Assessment/Plan:   Hypercapnic respiratory failure.  Multifactorial etiology to include COPD with component of chronic diastolic heart failure.  She is still wheezing on exam today.  We will continue albuterol,add Solu-Medrol, patient has reaction to anticholinergic therapy so we will hold, complete 5-day course of Levaquin  Chronic diastolic heart failure.  Is on IV Lasix.  -1.2 L diuresis.  Atrial fibrillation.  Chronic on amiodarone and apixaban  Hyponatremia  Metabolic alkalosis.  Multifactorial to include compensatory for hypercapnic failure and fluoride responsive metabolic alkalosis from diuresis  Anemia.  No evidence of active bleeding  Hermelinda Dellen, DO  06/11/2018  *Care during the described time interval was provided by me and/or other providers on the critical care team.  I have reviewed this patient's available data, including medical history, events of note, physical examination and test results as part of my evaluation.

## 2018-06-12 LAB — GLUCOSE, CAPILLARY
Glucose-Capillary: 122 mg/dL — ABNORMAL HIGH (ref 70–99)
Glucose-Capillary: 163 mg/dL — ABNORMAL HIGH (ref 70–99)
Glucose-Capillary: 119 mg/dL — ABNORMAL HIGH (ref 70–99)
Glucose-Capillary: 145 mg/dL — ABNORMAL HIGH (ref 70–99)

## 2018-06-12 LAB — BASIC METABOLIC PANEL
Anion gap: 8 (ref 5–15)
BUN: 48 mg/dL — ABNORMAL HIGH (ref 8–23)
CO2: 37 mmol/L — ABNORMAL HIGH (ref 22–32)
Calcium: 8.9 mg/dL (ref 8.9–10.3)
Chloride: 83 mmol/L — ABNORMAL LOW (ref 98–111)
Creatinine, Ser: 1.13 mg/dL — ABNORMAL HIGH (ref 0.44–1.00)
GFR, EST AFRICAN AMERICAN: 54 mL/min — AB (ref >60)
GFR, EST NON AFRICAN AMERICAN: 46 mL/min — AB (ref >60)
Glucose, Bld: 134 mg/dL — ABNORMAL HIGH (ref 70–99)
Potassium: 4.6 mmol/L (ref 3.5–5.1)
Sodium: 128 mmol/L — ABNORMAL LOW (ref 135–145)

## 2018-06-12 LAB — CBC
HCT: 33.8 % — ABNORMAL LOW (ref 36.0–46.0)
Hemoglobin: 10.5 g/dL — ABNORMAL LOW (ref 12.0–15.0)
MCH: 29.3 pg (ref 26.0–34.0)
MCHC: 31.1 g/dL (ref 30.0–36.0)
MCV: 94.4 fL (ref 80.0–100.0)
Platelets: 288 10*3/uL (ref 150–400)
RBC: 3.58 MIL/uL — ABNORMAL LOW (ref 3.87–5.11)
RDW: 14.4 % (ref 11.5–15.5)
WBC: 6 10*3/uL (ref 4.0–10.5)
nRBC: 0 % (ref 0.0–0.2)

## 2018-06-12 MED ORDER — POLYETHYLENE GLYCOL 3350 17 G PO PACK
17.0000 g | PACK | Freq: Every day | ORAL | Status: DC
  Administered 2018-06-12 – 2018-06-13 (×2): 17 g via ORAL
  Filled 2018-06-12 (×2): qty 1

## 2018-06-12 MED ORDER — ALBUTEROL SULFATE (2.5 MG/3ML) 0.083% IN NEBU
2.5000 mg | INHALATION_SOLUTION | Freq: Three times a day (TID) | RESPIRATORY_TRACT | Status: DC
  Administered 2018-06-12 – 2018-06-13 (×6): 2.5 mg via RESPIRATORY_TRACT
  Filled 2018-06-12 (×6): qty 3

## 2018-06-12 MED ORDER — ALBUTEROL SULFATE (2.5 MG/3ML) 0.083% IN NEBU
2.5000 mg | INHALATION_SOLUTION | Freq: Four times a day (QID) | RESPIRATORY_TRACT | Status: DC | PRN
  Administered 2018-06-12: 2.5 mg via RESPIRATORY_TRACT
  Filled 2018-06-12: qty 3

## 2018-06-12 MED ORDER — METHYLPREDNISOLONE SODIUM SUCC 125 MG IJ SOLR
60.0000 mg | Freq: Two times a day (BID) | INTRAMUSCULAR | Status: DC
  Administered 2018-06-12 – 2018-06-13 (×2): 60 mg via INTRAVENOUS
  Filled 2018-06-12 (×2): qty 2

## 2018-06-12 NOTE — Plan of Care (Signed)
  Problem: Health Behavior/Discharge Planning: Goal: Ability to manage health-related needs will improve Outcome: Progressing   Problem: Clinical Measurements: Goal: Ability to maintain clinical measurements within normal limits will improve Outcome: Progressing Goal: Will remain free from infection Outcome: Progressing Goal: Diagnostic test results will improve Outcome: Progressing Goal: Respiratory complications will improve Outcome: Progressing Goal: Cardiovascular complication will be avoided Outcome: Progressing   Problem: Activity: Goal: Risk for activity intolerance will decrease Outcome: Progressing   Problem: Elimination: Goal: Will not experience complications related to bowel motility Outcome: Progressing Goal: Will not experience complications related to urinary retention Outcome: Progressing   Problem: Skin Integrity: Goal: Risk for impaired skin integrity will decrease Outcome: Progressing   

## 2018-06-12 NOTE — Progress Notes (Signed)
[place] at [place] NAME: Vicki Morales    MR#:  [id number]  DATE OF BIRTH:  04/02/1941  SUBJECTIVE:   Patient states that her shortness of breath is doing fine today.  She notes that she is very exhausted after getting up with physical therapy.  She denies any chest pain.  Feels like her lower extremity edema is improving.  REVIEW OF SYSTEMS:  Review of Systems  Constitutional: Positive for malaise/fatigue. Negative for chills and fever.  HENT: Negative for congestion and sore throat.   Eyes: Negative for blurred vision and double vision.  Respiratory: Positive for cough. Negative for shortness of breath.   Cardiovascular: Negative for chest pain and leg swelling.  Gastrointestinal: Negative for nausea and vomiting.  Genitourinary: Negative for dysuria and urgency.  Musculoskeletal: Negative for back pain and neck pain.  Neurological: Negative for dizziness and headaches.  Psychiatric/Behavioral: Negative for depression. The patient is not nervous/anxious.     DRUG ALLERGIES:   Allergies  Allergen Reactions  . Arthrotec [Diclofenac-Misoprostol] Hives  . Codeine Other (See Comments)    Headache   . Diclofenac Other (See Comments) and Hives    Pt unsure allergy  . Hydrochlorothiazide Other (See Comments)    Weakness   . Penicillins Hives    Has patient had a PCN reaction causing immediate rash, facial/tongue/throat swelling, SOB or lightheadedness with hypotension: No Has patient had a PCN reaction causing severe rash involving mucus membranes or skin necrosis: No Has patient had a PCN reaction that required hospitalization: No Has patient had a PCN reaction occurring within the last 10 years: No If all of the above answers are "NO", then may proceed with Cephalosporin use.  . Sulfa Antibiotics Hives  . Tiotropium Bromide Monohydrate Other (See Comments)    Blurry vision   . Tylenol [Acetaminophen] Other (See Comments)   Doesn't work  . Morphine Hives and Rash  . Pantoprazole Rash   VITALS:  Blood pressure (!) 154/60, pulse 70, temperature 98.4 F (36.9 C), temperature source Oral, resp. rate 20, height 5\' 5"  (1.651 m), weight 109.2 kg, SpO2 94 %. PHYSICAL EXAMINATION:  Physical Exam  GENERAL:  78 y.o.-year-old patient sitting up in chair, in NAD EYES: Pupils equal, round, reactive to light and accommodation. No scleral icterus. Extraocular muscles intact.  HEENT: Head atraumatic, normocephalic. Oropharynx and nasopharynx clear.  NECK:  Supple, no jugular venous distention. No thyroid enlargement, no tenderness.  LUNGS:  Diminished breath sounds throughout all lung fields. nasal cannula in place.  Able to speak in full sentences.  Normal work of breathing. CARDIOVASCULAR: S1, S2 normal. No murmurs, rubs, or gallops.  ABDOMEN: Soft, nontender, nondistended. Bowel sounds present. No organomegaly or mass.  EXTREMITIES: + Nonpitting bilateral lower extremity edema, no cyanosis, or clubbing.  NEUROLOGIC:  Cranial nerves II through XII grossly intact, no focal deficits, +global weakness, sensation intact PSYCHIATRIC: The patient is alert and oriented x 2 SKIN: No obvious rash, lesion, or ulcer.  LABORATORY PANEL:  Female CBC Recent Labs  Lab 06/12/18 0302  WBC 6.0  HGB 10.5*  HCT 33.8*  PLT 288   ------------------------------------------------------------------------------------------------------------------ Chemistries  Recent Labs  Lab 06/12/18 0302  NA 128*  K 4.6  CL 83*  CO2 37*  GLUCOSE 134*  BUN 48*  CREATININE 1.13*  CALCIUM 8.9   RADIOLOGY:  No results found. ASSESSMENT AND PLAN:   Acute hypercapnic/hypoxemic respiratory failure- secondary to CHF and COPD exacerbation.  Remains on 4  L O2 by nasal cannula this morning. -Wean O2 as able -Albuterol nebs as needed  Acute on chronic diastolic congestive heart failure exacerbation-improving -Continue IV Lasix -Monitor  electrolytes -Daily weights, strict I/O -ECHO ordered  Acute COPD exacerbation-improving -Taper IV solumedrol -Albuterol nebs as needed  Normocytic anemia-hemoglobin 10.5.  May be dilutional secondary to volume overload state. -Monitor  Chronic atrial fibrillation- rate controlled -Continue Eliquis and amiodarone  All the records are reviewed and case discussed with Care Management/Social Worker. Management plans discussed with the patient, family and they are in agreement.  CODE STATUS: DNR  TOTAL TIME TAKING CARE OF THIS PATIENT: 35 minutes.   More than 50% of the time was spent in counseling/coordination of care: YES  POSSIBLE D/C IN 2-3 DAYS, DEPENDING ON CLINICAL CONDITION.   [name]  M.D on 06/12/2018 at 2:07 PM  Between 7am to 6pm - Pager [phone]  After 6pm go to [email or web address] - Proofreader  [facility]  Office  [phone]  CC: Primary care physician; Birdie Sons, MD  Note: This dictation was prepared with Dragon dictation along with smaller phrase technology. Any transcriptional errors that result from this process are unintentional.

## 2018-06-12 NOTE — Progress Notes (Signed)
Physical Therapy Treatment Patient Details Name: Vicki Morales MRN: [id number] DOB: 1940-08-16 Today's Date: 06/12/2018    History of Present Illness Patient is a 78 y/o female that presents with respiratory distress from rehab facility. She has a PMH of COPD, chronic a-fib, GERD, HTN, orthopnea, anxiety, and depression.     PT Comments    Patient in bed, alert, agreeable to PT on 4L via [place] spO2 >92%. Throughout session pt on O2 and spO2 >93%. Pt able to perform bed mobility with improved independence this session, supervision and use of bed rails. Able to sit EOB for ~25min with mild SO, vitals stable. Sit<>stand with RW and CGA, able to take a few shuffling steps to chair. Pt fatigued end of transfer, nursing in room to administer medication. The patient would benefit from further skilled PT to continue to progress towards goals to maximize safety, mobility, and independence.    Follow Up Recommendations  SNF     Equipment Recommendations  Rolling walker with 5" wheels    Recommendations for Other Services       Precautions / Restrictions Precautions Precautions: Fall Restrictions Weight Bearing Restrictions: No    Mobility  Bed Mobility Overal bed mobility: Needs Assistance Bed Mobility: Supine to Sit     Supine to sit: Supervision     General bed mobility comments: use of bed rails, minimal verbal cues for sequencing of movements. able to sit EOB with supervision and UE support.  Transfers Overall transfer level: Needs assistance Equipment used: Rolling walker (2 wheeled) Transfers: Sit to/from Stand Sit to Stand: Min guard            Ambulation/Gait Ambulation/Gait assistance: Min guard Gait Distance (Feet): 3 Feet Assistive device: Rolling walker (2 wheeled)       General Gait Details: Shuffling gait, able to take small steps to chair at bedside.    Stairs             Wheelchair Mobility    Modified Rankin (Stroke Patients Only)        Balance Overall balance assessment: Needs assistance Sitting-balance support: Bilateral upper extremity supported Sitting balance-Leahy Scale: Good     Standing balance support: Bilateral upper extremity supported Standing balance-Leahy Scale: Poor                              Cognition Arousal/Alertness: Awake/alert Behavior During Therapy: WFL for tasks assessed/performed Overall Cognitive Status: History of cognitive impairments - at baseline                                        Exercises      General Comments        Pertinent Vitals/Pain Pain Assessment: No/denies pain    Home Living                      Prior Function            PT Goals (current goals can now be found in the care plan section) Progress towards PT goals: Progressing toward goals    Frequency    Min 2X/week      PT Plan Current plan remains appropriate    Co-evaluation              AM-PAC PT "6 Clicks" Mobility   Outcome Measure  Help  needed turning from your back to your side while in a flat bed without using bedrails?: A Little Help needed moving from lying on your back to sitting on the side of a flat bed without using bedrails?: A Little Help needed moving to and from a bed to a chair (including a wheelchair)?: A Little Help needed standing up from a chair using your arms (e.g., wheelchair or bedside chair)?: A Little Help needed to walk in hospital room?: A Lot Help needed climbing 3-5 steps with a railing? : Total 6 Click Score: 15    End of Session Equipment Utilized During Treatment: Gait belt;Oxygen(4L) Activity Tolerance: Patient tolerated treatment well;Patient limited by fatigue Patient left: in chair;with call bell/phone within reach;with chair alarm set;with nursing/sitter in room Nurse Communication: Mobility status PT Visit Diagnosis: Unsteadiness on feet (R26.81);Muscle weakness (generalized) (M62.81)     Time:  3552-1747 PT Time Calculation (min) (ACUTE ONLY): 14 min  Charges:  $Therapeutic Activity: 8-22 mins                     Lieutenant Diego PT, DPT 12:45 PM,06/12/18 [phone]

## 2018-06-12 NOTE — Care Management Important Message (Signed)
Important Message  Patient Details  Name: Vicki Morales MRN: [id number] Date of Birth: 1941-05-08   Medicare Important Message Given:  Yes    [name] [name] 06/12/2018, 10:58 AM

## 2018-06-13 ENCOUNTER — Inpatient Hospital Stay (HOSPITAL_COMMUNITY)
Admit: 2018-06-13 | Discharge: 2018-06-13 | Disposition: A | Discharge status: Home / Self Care | Payer: Medicare HMO | Attending: Internal Medicine | Admitting: Internal Medicine

## 2018-06-13 DIAGNOSIS — J9601 Acute respiratory failure with hypoxia: Secondary | ICD-10-CM

## 2018-06-13 DIAGNOSIS — J9602 Acute respiratory failure with hypercapnia: Secondary | ICD-10-CM

## 2018-06-13 LAB — CBC
HCT: 36 % (ref 36.0–46.0)
Hemoglobin: 11.4 g/dL — ABNORMAL LOW (ref 12.0–15.0)
MCH: 28.9 pg (ref 26.0–34.0)
MCHC: 31.7 g/dL (ref 30.0–36.0)
MCV: 91.1 fL (ref 80.0–100.0)
Platelets: 284 10*3/uL (ref 150–400)
RBC: 3.95 MIL/uL (ref 3.87–5.11)
RDW: 14.1 % (ref 11.5–15.5)
WBC: 5.4 10*3/uL (ref 4.0–10.5)
nRBC: 0 % (ref 0.0–0.2)

## 2018-06-13 LAB — GLUCOSE, CAPILLARY
Glucose-Capillary: 132 mg/dL — ABNORMAL HIGH (ref 70–99)
Glucose-Capillary: 112 mg/dL — ABNORMAL HIGH (ref 70–99)
Glucose-Capillary: 131 mg/dL — ABNORMAL HIGH (ref 70–99)
Glucose-Capillary: 203 mg/dL — ABNORMAL HIGH (ref 70–99)
Glucose-Capillary: 121 mg/dL — ABNORMAL HIGH (ref 70–99)

## 2018-06-13 LAB — BASIC METABOLIC PANEL
Anion gap: 10 (ref 5–15)
BUN: 72 mg/dL — ABNORMAL HIGH (ref 8–23)
CALCIUM: 8.9 mg/dL (ref 8.9–10.3)
CO2: 35 mmol/L — ABNORMAL HIGH (ref 22–32)
Chloride: 81 mmol/L — ABNORMAL LOW (ref 98–111)
Creatinine, Ser: 1.42 mg/dL — ABNORMAL HIGH (ref 0.44–1.00)
GFR calc Af Amer: 41 mL/min — ABNORMAL LOW (ref >60)
GFR, EST NON AFRICAN AMERICAN: 35 mL/min — AB (ref >60)
Glucose, Bld: 134 mg/dL — ABNORMAL HIGH (ref 70–99)
Potassium: 4.4 mmol/L (ref 3.5–5.1)
SODIUM: 126 mmol/L — AB (ref 135–145)

## 2018-06-13 LAB — ECHOCARDIOGRAM COMPLETE
Height: 65 in
Weight: 3851.88 oz

## 2018-06-13 MED ORDER — FUROSEMIDE 40 MG PO TABS
40.0000 mg | ORAL_TABLET | Freq: Every day | ORAL | Status: DC
  Administered 2018-06-13 – 2018-06-14 (×2): 40 mg via ORAL
  Filled 2018-06-13 (×2): qty 1

## 2018-06-13 MED ORDER — PREDNISONE 50 MG PO TABS
50.0000 mg | ORAL_TABLET | Freq: Every day | ORAL | Status: AC
  Administered 2018-06-14 – 2018-06-18 (×5): 50 mg via ORAL
  Filled 2018-06-13 (×5): qty 1

## 2018-06-13 MED ORDER — BENZONATATE 100 MG PO CAPS: 200.0000 mg | ORAL_CAPSULE | Freq: Three times a day (TID) | ORAL | Status: DC | PRN

## 2018-06-13 MED ORDER — POLYETHYLENE GLYCOL 3350 17 G PO PACK
17.0000 g | PACK | Freq: Two times a day (BID) | ORAL | Status: DC
  Administered 2018-06-13 – 2018-06-22 (×7): 17 g via ORAL
  Filled 2018-06-13 (×15): qty 1

## 2018-06-13 MED ORDER — METHYLPREDNISOLONE SODIUM SUCC 125 MG IJ SOLR
60.0000 mg | Freq: Two times a day (BID) | INTRAMUSCULAR | Status: AC
  Administered 2018-06-13: 22:00:00 60 mg via INTRAVENOUS
  Filled 2018-06-13 (×2): qty 2

## 2018-06-13 MED ORDER — MENTHOL 3 MG MT LOZG
1.0000 | LOZENGE | OROMUCOSAL | Status: DC | PRN
  Filled 2018-06-13: qty 9

## 2018-06-13 NOTE — Progress Notes (Signed)
[place] at [place] NAME: Vicki Morales    MR#:  [id number]  DATE OF BIRTH:  [birth year]  SUBJECTIVE:   Still having some shortness of breath with movement this morning.  She endorses cough.  No fevers or chills.  No chest pain.  REVIEW OF SYSTEMS:  Review of Systems  Constitutional: Positive for malaise/fatigue. Negative for chills and fever.  HENT: Negative for congestion and sore throat.   Eyes: Negative for blurred vision and double vision.  Respiratory: Positive for cough and shortness of breath.   Cardiovascular: Negative for chest pain and leg swelling.  Gastrointestinal: Negative for nausea and vomiting.  Genitourinary: Negative for dysuria and urgency.  Musculoskeletal: Negative for back pain and neck pain.  Neurological: Negative for dizziness and headaches.  Psychiatric/Behavioral: Negative for depression. The patient is not nervous/anxious.     DRUG ALLERGIES:   Allergies  Allergen Reactions  . Arthrotec [Diclofenac-Misoprostol] Hives  . Codeine Other (See Comments)    Headache   . Diclofenac Other (See Comments) and Hives    Pt unsure allergy  . Hydrochlorothiazide Other (See Comments)    Weakness   . Penicillins Hives    Has patient had a PCN reaction causing immediate rash, facial/tongue/throat swelling, SOB or lightheadedness with hypotension: No Has patient had a PCN reaction causing severe rash involving mucus membranes or skin necrosis: No Has patient had a PCN reaction that required hospitalization: No Has patient had a PCN reaction occurring within the last 10 years: No If all of the above answers are "NO", then may proceed with Cephalosporin use.  . Sulfa Antibiotics Hives  . Tiotropium Bromide Monohydrate Other (See Comments)    Blurry vision   . Tylenol [Acetaminophen] Other (See Comments)    Doesn't work  . Morphine Hives and Rash  . Pantoprazole Rash   VITALS:  Blood pressure (!) 145/71, pulse 71,  temperature (!) 97.5 F (36.4 C), temperature source Oral, resp. rate 20, height 5\' 5"  (1.651 m), weight 109.2 kg, SpO2 97 %. PHYSICAL EXAMINATION:  Physical Exam  GENERAL:  78 y.o.-year-old patient sitting up in chair, in NAD EYES: Pupils equal, round, reactive to light and accommodation. No scleral icterus. Extraocular muscles intact.  HEENT: Head atraumatic, normocephalic. Oropharynx and nasopharynx clear.  NECK:  Supple, no jugular venous distention. No thyroid enlargement, no tenderness.  LUNGS:  + Decreased air movement and wheezing throughout all lung fields.  Crackles.  Nasal cannula in place.  Able to speak in full sentences.  Normal work of breathing. CARDIOVASCULAR: S1, S2 normal. No murmurs, rubs, or gallops.  ABDOMEN: Soft, nontender, nondistended. Bowel sounds present. No organomegaly or mass.  EXTREMITIES: + Nonpitting bilateral lower extremity edema, no cyanosis, or clubbing.  NEUROLOGIC:  Cranial nerves II through XII grossly intact, no focal deficits, +global weakness, sensation intact PSYCHIATRIC: The patient is alert and oriented x 2 SKIN: No obvious rash, lesion, or ulcer.  LABORATORY PANEL:  Female CBC Recent Labs  Lab 06/13/18 0518  WBC 5.4  HGB 11.4*  HCT 36.0  PLT 284   ------------------------------------------------------------------------------------------------------------------ Chemistries  Recent Labs  Lab 06/13/18 0518  NA 126*  K 4.4  CL 81*  CO2 35*  GLUCOSE 134*  BUN 72*  CREATININE 1.42*  CALCIUM 8.9   RADIOLOGY:  No results found. ASSESSMENT AND PLAN:   Acute hypercapnic/hypoxemic respiratory failure- secondary to CHF and COPD exacerbation.  Weaned to 2L O2 by nasal cannula this morning. -Wean O2 as  able -Albuterol nebs as needed  Acute on chronic diastolic congestive heart failure exacerbation-improving -Transition to po Lasix today -Monitor electrolytes -Daily weights, strict I/O -ECHO performed, but read is pending.  Acute  COPD exacerbation-improving -Transition from IV Solu-Medrol to prednisone tomorrow for a total 5-day course -Albuterol nebs as needed  AKI-creatinine bumped to 1.42 today.  Baseline 0.6-0.8. -Change from IV Lasix to home po Lasix -Recheck in the morning  Normocytic anemia-hemoglobin 10.5.  May be dilutional secondary to volume overload state. -Monitor  Chronic atrial fibrillation- rate controlled -Continue Eliquis and amiodarone  She can likely be discharged to SNF tomorrow.  All the records are reviewed and case discussed with Care Management/Social Worker. Management plans discussed with the patient, family and they are in agreement.  CODE STATUS: DNR  TOTAL TIME TAKING CARE OF THIS PATIENT: 35 minutes.   More than 50% of the time was spent in counseling/coordination of care: YES  POSSIBLE D/C tomorrow, DEPENDING ON CLINICAL CONDITION.   [name] Mayo M.D on 06/13/2018 at 5:08 PM  Between 7am to 6pm - Pager [phone]  After 6pm go to [email or web address] - Proofreader  [facility]  Office  [phone]  CC: Primary care physician; Birdie Sons, MD  Note: This dictation was prepared with Dragon dictation along with smaller phrase technology. Any transcriptional errors that result from this process are unintentional.

## 2018-06-13 NOTE — Progress Notes (Signed)
*  PRELIMINARY RESULTS* Echocardiogram 2D Echocardiogram has been performed.  Vicki Morales 06/13/2018, 1:18 PM

## 2018-06-13 NOTE — Care Management (Signed)
Anticipate patient will discharge to skilled nursing within the next 24-48 hours.  Transitioned to oral lasix today and will transition to oral steroid 1.8

## 2018-06-14 LAB — CBC
HCT: 37.6 % (ref 36.0–46.0)
Hemoglobin: 12.2 g/dL (ref 12.0–15.0)
MCH: 29 pg (ref 26.0–34.0)
MCHC: 32.4 g/dL (ref 30.0–36.0)
MCV: 89.3 fL (ref 80.0–100.0)
Platelets: 283 10*3/uL (ref 150–400)
RBC: 4.21 MIL/uL (ref 3.87–5.11)
RDW: 14.2 % (ref 11.5–15.5)
WBC: 6.8 10*3/uL (ref 4.0–10.5)
nRBC: 0 % (ref 0.0–0.2)

## 2018-06-14 LAB — BASIC METABOLIC PANEL
Anion gap: 11 (ref 5–15)
BUN: 80 mg/dL — AB (ref 8–23)
CO2: 35 mmol/L — ABNORMAL HIGH (ref 22–32)
Calcium: 9.3 mg/dL (ref 8.9–10.3)
Chloride: 78 mmol/L — ABNORMAL LOW (ref 98–111)
Creatinine, Ser: 1.6 mg/dL — ABNORMAL HIGH (ref 0.44–1.00)
GFR calc Af Amer: 35 mL/min — ABNORMAL LOW (ref >60)
GFR, EST NON AFRICAN AMERICAN: 30 mL/min — AB (ref >60)
Glucose, Bld: 136 mg/dL — ABNORMAL HIGH (ref 70–99)
Potassium: 4.8 mmol/L (ref 3.5–5.1)
Sodium: 124 mmol/L — ABNORMAL LOW (ref 135–145)

## 2018-06-14 LAB — GLUCOSE, CAPILLARY
GLUCOSE-CAPILLARY: 117 mg/dL — AB (ref 70–99)
Glucose-Capillary: 119 mg/dL — ABNORMAL HIGH (ref 70–99)
Glucose-Capillary: 119 mg/dL — ABNORMAL HIGH (ref 70–99)

## 2018-06-14 LAB — CULTURE, BLOOD (ROUTINE X 2)
Culture: NO GROWTH
Culture: NO GROWTH
SPECIAL REQUESTS: ADEQUATE
Special Requests: ADEQUATE

## 2018-06-14 LAB — TROPONIN I: Troponin I: <0.03 ng/mL (ref <0.03)

## 2018-06-14 MED ORDER — ALBUTEROL SULFATE (2.5 MG/3ML) 0.083% IN NEBU
2.5000 mg | INHALATION_SOLUTION | Freq: Four times a day (QID) | RESPIRATORY_TRACT | Status: DC
  Administered 2018-06-14 – 2018-06-22 (×25): 2.5 mg via RESPIRATORY_TRACT
  Filled 2018-06-14 (×28): qty 3

## 2018-06-14 MED ORDER — ALBUTEROL SULFATE (2.5 MG/3ML) 0.083% IN NEBU
2.5000 mg | INHALATION_SOLUTION | RESPIRATORY_TRACT | Status: DC | PRN
  Administered 2018-06-14: 2.5 mg via RESPIRATORY_TRACT
  Filled 2018-06-14: qty 3

## 2018-06-14 MED ORDER — ALPRAZOLAM 0.5 MG PO TABS
0.5000 mg | ORAL_TABLET | Freq: Two times a day (BID) | ORAL | Status: DC | PRN
  Administered 2018-06-14 – 2018-06-17 (×4): 0.5 mg via ORAL
  Filled 2018-06-14 (×3): qty 1

## 2018-06-14 NOTE — Progress Notes (Signed)
[place] at [place] NAME: Vicki Morales    MR#:  [id number]  DATE OF BIRTH:  July 22, 1940  SUBJECTIVE:   Very anxious, having shortness of breath, sodium is 124, has some cough.  REVIEW OF SYSTEMS:  Review of Systems  Constitutional: Positive for malaise/fatigue. Negative for chills and fever.  HENT: Negative for congestion and sore throat.   Eyes: Negative for blurred vision and double vision.  Respiratory: Positive for cough and shortness of breath.   Cardiovascular: Negative for chest pain and leg swelling.  Gastrointestinal: Negative for nausea and vomiting.  Genitourinary: Negative for dysuria and urgency.  Musculoskeletal: Negative for back pain and neck pain.  Neurological: Negative for dizziness and headaches.  Psychiatric/Behavioral: Negative for depression. The patient is nervous/anxious.     DRUG ALLERGIES:   Allergies  Allergen Reactions  . Arthrotec [Diclofenac-Misoprostol] Hives  . Codeine Other (See Comments)    Headache   . Diclofenac Other (See Comments) and Hives    Pt unsure allergy  . Hydrochlorothiazide Other (See Comments)    Weakness   . Penicillins Hives    Has patient had a PCN reaction causing immediate rash, facial/tongue/throat swelling, SOB or lightheadedness with hypotension: No Has patient had a PCN reaction causing severe rash involving mucus membranes or skin necrosis: No Has patient had a PCN reaction that required hospitalization: No Has patient had a PCN reaction occurring within the last 10 years: No If all of the above answers are "NO", then may proceed with Cephalosporin use.  . Sulfa Antibiotics Hives  . Tiotropium Bromide Monohydrate Other (See Comments)    Blurry vision   . Tylenol [Acetaminophen] Other (See Comments)    Doesn't work  . Morphine Hives and Rash  . Pantoprazole Rash   VITALS:  Blood pressure (!) 130/48, pulse 75, temperature 97.6 F (36.4 C), temperature source Oral,  resp. rate 20, height 5\' 5"  (1.651 m), weight 109.2 kg, SpO2 93 %. PHYSICAL EXAMINATION:  Physical Exam  GENERAL:  78 y.o.-year-old patient sitting up in chair, in NAD EYES: Pupils equal, round, reactive to light and accommodation. No scleral icterus. Extraocular muscles intact.  HEENT: Head atraumatic, normocephalic. Oropharynx and nasopharynx clear.  NECK:  Supple, no jugular venous distention. No thyroid enlargement, no tenderness.  LUNGS:  + Decreased air movement and wheezing throughout all lung fields.  Crackles.  Nasal cannula in place.  Able to speak in full sentences.  Normal work of breathing. CARDIOVASCULAR: S1, S2 normal. No murmurs, rubs, or gallops.  ABDOMEN: Soft, nontender, nondistended. Bowel sounds present. No organomegaly or mass.  EXTREMITIES: + Nonpitting bilateral lower extremity edema, no cyanosis, or clubbing.  NEUROLOGIC:  Cranial nerves II through XII grossly intact, no focal deficits, +global weakness, sensation intact PSYCHIATRIC: The patient is alert and oriented x 2 SKIN: No obvious rash, lesion, or ulcer.  LABORATORY PANEL:  Female CBC Recent Labs  Lab 06/14/18 0601  WBC 6.8  HGB 12.2  HCT 37.6  PLT 283   ------------------------------------------------------------------------------------------------------------------ Chemistries  Recent Labs  Lab 06/14/18 0601  NA 124*  K 4.8  CL 78*  CO2 35*  GLUCOSE 136*  BUN 80*  CREATININE 1.60*  CALCIUM 9.3   RADIOLOGY:  No results found. ASSESSMENT AND PLAN:   Acute hypercapnic/hypoxemic respiratory failure- secondary to CHF and COPD exacerbation.  Still wheezing.  Continue oxygen, bronchodilators steroids are changed to p.o. steroids steroids,  Acute on chronic diastolic congestive heart failure exacerbation-echo showed EF 65  to 70%.,  Hold Lasix because of hyponatremia watch closely, discontinue magnesium supplements as well.  Acute COPD exacerbation-improving  Scheduled bronchodilators,  prednisone  AKI-worsening kidney function, hold Lasix,  -Recheck in the morning  Normocytic anemia-hemoglobin 10.5.  May be dilutional secondary to volume overload state. -Monitor  Chronic atrial fibrillation- rate controlled -Continue Eliquis and amiodarone Hyponatremia likely dilutional, patient is drinking a lot of water, start fluid restriction up to 1 L a day, check sodium tomorrow.  Patient is swollen but because of hyponatremia, acute renal failure holding the Lasix today.  Anxiety: Start Xanax today She can likely be discharged to SNF tomorrow.  All the records are reviewed and case discussed with Care Management/Social Worker. Management plans discussed with the patient, family and they are in agreement.  CODE STATUS: DNR  TOTAL TIME TAKING CARE OF THIS PATIENT: 35 minutes.   More than 50% of the time was spent in counseling/coordination of care: YES  POSSIBLE D/C tomorrow, DEPENDING ON CLINICAL CONDITION.   Epifanio Lesches M.D on 06/14/2018 at 2:05 PM  Between 7am to 6pm - Pager - [phone]  After 6pm go to [email or web address] - Proofreader  [facility]  Office  [phone]  CC: Primary care physician; Birdie Sons, MD  Note: This dictation was prepared with Dragon dictation along with smaller phrase technology. Any transcriptional errors that result from this process are unintentional.

## 2018-06-14 NOTE — Care Management Important Message (Signed)
Important Message  Patient Details  Name: Vicki Morales MRN: [id number] Date of Birth: 1940-10-19   Medicare Important Message Given:  Yes    [name] A Murad Staples 06/14/2018, 11:07 AM

## 2018-06-15 ENCOUNTER — Inpatient Hospital Stay: Payer: Medicare HMO

## 2018-06-15 LAB — BASIC METABOLIC PANEL
ANION GAP: 7 (ref 5–15)
BUN: 79 mg/dL — ABNORMAL HIGH (ref 8–23)
CALCIUM: 8.6 mg/dL — AB (ref 8.9–10.3)
CO2: 38 mmol/L — AB (ref 22–32)
Chloride: 79 mmol/L — ABNORMAL LOW (ref 98–111)
Creatinine, Ser: 1.33 mg/dL — ABNORMAL HIGH (ref 0.44–1.00)
GFR calc Af Amer: 44 mL/min — ABNORMAL LOW (ref >60)
GFR calc non Af Amer: 38 mL/min — ABNORMAL LOW (ref >60)
Glucose, Bld: 83 mg/dL (ref 70–99)
Potassium: 4.9 mmol/L (ref 3.5–5.1)
Sodium: 124 mmol/L — ABNORMAL LOW (ref 135–145)

## 2018-06-15 LAB — GLUCOSE, CAPILLARY
Glucose-Capillary: 119 mg/dL — ABNORMAL HIGH (ref 70–99)
Glucose-Capillary: 77 mg/dL (ref 70–99)
Glucose-Capillary: 119 mg/dL — ABNORMAL HIGH (ref 70–99)
Glucose-Capillary: 138 mg/dL — ABNORMAL HIGH (ref 70–99)
Glucose-Capillary: 124 mg/dL — ABNORMAL HIGH (ref 70–99)

## 2018-06-15 MED ORDER — FUROSEMIDE 10 MG/ML IJ SOLN
40.0000 mg | Freq: Once | INTRAMUSCULAR | Status: AC
  Administered 2018-06-15: 13:00:00 40 mg via INTRAVENOUS
  Filled 2018-06-15: qty 4

## 2018-06-15 NOTE — Progress Notes (Signed)
Physical Therapy Treatment Patient Details Name: Vicki Morales MRN: [id number] DOB: 01/29/1941 Today's Date: 06/15/2018    History of Present Illness Patient is a 78 y/o female that presents with respiratory distress from rehab facility. She has a PMH of COPD, chronic a-fib, GERD, HTN, orthopnea, anxiety, and depression.     PT Comments    Pt asleep upon arrival but awoke with verbal cues.  Pt stated "I'm so tired" several times during session but wanting to work with therapy out of fear of getting weaker.  Partiipated in exercises as described below.  While pt put in max effort, range and reps were limited.  Mobility deferred this date.  O2 92%  With O2 support  HR 65 during session.   Follow Up Recommendations  SNF     Equipment Recommendations  Rolling walker with 5" wheels    Recommendations for Other Services       Precautions / Restrictions Precautions Precautions: Fall Restrictions Weight Bearing Restrictions: No    Mobility  Bed Mobility               General bed mobility comments: deferred due to lethargy  Transfers                 General transfer comment: deferred  Ambulation/Gait             General Gait Details: deferred   Stairs             Wheelchair Mobility    Modified Rankin (Stroke Patients Only)       Balance                                            Cognition Arousal/Alertness: Lethargic Behavior During Therapy: WFL for tasks assessed/performed Overall Cognitive Status: History of cognitive impairments - at baseline                                        Exercises Other Exercises Other Exercises: supine ankle pumps, quad sets, glut sets, heel slides, ab/add and SLR x 10 BLE    General Comments        Pertinent Vitals/Pain Pain Assessment: No/denies pain    Home Living                      Prior Function            PT Goals (current goals can now be  found in the care plan section) Progress towards PT goals: Not progressing toward goals - comment    Frequency    Min 2X/week      PT Plan Current plan remains appropriate    Co-evaluation              AM-PAC PT "6 Clicks" Mobility   Outcome Measure  Help needed turning from your back to your side while in a flat bed without using bedrails?: A Lot Help needed moving from lying on your back to sitting on the side of a flat bed without using bedrails?: A Lot Help needed moving to and from a bed to a chair (including a wheelchair)?: Total Help needed standing up from a chair using your arms (e.g., wheelchair or bedside chair)?: Total Help needed to walk in hospital room?:  Total Help needed climbing 3-5 steps with a railing? : Total 6 Click Score: 8    End of Session Equipment Utilized During Treatment: Oxygen Activity Tolerance: Patient limited by fatigue;Patient limited by lethargy Patient left: in bed;with call bell/phone within reach;with bed alarm set         Time: 4103-0131 PT Time Calculation (min) (ACUTE ONLY): 9 min  Charges:  $Therapeutic Exercise: 8-22 mins                     Chesley Noon, PTA 06/15/18, 12:36 PM

## 2018-06-15 NOTE — Progress Notes (Signed)
[place] [facility] [place] NAME: Sharni Negron    MR#:  [id number]  DATE OF BIRTH:  April 29, 1941  SUBJECTIVE:   Feels more shortness of breath today has some cough, more lethargic today.[name] Kitchen  REVIEW OF SYSTEMS:  Review of Systems  Constitutional: Positive for malaise/fatigue. Negative for chills and fever.  HENT: Negative for congestion and sore throat.   Eyes: Negative for blurred vision and double vision.  Respiratory: Positive for cough and shortness of breath.   Cardiovascular: Negative for chest pain and leg swelling.  Gastrointestinal: Negative for nausea and vomiting.  Genitourinary: Negative for dysuria and urgency.  Musculoskeletal: Negative for back pain and neck pain.  Neurological: Negative for dizziness and headaches.  Psychiatric/Behavioral: Negative for depression. The patient is nervous/anxious.     DRUG ALLERGIES:   Allergies  Allergen Reactions  . Arthrotec [Diclofenac-Misoprostol] Hives  . Codeine Other (See Comments)    Headache   . Diclofenac Other (See Comments) and Hives    Pt unsure allergy  . Hydrochlorothiazide Other (See Comments)    Weakness   . Penicillins Hives    Has patient had a PCN reaction causing immediate rash, facial/tongue/throat swelling, SOB or lightheadedness with hypotension: No Has patient had a PCN reaction causing severe rash involving mucus membranes or skin necrosis: No Has patient had a PCN reaction that required hospitalization: No Has patient had a PCN reaction occurring within the last 10 years: No If all of the above answers are "NO", then may proceed with Cephalosporin use.  . Sulfa Antibiotics Hives  . Tiotropium Bromide Monohydrate Other (See Comments)    Blurry vision   . Tylenol [Acetaminophen] Other (See Comments)    Doesn't work  . Morphine Hives and Rash  . Pantoprazole Rash   VITALS:  Blood pressure (!) 142/51, pulse 77, temperature 98.4 F (36.9 C), temperature source  Oral, resp. rate 17, height 5\' 5"  (1.651 m), weight 109.2 kg, SpO2 93 %. PHYSICAL EXAMINATION:  Physical Exam  GENERAL:  78 y.o.-year-old patient sitting up in chair, in NAD EYES: Pupils equal, round, reactive to light and accommodation. No scleral icterus. Extraocular muscles intact.  HEENT: Head atraumatic, normocephalic. Oropharynx and nasopharynx clear.  NECK:  Supple, no jugular venous distention. No thyroid enlargement, no tenderness.  LUNGS:  + Decreased air movement and wheezing throughout all lung fields.  Crackles.  Nasal cannula in place.  Able to speak in full sentences.  Normal work of breathing. CARDIOVASCULAR: S1, S2 normal. No murmurs, rubs, or gallops.  ABDOMEN: Soft, nontender, nondistended. Bowel sounds present. No organomegaly or mass.  EXTREMITIES: + Nonpitting bilateral lower extremity edema, no cyanosis, or clubbing.  NEUROLOGIC:  Cranial nerves II through XII grossly intact, no focal deficits, +global weakness, sensation intact PSYCHIATRIC: The patient is alert and oriented x 2 SKIN: No obvious rash, lesion, or ulcer.  LABORATORY PANEL:  Female CBC Recent Labs  Lab 06/14/18 0601  WBC 6.8  HGB 12.2  HCT 37.6  PLT 283   ------------------------------------------------------------------------------------------------------------------ Chemistries  Recent Labs  Lab 06/15/18 0630  NA 124*  K 4.9  CL 79*  CO2 38*  GLUCOSE 83  BUN 79*  CREATININE 1.33*  CALCIUM 8.6*   RADIOLOGY:  No results found. ASSESSMENT AND PLAN:   Acute hypercapnic/hypoxemic respiratory failure- secondary to CHF and COPD exacerbation.  Still wheezing.  Continue oxygen, bronchodilators steroids are changed to p.o. steroids steroids,  Acute on chronic diastolic congestive heart failure exacerbation-echo showed EF 65  to 70%., \Short of breath, start back IV Lasix today.  Patient hyponatremia stable, sodium 124 yesterday, today as well.   Acute COPD exacerbation-still has  wheezing,  Scheduled bronchodilators, prednisone  AKI-better,-check lites in the morning  normocytic anemia-hemoglobin 10.5.  May be dilutional secondary to volume overload state. -Monitor  Chronic atrial fibrillation- rate controlled -Continue Eliquis and amiodarone Hyponatremia likely dilutional, patient is drinking a lot of water, start fluid restriction up to 1 L a day, still has hyponatremia, start back on Lasix as she has evidence of volume overload with swelling, anasarca    Anxiety: Start Xanax today She can likely be discharged to SNF tomorrow.  All the records are reviewed and case discussed with Care Management/Social Worker. Management plans discussed with the patient, family and they are in agreement.  CODE STATUS: DNR  TOTAL TIME TAKING CARE OF THIS PATIENT: 35 minutes.   More than 50% of the time was spent in counseling/coordination of care: YES  POSSIBLE D/C tomorrow, DEPENDING ON CLINICAL CONDITION.   Epifanio Lesches M.D on 06/15/2018 at 10:56 AM  Between 7am to 6pm - Pager - [phone]  After 6pm go to [email or web address] - Proofreader  [facility]  Office  [phone]  CC: Primary care physician; Birdie Sons, MD  Note: This dictation was prepared with Dragon dictation along with smaller phrase technology. Any transcriptional errors that result from this process are unintentional.

## 2018-06-16 LAB — BASIC METABOLIC PANEL
Anion gap: 8 (ref 5–15)
BUN: 64 mg/dL — ABNORMAL HIGH (ref 8–23)
CALCIUM: 8.4 mg/dL — AB (ref 8.9–10.3)
CO2: 38 mmol/L — ABNORMAL HIGH (ref 22–32)
Chloride: 80 mmol/L — ABNORMAL LOW (ref 98–111)
Creatinine, Ser: 1.21 mg/dL — ABNORMAL HIGH (ref 0.44–1.00)
GFR calc Af Amer: 49 mL/min — ABNORMAL LOW (ref >60)
GFR calc non Af Amer: 43 mL/min — ABNORMAL LOW (ref >60)
Glucose, Bld: 126 mg/dL — ABNORMAL HIGH (ref 70–99)
Potassium: 4.2 mmol/L (ref 3.5–5.1)
Sodium: 126 mmol/L — ABNORMAL LOW (ref 135–145)

## 2018-06-16 LAB — GLUCOSE, CAPILLARY
GLUCOSE-CAPILLARY: 117 mg/dL — AB (ref 70–99)
Glucose-Capillary: 101 mg/dL — ABNORMAL HIGH (ref 70–99)
Glucose-Capillary: 134 mg/dL — ABNORMAL HIGH (ref 70–99)
Glucose-Capillary: 138 mg/dL — ABNORMAL HIGH (ref 70–99)

## 2018-06-16 LAB — CBC
HCT: 36.3 % (ref 36.0–46.0)
Hemoglobin: 11.8 g/dL — ABNORMAL LOW (ref 12.0–15.0)
MCH: 29.2 pg (ref 26.0–34.0)
MCHC: 32.5 g/dL (ref 30.0–36.0)
MCV: 89.9 fL (ref 80.0–100.0)
Platelets: 223 10*3/uL (ref 150–400)
RBC: 4.04 MIL/uL (ref 3.87–5.11)
RDW: 14.1 % (ref 11.5–15.5)
WBC: 7 10*3/uL (ref 4.0–10.5)
nRBC: 0 % (ref 0.0–0.2)

## 2018-06-16 LAB — URINALYSIS, COMPLETE (UACMP) WITH MICROSCOPIC
Bacteria, UA: NONE SEEN
Bilirubin Urine: NEGATIVE
Glucose, UA: NEGATIVE mg/dL
Hgb urine dipstick: NEGATIVE
Ketones, ur: NEGATIVE mg/dL
Leukocytes, UA: NEGATIVE
Nitrite: NEGATIVE
PH: 6 (ref 5.0–8.0)
Protein, ur: NEGATIVE mg/dL
SPECIFIC GRAVITY, URINE: 1.006 (ref 1.005–1.030)
Squamous Epithelial / HPF: NONE SEEN (ref 0–5)
WBC, UA: NONE SEEN WBC/hpf (ref 0–5)

## 2018-06-16 MED ORDER — FUROSEMIDE 10 MG/ML IJ SOLN
40.0000 mg | Freq: Two times a day (BID) | INTRAMUSCULAR | Status: DC
  Administered 2018-06-16 – 2018-06-20 (×7): 40 mg via INTRAVENOUS
  Filled 2018-06-16 (×9): qty 4

## 2018-06-16 NOTE — Progress Notes (Signed)
[place] at [place] NAME: Vicki Morales    MR#:  [id number]  DATE OF BIRTH:  1941/01/22  SUBJECTIVE:   Shortness of breath better today, no dysuria but daughter is wondering if she has UTI. REVIEW OF SYSTEMS:  Review of Systems  Constitutional: Positive for malaise/fatigue. Negative for chills and fever.  HENT: Negative for congestion and sore throat.   Eyes: Negative for blurred vision and double vision.  Respiratory: Negative for cough and shortness of breath.   Cardiovascular: Negative for chest pain and leg swelling.  Gastrointestinal: Negative for nausea and vomiting.  Genitourinary: Negative for dysuria and urgency.  Musculoskeletal: Negative for back pain and neck pain.  Neurological: Negative for dizziness and headaches.  Psychiatric/Behavioral: Negative for depression. The patient is nervous/anxious.     DRUG ALLERGIES:   Allergies  Allergen Reactions  . Arthrotec [Diclofenac-Misoprostol] Hives  . Codeine Other (See Comments)    Headache   . Diclofenac Other (See Comments) and Hives    Pt unsure allergy  . Hydrochlorothiazide Other (See Comments)    Weakness   . Penicillins Hives    Has patient had a PCN reaction causing immediate rash, facial/tongue/throat swelling, SOB or lightheadedness with hypotension: No Has patient had a PCN reaction causing severe rash involving mucus membranes or skin necrosis: No Has patient had a PCN reaction that required hospitalization: No Has patient had a PCN reaction occurring within the last 10 years: No If all of the above answers are "NO", then may proceed with Cephalosporin use.  . Sulfa Antibiotics Hives  . Tiotropium Bromide Monohydrate Other (See Comments)    Blurry vision   . Tylenol [Acetaminophen] Other (See Comments)    Doesn't work  . Morphine Hives and Rash  . Pantoprazole Rash   VITALS:  Blood pressure (!) 145/49, pulse 66, temperature 97.6 F (36.4 C), resp. rate  19, height 5\' 5"  (1.651 m), weight 109.2 kg, SpO2 96 %. PHYSICAL EXAMINATION:  Physical Exam  GENERAL:  78 y.o.-year-old patient sitting up in chair, in NAD EYES: Pupils equal, round, reactive to light and accommodation. No scleral icterus. Extraocular muscles intact.  HEENT: Head atraumatic, normocephalic. Oropharynx and nasopharynx clear.  NECK:  Supple, no jugular venous distention. No thyroid enlargement, no tenderness.  LUNGS:  Diminished air entry bilaterally but no wheezing or rails.[name] Kitchen CARDIOVASCULAR: S1, S2 normal. No murmurs, rubs, or gallops.  ABDOMEN: Soft, nontender, nondistended. Bowel sounds present. No organomegaly or mass.  EXTREMITIES: + Nonpitting bilateral lower extremity edema, no cyanosis, or clubbing.  NEUROLOGIC:  Cranial nerves II through XII grossly intact, no focal deficits, +global weakness, sensation intact PSYCHIATRIC: The patient is alert and oriented x 2 SKIN: No obvious rash, lesion, or ulcer.  LABORATORY PANEL:  Female CBC Recent Labs  Lab 06/16/18 0422  WBC 7.0  HGB 11.8*  HCT 36.3  PLT 223   ------------------------------------------------------------------------------------------------------------------ Chemistries  Recent Labs  Lab 06/16/18 0422  NA 126*  K 4.2  CL 80*  CO2 38*  GLUCOSE 126*  BUN 64*  CREATININE 1.21*  CALCIUM 8.4*   RADIOLOGY:  Dg Chest Port 1 View  Result Date: 06/15/2018 CLINICAL DATA:  Shortness of breath, weakness EXAM: PORTABLE CHEST 1 VIEW COMPARISON:  06/09/2018 FINDINGS: Cardiomegaly with vascular congestion. No overt edema. No confluent opacities or effusions. No acute bony abnormality. IMPRESSION: Cardiomegaly, vascular congestion. Electronically Signed   By: Rolm Baptise M.D.   On: 06/15/2018 11:25   ASSESSMENT AND PLAN:  Acute hypercapnic/hypoxemic respiratory failure- secondary to CHF and COPD exacerbation.  Still wheezing.  Continue oxygen, bronchodilators steroids are changed to p.o. steroids  steroids,  Acute on chronic diastolic congestive heart failure exacerbation-echo showed EF 65 to 70%, started back on Lasix, she is feeling much better today, sodium also is improved from 1 24-1 26. Patient hypoxia also improved, she is on 2 L, saturation 96%.  Acute COPD exacerbation-less wheezing today.  Continue bronchodilators, steroids.  Scheduled bronchodilators, prednisone  AKI-better, improved.. Chronic atrial fibrillation- rate controlled -Continue Eliquis and amiodarone Hyponatremia likely dilutional, start water restriction up to 1 L a day, continue Lasix IV, patient sodium is better today from 1 24-1 26.  Continue IV Lasix for another day. Deconditioning, seen by physical therapy yesterday but she did not participate much, daughter is requesting eval again because she was able to walk little bit before she came to hospital.  Seen by physical therapy yesterday but patient did not participate, patient is eager to participate today.  Anxiety: Start Xanax today   Hopeful that we can discharge her to skilled nursing tomorrow if she feels better and also sodium improves.  All the records are reviewed and case discussed with Care Management/Social Worker. Management plans discussed with the patient, family and they are in agreement.  CODE STATUS: DNR  TOTAL TIME TAKING CARE OF THIS PATIENT: 35 minutes.   More than 50% of the time was spent in counseling/coordination of care: YES  POSSIBLE D/C tomorrow, DEPENDING ON CLINICAL CONDITION.   Epifanio Lesches M.D on 06/16/2018 at 10:58 AM  Between 7am to 6pm - Pager - [phone]  After 6pm go to [email or web address] - Proofreader  [facility]  Office  [phone]  CC: Primary care physician; Birdie Sons, MD  Note: This dictation was prepared with Dragon dictation along with smaller phrase technology. Any transcriptional errors that result from this process are unintentional.

## 2018-06-16 NOTE — Care Management Important Message (Signed)
Important Message  Patient Details  Name: Vicki Morales MRN: [id number] Date of Birth: 1941-02-13   Medicare Important Message Given:  Yes    [name] A Inza Mikrut 06/16/2018, 11:12 AM

## 2018-06-17 DIAGNOSIS — F331 Major depressive disorder, recurrent, moderate: Secondary | ICD-10-CM

## 2018-06-17 LAB — BASIC METABOLIC PANEL
ANION GAP: 8 (ref 5–15)
BUN: 46 mg/dL — ABNORMAL HIGH (ref 8–23)
CO2: 42 mmol/L — ABNORMAL HIGH (ref 22–32)
Calcium: 8.6 mg/dL — ABNORMAL LOW (ref 8.9–10.3)
Chloride: 81 mmol/L — ABNORMAL LOW (ref 98–111)
Creatinine, Ser: 0.97 mg/dL (ref 0.44–1.00)
GFR calc Af Amer: >60 mL/min (ref >60)
GFR calc non Af Amer: 56 mL/min — ABNORMAL LOW (ref >60)
Glucose, Bld: 109 mg/dL — ABNORMAL HIGH (ref 70–99)
POTASSIUM: 4.1 mmol/L (ref 3.5–5.1)
Sodium: 131 mmol/L — ABNORMAL LOW (ref 135–145)

## 2018-06-17 LAB — GLUCOSE, CAPILLARY
GLUCOSE-CAPILLARY: 139 mg/dL — AB (ref 70–99)
GLUCOSE-CAPILLARY: 119 mg/dL — AB (ref 70–99)
Glucose-Capillary: 93 mg/dL (ref 70–99)
Glucose-Capillary: 77 mg/dL (ref 70–99)

## 2018-06-17 MED ORDER — HYDROCORTISONE 2.5 % RE CREA
1.0000 "application " | TOPICAL_CREAM | Freq: Four times a day (QID) | RECTAL | Status: DC | PRN
  Filled 2018-06-17: qty 28.35

## 2018-06-17 NOTE — Progress Notes (Signed)
Chaplain responded to n OR for support. Chaplain practiced active listening as Pt talked about not wanting to be a burden to her daughter [name]) and son [name]). She is afraid she is a financial burden to them due to her health issues. She lives with her unmarried daughter and have a difficult time talking about her feelings because her daughter cries when se want to discuss her feeling of wanting to stop treatments. Chaplain asked reflective questions to dig deeper into her thoughts and feelings. Chaplain will return when her daughter arrivals to sit with Pt as she tries to discuss  Her feelings. Daughter is a strong [employer].    06/17/18 0900  Clinical Encounter Type  Visited With Patient  Visit Type Initial;Spiritual support  Referral From Nurse  Consult/Referral To Chaplain  Spiritual Encounters  Spiritual Needs Prayer;Emotional

## 2018-06-17 NOTE — Consult Note (Addendum)
[facility] Face-to-Face Psychiatry Consult   Reason for Consult:  Depression Referring Physician:  Dr. Governor Specking Patient Identification: Vicki Morales MRN:  [id number] Principal Diagnosis: Depression Diagnosis:  Principal Problem:   Depression Active Problems:   Acute respiratory failure ([place])   Total Time spent with patient: 30 minutes  Subjective:   Vicki Morales is a 78 y.o. female patient admitted with SOB.[name] Kitchen  HPI:  Vicki Morales is 78 years old Caucasian female with past psychiatric history of MDD, anxiety disorder, restless leg syndrome and medical history of COPD and CHF, admitted on the medical floor after she was brought to the ER with shortness of breath secondary to COPD and CHF.  Psychiatry was consulted for further evaluation of depression and anxiety and medication recommendations.  And assessed at bedside.  During the assessment patient is overall cooperative.  She is alert and oriented x3.  She does not need to be manic or psychotic and is able to provide a coherent history.  Patient reports a history of MDD and anxiety, with 1 previous psychiatric hospitalization 1979 after OD.  Patient reports receiving her outpatient treatment from [facility], she does not remember the medications prescribed, however reports compliance with the medications.  Patient reports " I got bad nerves" and also reports current worsening of depression due to her medical issues and being on and off in the hospital.  Patient reports this a.m. she was thinking about being a burden on her son and her daughter.  However, she denies any suicidal/homicidal ideations, intents or plans.  Patient reports that she is a strong believer of Jesus, and she trust Jesus and suicide is against the her religious beliefs.  Furthermore, she reports not doing anything in that would affect her son or daughter.  However, patient reports that she got anxious thinking about financial issues of her treatment and about her insurance  details.  She reports feeling fatigued/tired, helpless, however she denies any hopelessness.  Denies issues with sleep other than in the hospital not able to sleep due to frequent checks in.  She denies any auditory/visual hallucinations or other symptoms of psychosis.  Past Psychiatric History: Major depressive disorder, anxiety disorder, restless leg syndrome  Risk to Self:  Denies suicidal ideations, intents or plans Risk to Others:  Denies homicidal ideations, intents or plans Prior Inpatient Therapy:  1 previous psychiatric hospitalization in 1979 Prior Outpatient Therapy:  Outpatient medication from psychiatrist and PCP.  Past Medical History:  Past Medical History:  Diagnosis Date  . Anxiety   . Arthritis   . Cataract    right eye  . COPD (chronic obstructive pulmonary disease) ([place])   . Depression   . Dyspnea    DOE  . Dysrhythmia   . Edema    FEET/ANKLES  . GERD (gastroesophageal reflux disease)   . H/O wheezing   . History of hiatal hernia   . HOH (hard of hearing)   . Hypertension   . Incontinence of urine in female   . Orthopnea   . Pain    CHRONIC KNEE  . Pain    CHRONIC KNEE  . Paranoid disorder ([place])   . RLS (restless legs syndrome)   . Tremors of nervous system    HANDS    Past Surgical History:  Procedure Laterality Date  . ABDOMINAL HYSTERECTOMY  1988  . APPENDECTOMY  1988  . CARDIAC CATHETERIZATION    . CATARACT EXTRACTION W/PHACO Right 07/14/2016   Procedure: CATARACT EXTRACTION PHACO AND INTRAOCULAR LENS  PLACEMENT ([place]) anterior vitrectomy;  Surgeon: Estill Cotta, MD;  Location: [facility] ORS;  Service: Ophthalmology;  Laterality: Right;  [place] 02:40 AP% 24.4 CDE 59.98 Fluid pack # [id number]  . CATARACT EXTRACTION W/PHACO Left 02/16/2017   Procedure: CATARACT EXTRACTION PHACO AND INTRAOCULAR LENS PLACEMENT (IOC);  Surgeon: Estill Cotta, MD;  Location: [facility] ORS;  Service: Ophthalmology;  Laterality: Left;  Lot # [id number] H [place]: 01:11.8 AP%:  23.5 CDE: 32.09   . ESOPHAGOGASTRODUODENOSCOPY N/A 11/11/2014   Procedure: ESOPHAGOGASTRODUODENOSCOPY (EGD);  Surgeon: Josefine Class, MD;  Location: [facility] ENDOSCOPY;  Service: Endoscopy;  Laterality: N/A;  . TONSILLECTOMY AND ADENOIDECTOMY    . TRIGGER FINGER RELEASE  2004   Family History:  Family History  Problem Relation Age of Onset  . Breast cancer Sister   . Stroke Father   . Emphysema Father   . Anxiety disorder Father   . Depression Father   . Anxiety disorder Brother    Family Psychiatric  History: Denies Social History:  Social History   Substance and Sexual Activity  Alcohol Use No  . Alcohol/week: 0.0 standard drinks     Social History   Substance and Sexual Activity  Drug Use No    Social History   Socioeconomic History  . Marital status: Widowed    Spouse name: Not on file  . Number of children: 2  . Years of education: Not on file  . Highest education level: 12th grade  Occupational History  . 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 Financial resource strain: Not hard at all  . Food insecurity:    Worry: Never true    Inability: Never true  . Transportation needs:    Medical: No    Non-medical: No  Tobacco Use  . Smoking status: Former Smoker    Years: 30.00  . Smokeless tobacco: Never Used  . Tobacco comment: quit in 1989  Substance and Sexual Activity  . Alcohol use: No    Alcohol/week: 0.0 standard drinks  . Drug use: No  . Sexual activity: Not Currently  Lifestyle  . Physical activity:    Days per week: 0 days    Minutes per session: 0 min  . Stress: Only a little  Relationships  . Social connections:    Talks on phone: Not on file    Gets together: Not on file    Attends religious service: Not on file    Active member of club or organization: Not on file    Attends meetings of clubs or organizations: Not on file    Relationship status: Not on file  Other Topics Concern  . Not on file  Social History Narrative    Lives at home with her daughter.  Ambulates with a walker at baseline   Additional Social History:    Allergies:   Allergies  Allergen Reactions  . Arthrotec [Diclofenac-Misoprostol] Hives  . Codeine Other (See Comments)    Headache   . Diclofenac Other (See Comments) and Hives    Pt unsure allergy  . Hydrochlorothiazide Other (See Comments)    Weakness   . Penicillins Hives    Has patient had a PCN reaction causing immediate rash, facial/tongue/throat swelling, SOB or lightheadedness with hypotension: No Has patient had a PCN reaction causing severe rash involving mucus membranes or skin necrosis: No Has patient had a PCN reaction that required hospitalization: No Has patient had a PCN reaction occurring within the last 10 years: No If all of the  above answers are "NO", then may proceed with Cephalosporin use.  . Sulfa Antibiotics Hives  . Tiotropium Bromide Monohydrate Other (See Comments)    Blurry vision   . Tylenol [Acetaminophen] Other (See Comments)    Doesn't work  . Morphine Hives and Rash  . Pantoprazole Rash    Labs:  Results for orders placed or performed during the hospital encounter of 06/09/18 (from the past 48 hour(s))  Glucose, capillary     Status: Abnormal   Collection Time: 06/15/18  4:30 PM  Result Value Ref Range   Glucose-Capillary 138 (H) 70 - 99 mg/dL  Glucose, capillary     Status: Abnormal   Collection Time: 06/15/18  9:56 PM  Result Value Ref Range   Glucose-Capillary 124 (H) 70 - 99 mg/dL  Basic metabolic panel     Status: Abnormal   Collection Time: 06/16/18  4:22 AM  Result Value Ref Range   Sodium 126 (L) 135 - 145 mmol/L   Potassium 4.2 3.5 - 5.1 mmol/L   Chloride 80 (L) 98 - 111 mmol/L   CO2 38 (H) 22 - 32 mmol/L   Glucose, Bld 126 (H) 70 - 99 mg/dL   BUN 64 (H) 8 - 23 mg/dL   Creatinine, Ser 1.21 (H) 0.44 - 1.00 mg/dL   Calcium 8.4 (L) 8.9 - 10.3 mg/dL   GFR calc non Af Amer 43 (L) >60 mL/min   GFR calc Af Amer 49 (L) >60  mL/min   Anion gap 8 5 - 15    Comment: Performed at [facility], [address] [place], [place] [address]  CBC     Status: Abnormal   Collection Time: 06/16/18  4:22 AM  Result Value Ref Range   WBC 7.0 4.0 - 10.5 K/uL   RBC 4.04 3.87 - 5.11 MIL/uL   Hemoglobin 11.8 (L) 12.0 - 15.0 g/dL   HCT 36.3 36.0 - 46.0 %   MCV 89.9 80.0 - 100.0 fL   MCH 29.2 26.0 - 34.0 pg   MCHC 32.5 30.0 - 36.0 g/dL   RDW 14.1 11.5 - 15.5 %   Platelets 223 150 - 400 K/uL   nRBC 0.0 0.0 - 0.2 %    Comment: Performed at [facility], [address] [place], [place] [address]  Glucose, capillary     Status: Abnormal   Collection Time: 06/16/18  7:38 AM  Result Value Ref Range   Glucose-Capillary 101 (H) 70 - 99 mg/dL  Glucose, capillary     Status: Abnormal   Collection Time: 06/16/18 11:39 AM  Result Value Ref Range   Glucose-Capillary 117 (H) 70 - 99 mg/dL  Urinalysis, Complete w Microscopic     Status: Abnormal   Collection Time: 06/16/18  4:18 PM  Result Value Ref Range   Color, Urine STRAW (A) YELLOW   APPearance CLEAR (A) CLEAR   Specific Gravity, Urine 1.006 1.005 - 1.030   pH 6.0 5.0 - 8.0   Glucose, UA NEGATIVE NEGATIVE mg/dL   Hgb urine dipstick NEGATIVE NEGATIVE   Bilirubin Urine NEGATIVE NEGATIVE   Ketones, ur NEGATIVE NEGATIVE mg/dL   Protein, ur NEGATIVE NEGATIVE mg/dL   Nitrite NEGATIVE NEGATIVE   Leukocytes, UA NEGATIVE NEGATIVE   WBC, UA NONE SEEN 0 - 5 WBC/hpf   Bacteria, UA NONE SEEN NONE SEEN   Squamous Epithelial / LPF NONE SEEN 0 - 5   Mucus PRESENT     Comment: Performed at [facility], [address] [place], [place] [address]  Glucose, capillary     Status: Abnormal   Collection Time: 06/16/18  4:46 PM  Result Value Ref Range   Glucose-Capillary 134 (H) 70 - 99 mg/dL  Glucose, capillary     Status: Abnormal   Collection Time: 06/16/18  8:55 PM  Result Value Ref Range   Glucose-Capillary 138 (H) 70 - 99 mg/dL  Basic metabolic panel      Status: Abnormal   Collection Time: 06/17/18  7:15 AM  Result Value Ref Range   Sodium 131 (L) 135 - 145 mmol/L   Potassium 4.1 3.5 - 5.1 mmol/L   Chloride 81 (L) 98 - 111 mmol/L   CO2 42 (H) 22 - 32 mmol/L   Glucose, Bld 109 (H) 70 - 99 mg/dL   BUN 46 (H) 8 - 23 mg/dL   Creatinine, Ser 0.97 0.44 - 1.00 mg/dL   Calcium 8.6 (L) 8.9 - 10.3 mg/dL   GFR calc non Af Amer 56 (L) >60 mL/min   GFR calc Af Amer >60 >60 mL/min   Anion gap 8 5 - 15    Comment: Performed at [facility], [address] [place], [place] [address]  Glucose, capillary     Status: None   Collection Time: 06/17/18  7:33 AM  Result Value Ref Range   Glucose-Capillary 93 70 - 99 mg/dL  Glucose, capillary     Status: None   Collection Time: 06/17/18 11:48 AM  Result Value Ref Range   Glucose-Capillary 77 70 - 99 mg/dL    Current Facility-Administered Medications  Medication Dose Route Frequency Provider Last Rate Last Dose  . 0.9 %  sodium chloride infusion  250 mL Intravenous PRN Pyreddy, Reatha Harps, MD      . acidophilus (RISAQUAD) capsule 1 capsule  1 capsule Oral TID WC Pyreddy, Reatha Harps, MD   1 capsule at 06/17/18 1227  . albuterol (PROVENTIL) (2.5 MG/3ML) 0.083% nebulizer solution 2.5 mg  2.5 mg Nebulization Q6H Arta Silence, MD   2.5 mg at 06/17/18 0816  . albuterol (PROVENTIL) (2.5 MG/3ML) 0.083% nebulizer solution 2.5 mg  2.5 mg Nebulization Q4H PRN Arta Silence, MD   2.5 mg at 06/14/18 0301  . ALPRAZolam Duanne Moron) tablet 0.5 mg  0.5 mg Oral QHS PRN Saundra Shelling, MD   0.5 mg at 06/16/18 2254  . ALPRAZolam Duanne Moron) tablet 0.5 mg  0.5 mg Oral BID PRN Epifanio Lesches, MD   0.5 mg at 06/17/18 1227  . amiodarone (PACERONE) tablet 200 mg  200 mg Oral BID Saundra Shelling, MD   200 mg at 06/17/18 0857  . apixaban (ELIQUIS) tablet 5 mg  5 mg Oral BID Saundra Shelling, MD   5 mg at 06/17/18 0857  . aspirin EC tablet 81 mg  81 mg Oral Daily Saundra Shelling, MD   81 mg at 06/17/18 0857  . benzonatate  (TESSALON) capsule 200 mg  200 mg Oral TID PRN Mayo, Pete Pelt, MD      . busPIRone (BUSPAR) tablet 15 mg  15 mg Oral TID Saundra Shelling, MD   15 mg at 06/17/18 0855  . calcium-vitamin D (OSCAL WITH D) 500-200 MG-UNIT per tablet 1 tablet  1 tablet Oral BID Saundra Shelling, MD   1 tablet at 06/17/18 0857  . chlorhexidine (PERIDEX) 0.12 % solution 15 mL  15 mL Mouth Rinse BID Flora Lipps, MD   15 mL at 06/17/18 0858  . chlorpheniramine-HYDROcodone (TUSSIONEX) 10-8 MG/5ML suspension 5 mL  5 mL Oral Q12H  PRN Awilda Bill, NP   5 mL at 06/11/18 0208  . digoxin (LANOXIN) tablet 0.0625 mg  0.0625 mg Oral Daily Pyreddy, Reatha Harps, MD   0.0625 mg at 06/17/18 0856  . divalproex (DEPAKOTE ER) 24 hr tablet 250 mg  250 mg Oral BID Saundra Shelling, MD   250 mg at 06/17/18 0855  . feeding supplement (ENSURE ENLIVE) (ENSURE ENLIVE) liquid 237 mL  237 mL Oral BID BM Pyreddy, Pavan, MD   237 mL at 06/14/18 1342  . furosemide (LASIX) injection 40 mg  40 mg Intravenous BID Epifanio Lesches, MD   40 mg at 06/17/18 0858  . gabapentin (NEURONTIN) capsule 100 mg  100 mg Oral BID WC Pyreddy, Reatha Harps, MD   100 mg at 06/17/18 0856  . gabapentin (NEURONTIN) tablet 600 mg  600 mg Oral QHS Saundra Shelling, MD   600 mg at 06/16/18 2148  . guaiFENesin (MUCINEX) 12 hr tablet 600 mg  600 mg Oral BID Saundra Shelling, MD   600 mg at 06/17/18 0857  . guaiFENesin-dextromethorphan (ROBITUSSIN DM) 100-10 MG/5ML syrup 5 mL  5 mL Oral Q4H PRN Saundra Shelling, MD   5 mL at 06/13/18 2005  . hydrALAZINE (APRESOLINE) injection 10-20 mg  10-20 mg Intravenous Q4H PRN Awilda Bill, NP   10 mg at 06/11/18 0053  . hydrALAZINE (APRESOLINE) tablet 25 mg  25 mg Oral BID Awilda Bill, NP   25 mg at 06/17/18 0857  . insulin aspart (novoLOG) injection 0-15 Units  0-15 Units Subcutaneous TID WC Saundra Shelling, MD   2 Units at 06/16/18 1727  . insulin aspart (novoLOG) injection 0-5 Units  0-5 Units Subcutaneous QHS Pyreddy, Pavan, MD      . loperamide  (IMODIUM) capsule 2 mg  2 mg Oral PRN Pyreddy, Reatha Harps, MD      . meclizine (ANTIVERT) tablet 12.5 mg  12.5 mg Oral BID PRN Saundra Shelling, MD      . MEDLINE mouth rinse  15 mL Mouth Rinse q12n4p Flora Lipps, MD   15 mL at 06/17/18 1227  . Melatonin TABS 10 mg  10 mg Oral QHS Saundra Shelling, MD   10 mg at 06/16/18 2154  . menthol-cetylpyridinium (CEPACOL) lozenge 3 mg  1 lozenge Oral PRN Mayo, Pete Pelt, MD      . metoprolol tartrate (LOPRESSOR) injection 5 mg  5 mg Intravenous Q6H PRN Conforti, John, DO   5 mg at 06/10/18 1754  . multivitamin with minerals tablet 1 tablet  1 tablet Oral Daily Saundra Shelling, MD   1 tablet at 06/17/18 0856  . omega-3 acid ethyl esters (LOVAZA) capsule 1 g  1 g Oral Daily Pyreddy, Pavan, MD   1 g at 06/17/18 0857  . ondansetron (ZOFRAN) tablet 4 mg  4 mg Oral Q6H PRN Saundra Shelling, MD       Or  . ondansetron (ZOFRAN) injection 4 mg  4 mg Intravenous Q6H PRN Pyreddy, Reatha Harps, MD      . oxybutynin (DITROPAN-XL) 24 hr tablet 10 mg  10 mg Oral QHS Saundra Shelling, MD   10 mg at 06/16/18 2154  . polyethylene glycol (MIRALAX / GLYCOLAX) packet 17 g  17 g Oral BID Sela Hua, MD   17 g at 06/17/18 0857  . predniSONE (DELTASONE) tablet 50 mg  50 mg Oral Q breakfast Mayo, Pete Pelt, MD   50 mg at 06/17/18 0856  . protein supplement (PREMIER PROTEIN) liquid - approved for s/p bariatric surgery  11  oz Oral BID BM Saundra Shelling, MD   11 oz at 06/13/18 0953  . risperiDONE (RISPERDAL) tablet 1 mg  1 mg Oral QHS Pyreddy, Reatha Harps, MD   1 mg at 06/16/18 2155  . senna-docusate (Senokot-S) tablet 1 tablet  1 tablet Oral QHS PRN Pyreddy, Reatha Harps, MD      . sodium chloride flush (NS) 0.9 % injection 3 mL  3 mL Intravenous Q12H Pyreddy, Reatha Harps, MD   3 mL at 06/17/18 0859  . sodium chloride flush (NS) 0.9 % injection 3 mL  3 mL Intravenous PRN Pyreddy, Reatha Harps, MD      . traZODone (DESYREL) tablet 25 mg  25 mg Oral QHS PRN Awilda Bill, NP   25 mg at 06/11/18 0235  . vitamin B-12  (CYANOCOBALAMIN) tablet 1,000 mcg  1,000 mcg Oral Daily Saundra Shelling, MD   1,000 mcg at 06/17/18 0857  . vitamin C (ASCORBIC ACID) tablet 250 mg  250 mg Oral BID Saundra Shelling, MD   250 mg at 06/17/18 4580    Musculoskeletal: Strength & Muscle Tone: not assesed Gait & Station: pt lying on bed, not formerly assesed Patient leans: N/A  Psychiatric Specialty Exam: Physical Exam  Nursing note and vitals reviewed. Constitutional: She is oriented to person, place, and time.  HENT:  Head: Normocephalic and atraumatic.  Neurological: She is alert and oriented to person, place, and time.    Review of Systems  Constitutional: Positive for malaise/fatigue.  Psychiatric/Behavioral: Positive for depression. The patient is nervous/anxious.     Blood pressure (!) 119/48, pulse 61, temperature 97.6 F (36.4 C), temperature source Oral, resp. rate 16, height 5\' 5"  (1.651 m), weight 109.2 kg, SpO2 95 %.Body mass index is 40.06 kg/m.  General Appearance: Fairly Groomed  Eye Contact:  Fair  Speech:  Normal Rate  Volume:  Normal  Mood:  Anxious and Depressed  Affect:  Appropriate  Thought Process:  Coherent  Orientation:  Full (Time, Place, and Person)  Thought Content:  No evidence of delusions or hallucinations.  Suicidal Thoughts:  No  Homicidal Thoughts:  No  Memory:  Immediate;   Fair  Judgement:  Fair  Insight:  Fair  Psychomotor Activity:  Decreased  Concentration:  Concentration: Fair  Recall:  [employer] of Knowledge:  Fair  Language:  Good  Akathisia:  No  Handed:  Right  AIMS (if indicated):     Assets:  Communication Skills Resilience Social Support Others:  religious believes  ADL's:  Impaired  Cognition:  WNL  Sleep:        Treatment Plan Summary: 78 years old female with past psychiatric history of MDD, anxiety disorder, restless leg syndrome and medical history of COPD, CHF.  She denies any acute thoughts of harming self or others, denies any intents or plans.   She does not appear to be manic or psychotic and does not meet the criteria for inpatient psychiatric hospitalization.  MDD -Start Zoloft 25 mg p.o. daily with further optimization of medications as per response  Anxiety disorder -Increase gabapentin 300 mg p.o. twice daily and continue with 600 mg p.o. nightly. -Continue BuSpar as prescribed - Due to her age and Medical commodities, would recommend to wean off of Xanax and if benzos are required then in that case switch to Klonopin 0.25 mg PO TID  Medical comorbidities-COPD and CHF -Continue treatment as per primary team.   Disposition: Patient does not meet criteria for psychiatric inpatient admission.  Whitman Hero, MD 06/17/2018 1:03  PM

## 2018-06-17 NOTE — Progress Notes (Signed)
[facility] at [place] NAME: Vicki Morales    MR#:  [id number]  DATE OF BIRTH:  Jul 03, 1940  SUBJECTIVE: Shortness of breath improved.made comments about not having a good quality of life and wanting to stop treatments/all her medications  But does not want to be a burden to her daughter her chronic problems. REVIEW OF SYSTEMS:  Review of Systems  Constitutional: Positive for malaise/fatigue. Negative for chills and fever.  HENT: Negative for congestion and sore throat.   Eyes: Negative for blurred vision and double vision.  Respiratory: Negative for cough and shortness of breath.   Cardiovascular: Negative for chest pain and leg swelling.  Gastrointestinal: Negative for nausea and vomiting.  Genitourinary: Negative for dysuria and urgency.  Musculoskeletal: Negative for back pain and neck pain.  Neurological: Negative for dizziness and headaches.  Psychiatric/Behavioral: Negative for depression. The patient is nervous/anxious.     DRUG ALLERGIES:   Allergies  Allergen Reactions  . Arthrotec [Diclofenac-Misoprostol] Hives  . Codeine Other (See Comments)    Headache   . Diclofenac Other (See Comments) and Hives    Pt unsure allergy  . Hydrochlorothiazide Other (See Comments)    Weakness   . Penicillins Hives    Has patient had a PCN reaction causing immediate rash, facial/tongue/throat swelling, SOB or lightheadedness with hypotension: No Has patient had a PCN reaction causing severe rash involving mucus membranes or skin necrosis: No Has patient had a PCN reaction that required hospitalization: No Has patient had a PCN reaction occurring within the last 10 years: No If all of the above answers are "NO", then may proceed with Cephalosporin use.  . Sulfa Antibiotics Hives  . Tiotropium Bromide Monohydrate Other (See Comments)    Blurry vision   . Tylenol [Acetaminophen] Other (See Comments)    Doesn't work  . Morphine Hives and Rash    . Pantoprazole Rash   VITALS:  Blood pressure (!) 119/48, pulse 61, temperature 97.6 F (36.4 C), temperature source Oral, resp. rate 16, height 5\' 5"  (1.651 m), weight 109.2 kg, SpO2 95 %. PHYSICAL EXAMINATION:  Physical Exam  GENERAL:  78 y.o.-year-old patient sitting up in chair, in NAD EYES: Pupils equal, round, reactive to light and accommodation. No scleral icterus. Extraocular muscles intact.  HEENT: Head atraumatic, normocephalic. Oropharynx and nasopharynx clear.  NECK:  Supple, no jugular venous distention. No thyroid enlargement, no tenderness.  LUNGS:  Clear to auscultation.[name] Kitchen CARDIOVASCULAR: S1, S2 normal. No murmurs, rubs, or gallops.  ABDOMEN: Soft, nontender, nondistended. Bowel sounds present. No organomegaly or mass.  EXTREMITIES: + Nonpitting bilateral lower extremity edema, no cyanosis, or clubbing.  NEUROLOGIC:  Cranial nerves II through XII grossly intact, no focal deficits, +global weakness, sensation intact PSYCHIATRIC: The patient is alert and oriented x 2 SKIN: No obvious rash, lesion, or ulcer.  LABORATORY PANEL:  Female CBC Recent Labs  Lab 06/16/18 0422  WBC 7.0  HGB 11.8*  HCT 36.3  PLT 223   ------------------------------------------------------------------------------------------------------------------ Chemistries  Recent Labs  Lab 06/17/18 0715  NA 131*  K 4.1  CL 81*  CO2 42*  GLUCOSE 109*  BUN 46*  CREATININE 0.97  CALCIUM 8.6*   RADIOLOGY:  No results found. ASSESSMENT AND PLAN:   Acute hypercapnic/hypoxemic respiratory failure- secondary to CHF and COPD exacerbation.  Still wheezing.  Continue oxygen, bronchodilators steroids are changed to p.o. steroids steroids,  Acute on chronic diastolic congestive heart failure exacerbation-echo showed EF 65 to 70%, started back on  Lasix, she is feeling much better today, sodium improved from 1 24-1 31.  Likely discharge on Sunday to rehab.  Acute COPD exacerbation-less wheezing today.   Continue bronchodilators, steroids.  Scheduled bronchodilators, prednisone  AKI-better, improved.. Chronic atrial fibrillation- rate controlled -Continue Eliquis and amiodarone Hyponatremia likely dilutional, improved with Lasix, fluid restriction.  Continue fluid restriction up to 1.2 L to 4 hours  Deconditioning, physical therapy recommends SNF. Anxiety: Start Xanax today   All the records are reviewed and case discussed with Care Management/Social Worker. Management plans discussed with the patient, family and they are in agreement.  CODE STATUS: DNR  TOTAL TIME TAKING CARE OF THIS PATIENT: 35 minutes.   More than 50% of the time was spent in counseling/coordination of care: YES  POSSIBLE D/C tomorrow, DEPENDING ON CLINICAL CONDITION.   Epifanio Lesches M.D on 06/17/2018 at 11:31 AM  Between 7am to 6pm - Pager - [phone]  After 6pm go to [email or web address] - Proofreader  [employer] [facility]  Office  [phone]  CC: Primary care physician; Birdie Sons, MD  Note: This dictation was prepared with Dragon dictation along with smaller phrase technology. Any transcriptional errors that result from this process are unintentional.

## 2018-06-17 NOTE — Plan of Care (Signed)
  Problem: Health Behavior/Discharge Planning: Goal: Ability to manage health-related needs will improve Outcome: Progressing   Problem: Clinical Measurements: Goal: Ability to maintain clinical measurements within normal limits will improve Outcome: Progressing Goal: Will remain free from infection Outcome: Progressing Goal: Diagnostic test results will improve Outcome: Progressing Goal: Respiratory complications will improve Outcome: Progressing Goal: Cardiovascular complication will be avoided Outcome: Progressing   Problem: Activity: Goal: Risk for activity intolerance will decrease Outcome: Progressing   Problem: Elimination: Goal: Will not experience complications related to bowel motility Outcome: Progressing Goal: Will not experience complications related to urinary retention Outcome: Progressing   Problem: Skin Integrity: Goal: Risk for impaired skin integrity will decrease Outcome: Progressing   

## 2018-06-17 NOTE — Progress Notes (Signed)
Physical Therapy Treatment Patient Details Name: Vicki Morales MRN: [id number] DOB: 05-17-1941 Today's Date: 06/17/2018    History of Present Illness Patient is a 78 y/o female that presents with respiratory distress from rehab facility. She has a PMH of COPD, chronic a-fib, GERD, HTN, orthopnea, anxiety, and depression.     PT Comments    Pt reports she is very tired after taking a "nerve pill" and doesn't feel like she can work with therapy. Pt encouraged to at least perform bed exercises and she agrees. She is able to complete bed exercises as instructed but frequently closes her eyes and occasionally requires redirection. She is too somnolent to attempt bed mobility, transfers or ambulation at this time and does not feel like she has the energy to perform. Will continue to attempt to progress mobility at follow-up sessions. Pt will benefit from PT services to address deficits in strength, balance, and mobility in order to return to full function at home.    Follow Up Recommendations  SNF     Equipment Recommendations  Rolling walker with 5" wheels    Recommendations for Other Services       Precautions / Restrictions Precautions Precautions: Fall Restrictions Weight Bearing Restrictions: No    Mobility  Bed Mobility               General bed mobility comments: Pt reports she is very tired after taking a "nerve pill" and doesn't feel like she can work with therapy. Pt encouraged to at least perform bed exercises and she agrees. She is too somnolent to attempt bed mobility, transfers or ambulation at this time.   Transfers                    Ambulation/Gait                 Stairs             Wheelchair Mobility    Modified Rankin (Stroke Patients Only)       Balance                                            Cognition Arousal/Alertness: Lethargic Behavior During Therapy: WFL for tasks assessed/performed Overall  Cognitive Status: History of cognitive impairments - at baseline                                        Exercises General Exercises - Lower Extremity Ankle Circles/Pumps: Both;15 reps Quad Sets: Both;15 reps Gluteal Sets: Both;15 reps Short Arc Quad: Both;15 reps Heel Slides: Both;15 reps Hip ABduction/ADduction: Both;15 reps Straight Leg Raises: Both;15 reps    General Comments        Pertinent Vitals/Pain Pain Assessment: No/denies pain    Home Living                      Prior Function            PT Goals (current goals can now be found in the care plan section) Acute Rehab PT Goals Patient Stated Goal: To return home safely  PT Goal Formulation: With patient Time For Goal Achievement: 06/25/18 Potential to Achieve Goals: Good Progress towards PT goals: Not progressing toward goals - comment(Pt too lethargic today to  participate in mobility)    Frequency    Min 2X/week      PT Plan Current plan remains appropriate    Co-evaluation              AM-PAC PT "6 Clicks" Mobility   Outcome Measure  Help needed turning from your back to your side while in a flat bed without using bedrails?: A Lot Help needed moving from lying on your back to sitting on the side of a flat bed without using bedrails?: A Lot Help needed moving to and from a bed to a chair (including a wheelchair)?: Total Help needed standing up from a chair using your arms (e.g., wheelchair or bedside chair)?: Total Help needed to walk in hospital room?: Total Help needed climbing 3-5 steps with a railing? : Total 6 Click Score: 8    End of Session   Activity Tolerance: Patient limited by fatigue;Patient limited by lethargy Patient left: in bed;with call bell/phone within reach;with bed alarm set;with family/visitor present   PT Visit Diagnosis: Unsteadiness on feet (R26.81);Muscle weakness (generalized) (M62.81)     Time: 0034-9179 PT Time Calculation (min)  (ACUTE ONLY): 15 min  Charges:  $Therapeutic Exercise: 8-22 mins                     Lyndel Safe Raymondo Garcialopez PT, DPT, GCS    Vicki Morales 06/17/2018, 1:55 PM

## 2018-06-17 NOTE — Progress Notes (Signed)
Pt made comments about not having a good quality of life and wanting to stop treatments/all her medications. Chaplain consult ordered and encouraged to talk with her children about her thoughts. Will continue to assess.

## 2018-06-18 LAB — GLUCOSE, CAPILLARY
GLUCOSE-CAPILLARY: 89 mg/dL (ref 70–99)
Glucose-Capillary: 96 mg/dL (ref 70–99)
Glucose-Capillary: 123 mg/dL — ABNORMAL HIGH (ref 70–99)
Glucose-Capillary: 120 mg/dL — ABNORMAL HIGH (ref 70–99)

## 2018-06-18 MED ORDER — CLONAZEPAM 0.25 MG PO TBDP
0.2500 mg | ORAL_TABLET | Freq: Two times a day (BID) | ORAL | Status: DC
  Administered 2018-06-18 – 2018-06-23 (×9): 0.25 mg via ORAL
  Filled 2018-06-18 (×11): qty 1

## 2018-06-18 NOTE — Consult Note (Signed)
[facility] Face-to-Face Psychiatry Consult   Reason for Consult:  Depression Referring Physician:  Dr. Vianne Bulls Patient Identification: Vicki Morales MRN:  [id number] Principal Diagnosis: Depression Diagnosis:  Principal Problem:   Depression Active Problems:   Acute respiratory failure ([place])   Total Time spent with patient: 15 minutes  Vicki Morales is 78 years old Caucasian female with past psychiatric history of MDD, anxiety disorder, restless leg syndrome and medical history of COPD and CHF, admitted on the medical floor after she was brought to the ER with shortness of breath secondary to COPD and CHF.  Psychiatry was [place] consulted for further evaluation of depression and anxiety and medication recommendations.  Patient reassessed at bedside as a follow-up today.  She is alert and oriented x3.  Patient reports feeling better today.  She reports sleeping pretty well and denies any issues with appetite.  She denies any suicidal/homicidal ideations, intents or plans.  Denies any auditory/visual hallucinations or other symptoms of psychosis.  Reportedly patient has been confused this a.m., however during my assessment with her nurse present in the room patient is alert and oriented x3 and does not exhibit any manic or psychotic symptoms.   Past Medical History:  Past Medical History:  Diagnosis Date  . Anxiety   . Arthritis   . Cataract    right eye  . COPD (chronic obstructive pulmonary disease) ([place])   . Depression   . Dyspnea    DOE  . Dysrhythmia   . Edema    FEET/ANKLES  . GERD (gastroesophageal reflux disease)   . H/O wheezing   . History of hiatal hernia   . HOH (hard of hearing)   . Hypertension   . Incontinence of urine in female   . Orthopnea   . Pain    CHRONIC KNEE  . Pain    CHRONIC KNEE  . Paranoid disorder ([place])   . RLS (restless legs syndrome)   . Tremors of nervous system    HANDS    Past Surgical History:  Procedure Laterality Date  . ABDOMINAL  HYSTERECTOMY  1988  . APPENDECTOMY  1988  . CARDIAC CATHETERIZATION    . CATARACT EXTRACTION W/PHACO Right 07/14/2016   Procedure: CATARACT EXTRACTION PHACO AND INTRAOCULAR LENS PLACEMENT ([place]) anterior vitrectomy;  Surgeon: Estill Cotta, MD;  Location: [facility] ORS;  Service: Ophthalmology;  Laterality: Right;  [place] 02:40 AP% 24.4 CDE 59.98 Fluid pack # [id number]  . CATARACT EXTRACTION W/PHACO Left 02/16/2017   Procedure: CATARACT EXTRACTION PHACO AND INTRAOCULAR LENS PLACEMENT (IOC);  Surgeon: Estill Cotta, MD;  Location: [facility] ORS;  Service: Ophthalmology;  Laterality: Left;  Lot # [id number] H [place]: 01:11.8 AP%: 23.5 CDE: 32.09   . ESOPHAGOGASTRODUODENOSCOPY N/A 11/11/2014   Procedure: ESOPHAGOGASTRODUODENOSCOPY (EGD);  Surgeon: Josefine Class, MD;  [facility];  Service: Endoscopy;  Laterality: N/A;  . TONSILLECTOMY AND ADENOIDECTOMY    . TRIGGER FINGER RELEASE  2004   Family History:  Family History  Problem Relation Age of Onset  . Breast cancer Sister   . Stroke Father   . Emphysema Father   . Anxiety disorder Father   . Depression Father   . Anxiety disorder Brother    Family Psychiatric  History: See initial consult note Social History:  Social History   Substance and Sexual Activity  Alcohol Use No  . Alcohol/week: 0.0 standard drinks     Social History   Substance and Sexual Activity  Drug Use No    Social History   Socioeconomic  History  . Marital status: Widowed    Spouse name: Not on file  . Number of children: 2  . Years of education: Not on file  . Highest education level: 12th grade  Occupational History  . 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 Financial resource strain: Not hard at all  . Food insecurity:    Worry: Never true    Inability: Never true  . Transportation needs:    Medical: No    Non-medical: No  Tobacco Use  . Smoking status: Former Smoker    Years: 30.00  . Smokeless tobacco: Never Used  . Tobacco  comment: quit in 1989  Substance and Sexual Activity  . Alcohol use: No    Alcohol/week: 0.0 standard drinks  . Drug use: No  . Sexual activity: Not Currently  Lifestyle  . Physical activity:    Days per week: 0 days    Minutes per session: 0 min  . Stress: Only a little  Relationships  . Social connections:    Talks on phone: Not on file    Gets together: Not on file    Attends religious service: Not on file    Active member of club or organization: Not on file    Attends meetings of clubs or organizations: Not on file    Relationship status: Not on file  Other Topics Concern  . Not on file  Social History Narrative   Lives at home with her daughter.  Ambulates with a walker at baseline   Additional Social History: See his initial consult note    Allergies:   Allergies  Allergen Reactions  . Arthrotec [Diclofenac-Misoprostol] Hives  . Codeine Other (See Comments)    Headache   . Diclofenac Other (See Comments) and Hives    Pt unsure allergy  . Hydrochlorothiazide Other (See Comments)    Weakness   . Penicillins Hives    Has patient had a PCN reaction causing immediate rash, facial/tongue/throat swelling, SOB or lightheadedness with hypotension: No Has patient had a PCN reaction causing severe rash involving mucus membranes or skin necrosis: No Has patient had a PCN reaction that required hospitalization: No Has patient had a PCN reaction occurring within the last 10 years: No If all of the above answers are "NO", then may proceed with Cephalosporin use.  . Sulfa Antibiotics Hives  . Tiotropium Bromide Monohydrate Other (See Comments)    Blurry vision   . Tylenol [Acetaminophen] Other (See Comments)    Doesn't work  . Morphine Hives and Rash  . Pantoprazole Rash    Labs:  Results for orders placed or performed during the hospital encounter of 06/09/18 (from the past 48 hour(s))  Urinalysis, Complete w Microscopic     Status: Abnormal   Collection Time: 06/16/18   4:18 PM  Result Value Ref Range   Color, Urine STRAW (A) YELLOW   APPearance CLEAR (A) CLEAR   Specific Gravity, Urine 1.006 1.005 - 1.030   pH 6.0 5.0 - 8.0   Glucose, UA NEGATIVE NEGATIVE mg/dL   Hgb urine dipstick NEGATIVE NEGATIVE   Bilirubin Urine NEGATIVE NEGATIVE   Ketones, ur NEGATIVE NEGATIVE mg/dL   Protein, ur NEGATIVE NEGATIVE mg/dL   Nitrite NEGATIVE NEGATIVE   Leukocytes, UA NEGATIVE NEGATIVE   WBC, UA NONE SEEN 0 - 5 WBC/hpf   Bacteria, UA NONE SEEN NONE SEEN   Squamous Epithelial / LPF NONE SEEN 0 - 5   Mucus PRESENT  Comment: Performed at [facility], [address] [place], [place] [address]  Glucose, capillary     Status: Abnormal   Collection Time: 06/16/18  4:46 PM  Result Value Ref Range   Glucose-Capillary 134 (H) 70 - 99 mg/dL  Glucose, capillary     Status: Abnormal   Collection Time: 06/16/18  8:55 PM  Result Value Ref Range   Glucose-Capillary 138 (H) 70 - 99 mg/dL  Basic metabolic panel     Status: Abnormal   Collection Time: 06/17/18  7:15 AM  Result Value Ref Range   Sodium 131 (L) 135 - 145 mmol/L   Potassium 4.1 3.5 - 5.1 mmol/L   Chloride 81 (L) 98 - 111 mmol/L   CO2 42 (H) 22 - 32 mmol/L   Glucose, Bld 109 (H) 70 - 99 mg/dL   BUN 46 (H) 8 - 23 mg/dL   Creatinine, Ser 0.97 0.44 - 1.00 mg/dL   Calcium 8.6 (L) 8.9 - 10.3 mg/dL   GFR calc non Af Amer 56 (L) >60 mL/min   GFR calc Af Amer >60 >60 mL/min   Anion gap 8 5 - 15    Comment: Performed at [facility], [address] [place], [place] [address]  Glucose, capillary     Status: None   Collection Time: 06/17/18  7:33 AM  Result Value Ref Range   Glucose-Capillary 93 70 - 99 mg/dL  Glucose, capillary     Status: None   Collection Time: 06/17/18 11:48 AM  Result Value Ref Range   Glucose-Capillary 77 70 - 99 mg/dL  Glucose, capillary     Status: Abnormal   Collection Time: 06/17/18  4:39 PM  Result Value Ref Range   Glucose-Capillary 139 (H) 70 - 99 mg/dL   Glucose, capillary     Status: Abnormal   Collection Time: 06/17/18  9:09 PM  Result Value Ref Range   Glucose-Capillary 119 (H) 70 - 99 mg/dL   Comment 1 Notify RN   Glucose, capillary     Status: None   Collection Time: 06/18/18  7:34 AM  Result Value Ref Range   Glucose-Capillary 89 70 - 99 mg/dL  Glucose, capillary     Status: None   Collection Time: 06/18/18 11:38 AM  Result Value Ref Range   Glucose-Capillary 96 70 - 99 mg/dL    Current Facility-Administered Medications  Medication Dose Route Frequency Provider Last Rate Last Dose  . 0.9 %  sodium chloride infusion  250 mL Intravenous PRN Pyreddy, Reatha Harps, MD      . acidophilus (RISAQUAD) capsule 1 capsule  1 capsule Oral TID WC Pyreddy, Reatha Harps, MD   1 capsule at 06/18/18 1050  . albuterol (PROVENTIL) (2.5 MG/3ML) 0.083% nebulizer solution 2.5 mg  2.5 mg Nebulization Q6H Arta Silence, MD   2.5 mg at 06/18/18 0824  . albuterol (PROVENTIL) (2.5 MG/3ML) 0.083% nebulizer solution 2.5 mg  2.5 mg Nebulization Q4H PRN Arta Silence, MD   2.5 mg at 06/14/18 0301  . amiodarone (PACERONE) tablet 200 mg  200 mg Oral BID Saundra Shelling, MD   200 mg at 06/18/18 1055  . apixaban (ELIQUIS) tablet 5 mg  5 mg Oral BID Saundra Shelling, MD   5 mg at 06/18/18 1050  . aspirin EC tablet 81 mg  81 mg Oral Daily Pyreddy, Reatha Harps, MD   81 mg at 06/18/18 1050  . benzonatate (TESSALON) capsule 200 mg  200 mg Oral TID PRN Mayo, Pete Pelt, MD      .  busPIRone (BUSPAR) tablet 15 mg  15 mg Oral TID Saundra Shelling, MD   15 mg at 06/18/18 1058  . calcium-vitamin D (OSCAL WITH D) 500-200 MG-UNIT per tablet 1 tablet  1 tablet Oral BID Saundra Shelling, MD   0.5 tablet at 06/18/18 1058  . chlorhexidine (PERIDEX) 0.12 % solution 15 mL  15 mL Mouth Rinse BID Flora Lipps, MD   15 mL at 06/18/18 1056  . clonazePAM (KLONOPIN) disintegrating tablet 0.25 mg  0.25 mg Oral BID Epifanio Lesches, MD      . digoxin (LANOXIN) tablet 0.0625 mg  0.0625 mg Oral Daily  Pyreddy, Pavan, MD   0.0625 mg at 06/18/18 1056  . divalproex (DEPAKOTE ER) 24 hr tablet 250 mg  250 mg Oral BID Saundra Shelling, MD   250 mg at 06/18/18 1058  . feeding supplement (ENSURE ENLIVE) (ENSURE ENLIVE) liquid 237 mL  237 mL Oral BID BM Pyreddy, Pavan, MD   237 mL at 06/17/18 1400  . furosemide (LASIX) injection 40 mg  40 mg Intravenous BID Epifanio Lesches, MD   40 mg at 06/18/18 1050  . gabapentin (NEURONTIN) capsule 100 mg  100 mg Oral BID WC Pyreddy, Pavan, MD   100 mg at 06/18/18 1050  . guaiFENesin (MUCINEX) 12 hr tablet 600 mg  600 mg Oral BID Saundra Shelling, MD   600 mg at 06/18/18 1105  . guaiFENesin-dextromethorphan (ROBITUSSIN DM) 100-10 MG/5ML syrup 5 mL  5 mL Oral Q4H PRN Saundra Shelling, MD   5 mL at 06/13/18 2005  . hydrALAZINE (APRESOLINE) injection 10-20 mg  10-20 mg Intravenous Q4H PRN Awilda Bill, NP   10 mg at 06/11/18 0053  . hydrALAZINE (APRESOLINE) tablet 25 mg  25 mg Oral BID Awilda Bill, NP   25 mg at 06/18/18 1054  . hydrocortisone (ANUSOL-HC) 2.5 % rectal cream 1 application  1 application Topical QID PRN Epifanio Lesches, MD      . insulin aspart (novoLOG) injection 0-15 Units  0-15 Units Subcutaneous TID WC Saundra Shelling, MD   2 Units at 06/17/18 1736  . insulin aspart (novoLOG) injection 0-5 Units  0-5 Units Subcutaneous QHS Pyreddy, Pavan, MD      . loperamide (IMODIUM) capsule 2 mg  2 mg Oral PRN Pyreddy, Reatha Harps, MD      . meclizine (ANTIVERT) tablet 12.5 mg  12.5 mg Oral BID PRN Saundra Shelling, MD      . MEDLINE mouth rinse  15 mL Mouth Rinse q12n4p Flora Lipps, MD   15 mL at 06/17/18 1551  . Melatonin TABS 10 mg  10 mg Oral QHS Saundra Shelling, MD   10 mg at 06/17/18 2315  . menthol-cetylpyridinium (CEPACOL) lozenge 3 mg  1 lozenge Oral PRN Mayo, Pete Pelt, MD      . metoprolol tartrate (LOPRESSOR) injection 5 mg  5 mg Intravenous Q6H PRN Conforti, John, DO   5 mg at 06/10/18 1754  . multivitamin with minerals tablet 1 tablet  1 tablet  Oral Daily Pyreddy, Pavan, MD   0.5 tablet at 06/18/18 1058  . omega-3 acid ethyl esters (LOVAZA) capsule 1 g  1 g Oral Daily Pyreddy, Pavan, MD   1 g at 06/17/18 0857  . ondansetron (ZOFRAN) tablet 4 mg  4 mg Oral Q6H PRN Pyreddy, Reatha Harps, MD       Or  . ondansetron (ZOFRAN) injection 4 mg  4 mg Intravenous Q6H PRN Pyreddy, Pavan, MD      . oxybutynin (DITROPAN-XL) 24 hr  tablet 10 mg  10 mg Oral QHS Saundra Shelling, MD   10 mg at 06/17/18 2316  . polyethylene glycol (MIRALAX / GLYCOLAX) packet 17 g  17 g Oral BID Sela Hua, MD   17 g at 06/18/18 1047  . protein supplement (PREMIER PROTEIN) liquid - approved for s/p bariatric surgery  11 oz Oral BID BM Saundra Shelling, MD   11 oz at 06/13/18 0953  . risperiDONE (RISPERDAL) tablet 1 mg  1 mg Oral QHS Pyreddy, Reatha Harps, MD   1 mg at 06/17/18 2315  . senna-docusate (Senokot-S) tablet 1 tablet  1 tablet Oral QHS PRN Pyreddy, Reatha Harps, MD      . sodium chloride flush (NS) 0.9 % injection 3 mL  3 mL Intravenous Q12H Pyreddy, Pavan, MD   3 mL at 06/18/18 1105  . sodium chloride flush (NS) 0.9 % injection 3 mL  3 mL Intravenous PRN Pyreddy, Reatha Harps, MD      . traZODone (DESYREL) tablet 25 mg  25 mg Oral QHS PRN Awilda Bill, NP   25 mg at 06/11/18 0235  . vitamin B-12 (CYANOCOBALAMIN) tablet 1,000 mcg  1,000 mcg Oral Daily Pyreddy, Reatha Harps, MD   1,000 mcg at 06/18/18 1054  . vitamin C (ASCORBIC ACID) tablet 250 mg  250 mg Oral BID Saundra Shelling, MD   250 mg at 06/18/18 1050    Musculoskeletal: Strength & Muscle Tone: within normal limits Gait & Station: not formely assesed Patient leans: N/A  Psychiatric Specialty Exam: Physical Exam  Nursing note and vitals reviewed.   ROS- reports feeling better today  Blood pressure 131/60, pulse 67, temperature 98.1 F (36.7 C), temperature source Oral, resp. rate 20, height 5\' 5"  (1.651 m), weight 109.2 kg, SpO2 91 %.Body mass index is 40.06 kg/m.  General Appearance: Casual  Eye Contact:  Fair  Speech:   Normal Rate  Volume:  Normal  Mood:  "I feel better today"  Affect:  Appropriate  Thought Process:  Coherent  Orientation:  Full (Time, Place, and Person)  Thought Content:  Logical  Suicidal Thoughts:  No  Homicidal Thoughts:  No  Memory:  NA  Judgement:  Fair  Insight:  Fair  Psychomotor Activity:  Normal  Concentration:  Concentration: Fair  Recall:  [employer] of Knowledge:  Fair  Language:  Fair  Akathisia:  No  Handed:  Right  AIMS (if indicated):     Assets:  Communication Skills  ADL's:  Intact  Cognition:  WNL  Sleep:        Treatment Plan Summary: Ms. Vavrek is 78 years old Caucasian female with past psychiatric history of MDD, anxiety disorder, restless leg syndrome and medical history of COPD and CHF, admitted on the medical floor after she was brought to the ER with shortness of breath secondary to COPD and CHF.  MDD, recurrent, severe generalized anxiety disorder -Started on Zoloft 50 mg p.o. daily -Klonopin 0.25 mg p.o. twice daily -Continue Risperdal and Depakote as prescribed  [place] comorbidities-OPD and CHF Continue treatment as per primary team  Disposition: Patient does not meet criteria for psychiatric inpatient admission.   Discussed the tx plan with primary team.  Whitman Hero, MD 06/18/2018 12:19 PM

## 2018-06-18 NOTE — Progress Notes (Signed)
[facility] [place] NAME: Vicki Morales    MR#:  [id number]  DATE OF BIRTH:  02/15/1941 Patient more sedated, more confused today, and Risperdal 1 mg last night, Dose of Neurontin per psychiatrist,. REVIEW OF SYSTEMS:  Review of Systems  Constitutional: Positive for malaise/fatigue. Negative for chills and fever.  HENT: Negative for congestion and sore throat.   Eyes: Negative for blurred vision and double vision.  Respiratory: Negative for cough and shortness of breath.   Cardiovascular: Negative for chest pain and leg swelling.  Gastrointestinal: Negative for nausea and vomiting.  Genitourinary: Negative for dysuria and urgency.  Musculoskeletal: Negative for back pain and neck pain.  Neurological: Negative for dizziness and headaches.  Psychiatric/Behavioral: Negative for depression. The patient is nervous/anxious.     DRUG ALLERGIES:   Allergies  Allergen Reactions  . Arthrotec [Diclofenac-Misoprostol] Hives  . Codeine Other (See Comments)    Headache   . Diclofenac Other (See Comments) and Hives    Pt unsure allergy  . Hydrochlorothiazide Other (See Comments)    Weakness   . Penicillins Hives    Has patient had a PCN reaction causing immediate rash, facial/tongue/throat swelling, SOB or lightheadedness with hypotension: No Has patient had a PCN reaction causing severe rash involving mucus membranes or skin necrosis: No Has patient had a PCN reaction that required hospitalization: No Has patient had a PCN reaction occurring within the last 10 years: No If all of the above answers are "NO", then may proceed with Cephalosporin use.  . Sulfa Antibiotics Hives  . Tiotropium Bromide Monohydrate Other (See Comments)    Blurry vision   . Tylenol [Acetaminophen] Other (See Comments)    Doesn't work  . Morphine Hives and Rash  . Pantoprazole Rash   VITALS:  Blood pressure 131/60, pulse 67, temperature 98.1 F (36.7 C),  temperature source Oral, resp. rate 20, height 5\' 5"  (1.651 m), weight 109.2 kg, SpO2 91 %. PHYSICAL EXAMINATION:  Physical Exam  GENERAL:  78 y.o.-year-old patient alert during my exam but she was confused earlier in the morning  EYES: Pupils equal, round, reactive to light and accommodation. No scleral icterus. Extraocular muscles intact.  HEENT: Head atraumatic, normocephalic. Oropharynx and nasopharynx clear.  NECK:  Supple, no jugular venous distention. No thyroid enlargement, no tenderness.  LUNGS:   patient has expiratory wheezing upper lung fields bilaterally CARDIOVASCULAR: S1, S2 normal. No murmurs, rubs, or gallops.  ABDOMEN: Soft, nontender, nondistended. Bowel sounds present. No organomegaly or mass.  EXTREMITIES: + Nonpitting bilateral lower extremity edema, no cyanosis, or clubbing.  NEUROLOGIC:  Cranial nerves II through XII grossly intact, no focal deficits, +global weakness, sensation intact PSYCHIATRIC: The patient is alert and oriented x 2 SKIN: No obvious rash, lesion, or ulcer.  LABORATORY PANEL:  Female CBC Recent Labs  Lab 06/16/18 0422  WBC 7.0  HGB 11.8*  HCT 36.3  PLT 223   ------------------------------------------------------------------------------------------------------------------ Chemistries  Recent Labs  Lab 06/17/18 0715  NA 131*  K 4.1  CL 81*  CO2 42*  GLUCOSE 109*  BUN 46*  CREATININE 0.97  CALCIUM 8.6*   RADIOLOGY:  No results found. ASSESSMENT AND PLAN:   Acute hypercapnic/hypoxemic respiratory failure- secondary to CHF and COPD exacerbation.  Continue IV steroids, bronchodilators. Acute on chronic diastolic congestive heart failure exacerbation-echo showed EF 65 to 70%, started back on Lasix, she is feeling much better today, sodium improved from 1 24-1 31.  Continue Lasix.  Acute COPD  exacerbation-less wheezing today.  Continue bronchodilators, steroids.  Scheduled bronchodilators, prednisone  AKI-better, improved.. Chronic  atrial fibrillation- rate controlled -Continue Eliquis and amiodarone Hyponatremia likely dilutional, improved with Lasix, fluid restriction.  Continue fluid restriction up to 1.2 L to 4 hours  Deconditioning, physical therapy recommends SNF. Anxiety: Xanax and start Klonopin Depression, seen by psychiatrist, recommends Zoloft, increasing the Neurontin 300 mg p.o. twice daily and continue 600 at night but patient is very sedated this morning and also confused.  So I have decreased her dose of Neurontin, will wean off Xanax and start Klonopin 0. 2 5 mg p.o. 2 times daily. He had no retention, no evidence of UTI, has been having urine retention up to 900 mL status post in and out cath, improvement, consult urology for urinary retention. all the records are reviewed and case discussed with Care Management/Social Worker. Management plans discussed with the patient, family and they are in agreement.  CODE STATUS: DNR  TOTAL TIME TAKING CARE OF THIS PATIENT: 35 minutes.   More than 50% of the time was spent in counseling/coordination of care: YES  POSSIBLE D/C tomorrow, DEPENDING ON CLINICAL CONDITION.   Epifanio Lesches M.D on 06/18/2018 at 12:04 PM  Between 7am to 6pm - Pager - [phone]  After 6pm go to [email or web address] - Proofreader  [facility]  Office  [phone]  CC: Primary care physician; Birdie Sons, MD  Note: This dictation was prepared with Dragon dictation along with smaller phrase technology. Any transcriptional errors that result from this process are unintentional.

## 2018-06-19 LAB — CBC
HEMATOCRIT: 37.1 % (ref 36.0–46.0)
Hemoglobin: 11.9 g/dL — ABNORMAL LOW (ref 12.0–15.0)
MCH: 29.1 pg (ref 26.0–34.0)
MCHC: 32.1 g/dL (ref 30.0–36.0)
MCV: 90.7 fL (ref 80.0–100.0)
Platelets: 199 10*3/uL (ref 150–400)
RBC: 4.09 MIL/uL (ref 3.87–5.11)
RDW: 14.6 % (ref 11.5–15.5)
WBC: 9.7 10*3/uL (ref 4.0–10.5)
nRBC: 0 % (ref 0.0–0.2)

## 2018-06-19 LAB — GLUCOSE, CAPILLARY
Glucose-Capillary: 80 mg/dL (ref 70–99)
Glucose-Capillary: 80 mg/dL (ref 70–99)
Glucose-Capillary: 72 mg/dL (ref 70–99)
Glucose-Capillary: 108 mg/dL — ABNORMAL HIGH (ref 70–99)

## 2018-06-19 LAB — BASIC METABOLIC PANEL
Anion gap: 8 (ref 5–15)
BUN: 45 mg/dL — ABNORMAL HIGH (ref 8–23)
CO2: 42 mmol/L — ABNORMAL HIGH (ref 22–32)
Calcium: 8.2 mg/dL — ABNORMAL LOW (ref 8.9–10.3)
Chloride: 82 mmol/L — ABNORMAL LOW (ref 98–111)
Creatinine, Ser: 0.99 mg/dL (ref 0.44–1.00)
GFR calc Af Amer: >60 mL/min (ref >60)
GFR calc non Af Amer: 54 mL/min — ABNORMAL LOW (ref >60)
Glucose, Bld: 105 mg/dL — ABNORMAL HIGH (ref 70–99)
Potassium: 3.6 mmol/L (ref 3.5–5.1)
Sodium: 132 mmol/L — ABNORMAL LOW (ref 135–145)

## 2018-06-19 NOTE — Care Management Important Message (Signed)
Important Message  Patient Details  Name: Vicki Morales MRN: [id number] Date of Birth: 11/29/1940   Medicare Important Message Given:  Yes    [name] A Rakeem Colley 06/19/2018, 11:50 AM

## 2018-06-20 DIAGNOSIS — F411 Generalized anxiety disorder: Secondary | ICD-10-CM

## 2018-06-20 DIAGNOSIS — F331 Major depressive disorder, recurrent, moderate: Secondary | ICD-10-CM

## 2018-06-20 LAB — GLUCOSE, CAPILLARY
Glucose-Capillary: 67 mg/dL — ABNORMAL LOW (ref 70–99)
Glucose-Capillary: 92 mg/dL (ref 70–99)
Glucose-Capillary: 73 mg/dL (ref 70–99)
Glucose-Capillary: 145 mg/dL — ABNORMAL HIGH (ref 70–99)

## 2018-06-20 MED ORDER — FUROSEMIDE 10 MG/ML IJ SOLN: 20.0000 mg | Freq: Two times a day (BID) | INTRAMUSCULAR | Status: DC

## 2018-06-20 MED ORDER — FUROSEMIDE 20 MG PO TABS
20.0000 mg | ORAL_TABLET | Freq: Two times a day (BID) | ORAL | Status: DC
  Administered 2018-06-20 – 2018-06-23 (×6): 20 mg via ORAL
  Filled 2018-06-20 (×7): qty 1

## 2018-06-20 MED ORDER — SERTRALINE HCL 50 MG PO TABS
50.0000 mg | ORAL_TABLET | Freq: Every day | ORAL | Status: DC
  Administered 2018-06-20 – 2018-06-23 (×4): 50 mg via ORAL
  Filled 2018-06-20 (×4): qty 1

## 2018-06-20 NOTE — Consult Note (Signed)
Urology Consult  I have been asked to see the patient by Dr. Vianne Bulls, for evaluation and management of urinary retention.  Chief Complaint: Unable to urinate  History of Present Illness: Vicki Morales is a 78 y.o. year old female admitted on 06/09/2018 for acute respiratory failure and acute on chronic congestive heart failure exacerbation.  During her hospitalization she noted voiding difficulty and was found to be in urinary retention.  She was initially managed with intermittent catheterization and a Foley catheter was placed yesterday.  She has been on extended release oxybutynin for urinary incontinence.  She relates to intermittent episodes of urinary retention over the past few years.  She presently has no complaints with Foley catheter drainage.  Past Medical History:  Diagnosis Date  . Anxiety   . Arthritis   . Cataract    right eye  . COPD (chronic obstructive pulmonary disease) ([place])   . Depression   . Dyspnea    DOE  . Dysrhythmia   . Edema    FEET/ANKLES  . GERD (gastroesophageal reflux disease)   . H/O wheezing   . History of hiatal hernia   . HOH (hard of hearing)   . Hypertension   . Incontinence of urine in female   . Orthopnea   . Pain    CHRONIC KNEE  . Pain    CHRONIC KNEE  . Paranoid disorder ([place])   . RLS (restless legs syndrome)   . Tremors of nervous system    HANDS    Past Surgical History:  Procedure Laterality Date  . ABDOMINAL HYSTERECTOMY  1988  . APPENDECTOMY  1988  . CARDIAC CATHETERIZATION    . CATARACT EXTRACTION W/PHACO Right 07/14/2016   Procedure: CATARACT EXTRACTION PHACO AND INTRAOCULAR LENS PLACEMENT ([place]) anterior vitrectomy;  Surgeon: Estill Cotta, MD;  Location: [facility] ORS;  Service: Ophthalmology;  Laterality: Right;  [place] 02:40 AP% 24.4 CDE 59.98 Fluid pack # [id number]  . CATARACT EXTRACTION W/PHACO Left 02/16/2017   Procedure: CATARACT EXTRACTION PHACO AND INTRAOCULAR LENS PLACEMENT (IOC);  Surgeon: Estill Cotta,  MD;  Location: [facility] ORS;  Service: Ophthalmology;  Laterality: Left;  Lot # [id number] H [place]: 01:11.8 AP%: 23.5 CDE: 32.09   . ESOPHAGOGASTRODUODENOSCOPY N/A 11/11/2014   Procedure: ESOPHAGOGASTRODUODENOSCOPY (EGD);  Surgeon: Josefine Class, MD;  Location: [facility] ENDOSCOPY;  Service: Endoscopy;  Laterality: N/A;  . TONSILLECTOMY AND ADENOIDECTOMY    . TRIGGER FINGER RELEASE  2004    Home Medications:  Current Meds  Medication Sig  . acetaminophen (TYLENOL) 325 MG tablet Take 650 mg by mouth every 4 (four) hours as needed for mild pain or moderate pain.  [name] Kitchen acidophilus (RISAQUAD) CAPS capsule Take 1 capsule by mouth 3 (three) times daily with meals.  [name] Kitchen albuterol (PROVENTIL) (2.5 MG/3ML) 0.083% nebulizer solution Take 3 mLs (2.5 mg total) by nebulization 2 (two) times daily as needed for wheezing or shortness of breath.  [name] Kitchen amiodarone (PACERONE) 200 MG tablet Take 1 tablet (200 mg total) by mouth 2 (two) times daily.  [name] Kitchen apixaban (ELIQUIS) 5 MG TABS tablet Take 1 tablet (5 mg total) by mouth 2 (two) times daily.  [name] Kitchen aspirin EC 81 MG EC tablet Take 1 tablet (81 mg total) by mouth daily.  . busPIRone (BUSPAR) 10 MG tablet Take 1 tablet (10 mg total) by mouth 3 (three) times daily. (Patient taking differently: Take 15 mg by mouth 3 (three) times daily. )  . Calcium Carbonate-Vitamin D (CALCIUM 600+D) 600-400 MG-UNIT tablet Take 1  tablet by mouth 2 (two) times daily.   . Cranberry 250 MG CAPS Take 1 capsule by mouth daily.   . cyanocobalamin 1000 MCG tablet Take 1,000 mcg by mouth daily.  . digoxin (LANOXIN) 0.125 MG tablet Take 0.5 tablets (0.0625 mg total) by mouth daily.  . divalproex (DEPAKOTE ER) 250 MG 24 hr tablet Take 250 mg by mouth 2 (two) times daily.  . feeding supplement, ENSURE ENLIVE, (ENSURE ENLIVE) LIQD Take 237 mLs by mouth 2 (two) times daily between meals.  . fluticasone (FLONASE) 50 MCG/ACT nasal spray Place 1 spray into both nostrils daily.  . Fluticasone-Umeclidin-Vilant (TRELEGY  ELLIPTA) 100-62.5-25 MCG/INH AEPB Inhale 1 puff into the lungs daily.  . furosemide (LASIX) 40 MG tablet Take 40 mg by mouth daily.  [name] Kitchen gabapentin (NEURONTIN) 100 MG capsule Take 1 capsule (100 mg total) by mouth 2 (two) times daily with a meal.  . gabapentin (NEURONTIN) 600 MG tablet Take 1 tablet (600 mg total) by mouth at bedtime.  [name] Kitchen loperamide (IMODIUM) 2 MG capsule Take 1 capsule (2 mg total) by mouth as needed for diarrhea or loose stools.  . magnesium oxide (MAG-OX) 400 (241.3 Mg) MG tablet Take 1 tablet (400 mg total) by mouth 2 (two) times daily.  . meclizine (ANTIVERT) 12.5 MG tablet Take 12.5 mg by mouth 2 (two) times daily as needed for dizziness.  . Melatonin 10 MG CAPS Take 10 mg by mouth at bedtime.   . midodrine (PROAMATINE) 10 MG tablet Take 1 tablet (10 mg total) by mouth 3 (three) times daily with meals.  . montelukast (SINGULAIR) 10 MG tablet TAKE 1 TABLET(10 MG) BY MOUTH EVERY NIGHT (Patient taking differently: Take 10 mg by mouth at bedtime. )  . Multiple Vitamin (MULTIVITAMIN WITH MINERALS) TABS tablet Take 1 tablet by mouth daily.  [name] Kitchen omega-3 acid ethyl esters (LOVAZA) 1 g capsule Take 1 capsule (1 g total) by mouth daily.  [name] Kitchen oxybutynin (DITROPAN-XL) 10 MG 24 hr tablet TAKE 1 TABLET BY MOUTH AT BEDTIME (Patient taking differently: Take 10 mg by mouth at bedtime. )  . protein supplement shake (PREMIER PROTEIN) LIQD Take 325 mLs (11 oz total) by mouth 2 (two) times daily between meals.  . risperiDONE (RISPERDAL) 1 MG tablet Take 1 tablet (1 mg total) by mouth at bedtime.  . Saccharomyces boulardii (PROBIOTIC) 250 MG CAPS Take 1 capsule by mouth 3 times/day as needed-between meals & bedtime. (Patient taking differently: Take 1 capsule by mouth 4 (four) times daily -  with meals and at bedtime. )  . theophylline (THEO-24) 200 MG 24 hr capsule Take 1 capsule (200 mg total) by mouth daily.    Allergies:  Allergies  Allergen Reactions  . Arthrotec [Diclofenac-Misoprostol] Hives    . Codeine Other (See Comments)    Headache   . Diclofenac Other (See Comments) and Hives    Pt unsure allergy  . Hydrochlorothiazide Other (See Comments)    Weakness   . Penicillins Hives    Has patient had a PCN reaction causing immediate rash, facial/tongue/throat swelling, SOB or lightheadedness with hypotension: No Has patient had a PCN reaction causing severe rash involving mucus membranes or skin necrosis: No Has patient had a PCN reaction that required hospitalization: No Has patient had a PCN reaction occurring within the last 10 years: No If all of the above answers are "NO", then may proceed with Cephalosporin use.  . Sulfa Antibiotics Hives  . Tiotropium Bromide Monohydrate Other (See Comments)    Blurry  vision   . Tylenol [Acetaminophen] Other (See Comments)    Doesn't work  . Morphine Hives and Rash  . Pantoprazole Rash    Family History  Problem Relation Age of Onset  . Breast cancer Sister   . Stroke Father   . Emphysema Father   . Anxiety disorder Father   . Depression Father   . Anxiety disorder Brother     Social History:  reports that she has quit smoking. She quit after 30.00 years of use. She has never used smokeless tobacco. She reports that she does not drink alcohol or use drugs.  ROS: A complete review of systems was performed.  All systems are negative except for pertinent findings as noted.  Physical Exam:  Vital signs in last 24 hours: Temp:  [97.5 F (36.4 C)-98.5 F (36.9 C)] 98.3 F (36.8 C) (01/14 0501) Pulse Rate:  [53-104] 53 (01/14 0547) Resp:  [16-19] 19 (01/14 0501) BP: (93-130)/(41-56) 123/47 (01/14 0547) SpO2:  [90 %-95 %] 90 % (01/14 0501) Weight:  [110.3 kg] 110.3 kg (01/14 0501) Constitutional:  Alert, No acute distress HEENT: [place] AT, moist mucus membranes.  Trachea midline, no masses Respiratory: Normal respiratory effort GI: Abdomen is soft, nontender, nondistended, no abdominal masses   Impression/Assessment:   78 year old female with urinary retention of undetermined etiology.  Plan:  1.  Leave Foley catheter indwelling for 7-10 days. 2.  Discontinue oxybutynin 3.  Voiding trial 7 to 10 days which can be performed in our office or at her SNF.  06/20/2018, 7:22 AM  John Giovanni,  MD

## 2018-06-20 NOTE — Progress Notes (Signed)
[name] NAME: Vicki Morales    MR#:  [id number]  DATE OF BIRTH:   Says that she does not feel good, having soft blood pressure, slight bradycardia. REVIEW OF SYSTEMS:  Review of Systems  Constitutional: Positive for malaise/fatigue. Negative for chills and fever.  HENT: Negative for congestion and sore throat.   Eyes: Negative for blurred vision and double vision.  Respiratory: Negative for cough and shortness of breath.   Cardiovascular: Negative for chest pain and leg swelling.  Gastrointestinal: Negative for nausea and vomiting.  Genitourinary: Negative for dysuria and urgency.  Musculoskeletal: Negative for back pain and neck pain.  Neurological: Negative for dizziness and headaches.  Psychiatric/Behavioral: Negative for depression. The patient is nervous/anxious.     DRUG ALLERGIES:   Allergies  Allergen Reactions  . Arthrotec [Diclofenac-Misoprostol] Hives  . Codeine Other (See Comments)    Headache   . Diclofenac Other (See Comments) and Hives    Pt unsure allergy  . Hydrochlorothiazide Other (See Comments)    Weakness   . Penicillins Hives    Has patient had a PCN reaction causing immediate rash, facial/tongue/throat swelling, SOB or lightheadedness with hypotension: No Has patient had a PCN reaction causing severe rash involving mucus membranes or skin necrosis: No Has patient had a PCN reaction that required hospitalization: No Has patient had a PCN reaction occurring within the last 10 years: No If all of the above answers are "NO", then may proceed with Cephalosporin use.  . Sulfa Antibiotics Hives  . Tiotropium Bromide Monohydrate Other (See Comments)    Blurry vision   . Tylenol [Acetaminophen] Other (See Comments)    Doesn't work  . Morphine Hives and Rash  . Pantoprazole Rash   VITALS:  Blood pressure (!) 108/39, pulse 74, temperature 98.4 F (36.9 C), temperature source Oral, resp. rate 17,  height 5\' 5"  (1.651 m), weight 110.3 kg, SpO2 91 %. PHYSICAL EXAMINATION:  Physical Exam  GENERAL:  78 y.o.-year-old patient alert during my exam but she was confused earlier in the morning  EYES: Pupils equal, round, reactive to light and accommodation. No scleral icterus. Extraocular muscles intact.  HEENT: Head atraumatic, normocephalic. Oropharynx and nasopharynx clear.  NECK:  Supple, no jugular venous distention. No thyroid enlargement, no tenderness.  LUNGS:   patient has expiratory wheezing upper lung fields bilaterally CARDIOVASCULAR: S1, S2 normal. No murmurs, rubs, or gallops.  ABDOMEN: Soft, nontender, nondistended. Bowel sounds present. No organomegaly or mass.  EXTREMITIES: + Nonpitting bilateral lower extremity edema, no cyanosis, or clubbing.  NEUROLOGIC:  Cranial nerves II through XII grossly intact, no focal deficits, +global weakness, sensation intact PSYCHIATRIC: The patient is alert and oriented x 2 SKIN: No obvious rash, lesion, or ulcer.  LABORATORY PANEL:  Female CBC Recent Labs  Lab 06/19/18 0429  WBC 9.7  HGB 11.9*  HCT 37.1  PLT 199   ------------------------------------------------------------------------------------------------------------------ Chemistries  Recent Labs  Lab 06/19/18 0429  NA 132*  K 3.6  CL 82*  CO2 42*  GLUCOSE 105*  BUN 45*  CREATININE 0.99  CALCIUM 8.2*   RADIOLOGY:  No results found. ASSESSMENT AND PLAN:   Acute hypercapnic/hypoxemic respiratory failure- secondary to CHF and COPD exacerbation.  Continue IV steroids, bronchodilators. Acute on chronic diastolic congestive heart failure exacerbation-echo showed EF 65 to 70%, decrease the dose of IV Lasix as blood pressure is soft.  Continue fluid restriction up to 1.2 L.  /24 hours. Acute COPD exacerbation-less  wheezing today.  Continue bronchodilators, steroids. Scheduled bronchodilators, prednisone  AKI-better, improved.\ . Chronic atrial fibrillation- rate  controlled -Continue Eliquis and amiodarone  Hyponatremia likely dilutional, improved with Lasix, fluid restriction.  Continue fluid restriction up to 1.2 L to 4 hours, adjusted the dose of Lasix today.  Deconditioning, physical therapy recommends SNF.  Back to [facility] hopefully tomorrow.  Anxiety: Xanax and start Klonopin Depression, seen by psychiatrist, medications are adjusted. Urine retention, continue Foley catheter,urology consult appreciated, spoke with Dr. Bernardo Heater.  Patient seen by Dr. Bernardo Heater today.  likely discharge tomorrow. all the records are reviewed and case discussed with Care Management/Social Worker. Management plans discussed with the patient, family and they are in agreement.  CODE STATUS: DNR  TOTAL TIME TAKING CARE OF THIS PATIENT: 35 minutes.   More than 50% of the time was spent in counseling/coordination of care: YES  POSSIBLE D/C tomorrow, DEPENDING ON CLINICAL CONDITION.   Epifanio Lesches M.D on 06/20/2018 at 11:11 AM  Between 7am to 6pm - Pager - [phone]  After 6pm go to [email or web address] - Proofreader  [facility]  Office  [phone]  CC: Primary care physician; Birdie Sons, MD  Note: This dictation was prepared with Dragon dictation along with smaller phrase technology. Any transcriptional errors that result from this process are unintentional.

## 2018-06-20 NOTE — Clinical Social Work Note (Signed)
Patient is not medically ready for discharge yet per MD. Patient will discharge to [facility] when medically stable. CSW will continue to follow for discharge planning.   [place], [place]

## 2018-06-20 NOTE — Consult Note (Signed)
[facility] Face-to-Face Psychiatry Consult   Reason for Consult:  Depression Referring Physician:  Dr. Vianne Bulls Patient Identification: Vicki Morales MRN:  [id number] Principal Diagnosis: Depression Diagnosis:  Principal Problem:   Depression Active Problems:   Acute respiratory failure ([place])   Total Time spent with patient: 30 minutes  Vicki Morales is 78 years old Caucasian female with past psychiatric history of MDD, anxiety disorder, restless leg syndrome and medical history of COPD and CHF, admitted on the medical floor after she was brought to the ER with shortness of breath secondary to COPD and CHF.  Psychiatry was [place] consulted for further evaluation of depression and anxiety and medication recommendations.  Patient reassessed at bedside as a follow-up today.  She is alert and oriented x3.  Patient reports that she has been feeling short of breath and increasingly anxious.  She states that her daughter is her Scientist, research (medical).  Patient states that she has not been sleeping well and eating well.  She feels "very tired."  She does specifically deny any suicidal or homicidal ideation, intent or plan.  She is denying any auditory and visual hallucinations.  Patient states that she is tolerating her medications, but does not wish to discuss them, and asks for them to be clarified with her daughter.  She is not requesting further medication for depression or anxiety at this time.  Nursing staff is at bedside.  They report no behavior problems, no concerns of panic attacks or worsening depression at this time.  They report patient has been resting well.  Past Medical History:  Past Medical History:  Diagnosis Date  . Anxiety   . Arthritis   . Cataract    right eye  . COPD (chronic obstructive pulmonary disease) ([place])   . Depression   . Dyspnea    DOE  . Dysrhythmia   . Edema    FEET/ANKLES  . GERD (gastroesophageal reflux disease)   . H/O wheezing   . History of hiatal hernia    . HOH (hard of hearing)   . Hypertension   . Incontinence of urine in female   . Orthopnea   . Pain    CHRONIC KNEE  . Pain    CHRONIC KNEE  . Paranoid disorder ([place])   . RLS (restless legs syndrome)   . Tremors of nervous system    HANDS    Past Surgical History:  Procedure Laterality Date  . ABDOMINAL HYSTERECTOMY  1988  . APPENDECTOMY  1988  . CARDIAC CATHETERIZATION    . CATARACT EXTRACTION W/PHACO Right 07/14/2016   Procedure: CATARACT EXTRACTION PHACO AND INTRAOCULAR LENS PLACEMENT ([place]) anterior vitrectomy;  Surgeon: Estill Cotta, MD;  Location: [facility] ORS;  Service: Ophthalmology;  Laterality: Right;  [place] 02:40 AP% 24.4 CDE 59.98 Fluid pack # [id number]  . CATARACT EXTRACTION W/PHACO Left 02/16/2017   Procedure: CATARACT EXTRACTION PHACO AND INTRAOCULAR LENS PLACEMENT (IOC);  Surgeon: Estill Cotta, MD;  Location: [facility] ORS;  Service: Ophthalmology;  Laterality: Left;  Lot # [id number] H [place]: 01:11.8 AP%: 23.5 CDE: 32.09   . ESOPHAGOGASTRODUODENOSCOPY N/A 11/11/2014   Procedure: ESOPHAGOGASTRODUODENOSCOPY (EGD);  Surgeon: Josefine Class, MD;  Location: [facility] ENDOSCOPY;  Service: Endoscopy;  Laterality: N/A;  . TONSILLECTOMY AND ADENOIDECTOMY    . TRIGGER FINGER RELEASE  2004   Family History:  Family History  Problem Relation Age of Onset  . Breast cancer Sister   . Stroke Father   . Emphysema Father   . Anxiety disorder Father   .  Depression Father   . Anxiety disorder Brother    Family Psychiatric  History: See initial consult note  Social History:  Social History   Substance and Sexual Activity  Alcohol Use No  . Alcohol/week: 0.0 standard drinks     Social History   Substance and Sexual Activity  Drug Use No    Social History   Socioeconomic History  . Marital status: Widowed    Spouse name: Not on file  . Number of children: 2  . Years of education: Not on file  . Highest education level: 12th grade  Occupational History  . 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 Financial resource strain: Not hard at all  . Food insecurity:    Worry: Never true    Inability: Never true  . Transportation needs:    Medical: No    Non-medical: No  Tobacco Use  . Smoking status: Former Smoker    Years: 30.00  . Smokeless tobacco: Never Used  . Tobacco comment: quit in 1989  Substance and Sexual Activity  . Alcohol use: No    Alcohol/week: 0.0 standard drinks  . Drug use: No  . Sexual activity: Not Currently  Lifestyle  . Physical activity:    Days per week: 0 days    Minutes per session: 0 min  . Stress: Only a little  Relationships  . Social connections:    Talks on phone: Not on file    Gets together: Not on file    Attends religious service: Not on file    Active member of club or organization: Not on file    Attends meetings of clubs or organizations: Not on file    Relationship status: Not on file  Other Topics Concern  . Not on file  Social History Narrative   Lives at home with her daughter.  Ambulates with a walker at baseline   Additional Social History: See his initial consult note    Allergies:   Allergies  Allergen Reactions  . Arthrotec [Diclofenac-Misoprostol] Hives  . Codeine Other (See Comments)    Headache   . Diclofenac Other (See Comments) and Hives    Pt unsure allergy  . Hydrochlorothiazide Other (See Comments)    Weakness   . Penicillins Hives    Has patient had a PCN reaction causing immediate rash, facial/tongue/throat swelling, SOB or lightheadedness with hypotension: No Has patient had a PCN reaction causing severe rash involving mucus membranes or skin necrosis: No Has patient had a PCN reaction that required hospitalization: No Has patient had a PCN reaction occurring within the last 10 years: No If all of the above answers are "NO", then may proceed with Cephalosporin use.  . Sulfa Antibiotics Hives  . Tiotropium Bromide Monohydrate Other (See Comments)    Blurry vision    . Tylenol [Acetaminophen] Other (See Comments)    Doesn't work  . Morphine Hives and Rash  . Pantoprazole Rash    Labs:  Results for orders placed or performed during the hospital encounter of 06/09/18 (from the past 48 hour(s))  Glucose, capillary     Status: None   Collection Time: 06/18/18 11:38 AM  Result Value Ref Range   Glucose-Capillary 96 70 - 99 mg/dL  Glucose, capillary     Status: Abnormal   Collection Time: 06/18/18  4:35 PM  Result Value Ref Range   Glucose-Capillary 123 (H) 70 - 99 mg/dL  Glucose, capillary     Status: Abnormal  Collection Time: 06/18/18  9:25 PM  Result Value Ref Range   Glucose-Capillary 120 (H) 70 - 99 mg/dL  Basic metabolic panel     Status: Abnormal   Collection Time: 06/19/18  4:29 AM  Result Value Ref Range   Sodium 132 (L) 135 - 145 mmol/L   Potassium 3.6 3.5 - 5.1 mmol/L   Chloride 82 (L) 98 - 111 mmol/L   CO2 42 (H) 22 - 32 mmol/L   Glucose, Bld 105 (H) 70 - 99 mg/dL   BUN 45 (H) 8 - 23 mg/dL   Creatinine, Ser 0.99 0.44 - 1.00 mg/dL   Calcium 8.2 (L) 8.9 - 10.3 mg/dL   GFR calc non Af Amer 54 (L) >60 mL/min   GFR calc Af Amer >60 >60 mL/min   Anion gap 8 5 - 15    Comment: Performed at [facility], [address] [place], [place] [address]  CBC     Status: Abnormal   Collection Time: 06/19/18  4:29 AM  Result Value Ref Range   WBC 9.7 4.0 - 10.5 K/uL   RBC 4.09 3.87 - 5.11 MIL/uL   Hemoglobin 11.9 (L) 12.0 - 15.0 g/dL   HCT 37.1 36.0 - 46.0 %   MCV 90.7 80.0 - 100.0 fL   MCH 29.1 26.0 - 34.0 pg   MCHC 32.1 30.0 - 36.0 g/dL   RDW 14.6 11.5 - 15.5 %   Platelets 199 150 - 400 K/uL   nRBC 0.0 0.0 - 0.2 %    Comment: Performed at [facility], [address] [place], [place] [address]  Glucose, capillary     Status: None   Collection Time: 06/19/18  8:05 AM  Result Value Ref Range   Glucose-Capillary 80 70 - 99 mg/dL  Glucose, capillary     Status: None   Collection Time: 06/19/18 11:40 AM  Result  Value Ref Range   Glucose-Capillary 80 70 - 99 mg/dL  Glucose, capillary     Status: None   Collection Time: 06/19/18  5:03 PM  Result Value Ref Range   Glucose-Capillary 72 70 - 99 mg/dL  Glucose, capillary     Status: Abnormal   Collection Time: 06/19/18  9:18 PM  Result Value Ref Range   Glucose-Capillary 108 (H) 70 - 99 mg/dL  Glucose, capillary     Status: Abnormal   Collection Time: 06/20/18  7:37 AM  Result Value Ref Range   Glucose-Capillary 67 (L) 70 - 99 mg/dL    Current Facility-Administered Medications  Medication Dose Route Frequency Provider Last Rate Last Dose  . 0.9 %  sodium chloride infusion  250 mL Intravenous PRN Pyreddy, Reatha Harps, MD      . acidophilus (RISAQUAD) capsule 1 capsule  1 capsule Oral TID WC Pyreddy, Reatha Harps, MD   1 capsule at 06/20/18 1008  . albuterol (PROVENTIL) (2.5 MG/3ML) 0.083% nebulizer solution 2.5 mg  2.5 mg Nebulization Q6H Arta Silence, MD   2.5 mg at 06/20/18 0740  . albuterol (PROVENTIL) (2.5 MG/3ML) 0.083% nebulizer solution 2.5 mg  2.5 mg Nebulization Q4H PRN Arta Silence, MD   2.5 mg at 06/14/18 0301  . amiodarone (PACERONE) tablet 200 mg  200 mg Oral BID Saundra Shelling, MD   200 mg at 06/20/18 1007  . apixaban (ELIQUIS) tablet 5 mg  5 mg Oral BID Saundra Shelling, MD   5 mg at 06/20/18 1006  . aspirin EC tablet 81 mg  81 mg Oral Daily Saundra Shelling, MD  81 mg at 06/20/18 1004  . benzonatate (TESSALON) capsule 200 mg  200 mg Oral TID PRN Mayo, Pete Pelt, MD      . busPIRone (BUSPAR) tablet 15 mg  15 mg Oral TID Saundra Shelling, MD   15 mg at 06/20/18 1008  . calcium-vitamin D (OSCAL WITH D) 500-200 MG-UNIT per tablet 1 tablet  1 tablet Oral BID Saundra Shelling, MD   1 tablet at 06/20/18 1005  . chlorhexidine (PERIDEX) 0.12 % solution 15 mL  15 mL Mouth Rinse BID Flora Lipps, MD   15 mL at 06/20/18 1008  . clonazePAM (KLONOPIN) disintegrating tablet 0.25 mg  0.25 mg Oral BID Epifanio Lesches, MD   0.25 mg at 06/20/18 1007  .  digoxin (LANOXIN) tablet 0.0625 mg  0.0625 mg Oral Daily Pyreddy, Pavan, MD   0.0625 mg at 06/20/18 1003  . divalproex (DEPAKOTE ER) 24 hr tablet 250 mg  250 mg Oral BID Saundra Shelling, MD   250 mg at 06/20/18 1008  . feeding supplement (ENSURE ENLIVE) (ENSURE ENLIVE) liquid 237 mL  237 mL Oral BID BM Pyreddy, Pavan, MD   237 mL at 06/20/18 1010  . furosemide (LASIX) injection 40 mg  40 mg Intravenous BID Epifanio Lesches, MD   40 mg at 06/20/18 1010  . gabapentin (NEURONTIN) capsule 100 mg  100 mg Oral BID WC Pyreddy, Pavan, MD   100 mg at 06/20/18 1007  . guaiFENesin (MUCINEX) 12 hr tablet 600 mg  600 mg Oral BID Saundra Shelling, MD   600 mg at 06/20/18 1005  . guaiFENesin-dextromethorphan (ROBITUSSIN DM) 100-10 MG/5ML syrup 5 mL  5 mL Oral Q4H PRN Saundra Shelling, MD   5 mL at 06/13/18 2005  . hydrALAZINE (APRESOLINE) injection 10-20 mg  10-20 mg Intravenous Q4H PRN Awilda Bill, NP   10 mg at 06/11/18 0053  . hydrALAZINE (APRESOLINE) tablet 25 mg  25 mg Oral BID Awilda Bill, NP   Stopped at 06/18/18 2200  . hydrocortisone (ANUSOL-HC) 2.5 % rectal cream 1 application  1 application Topical QID PRN Epifanio Lesches, MD      . insulin aspart (novoLOG) injection 0-15 Units  0-15 Units Subcutaneous TID WC Saundra Shelling, MD   2 Units at 06/18/18 1704  . insulin aspart (novoLOG) injection 0-5 Units  0-5 Units Subcutaneous QHS Pyreddy, Pavan, MD      . loperamide (IMODIUM) capsule 2 mg  2 mg Oral PRN Pyreddy, Reatha Harps, MD      . meclizine (ANTIVERT) tablet 12.5 mg  12.5 mg Oral BID PRN Saundra Shelling, MD      . MEDLINE mouth rinse  15 mL Mouth Rinse q12n4p Flora Lipps, MD   15 mL at 06/18/18 1528  . Melatonin TABS 10 mg  10 mg Oral QHS Saundra Shelling, MD   10 mg at 06/19/18 2309  . menthol-cetylpyridinium (CEPACOL) lozenge 3 mg  1 lozenge Oral PRN Mayo, Pete Pelt, MD      . metoprolol tartrate (LOPRESSOR) injection 5 mg  5 mg Intravenous Q6H PRN Conforti, John, DO   5 mg at 06/10/18 1754   . multivitamin with minerals tablet 1 tablet  1 tablet Oral Daily Saundra Shelling, MD   1 tablet at 06/20/18 1005  . omega-3 acid ethyl esters (LOVAZA) capsule 1 g  1 g Oral Daily Pyreddy, Pavan, MD   1 g at 06/17/18 0857  . ondansetron (ZOFRAN) tablet 4 mg  4 mg Oral Q6H PRN Saundra Shelling, MD  Or  . ondansetron (ZOFRAN) injection 4 mg  4 mg Intravenous Q6H PRN Pyreddy, Reatha Harps, MD      . oxybutynin (DITROPAN-XL) 24 hr tablet 10 mg  10 mg Oral QHS Saundra Shelling, MD   10 mg at 06/19/18 2301  . polyethylene glycol (MIRALAX / GLYCOLAX) packet 17 g  17 g Oral BID Sela Hua, MD   17 g at 06/18/18 1047  . protein supplement (PREMIER PROTEIN) liquid - approved for s/p bariatric surgery  11 oz Oral BID BM Pyreddy, Pavan, MD   11 oz at 06/19/18 1400  . risperiDONE (RISPERDAL) tablet 1 mg  1 mg Oral QHS Pyreddy, Reatha Harps, MD   1 mg at 06/19/18 2253  . senna-docusate (Senokot-S) tablet 1 tablet  1 tablet Oral QHS PRN Pyreddy, Reatha Harps, MD      . sodium chloride flush (NS) 0.9 % injection 3 mL  3 mL Intravenous Q12H Pyreddy, Pavan, MD   3 mL at 06/20/18 1011  . sodium chloride flush (NS) 0.9 % injection 3 mL  3 mL Intravenous PRN Pyreddy, Reatha Harps, MD      . traZODone (DESYREL) tablet 25 mg  25 mg Oral QHS PRN Awilda Bill, NP   25 mg at 06/11/18 0235  . vitamin B-12 (CYANOCOBALAMIN) tablet 1,000 mcg  1,000 mcg Oral Daily Pyreddy, Reatha Harps, MD   1,000 mcg at 06/20/18 1006  . vitamin C (ASCORBIC ACID) tablet 250 mg  250 mg Oral BID Saundra Shelling, MD   250 mg at 06/20/18 1006    Musculoskeletal: Strength & Muscle Tone: within normal limits Gait & Station: not formely assesed Patient leans: N/A  Psychiatric Specialty Exam: Physical Exam  Nursing note and vitals reviewed. Constitutional: She is oriented to person, place, and time. She appears well-developed and well-nourished. She appears distressed.  Obese  HENT:  Head: Normocephalic and atraumatic.  Eyes: EOM are normal.  Neck: Normal range of  motion.  Cardiovascular: Normal rate.  Respiratory: She has wheezes.  Labored breathing  Musculoskeletal: Normal range of motion.  Neurological: She is alert and oriented to person, place, and time.  Skin: Skin is warm and dry.    Review of Systems  Unable to perform ROS: Other  Respiratory: Positive for shortness of breath and wheezing.   Patient requested not too much talking right now due to feeling short of breath.  Blood pressure (!) 108/39, pulse 74, temperature 98.4 F (36.9 C), temperature source Oral, resp. rate 17, height 5\' 5"  (1.651 m), weight 110.3 kg, SpO2 91 %.Body mass index is 40.47 kg/m.  General Appearance: Casual  Eye Contact:  Minimal  Speech:  Slow  Volume:  Decreased  Mood:  Anxious and Dysphoric  Affect:  Depressed  Thought Process:  Coherent  Orientation:  Full (Time, Place, and Person)  Thought Content:  Logical and Hallucinations: None  Suicidal Thoughts:  No  Homicidal Thoughts:  No  Memory:  NA  Judgement:  Fair  Insight:  Fair  Psychomotor Activity:  Normal  Concentration:  Concentration: Fair  Recall:  [employer] of Knowledge:  Fair  Language:  Fair  Akathisia:  No  Handed:  Right  AIMS (if indicated):     Assets:  Communication Skills  ADL's:  Intact  Cognition:  WNL  Sleep:   Patient reports poor, however nurses report adequate.     Treatment Plan Summary: Vicki Morales is 78 years old Caucasian female with past psychiatric history of MDD, anxiety disorder, restless leg syndrome and  medical history of COPD and CHF, admitted on the medical floor after she was brought to the ER with shortness of breath secondary to COPD and CHF.  MDD, recurrent, severe generalized anxiety disorder -Continue Zoloft 50 mg p.o. daily -Klonopin 0.25 mg p.o. twice daily -Continue Risperdal and Depakote as prescribed  [place] comorbidities- COPD and CHF Continue treatment as per primary team  Disposition: No evidence of imminent risk to self or others at  present.   Patient does not meet criteria for psychiatric inpatient admission. Supportive therapy provided about ongoing stressors.     Lavella Hammock, MD 06/20/2018 10:42 AM

## 2018-06-20 NOTE — Progress Notes (Signed)
[place] at [place] NAME: Vicki Morales    MR#:  [id number]  DATE OF BIRTH:  [birth year] Alert, still has urine retention, urology consult placed. REVIEW OF SYSTEMS:  Review of Systems  Constitutional: Positive for malaise/fatigue. Negative for chills and fever.  HENT: Negative for congestion and sore throat.   Eyes: Negative for blurred vision and double vision.  Respiratory: Negative for cough and shortness of breath.   Cardiovascular: Negative for chest pain and leg swelling.  Gastrointestinal: Negative for nausea and vomiting.  Genitourinary: Negative for dysuria and urgency.  Musculoskeletal: Negative for back pain and neck pain.  Neurological: Negative for dizziness and headaches.  Psychiatric/Behavioral: Negative for depression. The patient is nervous/anxious.     DRUG ALLERGIES:   Allergies  Allergen Reactions  . Arthrotec [Diclofenac-Misoprostol] Hives  . Codeine Other (See Comments)    Headache   . Diclofenac Other (See Comments) and Hives    Pt unsure allergy  . Hydrochlorothiazide Other (See Comments)    Weakness   . Penicillins Hives    Has patient had a PCN reaction causing immediate rash, facial/tongue/throat swelling, SOB or lightheadedness with hypotension: No Has patient had a PCN reaction causing severe rash involving mucus membranes or skin necrosis: No Has patient had a PCN reaction that required hospitalization: No Has patient had a PCN reaction occurring within the last 10 years: No If all of the above answers are "NO", then may proceed with Cephalosporin use.  . Sulfa Antibiotics Hives  . Tiotropium Bromide Monohydrate Other (See Comments)    Blurry vision   . Tylenol [Acetaminophen] Other (See Comments)    Doesn't work  . Morphine Hives and Rash  . Pantoprazole Rash   VITALS:  Blood pressure (!) 108/39, pulse 74, temperature 98.4 F (36.9 C), temperature source Oral, resp. rate 17, height 5\' 5"  (1.651  m), weight 110.3 kg, SpO2 91 %. PHYSICAL EXAMINATION:  Physical Exam  GENERAL:  78 y.o.-year-old patient alert during my exam but she was confused earlier in the morning  EYES: Pupils equal, round, reactive to light and accommodation. No scleral icterus. Extraocular muscles intact.  HEENT: Head atraumatic, normocephalic. Oropharynx and nasopharynx clear.  NECK:  Supple, no jugular venous distention. No thyroid enlargement, no tenderness.  LUNGS:   patient has expiratory wheezing upper lung fields bilaterally CARDIOVASCULAR: S1, S2 normal. No murmurs, rubs, or gallops.  ABDOMEN: Soft, nontender, nondistended. Bowel sounds present. No organomegaly or mass.  EXTREMITIES: + Nonpitting bilateral lower extremity edema, no cyanosis, or clubbing.  NEUROLOGIC:  Cranial nerves II through XII grossly intact, no focal deficits, +global weakness, sensation intact PSYCHIATRIC: The patient is alert and oriented x 2 SKIN: No obvious rash, lesion, or ulcer.  LABORATORY PANEL:  Female CBC Recent Labs  Lab 06/19/18 0429  WBC 9.7  HGB 11.9*  HCT 37.1  PLT 199   ------------------------------------------------------------------------------------------------------------------ Chemistries  Recent Labs  Lab 06/19/18 0429  NA 132*  K 3.6  CL 82*  CO2 42*  GLUCOSE 105*  BUN 45*  CREATININE 0.99  CALCIUM 8.2*   RADIOLOGY:  No results found. ASSESSMENT AND PLAN:   Acute hypercapnic/hypoxemic respiratory failure- secondary to CHF and COPD exacerbation.  Continue IV steroids, bronchodilators. Acute on chronic diastolic congestive heart failure exacerbation-echo showed EF 65 to 70%, started back on Lasix, she is feeling much better today, sodium improved from 1 24-1 31.  Continue Lasix.  Acute COPD exacerbation-less wheezing today.  Continue bronchodilators, steroids.  Scheduled bronchodilators, prednisone  AKI-better, improved.. Chronic atrial fibrillation- rate controlled -Continue Eliquis and  amiodarone Hyponatremia likely dilutional, improved with Lasix, fluid restriction.  Continue fluid restriction up to 1.2 L to 4 hours  Deconditioning, physical therapy recommends SNF. Anxiety: Xanax and start Klonopin Depression, seen by psychiatrist,  Urine retention, continue Foley catheter,urology consult appreciated, spoke with Dr. Bernardo Heater. all the records are reviewed and case discussed with Care Management/Social Worker. Management plans discussed with the patient, family and they are in agreement.  CODE STATUS: DNR  TOTAL TIME TAKING CARE OF THIS PATIENT: 35 minutes.   More than 50% of the time was spent in counseling/coordination of care: YES  POSSIBLE D/C tomorrow, DEPENDING ON CLINICAL CONDITION.   Epifanio Lesches M.D on 06/20/2018 at 11:08 AM  Between 7am to 6pm - Pager - [phone]  After 6pm go to [email or web address] - Proofreader  [facility] [employer]  Office  [phone]  CC: Primary care physician; Birdie Sons, MD  Note: This dictation was prepared with Dragon dictation along with smaller phrase technology. Any transcriptional errors that result from this process are unintentional.

## 2018-06-20 NOTE — Progress Notes (Signed)
Contacted MD concerning pt BP readings 105/36, 108/30, 114/34. Diastolic trending lower than previous days. MD aware. Holding Lasix, amiodarone and hydralazine.  Also holding clonazepam with instruction to give later if needed. Continuing to monitor.

## 2018-06-21 ENCOUNTER — Inpatient Hospital Stay: Payer: Medicare HMO

## 2018-06-21 DIAGNOSIS — R339 Retention of urine, unspecified: Secondary | ICD-10-CM

## 2018-06-21 LAB — GLUCOSE, CAPILLARY
GLUCOSE-CAPILLARY: 101 mg/dL — AB (ref 70–99)
GLUCOSE-CAPILLARY: 99 mg/dL (ref 70–99)
GLUCOSE-CAPILLARY: 148 mg/dL — AB (ref 70–99)
Glucose-Capillary: 80 mg/dL (ref 70–99)

## 2018-06-21 MED ORDER — SODIUM POLYSTYRENE SULFONATE 15 GM/60ML PO SUSP: 30.0000 g | Freq: Once | ORAL | Status: DC

## 2018-06-21 MED ORDER — FUROSEMIDE 10 MG/ML IJ SOLN: 60.0000 mg | Freq: Once | INTRAMUSCULAR | Status: DC

## 2018-06-21 NOTE — Progress Notes (Signed)
Updated Dr. Vianne Bulls on pt status last night, hypotensive and a run of Vtack, had to place pt back on 3L O2 due to decreased sats, and the medications that were held last night. Pt is still hypotensive with diastolic trending down. Verbal order hold Po lasix at 1000 and give 1300. administer all other medications

## 2018-06-21 NOTE — Care Management Important Message (Signed)
Important Message  Patient Details  Name: [name] MRN: [id number] Date of Birth: [birth year]   Medicare Important Message Given:  Yes    [name] A Deb Loudin 06/21/2018, 11:36 AM

## 2018-06-21 NOTE — Progress Notes (Signed)
[place] at [place] NAME: Vicki Morales    MR#:  [id number]  DATE OF BIRTH:   Feeling short of breath, did not get amiodarone last night and developed V. tach early this morning, patient denies any chest pain now, has some shortness of breath, requiring oxygen 3 L to keep sats above 90%. REVIEW OF SYSTEMS:  Review of Systems  Constitutional: Negative for chills, fever and malaise/fatigue.  HENT: Negative for congestion and sore throat.   Eyes: Negative for blurred vision and double vision.  Respiratory: Positive for shortness of breath and wheezing. Negative for cough.   Cardiovascular: Negative for chest pain and leg swelling.  Gastrointestinal: Negative for nausea and vomiting.  Genitourinary: Negative for dysuria and urgency.  Musculoskeletal: Negative for back pain and neck pain.  Neurological: Negative for dizziness and headaches.  Psychiatric/Behavioral: Negative for depression. The patient is nervous/anxious.     DRUG ALLERGIES:   Allergies  Allergen Reactions  . Arthrotec [Diclofenac-Misoprostol] Hives  . Codeine Other (See Comments)    Headache   . Diclofenac Other (See Comments) and Hives    Pt unsure allergy  . Hydrochlorothiazide Other (See Comments)    Weakness   . Penicillins Hives    Has patient had a PCN reaction causing immediate rash, facial/tongue/throat swelling, SOB or lightheadedness with hypotension: No Has patient had a PCN reaction causing severe rash involving mucus membranes or skin necrosis: No Has patient had a PCN reaction that required hospitalization: No Has patient had a PCN reaction occurring within the last 10 years: No If all of the above answers are "NO", then may proceed with Cephalosporin use.  . Sulfa Antibiotics Hives  . Tiotropium Bromide Monohydrate Other (See Comments)    Blurry vision   . Tylenol [Acetaminophen] Other (See Comments)    Doesn't work  . Morphine Hives and Rash  .  Pantoprazole Rash   VITALS:  Blood pressure (!) 144/41, pulse 66, temperature 98.3 F (36.8 C), temperature source Oral, resp. rate 16, height 5\' 5"  (1.651 m), weight 110.8 kg, SpO2 93 %. PHYSICAL EXAMINATION:  Physical Exam  GENERAL:  78 y.o.-year-old patient alert during my exam but she was confused earlier in the morning  EYES: Pupils equal, round, reactive to light . No scleral icterus. Extraocular muscles intact.  HEENT: Head atraumatic, normocephalic. Oropharynx and nasopharynx clear.  NECK:  Supple, no jugular venous distention. No thyroid enlargement, no tenderness.  LUNGS:  Basal crepitus.  No wheezing, not using accessory muscles of respiration. CARDIOVASCULAR: S1, S2 normal. No murmurs, rubs, or gallops.  ABDOMEN: Soft, nontender, nondistended. Bowel sounds present. No organomegaly or mass.  EXTREMITIES: + Nonpitting bilateral lower extremity edema, no cyanosis, or clubbing.  NEUROLOGIC:  Cranial nerves II through XII grossly intact, no focal deficits, +global weakness, sensation intact PSYCHIATRIC: The patient is alert and oriented x 2 SKIN: No obvious rash, lesion, or ulcer.  LABORATORY PANEL:  Female CBC Recent Labs  Lab 06/19/18 0429  WBC 9.7  HGB 11.9*  HCT 37.1  PLT 199   ------------------------------------------------------------------------------------------------------------------ Chemistries  Recent Labs  Lab 06/19/18 0429  NA 132*  K 3.6  CL 82*  CO2 42*  GLUCOSE 105*  BUN 45*  CREATININE 0.99  CALCIUM 8.2*   RADIOLOGY:  No results found. ASSESSMENT AND PLAN:   Acute hypercapnic/hypoxemic respiratory failure- secondary to CHF and COPD exacerbation.  O2 sats dropped to 85% on room air again last night, now on 3 L of  oxygen, continue oxygen, bronchodilators, decrease the dose of Lasix yesterday due to hypotension, repeat chest x-ray again.  Acute on chronic diastolic congestive heart failure exacerbation-echo showed EF 65 to 70%, continue fluid  restriction Hypotension yesterday, resolved for now, did not get amiodarone last night due to has soft blood pressure, had V. tach this morning that resolved, patient denies any chest pain, resume amiodarone, hold hydralazine, decrease the dose of Lasix and see how she does.   Acute COPD exacerbation-less wheezing today.  Continue bronchodilators, steroids.    AKI-better, improved. . Chronic atrial fibrillation- rate controlled, -Continue Eliquis and amiodarone  Hyponatremia likely dilutional, improved with Lasix, fluid restriction.  Continue fluid restriction up to 1.2 L to 4 hours, sodium improved from 124-1 32.   Deconditioning, physical therapy recommends SNF.  Back to [facility] able.  Unable to discharge because of episode of hypotension, V. tach, adjusting antihypertensives, amiodarone  Anxiety: Xanax and started Klonopin never started on Zoloft by psychiatry even though notes this to start Zoloft.  So started  On  Zoloft.,  Continue Klonopin, Neurontin.  Depression, seen by psychiatrist, medications are adjusted.  Urine retention, continue Foley catheter, seen by Dr. Bernardo Heater, recommended to leave the Foley for 7 to 10 days, voiding trial can be done at nursing home.  Discontinued oxybutynin.   all the records are reviewed and case discussed with Care Management/Social Worker. Management plans discussed with the patient, family and they are in agreement.  CODE STATUS: DNR  TOTAL TIME TAKING CARE OF THIS PATIENT: 35 minutes.   More than 50% of the time was spent in counseling/coordination of care: YES  POSSIBLE D/C tomorrow, DEPENDING ON CLINICAL CONDITION.   Epifanio Lesches M.D on 06/21/2018 at 10:35 AM  Between 7am to 6pm - Pager - [phone]  After 6pm go to [email or web address] - Proofreader  [facility]  Office  [phone]  CC: Primary care physician; Birdie Sons, MD  Note: This dictation was prepared with Dragon  dictation along with smaller phrase technology. Any transcriptional errors that result from this process are unintentional.

## 2018-06-22 LAB — CBC
HCT: 35.4 % — ABNORMAL LOW (ref 36.0–46.0)
Hemoglobin: 10.9 g/dL — ABNORMAL LOW (ref 12.0–15.0)
MCH: 28.8 pg (ref 26.0–34.0)
MCHC: 30.8 g/dL (ref 30.0–36.0)
MCV: 93.7 fL (ref 80.0–100.0)
Platelets: 165 10*3/uL (ref 150–400)
RBC: 3.78 MIL/uL — AB (ref 3.87–5.11)
RDW: 15.1 % (ref 11.5–15.5)
WBC: 10.3 10*3/uL (ref 4.0–10.5)
nRBC: 0.2 % (ref 0.0–0.2)

## 2018-06-22 LAB — CREATININE, SERUM
Creatinine, Ser: 0.91 mg/dL (ref 0.44–1.00)
GFR calc Af Amer: >60 mL/min (ref >60)

## 2018-06-22 LAB — GLUCOSE, CAPILLARY
GLUCOSE-CAPILLARY: 135 mg/dL — AB (ref 70–99)
Glucose-Capillary: 79 mg/dL (ref 70–99)
Glucose-Capillary: 112 mg/dL — ABNORMAL HIGH (ref 70–99)
Glucose-Capillary: 118 mg/dL — ABNORMAL HIGH (ref 70–99)

## 2018-06-22 NOTE — Progress Notes (Addendum)
Patient refused medications. Patient stated "do not tell my daughter". Patient educated on purpose of the medications. Patient decided to take a select few of medications.

## 2018-06-22 NOTE — Progress Notes (Signed)
[place] at [place] NAME: Vicki Morales    MR#:  [id number]  DATE OF BIRTH:  [birth year]  SUBJECTIVE:  CHIEF COMPLAINT:   Chief Complaint  Patient presents with  . Respiratory Distress  Patient seen today On oxygen via nasal cannula at 2 to 3 L No new runs of ventricular tachycardia Blood pressure is on the lower side  REVIEW OF SYSTEMS:    ROS  CONSTITUTIONAL: No documented fever. Has fatigue, weakness. No weight gain, no weight loss.  EYES: No blurry or double vision.  ENT: No tinnitus. No postnasal drip. No redness of the oropharynx.  RESPIRATORY: No cough, no wheeze, no hemoptysis. No dyspnea.  CARDIOVASCULAR: No chest pain. No orthopnea. No palpitations. No syncope.  GASTROINTESTINAL: No nausea, no vomiting or diarrhea. No abdominal pain. No melena or hematochezia.  GENITOURINARY: No dysuria or hematuria.  ENDOCRINE: No polyuria or nocturia. No heat or cold intolerance.  HEMATOLOGY: No anemia. No bruising. No bleeding.  INTEGUMENTARY: No rashes. No lesions.  MUSCULOSKELETAL: No arthritis. No swelling. No gout.  NEUROLOGIC: No numbness, tingling, or ataxia. No seizure-type activity.  PSYCHIATRIC: No anxiety. No insomnia. No ADD.   DRUG ALLERGIES:   Allergies  Allergen Reactions  . Arthrotec [Diclofenac-Misoprostol] Hives  . Codeine Other (See Comments)    Headache   . Diclofenac Other (See Comments) and Hives    Pt unsure allergy  . Hydrochlorothiazide Other (See Comments)    Weakness   . Penicillins Hives    Has patient had a PCN reaction causing immediate rash, facial/tongue/throat swelling, SOB or lightheadedness with hypotension: No Has patient had a PCN reaction causing severe rash involving mucus membranes or skin necrosis: No Has patient had a PCN reaction that required hospitalization: No Has patient had a PCN reaction occurring within the last 10 years: No If all of the above answers are "NO", then may proceed  with Cephalosporin use.  . Sulfa Antibiotics Hives  . Tiotropium Bromide Monohydrate Other (See Comments)    Blurry vision   . Tylenol [Acetaminophen] Other (See Comments)    Doesn't work  . Morphine Hives and Rash  . Pantoprazole Rash    VITALS:  Blood pressure (!) 121/53, pulse 65, temperature 98 F (36.7 C), temperature source Oral, resp. rate 14, height 5\' 5"  (1.651 m), weight 112 kg, SpO2 98 %.  PHYSICAL EXAMINATION:   Physical Exam  GENERAL:  78 y.o.-year-old obese patient lying in the bed with no acute distress.  EYES: Pupils equal, round, reactive to light and accommodation. No scleral icterus. Extraocular muscles intact.  HEENT: Head atraumatic, normocephalic. Oropharynx and nasopharynx clear.  NECK:  Supple, no jugular venous distention. No thyroid enlargement, no tenderness.  LUNGS: Normal breath sounds bilaterally, no wheezing, rales, rhonchi. No use of accessory muscles of respiration.  CARDIOVASCULAR: S1, S2 normal. No murmurs, rubs, or gallops.  ABDOMEN: Soft, nontender, nondistended. Bowel sounds present. No organomegaly or mass.  EXTREMITIES: No cyanosis, clubbing  has edema b/l.    NEUROLOGIC: Cranial nerves II through XII are intact. No focal Motor or sensory deficits b/l.   PSYCHIATRIC: The patient is alert and oriented x 2.  SKIN: No obvious rash, lesion, or ulcer.   LABORATORY PANEL:   CBC Recent Labs  Lab 06/22/18 0324  WBC 10.3  HGB 10.9*  HCT 35.4*  PLT 165   ------------------------------------------------------------------------------------------------------------------ Chemistries  Recent Labs  Lab 06/19/18 0429 06/22/18 0324  NA 132*  --   K 3.6  --  CL 82*  --   CO2 42*  --   GLUCOSE 105*  --   BUN 45*  --   CREATININE 0.99 0.91  CALCIUM 8.2*  --    ------------------------------------------------------------------------------------------------------------------  Cardiac Enzymes No results for input(s): TROPONINI in the last  168 hours. ------------------------------------------------------------------------------------------------------------------  RADIOLOGY:  Dg Chest Port 1 View  Result Date: 06/21/2018 CLINICAL DATA:  Shortness of breath and productive cough. EXAM: PORTABLE CHEST 1 VIEW COMPARISON:  Single-view of the chest 06/15/2018, 06/09/2018 and 02/19/2018. FINDINGS: Cardiomegaly and vascular congestion appear unchanged. Calcified right paratracheal lymph node is also unchanged. Aortic atherosclerosis is seen. No pneumothorax or pleural effusion. IMPRESSION: No acute disease. No change in cardiomegaly and vascular congestion Electronically Signed   By: Inge Rise M.D.   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 to facility  All the records are reviewed and case discussed with Care Management/Social Worker. Management plans discussed with the patient, family and they are in agreement.  CODE STATUS:  DNR  DVT Prophylaxis: SCDs  TOTAL TIME TAKING CARE OF THIS PATIENT: 35 minutes.   POSSIBLE D/C IN 1 to 2 DAYS, DEPENDING ON CLINICAL CONDITION.  Saundra Shelling M.D on 06/22/2018 at 1:52 PM  Between 7am to 6pm - Pager - [phone]  After 6pm go to [email or web address] - password [email or web address] [facility]  [facility]  Office  [phone]  CC: Primary care physician; Birdie Sons, MD  Note: This dictation was prepared with Dragon dictation along with smaller phrase technology. Any transcriptional errors that result from this process are unintentional.

## 2018-06-22 NOTE — Progress Notes (Signed)
Physical Therapy Treatment Patient Details Name: Vicki Morales MRN: [id number] DOB: 05-13-1941 Today's Date: 06/22/2018    History of Present Illness Patient is a 78 y/o female that presents with respiratory distress from rehab facility. She has a PMH of COPD, chronic a-fib, GERD, HTN, orthopnea, anxiety, and depression.     PT Comments    Patient able to open eyes on command, short sentences due to SOB, spO2 and HR stable throughout session on 4L O2 via [place]. Pt able to participate in bed level exercises with  verbal encouragement, majority of exercises performed with AROM. Most difficulty with heel slides and hip abduction/adduction. With activity patient exhibited coughing, fatigue, SOB, and difficulty due to lethargy to attend to task. Due to patient's current presentation and level of fatigue after bed exercises, further mobility deferred. The patient would benefit from further skilled PT to continue to address goals, maximize mobility and function.    Follow Up Recommendations  SNF     Equipment Recommendations  Rolling walker with 5" wheels    Recommendations for Other Services       Precautions / Restrictions Precautions Precautions: Fall Restrictions Weight Bearing Restrictions: No    Mobility  Bed Mobility               General bed mobility comments: Pt lethargic at this time, begins coughing with bed level exercises, difficulty catching breath, spO2 in low 90s. bed mobility deferred at this time due to presentation.   Transfers                    Ambulation/Gait                 Stairs             Wheelchair Mobility    Modified Rankin (Stroke Patients Only)       Balance                                            Cognition                                              Exercises General Exercises - Lower Extremity Ankle Circles/Pumps: Both;10 reps;Strengthening Quad Sets: Both;10  reps;Strengthening Gluteal Sets: Both;10 reps;Strengthening Short Arc Quad: Both;10 reps;Strengthening Heel Slides: AAROM;Both;10 reps Hip ABduction/ADduction: Strengthening;10 reps;Both    General Comments        Pertinent Vitals/Pain      Home Living                      Prior Function            PT Goals (current goals can now be found in the care plan section) Progress towards PT goals: Not progressing toward goals - comment(fatigue limiting ability to participate)    Frequency    Min 2X/week      PT Plan Current plan remains appropriate    Co-evaluation              AM-PAC PT "6 Clicks" Mobility   Outcome Measure  Help needed turning from your back to your side while in a flat bed without using bedrails?: A Lot Help needed moving from lying on your back to  sitting on the side of a flat bed without using bedrails?: A Lot Help needed moving to and from a bed to a chair (including a wheelchair)?: Total Help needed standing up from a chair using your arms (e.g., wheelchair or bedside chair)?: Total Help needed to walk in hospital room?: Total Help needed climbing 3-5 steps with a railing? : Total 6 Click Score: 8    End of Session Equipment Utilized During Treatment: Oxygen(4L) Activity Tolerance: Patient limited by fatigue;Patient limited by lethargy Patient left: in bed;with call bell/phone within reach;with bed alarm set;with family/visitor present Nurse Communication: Mobility status PT Visit Diagnosis: Unsteadiness on feet (R26.81);Muscle weakness (generalized) (M62.81)     Time: 6950-7225 PT Time Calculation (min) (ACUTE ONLY): 23 min  Charges:  $Therapeutic Exercise: 8-22 mins                     Lieutenant Diego PT, DPT 1:23 PM,06/22/18 [phone]

## 2018-06-23 LAB — GLUCOSE, CAPILLARY
Glucose-Capillary: 75 mg/dL (ref 70–99)
Glucose-Capillary: 128 mg/dL — ABNORMAL HIGH (ref 70–99)
Glucose-Capillary: 88 mg/dL (ref 70–99)

## 2018-06-23 MED ORDER — GABAPENTIN 100 MG PO CAPS: 300.0000 mg | ORAL_CAPSULE | Freq: Two times a day (BID) | ORAL | 0 refills | Status: DC

## 2018-06-23 MED ORDER — BUSPIRONE HCL 10 MG PO TABS: 15.0000 mg | ORAL_TABLET | Freq: Three times a day (TID) | ORAL | 0 refills | Status: AC

## 2018-06-23 MED ORDER — CLONAZEPAM 0.25 MG PO TBDP: 0.2500 mg | ORAL_TABLET | Freq: Two times a day (BID) | ORAL | 0 refills | Status: DC

## 2018-06-23 NOTE — Discharge Summary (Addendum)
[facility] NAME: Vicki Morales    MR#:  [id number]  DATE OF BIRTH:  [birth year]  DATE OF ADMISSION:  06/09/2018 ADMITTING PHYSICIAN: Saundra Shelling, MD  DATE OF DISCHARGE: 06/23/2018  PRIMARY CARE PHYSICIAN: Birdie Sons, MD   ADMISSION DIAGNOSIS:  Acute respiratory failure with hypoxia and hypercapnia (HCC) [J96.01, J96.02] Acute on chronic congestive heart failure, unspecified heart failure type ([place]) [I50.9]  DISCHARGE DIAGNOSIS:  Acute on chronic respiratory failure Hypoxia Acute on chronic diastolic congestive heart failure exacerbation Emphysema GERD Hypertension Restless leg syndrome Chronic atrial fibrillation Hypotension Depression Urinary retention Anxiety disorder  SECONDARY DIAGNOSIS:   Past Medical History:  Diagnosis Date  . Anxiety   . Arthritis   . Cataract    right eye  . COPD (chronic obstructive pulmonary disease) ([place])   . Depression   . Dyspnea    DOE  . Dysrhythmia   . Edema    FEET/ANKLES  . GERD (gastroesophageal reflux disease)   . H/O wheezing   . History of hiatal hernia   . HOH (hard of hearing)   . Hypertension   . Incontinence of urine in female   . Orthopnea   . Pain    CHRONIC KNEE  . Pain    CHRONIC KNEE  . Paranoid disorder ([place])   . RLS (restless legs syndrome)   . Tremors of nervous system    HANDS     ADMITTING HISTORY Vicki Morales  is a 78 y.o. female with a known history of COPD, chronic congestive heart failure, restless leg syndrome, arthritis, anxiety disorder is a resident of facility.  And was found to be in respiratory distress with elevated respiratory rate at [facility] facility.  She had been short of breath since last night.  EMS gave her nebulization treatment and Solu-Medrol in route to the hospital.  Patient little lethargic.  Not able to give much history.  She was put on BiPAP and stabilized in the emergency room.  CO2 level is elevated.   She is also fluid overloaded.   HOSPITAL COURSE:  Patient was admitted to stepdown unit for hypoxic hypercapnic respiratory failure.  She was put on BiPAP for respiratory distress.  Patient received aggressive nebulization treatments.  She was diuresed with Lasix IV for acute on chronic diastolic heart failure exacerbation.  Electrolytes were closely monitored her.  Mental status improved after hypercapnia improved.  She received Solu-Medrol intravenously during the hospitalization.  Patient was weaned off BiPAP and transferred to medical floor.  She was switched to oral steroids and completed a course.  Patient was switched to oral Lasix.  In view of low blood pressure her hydralazine was stopped and Lasix dose was decreased.  She was weaned to oxygen via nasal cannula at 3 L and remained comfortable.  Patient is DNR by [facility].  MRSA PCR was negative blood cultures did not reveal any growth.  Continued oral Eliquis for anticoagulation for atrial fibrillation.  She also continued amiodarone for rate control.  She also had hyponatremia which improved with fluid restriction and Lasix.  She was worked up with echocardiogram which showed EF of 65 to 70%.  She was evaluated by physical therapy who recommended SNF placement.  Patient has a bed at SNF.  Was evaluated by psychiatry Dr. during the hospitalization who recommended gabapentin 300 Mg orally twice daily  and continue BuSpar as prescribed.  Advised to wean off benzodiazepines and switch to oral Klonopin  0.25 mg orally 3 times a day.  Patient hemodynamically stable will be discharged to SNF today.  Her urinary retention patient was seen by urologist.  They recommended to continue Foley catheter for 7 to 10 days and then voiding trial in the nursing home.  Advised to discontinue oxybutynin.  CONSULTS OBTAINED:  Treatment Team:  Whitman Hero, MD Abbie Sons, MD  DRUG ALLERGIES:   Allergies  Allergen Reactions  . Arthrotec  [Diclofenac-Misoprostol] Hives  . Codeine Other (See Comments)    Headache   . Diclofenac Other (See Comments) and Hives    Pt unsure allergy  . Hydrochlorothiazide Other (See Comments)    Weakness   . Penicillins Hives    Has patient had a PCN reaction causing immediate rash, facial/tongue/throat swelling, SOB or lightheadedness with hypotension: No Has patient had a PCN reaction causing severe rash involving mucus membranes or skin necrosis: No Has patient had a PCN reaction that required hospitalization: No Has patient had a PCN reaction occurring within the last 10 years: No If all of the above answers are "NO", then may proceed with Cephalosporin use.  . Sulfa Antibiotics Hives  . Tiotropium Bromide Monohydrate Other (See Comments)    Blurry vision   . Tylenol [Acetaminophen] Other (See Comments)    Doesn't work  . Morphine Hives and Rash  . Pantoprazole Rash    DISCHARGE MEDICATIONS:   Allergies as of 06/23/2018      Reactions   Arthrotec [diclofenac-misoprostol] Hives   Codeine Other (See Comments)   Headache    Diclofenac Other (See Comments), Hives   Pt unsure allergy   Hydrochlorothiazide Other (See Comments)   Weakness    Penicillins Hives   Has patient had a PCN reaction causing immediate rash, facial/tongue/throat swelling, SOB or lightheadedness with hypotension: No Has patient had a PCN reaction causing severe rash involving mucus membranes or skin necrosis: No Has patient had a PCN reaction that required hospitalization: No Has patient had a PCN reaction occurring within the last 10 years: No If all of the above answers are "NO", then may proceed with Cephalosporin use.   Sulfa Antibiotics Hives   Tiotropium Bromide Monohydrate Other (See Comments)   Blurry vision    Tylenol [acetaminophen] Other (See Comments)   Doesn't work   Morphine Hives, Rash   Pantoprazole Rash      Medication List    STOP taking these medications   ALPRAZolam 0.5 MG  tablet Commonly known as:  XANAX   hydrALAZINE 10 MG tablet Commonly known as:  APRESOLINE   oxybutynin 10 MG 24 hr tablet Commonly known as:  DITROPAN-XL   vancomycin 50 mg/mL  oral solution Commonly known as:  VANCOCIN     TAKE these medications   acetaminophen 325 MG tablet Commonly known as:  TYLENOL Take 650 mg by mouth every 4 (four) hours as needed for mild pain or moderate pain.   acidophilus Caps capsule Take 1 capsule by mouth 3 (three) times daily with meals.   albuterol (2.5 MG/3ML) 0.083% nebulizer solution Commonly known as:  PROVENTIL Take 3 mLs (2.5 mg total) by nebulization 2 (two) times daily as needed for wheezing or shortness of breath.   amiodarone 200 MG tablet Commonly known as:  PACERONE Take 1 tablet (200 mg total) by mouth 2 (two) times daily.   apixaban 5 MG Tabs tablet Commonly known as:  ELIQUIS Take 1 tablet (5 mg total) by mouth 2 (two) times daily.   ascorbic acid  250 MG tablet Commonly known as:  VITAMIN C Take 1 tablet (250 mg total) by mouth 2 (two) times daily.   aspirin 81 MG EC tablet Take 1 tablet (81 mg total) by mouth daily.   budesonide 0.5 MG/2ML nebulizer solution Commonly known as:  PULMICORT Take 2 mLs (0.5 mg total) by nebulization 2 (two) times daily.   busPIRone 10 MG tablet Commonly known as:  BUSPAR Take 1.5 tablets (15 mg total) by mouth 3 (three) times daily for 15 days.   CALCIUM 600+D 600-400 MG-UNIT tablet Generic drug:  Calcium Carbonate-Vitamin D Take 1 tablet by mouth 2 (two) times daily.   clonazePAM 0.25 MG disintegrating tablet Commonly known as:  KLONOPIN Take 1 tablet (0.25 mg total) by mouth 2 (two) times daily for 15 days.   Cranberry 250 MG Caps Take 1 capsule by mouth daily.   cyanocobalamin 1000 MCG tablet Take 1,000 mcg by mouth daily.   digoxin 0.125 MG tablet Commonly known as:  LANOXIN Take 0.5 tablets (0.0625 mg total) by mouth daily.   divalproex 250 MG 24 hr tablet Commonly  known as:  DEPAKOTE ER Take 250 mg by mouth 2 (two) times daily.   feeding supplement (ENSURE ENLIVE) Liqd Take 237 mLs by mouth 2 (two) times daily between meals.   fluticasone 50 MCG/ACT nasal spray Commonly known as:  FLONASE Place 1 spray into both nostrils daily.   Fluticasone-Umeclidin-Vilant 100-62.5-25 MCG/INH Aepb Commonly known as:  TRELEGY ELLIPTA Inhale 1 puff into the lungs daily.   furosemide 40 MG tablet Commonly known as:  LASIX Take 40 mg by mouth daily.   gabapentin 100 MG capsule Commonly known as:  NEURONTIN Take 3 capsules (300 mg total) by mouth 2 (two) times daily. What changed:    how much to take  when to take this  Another medication with the same name was removed. Continue taking this medication, and follow the directions you see here.   guaiFENesin 600 MG 12 hr tablet Commonly known as:  MUCINEX Take 1 tablet (600 mg total) by mouth 2 (two) times daily.   loperamide 2 MG capsule Commonly known as:  IMODIUM Take 1 capsule (2 mg total) by mouth as needed for diarrhea or loose stools.   magnesium oxide 400 (241.3 Mg) MG tablet Commonly known as:  MAG-OX Take 1 tablet (400 mg total) by mouth 2 (two) times daily.   meclizine 12.5 MG tablet Commonly known as:  ANTIVERT Take 12.5 mg by mouth 2 (two) times daily as needed for dizziness.   Melatonin 10 MG Caps Take 10 mg by mouth at bedtime.   midodrine 10 MG tablet Commonly known as:  PROAMATINE Take 1 tablet (10 mg total) by mouth 3 (three) times daily with meals.   montelukast 10 MG tablet Commonly known as:  SINGULAIR TAKE 1 TABLET(10 MG) BY MOUTH EVERY NIGHT What changed:  See the new instructions.   multivitamin with minerals Tabs tablet Take 1 tablet by mouth daily.   omega-3 acid ethyl esters 1 g capsule Commonly known as:  LOVAZA Take 1 capsule (1 g total) by mouth daily.   Probiotic 250 MG Caps Take 1 capsule by mouth 3 times/day as needed-between meals & bedtime. What  changed:  when to take this   protein supplement shake Liqd Commonly known as:  PREMIER PROTEIN Take 325 mLs (11 oz total) by mouth 2 (two) times daily between meals.   risperiDONE 1 MG tablet Commonly known as:  RISPERDAL Take 1  tablet (1 mg total) by mouth at bedtime.   theophylline 200 MG 24 hr capsule Commonly known as:  THEO-24 Take 1 capsule (200 mg total) by mouth daily.       Today  Patient seen and evaluated today Comfortable on oxygen via nasal cannula at 3 L Hemodynamically stable Has good appetite Discussed with family Will be discharged to SNF VITAL SIGNS:  Blood pressure (!) 144/45, pulse 69, temperature 97.6 F (36.4 C), temperature source Oral, resp. rate 20, height 5\' 5"  (1.651 m), weight 112 kg, SpO2 94 %.  I/O:    Intake/Output Summary (Last 24 hours) at 06/23/2018 1027 Last data filed at 06/23/2018 0541 Gross per 24 hour  Intake -  Output 2200 ml  Net -2200 ml    PHYSICAL EXAMINATION:  Physical Exam  GENERAL:  78 y.o.-year-old patient lying in the bed with no acute distress.  LUNGS: Normal breath sounds bilaterally, no wheezing, rales,rhonchi or crepitation. No use of accessory muscles of respiration.  CARDIOVASCULAR: S1, S2 normal. No murmurs, rubs, or gallops.  ABDOMEN: Soft, non-tender, non-distended. Bowel sounds present. No organomegaly or mass.  NEUROLOGIC: Moves all 4 extremities. PSYCHIATRIC: The patient is alert and oriented x 3.  SKIN: No obvious rash, lesion, or ulcer.   DATA REVIEW:   CBC Recent Labs  Lab 06/22/18 0324  WBC 10.3  HGB 10.9*  HCT 35.4*  PLT 165    Chemistries  Recent Labs  Lab 06/19/18 0429 06/22/18 0324  NA 132*  --   K 3.6  --   CL 82*  --   CO2 42*  --   GLUCOSE 105*  --   BUN 45*  --   CREATININE 0.99 0.91  CALCIUM 8.2*  --     Cardiac Enzymes No results for input(s): TROPONINI in the last 168 hours.  Microbiology Results  Results for orders placed or performed during the hospital  encounter of 06/09/18  MRSA PCR Screening     Status: None   Collection Time: 06/09/18  8:43 PM  Result Value Ref Range Status   MRSA by PCR NEGATIVE NEGATIVE Final    Comment:        The GeneXpert MRSA Assay (FDA approved for NASAL specimens only), is one component of a comprehensive MRSA colonization surveillance program. It is not intended to diagnose MRSA infection nor to guide or monitor treatment for MRSA infections. Performed at [facility], [address] [place], [place] [address]   CULTURE, BLOOD (ROUTINE X 2) w Reflex to ID Panel     Status: None   Collection Time: 06/09/18  9:34 PM  Result Value Ref Range Status   Specimen Description BLOOD RIGHT [facility]  Final   Special Requests   Final    BOTTLES DRAWN AEROBIC AND ANAEROBIC Blood Culture adequate volume   Culture   Final    NO GROWTH 5 DAYS Performed at [facility], [address] [place],  [address]    Report Status 06/14/2018 FINAL  Final  CULTURE, BLOOD (ROUTINE X 2) w Reflex to ID Panel     Status: None   Collection Time: 06/09/18  9:42 PM  Result Value Ref Range Status   Specimen Description BLOOD LEFT HAND  Final   Special Requests   Final    BOTTLES DRAWN AEROBIC AND ANAEROBIC Blood Culture adequate volume   Culture   Final    NO GROWTH 5 DAYS Performed at [facility], [place], [place]  [address]    Report Status 06/14/2018 FINAL  Final    RADIOLOGY:  Dg Chest Port 1 View  Result Date: 06/21/2018 CLINICAL DATA:  Shortness of breath and productive cough. EXAM: PORTABLE CHEST 1 VIEW COMPARISON:  Single-view of the chest 06/15/2018, 06/09/2018 and 02/19/2018. FINDINGS: Cardiomegaly and vascular congestion appear unchanged. Calcified right paratracheal lymph node is also unchanged. Aortic atherosclerosis is seen. No pneumothorax or pleural effusion. IMPRESSION: No acute disease. No change in cardiomegaly and vascular congestion Electronically Signed   By:  Inge Rise M.D.   On: 06/21/2018 11:52    Follow up with PCP in 1 week.  Management plans discussed with the patient, family and they are in agreement.  CODE STATUS: DNR    Code Status Orders  (From admission, onward)         Start     Ordered   06/09/18 1821  Do not attempt resuscitation (DNR)  Continuous    Question Answer Comment  In the event of cardiac or respiratory ARREST Do not call a "code blue"   In the event of cardiac or respiratory ARREST Do not perform Intubation, CPR, defibrillation or ACLS   In the event of cardiac or respiratory ARREST Use medication by any route, position, wound care, and other measures to relive pain and suffering. May use oxygen, suction and manual treatment of airway obstruction as needed for comfort.   Comments nurse may pronounce      06/09/18 1820        Code Status History    Date Active Date Inactive Code Status Order ID Comments User Context   03/05/2018 1338 03/20/2018 1812 DNR [id number]  Epifanio Lesches, MD ED   02/19/2018 1709 02/26/2018 1634 DNR [id number]  Loletha Grayer, MD ED   12/03/2017 1258 12/06/2017 1847 DNR [id number]  Gladstone Lighter, MD Inpatient   10/24/2017 1844 10/26/2017 1623 DNR [id number]  Vaughan Basta, MD Inpatient   09/23/2015 0018 09/23/2015 1823 DNR [id number]  Lance Coon, MD Inpatient    Advance Directive Documentation     Most Recent Value  Type of Advance Directive  Out of facility DNR (pink MOST or yellow form)  Pre-existing out of facility DNR order (yellow form or pink MOST form)  Physician notified to receive inpatient order  "MOST" Form in Place?  -      TOTAL TIME TAKING CARE OF THIS PATIENT ON DAY OF DISCHARGE: more than 35 minutes.   Saundra Shelling M.D on 06/23/2018 at 10:27 AM  Between 7am to 6pm - Pager - [phone]  After 6pm go to [email or web address] - password [email or web address] [facility]  [facility]  Office  [phone]  CC: Primary care physician; Birdie Sons,  MD  Note: This dictation was prepared with Dragon dictation along with smaller phrase technology. Any transcriptional errors that result from this process are unintentional.

## 2018-06-23 NOTE — Progress Notes (Addendum)
Pt is being discharged to [facility].  Continue foley at discharge per Dr Estanislado Pandy.  Called report to [employer], Scientist, physiological. AVS given and explained to pt.  Pt verbalized understanding.  2nd copy of AVS placed in discharge packet for facility.  Awaiting EMS.

## 2018-06-23 NOTE — Clinical Social Work Note (Signed)
Patient is medically ready for discharge today. CSW notified patient of discharge back to [facility] today. CSW notified patient's daughter Mellie Buccellato [phone] of discharge back to [facility] today. Patient and daughter are in agreement with discharge today. CSW also notified Claiborne Billings at [facility] of discharge today. Patient will be transported by EMS. RN to call report and call for transport.   [place], [place]

## 2018-06-23 NOTE — Plan of Care (Signed)
  Problem: Health Behavior/Discharge Planning: Goal: Ability to manage health-related needs will improve Outcome: Progressing   Problem: Clinical Measurements: Goal: Ability to maintain clinical measurements within normal limits will improve Outcome: Progressing Goal: Will remain free from infection Outcome: Progressing Goal: Diagnostic test results will improve Outcome: Progressing Goal: Respiratory complications will improve Outcome: Progressing Goal: Cardiovascular complication will be avoided Outcome: Progressing   Problem: Activity: Goal: Risk for activity intolerance will decrease Outcome: Progressing   Problem: Elimination: Goal: Will not experience complications related to bowel motility Outcome: Progressing Goal: Will not experience complications related to urinary retention Outcome: Progressing   Problem: Skin Integrity: Goal: Risk for impaired skin integrity will decrease Outcome: Progressing   

## 2018-06-26 ENCOUNTER — Telehealth: Payer: Self-pay

## 2018-06-26 DIAGNOSIS — I2729 Other secondary pulmonary hypertension: Secondary | ICD-10-CM | POA: Diagnosis not present

## 2018-06-26 DIAGNOSIS — J449 Chronic obstructive pulmonary disease, unspecified: Secondary | ICD-10-CM | POA: Diagnosis not present

## 2018-06-26 DIAGNOSIS — I509 Heart failure, unspecified: Secondary | ICD-10-CM | POA: Diagnosis not present

## 2018-06-26 DIAGNOSIS — I4891 Unspecified atrial fibrillation: Secondary | ICD-10-CM | POA: Diagnosis not present

## 2018-06-26 NOTE — Telephone Encounter (Signed)
Transition Care Management Follow-up Telephone Call  Date of discharge and from where: [facility] on 06/23/18  How have you been since you were released from the hospital? Doing better, not coughing much but is still coughing up whitish yellow sputum. Some weakness. Declines fever, pain, SOB, or n/v/d.  Any questions or concerns? No   Items Reviewed:  Did the pt receive and understand the discharge instructions provided? Yes   Medications obtained and verified? No, daughter does not have the current med list with her and would like to review these at the next OV.   Any new allergies since your discharge? No   Dietary orders reviewed? Yes  Do you have support? Yes   Other (ie: DME, Home Health, etc) Currently at [facility].  Functional Questionnaire: (I = Independent and D = Dependent)  Bathing/Dressing- D   Meal Prep- D  Eating- I  Maintaining continence- D, has a catheter currently.  Transferring/Ambulation- Uses a wheelchair currently.   Managing Meds- D   Follow up appointments reviewed:    PCP Hospital f/u appt confirmed? No, daughter declined HFU apt at this time.   [facility] f/u appt confirmed? N/A  Are transportation arrangements needed? No   If their condition worsens, is the pt aware to call  their PCP or go to the ED? Yes  Was the patient provided with contact information for the PCP's office or ED? Yes  Was the pt encouraged to call back with questions or concerns? Yes

## 2018-06-28 DIAGNOSIS — R918 Other nonspecific abnormal finding of lung field: Secondary | ICD-10-CM | POA: Diagnosis not present

## 2018-06-30 DIAGNOSIS — J449 Chronic obstructive pulmonary disease, unspecified: Secondary | ICD-10-CM | POA: Diagnosis not present

## 2018-06-30 DIAGNOSIS — I509 Heart failure, unspecified: Secondary | ICD-10-CM | POA: Diagnosis not present

## 2018-06-30 DIAGNOSIS — I5033 Acute on chronic diastolic (congestive) heart failure: Secondary | ICD-10-CM | POA: Diagnosis not present

## 2018-06-30 DIAGNOSIS — J9602 Acute respiratory failure with hypercapnia: Secondary | ICD-10-CM | POA: Diagnosis not present

## 2018-06-30 DIAGNOSIS — J189 Pneumonia, unspecified organism: Secondary | ICD-10-CM | POA: Diagnosis not present

## 2018-06-30 DIAGNOSIS — E878 Other disorders of electrolyte and fluid balance, not elsewhere classified: Secondary | ICD-10-CM | POA: Diagnosis not present

## 2018-06-30 DIAGNOSIS — E039 Hypothyroidism, unspecified: Secondary | ICD-10-CM | POA: Diagnosis not present

## 2018-06-30 DIAGNOSIS — J441 Chronic obstructive pulmonary disease with (acute) exacerbation: Secondary | ICD-10-CM | POA: Diagnosis not present

## 2018-06-30 DIAGNOSIS — D649 Anemia, unspecified: Secondary | ICD-10-CM | POA: Diagnosis not present

## 2018-06-30 DIAGNOSIS — R601 Generalized edema: Secondary | ICD-10-CM | POA: Diagnosis not present

## 2018-07-01 ENCOUNTER — Emergency Department: Payer: Medicare HMO

## 2018-07-01 ENCOUNTER — Encounter: Payer: Self-pay | Admitting: Emergency Medicine

## 2018-07-01 ENCOUNTER — Other Ambulatory Visit: Payer: Self-pay

## 2018-07-01 ENCOUNTER — Inpatient Hospital Stay
Admission: EM | Admit: 2018-07-01 | Discharge: 2018-07-07 | DRG: 190 | Disposition: A | Discharge status: Skilled Nursing Facility | Payer: Medicare HMO | Attending: Internal Medicine | Admitting: Internal Medicine

## 2018-07-01 DIAGNOSIS — I110 Hypertensive heart disease with heart failure: Secondary | ICD-10-CM | POA: Diagnosis present

## 2018-07-01 DIAGNOSIS — J9611 Chronic respiratory failure with hypoxia: Secondary | ICD-10-CM

## 2018-07-01 DIAGNOSIS — J9622 Acute and chronic respiratory failure with hypercapnia: Secondary | ICD-10-CM | POA: Diagnosis not present

## 2018-07-01 DIAGNOSIS — I509 Heart failure, unspecified: Secondary | ICD-10-CM | POA: Diagnosis not present

## 2018-07-01 DIAGNOSIS — R069 Unspecified abnormalities of breathing: Secondary | ICD-10-CM | POA: Diagnosis not present

## 2018-07-01 DIAGNOSIS — I5033 Acute on chronic diastolic (congestive) heart failure: Secondary | ICD-10-CM | POA: Diagnosis not present

## 2018-07-01 DIAGNOSIS — Z825 Family history of asthma and other chronic lower respiratory diseases: Secondary | ICD-10-CM

## 2018-07-01 DIAGNOSIS — Z789 Other specified health status: Secondary | ICD-10-CM

## 2018-07-01 DIAGNOSIS — J45998 Other asthma: Secondary | ICD-10-CM | POA: Diagnosis not present

## 2018-07-01 DIAGNOSIS — N390 Urinary tract infection, site not specified: Secondary | ICD-10-CM | POA: Diagnosis present

## 2018-07-01 DIAGNOSIS — J9621 Acute and chronic respiratory failure with hypoxia: Secondary | ICD-10-CM | POA: Diagnosis present

## 2018-07-01 DIAGNOSIS — Z90710 Acquired absence of both cervix and uterus: Secondary | ICD-10-CM

## 2018-07-01 DIAGNOSIS — I482 Chronic atrial fibrillation, unspecified: Secondary | ICD-10-CM | POA: Diagnosis not present

## 2018-07-01 DIAGNOSIS — F0390 Unspecified dementia without behavioral disturbance: Secondary | ICD-10-CM | POA: Diagnosis not present

## 2018-07-01 DIAGNOSIS — Z87891 Personal history of nicotine dependence: Secondary | ICD-10-CM

## 2018-07-01 DIAGNOSIS — F329 Major depressive disorder, single episode, unspecified: Secondary | ICD-10-CM | POA: Diagnosis not present

## 2018-07-01 DIAGNOSIS — H9190 Unspecified hearing loss, unspecified ear: Secondary | ICD-10-CM | POA: Diagnosis present

## 2018-07-01 DIAGNOSIS — Z880 Allergy status to penicillin: Secondary | ICD-10-CM

## 2018-07-01 DIAGNOSIS — G2581 Restless legs syndrome: Secondary | ICD-10-CM | POA: Diagnosis present

## 2018-07-01 DIAGNOSIS — J9602 Acute respiratory failure with hypercapnia: Secondary | ICD-10-CM | POA: Diagnosis not present

## 2018-07-01 DIAGNOSIS — Z7901 Long term (current) use of anticoagulants: Secondary | ICD-10-CM

## 2018-07-01 DIAGNOSIS — J189 Pneumonia, unspecified organism: Secondary | ICD-10-CM

## 2018-07-01 DIAGNOSIS — R0603 Acute respiratory distress: Secondary | ICD-10-CM | POA: Diagnosis not present

## 2018-07-01 DIAGNOSIS — E876 Hypokalemia: Secondary | ICD-10-CM | POA: Diagnosis present

## 2018-07-01 DIAGNOSIS — Z803 Family history of malignant neoplasm of breast: Secondary | ICD-10-CM

## 2018-07-01 DIAGNOSIS — I279 Pulmonary heart disease, unspecified: Secondary | ICD-10-CM | POA: Diagnosis not present

## 2018-07-01 DIAGNOSIS — I272 Pulmonary hypertension, unspecified: Secondary | ICD-10-CM | POA: Diagnosis present

## 2018-07-01 DIAGNOSIS — Z818 Family history of other mental and behavioral disorders: Secondary | ICD-10-CM

## 2018-07-01 DIAGNOSIS — R319 Hematuria, unspecified: Secondary | ICD-10-CM | POA: Diagnosis not present

## 2018-07-01 DIAGNOSIS — R062 Wheezing: Secondary | ICD-10-CM

## 2018-07-01 DIAGNOSIS — Z9842 Cataract extraction status, left eye: Secondary | ICD-10-CM | POA: Diagnosis not present

## 2018-07-01 DIAGNOSIS — K219 Gastro-esophageal reflux disease without esophagitis: Secondary | ICD-10-CM | POA: Diagnosis present

## 2018-07-01 DIAGNOSIS — Z885 Allergy status to narcotic agent status: Secondary | ICD-10-CM

## 2018-07-01 DIAGNOSIS — F419 Anxiety disorder, unspecified: Secondary | ICD-10-CM | POA: Diagnosis present

## 2018-07-01 DIAGNOSIS — R0902 Hypoxemia: Secondary | ICD-10-CM | POA: Diagnosis not present

## 2018-07-01 DIAGNOSIS — R457 State of emotional shock and stress, unspecified: Secondary | ICD-10-CM | POA: Diagnosis not present

## 2018-07-01 DIAGNOSIS — J40 Bronchitis, not specified as acute or chronic: Secondary | ICD-10-CM | POA: Diagnosis not present

## 2018-07-01 DIAGNOSIS — Z888 Allergy status to other drugs, medicaments and biological substances status: Secondary | ICD-10-CM

## 2018-07-01 DIAGNOSIS — Z9981 Dependence on supplemental oxygen: Secondary | ICD-10-CM

## 2018-07-01 DIAGNOSIS — Z7401 Bed confinement status: Secondary | ICD-10-CM

## 2018-07-01 DIAGNOSIS — R339 Retention of urine, unspecified: Secondary | ICD-10-CM | POA: Diagnosis not present

## 2018-07-01 DIAGNOSIS — M1990 Unspecified osteoarthritis, unspecified site: Secondary | ICD-10-CM | POA: Diagnosis present

## 2018-07-01 DIAGNOSIS — Z7951 Long term (current) use of inhaled steroids: Secondary | ICD-10-CM

## 2018-07-01 DIAGNOSIS — E785 Hyperlipidemia, unspecified: Secondary | ICD-10-CM | POA: Diagnosis present

## 2018-07-01 DIAGNOSIS — Z961 Presence of intraocular lens: Secondary | ICD-10-CM | POA: Diagnosis present

## 2018-07-01 DIAGNOSIS — J441 Chronic obstructive pulmonary disease with (acute) exacerbation: Secondary | ICD-10-CM | POA: Diagnosis not present

## 2018-07-01 DIAGNOSIS — R1312 Dysphagia, oropharyngeal phase: Secondary | ICD-10-CM | POA: Diagnosis present

## 2018-07-01 DIAGNOSIS — D649 Anemia, unspecified: Secondary | ICD-10-CM | POA: Diagnosis not present

## 2018-07-01 DIAGNOSIS — M2550 Pain in unspecified joint: Secondary | ICD-10-CM | POA: Diagnosis not present

## 2018-07-01 DIAGNOSIS — R0602 Shortness of breath: Secondary | ICD-10-CM | POA: Diagnosis not present

## 2018-07-01 DIAGNOSIS — Z7982 Long term (current) use of aspirin: Secondary | ICD-10-CM

## 2018-07-01 DIAGNOSIS — K449 Diaphragmatic hernia without obstruction or gangrene: Secondary | ICD-10-CM | POA: Diagnosis present

## 2018-07-01 DIAGNOSIS — R410 Disorientation, unspecified: Secondary | ICD-10-CM | POA: Diagnosis not present

## 2018-07-01 DIAGNOSIS — I4891 Unspecified atrial fibrillation: Secondary | ICD-10-CM | POA: Diagnosis not present

## 2018-07-01 DIAGNOSIS — J9601 Acute respiratory failure with hypoxia: Secondary | ICD-10-CM | POA: Diagnosis not present

## 2018-07-01 DIAGNOSIS — Z882 Allergy status to sulfonamides status: Secondary | ICD-10-CM

## 2018-07-01 DIAGNOSIS — Z9841 Cataract extraction status, right eye: Secondary | ICD-10-CM | POA: Diagnosis not present

## 2018-07-01 DIAGNOSIS — E871 Hypo-osmolality and hyponatremia: Secondary | ICD-10-CM | POA: Diagnosis not present

## 2018-07-01 DIAGNOSIS — I480 Paroxysmal atrial fibrillation: Secondary | ICD-10-CM | POA: Diagnosis not present

## 2018-07-01 DIAGNOSIS — Z66 Do not resuscitate: Secondary | ICD-10-CM | POA: Diagnosis present

## 2018-07-01 DIAGNOSIS — G629 Polyneuropathy, unspecified: Secondary | ICD-10-CM | POA: Diagnosis present

## 2018-07-01 DIAGNOSIS — J690 Pneumonitis due to inhalation of food and vomit: Secondary | ICD-10-CM | POA: Diagnosis not present

## 2018-07-01 DIAGNOSIS — Z823 Family history of stroke: Secondary | ICD-10-CM

## 2018-07-01 DIAGNOSIS — R404 Transient alteration of awareness: Secondary | ICD-10-CM | POA: Diagnosis not present

## 2018-07-01 DIAGNOSIS — J9612 Chronic respiratory failure with hypercapnia: Secondary | ICD-10-CM

## 2018-07-01 DIAGNOSIS — L8990 Pressure ulcer of unspecified site, unspecified stage: Secondary | ICD-10-CM

## 2018-07-01 LAB — COMPREHENSIVE METABOLIC PANEL
ALT: 12 U/L (ref 0–44)
AST: 26 U/L (ref 15–41)
Albumin: 2.3 g/dL — ABNORMAL LOW (ref 3.5–5.0)
Alkaline Phosphatase: 48 U/L (ref 38–126)
Anion gap: 8 (ref 5–15)
BUN: 16 mg/dL (ref 8–23)
CO2: 46 mmol/L — ABNORMAL HIGH (ref 22–32)
Calcium: 8.7 mg/dL — ABNORMAL LOW (ref 8.9–10.3)
Chloride: 72 mmol/L — ABNORMAL LOW (ref 98–111)
Creatinine, Ser: 0.59 mg/dL (ref 0.44–1.00)
GFR calc non Af Amer: >60 mL/min (ref >60)
Glucose, Bld: 84 mg/dL (ref 70–99)
Potassium: 3.4 mmol/L — ABNORMAL LOW (ref 3.5–5.1)
Sodium: 126 mmol/L — ABNORMAL LOW (ref 135–145)
Total Bilirubin: 0.8 mg/dL (ref 0.3–1.2)
Total Protein: 5.7 g/dL — ABNORMAL LOW (ref 6.5–8.1)

## 2018-07-01 LAB — BLOOD GAS, VENOUS
Acid-Base Excess: 25.3 mmol/L — ABNORMAL HIGH (ref 0.0–2.0)
BICARBONATE: 55 mmol/L — AB (ref 20.0–28.0)
O2 Saturation: 85.7 %
PCO2 VEN: 81 mmHg — AB (ref 44.0–60.0)
PH VEN: 7.44 — AB (ref 7.250–7.430)
Patient temperature: 37
pO2, Ven: 49 mmHg — ABNORMAL HIGH (ref 32.0–45.0)

## 2018-07-01 LAB — CBC WITH DIFFERENTIAL/PLATELET
Abs Immature Granulocytes: 0.05 10*3/uL (ref 0.00–0.07)
Basophils Absolute: 0 10*3/uL (ref 0.0–0.1)
Basophils Relative: 0 %
Eosinophils Absolute: 0 10*3/uL (ref 0.0–0.5)
Eosinophils Relative: 1 %
HCT: 32.1 % — ABNORMAL LOW (ref 36.0–46.0)
Hemoglobin: 10.3 g/dL — ABNORMAL LOW (ref 12.0–15.0)
Immature Granulocytes: 1 %
Lymphocytes Relative: 10 %
Lymphs Abs: 0.5 10*3/uL — ABNORMAL LOW (ref 0.7–4.0)
MCH: 28.5 pg (ref 26.0–34.0)
MCHC: 32.1 g/dL (ref 30.0–36.0)
MCV: 88.9 fL (ref 80.0–100.0)
Monocytes Absolute: 0.7 10*3/uL (ref 0.1–1.0)
Monocytes Relative: 13 %
Neutro Abs: 4.3 10*3/uL (ref 1.7–7.7)
Neutrophils Relative %: 75 %
Platelets: 167 10*3/uL (ref 150–400)
RBC: 3.61 MIL/uL — AB (ref 3.87–5.11)
RDW: 15.1 % (ref 11.5–15.5)
WBC: 5.6 10*3/uL (ref 4.0–10.5)
nRBC: 0 % (ref 0.0–0.2)

## 2018-07-01 LAB — URINALYSIS, COMPLETE (UACMP) WITH MICROSCOPIC
Bilirubin Urine: NEGATIVE
Glucose, UA: NEGATIVE mg/dL
Ketones, ur: NEGATIVE mg/dL
Nitrite: NEGATIVE
Protein, ur: 30 mg/dL — AB
RBC / HPF: >50 RBC/hpf — ABNORMAL HIGH (ref 0–5)
Specific Gravity, Urine: 1.011 (ref 1.005–1.030)
WBC, UA: >50 WBC/hpf — ABNORMAL HIGH (ref 0–5)
pH: 7 (ref 5.0–8.0)

## 2018-07-01 LAB — PROTIME-INR
INR: 1.41
Prothrombin Time: 17.1 seconds — ABNORMAL HIGH (ref 11.4–15.2)

## 2018-07-01 LAB — LACTIC ACID, PLASMA: Lactic Acid, Venous: 0.8 mmol/L (ref 0.5–1.9)

## 2018-07-01 LAB — PROCALCITONIN: Procalcitonin: <0.1 ng/mL

## 2018-07-01 LAB — BRAIN NATRIURETIC PEPTIDE: B Natriuretic Peptide: 135 pg/mL — ABNORMAL HIGH (ref 0.0–100.0)

## 2018-07-01 MED ORDER — VANCOMYCIN HCL IN DEXTROSE 1-5 GM/200ML-% IV SOLN
1000.0000 mg | Freq: Once | INTRAVENOUS | Status: AC
  Administered 2018-07-01: 1000 mg via INTRAVENOUS
  Filled 2018-07-01: qty 200

## 2018-07-01 MED ORDER — SODIUM CHLORIDE 0.9 % IV SOLN
2.0000 g | Freq: Once | INTRAVENOUS | Status: AC
  Administered 2018-07-01: 2 g via INTRAVENOUS
  Filled 2018-07-01: qty 2

## 2018-07-01 MED ORDER — SODIUM CHLORIDE 0.9 % IV SOLN
2.0000 g | Freq: Two times a day (BID) | INTRAVENOUS | Status: DC
  Administered 2018-07-02 – 2018-07-03 (×3): 2 g via INTRAVENOUS
  Filled 2018-07-01 (×4): qty 2

## 2018-07-01 MED ORDER — BUSPIRONE HCL 5 MG PO TABS
15.0000 mg | ORAL_TABLET | Freq: Once | ORAL | Status: AC
  Administered 2018-07-01: 15 mg via ORAL
  Filled 2018-07-01: qty 3

## 2018-07-01 MED ORDER — VANCOMYCIN HCL 10 G IV SOLR
1250.0000 mg | INTRAVENOUS | Status: DC
  Filled 2018-07-01: qty 1250

## 2018-07-01 MED ORDER — SODIUM CHLORIDE 0.9 % IV SOLN: 2.0000 g | Freq: Once | INTRAVENOUS | Status: DC

## 2018-07-01 MED ORDER — SODIUM CHLORIDE 0.9% FLUSH
3.0000 mL | Freq: Once | INTRAVENOUS | Status: AC
  Administered 2018-07-01: 3 mL via INTRAVENOUS

## 2018-07-01 NOTE — ED Provider Notes (Signed)
[facility] Emergency Department Provider Note  ____________________________________________  Time seen: Approximately 10:27 PM  I have reviewed the triage vital signs and the nursing notes.   HISTORY  Chief Complaint Respiratory Distress    Level 5 Caveat: Portions of the History and Physical including HPI and review of systems are unable to be completely obtained due to patient being a poor historian   HPI Vicki Morales is a 78 y.o. female with a history of anxiety, depression, dementia, COPD who was sent to the ED today due to respiratory distress.  EMS report oxygen saturation of 77% on their arrival despite 4 L nasal cannula.  They placed the patient on nonrebreather, gave 2 albuterol nebs and 1 DuoNeb during transit.  Patient complains of feeling weak, short of breath, urinary urgency and discomfort.      Past Medical History:  Diagnosis Date  . Anxiety   . Arthritis   . Cataract    right eye  . COPD (chronic obstructive pulmonary disease) ([place])   . Depression   . Dyspnea    DOE  . Dysrhythmia   . Edema    FEET/ANKLES  . GERD (gastroesophageal reflux disease)   . H/O wheezing   . History of hiatal hernia   . HOH (hard of hearing)   . Hypertension   . Incontinence of urine in female   . Orthopnea   . Pain    CHRONIC KNEE  . Pain    CHRONIC KNEE  . Paranoid disorder ([place])   . RLS (restless legs syndrome)   . Tremors of nervous system    HANDS     Patient Active Problem List   Diagnosis Date Noted  . Urinary retention   . Acute respiratory failure ([place]) 06/09/2018  . Acute and chronic respiratory failure with hypercapnia ([place]) 03/05/2018  . COPD exacerbation ([place]) 02/19/2018  . Diastolic dysfunction 50/53/9767  . Sepsis ([place]) 12/03/2017  . 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 COPD with acute exacerbation ([place]) 10/24/2017  . Nocturnal hypoxia 08/21/2016  . Restless leg syndrome 08/20/2016  . Impingement  syndrome of shoulder region 07/29/2016  . Hyponatremia 03/21/2016  . Pneumonia 03/18/2016  . History of suicide attempt 03/16/2016  . Overdose 03/16/2016  . Pulmonary hypertension ([place]) 11/11/2015  . Anxiety 09/22/2015  . Depression 09/22/2015  . Osteoarthritis 09/10/2015  . Rosacea 07/03/2015  . Breast pain 11/07/2014  . Callus of foot 11/07/2014  . CN (constipation) 11/07/2014  . Dermatitis, eczematoid 11/07/2014  . Diverticulosis of colon 11/07/2014  . Dizziness 11/07/2014  . Can't get food down 11/07/2014  . Accumulation of fluid in tissues 11/07/2014  . Fatigue 11/07/2014  . FOM (frequency of micturition) 11/07/2014  . Tension type headache 11/07/2014  . LBP (low back pain) 11/07/2014  . Episodic mood disorder ([place]) 11/07/2014  . Extreme obesity 11/07/2014  . Muscle ache 11/07/2014  . Disturbance of skin sensation 11/07/2014  . Awareness of heartbeats 11/07/2014  . Jerking 11/07/2014  . Body tinea 11/07/2014  . Essential (primary) hypertension 10/10/2014  . Moderate COPD (chronic obstructive pulmonary disease) ([place]) 04/01/2014  . Hx of obesity 04/01/2014  . CAFL (chronic airflow limitation) ([place]) 01/25/2009  . Malaise and fatigue 11/26/2008  . Aphasia 09/26/2008  . 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 GERD (gastroesophageal reflux disease) 06/07/1998  . HLD (hyperlipidemia) 06/07/1998  . OP (osteoporosis) 06/07/1998  . H/O total hysterectomy 03/08/1987  . Schizophrenia, in remission ([place]) 06/07/1978     Past  Surgical History:  Procedure Laterality Date  . ABDOMINAL HYSTERECTOMY  1988  . APPENDECTOMY  1988  . CARDIAC CATHETERIZATION    . CATARACT EXTRACTION W/PHACO Right 07/14/2016   Procedure: CATARACT EXTRACTION PHACO AND INTRAOCULAR LENS PLACEMENT ([place]) anterior vitrectomy;  Surgeon: Estill Cotta, MD;  Location: [facility] ORS;  Service: Ophthalmology;  Laterality: Right;  [place] 02:40 AP% 24.4 CDE 59.98 Fluid pack # [id number]  . CATARACT  EXTRACTION W/PHACO Left 02/16/2017   Procedure: CATARACT EXTRACTION PHACO AND INTRAOCULAR LENS PLACEMENT (IOC);  Surgeon: Estill Cotta, MD;  Location: [facility] ORS;  Service: Ophthalmology;  Laterality: Left;  Lot # [id number] H [place]: 01:11.8 AP%: 23.5 CDE: 32.09   . ESOPHAGOGASTRODUODENOSCOPY N/A 11/11/2014   Procedure: ESOPHAGOGASTRODUODENOSCOPY (EGD);  Surgeon: Josefine Class, MD;  Location: [facility] ENDOSCOPY;  Service: Endoscopy;  Laterality: N/A;  . TONSILLECTOMY AND ADENOIDECTOMY    . TRIGGER FINGER RELEASE  2004     Prior to Admission medications   Medication Sig Start Date End Date Taking? Authorizing Provider  acetaminophen (TYLENOL) 325 MG tablet Take 650 mg by mouth every 4 (four) hours as needed for mild pain or moderate pain.    [provider]  acidophilus (RISAQUAD) CAPS capsule Take 1 capsule by mouth 3 (three) times daily with meals. 02/24/18   Salary, Avel Peace, MD  albuterol (PROVENTIL) (2.5 MG/3ML) 0.083% nebulizer solution Take 3 mLs (2.5 mg total) by nebulization 2 (two) times daily as needed for wheezing or shortness of breath. 09/22/16   Birdie Sons, MD  amiodarone (PACERONE) 200 MG tablet Take 1 tablet (200 mg total) by mouth 2 (two) times daily. 02/24/18   Salary, Avel Peace, MD  apixaban (ELIQUIS) 5 MG TABS tablet Take 1 tablet (5 mg total) by mouth 2 (two) times daily. 02/24/18   Salary, Holly Bodily D, MD  aspirin EC 81 MG EC tablet Take 1 tablet (81 mg total) by mouth daily. 02/25/18   Salary, Holly Bodily D, MD  budesonide (PULMICORT) 0.5 MG/2ML nebulizer solution Take 2 mLs (0.5 mg total) by nebulization 2 (two) times daily. 03/20/18   Epifanio Lesches, MD  busPIRone (BUSPAR) 10 MG tablet Take 1.5 tablets (15 mg total) by mouth 3 (three) times daily for 15 days. 06/23/18 07/08/18  Saundra Shelling, MD  Calcium Carbonate-Vitamin D (CALCIUM 600+D) 600-400 MG-UNIT tablet Take 1 tablet by mouth 2 (two) times daily.     [provider]  clonazePAM (KLONOPIN) 0.25  MG disintegrating tablet Take 1 tablet (0.25 mg total) by mouth 2 (two) times daily for 15 days. 06/23/18 07/08/18  Saundra Shelling, MD  Cranberry 250 MG CAPS Take 1 capsule by mouth daily.     [provider]  cyanocobalamin 1000 MCG tablet Take 1,000 mcg by mouth daily.    [provider]  digoxin (LANOXIN) 0.125 MG tablet Take 0.5 tablets (0.0625 mg total) by mouth daily. 02/24/18   Salary, Avel Peace, MD  divalproex (DEPAKOTE ER) 250 MG 24 hr tablet Take 250 mg by mouth 2 (two) times daily.    [provider]  feeding supplement, ENSURE ENLIVE, (ENSURE ENLIVE) LIQD Take 237 mLs by mouth 2 (two) times daily between meals. 02/24/18   Salary, Avel Peace, MD  fluticasone (FLONASE) 50 MCG/ACT nasal spray Place 1 spray into both nostrils daily.    [provider]  Fluticasone-Umeclidin-Vilant (TRELEGY ELLIPTA) 100-62.5-25 MCG/INH AEPB Inhale 1 puff into the lungs daily. 11/08/17   Birdie Sons, MD  furosemide (LASIX) 40 MG tablet Take 40 mg by mouth daily.  [provider]  gabapentin (NEURONTIN) 100 MG capsule Take 3 capsules (300 mg total) by mouth 2 (two) times daily. 06/23/18   Saundra Shelling, MD  guaiFENesin (MUCINEX) 600 MG 12 hr tablet Take 1 tablet (600 mg total) by mouth 2 (two) times daily. 02/24/18   Salary, Avel Peace, MD  loperamide (IMODIUM) 2 MG capsule Take 1 capsule (2 mg total) by mouth as needed for diarrhea or loose stools. 02/24/18   Salary, Avel Peace, MD  magnesium oxide (MAG-OX) 400 (241.3 Mg) MG tablet Take 1 tablet (400 mg total) by mouth 2 (two) times daily. 03/20/18   Epifanio Lesches, MD  meclizine (ANTIVERT) 12.5 MG tablet Take 12.5 mg by mouth 2 (two) times daily as needed for dizziness.    [provider]  Melatonin 10 MG CAPS Take 10 mg by mouth at bedtime.     [provider]  midodrine (PROAMATINE) 10 MG tablet Take 1 tablet (10 mg total) by mouth 3 (three) times daily with meals. 03/20/18   Epifanio Lesches, MD  montelukast (SINGULAIR) 10 MG tablet TAKE 1 TABLET(10 MG) BY MOUTH EVERY NIGHT Patient taking differently: Take 10 mg by mouth at bedtime.  01/29/18   Birdie Sons, MD  Multiple Vitamin (MULTIVITAMIN WITH MINERALS) TABS tablet Take 1 tablet by mouth daily.    [provider]  omega-3 acid ethyl esters (LOVAZA) 1 g capsule Take 1 capsule (1 g total) by mouth daily. 03/20/18   Epifanio Lesches, MD  protein supplement shake (PREMIER PROTEIN) LIQD Take 325 mLs (11 oz total) by mouth 2 (two) times daily between meals. 03/20/18   Epifanio Lesches, MD  risperiDONE (RISPERDAL) 1 MG tablet Take 1 tablet (1 mg total) by mouth at bedtime. 12/09/17   Ursula Alert, MD  Saccharomyces boulardii (PROBIOTIC) 250 MG CAPS Take 1 capsule by mouth 3 times/day as needed-between meals & bedtime. Patient taking differently: Take 1 capsule by mouth 4 (four) times daily -  with meals and at bedtime.  02/09/18   Merlyn Lot, MD  theophylline (THEO-24) 200 MG 24 hr capsule Take 1 capsule (200 mg total) by mouth daily. 12/07/17   Birdie Sons, MD  vitamin C (VITAMIN C) 250 MG tablet Take 1 tablet (250 mg total) by mouth 2 (two) times daily. 03/20/18   Epifanio Lesches, MD     Allergies Arthrotec [diclofenac-misoprostol]; Codeine; Diclofenac; Hydrochlorothiazide; Penicillins; Sulfa antibiotics; Tiotropium bromide monohydrate; Tylenol [acetaminophen]; Morphine; and Pantoprazole   Family History  Problem Relation Age of Onset  . Breast cancer Sister   . Stroke Father   . Emphysema Father   . Anxiety disorder Father   . Depression Father   . Anxiety disorder Brother     Social History Social History   Tobacco Use  . Smoking status: Former Smoker    Years: 30.00  . Smokeless tobacco: Never Used  . Tobacco comment: quit in 1989  Substance Use Topics  . Alcohol use: No    Alcohol/week: 0.0 standard drinks  . Drug use: No    Review of Systems Level 5 Caveat:  Portions of the History and Physical including HPI and review of systems are unable to be completely obtained due to patient being a poor historian   Constitutional:   No known fever.  ENT: Nasal congestion. Cardiovascular:   No chest pain or syncope. Respiratory:   Positive shortness of breath and cough. Gastrointestinal:   Negative for abdominal pain, vomiting and diarrhea.  Musculoskeletal:   Negative for  focal pain or swelling ____________________________________________   PHYSICAL EXAM:  VITAL SIGNS: ED Triage Vitals  Enc Vitals Group     BP 07/01/18 2041 123/74     Pulse Rate 07/01/18 2041 86     Resp 07/01/18 2041 20     Temp 07/01/18 2041 98.1 F (36.7 C)     Temp Source 07/01/18 2041 Axillary     SpO2 07/01/18 2041 100 %     Weight 07/01/18 2043 250 lb (113.4 kg)     Height 07/01/18 2043 5\' 5"  (1.651 m)     Head Circumference --      Peak Flow --      Pain Score --      Pain Loc --      Pain Edu? --      Excl. in [place]? --     Vital signs reviewed, nursing assessments reviewed.   Constitutional:   Awake, not alert, not oriented.  Ill-appearing Eyes:   Conjunctivae are normal. EOMI. PERRL. ENT      Head:   Normocephalic and atraumatic.      Nose:   No congestion/rhinnorhea.       Mouth/Throat:   Dry mucous membranes, no pharyngeal erythema. No peritonsillar mass.       Neck:   No meningismus. Full ROM. Hematological/Lymphatic/Immunilogical:   No cervical lymphadenopathy. Cardiovascular:   Irregularly irregular rhythm, rate of 80s. Symmetric bilateral radial and DP pulses.  No murmurs. Cap refill less than 2 seconds. Respiratory:   Normal respiratory effort without tachypnea/retractions. Breath sounds are clear and equal bilaterally. No wheezes/rales/rhonchi. Gastrointestinal:   Soft and nontender. Non distended. There is no CVA tenderness.  No rebound, rigidity, or guarding. Musculoskeletal:   Normal range of motion in all extremities. No joint effusions.  No lower  extremity tenderness.  No edema. Neurologic:   Normal speech, limited language range.  Motor grossly intact. No acute focal neurologic deficits are appreciated.  Skin:    Skin is warm, dry and intact. No rash noted.  No petechiae, purpura, or bullae.  ____________________________________________    LABS (pertinent positives/negatives) (all labs ordered are listed, but only abnormal results are displayed) Labs Reviewed  COMPREHENSIVE METABOLIC PANEL - Abnormal; Notable for the following components:      Result Value   Sodium 126 (*)    Potassium 3.4 (*)    Chloride 72 (*)    CO2 46 (*)    Calcium 8.7 (*)    Total Protein 5.7 (*)    Albumin 2.3 (*)    All other components within normal limits  CBC WITH DIFFERENTIAL/PLATELET - Abnormal; Notable for the following components:   RBC 3.61 (*)    Hemoglobin 10.3 (*)    HCT 32.1 (*)    Lymphs Abs 0.5 (*)    All other components within normal limits  PROTIME-INR - Abnormal; Notable for the following components:   Prothrombin Time 17.1 (*)    All other components within normal limits  URINALYSIS, COMPLETE (UACMP) WITH MICROSCOPIC - Abnormal; Notable for the following components:   Color, Urine YELLOW (*)    APPearance CLOUDY (*)    Hgb urine dipstick LARGE (*)    Protein, ur 30 (*)    Leukocytes, UA LARGE (*)    RBC / HPF >50 (*)    WBC, UA >50 (*)    Bacteria, UA MANY (*)    All other components within normal limits  BRAIN NATRIURETIC PEPTIDE - Abnormal; Notable for the following components:  B Natriuretic Peptide 135.0 (*)    All other components within normal limits  BLOOD GAS, VENOUS - Abnormal; Notable for the following components:   pH, Ven 7.44 (*)    pCO2, Ven 81 (*)    pO2, Ven 49.0 (*)    Bicarbonate 55.0 (*)    Acid-Base Excess 25.3 (*)    All other components within normal limits  CULTURE, BLOOD (ROUTINE X 2)  CULTURE, BLOOD (ROUTINE X 2)  MRSA PCR SCREENING  LACTIC ACID, PLASMA  PROCALCITONIN  LACTIC ACID,  PLASMA  PROCALCITONIN   ____________________________________________   EKG  Interpreted by me Atrial fibrillation rate of 97, left axis, normal intervals.  Poor R wave progression.  Normal ST segments and T waves.  ____________________________________________    DTOIZTIWP  Dg Chest Portable 1 View  Result Date: 07/01/2018 CLINICAL DATA:  Shortness of breath. EXAM: PORTABLE CHEST 1 VIEW COMPARISON:  Most recent radiograph 06/21/2018 FINDINGS: Similar cardiomegaly with unchanged mediastinal contours, tortuous atherosclerotic aorta. Increased vascular congestion with interstitial thickening suspicious for pulmonary edema. Patchy bibasilar opacities are nonspecific. Limited assessment for pleural effusion. Calcified mediastinal nodes. No pneumothorax. Chronic change about the shoulders. IMPRESSION: 1. Increased vascular congestion with interstitial thickening, suspicious for pulmonary edema. 2. Patchy bibasilar opacities are nonspecific, may be atelectasis, pneumonia, or aspiration. Electronically Signed   By: Keith Rake M.D.   On: 07/01/2018 21:39    ____________________________________________   PROCEDURES .Critical Care Performed by: Carrie Mew, MD Authorized by: Carrie Mew, MD   Critical care provider statement:    Critical care time (minutes):  35   Critical care time was exclusive of:  Separately billable procedures and treating other patients   Critical care was necessary to treat or prevent imminent or life-threatening deterioration of the following conditions:  Respiratory failure and CNS failure or compromise   Critical care was time spent personally by me on the following activities:  Development of treatment plan with patient or surrogate, discussions with consultants, evaluation of patient's response to treatment, examination of patient, obtaining history from patient or surrogate, ordering and performing treatments and interventions, ordering and review  of laboratory studies, ordering and review of radiographic studies, pulse oximetry, re-evaluation of patient's condition and review of old charts Angiocath insertion Date/Time: 07/01/2018 10:52 PM Performed by: Carrie Mew, MD Authorized by: Carrie Mew, MD  Consent: The procedure was performed in an emergent situation. Preparation: Patient was prepped and draped in the usual sterile fashion. Local anesthesia used: no  Anesthesia: Local anesthesia used: no  Sedation: Patient sedated: no  Patient tolerance: Patient tolerated the procedure well with no immediate complications     ____________________________________________  DIFFERENTIAL DIAGNOSIS   UTI, pneumonia, pulmonary edema, COPD exacerbation, H CAP  CLINICAL IMPRESSION / ASSESSMENT AND PLAN / ED COURSE  Pertinent labs & imaging results that were available during my care of the patient were reviewed by me and considered in my medical decision making (see chart for details).      Clinical Course as of Jul 01 2249  Sat Jul 01, 2018  2049 [place] PIV placed by me on arrival. C/w pna, outpt tx failure, resp distress. Will need to admit. DNR. Will be liberal with bipap if persistent hypoxia on [address]   [PS]  2155 VBG shows a PCO2 of 81 with a normal pH.  Serum bicarb is elevated as well consistent with chronic hypercapnia, compensated   [PS]  2226 UA consistent with urinary tract infection.  Chest x-ray suggestive of either aspiration  pneumonia versus pulmonary edema.  Maintaining good oxygenation on 4 L nasal cannula, cannulas placed over her mouth due to nasal occlusion/congestion.  Plan to admit for further management.   [PS]    Clinical Course User Index [PS] Carrie Mew, MD     ____________________________________________   FINAL CLINICAL IMPRESSION(S) / ED DIAGNOSES    Final diagnoses:  Chronic respiratory failure with hypoxia and hypercapnia ([place])  Failure of outpatient treatment  HCAP  (healthcare-associated pneumonia)     ED Discharge Orders    None      Portions of this note were generated with dragon dictation software. Dictation errors may occur despite best attempts at proofreading.   Carrie Mew, MD 07/01/18 2252

## 2018-07-01 NOTE — ED Notes (Signed)
IV antibiotics complete; line flushed without difficulty; pt says she is comfortable; no complaints voiced; daughter at bedside-daughter provide with more ice water as requested;

## 2018-07-01 NOTE — ED Triage Notes (Signed)
Patient presents to Emergency Department from [facility] health care via EMS with complaints of SOB, per EMS pt was found at 78% on room air and gave pt NRB at 15 lpm 2 x albuterol and 1 x duoneb  Pt responding to questioning, indwelling foley, extremities edematous, cough present

## 2018-07-01 NOTE — ED Notes (Signed)
Family at bedside, daughter, [name]

## 2018-07-01 NOTE — H&P (Signed)
[facility] [place] NAME: Vicki Morales    MR#:  [id number]  DATE OF BIRTH:  1941-05-28  DATE OF ADMISSION:  07/01/2018  PRIMARY CARE PHYSICIAN: Birdie Sons, MD   REQUESTING/REFERRING PHYSICIAN: Joni Fears, MD  CHIEF COMPLAINT:   Chief Complaint  Patient presents with  . Respiratory Distress    HISTORY OF PRESENT ILLNESS:  Vicki Morales  is a 78 y.o. female who presents with chief complaint as above.  Patient presents to the ED with acute onset shortness of breath.  She has chronic respiratory failure on oxygen normally, and has been treated for the last several days for pneumonia at the facility where she resides.  However, today she became acutely more short of breath and was somewhat hypoxic as well.  She was brought to the ED for evaluation.  Here she is found to have a UTI as well.  She is having significant amount of wheezing.  She is also had increased edema recently and her x-ray shows some edema in addition to her pneumonia.  Hospitalist were called for admission and further treatment  PAST MEDICAL HISTORY:   Past Medical History:  Diagnosis Date  . Anxiety   . Arthritis   . Cataract    right eye  . COPD (chronic obstructive pulmonary disease) ([place])   . Depression   . Dyspnea    DOE  . Dysrhythmia   . Edema    FEET/ANKLES  . GERD (gastroesophageal reflux disease)   . H/O wheezing   . History of hiatal hernia   . HOH (hard of hearing)   . Hypertension   . Incontinence of urine in female   . Orthopnea   . Pain    CHRONIC KNEE  . Pain    CHRONIC KNEE  . Paranoid disorder ([place])   . RLS (restless legs syndrome)   . Tremors of nervous system    HANDS     PAST SURGICAL HISTORY:   Past Surgical History:  Procedure Laterality Date  . ABDOMINAL HYSTERECTOMY  1988  . APPENDECTOMY  1988  . CARDIAC CATHETERIZATION    . CATARACT EXTRACTION W/PHACO Right 07/14/2016   Procedure: CATARACT EXTRACTION PHACO AND  INTRAOCULAR LENS PLACEMENT (National) anterior vitrectomy;  Surgeon: Estill Cotta, MD;  Location: [facility] ORS;  Service: Ophthalmology;  Laterality: Right;  [place] 02:40 AP% 24.4 CDE 59.98 Fluid pack # [id number]  . CATARACT EXTRACTION W/PHACO Left 02/16/2017   Procedure: CATARACT EXTRACTION PHACO AND INTRAOCULAR LENS PLACEMENT (IOC);  Surgeon: Estill Cotta, MD;  Location: [facility] ORS;  Service: Ophthalmology;  Laterality: Left;  Lot # [id number] H [place]: 01:11.8 AP%: 23.5 CDE: 32.09   . ESOPHAGOGASTRODUODENOSCOPY N/A 11/11/2014   Procedure: ESOPHAGOGASTRODUODENOSCOPY (EGD);  Surgeon: Josefine Class, MD;  Location: [facility] ENDOSCOPY;  Service: Endoscopy;  Laterality: N/A;  . TONSILLECTOMY AND ADENOIDECTOMY    . TRIGGER FINGER RELEASE  2004     SOCIAL HISTORY:   Social History   Tobacco Use  . Smoking status: Former Smoker    Years: 30.00  . Smokeless tobacco: Never Used  . Tobacco comment: quit in 1989  Substance Use Topics  . Alcohol use: No    Alcohol/week: 0.0 standard drinks     FAMILY HISTORY:   Family History  Problem Relation Age of Onset  . Breast cancer Sister   . Stroke Father   . Emphysema Father   . Anxiety disorder Father   . Depression Father   . Anxiety disorder  Brother      DRUG ALLERGIES:   Allergies  Allergen Reactions  . Arthrotec [Diclofenac-Misoprostol] Hives  . Codeine Other (See Comments)    Headache   . Diclofenac Other (See Comments) and Hives    Pt unsure allergy  . Hydrochlorothiazide Other (See Comments)    Weakness   . Penicillins Hives    Has patient had a PCN reaction causing immediate rash, facial/tongue/throat swelling, SOB or lightheadedness with hypotension: No Has patient had a PCN reaction causing severe rash involving mucus membranes or skin necrosis: No Has patient had a PCN reaction that required hospitalization: No Has patient had a PCN reaction occurring within the last 10 years: No If all of the above answers are "NO", then may  proceed with Cephalosporin use.  . Sulfa Antibiotics Hives  . Tiotropium Bromide Monohydrate Other (See Comments)    Blurry vision   . Tylenol [Acetaminophen] Other (See Comments)    Doesn't work  . Morphine Hives and Rash  . Pantoprazole Rash    MEDICATIONS AT HOME:   Prior to Admission medications   Medication Sig Start Date End Date Taking? Authorizing Provider  acetaminophen (TYLENOL) 325 MG tablet Take 650 mg by mouth every 4 (four) hours as needed for mild pain or moderate pain.    [provider]  acidophilus (RISAQUAD) CAPS capsule Take 1 capsule by mouth 3 (three) times daily with meals. 02/24/18   Salary, Avel Peace, MD  albuterol (PROVENTIL) (2.5 MG/3ML) 0.083% nebulizer solution Take 3 mLs (2.5 mg total) by nebulization 2 (two) times daily as needed for wheezing or shortness of breath. 09/22/16   Birdie Sons, MD  amiodarone (PACERONE) 200 MG tablet Take 1 tablet (200 mg total) by mouth 2 (two) times daily. 02/24/18   Salary, Avel Peace, MD  apixaban (ELIQUIS) 5 MG TABS tablet Take 1 tablet (5 mg total) by mouth 2 (two) times daily. 02/24/18   Salary, Holly Bodily D, MD  aspirin EC 81 MG EC tablet Take 1 tablet (81 mg total) by mouth daily. 02/25/18   Salary, Holly Bodily D, MD  budesonide (PULMICORT) 0.5 MG/2ML nebulizer solution Take 2 mLs (0.5 mg total) by nebulization 2 (two) times daily. 03/20/18   Epifanio Lesches, MD  busPIRone (BUSPAR) 10 MG tablet Take 1.5 tablets (15 mg total) by mouth 3 (three) times daily for 15 days. 06/23/18 07/08/18  Saundra Shelling, MD  Calcium Carbonate-Vitamin D (CALCIUM 600+D) 600-400 MG-UNIT tablet Take 1 tablet by mouth 2 (two) times daily.     [provider]  clonazePAM (KLONOPIN) 0.25 MG disintegrating tablet Take 1 tablet (0.25 mg total) by mouth 2 (two) times daily for 15 days. 06/23/18 07/08/18  Saundra Shelling, MD  Cranberry 250 MG CAPS Take 1 capsule by mouth daily.     [provider]  cyanocobalamin 1000 MCG tablet Take  1,000 mcg by mouth daily.    [provider]  digoxin (LANOXIN) 0.125 MG tablet Take 0.5 tablets (0.0625 mg total) by mouth daily. 02/24/18   Salary, Avel Peace, MD  divalproex (DEPAKOTE ER) 250 MG 24 hr tablet Take 250 mg by mouth 2 (two) times daily.    [provider]  feeding supplement, ENSURE ENLIVE, (ENSURE ENLIVE) LIQD Take 237 mLs by mouth 2 (two) times daily between meals. 02/24/18   Salary, Avel Peace, MD  fluticasone (FLONASE) 50 MCG/ACT nasal spray Place 1 spray into both nostrils daily.    [provider]  Fluticasone-Umeclidin-Vilant (TRELEGY ELLIPTA) 100-62.5-25 MCG/INH AEPB Inhale 1  puff into the lungs daily. 11/08/17   Birdie Sons, MD  furosemide (LASIX) 40 MG tablet Take 40 mg by mouth daily.    [provider]  gabapentin (NEURONTIN) 100 MG capsule Take 3 capsules (300 mg total) by mouth 2 (two) times daily. 06/23/18   Saundra Shelling, MD  guaiFENesin (MUCINEX) 600 MG 12 hr tablet Take 1 tablet (600 mg total) by mouth 2 (two) times daily. 02/24/18   Salary, Avel Peace, MD  loperamide (IMODIUM) 2 MG capsule Take 1 capsule (2 mg total) by mouth as needed for diarrhea or loose stools. 02/24/18   Salary, Avel Peace, MD  magnesium oxide (MAG-OX) 400 (241.3 Mg) MG tablet Take 1 tablet (400 mg total) by mouth 2 (two) times daily. 03/20/18   Epifanio Lesches, MD  meclizine (ANTIVERT) 12.5 MG tablet Take 12.5 mg by mouth 2 (two) times daily as needed for dizziness.    [provider]  Melatonin 10 MG CAPS Take 10 mg by mouth at bedtime.     [provider]  midodrine (PROAMATINE) 10 MG tablet Take 1 tablet (10 mg total) by mouth 3 (three) times daily with meals. 03/20/18   Epifanio Lesches, MD  montelukast (SINGULAIR) 10 MG tablet TAKE 1 TABLET(10 MG) BY MOUTH EVERY NIGHT Patient taking differently: Take 10 mg by mouth at bedtime.  01/29/18   Birdie Sons, MD  Multiple Vitamin (MULTIVITAMIN WITH MINERALS) TABS tablet Take 1 tablet  by mouth daily.    [provider]  omega-3 acid ethyl esters (LOVAZA) 1 g capsule Take 1 capsule (1 g total) by mouth daily. 03/20/18   Epifanio Lesches, MD  protein supplement shake (PREMIER PROTEIN) LIQD Take 325 mLs (11 oz total) by mouth 2 (two) times daily between meals. 03/20/18   Epifanio Lesches, MD  risperiDONE (RISPERDAL) 1 MG tablet Take 1 tablet (1 mg total) by mouth at bedtime. 12/09/17   Ursula Alert, MD  Saccharomyces boulardii (PROBIOTIC) 250 MG CAPS Take 1 capsule by mouth 3 times/day as needed-between meals & bedtime. Patient taking differently: Take 1 capsule by mouth 4 (four) times daily -  with meals and at bedtime.  02/09/18   Merlyn Lot, MD  theophylline (THEO-24) 200 MG 24 hr capsule Take 1 capsule (200 mg total) by mouth daily. 12/07/17   Birdie Sons, MD  vitamin C (VITAMIN C) 250 MG tablet Take 1 tablet (250 mg total) by mouth 2 (two) times daily. 03/20/18   Epifanio Lesches, MD    REVIEW OF SYSTEMS:  Review of Systems  Constitutional: Negative for chills, fever, malaise/fatigue and weight loss.  HENT: Negative for ear pain, hearing loss and tinnitus.   Eyes: Negative for blurred vision, double vision, pain and redness.  Respiratory: Positive for cough, sputum production, shortness of breath and wheezing. Negative for hemoptysis.   Cardiovascular: Positive for leg swelling. Negative for chest pain, palpitations and orthopnea.  Gastrointestinal: Negative for abdominal pain, constipation, diarrhea, nausea and vomiting.  Genitourinary: Negative for dysuria, frequency and hematuria.  Musculoskeletal: Negative for back pain, joint pain and neck pain.  Skin:       No acne, rash, or lesions  Neurological: Negative for dizziness, tremors, focal weakness and weakness.  Endo/Heme/Allergies: Negative for polydipsia. Does not bruise/bleed easily.  Psychiatric/Behavioral: Negative for depression. The patient is not nervous/anxious and does not have  insomnia.      VITAL SIGNS:   Vitals:   07/01/18 2043 07/01/18 2100 07/01/18 2130 07/01/18 2200  BP:  [name] Kitchen)  151/68 (!) 152/56 (!) 122/96  Pulse:  94 79 85  Resp:  18 20 16   Temp:      TempSrc:      SpO2:  93% 93% 96%  Weight: 113.4 kg     Height: 5\' 5"  (1.651 m)      Wt Readings from Last 3 Encounters:  07/01/18 113.4 kg  06/22/18 112 kg  03/20/18 122.7 kg    PHYSICAL EXAMINATION:  Physical Exam  Vitals reviewed. Constitutional: She is oriented to person, place, and time. She appears well-developed and well-nourished. No distress.  HENT:  Head: Normocephalic and atraumatic.  Mouth/Throat: Oropharynx is clear and moist.  Eyes: Pupils are equal, round, and reactive to light. Conjunctivae and EOM are normal. No scleral icterus.  Neck: Normal range of motion. Neck supple. No JVD present. No thyromegaly present.  Cardiovascular: Normal rate, regular rhythm and intact distal pulses. Exam reveals no gallop and no friction rub.  No murmur heard. Respiratory: Effort normal. No respiratory distress. She has wheezes. She has rales.  GI: Soft. Bowel sounds are normal. She exhibits no distension. There is no abdominal tenderness.  Musculoskeletal: Normal range of motion.        General: No edema.     Comments: No arthritis, no gout  Lymphadenopathy:    She has no cervical adenopathy.  Neurological: She is alert and oriented to person, place, and time. No cranial nerve deficit.  No dysarthria, no aphasia  Skin: Skin is warm and dry. No rash noted. No erythema.  Psychiatric: She has a normal mood and affect. Her behavior is normal. Judgment and thought content normal.    LABORATORY PANEL:   CBC Recent Labs  Lab 07/01/18 2050  WBC 5.6  HGB 10.3*  HCT 32.1*  PLT 167   ------------------------------------------------------------------------------------------------------------------  Chemistries  Recent Labs  Lab 07/01/18 2050  NA 126*  K 3.4*  CL 72*  CO2 46*  GLUCOSE  84  BUN 16  CREATININE 0.59  CALCIUM 8.7*  AST 26  ALT 12  ALKPHOS 48  BILITOT 0.8   ------------------------------------------------------------------------------------------------------------------  Cardiac Enzymes No results for input(s): TROPONINI in the last 168 hours. ------------------------------------------------------------------------------------------------------------------  RADIOLOGY:  Dg Chest Portable 1 View  Result Date: 07/01/2018 CLINICAL DATA:  Shortness of breath. EXAM: PORTABLE CHEST 1 VIEW COMPARISON:  Most recent radiograph 06/21/2018 FINDINGS: Similar cardiomegaly with unchanged mediastinal contours, tortuous atherosclerotic aorta. Increased vascular congestion with interstitial thickening suspicious for pulmonary edema. Patchy bibasilar opacities are nonspecific. Limited assessment for pleural effusion. Calcified mediastinal nodes. No pneumothorax. Chronic change about the shoulders. IMPRESSION: 1. Increased vascular congestion with interstitial thickening, suspicious for pulmonary edema. 2. Patchy bibasilar opacities are nonspecific, may be atelectasis, pneumonia, or aspiration. Electronically Signed   By: Keith Rake M.D.   On: 07/01/2018 21:39    EKG:   Orders placed or performed during the hospital encounter of 07/01/18  . EKG 12-Lead  . EKG 12-Lead    IMPRESSION AND PLAN:  Principal Problem:   COPD exacerbation (HCC) -prednisone ordered, IV antibiotics as below, PRN duo nebs and antitussive, continue home medications Active Problems:   Aspiration pneumonia ([place]) -I have changed her antibiotics from Levaquin to broaden them more given outpatient failure of Levaquin to adequately treat her condition, other treatment as above   Acute on chronic respiratory failure with hypercapnia ([place]) -patient's oxygen saturation improved significantly after clearing the congestion in her nasal passage.  Blood gas shows hypercapnia, though she is chronically  compensated.  Other treatment  as above   UTI (urinary tract infection) -IV antibiotics as above, urine culture ordered   Anxiety -home dose anxiolytics   GERD (gastroesophageal reflux disease) -home dose PPI   HLD (hyperlipidemia) -Home dose antilipid  Chart review performed and case discussed with ED provider. Labs, imaging and/or ECG reviewed by provider and discussed with patient/family. Management plans discussed with the patient and/or family.  DVT PROPHYLAXIS: SubQ lovenox   GI PROPHYLAXIS:  PPI   ADMISSION STATUS: Inpatient     CODE STATUS: DNR Code Status History    Date Active Date Inactive Code Status Order ID Comments User Context   06/09/2018 [id number] 06/23/2018 2057 DNR [id number]  Saundra Shelling, MD ED   03/05/2018 1338 03/20/2018 1812 DNR [id number]  Epifanio Lesches, MD ED   02/19/2018 1709 02/26/2018 1634 DNR [id number]  Loletha Grayer, MD ED   12/03/2017 1258 12/06/2017 1847 DNR [id number]  Gladstone Lighter, MD Inpatient   10/24/2017 1844 10/26/2017 1623 DNR [id number]  Vaughan Basta, MD Inpatient   09/23/2015 0018 09/23/2015 1823 DNR [id number]  Lance Coon, MD Inpatient    Questions for Most Recent Historical Code Status (Order [id number])    Question Answer Comment   In the event of cardiac or respiratory ARREST Do not call a "code blue"    In the event of cardiac or respiratory ARREST Do not perform Intubation, CPR, defibrillation or ACLS    In the event of cardiac or respiratory ARREST Use medication by any route, position, wound care, and other measures to relive pain and suffering. May use oxygen, suction and manual treatment of airway obstruction as needed for comfort.    Comments nurse may pronounce         Advance Directive Documentation     Most Recent Value  Type of Advance Directive  Out of facility DNR (pink MOST or yellow form), Living will  Pre-existing out of facility DNR order (yellow form or pink MOST form)  Physician notified to receive inpatient  order  "MOST" Form in Place?  -      TOTAL TIME TAKING CARE OF THIS PATIENT: 45 minutes.   Ethlyn Daniels 07/01/2018, 11:47 PM  [facility]  Office  [phone]  CC: Primary care physician; Birdie Sons, MD  Note:  This document was prepared using Dragon voice recognition software and may include unintentional dictation errors.

## 2018-07-01 NOTE — ED Notes (Signed)
Pt requesting 15mg  buspar for anxiety, EDP notified, med on [facility]

## 2018-07-01 NOTE — Progress Notes (Signed)
Pharmacy Antibiotic Note  Vicki Morales is a 79 y.o. female admitted on 07/01/2018 with pneumonia.  Pharmacy has been consulted for cefepime and vancomycin dosing.  Plan: 1. Cefepime 2 gm IV Q12H 2. Vancomycin 2 gm IV x 1 in ED followed by vancomycin 1.25 gm IV Q24H.   Expected AUC: 527 SCr used: 0.91 mg/dl   Height: 5\' 5"  (165.1 cm) Weight: 250 lb (113.4 kg) IBW/kg (Calculated) : 57  Temp (24hrs), Avg:98.1 F (36.7 C), Min:98.1 F (36.7 C), Max:98.1 F (36.7 C)  No results for input(s): WBC, CREATININE, LATICACIDVEN, VANCOTROUGH, VANCOPEAK, VANCORANDOM, GENTTROUGH, GENTPEAK, GENTRANDOM, TOBRATROUGH, TOBRAPEAK, TOBRARND, AMIKACINPEAK, AMIKACINTROU, AMIKACIN in the last 168 hours.  Estimated Creatinine Clearance: 63 mL/min (by C-G formula based on SCr of 0.91 mg/dL).    Allergies  Allergen Reactions  . Arthrotec [Diclofenac-Misoprostol] Hives  . Codeine Other (See Comments)    Headache   . Diclofenac Other (See Comments) and Hives    Pt unsure allergy  . Hydrochlorothiazide Other (See Comments)    Weakness   . Penicillins Hives    Has patient had a PCN reaction causing immediate rash, facial/tongue/throat swelling, SOB or lightheadedness with hypotension: No Has patient had a PCN reaction causing severe rash involving mucus membranes or skin necrosis: No Has patient had a PCN reaction that required hospitalization: No Has patient had a PCN reaction occurring within the last 10 years: No If all of the above answers are "NO", then may proceed with Cephalosporin use.  . Sulfa Antibiotics Hives  . Tiotropium Bromide Monohydrate Other (See Comments)    Blurry vision   . Tylenol [Acetaminophen] Other (See Comments)    Doesn't work  . Morphine Hives and Rash  . Pantoprazole Rash    Antimicrobials this admission:   Dose adjustments this admission:   Microbiology results:  BCx:   UCx:    Sputum:    MRSA PCR:   Thank you for allowing pharmacy to be a part of this  patient's care.  Laural Benes, PharmD, BCPS Clinical Pharmacist 07/01/2018 9:03 PM

## 2018-07-02 ENCOUNTER — Inpatient Hospital Stay
Admit: 2018-07-02 | Discharge: 2018-07-02 | Disposition: A | Discharge status: Home / Self Care | Payer: Medicare HMO | Attending: Internal Medicine | Admitting: Internal Medicine

## 2018-07-02 LAB — BASIC METABOLIC PANEL
Anion gap: 11 (ref 5–15)
BUN: 15 mg/dL (ref 8–23)
CO2: 42 mmol/L — ABNORMAL HIGH (ref 22–32)
Calcium: 8.4 mg/dL — ABNORMAL LOW (ref 8.9–10.3)
Chloride: 73 mmol/L — ABNORMAL LOW (ref 98–111)
Creatinine, Ser: 0.59 mg/dL (ref 0.44–1.00)
GFR calc Af Amer: >60 mL/min (ref >60)
GFR calc non Af Amer: >60 mL/min (ref >60)
Glucose, Bld: 79 mg/dL (ref 70–99)
Potassium: 3.2 mmol/L — ABNORMAL LOW (ref 3.5–5.1)
Sodium: 126 mmol/L — ABNORMAL LOW (ref 135–145)

## 2018-07-02 LAB — CBC
HCT: 29.7 % — ABNORMAL LOW (ref 36.0–46.0)
Hemoglobin: 9.7 g/dL — ABNORMAL LOW (ref 12.0–15.0)
MCH: 28.7 pg (ref 26.0–34.0)
MCHC: 32.7 g/dL (ref 30.0–36.0)
MCV: 87.9 fL (ref 80.0–100.0)
PLATELETS: 159 10*3/uL (ref 150–400)
RBC: 3.38 MIL/uL — ABNORMAL LOW (ref 3.87–5.11)
RDW: 15.3 % (ref 11.5–15.5)
WBC: 5.8 10*3/uL (ref 4.0–10.5)
nRBC: 0 % (ref 0.0–0.2)

## 2018-07-02 LAB — MRSA PCR SCREENING: MRSA by PCR: NEGATIVE

## 2018-07-02 LAB — ECHOCARDIOGRAM COMPLETE
Height: 65 in
Weight: 4000 oz

## 2018-07-02 LAB — TSH: TSH: 3.22 u[IU]/mL (ref 0.350–4.500)

## 2018-07-02 LAB — MAGNESIUM: Magnesium: 1.9 mg/dL (ref 1.7–2.4)

## 2018-07-02 MED ORDER — FUROSEMIDE 10 MG/ML IJ SOLN
40.0000 mg | Freq: Two times a day (BID) | INTRAMUSCULAR | Status: DC
Start: 2018-07-02 — End: 2018-07-03
  Administered 2018-07-02 – 2018-07-03 (×2): 40 mg via INTRAVENOUS
  Filled 2018-07-02 (×2): qty 4

## 2018-07-02 MED ORDER — METHYLPREDNISOLONE SODIUM SUCC 40 MG IJ SOLR
40.0000 mg | INTRAMUSCULAR | Status: DC
  Administered 2018-07-02: 40 mg via INTRAVENOUS
  Filled 2018-07-02: qty 1

## 2018-07-02 MED ORDER — DIVALPROEX SODIUM ER 250 MG PO TB24
250.0000 mg | ORAL_TABLET | Freq: Two times a day (BID) | ORAL | Status: DC
  Administered 2018-07-02 – 2018-07-07 (×11): 250 mg via ORAL
  Filled 2018-07-02 (×13): qty 1

## 2018-07-02 MED ORDER — UMECLIDINIUM BROMIDE 62.5 MCG/INH IN AEPB
1.0000 | INHALATION_SPRAY | Freq: Every day | RESPIRATORY_TRACT | Status: DC
  Administered 2018-07-02 – 2018-07-03 (×2): 1 via RESPIRATORY_TRACT
  Filled 2018-07-02: qty 7

## 2018-07-02 MED ORDER — RISPERIDONE 1 MG PO TABS
1.0000 mg | ORAL_TABLET | Freq: Every day | ORAL | Status: DC
  Administered 2018-07-02: 1 mg via ORAL
  Filled 2018-07-02 (×2): qty 1

## 2018-07-02 MED ORDER — METHYLPREDNISOLONE SODIUM SUCC 125 MG IJ SOLR: 60.0000 mg | Freq: Three times a day (TID) | INTRAMUSCULAR | Status: DC

## 2018-07-02 MED ORDER — FLUTICASONE-UMECLIDIN-VILANT 100-62.5-25 MCG/INH IN AEPB: 1.0000 | INHALATION_SPRAY | Freq: Every day | RESPIRATORY_TRACT | Status: DC

## 2018-07-02 MED ORDER — MONTELUKAST SODIUM 10 MG PO TABS
10.0000 mg | ORAL_TABLET | Freq: Every day | ORAL | Status: DC
  Administered 2018-07-02 – 2018-07-07 (×6): 10 mg via ORAL
  Filled 2018-07-02 (×6): qty 1

## 2018-07-02 MED ORDER — METHYLPREDNISOLONE SODIUM SUCC 40 MG IJ SOLR
40.0000 mg | Freq: Two times a day (BID) | INTRAMUSCULAR | Status: DC
  Administered 2018-07-02 – 2018-07-06 (×8): 40 mg via INTRAVENOUS
  Filled 2018-07-02 (×8): qty 1

## 2018-07-02 MED ORDER — FUROSEMIDE 10 MG/ML IJ SOLN
20.0000 mg | Freq: Two times a day (BID) | INTRAMUSCULAR | Status: DC
  Administered 2018-07-02: 20 mg via INTRAVENOUS
  Filled 2018-07-02: qty 4

## 2018-07-02 MED ORDER — MELATONIN 5 MG PO TABS
10.0000 mg | ORAL_TABLET | Freq: Every day | ORAL | Status: DC
  Administered 2018-07-02 – 2018-07-06 (×5): 10 mg via ORAL
  Filled 2018-07-02 (×6): qty 2

## 2018-07-02 MED ORDER — ONDANSETRON HCL 4 MG/2ML IJ SOLN: 4.0000 mg | Freq: Four times a day (QID) | INTRAMUSCULAR | Status: DC | PRN

## 2018-07-02 MED ORDER — ALBUTEROL SULFATE (2.5 MG/3ML) 0.083% IN NEBU
INHALATION_SOLUTION | RESPIRATORY_TRACT | Status: AC
  Filled 2018-07-02: qty 3

## 2018-07-02 MED ORDER — THEOPHYLLINE ER 100 MG PO CP24
200.0000 mg | ORAL_CAPSULE | Freq: Every day | ORAL | Status: DC
  Administered 2018-07-03 – 2018-07-07 (×5): 200 mg via ORAL
  Filled 2018-07-02: qty 2
  Filled 2018-07-02: qty 1
  Filled 2018-07-02 (×3): qty 2
  Filled 2018-07-02: qty 1
  Filled 2018-07-02 (×2): qty 2

## 2018-07-02 MED ORDER — PREDNISONE 20 MG PO TABS: 40.0000 mg | ORAL_TABLET | Freq: Every day | ORAL | Status: DC

## 2018-07-02 MED ORDER — AMIODARONE HCL 200 MG PO TABS
200.0000 mg | ORAL_TABLET | Freq: Two times a day (BID) | ORAL | Status: DC
  Administered 2018-07-02 – 2018-07-07 (×11): 200 mg via ORAL
  Filled 2018-07-02 (×11): qty 1

## 2018-07-02 MED ORDER — ALBUTEROL SULFATE (2.5 MG/3ML) 0.083% IN NEBU
2.5000 mg | INHALATION_SOLUTION | Freq: Four times a day (QID) | RESPIRATORY_TRACT | Status: DC
  Administered 2018-07-02 – 2018-07-03 (×5): 2.5 mg via RESPIRATORY_TRACT
  Filled 2018-07-02 (×4): qty 3

## 2018-07-02 MED ORDER — POTASSIUM CHLORIDE CRYS ER 20 MEQ PO TBCR
30.0000 meq | EXTENDED_RELEASE_TABLET | ORAL | Status: AC
  Administered 2018-07-02: 30 meq via ORAL
  Filled 2018-07-02: qty 1

## 2018-07-02 MED ORDER — GUAIFENESIN-DM 100-10 MG/5ML PO SYRP
5.0000 mL | ORAL_SOLUTION | ORAL | Status: DC | PRN
  Administered 2018-07-02 – 2018-07-05 (×4): 5 mL via ORAL
  Filled 2018-07-02 (×4): qty 5

## 2018-07-02 MED ORDER — GABAPENTIN 300 MG PO CAPS
300.0000 mg | ORAL_CAPSULE | Freq: Two times a day (BID) | ORAL | Status: DC
  Administered 2018-07-02 – 2018-07-07 (×11): 300 mg via ORAL
  Filled 2018-07-02 (×11): qty 1

## 2018-07-02 MED ORDER — ACETAMINOPHEN 325 MG PO TABS
650.0000 mg | ORAL_TABLET | Freq: Four times a day (QID) | ORAL | Status: DC | PRN
  Administered 2018-07-05: 650 mg via ORAL
  Filled 2018-07-02: qty 2

## 2018-07-02 MED ORDER — FLUTICASONE FUROATE-VILANTEROL 100-25 MCG/INH IN AEPB
1.0000 | INHALATION_SPRAY | Freq: Every day | RESPIRATORY_TRACT | Status: DC
  Administered 2018-07-02 – 2018-07-03 (×2): 1 via RESPIRATORY_TRACT
  Filled 2018-07-02: qty 28

## 2018-07-02 MED ORDER — ASPIRIN EC 81 MG PO TBEC
81.0000 mg | DELAYED_RELEASE_TABLET | Freq: Every day | ORAL | Status: DC
  Administered 2018-07-02 – 2018-07-07 (×6): 81 mg via ORAL
  Filled 2018-07-02 (×6): qty 1

## 2018-07-02 MED ORDER — APIXABAN 5 MG PO TABS
5.0000 mg | ORAL_TABLET | Freq: Two times a day (BID) | ORAL | Status: DC
  Administered 2018-07-02 – 2018-07-07 (×11): 5 mg via ORAL
  Filled 2018-07-02 (×11): qty 1

## 2018-07-02 MED ORDER — POTASSIUM CHLORIDE CRYS ER 20 MEQ PO TBCR
40.0000 meq | EXTENDED_RELEASE_TABLET | Freq: Once | ORAL | Status: AC
  Administered 2018-07-02: 40 meq via ORAL
  Filled 2018-07-02: qty 2

## 2018-07-02 MED ORDER — CLONAZEPAM 0.125 MG PO TBDP
0.2500 mg | ORAL_TABLET | Freq: Two times a day (BID) | ORAL | Status: DC
  Administered 2018-07-02 – 2018-07-07 (×11): 0.25 mg via ORAL
  Filled 2018-07-02 (×11): qty 2

## 2018-07-02 MED ORDER — DIGOXIN 125 MCG PO TABS
0.0625 mg | ORAL_TABLET | Freq: Every day | ORAL | Status: DC
  Administered 2018-07-02 – 2018-07-07 (×6): 0.0625 mg via ORAL
  Filled 2018-07-02 (×8): qty 0.5

## 2018-07-02 MED ORDER — ENOXAPARIN SODIUM 40 MG/0.4ML ~~LOC~~ SOLN: 40.0000 mg | SUBCUTANEOUS | Status: DC

## 2018-07-02 MED ORDER — ONDANSETRON HCL 4 MG PO TABS: 4.0000 mg | ORAL_TABLET | Freq: Four times a day (QID) | ORAL | Status: DC | PRN

## 2018-07-02 MED ORDER — ACETAMINOPHEN 650 MG RE SUPP: 650.0000 mg | Freq: Four times a day (QID) | RECTAL | Status: DC | PRN

## 2018-07-02 MED ORDER — MIDODRINE HCL 5 MG PO TABS
10.0000 mg | ORAL_TABLET | Freq: Three times a day (TID) | ORAL | Status: DC
  Administered 2018-07-02 – 2018-07-07 (×16): 10 mg via ORAL
  Filled 2018-07-02 (×18): qty 2

## 2018-07-02 NOTE — Progress Notes (Signed)
*  PRELIMINARY RESULTS* Echocardiogram 2D Echocardiogram has been performed.  Vicki Morales 07/02/2018, 11:07 AM

## 2018-07-02 NOTE — Clinical Social Work Note (Signed)
Clinical Social Work Assessment  Patient Details  Name: Vicki Morales MRN: [id number] Date of Birth: Aug 06, 1940  Date of referral:  07/02/18               Reason for consult:  Facility Placement                Permission sought to share information with:  Facility Art therapist granted to share information::  Yes, Verbal Permission Granted  Name::        Agency::     Relationship::     Contact Information:     Housing/Transportation Living arrangements for the past 2 months:  [place] of Information:  Patient, Medical Team, Facility Patient Interpreter Needed:  None Criminal Activity/Legal Involvement Pertinent to Current Situation/Hospitalization:  No - Comment as needed Significant Relationships:  Adult Children, Warehouse manager Lives with:  Facility Resident Do you feel safe going back to the place where you live?  Yes Need for family participation in patient care:  No (Coment)  Care giving concerns:  Patient admitted from SNF.    Social Worker assessment / plan:  The CSW met with the patient at bedside to discuss discharge planning. The patient gave verbal permission to discuss care with the facility and her family. The CSW missed the patient daughter and did not reach by phone.   Vicki Morales at [facility] confirmed that the patient is a long term care resident and can return when stable. Currently, the patient is on 6L o2 and SLP consult is pending due to possible aspiration pneumonia and need for swallow study. The patient has active anxiety which is managed by Buspar.  Per MD note, the plan is for discharge in 3-5 days dependent on medical stability. CSW will follow for discharge facilitation and ongoing care.  Employment status:  Retired Forensic scientist:  Medicaid In [place], [employer] PT Recommendations:  Not assessed at this time Information / Referral to community resources:     Patient/Family's  Response to care:  The patient thanked the CSW.  Patient/Family's Understanding of and Emotional Response to Diagnosis, Current Treatment, and Prognosis:  The patient's prognosis is poor at this time. Palliative care is pending. The family is involved and attentive.  Emotional Assessment Appearance:  Appears stated age Attitude/Demeanor/Rapport:  Apprehensive Affect (typically observed):  Anxious Orientation:  Oriented to Self, Oriented to Place, Oriented to  Time, Oriented to Situation Alcohol / Substance use:  Never Used Psych involvement (Current and /or in the community):  No (Comment)  Discharge Needs  Concerns to be addressed:  Discharge Planning Concerns, Care Coordination Readmission within the last 30 days:  Yes Current discharge risk:  Chronically ill, Physical Impairment Barriers to Discharge:  Continued Medical Work up   [employer], LCSW 07/02/2018, 3:12 PM

## 2018-07-02 NOTE — Progress Notes (Signed)
Weaned fio2 back to 35% via venturi mask

## 2018-07-02 NOTE — Progress Notes (Signed)
Family Meeting Note  Advance Directive:yes  Today a meeting took place with the Patient.  The following clinical team members were present during this meeting:MD RN  The following were discussed:Patient's diagnosis: Acute on chronic hypoxic respiratory failure in the setting of pneumonia, CHF exacerbation and COPD exacerbation, Patient's progosis: Unable to determine and Goals for treatment: DNR  Additional follow-up to be provided: Patient is DNR.  Healthcare power of attorney is her daughter.  No changes needed at this time.  DNR yellow sheet is filled out and in patient's chart  Time spent during discussion: 16 minutes  Vicki Leibensperger, MD

## 2018-07-02 NOTE — Consult Note (Signed)
PHARMACY CONSULT NOTE - FOLLOW UP  Pharmacy Consult for Electrolyte Monitoring and Replacement   Recent Labs: Potassium (mmol/L)  Date Value  07/02/2018 3.2 (L)  09/25/2014 3.7   Magnesium (mg/dL)  Date Value  07/02/2018 1.9   Calcium (mg/dL)  Date Value  07/02/2018 8.4 (L)   Calcium, Total (mg/dL)  Date Value  09/25/2014 9.5   Albumin (g/dL)  Date Value  07/01/2018 2.3 (L)  08/20/2016 3.8  09/25/2014 4.2   Phosphorus (mg/dL)  Date Value  08/20/2016 2.8   Sodium (mmol/L)  Date Value  07/02/2018 126 (L)  08/20/2016 137  09/25/2014 137   Assessment: Pharmacy consulted for electrolyte monitoring and replacement in 78 yo female admitted with AECOPD, PNA and CHF exacerbation.   Goal of Therapy:  Electrolyte WNL  Plan:  1/26 Mg: 1.9, K:3.2.   Patient is ordered furosemide 40mg  IV every 12 hours.  Will order KCL 44mEq every 4 hours x 2 doses.   Will recheck electrolytes with AM labs and continue to replace as needed.   Pernell Dupre, PharmD, BCPS Clinical Pharmacist 07/02/2018 11:11 AM

## 2018-07-02 NOTE — ED Notes (Signed)
Back into pt's room before getting to speak with admitting provider; pt's sats noted to drop to 85%; pt had removed oxygen to face to suction secretions in her mouth; oxygen replaced; pt encouraged to take deep breaths; sats back up to 96%

## 2018-07-02 NOTE — NC FL2 (Signed)
[place] LEVEL OF CARE SCREENING TOOL     IDENTIFICATION  Patient Name: Vicki Morales Birthdate: [birth year] Sex: female Admission Date (Current Location): 07/01/2018  [place] and [place] Number:  Selena Lesser [id number] [place] and Address:  [facility], [address] [place], [place] [address]      Provider Number: [id number]  Attending Physician Name and Address:  Bettey Costa, MD  Relative Name and Phone Number:  Gennifer Potenza (Daughter) [phone]    Current Level of Care: Hospital Recommended Level of Care: [place] Prior Approval Number:    Date Approved/Denied:   PASRR Number: [id number]  Discharge Plan: SNF    Current Diagnoses: Patient Active Problem List   Diagnosis Date Noted  . UTI (urinary tract infection) 07/01/2018  . Urinary retention   . Acute respiratory failure ([place]) 06/09/2018  . Acute on chronic respiratory failure with hypercapnia ([place]) 03/05/2018  . COPD exacerbation ([place]) 02/19/2018  . Diastolic dysfunction 27/78/2423  . Sepsis ([place]) 12/03/2017  . Schizophrenia, undifferentiated ([place]) 11/24/2017  . COPD with acute exacerbation ([place]) 10/24/2017  . Nocturnal hypoxia 08/21/2016  . Restless leg syndrome 08/20/2016  . Impingement syndrome of shoulder region 07/29/2016  . Hyponatremia 03/21/2016  . Aspiration pneumonia ([place]) 03/18/2016  . History of suicide attempt 03/16/2016  . Overdose 03/16/2016  . Pulmonary hypertension ([place]) 11/11/2015  . Anxiety 09/22/2015  . Depression 09/22/2015  . Osteoarthritis 09/10/2015  . Rosacea 07/03/2015  . Breast pain 11/07/2014  . Callus of foot 11/07/2014  . CN (constipation) 11/07/2014  . Dermatitis, eczematoid 11/07/2014  . Diverticulosis of colon 11/07/2014  . Dizziness 11/07/2014  . Can't get food down 11/07/2014  . Accumulation of fluid in tissues 11/07/2014  . Fatigue 11/07/2014  . FOM (frequency of micturition) 11/07/2014  . Tension  type headache 11/07/2014  . LBP (low back pain) 11/07/2014  . Episodic mood disorder ([place]) 11/07/2014  . Extreme obesity 11/07/2014  . Muscle ache 11/07/2014  . Disturbance of skin sensation 11/07/2014  . Awareness of heartbeats 11/07/2014  . Jerking 11/07/2014  . Body tinea 11/07/2014  . Essential (primary) hypertension 10/10/2014  . Moderate COPD (chronic obstructive pulmonary disease) ([place]) 04/01/2014  . Hx of obesity 04/01/2014  . CAFL (chronic airflow limitation) ([place]) 01/25/2009  . Malaise and fatigue 11/26/2008  . Aphasia 09/26/2008  . 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 GERD (gastroesophageal reflux disease) 06/07/1998  . HLD (hyperlipidemia) 06/07/1998  . OP (osteoporosis) 06/07/1998  . H/O total hysterectomy 03/08/1987  . Schizophrenia, in remission ([place]) 06/07/1978    Orientation RESPIRATION BLADDER Height & Weight     Self, Time, Situation, Place  O2(6L o2) Indwelling catheter Weight: 250 lb (113.4 kg) Height:  5\' 5"  (165.1 cm)  BEHAVIORAL SYMPTOMS/MOOD NEUROLOGICAL BOWEL NUTRITION STATUS      Continent Diet(Heart healthy)  AMBULATORY STATUS COMMUNICATION OF NEEDS Skin   Extensive Assist Verbally Normal                       Personal Care Assistance Level of Assistance  Bathing, Feeding, Dressing Bathing Assistance: Maximum assistance Feeding assistance: Independent Dressing Assistance: Maximum assistance     Functional Limitations Info  Sight, Hearing, Speech Sight Info: Adequate Hearing Info: Adequate Speech Info: Adequate    SPECIAL CARE FACTORS FREQUENCY                       Contractures Contractures Info: Not present    Additional  Factors Info  Code Status, Allergies Code Status Info: DNR Allergies Info: Arthrotec Diclofenac-misoprostol, Codeine, Diclofenac, Hydrochlorothiazide, Penicillins, Sulfa Antibiotics, Tiotropium Bromide Monohydrate, Tylenol Acetaminophen, Morphine, Pantoprazole            Current Medications (07/02/2018):  This is the current hospital active medication list Current Facility-Administered Medications  Medication Dose Route Frequency Provider Last Rate Last Dose  . acetaminophen (TYLENOL) tablet 650 mg  650 mg Oral Q6H PRN Lance Coon, MD       Or  . acetaminophen (TYLENOL) suppository 650 mg  650 mg Rectal Q6H PRN Lance Coon, MD      . albuterol (PROVENTIL) (2.5 MG/3ML) 0.083% nebulizer solution 2.5 mg  2.5 mg Nebulization Q6H Mody, Sital, MD   2.5 mg at 07/02/18 1313  . amiodarone (PACERONE) tablet 200 mg  200 mg Oral BID Lance Coon, MD   200 mg at 07/02/18 1039  . apixaban (ELIQUIS) tablet 5 mg  5 mg Oral BID Lance Coon, MD   5 mg at 07/02/18 1038  . aspirin EC tablet 81 mg  81 mg Oral Daily Lance Coon, MD   81 mg at 07/02/18 1038  . ceFEPIme (MAXIPIME) 2 g in sodium chloride 0.9 % 100 mL IVPB  2 g Intravenous Laurence Spates, MD 200 mL/hr at 07/02/18 1114 2 g at 07/02/18 1114  . clonazePAM (KLONOPIN) disintegrating tablet 0.25 mg  0.25 mg Oral BID Lance Coon, MD   0.25 mg at 07/02/18 0520  . digoxin (LANOXIN) tablet 0.0625 mg  0.0625 mg Oral Daily Lance Coon, MD   0.0625 mg at 07/02/18 1035  . divalproex (DEPAKOTE ER) 24 hr tablet 250 mg  250 mg Oral BID Lance Coon, MD   250 mg at 07/02/18 1034  . fluticasone furoate-vilanterol (BREO ELLIPTA) 100-25 MCG/INH 1 puff  1 puff Inhalation Daily Lance Coon, MD   1 puff at 07/02/18 1037   And  . umeclidinium bromide (INCRUSE ELLIPTA) 62.5 MCG/INH 1 puff  1 puff Inhalation Daily Lance Coon, MD   1 puff at 07/02/18 1037  . furosemide (LASIX) injection 40 mg  40 mg Intravenous BID Bettey Costa, MD      . gabapentin (NEURONTIN) capsule 300 mg  300 mg Oral BID Lance Coon, MD   300 mg at 07/02/18 1039  . guaiFENesin-dextromethorphan (ROBITUSSIN DM) 100-10 MG/5ML syrup 5 mL  5 mL Oral Q4H PRN Lance Coon, MD   5 mL at 07/02/18 1034  . Melatonin TABS 10 mg  10 mg Oral QHS Lance Coon, MD       . methylPREDNISolone sodium succinate (SOLU-MEDROL) 40 mg/mL injection 40 mg  40 mg Intravenous Q12H Mody, Sital, MD      . midodrine (PROAMATINE) tablet 10 mg  10 mg Oral TID WC Lance Coon, MD   10 mg at 07/02/18 1036  . montelukast (SINGULAIR) tablet 10 mg  10 mg Oral Daily Lance Coon, MD   10 mg at 07/02/18 1038  . ondansetron (ZOFRAN) tablet 4 mg  4 mg Oral Q6H PRN Lance Coon, MD       Or  . ondansetron [facility] Novamed) injection 4 mg  4 mg Intravenous Q6H PRN Lance Coon, MD      . potassium chloride (K-DUR,KLOR-CON) CR tablet 30 mEq  30 mEq Oral Q4H Hallaji, Sheema M, RPH   30 mEq at 07/02/18 1039  . risperiDONE (RISPERDAL) tablet 1 mg  1 mg Oral QHS Lance Coon, MD      . theophylline (THEO-24) 24 hr  capsule 200 mg  200 mg Oral Daily Lance Coon, MD         Discharge Medications: Please see discharge summary for a list of discharge medications.  Relevant Imaging Results:  Relevant Lab Results:   Additional Information [id number]  Zettie Pho, LCSW

## 2018-07-02 NOTE — Progress Notes (Signed)
PT Cancellation Note  Patient Details Name: Vicki Morales MRN: [id number] DOB: 1941/02/29   Cancelled Treatment:    Reason Eval/Treat Not Completed: Patient not medically ready;Medical issues which prohibited therapy.  Spoke with RN who reports SpO2 drops below 90% even with very minimal exertion.  Will hold PT at this time and will continue to follow acutely.   Collie Siad PT, DPT 07/02/2018, 2:25 PM

## 2018-07-02 NOTE — Progress Notes (Signed)
[place] at [place] NAME: Reshunda Strider    MR#:  [id number]  DATE OF BIRTH:  1941-02-20  SUBJECTIVE:   patient here from [facility] with shortness of breath and hypoxia  Patient still having shortness of breath and wheezing  She has a chronic indwelling Foley catheter. Apparently overnight there was manipulation of them indwelling Foley catheter so she has blood in the catheter. She reports that she is mostly bedbound  REVIEW OF SYSTEMS:    Review of Systems  Constitutional: Negative for fever, chills weight loss HENT: Negative for ear pain, nosebleeds, congestion, facial swelling, rhinorrhea, neck pain, neck stiffness and ear discharge.   Respiratory: ++ for cough, shortness of breath, wheezing  Cardiovascular: Negative for chest pain, palpitations and leg swelling.  Gastrointestinal: Negative for heartburn, abdominal pain, vomiting, diarrhea or consitpation Genitourinary: Negative for dysuria, urgency, frequency, ++hematuria Musculoskeletal: Negative for back pain or joint pain Neurological: Negative for dizziness, seizures, syncope, focal weakness,  numbness and headaches.  Hematological: Does not bruise/bleed easily.  Psychiatric/Behavioral: Negative for hallucinations, confusion, dysphoric mood    Tolerating Diet: yes      DRUG ALLERGIES:   Allergies  Allergen Reactions  . Arthrotec [Diclofenac-Misoprostol] Hives  . Codeine Other (See Comments)    Headache   . Diclofenac Hives and Other (See Comments)    Reaction: unknown  . Hydrochlorothiazide Other (See Comments)    Weakness   . Penicillins Hives    Has patient had a PCN reaction causing immediate rash, facial/tongue/throat swelling, SOB or lightheadedness with hypotension: No Has patient had a PCN reaction causing severe rash involving mucus membranes or skin necrosis: No Has patient had a PCN reaction that required hospitalization: No Has patient had a  PCN reaction occurring within the last 10 years: No If all of the above answers are "NO", then may proceed with Cephalosporin use.  . Sulfa Antibiotics Hives  . Tiotropium Bromide Monohydrate Other (See Comments)    Blurry vision   . Tylenol [Acetaminophen] Other (See Comments)    Doesn't work  . Morphine Hives and Rash  . Pantoprazole Rash    VITALS:  Blood pressure (!) 135/46, pulse 65, temperature 97.8 F (36.6 C), temperature source Oral, resp. rate (!) 22, height 5\' 5"  (1.651 m), weight 113.4 kg, SpO2 93 %.  PHYSICAL EXAMINATION:  Constitutional: Appears well-developed and well-nourished. No distress. HENT: Normocephalic. [name] Kitchen Oropharynx is clear and moist.  Eyes: Conjunctivae and EOM are normal. PERRLA, no scleral icterus.  Neck: Normal ROM. Neck supple. No JVD. No tracheal deviation. CVS: RRR, S1/S2 +, no murmurs, no gallops, no carotid bruit.  Pulmonary: Increased respiratory effort with bilateral rhonchi/wheezing Abdominal: Soft. BS +,  no distension, tenderness, rebound or guarding.  Musculoskeletal: Normal range of motion.  1+ lower extremity edema and no tenderness.  Neuro: Alert. CN 2-12 grossly intact. No focal deficits. Skin: Skin is warm and dry. No rash noted. Psychiatric: Normal mood and affect.      LABORATORY PANEL:   CBC Recent Labs  Lab 07/02/18 0401  WBC 5.8  HGB 9.7*  HCT 29.7*  PLT 159   ------------------------------------------------------------------------------------------------------------------  Chemistries  Recent Labs  Lab 07/01/18 2050 07/02/18 0401  NA 126* 126*  K 3.4* 3.2*  CL 72* 73*  CO2 46* 42*  GLUCOSE 84 79  BUN 16 15  CREATININE 0.59 0.59  CALCIUM 8.7* 8.4*  AST 26  --   ALT 12  --   ALKPHOS 48  --  BILITOT 0.8  --    ------------------------------------------------------------------------------------------------------------------  Cardiac Enzymes No results for input(s): TROPONINI in the last 168  hours. ------------------------------------------------------------------------------------------------------------------  RADIOLOGY:  Dg Chest Portable 1 View  Result Date: 07/01/2018 CLINICAL DATA:  Shortness of breath. EXAM: PORTABLE CHEST 1 VIEW COMPARISON:  Most recent radiograph 06/21/2018 FINDINGS: Similar cardiomegaly with unchanged mediastinal contours, tortuous atherosclerotic aorta. Increased vascular congestion with interstitial thickening suspicious for pulmonary edema. Patchy bibasilar opacities are nonspecific. Limited assessment for pleural effusion. Calcified mediastinal nodes. No pneumothorax. Chronic change about the shoulders. IMPRESSION: 1. Increased vascular congestion with interstitial thickening, suspicious for pulmonary edema. 2. Patchy bibasilar opacities are nonspecific, may be atelectasis, pneumonia, or aspiration. Electronically Signed   By: Keith Rake M.D.   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  1.  Acute on chronic hypoxic/hypercarbic respiratory failure due to combination of COPD exacerbation,  pneumonia and CHF exacerbation After I saw patient she was placed on nonrebreather due to shortness of breath.  2.  Pneumonia with possible aspiration: Continue cefepime Speech consultation Aspiration precautions  3.  Acute exacerbation of COPD: Change prednisone to IV Solu-Medrol 40 every 12 Continue nebs and inhalers Continue theophylline 4.  Acute on chronic diastolic heart failure preserved ejection fraction: Start Lasix 40 IV twice daily Monitor intake and output Daily weight  5.  UTI: Continue cefepime and follow-up on final urine culture  6.  Chronic atrial fibrillation: Continue  amiodarone, Eliquis, digoxin  7.  Anxiety: Continue Klonopin and BuSpar  8.  Hyponatremia: This may be as a result of above issues Order TSH and follow sodium level  9.  Hypokalemia: Replete and recheck in a.m.       Management plans discussed with the patient and she is in agreement.  CODE STATUS: dnr  TOTAL TIME TAKING CARE OF THIS PATIENT: 30 minutes.     POSSIBLE D/C 3-5 days, DEPENDING ON CLINICAL CONDITION.   Tayler Lassen M.D on 07/02/2018 at 10:27 AM  Between 7am to 6pm - Pager - [phone] After 6pm go to [email or web address] - password [email or web address] [facility]  Office  [phone]  CC: Primary care physician; Birdie Sons, MD  Note: This dictation was prepared with Dragon dictation along with smaller phrase technology. Any transcriptional errors that result from this process are unintentional.

## 2018-07-02 NOTE — ED Notes (Addendum)
Pt using call bell; asked when her nerve medicine is due; explained to pt I will check admitting orders and let her know, staff is working on getting her admitted to the floor;

## 2018-07-02 NOTE — ED Notes (Addendum)
Report finished att, calling for transport  Delay for report call due in part to EMS load but also this RN unaware of room assigment

## 2018-07-02 NOTE — ED Notes (Signed)
ED TO INPATIENT HANDOFF REPORT  Name/Age/Gender Vicki Morales 78 y.o. female  Code Status Code Status History    Date Active Date Inactive Code Status Order ID Comments User Context   06/09/2018 1820 06/23/2018 2057 DNR [id number]  Saundra Shelling, MD ED   03/05/2018 1338 03/20/2018 1812 DNR [id number]  Epifanio Lesches, MD ED   02/19/2018 1709 02/26/2018 1634 DNR [id number]  Loletha Grayer, MD ED   12/03/2017 1258 12/06/2017 1847 DNR [id number]  Gladstone Lighter, MD Inpatient   10/24/2017 1844 10/26/2017 1623 DNR [id number]  Vaughan Basta, MD Inpatient   09/23/2015 0018 09/23/2015 1823 DNR [id number]  Lance Coon, MD Inpatient    Questions for Most Recent Historical Code Status (Order [id number])    Question Answer Comment   In the event of cardiac or respiratory ARREST Do not call a "code blue"    In the event of cardiac or respiratory ARREST Do not perform Intubation, CPR, defibrillation or ACLS    In the event of cardiac or respiratory ARREST Use medication by any route, position, wound care, and other measures to relive pain and suffering. May use oxygen, suction and manual treatment of airway obstruction as needed for comfort.    Comments nurse may pronounce         Advance Directive Documentation     Most Recent Value  Type of Advance Directive  Out of facility DNR (pink MOST or yellow form), Living will  Pre-existing out of facility DNR order (yellow form or pink MOST form)  Physician notified to receive inpatient order  "MOST" Form in Place?  -      Home/SNF/Other Rehab  Chief Complaint respiratory difficulty  Level of Care/Admitting Diagnosis ED Disposition    ED Disposition Condition [place]: [place] [100120]  Level of Care: Med-Surg [16]  Diagnosis: COPD exacerbation [facility]) [170017]  Admitting Physician: Lance Coon [[id number]]  Attending Physician: Jannifer [place], DAVID [phone]  Estimated length of stay: past  midnight tomorrow  Certification:: I certify this patient will need inpatient services for at least 2 midnights  PT Class (Do Not Modify): Inpatient [101]  PT Acc Code (Do Not Modify): Private [1]       Medical History Past Medical History:  Diagnosis Date  . Anxiety   . Arthritis   . Cataract    right eye  . COPD (chronic obstructive pulmonary disease) ([place])   . Depression   . Dyspnea    DOE  . Dysrhythmia   . Edema    FEET/ANKLES  . GERD (gastroesophageal reflux disease)   . H/O wheezing   . History of hiatal hernia   . HOH (hard of hearing)   . Hypertension   . Incontinence of urine in female   . Orthopnea   . Pain    CHRONIC KNEE  . Pain    CHRONIC KNEE  . Paranoid disorder ([place])   . RLS (restless legs syndrome)   . Tremors of nervous system    HANDS    Allergies Allergies  Allergen Reactions  . Arthrotec [Diclofenac-Misoprostol] Hives  . Codeine Other (See Comments)    Headache   . Diclofenac Hives and Other (See Comments)    Reaction: unknown  . Hydrochlorothiazide Other (See Comments)    Weakness   . Penicillins Hives    Has patient had a PCN reaction causing immediate rash, facial/tongue/throat swelling, SOB or lightheadedness with hypotension: No Has patient had a PCN reaction causing severe rash involving  mucus membranes or skin necrosis: No Has patient had a PCN reaction that required hospitalization: No Has patient had a PCN reaction occurring within the last 10 years: No If all of the above answers are "NO", then may proceed with Cephalosporin use.  . Sulfa Antibiotics Hives  . Tiotropium Bromide Monohydrate Other (See Comments)    Blurry vision   . Tylenol [Acetaminophen] Other (See Comments)    Doesn't work  . Morphine Hives and Rash  . Pantoprazole Rash    IV Location/Drains/Wounds Patient Lines/Drains/Airways Status   Active Line/Drains/Airways    Name:   Placement date:   Placement time:   Site:   Days:   Peripheral IV 07/01/18  Right Antecubital   07/01/18    2100    Antecubital   1   Urethral Catheter [place] Cross Double-lumen 16 Fr.   06/17/18    2322    Double-lumen   15          Labs/Imaging Results for orders placed or performed during the hospital encounter of 07/01/18 (from the past 48 hour(s))  Comprehensive metabolic panel     Status: Abnormal   Collection Time: 07/01/18  8:50 PM  Result Value Ref Range   Sodium 126 (L) 135 - 145 mmol/L   Potassium 3.4 (L) 3.5 - 5.1 mmol/L    Comment: HEMOLYSIS AT THIS LEVEL MAY AFFECT RESULT   Chloride 72 (L) 98 - 111 mmol/L   CO2 46 (H) 22 - 32 mmol/L   Glucose, Bld 84 70 - 99 mg/dL   BUN 16 8 - 23 mg/dL   Creatinine, Ser 0.59 0.44 - 1.00 mg/dL   Calcium 8.7 (L) 8.9 - 10.3 mg/dL   Total Protein 5.7 (L) 6.5 - 8.1 g/dL   Albumin 2.3 (L) 3.5 - 5.0 g/dL   AST 26 15 - 41 U/L   ALT 12 0 - 44 U/L   Alkaline Phosphatase 48 38 - 126 U/L   Total Bilirubin 0.8 0.3 - 1.2 mg/dL   GFR calc non Af Amer >60 >60 mL/min   GFR calc Af Amer >60 >60 mL/min   Anion gap 8 5 - 15    Comment: Performed at [facility], [address] [place], [place] [address]  Lactic acid, plasma     Status: None   Collection Time: 07/01/18  8:50 PM  Result Value Ref Range   Lactic Acid, Venous 0.8 0.5 - 1.9 mmol/L    Comment: Performed at [facility], [address] [place], [place] [address]  CBC with Differential     Status: Abnormal   Collection Time: 07/01/18  8:50 PM  Result Value Ref Range   WBC 5.6 4.0 - 10.5 K/uL   RBC 3.61 (L) 3.87 - 5.11 MIL/uL   Hemoglobin 10.3 (L) 12.0 - 15.0 g/dL   HCT 32.1 (L) 36.0 - 46.0 %   MCV 88.9 80.0 - 100.0 fL   MCH 28.5 26.0 - 34.0 pg   MCHC 32.1 30.0 - 36.0 g/dL   RDW 15.1 11.5 - 15.5 %   Platelets 167 150 - 400 K/uL   nRBC 0.0 0.0 - 0.2 %   Neutrophils Relative % 75 %   Neutro Abs 4.3 1.7 - 7.7 K/uL   Lymphocytes Relative 10 %   Lymphs Abs 0.5 (L) 0.7 - 4.0 K/uL   Monocytes Relative 13 %   Monocytes Absolute 0.7 0.1 -  1.0 K/uL   Eosinophils Relative 1 %   Eosinophils Absolute  0.0 0.0 - 0.5 K/uL   Basophils Relative 0 %   Basophils Absolute 0.0 0.0 - 0.1 K/uL   Immature Granulocytes 1 %   Abs Immature Granulocytes 0.05 0.00 - 0.07 K/uL    Comment: Performed at [facility], [address] [place], [place] [address]  Protime-INR     Status: Abnormal   Collection Time: 07/01/18  8:50 PM  Result Value Ref Range   Prothrombin Time 17.1 (H) 11.4 - 15.2 seconds   INR 1.41     Comment: Performed at [facility], [address] [place], [place] [address]  Procalcitonin - Baseline     Status: None   Collection Time: 07/01/18  8:50 PM  Result Value Ref Range   Procalcitonin <0.10 ng/mL    Comment:        Interpretation: PCT (Procalcitonin) <= 0.5 ng/mL: Systemic infection (sepsis) is not likely. Local bacterial infection is possible. (NOTE)       Sepsis PCT Algorithm           Lower Respiratory Tract                                      Infection PCT Algorithm    ----------------------------     ----------------------------         PCT < 0.25 ng/mL                PCT < 0.10 ng/mL         Strongly encourage             Strongly discourage   discontinuation of antibiotics    initiation of antibiotics    ----------------------------     -----------------------------       PCT 0.25 - 0.50 ng/mL            PCT 0.10 - 0.25 ng/mL               OR       >80% decrease in PCT            Discourage initiation of                                            antibiotics      Encourage discontinuation           of antibiotics    ----------------------------     -----------------------------         PCT >= 0.50 ng/mL              PCT 0.26 - 0.50 ng/mL               AND        <80% decrease in PCT             Encourage initiation of                                             antibiotics       Encourage continuation           of antibiotics    ----------------------------      -----------------------------        PCT >=  0.50 ng/mL                  PCT > 0.50 ng/mL               AND         increase in PCT                  Strongly encourage                                      initiation of antibiotics    Strongly encourage escalation           of antibiotics                                     -----------------------------                                           PCT <= 0.25 ng/mL                                                 OR                                        > 80% decrease in PCT                                     Discontinue / Do not initiate                                             antibiotics Performed at [facility], [address] [place], [place] [address]   Urinalysis, Complete w Microscopic     Status: Abnormal   Collection Time: 07/01/18  8:51 PM  Result Value Ref Range   Color, Urine YELLOW (A) YELLOW   APPearance CLOUDY (A) CLEAR   Specific Gravity, Urine 1.011 1.005 - 1.030   pH 7.0 5.0 - 8.0   Glucose, UA NEGATIVE NEGATIVE mg/dL   Hgb urine dipstick LARGE (A) NEGATIVE   Bilirubin Urine NEGATIVE NEGATIVE   Ketones, ur NEGATIVE NEGATIVE mg/dL   Protein, ur 30 (A) NEGATIVE mg/dL   Nitrite NEGATIVE NEGATIVE   Leukocytes, UA LARGE (A) NEGATIVE   RBC / HPF >50 (H) 0 - 5 RBC/hpf   WBC, UA >50 (H) 0 - 5 WBC/hpf   Bacteria, UA MANY (A) NONE SEEN   Squamous Epithelial / LPF 0-5 0 - 5    Comment: Performed at [facility], [address] [place], [place] [address]  Brain natriuretic peptide     Status: Abnormal   Collection Time: 07/01/18  8:51 PM  Result Value Ref Range   B Natriuretic Peptide 135.0 (H) 0.0 - 100.0 pg/mL    Comment: Performed at [facility],  [place], [place] [address]  Blood gas, venous     Status: Abnormal   Collection Time: 07/01/18  8:51 PM  Result Value Ref Range   pH, Ven 7.44 (H) 7.250 - 7.430   pCO2, Ven 81 (HH) 44.0 - 60.0 mmHg    Comment: CRITICAL  RESULT CALLED TO, READ BACK BY AND VERIFIED WITH:  Adama Ivins, RN AT 2130 ON [id number]    pO2, Ven 49.0 (H) 32.0 - 45.0 mmHg   Bicarbonate 55.0 (H) 20.0 - 28.0 mmol/L   Acid-Base Excess 25.3 (H) 0.0 - 2.0 mmol/L   O2 Saturation 85.7 %   Patient temperature 37.0    Sample type VENOUS     Comment: Performed at [facility], [address], [place], [place] [address]   Dg Chest Portable 1 View  Result Date: 07/01/2018 CLINICAL DATA:  Shortness of breath. EXAM: PORTABLE CHEST 1 VIEW COMPARISON:  Most recent radiograph 06/21/2018 FINDINGS: Similar cardiomegaly with unchanged mediastinal contours, tortuous atherosclerotic aorta. Increased vascular congestion with interstitial thickening suspicious for pulmonary edema. Patchy bibasilar opacities are nonspecific. Limited assessment for pleural effusion. Calcified mediastinal nodes. No pneumothorax. Chronic change about the shoulders. IMPRESSION: 1. Increased vascular congestion with interstitial thickening, suspicious for pulmonary edema. 2. Patchy bibasilar opacities are nonspecific, may be atelectasis, pneumonia, or aspiration. Electronically Signed   By: Keith Rake M.D.   On: 07/01/2018 21:39    Pending Labs Unresulted Labs (From admission, onward)    Start     Ordered   07/02/18 0500  Procalcitonin  Daily,   STAT     07/01/18 2100   07/01/18 2200  MRSA PCR Screening  Once,   STAT     07/01/18 2100   07/01/18 2047  Lactic acid, plasma  Now then every 2 hours,   STAT     07/01/18 2048   07/01/18 2047  Culture, blood (Routine x 2)  BLOOD CULTURE X 2,   STAT     07/01/18 2048   Signed and Held  CBC  (enoxaparin (LOVENOX)    CrCl >/= 30 ml/min)  Once,   R    Comments:  Baseline for enoxaparin therapy IF NOT ALREADY DRAWN.  Notify MD if PLT < 100 K.    Signed and Held   Signed and Held  Creatinine, serum  (enoxaparin (LOVENOX)    CrCl >/= 30 ml/min)  Once,   R    Comments:  Baseline for enoxaparin therapy IF NOT ALREADY DRAWN.     Signed and Held   Signed and Held  Creatinine, serum  (enoxaparin (LOVENOX)    CrCl >/= 30 ml/min)  Weekly,   R    Comments:  while on enoxaparin therapy    Signed and Held   Signed and Held  Basic metabolic panel  Tomorrow morning,   R     Signed and Held   Signed and Held  CBC  Tomorrow morning,   R     Signed and Held          Vitals/Pain Today's Vitals   07/01/18 2300 07/02/18 0000 07/02/18 0100 07/02/18 0130  BP: (!) 119/58 127/62 97/66 (!) 112/55  Pulse: 65 (!) 55 72 74  Resp: (!) 21 18 (!) 21 (!) 22  Temp:      TempSrc:      SpO2: 99% 97% 97% 96%  Weight:      Height:        Isolation Precautions No active isolations  Medications Medications  vancomycin (VANCOCIN) 1,250 mg in sodium chloride 0.9 % 250 mL IVPB (has no administration in time range)  ceFEPIme (MAXIPIME) 2 g in sodium chloride 0.9 % 100 mL IVPB (has no administration in time range)  furosemide (LASIX) injection 20 mg (has no administration in time range)  sodium chloride flush (NS) 0.9 % injection 3 mL (3 mLs Intravenous Given 07/01/18 2100)  vancomycin (VANCOCIN) IVPB 1000 mg/200 mL premix (0 mg Intravenous Stopped 07/01/18 2220)  ceFEPIme (MAXIPIME) 2 g in sodium chloride 0.9 % 100 mL IVPB (0 g Intravenous Stopped 07/01/18 2206)  vancomycin (VANCOCIN) IVPB 1000 mg/200 mL premix (0 mg Intravenous Stopped 07/01/18 2334)  busPIRone (BUSPAR) tablet 15 mg (15 mg Oral Given 07/01/18 2217)    Mobility non-ambulatory

## 2018-07-02 NOTE — ED Notes (Signed)
Claiborne Billings, RN on floor called for pt arriving

## 2018-07-03 DIAGNOSIS — L8990 Pressure ulcer of unspecified site, unspecified stage: Secondary | ICD-10-CM

## 2018-07-03 LAB — MAGNESIUM: MAGNESIUM: 2.1 mg/dL (ref 1.7–2.4)

## 2018-07-03 LAB — BASIC METABOLIC PANEL
ANION GAP: 8 (ref 5–15)
BUN: 22 mg/dL (ref 8–23)
CO2: 44 mmol/L — ABNORMAL HIGH (ref 22–32)
Calcium: 8.7 mg/dL — ABNORMAL LOW (ref 8.9–10.3)
Chloride: 78 mmol/L — ABNORMAL LOW (ref 98–111)
Creatinine, Ser: 0.77 mg/dL (ref 0.44–1.00)
GFR calc Af Amer: >60 mL/min (ref >60)
GFR calc non Af Amer: >60 mL/min (ref >60)
Glucose, Bld: 142 mg/dL — ABNORMAL HIGH (ref 70–99)
POTASSIUM: 4.1 mmol/L (ref 3.5–5.1)
Sodium: 130 mmol/L — ABNORMAL LOW (ref 135–145)

## 2018-07-03 LAB — CBC
HCT: 29.4 % — ABNORMAL LOW (ref 36.0–46.0)
HEMOGLOBIN: 9.3 g/dL — AB (ref 12.0–15.0)
MCH: 28.2 pg (ref 26.0–34.0)
MCHC: 31.6 g/dL (ref 30.0–36.0)
MCV: 89.1 fL (ref 80.0–100.0)
Platelets: 163 10*3/uL (ref 150–400)
RBC: 3.3 MIL/uL — ABNORMAL LOW (ref 3.87–5.11)
RDW: 15.4 % (ref 11.5–15.5)
WBC: 2.9 10*3/uL — ABNORMAL LOW (ref 4.0–10.5)
nRBC: 0 % (ref 0.0–0.2)

## 2018-07-03 LAB — URINE CULTURE

## 2018-07-03 MED ORDER — BUSPIRONE HCL 10 MG PO TABS
10.0000 mg | ORAL_TABLET | Freq: Three times a day (TID) | ORAL | Status: DC
  Administered 2018-07-03 – 2018-07-07 (×12): 10 mg via ORAL
  Filled 2018-07-03 (×14): qty 1

## 2018-07-03 MED ORDER — QUETIAPINE FUMARATE ER 50 MG PO TB24
100.0000 mg | ORAL_TABLET | Freq: Every day | ORAL | Status: DC
  Administered 2018-07-03 – 2018-07-06 (×4): 100 mg via ORAL
  Filled 2018-07-03 (×5): qty 2

## 2018-07-03 MED ORDER — BUDESONIDE 0.5 MG/2ML IN SUSP
0.5000 mg | Freq: Two times a day (BID) | RESPIRATORY_TRACT | Status: DC
  Administered 2018-07-03 – 2018-07-07 (×8): 0.5 mg via RESPIRATORY_TRACT
  Filled 2018-07-03 (×8): qty 2

## 2018-07-03 MED ORDER — IPRATROPIUM-ALBUTEROL 0.5-2.5 (3) MG/3ML IN SOLN
3.0000 mL | Freq: Four times a day (QID) | RESPIRATORY_TRACT | Status: DC
  Administered 2018-07-03 – 2018-07-07 (×15): 3 mL via RESPIRATORY_TRACT
  Filled 2018-07-03 (×15): qty 3

## 2018-07-03 MED ORDER — SODIUM CHLORIDE 0.9 % IV SOLN
1.0000 g | INTRAVENOUS | Status: AC
  Administered 2018-07-03 – 2018-07-05 (×3): 1 g via INTRAVENOUS
  Filled 2018-07-03 (×3): qty 1

## 2018-07-03 MED ORDER — FUROSEMIDE 10 MG/ML IJ SOLN: 40.0000 mg | Freq: Every day | INTRAMUSCULAR | Status: DC

## 2018-07-03 MED ORDER — SERTRALINE HCL 50 MG PO TABS
100.0000 mg | ORAL_TABLET | Freq: Every day | ORAL | Status: DC
  Administered 2018-07-04 – 2018-07-07 (×4): 100 mg via ORAL
  Filled 2018-07-03 (×4): qty 2

## 2018-07-03 NOTE — Evaluation (Signed)
Clinical/Bedside Swallow Evaluation Patient Details  Name: Vicki Morales MRN: [id number] Date of Birth: 10-Jul-1940  Today's Date: 07/03/2018 Time: SLP Start Time (ACUTE ONLY): 1113 SLP Stop Time (ACUTE ONLY): 1150 SLP Time Calculation (min) (ACUTE ONLY): 37 min  Past Medical History:  Past Medical History:  Diagnosis Date  . Anxiety   . Arthritis   . Cataract    right eye  . COPD (chronic obstructive pulmonary disease) ([place])   . Depression   . Dyspnea    DOE  . Dysrhythmia   . Edema    FEET/ANKLES  . GERD (gastroesophageal reflux disease)   . H/O wheezing   . History of hiatal hernia   . HOH (hard of hearing)   . Hypertension   . Incontinence of urine in female   . Orthopnea   . Pain    CHRONIC KNEE  . Pain    CHRONIC KNEE  . Paranoid disorder ([place])   . RLS (restless legs syndrome)   . Tremors of nervous system    HANDS   Past Surgical History:  Past Surgical History:  Procedure Laterality Date  . ABDOMINAL HYSTERECTOMY  1988  . APPENDECTOMY  1988  . CARDIAC CATHETERIZATION    . CATARACT EXTRACTION W/PHACO Right 07/14/2016   Procedure: CATARACT EXTRACTION PHACO AND INTRAOCULAR LENS PLACEMENT ([place]) anterior vitrectomy;  Surgeon: Estill Cotta, MD;  Location: [facility] ORS;  Service: Ophthalmology;  Laterality: Right;  [place] 02:40 AP% 24.4 CDE 59.98 Fluid pack # [id number]  . CATARACT EXTRACTION W/PHACO Left 02/16/2017   Procedure: CATARACT EXTRACTION PHACO AND INTRAOCULAR LENS PLACEMENT (IOC);  Surgeon: Estill Cotta, MD;  Location: [facility] ORS;  Service: Ophthalmology;  Laterality: Left;  Lot # [id number] H [place]: 01:11.8 AP%: 23.5 CDE: 32.09   . ESOPHAGOGASTRODUODENOSCOPY N/A 11/11/2014   Procedure: ESOPHAGOGASTRODUODENOSCOPY (EGD);  Surgeon: Josefine Class, MD;  Location[facility] ENDOSCOPY;  Service: Endoscopy;  Laterality: N/A;  . TONSILLECTOMY AND ADENOIDECTOMY    . TRIGGER FINGER RELEASE  2004   HPI:  Per admitting H&P, pt is a 78 y.o. female who presents with chief  complaint of respiratory distress.  Patient presents to the ED with acute onset shortness of breath.  She has chronic respiratory failure on oxygen normally, and has been treated for the last several days for pneumonia at the facility where she resides.  However, today she became acutely more short of breath and was somewhat hypoxic as well.  She was brought to the ED for evaluation.  Here she is found to have a UTI as well.  She is having significant amount of wheezing.  She is also had increased edema recently and her x-ray shows some edema in addition to her pneumonia.  Hospitalist were called for admission and further treatment   Assessment / Plan / Recommendation Clinical Impression  Pt appears to present w/ pharyngeal phase dysphagia from apparent decreased coordination between the timing of the pharyngeal swallow and breathing, likely d/t respiratory issues. Pt was admitted w/ heart healthy diet recommendations, per chart review. Upon administration of OM exam, pt did not demonstrate any signs of oral weakness (adequate tongue, lip cheek, jaw strength). ST did not note any s/s of weakness of facial weakness/asymmetry.  Pt reported that she frequently "eats/drinks too much too fast." During po trial of an ice chip, pt demonstrated overt s/s of aspiration (immediate coughing), likely d/t to lack of bolus control associated w/ mixed consistency of ice and water. When given trials of thin liquids, pt also demonstrated overt  s/s of aspiration (delayed coughing/throat clearing and shortness of breath). However, during trials of nectar thick liquids, pt demonstrated better-adequate management of bolus w/ no overt s/s of aspiration (3-4 sips, estimated 1.5oz) and better coordination of swallowing and breathing noted. Similar was noted w/ trials of puree and softened/moistened solids. During the oral phase, pt exhibited adequate rotary chewing and bolus management w/ timely A-P transfer; oral clearing and adequate  lingual sweeping achieved post swallow. Pt fed self w/ spoon. During BSE, pt often took large sips/bites, despite ST verbal cueing. Pt demonstrated self-awareness of this saying "oops I think that was too big/too much." Pt would benefit from tray set-up, assistance during meals, and verbal cues/reminders to take small, slow bites and sips. Recommended a Dysphagia level 3 (MECHANICAL soft) w/ extra butters, sauces, and gravies for moisture w/ NECTAR thick liquids; strict aspiration precautions; Pills WHOLE in puree. Pt and NSG agreed w/ recommendations. ST to follow-up regarding pt toleration of diet and offer trials of upgraded consistencies as appropriate.  SLP Visit Diagnosis: Dysphagia, pharyngeal phase (R13.13)    Aspiration Risk  Moderate aspiration risk    Diet Recommendation  Dysphagia III (MECHANICAL soft) w/ extra butters, sauces, and gravies for moisture. Heart healthy. NECTAR thick liquids- NO straws.  Medication Administration: Whole meds with puree    Other  Recommendations Recommended Consults: Other (Comment) Oral Care Recommendations: Oral care before and after PO Other Recommendations: Prohibited food (jello, ice cream, thin soups);Remove water pitcher;Have oral suction available;Clarify dietary restrictions   Follow up Recommendations Skilled Nursing facility      Frequency and Duration min 2x/week  2 weeks       Prognosis Prognosis for Safe Diet Advancement: Fair Barriers to Reach Goals: Severity of deficits      Swallow Study   General Date of Onset: 07/01/18 HPI: Per admitting H&P, pt is a 78 y.o. female who presents with chief complaint of respiratory distress.  Patient presents to the ED with acute onset shortness of breath.  She has chronic respiratory failure on oxygen normally, and has been treated for the last several days for pneumonia at the facility where she resides.  However, today she became acutely more short of breath and was somewhat hypoxic as well.   She was brought to the ED for evaluation.  Here she is found to have a UTI as well.  She is having significant amount of wheezing.  She is also had increased edema recently and her x-ray shows some edema in addition to her pneumonia.  Hospitalist were called for admission and further treatment Type of Study: Bedside Swallow Evaluation Diet Prior to this Study: Regular(heart healthy) Temperature Spikes Noted: No(2.9 WBC) Respiratory Status: Nasal cannula History of Recent Intubation: No Behavior/Cognition: Alert;Cooperative;Pleasant mood;Distractible;Requires cueing(pt admits she eats/drinks too much at a time, too fast) Oral Cavity Assessment: Dry Oral Care Completed by SLP: No Oral Cavity - Dentition: Dentures, top;Dentures, bottom(full on top, partial on bottom) Vision: Functional for self-feeding Self-Feeding Abilities: Able to feed self;Needs assist;Needs set up(assistance for compensatory cueing) Patient Positioning: Upright in bed Baseline Vocal Quality: Hoarse;Low vocal intensity Volitional Cough: Weak    Oral/Motor/Sensory Function Overall Oral Motor/Sensory Function: Within functional limits   Ice Chips Ice chips: Impaired Presentation: Spoon Pharyngeal Phase Impairments: Suspected delayed Swallow;Cough - Immediate   Thin Liquid Thin Liquid: Impaired Presentation: Cup Pharyngeal  Phase Impairments: Throat Clearing - Immediate;Cough - Immediate    Nectar Thick Nectar Thick Liquid: Within functional limits Presentation: Cup;Self Fed   Honey  Thick Honey Thick Liquid: Not tested   Puree Puree: Within functional limits Presentation: Self Fed;Spoon   Solid     Solid: Within functional limits Presentation: Self Fed;Spoon      Emeline General, Graduate Student SLP 07/03/2018,2:48 PM

## 2018-07-03 NOTE — Progress Notes (Signed)
[facility] NAME: Vicki Morales    MR#:  [id number]  DATE OF BIRTH:  03/26/1941  SUBJECTIVE:   Still having some respiratory distress and wheezing this morning.  Patient also says she feels depressed and would like to speak to a psychiatrist.  No other acute events overnight.  REVIEW OF SYSTEMS:    Review of Systems  Constitutional: Negative for chills and fever.  HENT: Negative for congestion and tinnitus.   Eyes: Negative for blurred vision and double vision.  Respiratory: Negative for cough, shortness of breath and wheezing.   Cardiovascular: Negative for chest pain, orthopnea and PND.  Gastrointestinal: Negative for abdominal pain, diarrhea, nausea and vomiting.  Genitourinary: Negative for dysuria and hematuria.  Neurological: Negative for dizziness, sensory change and focal weakness.  All other systems reviewed and are negative.   Nutrition: Dysphagia III Tolerating Diet: yes Tolerating PT: bedbound at baseline.    DRUG ALLERGIES:   Allergies  Allergen Reactions  . Arthrotec [Diclofenac-Misoprostol] Hives  . Codeine Other (See Comments)    Headache   . Diclofenac Hives and Other (See Comments)    Reaction: unknown  . Hydrochlorothiazide Other (See Comments)    Weakness   . Penicillins Hives    Has patient had a PCN reaction causing immediate rash, facial/tongue/throat swelling, SOB or lightheadedness with hypotension: No Has patient had a PCN reaction causing severe rash involving mucus membranes or skin necrosis: No Has patient had a PCN reaction that required hospitalization: No Has patient had a PCN reaction occurring within the last 10 years: No If all of the above answers are "NO", then may proceed with Cephalosporin use.  . Sulfa Antibiotics Hives  . Tiotropium Bromide Monohydrate Other (See Comments)    Blurry vision   . Tylenol [Acetaminophen] Other (See Comments)    Doesn't work  . Morphine Hives and  Rash  . Pantoprazole Rash    VITALS:  Blood pressure (!) 108/51, pulse 80, temperature 98.7 F (37.1 C), temperature source Oral, resp. rate 20, height 5\' 5"  (1.651 m), weight 108 kg, SpO2 92 %.  PHYSICAL EXAMINATION:   Physical Exam  GENERAL:  78 y.o.-year-old obese patient lying in bed in no acute distress.  EYES: Pupils equal, round, reactive to light and accommodation. No scleral icterus. Extraocular muscles intact.  HEENT: Head atraumatic, normocephalic. Oropharynx and nasopharynx clear.  NECK:  Supple, no jugular venous distention. No thyroid enlargement, no tenderness.  LUNGS: Good air entry bilaterally, diffuse rhonchi, wheezing bilaterally, negative use accessory muscles. CARDIOVASCULAR: S1, S2 normal. No murmurs, rubs, or gallops.  ABDOMEN: Soft, nontender, nondistended. Bowel sounds present. No organomegaly or mass.  EXTREMITIES: No cyanosis, clubbing or edema b/l.    NEUROLOGIC: Cranial nerves II through XII are intact. No focal Motor or sensory deficits b/l. Globally weak.   PSYCHIATRIC: The patient is alert and oriented x 3.  SKIN: No obvious rash, lesion, or ulcer.    LABORATORY PANEL:   CBC Recent Labs  Lab 07/03/18 0514  WBC 2.9*  HGB 9.3*  HCT 29.4*  PLT 163   ------------------------------------------------------------------------------------------------------------------  Chemistries  Recent Labs  Lab 07/01/18 2050  07/03/18 0514  NA 126*   < > 130*  K 3.4*   < > 4.1  CL 72*   < > 78*  CO2 46*   < > 44*  GLUCOSE 84   < > 142*  BUN 16   < > 22  CREATININE 0.59   < >  0.77  CALCIUM 8.7*   < > 8.7*  MG  --    < > 2.1  AST 26  --   --   ALT 12  --   --   ALKPHOS 48  --   --   BILITOT 0.8  --   --    < > = values in this interval not displayed.   ------------------------------------------------------------------------------------------------------------------  Cardiac Enzymes No results for input(s): TROPONINI in the last 168  hours. ------------------------------------------------------------------------------------------------------------------  RADIOLOGY:  Dg Chest Portable 1 View  Result Date: 07/01/2018 CLINICAL DATA:  Shortness of breath. EXAM: PORTABLE CHEST 1 VIEW COMPARISON:  Most recent radiograph 06/21/2018 FINDINGS: Similar cardiomegaly with unchanged mediastinal contours, tortuous atherosclerotic aorta. Increased vascular congestion with interstitial thickening suspicious for pulmonary edema. Patchy bibasilar opacities are nonspecific. Limited assessment for pleural effusion. Calcified mediastinal nodes. No pneumothorax. Chronic change about the shoulders. IMPRESSION: 1. Increased vascular congestion with interstitial thickening, suspicious for pulmonary edema. 2. Patchy bibasilar opacities are nonspecific, may be atelectasis, pneumonia, or aspiration. Electronically Signed   By: Keith Rake M.D.   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  1.  Acute on chronic hypoxic/hypercapnic respiratory failure-secondary to COPD exacerbation and underlying mild CHF. - Patient continues to have significant O2 requirements and will switch from Venti mask to high flow nasal cannula. -Continue treatment for underlying COPD with IV steroids, scheduled duo nebs we will add Pulmicort nebs.  Continue IV Lasix but will taper dose.  Wean off oxygen as tolerated.  2.  COPD exacerbation-we will continue IV steroids, scheduled duo nebs, will add Pulmicort nebs.  Wean oxygen as tolerated.  3.  CHF-acute on chronic diastolic dysfunction.  Continue IV Lasix but will taper dose. -Follow I's and O's and daily weights.  4.  History of chronic atrial  fibrillation-rate controlled.  Continue amiodarone, digoxin. -Continue Eliquis.  5.  Anxiety/Depression-continue Klonopin as needed. - pt. Says she feels quite depressed and wanted to speak to a Psychiatrist. Consult placed.   6. Neuropathy - cont. Neurontin.   All the records are reviewed and case discussed with Care Management/Social Worker. Management plans discussed with the patient, family and they are in agreement.  CODE STATUS: DNR  DVT Prophylaxis: Eliquis  TOTAL TIME TAKING CARE OF THIS PATIENT: 30 minutes.   POSSIBLE D/C IN 1-2 DAYS, DEPENDING ON CLINICAL CONDITION.   Henreitta Leber M.D on 07/03/2018 at 1:01 PM  Between 7am to 6pm - Pager - [phone]  After 6pm go to [email or web address] - Proofreader  [facility]  Office  [phone]  CC: Primary care physician; Birdie Sons, MD

## 2018-07-03 NOTE — Consult Note (Signed)
PHARMACY CONSULT NOTE - FOLLOW UP  Pharmacy Consult for Electrolyte Monitoring and Replacement   Recent Labs: Potassium (mmol/L)  Date Value  07/03/2018 4.1  09/25/2014 3.7   Magnesium (mg/dL)  Date Value  07/03/2018 2.1   Calcium (mg/dL)  Date Value  07/03/2018 8.7 (L)   Calcium, Total (mg/dL)  Date Value  09/25/2014 9.5   Albumin (g/dL)  Date Value  07/01/2018 2.3 (L)  08/20/2016 3.8  09/25/2014 4.2   Phosphorus (mg/dL)  Date Value  08/20/2016 2.8   Sodium (mmol/L)  Date Value  07/03/2018 130 (L)  08/20/2016 137  09/25/2014 137   Assessment: Pharmacy consulted for electrolyte monitoring and replacement in 78 yo female admitted with AECOPD, PNA and CHF exacerbation.   Goal of Therapy:  Electrolyte WNL  Plan:  All electrolytes wnl today: no supplementation needed at this time  Will recheck electrolytes with AM labs and continue to replace as needed.   Dallie Piles, PharmD Clinical Pharmacist 07/03/2018 8:54 AM

## 2018-07-03 NOTE — Evaluation (Signed)
Physical Therapy Evaluation Patient Details Name: Vicki Morales MRN: [id number] DOB: [birth year] Today's Date: 07/03/2018   History of Present Illness  Pt is a 78 y.o female admitted for acute on chronic hypoxic respiratory failure. Per chart review pt also has pneumonia and UTI. Pt has a history of  HTN, chronic diastolic heart failure, chronic A-fib, and dysrrhythmia.    Clinical Impression  Pt admitted with above diagnosis. Pt currently with functional limitations due to the deficits listed below (see PT Problem List). Ms. Manrique is a poor historian and provides contradictory information regarding her PLOF and home layout, no family present during evaluation.  She demonstrates significant BUE and BLE weakness but did put forth good effort with bed level therapeutic exercises.  Bed mobility and OOB mobility deferred this session due to SpO2 readings and SOB (see general comments below).  Given pt's current mobility status, recommending SNF at d/c.  Pt will benefit from skilled PT to increase their independence and safety with mobility to allow discharge to the venue listed below.      Follow Up Recommendations SNF    Equipment Recommendations  Other (comment)(TBD at next venue of care)    Recommendations for Other Services       Precautions / Restrictions Precautions Precautions: Fall;Other (comment) Precaution Comments: O2 Restrictions Weight Bearing Restrictions: No      Mobility  Bed Mobility               General bed mobility comments: unable to attempt due to significant BLE and BUE weakness and SpO2 readings   Transfers                    Ambulation/Gait                Stairs            Wheelchair Mobility    Modified Rankin (Stroke Patients Only)       Balance                                             Pertinent Vitals/Pain Pain Assessment: No/denies pain    Home Living Family/patient expects to be  discharged to:: Private residence Living Arrangements: Children(daughter ) Available Help at Discharge: Family;Available PRN/intermittently;Other (Comment)(daughter works days ) Type of Home: House Home Access: [place]: One [place]: Environmental consultant - 2 wheels;Cane - quad;Grab bars - tub/shower;Grab bars - toilet;Shower seat Additional Comments: Pt not a good historian, question reliability of information provided.     Prior Function Level of Independence: Needs assistance   Gait / Transfers Assistance Needed: Pt reported she was not ambulatory PTA and requires assistance from her daughter for mobility. But then later says she was ambulating household distances.   ADL's / Homemaking Assistance Needed: Pt states that PTA she was able to give herself a sponge bath but unable to dress, cook, clean, or toilet independently.   Comments: Pt not a good historian, question reliability of information provided.      Hand Dominance        Extremity/Trunk Assessment   Upper Extremity Assessment Upper Extremity Assessment: Generalized weakness    Lower Extremity Assessment Lower Extremity Assessment: Generalized weakness       Communication   Communication: HOH  Cognition Arousal/Alertness: Awake/alert Behavior During Therapy: WFL for tasks assessed/performed  Overall Cognitive Status: No family/caregiver present to determine baseline cognitive functioning Area of Impairment: Attention;Memory                   Current Attention Level: Selective Memory: Decreased short-term memory         General Comments: Pt unable to answer some questions regarding PLOF - states she is unsure.  Additionally, provides contradictory information regarding PLOF and home layout.       General Comments General comments (skin integrity, edema, etc.): SpO2 down as low as 91% on 8L HFNC with simple exercises in bed.  Pt becomes SOB with exercises and when performing  exercises and thus did not attempt bed mobility or OOB this session.     Exercises General Exercises - Upper Extremity Shoulder Flexion: AROM;Both;Supine;10 reps Elbow Flexion: Strengthening;Supine;Both;10 reps(against light PT resistance ) Elbow Extension: Strengthening;Supine;10 reps;Both(against light PT resistance ) General Exercises - Lower Extremity Ankle Circles/Pumps: AROM;Both;10 reps;Supine Heel Slides: AAROM;Both;10 reps;Supine Straight Leg Raises: AAROM;10 reps;Supine;Both Other Exercises Other Exercises: Demonstration and cues for pursed lip breathing between exercises which the pt demonstrates good understanding back to this therapist.    Assessment/Plan    PT Assessment Patient needs continued PT services  PT Problem List Decreased strength;Decreased range of motion;Decreased activity tolerance;Decreased balance;Decreased cognition;Decreased mobility;Obesity;Decreased knowledge of use of DME;Decreased safety awareness;Cardiopulmonary status limiting activity       PT Treatment Interventions DME instruction;Gait training;Functional mobility training;Therapeutic activities;Therapeutic exercise;Balance training;Neuromuscular re-education;Cognitive remediation;Patient/family education;Wheelchair mobility training    PT Goals (Current goals can be found in the Care Plan section)  Acute Rehab PT Goals Patient Stated Goal: to improve her breathing PT Goal Formulation: With patient Time For Goal Achievement: 07/17/18 Potential to Achieve Goals: Fair    Frequency Min 2X/week   Barriers to discharge Inaccessible home environment;Decreased caregiver support Pt residence is not WC accessible and unsure of amount of assist available at d/c.     Co-evaluation               AM-PAC PT "6 Clicks" Mobility  Outcome Measure Help needed turning from your back to your side while in a flat bed without using bedrails?: A Lot Help needed moving from lying on your back to sitting  on the side of a flat bed without using bedrails?: Total Help needed moving to and from a bed to a chair (including a wheelchair)?: Total Help needed standing up from a chair using your arms (e.g., wheelchair or bedside chair)?: Total Help needed to walk in hospital room?: Total Help needed climbing 3-5 steps with a railing? : Total 6 Click Score: 7    End of Session Equipment Utilized During Treatment: Oxygen Activity Tolerance: Other (comment);Patient limited by fatigue;Treatment limited secondary to medical complications (Comment)(Pt limited by SOB and SpO2) Patient left: in bed;with call bell/phone within reach;with bed alarm set Nurse Communication: Mobility status;Other (comment)(SpO2) PT Visit Diagnosis: Muscle weakness (generalized) (M62.81);Unsteadiness on feet (R26.81);Difficulty in walking, not elsewhere classified (R26.2)    Time: 1470-9295 PT Time Calculation (min) (ACUTE ONLY): 22 min   Charges:   PT Evaluation $PT Eval Moderate Complexity: 1 Mod PT Treatments $Therapeutic Exercise: 8-22 mins        Collie Siad PT, DPT 07/03/2018, 4:47 PM
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
## 2018-07-04 ENCOUNTER — Inpatient Hospital Stay: Payer: Medicare HMO

## 2018-07-04 LAB — BASIC METABOLIC PANEL
Anion gap: 8 (ref 5–15)
BUN: 33 mg/dL — ABNORMAL HIGH (ref 8–23)
CO2: 44 mmol/L — ABNORMAL HIGH (ref 22–32)
CREATININE: 0.9 mg/dL (ref 0.44–1.00)
Calcium: 8.5 mg/dL — ABNORMAL LOW (ref 8.9–10.3)
Chloride: 75 mmol/L — ABNORMAL LOW (ref 98–111)
GFR calc Af Amer: >60 mL/min (ref >60)
GFR calc non Af Amer: >60 mL/min (ref >60)
Glucose, Bld: 149 mg/dL — ABNORMAL HIGH (ref 70–99)
Potassium: 3.9 mmol/L (ref 3.5–5.1)
Sodium: 127 mmol/L — ABNORMAL LOW (ref 135–145)

## 2018-07-04 NOTE — Progress Notes (Signed)
  Speech Language Pathology Treatment: Dysphagia  Patient Details Name: Vicki Morales MRN: [id number] DOB: 10/13/1940 Today's Date: 07/04/2018 Time: 1250-1330 SLP Time Calculation (min) (ACUTE ONLY): 40 min  Assessment / Plan / Recommendation Clinical Impression  ST follow up w/ pt today re: swallowing and toleration of diet (Dysphagia 3- MECHANICAL soft w/ NECTAR liquids). Pt was partially reclined in bed, w/ her lunch tray partially eaten in front of her. Pt seemed confused and was disoriented to the time of day, as she thought she had just eaten breakfast, rather than lunch. Pt looked at the clock and thought it was 1100 at 1300. She expressed this concern to ST. When asked what she had for breakfast, pt could not recall.  On lunch tray, pt had eaten most-all puree consistency foods (yogurt, pudding, Magic Cup) and had also finished a nectar thick water. Pt also took 2-3 sips of V8 juice brought on tray. It appeared pt had taken 2-3 small bites of chicken, mashed potatoes, and green beans reporting that they were "harder to eat." Pt reported that the pureed consistencies felt easier to eat. Pt did not otherwise report any difficulty chewing or swallowing the foods she did consume at lunch time. During ST visit, pt took 3 small sips of nectar thick apple juice and 1 of V8 juice and ST did not note any overt s/s of aspiration (coughing, choking strangling, shortness of breath post swallow). Pt was reclined in bed upon ST departure.    HPI HPI: Per admitting H&P, pt is a 78 y.o. female who presents with chief complaint of respiratory distress.  Patient presents to the ED with acute onset shortness of breath.  She has chronic respiratory failure on oxygen normally, and has been treated for the last several days for pneumonia at the facility where she resides.  However, today she became acutely more short of breath and was somewhat hypoxic as well.  She was brought to the ED for evaluation.  Here she is  found to have a UTI as well.  She is having significant amount of wheezing.  She is also had increased edema recently and her x-ray shows some edema in addition to her pneumonia.  Hospitalist were called for admission and further treatment      SLP Plan   When asked if she thought she would benefit from having her meats and sides cut even more (Dysphagia II- MINCED), pt agreed it would be a good idea, for more complete nutrition. ST discussed giving pt a MBSS tomorrow morning to gain objective information re: test for aspiration w/ potential of upgrading pt's back to thin liquids. Pt agreed. ST reviewed aspiration precautions (sitting upright, taking small bites and sips, oral clearing) prior to leaving the patient to rest.        Recommendations  Medication Administration: Whole meds with puree                Oral Care Recommendations: Oral care before and after PO Follow up Recommendations: Skilled Nursing facility SLP Visit Diagnosis: Dysphagia, pharyngeal phase (R13.13)       [place], Graduate Student SLP 07/04/2018, 3:15 PM

## 2018-07-04 NOTE — Care Management Important Message (Signed)
Copy of signed Medicare IM left with patient in room. 

## 2018-07-04 NOTE — Progress Notes (Signed)
[facility] at [place] NAME: Danaiya Steadman    MR#:  [id number]  DATE OF BIRTH:  Sep 08, 1940  SUBJECTIVE:   Respiratory distress and wheezing has improved since yesterday.  Seen by psychiatry and started on some antidepressants.  No other acute events overnight.  REVIEW OF SYSTEMS:    Review of Systems  Constitutional: Negative for chills and fever.  HENT: Negative for congestion and tinnitus.   Eyes: Negative for blurred vision and double vision.  Respiratory: Negative for cough, shortness of breath and wheezing.   Cardiovascular: Negative for chest pain, orthopnea and PND.  Gastrointestinal: Negative for abdominal pain, diarrhea, nausea and vomiting.  Genitourinary: Negative for dysuria and hematuria.  Neurological: Negative for dizziness, sensory change and focal weakness.  All other systems reviewed and are negative.   Nutrition: Dysphagia III Tolerating Diet: yes Tolerating PT: bedbound at baseline.    DRUG ALLERGIES:   Allergies  Allergen Reactions  . Arthrotec [Diclofenac-Misoprostol] Hives  . Codeine Other (See Comments)    Headache   . Diclofenac Hives and Other (See Comments)    Reaction: unknown  . Hydrochlorothiazide Other (See Comments)    Weakness   . Penicillins Hives    Has patient had a PCN reaction causing immediate rash, facial/tongue/throat swelling, SOB or lightheadedness with hypotension: No Has patient had a PCN reaction causing severe rash involving mucus membranes or skin necrosis: No Has patient had a PCN reaction that required hospitalization: No Has patient had a PCN reaction occurring within the last 10 years: No If all of the above answers are "NO", then may proceed with Cephalosporin use.  . Sulfa Antibiotics Hives  . Tiotropium Bromide Monohydrate Other (See Comments)    Blurry vision   . Tylenol [Acetaminophen] Other (See Comments)    Doesn't work  . Morphine Hives and Rash  . Pantoprazole Rash     VITALS:  Blood pressure (!) 118/55, pulse (!) 59, temperature 97.9 F (36.6 C), resp. rate (!) 22, height 5\' 5"  (1.651 m), weight 108.1 kg, SpO2 95 %.  PHYSICAL EXAMINATION:   Physical Exam  GENERAL:  78 y.o.-year-old obese patient lying in bed in no acute distress.  EYES: Pupils equal, round, reactive to light and accommodation. No scleral icterus. Extraocular muscles intact.  HEENT: Head atraumatic, normocephalic. Oropharynx and nasopharynx clear.  NECK:  Supple, no jugular venous distention. No thyroid enlargement, no tenderness.  LUNGS: Good air entry bilaterally, diffuse rhonchi, wheezing bilaterally, negative use accessory muscles. CARDIOVASCULAR: S1, S2 normal. No murmurs, rubs, or gallops.  ABDOMEN: Soft, nontender, nondistended. Bowel sounds present. No organomegaly or mass.  EXTREMITIES: No cyanosis, clubbing or edema b/l.    NEUROLOGIC: Cranial nerves II through XII are intact. No focal Motor or sensory deficits b/l. Globally weak.   PSYCHIATRIC: The patient is alert and oriented x 3.  SKIN: No obvious rash, lesion, or ulcer.    LABORATORY PANEL:   CBC Recent Labs  Lab 07/03/18 0514  WBC 2.9*  HGB 9.3*  HCT 29.4*  PLT 163   ------------------------------------------------------------------------------------------------------------------  Chemistries  Recent Labs  Lab 07/01/18 2050  07/03/18 0514 07/04/18 0455  NA 126*   < > 130* 127*  K 3.4*   < > 4.1 3.9  CL 72*   < > 78* 75*  CO2 46*   < > 44* 44*  GLUCOSE 84   < > 142* 149*  BUN 16   < > 22 33*  CREATININE 0.59   < >  0.77 0.90  CALCIUM 8.7*   < > 8.7* 8.5*  MG  --    < > 2.1  --   AST 26  --   --   --   ALT 12  --   --   --   ALKPHOS 48  --   --   --   BILITOT 0.8  --   --   --    < > = values in this interval not displayed.   ------------------------------------------------------------------------------------------------------------------  Cardiac Enzymes No results for input(s):  TROPONINI in the last 168 hours. ------------------------------------------------------------------------------------------------------------------  RADIOLOGY:  Dg Chest Port 1 View  Result Date: 07/04/2018 CLINICAL DATA:  Wheezing. EXAM: PORTABLE CHEST 1 VIEW COMPARISON:  Multiple previous chest x-rays and a prior chest CT from 03/13/2018 FINDINGS: The cardiac silhouette, mediastinal and hilar contours are stable. Stable moderate tortuosity of the thoracic aorta. Persistent bronchitic type interstitial lung changes but overall improved lung aeration since prior study from 3 days ago. Small left effusion suspected with left basilar atelectasis. IMPRESSION: Persistent bronchitic lung changes but overall slight improved aeration. Suspect small left effusion along with overlying atelectasis. Electronically Signed   By: Marijo Sanes M.D.   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  1.  Acute on chronic hypoxic/hypercapnic respiratory failure-secondary to COPD exacerbation and underlying mild CHF. - Change from Ventimask to high flow nasal cannula today and will wean oxygen as tolerated.  Keep sats greater than 90% or higher. -Continue treatment for underlying COPD with IV steroids, scheduled duo nebs, Pulmicort nebs.   2.  COPD exacerbation-we will continue IV steroids, scheduled duo nebs, Pulmicort nebs.   - Wean oxygen as tolerated.  3.  CHF-acute on chronic diastolic dysfunction.  Improved and will d/c Lasix for now.  - CXR today showing more bronchitic changes rather than CHF.  4.  History of chronic atrial fibrillation-rate controlled.  Continue amiodarone, digoxin. -Continue Eliquis.  5.   Anxiety/Depression-continue Klonopin as needed. -Appreciate psychiatric consult yesterday.  Patient has been started on some Zoloft, Seroquel.  6. Neuropathy - cont. Neurontin.   7. UTI - cont. IV ceftriaxone and will treat for 5 days then stop.   - urine cultures consistent with contamination.    Possible d/c back to LTC once pt's O2 has been weaned off.   All the records are reviewed and case discussed with Care Management/Social Worker. Management plans discussed with the patient, family and they are in agreement.  CODE STATUS: DNR  DVT Prophylaxis: Eliquis  TOTAL TIME TAKING CARE OF THIS PATIENT: 30 minutes.   POSSIBLE D/C IN 1-2 DAYS, DEPENDING ON CLINICAL CONDITION.   Henreitta Leber M.D on 07/04/2018 at 2:27 PM  Between 7am to 6pm - Pager - [phone]  After 6pm go to [email or web address] - Proofreader  [facility]  Office  [phone]  CC: Primary care physician; Birdie Sons, MD

## 2018-07-05 LAB — BASIC METABOLIC PANEL
ANION GAP: 7 (ref 5–15)
BUN: 48 mg/dL — ABNORMAL HIGH (ref 8–23)
CO2: 43 mmol/L — ABNORMAL HIGH (ref 22–32)
Calcium: 8.5 mg/dL — ABNORMAL LOW (ref 8.9–10.3)
Chloride: 76 mmol/L — ABNORMAL LOW (ref 98–111)
Creatinine, Ser: 1.09 mg/dL — ABNORMAL HIGH (ref 0.44–1.00)
GFR calc Af Amer: 56 mL/min — ABNORMAL LOW (ref >60)
GFR calc non Af Amer: 48 mL/min — ABNORMAL LOW (ref >60)
Glucose, Bld: 158 mg/dL — ABNORMAL HIGH (ref 70–99)
Potassium: 4.7 mmol/L (ref 3.5–5.1)
Sodium: 126 mmol/L — ABNORMAL LOW (ref 135–145)

## 2018-07-05 NOTE — Progress Notes (Signed)
   07/05/18 1100  Clinical Encounter Type  Visited With Patient  Visit Type Initial  Referral From Nurse  Consult/Referral To Chaplain  Stress Factors  Patient Stress Factors Exhausted  Chaplain received a page from nurse asking if the Chaplain could visit patient, she was having end of life crisis,feeling she was ready to die. Chaplain also spoke with Director of unit, who encourages a visit with patient. Chaplain went to visit and patient was asleep, but wanted to talk with Chaplain after Chaplain introduced herself. Patiient shared her concernes for her children. Patient shared her beliefs and what she feels about husband and wife. Husband should be the head of the wife. Chaplain ask patient how did that relate to her health and what she feels is happening to her. She stated that she was not a virgin when she married her husband and she believes she is paying for something's and that is why her daughter is the way she is. Patient encouraged patient to exercise her strong belief in forgiving herself as what she believes Jesus has done. Chaplain offered prayer and patient was really happy that Chaplain offered, so we prayed.

## 2018-07-05 NOTE — Progress Notes (Signed)
Physical Therapy Treatment Patient Details Name: Vicki Morales MRN: [id number] DOB: 1940-07-01 Today's Date: 07/05/2018    History of Present Illness Pt is a 78 y.o female admitted for acute on chronic hypoxic respiratory failure. Per chart review pt also has pneumonia and UTI. Pt has a history of  HTN, chronic diastolic heart failure, chronic A-fib, and dysrrhythmia.    PT Comments    Pt in bed.  Agrees to session.  5 LPM O2 - sats 96%.  Participated in exercises as described below.  Pt was given frequent and extended rest breaks with exercises due to general fatigue with activity.  Sats however, did remain >93% during activity.  She was fatigued with effort and was unable to progress to further mobility or out of bed at this time.  Will continue to progress as tolerated.  She is motivated to participate and puts in good effort within her abilities.   Follow Up Recommendations  SNF     Equipment Recommendations       Recommendations for Other Services       Precautions / Restrictions Precautions Precautions: Fall;Other (comment) Precaution Comments: O2 Restrictions Weight Bearing Restrictions: No    Mobility  Bed Mobility               General bed mobility comments: deferred due to fatigue  Transfers                    Ambulation/Gait                 Stairs             Wheelchair Mobility    Modified Rankin (Stroke Patients Only)       Balance                                            Cognition Arousal/Alertness: Awake/alert Behavior During Therapy: WFL for tasks assessed/performed Overall Cognitive Status: Within Functional Limits for tasks assessed                                        Exercises Other Exercises Other Exercises: Supine ankle pumps, heel slides, ab/add and SLR x 10 BLE with support to prevetn heels from dragging on bed.    General Comments        Pertinent  Vitals/Pain Pain Assessment: No/denies pain    Home Living                      Prior Function            PT Goals (current goals can now be found in the care plan section) Progress towards PT goals: Progressing toward goals    Frequency    Min 2X/week      PT Plan Current plan remains appropriate    Co-evaluation              AM-PAC PT "6 Clicks" Mobility   Outcome Measure  Help needed turning from your back to your side while in a flat bed without using bedrails?: A Lot Help needed moving from lying on your back to sitting on the side of a flat bed without using bedrails?: Total Help needed moving to and from a bed to a chair (  including a wheelchair)?: Total Help needed standing up from a chair using your arms (e.g., wheelchair or bedside chair)?: Total Help needed to walk in hospital room?: Total Help needed climbing 3-5 steps with a railing? : Total 6 Click Score: 7    End of Session Equipment Utilized During Treatment: Oxygen   Patient left: in bed;with call bell/phone within reach;with bed alarm set         Time: 1002-1016 PT Time Calculation (min) (ACUTE ONLY): 14 min  Charges:  $Therapeutic Exercise: 8-22 mins                     Chesley Noon, PTA 07/05/18, 10:22 AM

## 2018-07-05 NOTE — Progress Notes (Signed)
[facility] at [place] NAME: Vicki Morales    MR#:  [id number]  DATE OF BIRTH:  [birth year]  SUBJECTIVE:   No acute events overnight. Wheezing/bronchospasm has improved.    REVIEW OF SYSTEMS:    Review of Systems  Constitutional: Negative for chills and fever.  HENT: Negative for congestion and tinnitus.   Eyes: Negative for blurred vision and double vision.  Respiratory: Negative for cough, shortness of breath and wheezing.   Cardiovascular: Negative for chest pain, orthopnea and PND.  Gastrointestinal: Negative for abdominal pain, diarrhea, nausea and vomiting.  Genitourinary: Negative for dysuria and hematuria.  Neurological: Negative for dizziness, sensory change and focal weakness.  All other systems reviewed and are negative.   Nutrition: Dysphagia III Tolerating Diet: yes Tolerating PT: bedbound at baseline.    DRUG ALLERGIES:   Allergies  Allergen Reactions  . Arthrotec [Diclofenac-Misoprostol] Hives  . Codeine Other (See Comments)    Headache   . Diclofenac Hives and Other (See Comments)    Reaction: unknown  . Hydrochlorothiazide Other (See Comments)    Weakness   . Penicillins Hives    Has patient had a PCN reaction causing immediate rash, facial/tongue/throat swelling, SOB or lightheadedness with hypotension: No Has patient had a PCN reaction causing severe rash involving mucus membranes or skin necrosis: No Has patient had a PCN reaction that required hospitalization: No Has patient had a PCN reaction occurring within the last 10 years: No If all of the above answers are "NO", then may proceed with Cephalosporin use.  . Sulfa Antibiotics Hives  . Tiotropium Bromide Monohydrate Other (See Comments)    Blurry vision   . Tylenol [Acetaminophen] Other (See Comments)    Doesn't work  . Morphine Hives and Rash  . Pantoprazole Rash    VITALS:  Blood pressure (!) 124/49, pulse 62, temperature (!) 97.4 F (36.3 C),  temperature source Oral, resp. rate 16, height 5\' 5"  (1.651 m), weight 108.1 kg, SpO2 96 %.  PHYSICAL EXAMINATION:   Physical Exam  GENERAL:  78 y.o.-year-old obese patient lying in bed in no acute distress.  EYES: Pupils equal, round, reactive to light and accommodation. No scleral icterus. Extraocular muscles intact.  HEENT: Head atraumatic, normocephalic. Oropharynx and nasopharynx clear.  NECK:  Supple, no jugular venous distention. No thyroid enlargement, no tenderness.  LUNGS: Good air entry bilaterally, end-exp. rhonchi, wheezing bilaterally, negative use accessory muscles. CARDIOVASCULAR: S1, S2 normal. No murmurs, rubs, or gallops.  ABDOMEN: Soft, nontender, nondistended. Bowel sounds present. No organomegaly or mass.  EXTREMITIES: No cyanosis, clubbing or edema b/l.    NEUROLOGIC: Cranial nerves II through XII are intact. No focal Motor or sensory deficits b/l. Globally weak.   PSYCHIATRIC: The patient is alert and oriented x 3.  SKIN: No obvious rash, lesion, or ulcer.    LABORATORY PANEL:   CBC Recent Labs  Lab 07/03/18 0514  WBC 2.9*  HGB 9.3*  HCT 29.4*  PLT 163   ------------------------------------------------------------------------------------------------------------------  Chemistries  Recent Labs  Lab 07/01/18 2050  07/03/18 0514  07/05/18 0323  NA 126*   < > 130*   < > 126*  K 3.4*   < > 4.1   < > 4.7  CL 72*   < > 78*   < > 76*  CO2 46*   < > 44*   < > 43*  GLUCOSE 84   < > 142*   < > 158*  BUN 16   < >  22   < > 48*  CREATININE 0.59   < > 0.77   < > 1.09*  CALCIUM 8.7*   < > 8.7*   < > 8.5*  MG  --    < > 2.1  --   --   AST 26  --   --   --   --   ALT 12  --   --   --   --   ALKPHOS 48  --   --   --   --   BILITOT 0.8  --   --   --   --    < > = values in this interval not displayed.   ------------------------------------------------------------------------------------------------------------------  Cardiac Enzymes No results for input(s):  TROPONINI in the last 168 hours. ------------------------------------------------------------------------------------------------------------------  RADIOLOGY:  Dg Chest Port 1 View  Result Date: 07/04/2018 CLINICAL DATA:  Wheezing. EXAM: PORTABLE CHEST 1 VIEW COMPARISON:  Multiple previous chest x-rays and a prior chest CT from 03/13/2018 FINDINGS: The cardiac silhouette, mediastinal and hilar contours are stable. Stable moderate tortuosity of the thoracic aorta. Persistent bronchitic type interstitial lung changes but overall improved lung aeration since prior study from 3 days ago. Small left effusion suspected with left basilar atelectasis. IMPRESSION: Persistent bronchitic lung changes but overall slight improved aeration. Suspect small left effusion along with overlying atelectasis. Electronically Signed   By: Marijo Sanes M.D.   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  1.  Acute on chronic hypoxic/hypercapnic respiratory failure-secondary to COPD exacerbation and underlying mild CHF. -Patient has been weaned from high flow nasal cannula to just 5 L nasal cannula now.  Adjust sats to 90% or higher. -Continue treatment for underlying COPD with IV steroids, scheduled duo nebs, Pulmicort nebs.   2.  COPD exacerbation-we will continue IV steroids, scheduled duo nebs, Pulmicort nebs.   - cont. To wean O2 as tolerated.   3.  CHF-acute on chronic diastolic dysfunction.  Resolved and off IV Lasix for now.   - CXR yesterday showing more bronchitic changes rather than CHF.  4.  History of chronic atrial fibrillation-rate controlled.  Continue amiodarone, digoxin. -Continue Eliquis.  5.  Anxiety/Depression-continue  Klonopin as needed. -Appreciate psychiatric consult and cont. Zoloft, Seroquel.  6. Neuropathy - cont. Neurontin.   7. UTI - Last day of Ceftriaxone and will stop abx today.  - urine cultures consistent with contamination.    Possible d/c back to LTC tomorrow.   All the records are reviewed and case discussed with Care Management/Social Worker. Management plans discussed with the patient, family and they are in agreement.  CODE STATUS: DNR  DVT Prophylaxis: Eliquis  TOTAL TIME TAKING CARE OF THIS PATIENT: 30 minutes.   POSSIBLE D/C IN 1-2 DAYS, DEPENDING ON CLINICAL CONDITION.   Henreitta Leber M.D on 07/05/2018 at 1:22 PM  Between 7am to 6pm - Pager - [phone]  After 6pm go to [email or web address] - Proofreader  [facility]  Office  [phone]  CC: Primary care physician; Birdie Sons, MD

## 2018-07-05 NOTE — Consult Note (Signed)
PHARMACY CONSULT NOTE - FOLLOW UP  Pharmacy Consult for Electrolyte Monitoring and Replacement   Recent Labs: Potassium (mmol/L)  Date Value  07/05/2018 4.7  09/25/2014 3.7   Magnesium (mg/dL)  Date Value  07/03/2018 2.1   Calcium (mg/dL)  Date Value  07/05/2018 8.5 (L)   Calcium, Total (mg/dL)  Date Value  09/25/2014 9.5   Albumin (g/dL)  Date Value  07/01/2018 2.3 (L)  08/20/2016 3.8  09/25/2014 4.2   Phosphorus (mg/dL)  Date Value  08/20/2016 2.8   Sodium (mmol/L)  Date Value  07/05/2018 126 (L)  08/20/2016 137  09/25/2014 137   Assessment: Pharmacy consulted for electrolyte monitoring and replacement in 78 yo female admitted with AECOPD, PNA and CHF exacerbation.   Goal of Therapy:  Electrolyte WNL  Plan:  All electrolytes wnl today: no supplementation needed at this time  Pharmacy will sign off this consultation for now  Dallie Piles, PharmD Clinical Pharmacist 07/05/2018 7:04 AM

## 2018-07-06 LAB — BASIC METABOLIC PANEL
Anion gap: 8 (ref 5–15)
BUN: 44 mg/dL — AB (ref 8–23)
CO2: 43 mmol/L — ABNORMAL HIGH (ref 22–32)
Calcium: 8.5 mg/dL — ABNORMAL LOW (ref 8.9–10.3)
Chloride: 76 mmol/L — ABNORMAL LOW (ref 98–111)
Creatinine, Ser: 0.87 mg/dL (ref 0.44–1.00)
GFR calc Af Amer: >60 mL/min (ref >60)
GFR calc non Af Amer: >60 mL/min (ref >60)
Glucose, Bld: 125 mg/dL — ABNORMAL HIGH (ref 70–99)
POTASSIUM: 5 mmol/L (ref 3.5–5.1)
Sodium: 127 mmol/L — ABNORMAL LOW (ref 135–145)

## 2018-07-06 LAB — CULTURE, BLOOD (ROUTINE X 2)
Culture: NO GROWTH
Culture: NO GROWTH

## 2018-07-06 LAB — DIGOXIN LEVEL: Digoxin Level: 1 ng/mL (ref 0.8–2.0)

## 2018-07-06 MED ORDER — METHYLPREDNISOLONE SODIUM SUCC 40 MG IJ SOLR
40.0000 mg | Freq: Every day | INTRAMUSCULAR | Status: DC
  Administered 2018-07-07: 40 mg via INTRAVENOUS
  Filled 2018-07-06: qty 1

## 2018-07-06 MED ORDER — FUROSEMIDE 40 MG PO TABS
40.0000 mg | ORAL_TABLET | Freq: Every day | ORAL | Status: DC
  Administered 2018-07-06 – 2018-07-07 (×2): 40 mg via ORAL
  Filled 2018-07-06 (×2): qty 1

## 2018-07-06 NOTE — Clinical Social Work Note (Signed)
Patient to discharge back to [facility] tomorrow according to the MD. Claiborne Billings at [facility] updated. Shela Leff MSW,LCSW [phone]

## 2018-07-06 NOTE — Progress Notes (Addendum)
Vicki Morales: Vicki Morales    MR#:  [id number]  DATE OF BIRTH:  May 17, 1941  SUBJECTIVE:   Wheezing improved.  Spoke with patient's daughter over the phone.  REVIEW OF SYSTEMS:    Review of Systems  Constitutional: Negative for chills and fever.  HENT: Negative for congestion and tinnitus.   Eyes: Negative for blurred vision and double vision.  Respiratory: Negative for cough, shortness of breath and wheezing.   Cardiovascular: Negative for chest pain, orthopnea and PND.  Gastrointestinal: Negative for abdominal pain, diarrhea, nausea and vomiting.  Genitourinary: Negative for dysuria and hematuria.  Neurological: Negative for dizziness, sensory change and focal weakness.  All other systems reviewed and are negative.   Nutrition: Dysphagia III Tolerating Diet: yes Tolerating PT: bedbound at baseline.    DRUG ALLERGIES:   Allergies  Allergen Reactions  . Arthrotec [Diclofenac-Misoprostol] Hives  . Codeine Other (See Comments)    Headache   . Diclofenac Hives and Other (See Comments)    Reaction: unknown  . Hydrochlorothiazide Other (See Comments)    Weakness   . Penicillins Hives    Has patient had a PCN reaction causing immediate rash, facial/tongue/throat swelling, SOB or lightheadedness with hypotension: No Has patient had a PCN reaction causing severe rash involving mucus membranes or skin necrosis: No Has patient had a PCN reaction that required hospitalization: No Has patient had a PCN reaction occurring within the last 10 years: No If all of the above answers are "NO", then may proceed with Cephalosporin use.  . Sulfa Antibiotics Hives  . Tiotropium Bromide Monohydrate Other (See Comments)    Blurry vision   . Tylenol [Acetaminophen] Other (See Comments)    Doesn't work  . Morphine Hives and Rash  . Pantoprazole Rash    VITALS:  Blood pressure 121/62, pulse (!) 54, temperature 97.9 F (36.6 C),  resp. rate 19, height 5\' 5"  (1.651 m), weight 108.1 kg, SpO2 94 %.  PHYSICAL EXAMINATION:   Physical Exam  GENERAL:  78 y.o.-year-old obese patient lying in bed in no acute distress.  EYES: Pupils equal, round, reactive to light and accommodation. No scleral icterus. Extraocular muscles intact.  HEENT: Head atraumatic, normocephalic. Oropharynx and nasopharynx clear.  NECK:  Supple, no jugular venous distention. No thyroid enlargement, no tenderness.  LUNGS: Good air entry bilaterally, end-exp. rhonchi, wheezing bilaterally, negative use accessory muscles. CARDIOVASCULAR: S1, S2 normal. No murmurs, rubs, or gallops.  ABDOMEN: Soft, nontender, nondistended. Bowel sounds present. No organomegaly or mass.  EXTREMITIES: No cyanosis, clubbing or edema b/l.    NEUROLOGIC: Cranial nerves II through XII are intact. No focal Motor or sensory deficits b/l. Globally weak.   PSYCHIATRIC: The patient is alert and oriented x 3.  SKIN: No obvious rash, lesion, or ulcer.    LABORATORY PANEL:   CBC Recent Labs  Lab 07/03/18 0514  WBC 2.9*  HGB 9.3*  HCT 29.4*  PLT 163   ------------------------------------------------------------------------------------------------------------------  Chemistries  Recent Labs  Lab 07/01/18 2050  07/03/18 0514  07/06/18 0500  NA 126*   < > 130*   < > 127*  K 3.4*   < > 4.1   < > 5.0  CL 72*   < > 78*   < > 76*  CO2 46*   < > 44*   < > 43*  GLUCOSE 84   < > 142*   < > 125*  BUN 16   < >  22   < > 44*  CREATININE 0.59   < > 0.77   < > 0.87  CALCIUM 8.7*   < > 8.7*   < > 8.5*  MG  --    < > 2.1  --   --   AST 26  --   --   --   --   ALT 12  --   --   --   --   ALKPHOS 48  --   --   --   --   BILITOT 0.8  --   --   --   --    < > = values in this interval not displayed.   ------------------------------------------------------------------------------------------------------------------  Cardiac Enzymes No results for input(s): TROPONINI in the last 168  hours. ------------------------------------------------------------------------------------------------------------------  RADIOLOGY:  No results found.   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  1.  Acute on chronic hypoxic/hypercapnic respiratory failure-secondary to COPD exacerbation and underlying mild CHF. -Patient has been weaned from high flow nasal cannula to just 5 L nasal cannula now.  Adjust sats to 90% or higher.  - 2.  COPD exacerbation- clinically improving, decrease the dose of steroids, likely discharge back to [facility] tomorrow.  3.  CHF-acute on chronic diastolic dysfunction.  Has hyponatremia, start oral Lasix.  History of hyponatremia previously improved with Lasix..   - CXR yesterday showing more bronchitic changes rather than CHF.  4.  History of chronic atrial fibrillation-rate controlled.  Continue amiodarone, digoxin. -Continue Eliquis.  5.  Anxiety/Depression-continue Klonopin as needed. -Appreciate psychiatric consult and cont. Zoloft, Seroquel.  6. Neuropathy - cont. Neurontin.   7. UTI - Last day of Ceftriaxone and will stop abx today.  - urine cultures consistent with contamination.    8 .urine retention, patient was seen by Dr. Bernardo Heater 2 weeks ago, recommended voiding trials in 7 to 10 days, not been done yet, recommend DC Foley today for voiding trials /if fails reinsert the Foley.  All the records are reviewed and case discussed with Care Management/Social Worker. Management plans discussed with the patient, family and they are in agreement.  CODE STATUS: DNR  DVT Prophylaxis: Eliquis   TOTAL TIME TAKING CARE OF THIS PATIENT: 38 minutes.  More than 50% time spent in counseling,  coordination of care Likely discharge tomorrow back to [facility].  Spoke to patient's daughter. Epifanio Lesches M.D on 07/06/2018 at 11:41 AM  Between 7am to 6pm - Pager - [phone]  After 6pm go to [email or web address] - Proofreader  [facility]  Office  [phone]  CC: Primary care physician; Birdie Sons, MD

## 2018-07-07 ENCOUNTER — Inpatient Hospital Stay: Payer: Medicare HMO

## 2018-07-07 LAB — BASIC METABOLIC PANEL
Anion gap: 4 — ABNORMAL LOW (ref 5–15)
BUN: 40 mg/dL — ABNORMAL HIGH (ref 8–23)
CO2: 47 mmol/L — ABNORMAL HIGH (ref 22–32)
Calcium: 8.2 mg/dL — ABNORMAL LOW (ref 8.9–10.3)
Chloride: 79 mmol/L — ABNORMAL LOW (ref 98–111)
Creatinine, Ser: 0.74 mg/dL (ref 0.44–1.00)
GFR calc Af Amer: >60 mL/min (ref >60)
GFR calc non Af Amer: >60 mL/min (ref >60)
Glucose, Bld: 116 mg/dL — ABNORMAL HIGH (ref 70–99)
POTASSIUM: 4.6 mmol/L (ref 3.5–5.1)
Sodium: 130 mmol/L — ABNORMAL LOW (ref 135–145)

## 2018-07-07 MED ORDER — PREDNISONE 10 MG (21) PO TBPK: ORAL_TABLET | ORAL | 0 refills | Status: DC

## 2018-07-07 MED ORDER — QUETIAPINE FUMARATE ER 50 MG PO TB24: 100.0000 mg | ORAL_TABLET | Freq: Every day | ORAL | 0 refills | Status: DC

## 2018-07-07 MED ORDER — FUROSEMIDE 40 MG PO TABS: 40.0000 mg | ORAL_TABLET | Freq: Every day | ORAL | 0 refills | Status: DC

## 2018-07-07 MED ORDER — IPRATROPIUM-ALBUTEROL 0.5-2.5 (3) MG/3ML IN SOLN
3.0000 mL | Freq: Three times a day (TID) | RESPIRATORY_TRACT | Status: DC
  Administered 2018-07-07 (×2): 3 mL via RESPIRATORY_TRACT
  Filled 2018-07-07 (×2): qty 3

## 2018-07-07 MED ORDER — SERTRALINE HCL 100 MG PO TABS: 100.0000 mg | ORAL_TABLET | Freq: Every day | ORAL | 0 refills | Status: DC

## 2018-07-07 NOTE — Clinical Social Work Note (Signed)
Patient to discharge today to return to [facility]. Discharge information sent. Claiborne Billings at [facility] aware of patient's return. CSW contacted patient's daughter to notify of discharge. Shela Leff MSW,LCSW [phone]

## 2018-07-07 NOTE — Progress Notes (Signed)
Discharge teaching given to patient, patient verbalized understanding and had no questions. Patient IV removed. Patient will be transported to [facility] via EMS. Report called to [facility] and given to [employer], LPN. All patient belongings gathered prior to leaving.

## 2018-07-07 NOTE — Discharge Summary (Signed)
Vicki Morales, is a 78 y.o. female  DOB 06-01-1941  MRN [id number].  Admission date:  07/01/2018  Admitting Physician  Lance Coon, MD  Discharge Date:  07/07/2018   Primary MD  Birdie Sons, MD  Recommendations for primary care physician for things to follow:   Follow-up with PCP in 1 week   Admission Diagnosis  HCAP (healthcare-associated pneumonia) [J18.9] Failure of outpatient treatment [Z78.9] Chronic respiratory failure with hypoxia and hypercapnia ([place]) [J96.11, J96.12]   Discharge Diagnosis  HCAP (healthcare-associated pneumonia) [J18.9] Failure of outpatient treatment [Z78.9] Chronic respiratory failure with hypoxia and hypercapnia (HCC) [J96.11, J96.12]    Principal Problem:   COPD exacerbation ([place]) Active Problems:   GERD (gastroesophageal reflux disease)   HLD (hyperlipidemia)   Anxiety   Aspiration pneumonia (HCC)   Acute on chronic respiratory failure with hypercapnia (HCC)   UTI (urinary tract infection)   Pressure injury of skin      Past Medical History:  Diagnosis Date  . Anxiety   . Arthritis   . Cataract    right eye  . COPD (chronic obstructive pulmonary disease) ([place])   . Depression   . Dyspnea    DOE  . Dysrhythmia   . Edema    FEET/ANKLES  . GERD (gastroesophageal reflux disease)   . H/O wheezing   . History of hiatal hernia   . HOH (hard of hearing)   . Hypertension   . Incontinence of urine in female   . Orthopnea   . Pain    CHRONIC KNEE  . Pain    CHRONIC KNEE  . Paranoid disorder ([place])   . RLS (restless legs syndrome)   . Tremors of nervous system    HANDS    Past Surgical History:  Procedure Laterality Date  . ABDOMINAL HYSTERECTOMY  1988  . APPENDECTOMY  1988  . CARDIAC CATHETERIZATION    . CATARACT EXTRACTION W/PHACO Right 07/14/2016   Procedure:  CATARACT EXTRACTION PHACO AND INTRAOCULAR LENS PLACEMENT ([place]) anterior vitrectomy;  Surgeon: Estill Cotta, MD;  Location: [facility] ORS;  Service: Ophthalmology;  Laterality: Right;  [place] 02:40 AP% 24.4 CDE 59.98 Fluid pack # [id number]  . CATARACT EXTRACTION W/PHACO Left 02/16/2017   Procedure: CATARACT EXTRACTION PHACO AND INTRAOCULAR LENS PLACEMENT (IOC);  Surgeon: Estill Cotta, MD;  Location: [facility] ORS;  Service: Ophthalmology;  Laterality: Left;  Lot # [id number] H [place]: 01:11.8 AP%: 23.5 CDE: 32.09   . ESOPHAGOGASTRODUODENOSCOPY N/A 11/11/2014   Procedure: ESOPHAGOGASTRODUODENOSCOPY (EGD);  Surgeon: Josefine Class, MD;  Location: [facility] ENDOSCOPY;  Service: Endoscopy;  Laterality: N/A;  . TONSILLECTOMY AND ADENOIDECTOMY    . 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 fib on Eliquis comes from [facility] because of shortness of breath.   Hospital Course  Acute on chronic hypoxic and hypercapnic respiratory failure secondary to COPD exacerbation, underlying mild congestive heart failure.  Patient improved with IV steroids, bronchodilators, Pulmicort nebs, stable for discharge back to [facility] with prednisone dose taper, continue bronchodilators, oxygen, she is on  5 L of oxygen and saturation 98%.  #2 /hypervolemic hyponatremia improved with diuretics, patient has history of chronic hyponatremia improved with diuretics so continue Lasix at discharge, sodium low as 126. 3.  Major depression, anxiety disorder, restless leg syndrome:  Psychiatrist this time recommended starting Zoloft 100 mg daily, added Seroquel XR 100 mg at bedtime to assist with sleep.  Psychiatrisrecommended to discontinue Risperdal, recommended to continue BuSpar, Klonopin. #4 urine retention: Patient had urine retention during previous admission and was seen by urologist, patient to Foley discontinued, did voiding trials, she voided well.  No Foley catheter at discharge. 5.  Acute on chronic diastolic heart failure improved with diuretics. 6.  COPD exacerbation: Improved with bronchodilators, steroids. #7/ chronic A. fib, rate controlled, continue amiodarone, digoxin, Eliquis.  #8 dysphagia: Pa therapy recommended dysphagia 3 diet with nectar thick liquids.    Patient is high risk for recurrent admissions CODE STATUS DNR Discussed with patient's daughter.   Discharge Condition:    Follow UP  Contact information for after-discharge care    [facility] .   Service:  Skilled Nursing Contact information: [address] [place] [phone]                Discharge Instructions  and  Discharge Medications      Allergies as of 07/07/2018      Reactions   Arthrotec [diclofenac-misoprostol] Hives   Codeine Other (See Comments)   Headache    Diclofenac Hives, Other (See Comments)   Reaction: unknown   Hydrochlorothiazide Other (See Comments)   Weakness    Penicillins Hives   Has patient had a PCN reaction causing immediate rash, facial/tongue/throat swelling, SOB or lightheadedness with hypotension: No Has patient had a PCN reaction causing severe rash involving mucus membranes or skin necrosis: No Has patient had a PCN reaction that required hospitalization: No Has patient had a PCN reaction occurring within the last 10 years: No If all of the above answers are "NO", then may proceed with Cephalosporin use.   Sulfa Antibiotics Hives   Tiotropium Bromide  Monohydrate Other (See Comments)   Blurry vision    Tylenol [acetaminophen] Other (See Comments)   Doesn't work   Morphine Hives, Rash   Pantoprazole Rash      Medication List    STOP taking these medications   levofloxacin 750 MG tablet Commonly known as:  LEVAQUIN     TAKE these medications   acetaminophen 325 MG tablet Commonly known as:  TYLENOL Take 650 mg by mouth every 4 (four) hours as needed for mild pain or moderate pain.   acidophilus Caps capsule Take 1 capsule by mouth 3 (three) times daily with meals.   albuterol (2.5 MG/3ML) 0.083% nebulizer solution Commonly known as:  PROVENTIL Take 3 mLs (2.5 mg total) by nebulization 2 (two) times daily as needed for wheezing or shortness of breath.   amiodarone 200 MG tablet Commonly known as:  PACERONE Take 1 tablet (200 mg total) by mouth 2 (two) times daily.   apixaban 5 MG Tabs tablet Commonly known as:  ELIQUIS Take 1 tablet (5 mg total) by mouth 2 (two) times daily.   ascorbic acid 250 MG tablet Commonly known as:  VITAMIN C Take 1 tablet (250 mg total) by mouth 2 (two) times daily.   aspirin 81 MG EC tablet Take 1 tablet (81 mg total) by mouth daily.   budesonide 0.5 MG/2ML nebulizer solution Commonly known  as:  PULMICORT Take 2 mLs (0.5 mg total) by nebulization 2 (two) times daily.   busPIRone 10 MG tablet Commonly known as:  BUSPAR Take 1.5 tablets (15 mg total) by mouth 3 (three) times daily for 15 days.   clonazePAM 0.25 MG disintegrating tablet Commonly known as:  KLONOPIN Take 1 tablet (0.25 mg total) by mouth 2 (two) times daily for 15 days.   Cranberry 250 MG Caps Take 1 capsule by mouth daily.   cyanocobalamin 1000 MCG tablet Take 1,000 mcg by mouth daily at 6 PM.   digoxin 0.125 MG tablet Commonly known as:  LANOXIN Take 0.5 tablets (0.0625 mg total) by mouth daily.   divalproex 250 MG 24 hr tablet Commonly known as:  DEPAKOTE ER Take 250 mg by mouth 2 (two) times daily.    feeding supplement (ENSURE ENLIVE) Liqd Take 237 mLs by mouth 2 (two) times daily between meals.   fluticasone 50 MCG/ACT nasal spray Commonly known as:  FLONASE Place 1 spray into both nostrils daily.   Fluticasone-Umeclidin-Vilant 100-62.5-25 MCG/INH Aepb Commonly known as:  TRELEGY ELLIPTA Inhale 1 puff into the lungs daily.   furosemide 40 MG tablet Commonly known as:  LASIX Take 1 tablet (40 mg total) by mouth daily.   gabapentin 100 MG capsule Commonly known as:  NEURONTIN Take 3 capsules (300 mg total) by mouth 2 (two) times daily. What changed:  Another medication with the same name was removed. Continue taking this medication, and follow the directions you see here.   guaiFENesin 600 MG 12 hr tablet Commonly known as:  MUCINEX Take 1 tablet (600 mg total) by mouth 2 (two) times daily.   loperamide 2 MG capsule Commonly known as:  IMODIUM Take 1 capsule (2 mg total) by mouth as needed for diarrhea or loose stools.   magnesium oxide 400 (241.3 Mg) MG tablet Commonly known as:  MAG-OX Take 1 tablet (400 mg total) by mouth 2 (two) times daily.   meclizine 12.5 MG tablet Commonly known as:  ANTIVERT Take 12.5 mg by mouth 2 (two) times daily as needed for dizziness.   Melatonin 10 MG Caps Take 10 mg by mouth at bedtime.   midodrine 10 MG tablet Commonly known as:  PROAMATINE Take 1 tablet (10 mg total) by mouth 3 (three) times daily with meals.   montelukast 10 MG tablet Commonly known as:  SINGULAIR TAKE 1 TABLET(10 MG) BY MOUTH EVERY NIGHT What changed:  See the new instructions.   omega-3 acid ethyl esters 1 g capsule Commonly known as:  LOVAZA Take 1 capsule (1 g total) by mouth daily.   predniSONE 10 MG (21) Tbpk tablet Commonly known as:  STERAPRED UNI-PAK 21 TAB Taper by 10 mg p.o. daily   Probiotic 250 MG Caps Take 1 capsule by mouth 3 times/day as needed-between meals & bedtime.   protein supplement shake Liqd Commonly known as:  PREMIER  PROTEIN Take 325 mLs (11 oz total) by mouth 2 (two) times daily between meals.   risperiDONE 1 MG tablet Commonly known as:  RISPERDAL Take 1 tablet (1 mg total) by mouth at bedtime.   sertraline 100 MG tablet Commonly known as:  ZOLOFT Take 1 tablet (100 mg total) by mouth daily.   theophylline 200 MG 24 hr capsule Commonly known as:  THEO-24 Take 1 capsule (200 mg total) by mouth daily.         Diet and Activity recommendation: See Discharge Instructions above   Consults obtained -speech therapy  Major procedures and Radiology Reports - PLEASE review detailed and final reports for all details, in brief -      Dg Chest Port 1 View  Result Date: 07/04/2018 CLINICAL DATA:  Wheezing. EXAM: PORTABLE CHEST 1 VIEW COMPARISON:  Multiple previous chest x-rays and a prior chest CT from 03/13/2018 FINDINGS: The cardiac silhouette, mediastinal and hilar contours are stable. Stable moderate tortuosity of the thoracic aorta. Persistent bronchitic type interstitial lung changes but overall improved lung aeration since prior study from 3 days ago. Small left effusion suspected with left basilar atelectasis. IMPRESSION: Persistent bronchitic lung changes but overall slight improved aeration. Suspect small left effusion along with overlying atelectasis. Electronically Signed   By: Marijo Sanes M.D.   On: 07/04/2018 10:02   Dg Chest Portable 1 View  Result Date: 07/01/2018 CLINICAL DATA:  Shortness of breath. EXAM: PORTABLE CHEST 1 VIEW COMPARISON:  Most recent radiograph 06/21/2018 FINDINGS: Similar cardiomegaly with unchanged mediastinal contours, tortuous atherosclerotic aorta. Increased vascular congestion with interstitial thickening suspicious for pulmonary edema. Patchy bibasilar opacities are nonspecific. Limited assessment for pleural effusion. Calcified mediastinal nodes. No pneumothorax. Chronic change about the shoulders. IMPRESSION: 1. Increased vascular congestion with  interstitial thickening, suspicious for pulmonary edema. 2. Patchy bibasilar opacities are nonspecific, may be atelectasis, pneumonia, or aspiration. Electronically Signed   By: Keith Rake M.D.   On: 07/01/2018 21:39   Dg Chest Port 1 View  Result Date: 06/21/2018 CLINICAL DATA:  Shortness of breath and productive cough. EXAM: PORTABLE CHEST 1 VIEW COMPARISON:  Single-view of the chest 06/15/2018, 06/09/2018 and 02/19/2018. FINDINGS: Cardiomegaly and vascular congestion appear unchanged. Calcified right paratracheal lymph node is also unchanged. Aortic atherosclerosis is seen. No pneumothorax or pleural effusion. IMPRESSION: No acute disease. No change in cardiomegaly and vascular congestion Electronically Signed   By: Inge Rise M.D.   On: 06/21/2018 11:52   Dg Chest Port 1 View  Result Date: 06/15/2018 CLINICAL DATA:  Shortness of breath, weakness EXAM: PORTABLE CHEST 1 VIEW COMPARISON:  06/09/2018 FINDINGS: Cardiomegaly with vascular congestion. No overt edema. No confluent opacities or effusions. No acute bony abnormality. IMPRESSION: Cardiomegaly, vascular congestion. Electronically Signed   By: Rolm Baptise M.D.   On: 06/15/2018 11:25   Dg Chest Port 1 View  Result Date: 06/09/2018 CLINICAL DATA:  Shortness of breath. EXAM: PORTABLE CHEST 1 VIEW COMPARISON:  03/13/2018. FINDINGS: Cardiomegaly with pulmonary venous congestion bilateral interstitial prominence. No pleural effusion. No pneumothorax. No acute bony abnormality. IMPRESSION: Cardiomegaly with pulmonary venous congestion and mild bilateral from interstitial prominence suggesting CHF. Mild pneumonitis can not be excluded. A component of underlying chronic interstitial lung disease may also be present. Electronically Signed   By: Marcello [place]ores  Register   On: 06/09/2018 10:36    Micro Results     Recent Results (from the past 240 hour(s))  Culture, blood (Routine x 2)     Status: None   Collection Time: 07/01/18  8:51 PM  Result  Value Ref Range Status   Specimen Description BLOOD RIGHT ANTECUBITAL  Final   Special Requests   Final    Blood Culture results may not be optimal due to an excessive volume of blood received in culture bottles   Culture   Final    NO GROWTH 5 DAYS Performed at [facility], [address] [place], [place] [address]    Report Status 07/06/2018 FINAL  Final  Culture, blood (Routine x 2)     Status: None   Collection Time: 07/01/18  8:51 PM  Result Value Ref Range Status   Specimen Description BLOOD LEFT ANTECUBITAL  Final   Special Requests   Final    Blood Culture results may not be optimal due to an excessive volume of blood received in culture bottles   Culture   Final    NO GROWTH 5 DAYS Performed at [facility], [address], [place], [place] [address]    Report Status 07/06/2018 FINAL  Final  Urine Culture     Status: Abnormal   Collection Time: 07/01/18  8:51 PM  Result Value Ref Range Status   Specimen Description   Final    URINE, RANDOM Performed at [facility], [address] [place], [place] [address]    Special Requests   Final    NONE Performed at [facility], [address] [place], [place] [address]    Culture MULTIPLE SPECIES PRESENT, SUGGEST RECOLLECTION (A)  Final   Report Status 07/03/2018 FINAL  Final  MRSA PCR Screening     Status: None   Collection Time: 07/02/18  5:46 AM  Result Value Ref Range Status   MRSA by PCR NEGATIVE NEGATIVE Final    Comment:        The GeneXpert MRSA Assay (FDA approved for NASAL specimens only), is one component of a comprehensive MRSA colonization surveillance program. It is not intended to diagnose MRSA infection nor to guide or monitor treatment for MRSA infections. Performed at [facility], [address] [place], [place] [address]        Today   Subjective:   Adelena Desantiago today is feeling better, less short of breath, stable for  discharge. Objective:   Blood pressure (!) 148/67, pulse 84, temperature 97.7 F (36.5 C), temperature source Oral, resp. rate 15, height 5\' 5"  (1.651 m), weight 111.6 kg, SpO2 97 %.   Intake/Output Summary (Last 24 hours) at 07/07/2018 0907 Last data filed at 07/07/2018 0800 Gross per 24 hour  Intake 840 ml  Output 1650 ml  Net -810 ml    Exam Awake Alert, Oriented x 3, No new F.N deficits, Normal affect [place].AT,PERRAL Supple Neck,No JVD, No cervical lymphadenopathy appriciated.  Symmetrical Chest wall movement, Good air movement bilaterally, CTAB RRR,No Gallops,Rubs or new Murmurs, No Parasternal Heave +ve B.Sounds, Abd Soft, Non tender, No organomegaly appriciated, No rebound -guarding or rigidity. No Cyanosis, Clubbing or edema, No new Rash or bruise  Data Review   CBC w Diff:  Lab Results  Component Value Date   WBC 2.9 (L) 07/03/2018   HGB 9.3 (L) 07/03/2018   HGB 15.4 05/22/2015   HCT 29.4 (L) 07/03/2018   HCT 46.5 05/22/2015   PLT 163 07/03/2018   PLT 192 05/22/2015   LYMPHOPCT 10 07/01/2018   LYMPHOPCT 19.5 11/03/2012   MONOPCT 13 07/01/2018   MONOPCT 7.8 11/03/2012   EOSPCT 1 07/01/2018   EOSPCT 0.0 11/03/2012   BASOPCT 0 07/01/2018   BASOPCT 0.1 11/03/2012    CMP:  Lab Results  Component Value Date   NA 130 (L) 07/07/2018   NA 137 08/20/2016   NA 137 09/25/2014   K 4.6 07/07/2018   K 3.7 09/25/2014   CL 79 (L) 07/07/2018   CL 103 09/25/2014   CO2 47 (H) 07/07/2018   CO2 28 09/25/2014   BUN 40 (H) 07/07/2018   BUN 13 08/20/2016   BUN 15 09/25/2014   CREATININE 0.74 07/07/2018   CREATININE 0.82 09/25/2014   GLU 85 01/21/2014  PROT 5.7 (L) 07/01/2018   PROT 6.0 05/22/2015   PROT 6.7 09/25/2014   ALBUMIN 2.3 (L) 07/01/2018   ALBUMIN 3.8 08/20/2016   ALBUMIN 4.2 09/25/2014   BILITOT 0.8 07/01/2018   BILITOT 0.4 05/22/2015   BILITOT 0.7 09/25/2014   ALKPHOS 48 07/01/2018   ALKPHOS 84 09/25/2014   AST 26 07/01/2018   AST 22 09/25/2014    ALT 12 07/01/2018   ALT 18 09/25/2014  .   Total Time in preparing paper work, data evaluation and todays exam - 35 minutes  Epifanio Lesches M.D on 07/07/2018 at 9:07 AM    Note: This dictation was prepared with Dragon dictation along with smaller phrase technology. Any transcriptional errors that result from this process are unintentional.

## 2018-07-07 NOTE — Care Management Important Message (Signed)
Copy of signed Medicare IM left with patient in room. 

## 2018-07-07 NOTE — Evaluation (Signed)
Objective Swallowing Evaluation: Type of Study: MBS-Modified Barium Swallow Study   Patient Details  Name: Vicki Morales MRN: [id number] Date of Birth: 20-Apr-1941  Today's Date: 07/07/2018 Time: SLP Start Time (ACUTE ONLY): 0931 -SLP Stop Time (ACUTE ONLY): 1031  SLP Time Calculation (min) (ACUTE ONLY): 60 min   Past Medical History:  Past Medical History:  Diagnosis Date  . Anxiety   . Arthritis   . Cataract    right eye  . COPD (chronic obstructive pulmonary disease) ([place])   . Depression   . Dyspnea    DOE  . Dysrhythmia   . Edema    FEET/ANKLES  . GERD (gastroesophageal reflux disease)   . H/O wheezing   . History of hiatal hernia   . HOH (hard of hearing)   . Hypertension   . Incontinence of urine in female   . Orthopnea   . Pain    CHRONIC KNEE  . Pain    CHRONIC KNEE  . Paranoid disorder ([place])   . RLS (restless legs syndrome)   . Tremors of nervous system    HANDS   Past Surgical History:  Past Surgical History:  Procedure Laterality Date  . ABDOMINAL HYSTERECTOMY  1988  . APPENDECTOMY  1988  . CARDIAC CATHETERIZATION    . CATARACT EXTRACTION W/PHACO Right 07/14/2016   Procedure: CATARACT EXTRACTION PHACO AND INTRAOCULAR LENS PLACEMENT ([place]) anterior vitrectomy;  Surgeon: Estill Cotta, MD;  Location: [facility] ORS;  Service: Ophthalmology;  Laterality: Right;  [place] 02:40 AP% 24.4 CDE 59.98 Fluid pack # [id number]  . CATARACT EXTRACTION W/PHACO Left 02/16/2017   Procedure: CATARACT EXTRACTION PHACO AND INTRAOCULAR LENS PLACEMENT (IOC);  Surgeon: Estill Cotta, MD;  Location: [facility] ORS;  Service: Ophthalmology;  Laterality: Left;  Lot # [id number] H [place]: 01:11.8 AP%: 23.5 CDE: 32.09   . ESOPHAGOGASTRODUODENOSCOPY N/A 11/11/2014   Procedure: ESOPHAGOGASTRODUODENOSCOPY (EGD);  Surgeon: Josefine Class, MD;  Location: [facility] ENDOSCOPY;  Service: Endoscopy;  Laterality: N/A;  . TONSILLECTOMY AND ADENOIDECTOMY    . TRIGGER FINGER RELEASE  2004   HPI: Per  admitting H&P, pt is a 78 y.o. female who presents with chief complaint of respiratory distress.  Patient presents to the ED with acute onset shortness of breath.  She has chronic respiratory failure on oxygen normally, and has been treated for the last several days for pneumonia at the facility where she resides.  However, today she became acutely more short of breath and was somewhat hypoxic as well.  She was brought to the ED for evaluation.  Here she is found to have a UTI as well.  She is having significant amount of wheezing.  She is also had increased edema recently and her x-ray shows some edema in addition to her pneumonia.  Hospitalist were called for admission and further treatment   Subjective: Patient in [place] chair, alert and responsive    Assessment / Plan / Recommendation    This 78 year old woman; with COPD and significant signs of aspiration during a bedside clinical swallowing assessment; is presenting with mild oropharyngeal dysphagia characterized by delayed pharyngeal swallow initiation, reduced pharyngeal pressure generation, trace-to-mild pharyngeal residue, and TRANSIENT laryngeal penetration.  Oral control of the bolus including oral hold, rotary mastication, and anterior to posterior transfer is within functional limits for mechanical soft diet (lacking dentition, slowed oral management, but no other concerns).   There is no observed tracheal aspiration.  The patient was assessed with thin liquid via straw.  The patient is not at significant  risk for prandial aspiration.  Bolus stasis within the esophagus is observed indicating potential esophageal dysmotility.  This study supports a mechanical soft diet with thin liquids (may use straws).   CHL IP CLINICAL IMPRESSIONS 07/07/2018  Clinical Impression --  SLP Visit Diagnosis Dysphagia, oropharyngeal phase (R13.12)  Attention and concentration deficit following --  Frontal lobe and executive function deficit following --  Impact  on safety and function No limitations      CHL IP TREATMENT RECOMMENDATION 07/03/2018  Treatment Recommendations Therapy as outlined in treatment plan below     Prognosis 07/03/2018  Prognosis for Safe Diet Advancement Fair  Barriers to Reach Goals Severity of deficits  Barriers/Prognosis Comment --    CHL IP DIET RECOMMENDATION 07/07/2018  SLP Diet Recommendations Dysphagia 3 (Mech soft) solids;Thin liquid  Liquid Administration via Straw;Cup  Medication Administration Whole meds with liquid  Compensations --  Postural Changes Remain semi-upright after after feeds/meals (Comment)      CHL IP OTHER RECOMMENDATIONS 07/07/2018  Recommended Consults Consider GI evaluation;Other (Comment)  Oral Care Recommendations --  Other Recommendations --      CHL IP FOLLOW UP RECOMMENDATIONS 07/07/2018  Follow up Recommendations Skilled Nursing facility      [facility] IP FREQUENCY AND DURATION 07/03/2018  Speech Therapy Frequency (ACUTE ONLY) min 2x/week  Treatment Duration 2 weeks           CHL IP ORAL PHASE 07/07/2018  Oral Phase Impaired  Oral - Pudding Teaspoon --  Oral - Pudding Cup --  Oral - Honey Teaspoon --  Oral - Honey Cup --  Oral - Nectar Teaspoon --  Oral - Nectar Cup --  Oral - Nectar Straw --  Oral - Thin Teaspoon --  Oral - Thin Cup --  Oral - Thin Straw --  Oral - Puree --  Oral - Mech Soft Delayed oral transit  Oral - Regular --  Oral - Multi-Consistency --  Oral - Pill --  Oral Phase - Comment --    CHL IP PHARYNGEAL PHASE 07/07/2018  Pharyngeal Phase Impaired  Pharyngeal- Pudding Teaspoon Delayed swallow initiation-vallecula;Delayed swallow initiation-pyriform sinuses;Reduced anterior laryngeal mobility;Reduced laryngeal elevation;Reduced tongue base retraction  Pharyngeal --  Pharyngeal- Pudding Cup --  Pharyngeal --  Pharyngeal- Honey Teaspoon --  Pharyngeal --  Pharyngeal- Honey Cup --  Pharyngeal --  Pharyngeal- Nectar Teaspoon --  Pharyngeal --   Pharyngeal- Nectar Cup Delayed swallow initiation-vallecula;Delayed swallow initiation-pyriform sinuses;Reduced anterior laryngeal mobility;Reduced laryngeal elevation;Reduced tongue base retraction  Pharyngeal --  Pharyngeal- Nectar Straw --  Pharyngeal --  Pharyngeal- Thin Teaspoon --  Pharyngeal --  Pharyngeal- Thin Cup Delayed swallow initiation-vallecula;Delayed swallow initiation-pyriform sinuses;Reduced anterior laryngeal mobility;Reduced laryngeal elevation;Reduced tongue base retraction;Penetration/Aspiration before swallow  Pharyngeal --  Pharyngeal- Thin Straw Delayed swallow initiation-vallecula;Delayed swallow initiation-pyriform sinuses;Reduced anterior laryngeal mobility;Reduced laryngeal elevation;Reduced tongue base retraction;Penetration/Aspiration before swallow  Pharyngeal --  Pharyngeal- Puree --  Pharyngeal --  Pharyngeal- Mechanical Soft Delayed swallow initiation-vallecula;Delayed swallow initiation-pyriform sinuses;Reduced anterior laryngeal mobility;Reduced laryngeal elevation;Reduced tongue base retraction  Pharyngeal --  Pharyngeal- Regular --  Pharyngeal --  Pharyngeal- Multi-consistency --  Pharyngeal --  Pharyngeal- Pill --  Pharyngeal --  Pharyngeal Comment --     CHL IP CERVICAL ESOPHAGEAL PHASE 07/07/2018  Cervical Esophageal Phase Impaired  Pudding Teaspoon --  Pudding Cup --  Honey Teaspoon --  Honey Cup --  Nectar Teaspoon --  Nectar Cup --  Nectar Straw --  Thin Teaspoon --  Thin Cup --  Thin Straw --  Puree --  Mechanical Soft --  Regular --  Multi-consistency --  Pill --  Cervical Esophageal Comment --    Leroy Sea, MS/CCC- SLP  Lou Miner 07/07/2018, 10:32 AM

## 2018-07-10 DIAGNOSIS — I509 Heart failure, unspecified: Secondary | ICD-10-CM | POA: Diagnosis not present

## 2018-07-10 DIAGNOSIS — I4891 Unspecified atrial fibrillation: Secondary | ICD-10-CM | POA: Diagnosis not present

## 2018-07-10 DIAGNOSIS — I2729 Other secondary pulmonary hypertension: Secondary | ICD-10-CM | POA: Diagnosis not present

## 2018-07-10 DIAGNOSIS — J449 Chronic obstructive pulmonary disease, unspecified: Secondary | ICD-10-CM | POA: Diagnosis not present

## 2018-07-11 DIAGNOSIS — J9621 Acute and chronic respiratory failure with hypoxia: Secondary | ICD-10-CM | POA: Diagnosis not present

## 2018-07-11 DIAGNOSIS — J690 Pneumonitis due to inhalation of food and vomit: Secondary | ICD-10-CM | POA: Diagnosis not present

## 2018-07-11 DIAGNOSIS — F411 Generalized anxiety disorder: Secondary | ICD-10-CM | POA: Diagnosis not present

## 2018-07-11 DIAGNOSIS — F331 Major depressive disorder, recurrent, moderate: Secondary | ICD-10-CM | POA: Diagnosis not present

## 2018-07-11 DIAGNOSIS — R1312 Dysphagia, oropharyngeal phase: Secondary | ICD-10-CM | POA: Diagnosis not present

## 2018-07-12 DIAGNOSIS — J690 Pneumonitis due to inhalation of food and vomit: Secondary | ICD-10-CM | POA: Diagnosis not present

## 2018-07-12 DIAGNOSIS — R1312 Dysphagia, oropharyngeal phase: Secondary | ICD-10-CM | POA: Diagnosis not present

## 2018-07-12 DIAGNOSIS — J9621 Acute and chronic respiratory failure with hypoxia: Secondary | ICD-10-CM | POA: Diagnosis not present

## 2018-07-13 DIAGNOSIS — R1312 Dysphagia, oropharyngeal phase: Secondary | ICD-10-CM | POA: Diagnosis not present

## 2018-07-13 DIAGNOSIS — J189 Pneumonia, unspecified organism: Secondary | ICD-10-CM | POA: Diagnosis not present

## 2018-07-13 DIAGNOSIS — J9621 Acute and chronic respiratory failure with hypoxia: Secondary | ICD-10-CM | POA: Diagnosis not present

## 2018-07-13 DIAGNOSIS — J690 Pneumonitis due to inhalation of food and vomit: Secondary | ICD-10-CM | POA: Diagnosis not present

## 2018-07-14 DIAGNOSIS — J690 Pneumonitis due to inhalation of food and vomit: Secondary | ICD-10-CM | POA: Diagnosis not present

## 2018-07-14 DIAGNOSIS — R1312 Dysphagia, oropharyngeal phase: Secondary | ICD-10-CM | POA: Diagnosis not present

## 2018-07-14 DIAGNOSIS — J9621 Acute and chronic respiratory failure with hypoxia: Secondary | ICD-10-CM | POA: Diagnosis not present

## 2018-07-17 DIAGNOSIS — E875 Hyperkalemia: Secondary | ICD-10-CM | POA: Diagnosis not present

## 2018-07-17 DIAGNOSIS — R1312 Dysphagia, oropharyngeal phase: Secondary | ICD-10-CM | POA: Diagnosis not present

## 2018-07-17 DIAGNOSIS — J9621 Acute and chronic respiratory failure with hypoxia: Secondary | ICD-10-CM | POA: Diagnosis not present

## 2018-07-17 DIAGNOSIS — F200 Paranoid schizophrenia: Secondary | ICD-10-CM | POA: Diagnosis not present

## 2018-07-17 DIAGNOSIS — E1140 Type 2 diabetes mellitus with diabetic neuropathy, unspecified: Secondary | ICD-10-CM | POA: Diagnosis not present

## 2018-07-17 DIAGNOSIS — D649 Anemia, unspecified: Secondary | ICD-10-CM | POA: Diagnosis not present

## 2018-07-17 DIAGNOSIS — J690 Pneumonitis due to inhalation of food and vomit: Secondary | ICD-10-CM | POA: Diagnosis not present

## 2018-07-17 DIAGNOSIS — H6123 Impacted cerumen, bilateral: Secondary | ICD-10-CM | POA: Diagnosis not present

## 2018-07-18 DIAGNOSIS — J9621 Acute and chronic respiratory failure with hypoxia: Secondary | ICD-10-CM | POA: Diagnosis not present

## 2018-07-18 DIAGNOSIS — J449 Chronic obstructive pulmonary disease, unspecified: Secondary | ICD-10-CM | POA: Diagnosis not present

## 2018-07-18 DIAGNOSIS — I509 Heart failure, unspecified: Secondary | ICD-10-CM | POA: Diagnosis not present

## 2018-07-18 DIAGNOSIS — R1312 Dysphagia, oropharyngeal phase: Secondary | ICD-10-CM | POA: Diagnosis not present

## 2018-07-18 DIAGNOSIS — F331 Major depressive disorder, recurrent, moderate: Secondary | ICD-10-CM | POA: Diagnosis not present

## 2018-07-18 DIAGNOSIS — J690 Pneumonitis due to inhalation of food and vomit: Secondary | ICD-10-CM | POA: Diagnosis not present

## 2018-07-18 DIAGNOSIS — J9610 Chronic respiratory failure, unspecified whether with hypoxia or hypercapnia: Secondary | ICD-10-CM | POA: Diagnosis not present

## 2018-07-18 DIAGNOSIS — I2729 Other secondary pulmonary hypertension: Secondary | ICD-10-CM | POA: Diagnosis not present

## 2018-07-18 DIAGNOSIS — F411 Generalized anxiety disorder: Secondary | ICD-10-CM | POA: Diagnosis not present

## 2018-07-19 ENCOUNTER — Non-Acute Institutional Stay: Payer: Medicare HMO | Admitting: Nurse Practitioner

## 2018-07-19 ENCOUNTER — Encounter: Payer: Self-pay | Admitting: Nurse Practitioner

## 2018-07-19 VITALS — BP 122/85 | HR 78 | Temp 98.7°F | Resp 22 | Wt 241.5 lb

## 2018-07-19 DIAGNOSIS — J9621 Acute and chronic respiratory failure with hypoxia: Secondary | ICD-10-CM | POA: Diagnosis not present

## 2018-07-19 DIAGNOSIS — R1312 Dysphagia, oropharyngeal phase: Secondary | ICD-10-CM | POA: Diagnosis not present

## 2018-07-19 DIAGNOSIS — R630 Anorexia: Secondary | ICD-10-CM

## 2018-07-19 DIAGNOSIS — Z515 Encounter for palliative care: Secondary | ICD-10-CM | POA: Diagnosis not present

## 2018-07-19 DIAGNOSIS — J690 Pneumonitis due to inhalation of food and vomit: Secondary | ICD-10-CM | POA: Diagnosis not present

## 2018-07-19 DIAGNOSIS — R0602 Shortness of breath: Secondary | ICD-10-CM

## 2018-07-19 NOTE — Progress Notes (Signed)
[facility] Consult Note Telephone: [phone]  Fax: [phone]  PATIENT NAME: Vicki Morales DOB: [birth year] MRN: [id number]  PRIMARY CARE PROVIDER:   Birdie Sons, MD  REFERRING PROVIDER:  Dr Hodges/[facility] RESPONSIBLE PARTY:   Circe Chilton daughter [phone]  RECOMMENDATIONS and PLAN:  1. Palliative care encounter Z51.5; Palliative medicine team will continue to support patient, patient's family, and medical team. Visit consisted of counseling and education dealing with the complex and emotionally intense issues of symptom management and palliative care in the setting of serious and potentially life-threatening illness  2. Dyspneic R06.00 secondary to PNA, COPD/Pulmonary HTN; CHF , continue O2; inhalation therapy; Monitor respiratory status; weights   3. Anorexia R63.0 declined appetite secondary to PNA; COPD/pulmonary HTN/CHF. Continue to encourage supplements and comfort feedings. Discuss nutrition.  ASSESSMENT:     PC consult requested following hospitalization for PNA/COPD; Pulmonary HTN/CHF for discussion of goc.  I visited and observed Vicki Morales. We talked about purpose for palliative care visit. We talked about symptoms of pain would she denies. We talked about shortness of breath what she does have intermittently. We talked about O2 dependents. We talked about pneumonia what she is currently being treated for. We talked about short-term rehab which she has not been able to participate since she has been sick. We talked about how she is feeling overall. We talked about residing the [place]. She was cooperative with assessment. She became fatigued during palliative care visit and discuss that can revisit at a time when she's feeling better. Vicki Morales was in agreement. DNR does remain in place. I have attempted to contact her daughter for update on palliative care visit. No new changes at  present time to current goals are plan of care.  1 / 2 / 2,020 weight 242.2 lbs 1 / 31 / 2020 weight 241.5 lb BMI 41.4   I spent 60 minutes providing this consultation,  from 9:30am to 10:30am. More than 50% of the time in this consultation was spent coordinating communication.   HISTORY OF PRESENT ILLNESS:  Vicki Morales is a 78 y.o. year old female with multiple medical problems including COPD, pulmonary hypertension, chronic airflow limitation, nocturnal hypoxia, hypertension, restless leg syndrome, diverticulosis of colon, guard, history of hiatal hernia, osteoporosis, history of tension headaches, hyperlipidemia, obesity, chronic low back pain, arthritis, depression, anxiety with paranoid disorder, schizophrenia, history of suicide attempt, hard of hearing, trigger finger release, tonsillectomy, adenoidectomy, cataract extraction, appendectomy, abdominal hysterectomy. Hospitalized 06/09/2018 to 1/17 /2020 for acute respiratory failure with hypoxia and hypercapnia, acute on chronic congestive heart failure. She did require mission to step down, BiPAP with iv Lasix. She returned the [facility] to be readmitted 1 / 25 / 2020 to 1 / 31 / 2020 for chronic respiratory failure with hypercapnia and hypoxia yeah, healthcare-associated pneumonia with mild congestive heart failure. She did improve with IV steroids, bronchodilators, pulmicort nebulizer, remained on oxygen at 5L/[place]. Hypervolemic with hyponatremia improve with diuresing. Major depression Same by Psychiatry and continue to be followed [place] health care when returned. Urinary retention with urologist consulted and Foley discontinued. Acute on chronic diastolic congestive heart failure requiring diuretics. Chronic atrial fibrillation continued on amnio [place], Eliquis, digoxin. Speech therapy recommended dysphagia 3 diet with nectar thickened liquids for dysphagia. She remained a DNR and discharged back to [facility] where  she continues to reside in [place] for short-term rehab. She is a  one person assist with ADLs, transfers. She does feed herself and appetite has been poor since she has not been feeling well. She is currently being treated for pneumonia, confirmed by chest x-ray on 2/6 /2020 requiring antibiotic therapy. No recent falls, wounds. At present she is lying in bed. She appears chronically ill but comfortable, no visitors present. Palliative Care was asked to help address goals of care.   CODE STATUS: DNR  PPS: 40% HOSPICE ELIGIBILITY/DIAGNOSIS: TBD  PAST MEDICAL HISTORY:  Past Medical History:  Diagnosis Date  . Anxiety   . Arthritis   . Cataract    right eye  . COPD (chronic obstructive pulmonary disease) ([place])   . Depression   . Dyspnea    DOE  . Dysrhythmia   . Edema    FEET/ANKLES  . GERD (gastroesophageal reflux disease)   . H/O wheezing   . History of hiatal hernia   . HOH (hard of hearing)   . Hypertension   . Incontinence of urine in female   . Orthopnea   . Pain    CHRONIC KNEE  . Pain    CHRONIC KNEE  . Paranoid disorder ([place])   . RLS (restless legs syndrome)   . Tremors of nervous system    HANDS    SOCIAL HX:  Social History   Tobacco Use  . Smoking status: Former Smoker    Years: 30.00  . Smokeless tobacco: Never Used  . Tobacco comment: quit in 1989  Substance Use Topics  . Alcohol use: No    Alcohol/week: 0.0 standard drinks    ALLERGIES:  Allergies  Allergen Reactions  . Arthrotec [Diclofenac-Misoprostol] Hives  . Codeine Other (See Comments)    Headache   . Diclofenac Hives and Other (See Comments)    Reaction: unknown  . Hydrochlorothiazide Other (See Comments)    Weakness   . Penicillins Hives    Has patient had a PCN reaction causing immediate rash, facial/tongue/throat swelling, SOB or lightheadedness with hypotension: No Has patient had a PCN reaction causing severe rash involving mucus membranes or skin necrosis: No Has  patient had a PCN reaction that required hospitalization: No Has patient had a PCN reaction occurring within the last 10 years: No If all of the above answers are "NO", then may proceed with Cephalosporin use.  . Sulfa Antibiotics Hives  . Tiotropium Bromide Monohydrate Other (See Comments)    Blurry vision   . Tylenol [Acetaminophen] Other (See Comments)    Doesn't work  . Morphine Hives and Rash  . Pantoprazole Rash     PERTINENT MEDICATIONS:  Outpatient Encounter Medications as of 07/19/2018  Medication Sig  . acetaminophen (TYLENOL) 325 MG tablet Take 650 mg by mouth every 4 (four) hours as needed for mild pain or moderate pain.  [name] Kitchen acidophilus (RISAQUAD) CAPS capsule Take 1 capsule by mouth 3 (three) times daily with meals.  [name] Kitchen albuterol (PROVENTIL) (2.5 MG/3ML) 0.083% nebulizer solution Take 3 mLs (2.5 mg total) by nebulization 2 (two) times daily as needed for wheezing or shortness of breath.  [name] Kitchen amiodarone (PACERONE) 200 MG tablet Take 1 tablet (200 mg total) by mouth 2 (two) times daily.  [name] Kitchen apixaban (ELIQUIS) 5 MG TABS tablet Take 1 tablet (5 mg total) by mouth 2 (two) times daily.  [name] Kitchen aspirin EC 81 MG EC tablet Take 1 tablet (81 mg total) by mouth daily.  . budesonide (PULMICORT) 0.5 MG/2ML nebulizer solution Take 2 mLs (0.5 mg total) by nebulization 2 (two) times  daily. (Patient not taking: Reported on 07/01/2018)  . clonazePAM (KLONOPIN) 0.25 MG disintegrating tablet Take 1 tablet (0.25 mg total) by mouth 2 (two) times daily for 15 days.  . Cranberry 250 MG CAPS Take 1 capsule by mouth daily.   . cyanocobalamin 1000 MCG tablet Take 1,000 mcg by mouth daily at 6 PM.   . digoxin (LANOXIN) 0.125 MG tablet Take 0.5 tablets (0.0625 mg total) by mouth daily.  . divalproex (DEPAKOTE ER) 250 MG 24 hr tablet Take 250 mg by mouth 2 (two) times daily.  . feeding supplement, ENSURE ENLIVE, (ENSURE ENLIVE) LIQD Take 237 mLs by mouth 2 (two) times daily between meals. (Patient not taking: Reported  on 07/01/2018)  . fluticasone (FLONASE) 50 MCG/ACT nasal spray Place 1 spray into both nostrils daily.  . Fluticasone-Umeclidin-Vilant (TRELEGY ELLIPTA) 100-62.5-25 MCG/INH AEPB Inhale 1 puff into the lungs daily.  . furosemide (LASIX) 40 MG tablet Take 1 tablet (40 mg total) by mouth daily.  [name] Kitchen gabapentin (NEURONTIN) 100 MG capsule Take 3 capsules (300 mg total) by mouth 2 (two) times daily. (Patient not taking: Reported on 07/01/2018)  . guaiFENesin (MUCINEX) 600 MG 12 hr tablet Take 1 tablet (600 mg total) by mouth 2 (two) times daily.  [name] Kitchen loperamide (IMODIUM) 2 MG capsule Take 1 capsule (2 mg total) by mouth as needed for diarrhea or loose stools.  . magnesium oxide (MAG-OX) 400 (241.3 Mg) MG tablet Take 1 tablet (400 mg total) by mouth 2 (two) times daily.  . meclizine (ANTIVERT) 12.5 MG tablet Take 12.5 mg by mouth 2 (two) times daily as needed for dizziness.  . Melatonin 10 MG CAPS Take 10 mg by mouth at bedtime.   . midodrine (PROAMATINE) 10 MG tablet Take 1 tablet (10 mg total) by mouth 3 (three) times daily with meals.  . montelukast (SINGULAIR) 10 MG tablet TAKE 1 TABLET(10 MG) BY MOUTH EVERY NIGHT (Patient taking differently: Take 10 mg by mouth daily. )  . omega-3 acid ethyl esters (LOVAZA) 1 g capsule Take 1 capsule (1 g total) by mouth daily.  . predniSONE (STERAPRED UNI-PAK 21 TAB) 10 MG (21) TBPK tablet Taper by 10 mg p.o. daily  . protein supplement shake (PREMIER PROTEIN) LIQD Take 325 mLs (11 oz total) by mouth 2 (two) times daily between meals. (Patient not taking: Reported on 07/01/2018)  . QUEtiapine (SEROQUEL XR) 50 MG TB24 24 hr tablet Take 2 tablets (100 mg total) by mouth at bedtime.  . Saccharomyces boulardii (PROBIOTIC) 250 MG CAPS Take 1 capsule by mouth 3 times/day as needed-between meals & bedtime. (Patient not taking: Reported on 07/01/2018)  . sertraline (ZOLOFT) 100 MG tablet Take 1 tablet (100 mg total) by mouth daily.  . theophylline (THEO-24) 200 MG 24 hr capsule  Take 1 capsule (200 mg total) by mouth daily.  . vitamin C (VITAMIN C) 250 MG tablet Take 1 tablet (250 mg total) by mouth 2 (two) times daily. (Patient not taking: Reported on 07/01/2018)   No facility-administered encounter medications on file as of 07/19/2018.     PHYSICAL EXAM:   General: obese, chronically ill female, pleasant Cardiovascular: regular rate and rhythm Pulmonary: rhonchi with wheezing Abdomen: soft, nontender, + bowel sounds GU: no suprapubic tenderness Extremities: mild BLE edema, no joint deformities Skin: no rashes Neurological: Weakness but otherwise nonfocal  Loris Winrow Ihor Gully, NP

## 2018-07-24 DIAGNOSIS — J449 Chronic obstructive pulmonary disease, unspecified: Secondary | ICD-10-CM | POA: Diagnosis not present

## 2018-07-24 DIAGNOSIS — D649 Anemia, unspecified: Secondary | ICD-10-CM | POA: Diagnosis not present

## 2018-07-24 DIAGNOSIS — I2729 Other secondary pulmonary hypertension: Secondary | ICD-10-CM | POA: Diagnosis not present

## 2018-07-24 DIAGNOSIS — I5033 Acute on chronic diastolic (congestive) heart failure: Secondary | ICD-10-CM | POA: Diagnosis not present

## 2018-07-25 DIAGNOSIS — F411 Generalized anxiety disorder: Secondary | ICD-10-CM | POA: Diagnosis not present

## 2018-07-25 DIAGNOSIS — F331 Major depressive disorder, recurrent, moderate: Secondary | ICD-10-CM | POA: Diagnosis not present

## 2018-07-27 DIAGNOSIS — F200 Paranoid schizophrenia: Secondary | ICD-10-CM | POA: Diagnosis not present

## 2018-07-27 DIAGNOSIS — G4701 Insomnia due to medical condition: Secondary | ICD-10-CM | POA: Diagnosis not present

## 2018-07-27 DIAGNOSIS — F331 Major depressive disorder, recurrent, moderate: Secondary | ICD-10-CM | POA: Diagnosis not present

## 2018-07-27 DIAGNOSIS — F419 Anxiety disorder, unspecified: Secondary | ICD-10-CM | POA: Diagnosis not present

## 2018-07-29 DIAGNOSIS — R062 Wheezing: Secondary | ICD-10-CM | POA: Diagnosis not present

## 2018-07-29 DIAGNOSIS — J449 Chronic obstructive pulmonary disease, unspecified: Secondary | ICD-10-CM | POA: Diagnosis not present

## 2018-07-31 DIAGNOSIS — H6693 Otitis media, unspecified, bilateral: Secondary | ICD-10-CM | POA: Diagnosis not present

## 2018-08-01 ENCOUNTER — Encounter: Payer: Self-pay | Admitting: *Deleted

## 2018-08-01 ENCOUNTER — Emergency Department: Payer: Medicare HMO

## 2018-08-01 ENCOUNTER — Inpatient Hospital Stay
Admission: EM | Admit: 2018-08-01 | Discharge: 2018-08-04 | DRG: 291 | Disposition: A | Discharge status: Hospice | Payer: Medicare HMO | Source: Skilled Nursing Facility | Attending: Internal Medicine | Admitting: Internal Medicine

## 2018-08-01 ENCOUNTER — Other Ambulatory Visit: Payer: Self-pay

## 2018-08-01 DIAGNOSIS — I480 Paroxysmal atrial fibrillation: Secondary | ICD-10-CM | POA: Diagnosis present

## 2018-08-01 DIAGNOSIS — Z7189 Other specified counseling: Secondary | ICD-10-CM

## 2018-08-01 DIAGNOSIS — G2581 Restless legs syndrome: Secondary | ICD-10-CM | POA: Diagnosis not present

## 2018-08-01 DIAGNOSIS — D649 Anemia, unspecified: Secondary | ICD-10-CM | POA: Diagnosis present

## 2018-08-01 DIAGNOSIS — I509 Heart failure, unspecified: Secondary | ICD-10-CM | POA: Diagnosis not present

## 2018-08-01 DIAGNOSIS — I5033 Acute on chronic diastolic (congestive) heart failure: Secondary | ICD-10-CM | POA: Diagnosis present

## 2018-08-01 DIAGNOSIS — R918 Other nonspecific abnormal finding of lung field: Secondary | ICD-10-CM | POA: Diagnosis not present

## 2018-08-01 DIAGNOSIS — J440 Chronic obstructive pulmonary disease with acute lower respiratory infection: Secondary | ICD-10-CM | POA: Diagnosis not present

## 2018-08-01 DIAGNOSIS — J9601 Acute respiratory failure with hypoxia: Secondary | ICD-10-CM | POA: Diagnosis not present

## 2018-08-01 DIAGNOSIS — F329 Major depressive disorder, single episode, unspecified: Secondary | ICD-10-CM | POA: Diagnosis present

## 2018-08-01 DIAGNOSIS — R069 Unspecified abnormalities of breathing: Secondary | ICD-10-CM | POA: Diagnosis not present

## 2018-08-01 DIAGNOSIS — E669 Obesity, unspecified: Secondary | ICD-10-CM | POA: Diagnosis present

## 2018-08-01 DIAGNOSIS — Z87891 Personal history of nicotine dependence: Secondary | ICD-10-CM

## 2018-08-01 DIAGNOSIS — G9349 Other encephalopathy: Secondary | ICD-10-CM | POA: Diagnosis present

## 2018-08-01 DIAGNOSIS — J441 Chronic obstructive pulmonary disease with (acute) exacerbation: Secondary | ICD-10-CM | POA: Diagnosis present

## 2018-08-01 DIAGNOSIS — I110 Hypertensive heart disease with heart failure: Principal | ICD-10-CM | POA: Diagnosis present

## 2018-08-01 DIAGNOSIS — Z7982 Long term (current) use of aspirin: Secondary | ICD-10-CM | POA: Diagnosis not present

## 2018-08-01 DIAGNOSIS — Z7951 Long term (current) use of inhaled steroids: Secondary | ICD-10-CM

## 2018-08-01 DIAGNOSIS — F419 Anxiety disorder, unspecified: Secondary | ICD-10-CM | POA: Diagnosis present

## 2018-08-01 DIAGNOSIS — J90 Pleural effusion, not elsewhere classified: Secondary | ICD-10-CM | POA: Diagnosis not present

## 2018-08-01 DIAGNOSIS — Z90710 Acquired absence of both cervix and uterus: Secondary | ICD-10-CM

## 2018-08-01 DIAGNOSIS — M2550 Pain in unspecified joint: Secondary | ICD-10-CM | POA: Diagnosis not present

## 2018-08-01 DIAGNOSIS — J9621 Acute and chronic respiratory failure with hypoxia: Secondary | ICD-10-CM | POA: Diagnosis present

## 2018-08-01 DIAGNOSIS — Z66 Do not resuscitate: Secondary | ICD-10-CM | POA: Diagnosis present

## 2018-08-01 DIAGNOSIS — H9190 Unspecified hearing loss, unspecified ear: Secondary | ICD-10-CM | POA: Diagnosis present

## 2018-08-01 DIAGNOSIS — Z515 Encounter for palliative care: Secondary | ICD-10-CM | POA: Diagnosis not present

## 2018-08-01 DIAGNOSIS — R0902 Hypoxemia: Secondary | ICD-10-CM

## 2018-08-01 DIAGNOSIS — R0689 Other abnormalities of breathing: Secondary | ICD-10-CM | POA: Diagnosis not present

## 2018-08-01 DIAGNOSIS — Z7952 Long term (current) use of systemic steroids: Secondary | ICD-10-CM

## 2018-08-01 DIAGNOSIS — J9622 Acute and chronic respiratory failure with hypercapnia: Secondary | ICD-10-CM | POA: Diagnosis not present

## 2018-08-01 DIAGNOSIS — R0603 Acute respiratory distress: Secondary | ICD-10-CM

## 2018-08-01 DIAGNOSIS — Z7401 Bed confinement status: Secondary | ICD-10-CM | POA: Diagnosis not present

## 2018-08-01 DIAGNOSIS — J09X2 Influenza due to identified novel influenza A virus with other respiratory manifestations: Secondary | ICD-10-CM | POA: Diagnosis not present

## 2018-08-01 DIAGNOSIS — Z79899 Other long term (current) drug therapy: Secondary | ICD-10-CM | POA: Diagnosis not present

## 2018-08-01 DIAGNOSIS — I4891 Unspecified atrial fibrillation: Secondary | ICD-10-CM | POA: Diagnosis not present

## 2018-08-01 DIAGNOSIS — I10 Essential (primary) hypertension: Secondary | ICD-10-CM | POA: Diagnosis not present

## 2018-08-01 DIAGNOSIS — K219 Gastro-esophageal reflux disease without esophagitis: Secondary | ICD-10-CM | POA: Diagnosis present

## 2018-08-01 DIAGNOSIS — I959 Hypotension, unspecified: Secondary | ICD-10-CM | POA: Diagnosis not present

## 2018-08-01 DIAGNOSIS — Z7901 Long term (current) use of anticoagulants: Secondary | ICD-10-CM | POA: Diagnosis not present

## 2018-08-01 DIAGNOSIS — J1000 Influenza due to other identified influenza virus with unspecified type of pneumonia: Secondary | ICD-10-CM | POA: Diagnosis not present

## 2018-08-01 DIAGNOSIS — Z9049 Acquired absence of other specified parts of digestive tract: Secondary | ICD-10-CM

## 2018-08-01 DIAGNOSIS — L03114 Cellulitis of left upper limb: Secondary | ICD-10-CM

## 2018-08-01 LAB — URINALYSIS, COMPLETE (UACMP) WITH MICROSCOPIC
Bacteria, UA: NONE SEEN
Bilirubin Urine: NEGATIVE
Glucose, UA: NEGATIVE mg/dL
Hgb urine dipstick: NEGATIVE
Ketones, ur: NEGATIVE mg/dL
Leukocytes,Ua: NEGATIVE
Nitrite: NEGATIVE
Protein, ur: NEGATIVE mg/dL
Specific Gravity, Urine: 1.012 (ref 1.005–1.030)
pH: 5 (ref 5.0–8.0)

## 2018-08-01 LAB — GLUCOSE, CAPILLARY: Glucose-Capillary: 128 mg/dL — ABNORMAL HIGH (ref 70–99)

## 2018-08-01 LAB — CBC WITH DIFFERENTIAL/PLATELET
Abs Immature Granulocytes: 0.55 10*3/uL — ABNORMAL HIGH (ref 0.00–0.07)
Basophils Absolute: 0 10*3/uL (ref 0.0–0.1)
Basophils Relative: 0 %
Eosinophils Absolute: 0 10*3/uL (ref 0.0–0.5)
Eosinophils Relative: 0 %
HCT: 21.9 % — ABNORMAL LOW (ref 36.0–46.0)
Hemoglobin: 6.3 g/dL — ABNORMAL LOW (ref 12.0–15.0)
Immature Granulocytes: 8 %
LYMPHS ABS: 0.3 10*3/uL — AB (ref 0.7–4.0)
Lymphocytes Relative: 4 %
MCH: 27.3 pg (ref 26.0–34.0)
MCHC: 28.8 g/dL — ABNORMAL LOW (ref 30.0–36.0)
MCV: 94.8 fL (ref 80.0–100.0)
Monocytes Absolute: 0.6 10*3/uL (ref 0.1–1.0)
Monocytes Relative: 9 %
Neutro Abs: 5.4 10*3/uL (ref 1.7–7.7)
Neutrophils Relative %: 79 %
Platelets: 258 10*3/uL (ref 150–400)
RBC: 2.31 MIL/uL — ABNORMAL LOW (ref 3.87–5.11)
RDW: 16.8 % — ABNORMAL HIGH (ref 11.5–15.5)
WBC: 6.9 10*3/uL (ref 4.0–10.5)
nRBC: 0 % (ref 0.0–0.2)

## 2018-08-01 LAB — COMPREHENSIVE METABOLIC PANEL
ALT: 14 U/L (ref 0–44)
AST: 25 U/L (ref 15–41)
Albumin: 2.1 g/dL — ABNORMAL LOW (ref 3.5–5.0)
Alkaline Phosphatase: 60 U/L (ref 38–126)
Anion gap: 9 (ref 5–15)
BUN: 16 mg/dL (ref 8–23)
CO2: 42 mmol/L — ABNORMAL HIGH (ref 22–32)
Calcium: 8.4 mg/dL — ABNORMAL LOW (ref 8.9–10.3)
Chloride: 82 mmol/L — ABNORMAL LOW (ref 98–111)
Creatinine, Ser: 0.51 mg/dL (ref 0.44–1.00)
GFR calc Af Amer: >60 mL/min (ref >60)
GFR calc non Af Amer: >60 mL/min (ref >60)
Glucose, Bld: 102 mg/dL — ABNORMAL HIGH (ref 70–99)
POTASSIUM: 3.7 mmol/L (ref 3.5–5.1)
Sodium: 133 mmol/L — ABNORMAL LOW (ref 135–145)
Total Bilirubin: 0.6 mg/dL (ref 0.3–1.2)
Total Protein: 5.7 g/dL — ABNORMAL LOW (ref 6.5–8.1)

## 2018-08-01 LAB — TROPONIN I: Troponin I: <0.03 ng/mL (ref <0.03)

## 2018-08-01 LAB — INFLUENZA PANEL BY PCR (TYPE A & B)
Influenza A By PCR: POSITIVE — AB
Influenza B By PCR: NEGATIVE

## 2018-08-01 LAB — HEPARIN LEVEL (UNFRACTIONATED): Heparin Unfractionated: >3.6 IU/mL — ABNORMAL HIGH (ref 0.30–0.70)

## 2018-08-01 LAB — TSH: TSH: 4.14 u[IU]/mL (ref 0.350–4.500)

## 2018-08-01 LAB — APTT: aPTT: 40 seconds — ABNORMAL HIGH (ref 24–36)

## 2018-08-01 LAB — ABO/RH: ABO/RH(D): A NEG

## 2018-08-01 LAB — LACTIC ACID, PLASMA: Lactic Acid, Venous: 0.8 mmol/L (ref 0.5–1.9)

## 2018-08-01 LAB — BRAIN NATRIURETIC PEPTIDE: B Natriuretic Peptide: 225 pg/mL — ABNORMAL HIGH (ref 0.0–100.0)

## 2018-08-01 LAB — HEMOGLOBIN: Hemoglobin: 8.3 g/dL — ABNORMAL LOW (ref 12.0–15.0)

## 2018-08-01 MED ORDER — CARBAMIDE PEROXIDE 6.5 % OT SOLN
5.0000 [drp] | Freq: Every day | OTIC | Status: DC
  Administered 2018-08-01 – 2018-08-04 (×3): 5 [drp] via OTIC
  Filled 2018-08-01 (×2): qty 15

## 2018-08-01 MED ORDER — SODIUM CHLORIDE 0.9 % IV SOLN
1.0000 g | Freq: Once | INTRAVENOUS | Status: AC
  Administered 2018-08-01: 1 g via INTRAVENOUS
  Filled 2018-08-01: qty 1

## 2018-08-01 MED ORDER — IPRATROPIUM-ALBUTEROL 0.5-2.5 (3) MG/3ML IN SOLN
3.0000 mL | Freq: Once | RESPIRATORY_TRACT | Status: AC
  Administered 2018-08-01: 3 mL via RESPIRATORY_TRACT
  Filled 2018-08-01: qty 3

## 2018-08-01 MED ORDER — APIXABAN 5 MG PO TABS
5.0000 mg | ORAL_TABLET | Freq: Two times a day (BID) | ORAL | Status: DC
  Administered 2018-08-01: 5 mg via ORAL
  Filled 2018-08-01: qty 1

## 2018-08-01 MED ORDER — FLUTICASONE FUROATE-VILANTEROL 100-25 MCG/INH IN AEPB
1.0000 | INHALATION_SPRAY | Freq: Every morning | RESPIRATORY_TRACT | Status: DC
  Filled 2018-08-01: qty 28

## 2018-08-01 MED ORDER — METHYLPREDNISOLONE SODIUM SUCC 40 MG IJ SOLR
40.0000 mg | Freq: Two times a day (BID) | INTRAMUSCULAR | Status: DC
  Administered 2018-08-01: 40 mg via INTRAVENOUS
  Filled 2018-08-01: qty 1

## 2018-08-01 MED ORDER — BUDESONIDE 0.5 MG/2ML IN SUSP
0.5000 mg | Freq: Two times a day (BID) | RESPIRATORY_TRACT | Status: DC
  Administered 2018-08-01: 0.5 mg via RESPIRATORY_TRACT
  Filled 2018-08-01: qty 2

## 2018-08-01 MED ORDER — DIGOXIN 125 MCG PO TABS
0.0625 mg | ORAL_TABLET | Freq: Every day | ORAL | Status: DC
  Administered 2018-08-02 – 2018-08-04 (×3): 0.0625 mg via ORAL
  Filled 2018-08-01 (×4): qty 0.5

## 2018-08-01 MED ORDER — MELATONIN 5 MG PO TABS
10.0000 mg | ORAL_TABLET | Freq: Every day | ORAL | Status: DC
  Administered 2018-08-01 – 2018-08-03 (×2): 10 mg via ORAL
  Filled 2018-08-01 (×4): qty 2

## 2018-08-01 MED ORDER — FUROSEMIDE 10 MG/ML IJ SOLN
20.0000 mg | Freq: Two times a day (BID) | INTRAMUSCULAR | Status: DC
  Administered 2018-08-01 – 2018-08-04 (×7): 20 mg via INTRAVENOUS
  Filled 2018-08-01 (×7): qty 2

## 2018-08-01 MED ORDER — FLUTICASONE PROPIONATE 50 MCG/ACT NA SUSP
1.0000 | Freq: Every day | NASAL | Status: DC
  Administered 2018-08-02 – 2018-08-04 (×3): 1 via NASAL
  Filled 2018-08-01: qty 16

## 2018-08-01 MED ORDER — GABAPENTIN 300 MG PO CAPS
300.0000 mg | ORAL_CAPSULE | Freq: Two times a day (BID) | ORAL | Status: DC
  Administered 2018-08-01 – 2018-08-04 (×8): 300 mg via ORAL
  Filled 2018-08-01 (×8): qty 1

## 2018-08-01 MED ORDER — AZITHROMYCIN 500 MG PO TABS
500.0000 mg | ORAL_TABLET | Freq: Every day | ORAL | Status: DC
  Administered 2018-08-01 – 2018-08-04 (×4): 500 mg via ORAL
  Filled 2018-08-01 (×4): qty 1

## 2018-08-01 MED ORDER — HEPARIN (PORCINE) 25000 UT/250ML-% IV SOLN
1150.0000 [IU]/h | INTRAVENOUS | Status: DC
  Administered 2018-08-01: 1150 [IU]/h via INTRAVENOUS
  Filled 2018-08-01: qty 250

## 2018-08-01 MED ORDER — CHLORHEXIDINE GLUCONATE 0.12 % MT SOLN
15.0000 mL | Freq: Two times a day (BID) | OROMUCOSAL | Status: DC
  Administered 2018-08-01 – 2018-08-04 (×6): 15 mL via OROMUCOSAL
  Filled 2018-08-01 (×5): qty 15

## 2018-08-01 MED ORDER — METHYLPREDNISOLONE SODIUM SUCC 40 MG IJ SOLR
40.0000 mg | Freq: Four times a day (QID) | INTRAMUSCULAR | Status: DC
  Administered 2018-08-01 – 2018-08-03 (×7): 40 mg via INTRAVENOUS
  Filled 2018-08-01 (×7): qty 1

## 2018-08-01 MED ORDER — CRANBERRY 250 MG PO CAPS: 1.0000 | ORAL_CAPSULE | Freq: Every day | ORAL | Status: DC

## 2018-08-01 MED ORDER — FUROSEMIDE 10 MG/ML IJ SOLN
40.0000 mg | Freq: Once | INTRAMUSCULAR | Status: AC
  Administered 2018-08-01: 40 mg via INTRAVENOUS
  Filled 2018-08-01: qty 4

## 2018-08-01 MED ORDER — ALBUTEROL SULFATE (2.5 MG/3ML) 0.083% IN NEBU
2.5000 mg | INHALATION_SOLUTION | RESPIRATORY_TRACT | Status: DC
  Administered 2018-08-01 – 2018-08-04 (×20): 2.5 mg via RESPIRATORY_TRACT
  Filled 2018-08-01 (×20): qty 3

## 2018-08-01 MED ORDER — ASPIRIN EC 81 MG PO TBEC
81.0000 mg | DELAYED_RELEASE_TABLET | Freq: Every day | ORAL | Status: DC
  Administered 2018-08-02 – 2018-08-04 (×3): 81 mg via ORAL
  Filled 2018-08-01 (×3): qty 1

## 2018-08-01 MED ORDER — VITAMIN B-12 1000 MCG PO TABS
1000.0000 ug | ORAL_TABLET | Freq: Every day | ORAL | Status: DC
  Administered 2018-08-02 – 2018-08-03 (×2): 1000 ug via ORAL
  Filled 2018-08-01 (×3): qty 1

## 2018-08-01 MED ORDER — VANCOMYCIN HCL IN DEXTROSE 1-5 GM/200ML-% IV SOLN
1000.0000 mg | Freq: Once | INTRAVENOUS | Status: AC
  Administered 2018-08-01: 1000 mg via INTRAVENOUS
  Filled 2018-08-01: qty 200

## 2018-08-01 MED ORDER — MIDODRINE HCL 5 MG PO TABS
10.0000 mg | ORAL_TABLET | Freq: Three times a day (TID) | ORAL | Status: DC
  Administered 2018-08-02 – 2018-08-04 (×7): 10 mg via ORAL
  Filled 2018-08-01 (×11): qty 2

## 2018-08-01 MED ORDER — UMECLIDINIUM BROMIDE 62.5 MCG/INH IN AEPB
1.0000 | INHALATION_SPRAY | Freq: Every morning | RESPIRATORY_TRACT | Status: DC
  Filled 2018-08-01: qty 7

## 2018-08-01 MED ORDER — VITAMIN C 500 MG PO TABS
250.0000 mg | ORAL_TABLET | Freq: Two times a day (BID) | ORAL | Status: DC
  Administered 2018-08-01 – 2018-08-04 (×6): 250 mg via ORAL
  Filled 2018-08-01 (×8): qty 0.5

## 2018-08-01 MED ORDER — FLUTICASONE-UMECLIDIN-VILANT 100-62.5-25 MCG/INH IN AEPB: 1.0000 | INHALATION_SPRAY | Freq: Every day | RESPIRATORY_TRACT | Status: DC

## 2018-08-01 MED ORDER — MAGNESIUM OXIDE 400 (241.3 MG) MG PO TABS
400.0000 mg | ORAL_TABLET | Freq: Two times a day (BID) | ORAL | Status: DC
  Administered 2018-08-01 – 2018-08-04 (×6): 400 mg via ORAL
  Filled 2018-08-01 (×9): qty 1

## 2018-08-01 MED ORDER — ORAL CARE MOUTH RINSE
15.0000 mL | Freq: Two times a day (BID) | OROMUCOSAL | Status: DC
  Administered 2018-08-01 – 2018-08-04 (×4): 15 mL via OROMUCOSAL

## 2018-08-01 MED ORDER — ONDANSETRON HCL 4 MG PO TABS: 4.0000 mg | ORAL_TABLET | Freq: Four times a day (QID) | ORAL | Status: DC | PRN

## 2018-08-01 MED ORDER — ONDANSETRON HCL 4 MG/2ML IJ SOLN: 4.0000 mg | Freq: Four times a day (QID) | INTRAMUSCULAR | Status: DC | PRN

## 2018-08-01 MED ORDER — RISAQUAD PO CAPS
1.0000 | ORAL_CAPSULE | Freq: Three times a day (TID) | ORAL | Status: DC
  Administered 2018-08-02 – 2018-08-04 (×6): 1 via ORAL
  Filled 2018-08-01 (×11): qty 1

## 2018-08-01 MED ORDER — SERTRALINE HCL 50 MG PO TABS
100.0000 mg | ORAL_TABLET | Freq: Every day | ORAL | Status: DC
  Administered 2018-08-01 – 2018-08-04 (×4): 100 mg via ORAL
  Filled 2018-08-01 (×4): qty 2

## 2018-08-01 MED ORDER — FUROSEMIDE 40 MG PO TABS
40.0000 mg | ORAL_TABLET | Freq: Every day | ORAL | Status: DC
  Administered 2018-08-01: 40 mg via ORAL
  Filled 2018-08-01: qty 1

## 2018-08-01 MED ORDER — ZANAMIVIR 5 MG/BLISTER IN AEPB
2.0000 | INHALATION_SPRAY | Freq: Two times a day (BID) | RESPIRATORY_TRACT | Status: DC
  Administered 2018-08-02: 2 via RESPIRATORY_TRACT
  Filled 2018-08-01: qty 20

## 2018-08-01 MED ORDER — MECLIZINE HCL 12.5 MG PO TABS
12.5000 mg | ORAL_TABLET | Freq: Two times a day (BID) | ORAL | Status: DC | PRN
  Filled 2018-08-01: qty 1

## 2018-08-01 MED ORDER — MONTELUKAST SODIUM 10 MG PO TABS
10.0000 mg | ORAL_TABLET | Freq: Every day | ORAL | Status: DC
  Administered 2018-08-01 – 2018-08-03 (×3): 10 mg via ORAL
  Filled 2018-08-01 (×4): qty 1

## 2018-08-01 MED ORDER — BUSPIRONE HCL 15 MG PO TABS
15.0000 mg | ORAL_TABLET | Freq: Three times a day (TID) | ORAL | Status: DC
  Administered 2018-08-01 – 2018-08-04 (×10): 15 mg via ORAL
  Filled 2018-08-01 (×13): qty 1
  Filled 2018-08-01: qty 3

## 2018-08-01 MED ORDER — DIVALPROEX SODIUM ER 250 MG PO TB24
250.0000 mg | ORAL_TABLET | Freq: Two times a day (BID) | ORAL | Status: DC
  Administered 2018-08-01 – 2018-08-04 (×8): 250 mg via ORAL
  Filled 2018-08-01 (×9): qty 1

## 2018-08-01 MED ORDER — ENSURE ENLIVE PO LIQD
237.0000 mL | Freq: Two times a day (BID) | ORAL | Status: DC
  Administered 2018-08-02 – 2018-08-04 (×3): 237 mL via ORAL

## 2018-08-01 MED ORDER — THEOPHYLLINE ER 100 MG PO CP24
200.0000 mg | ORAL_CAPSULE | Freq: Every day | ORAL | Status: DC
  Administered 2018-08-02 – 2018-08-04 (×3): 200 mg via ORAL
  Filled 2018-08-01 (×5): qty 2
  Filled 2018-08-01: qty 1

## 2018-08-01 MED ORDER — OMEGA-3-ACID ETHYL ESTERS 1 G PO CAPS
1.0000 g | ORAL_CAPSULE | Freq: Every day | ORAL | Status: DC
  Filled 2018-08-01: qty 1

## 2018-08-01 MED ORDER — OSELTAMIVIR PHOSPHATE 75 MG PO CAPS
75.0000 mg | ORAL_CAPSULE | Freq: Two times a day (BID) | ORAL | Status: DC
  Administered 2018-08-01 – 2018-08-04 (×6): 75 mg via ORAL
  Filled 2018-08-01 (×6): qty 1

## 2018-08-01 MED ORDER — DOCUSATE SODIUM 100 MG PO CAPS
100.0000 mg | ORAL_CAPSULE | Freq: Two times a day (BID) | ORAL | Status: DC
  Administered 2018-08-01 – 2018-08-04 (×6): 100 mg via ORAL
  Filled 2018-08-01 (×7): qty 1

## 2018-08-01 MED ORDER — BUDESONIDE 0.5 MG/2ML IN SUSP
0.5000 mg | Freq: Two times a day (BID) | RESPIRATORY_TRACT | Status: DC
  Administered 2018-08-02 – 2018-08-04 (×5): 0.5 mg via RESPIRATORY_TRACT
  Filled 2018-08-01 (×5): qty 2

## 2018-08-01 MED ORDER — PREMIER PROTEIN SHAKE
11.0000 [oz_av] | Freq: Two times a day (BID) | ORAL | Status: DC
  Administered 2018-08-02 – 2018-08-03 (×2): 11 [oz_av] via ORAL

## 2018-08-01 MED ORDER — AMIODARONE HCL 200 MG PO TABS
200.0000 mg | ORAL_TABLET | Freq: Two times a day (BID) | ORAL | Status: DC
  Administered 2018-08-01 – 2018-08-04 (×6): 200 mg via ORAL
  Filled 2018-08-01 (×8): qty 1

## 2018-08-01 MED ORDER — RISPERIDONE 1 MG PO TABS
1.0000 mg | ORAL_TABLET | Freq: Every day | ORAL | Status: DC
  Administered 2018-08-01 – 2018-08-03 (×3): 1 mg via ORAL
  Filled 2018-08-01 (×4): qty 1

## 2018-08-01 NOTE — Progress Notes (Signed)
Patient on bi-pap, unable to tolerate po medications at this time.

## 2018-08-01 NOTE — Progress Notes (Signed)
Per Dr Mortimer Fries repeat hemoglobin, previous 6.3.

## 2018-08-01 NOTE — H&P (Signed)
Vicki Morales is an 78 y.o. female.   Chief Complaint: Respiratory distress HPI: The patient with past medical history of COPD, CHF, hypertension, resting tremor/RLS and paranoia presents to the emergency department from her health care facility due to respiratory distress.  Reportedly her work of breathing was increased and oxygen saturations were in the 80s.  The patient cannot contribute much to her history due to tremulousness of her voice, hard of hearing and possible disjointed thoughts.  Apparently she is usually on supplemental O2 of 2 L/min as she was upon arrival.  X-ray showed bilateral perihilar edema.  She was given Lasix 40 mg IV in the emergency department but still required ventilatory support.  BiPAP was initiated with 50% FiO2 prior to the emergency department staff calling the hospitalist service for admission.  Past Medical History:  Diagnosis Date  . Anxiety   . Arthritis   . Cataract    right eye  . COPD (chronic obstructive pulmonary disease) ([place])   . Depression   . Dyspnea    DOE  . Dysrhythmia   . Edema    FEET/ANKLES  . GERD (gastroesophageal reflux disease)   . H/O wheezing   . History of hiatal hernia   . HOH (hard of hearing)   . Hypertension   . Incontinence of urine in female   . Orthopnea   . Pain    CHRONIC KNEE  . Pain    CHRONIC KNEE  . Paranoid disorder ([place])   . RLS (restless legs syndrome)   . Tremors of nervous system    HANDS    Past Surgical History:  Procedure Laterality Date  . ABDOMINAL HYSTERECTOMY  1988  . APPENDECTOMY  1988  . CARDIAC CATHETERIZATION    . CATARACT EXTRACTION W/PHACO Right 07/14/2016   Procedure: CATARACT EXTRACTION PHACO AND INTRAOCULAR LENS PLACEMENT ([place]) anterior vitrectomy;  Surgeon: Estill Cotta, MD;  Location: [facility] ORS;  Service: Ophthalmology;  Laterality: Right;  [place] 02:40 AP% 24.4 CDE 59.98 Fluid pack # [id number]  . CATARACT EXTRACTION W/PHACO Left 02/16/2017   Procedure: CATARACT EXTRACTION PHACO  AND INTRAOCULAR LENS PLACEMENT (IOC);  Surgeon: Estill Cotta, MD;  Location: [facility] ORS;  Service: Ophthalmology;  Laterality: Left;  Lot # [id number] H [place]: 01:11.8 AP%: 23.5 CDE: 32.09   . ESOPHAGOGASTRODUODENOSCOPY N/A 11/11/2014   Procedure: ESOPHAGOGASTRODUODENOSCOPY (EGD);  Surgeon: Josefine Class, MD;  Location: [facility] ENDOSCOPY;  Service: Endoscopy;  Laterality: N/A;  . TONSILLECTOMY AND ADENOIDECTOMY    . TRIGGER FINGER RELEASE  2004    Family History  Problem Relation Age of Onset  . Breast cancer Sister   . Stroke Father   . Emphysema Father   . Anxiety disorder Father   . Depression Father   . Anxiety disorder Brother    Social History:  reports that she has quit smoking. She quit after 30.00 years of use. She has never used smokeless tobacco. She reports that she does not drink alcohol or use drugs.  Allergies:  Allergies  Allergen Reactions  . Arthrotec [Diclofenac-Misoprostol] Hives  . Codeine Other (See Comments)    Headache   . Diclofenac Hives and Other (See Comments)    Reaction: unknown  . Hydrochlorothiazide Other (See Comments)    Weakness   . Penicillins Hives    Has patient had a PCN reaction causing immediate rash, facial/tongue/throat swelling, SOB or lightheadedness with hypotension: No Has patient had a PCN reaction causing severe rash involving mucus membranes or skin necrosis: No Has patient had  a PCN reaction that required hospitalization: No Has patient had a PCN reaction occurring within the last 10 years: No If all of the above answers are "NO", then may proceed with Cephalosporin use.  . Sulfa Antibiotics Hives  . Tiotropium Bromide Monohydrate Other (See Comments)    Blurry vision   . Tylenol [Acetaminophen] Other (See Comments)    Doesn't work  . Morphine Hives and Rash  . Pantoprazole Rash    Prior to Admission medications   Medication Sig Start Date End Date Taking? Authorizing Provider  acetaminophen (TYLENOL) 325 MG tablet Take  650 mg by mouth every 4 (four) hours as needed for mild pain or moderate pain.   Yes [provider]  acidophilus (RISAQUAD) CAPS capsule Take 1 capsule by mouth 3 (three) times daily with meals. 02/24/18  Yes Salary, Montell D, MD  albuterol (PROVENTIL) (2.5 MG/3ML) 0.083% nebulizer solution Take 3 mLs (2.5 mg total) by nebulization 2 (two) times daily as needed for wheezing or shortness of breath. Patient taking differently: Take 2.5 mg by nebulization every 6 (six) hours as needed for shortness of breath.  09/22/16  Yes Birdie Sons, MD  amiodarone (PACERONE) 200 MG tablet Take 1 tablet (200 mg total) by mouth 2 (two) times daily. 02/24/18  Yes Salary, Avel Peace, MD  apixaban (ELIQUIS) 5 MG TABS tablet Take 1 tablet (5 mg total) by mouth 2 (two) times daily. 02/24/18  Yes Salary, Avel Peace, MD  aspirin EC 81 MG EC tablet Take 1 tablet (81 mg total) by mouth daily. 02/25/18  Yes Salary, Avel Peace, MD  azithromycin (ZITHROMAX) 500 MG tablet Take 500 mg by mouth daily. Give 1 tablet by mouth in the evening for ear infection for 10 days. 07/31/18 08/10/18 Yes [provider]  busPIRone (BUSPAR) 10 MG tablet Take 15 mg by mouth 3 (three) times daily. Related to generalized anxiety disorder for 15 days, dose is 15mg  07/17/18 08/01/18 Yes [provider]  carbamide peroxide (DEBROX) 6.5 % OTIC solution Place 5 drops into both ears daily. For clogged ears for 7 days 07/28/18 08/04/18 Yes [provider]  Cranberry 250 MG CAPS Take 1 capsule by mouth daily.    Yes [provider]  cyanocobalamin 1000 MCG tablet Take 1,000 mcg by mouth daily at 6 PM.    Yes [provider]  digoxin (LANOXIN) 0.125 MG tablet Take 0.5 tablets (0.0625 mg total) by mouth daily. 02/24/18  Yes Salary, Avel Peace, MD  divalproex (DEPAKOTE ER) 250 MG 24 hr tablet Take 250 mg by mouth 2 (two) times daily.   Yes [provider]  fluticasone (FLONASE) 50 MCG/ACT nasal spray Place 1  spray into both nostrils daily.   Yes [provider]  Fluticasone-Umeclidin-Vilant (TRELEGY ELLIPTA) 100-62.5-25 MCG/INH AEPB Inhale 1 puff into the lungs daily. 11/08/17  Yes Birdie Sons, MD  furosemide (LASIX) 40 MG tablet Take 1 tablet (40 mg total) by mouth daily. 07/07/18  Yes Epifanio Lesches, MD  furosemide (LASIX) 40 MG tablet Take 40 mg by mouth daily. Give 1 tablet by mouth in the evening related to unspecified systolic (congestive) heart failure for 3 days. 07/30/18 08/02/18 Yes [provider]  gabapentin (NEURONTIN) 100 MG capsule Take 3 capsules (300 mg total) by mouth 2 (two) times daily. 06/23/18  Yes Pyreddy, Reatha Harps, MD  loperamide (IMODIUM) 2 MG capsule Take 1 capsule (2 mg total) by mouth as needed for diarrhea or loose stools. Patient taking differently: Take 2 mg  by mouth daily as needed for diarrhea or loose stools.  02/24/18  Yes Salary, Avel Peace, MD  magnesium oxide (MAG-OX) 400 (241.3 Mg) MG tablet Take 1 tablet (400 mg total) by mouth 2 (two) times daily. 03/20/18  Yes Epifanio Lesches, MD  meclizine (ANTIVERT) 12.5 MG tablet Take 12.5 mg by mouth 2 (two) times daily as needed for dizziness.   Yes [provider]  Melatonin 10 MG CAPS Take 10 mg by mouth at bedtime.    Yes [provider]  midodrine (PROAMATINE) 10 MG tablet Take 1 tablet (10 mg total) by mouth 3 (three) times daily with meals. 03/20/18  Yes Epifanio Lesches, MD  montelukast (SINGULAIR) 10 MG tablet TAKE 1 TABLET(10 MG) BY MOUTH EVERY NIGHT Patient taking differently: Take 10 mg by mouth at bedtime.  01/29/18  Yes Birdie Sons, MD  omega-3 acid ethyl esters (LOVAZA) 1 g capsule Take 1 capsule (1 g total) by mouth daily. 03/20/18  Yes Epifanio Lesches, MD  risperiDONE (RISPERDAL) 1 MG tablet Take 1 mg by mouth at bedtime. Related to schizophrenia 07/07/18  Yes [provider]  sertraline (ZOLOFT) 100 MG tablet Take 1 tablet (100 mg total) by mouth  daily. 07/07/18  Yes Epifanio Lesches, MD  theophylline (THEO-24) 200 MG 24 hr capsule Take 1 capsule (200 mg total) by mouth daily. 12/07/17  Yes Birdie Sons, MD  vitamin C (VITAMIN C) 250 MG tablet Take 1 tablet (250 mg total) by mouth 2 (two) times daily. 03/20/18  Yes Epifanio Lesches, MD  feeding supplement, ENSURE ENLIVE, (ENSURE ENLIVE) LIQD Take 237 mLs by mouth 2 (two) times daily between meals. Patient not taking: Reported on 07/01/2018 02/24/18   Salary, Holly Bodily D, MD  protein supplement shake (PREMIER PROTEIN) LIQD Take 325 mLs (11 oz total) by mouth 2 (two) times daily between meals. Patient not taking: Reported on 07/01/2018 03/20/18   Epifanio Lesches, MD     Results for orders placed or performed during the hospital encounter of 08/01/18 (from the past 48 hour(s))  Comprehensive metabolic panel     Status: Abnormal   Collection Time: 08/01/18 12:51 AM  Result Value Ref Range   Sodium 133 (L) 135 - 145 mmol/L   Potassium 3.7 3.5 - 5.1 mmol/L   Chloride 82 (L) 98 - 111 mmol/L   CO2 42 (H) 22 - 32 mmol/L   Glucose, Bld 102 (H) 70 - 99 mg/dL   BUN 16 8 - 23 mg/dL   Creatinine, Ser 0.51 0.44 - 1.00 mg/dL   Calcium 8.4 (L) 8.9 - 10.3 mg/dL   Total Protein 5.7 (L) 6.5 - 8.1 g/dL   Albumin 2.1 (L) 3.5 - 5.0 g/dL   AST 25 15 - 41 U/L   ALT 14 0 - 44 U/L   Alkaline Phosphatase 60 38 - 126 U/L   Total Bilirubin 0.6 0.3 - 1.2 mg/dL   GFR calc non Af Amer >60 >60 mL/min   GFR calc Af Amer >60 >60 mL/min   Anion gap 9 5 - 15    Comment: Performed at [facility], [address] [place], [place] [address]  Troponin I - Once     Status: None   Collection Time: 08/01/18 12:51 AM  Result Value Ref Range   Troponin I <0.03 <0.03 ng/mL    Comment: Performed at [facility], [address], [place], [place] [address]  Brain natriuretic peptide     Status: Abnormal   Collection Time:  08/01/18 12:51 AM  Result Value Ref Range   B Natriuretic Peptide  225.0 (H) 0.0 - 100.0 pg/mL    Comment: Performed at [facility], [address] [place], [place] [address]  CBC with Differential     Status: Abnormal   Collection Time: 08/01/18 12:51 AM  Result Value Ref Range   WBC 6.9 4.0 - 10.5 K/uL   RBC 2.31 (L) 3.87 - 5.11 MIL/uL   Hemoglobin 6.3 (L) 12.0 - 15.0 g/dL   HCT 21.9 (L) 36.0 - 46.0 %   MCV 94.8 80.0 - 100.0 fL   MCH 27.3 26.0 - 34.0 pg   MCHC 28.8 (L) 30.0 - 36.0 g/dL   RDW 16.8 (H) 11.5 - 15.5 %   Platelets 258 150 - 400 K/uL   nRBC 0.0 0.0 - 0.2 %   Neutrophils Relative % 79 %   Neutro Abs 5.4 1.7 - 7.7 K/uL   Lymphocytes Relative 4 %   Lymphs Abs 0.3 (L) 0.7 - 4.0 K/uL   Monocytes Relative 9 %   Monocytes Absolute 0.6 0.1 - 1.0 K/uL   Eosinophils Relative 0 %   Eosinophils Absolute 0.0 0.0 - 0.5 K/uL   Basophils Relative 0 %   Basophils Absolute 0.0 0.0 - 0.1 K/uL   Immature Granulocytes 8 %   Abs Immature Granulocytes 0.55 (H) 0.00 - 9.93 K/uL   Basophilic Stippling PRESENT     Comment: Performed at [facility], [address] [place], [place] [address]  Type and screen [place]     Status: None   Collection Time: 08/01/18  2:44 AM  Result Value Ref Range   ABO/RH(D) A NEG    Antibody Screen POS    Sample Expiration      08/04/2018 Performed at [facility], [address] [place], [place] [address]   Lactic acid, plasma     Status: None   Collection Time: 08/01/18  2:44 AM  Result Value Ref Range   Lactic Acid, Venous 0.8 0.5 - 1.9 mmol/L    Comment: Performed at [facility], [address], [place],  [address]   Dg Chest Portable 1 View  Result Date: 08/01/2018 CLINICAL DATA:  Respiratory distress. EXAM: PORTABLE CHEST 1 VIEW COMPARISON:  07/04/2018 FINDINGS: Shallow inspiration. Cardiac enlargement. Bilateral perihilar airspace and interstitial infiltrates likely representing edema. Multifocal pneumonia is also a possibility. No  blunting of costophrenic angles. No pneumothorax. Mediastinal contours appear intact. IMPRESSION: Cardiac enlargement with bilateral perihilar infiltrates likely representing edema. Electronically Signed   By: Lucienne Capers M.D.   On: 08/01/2018 00:51    Review of Systems  Unable to perform ROS: Acuity of condition    Blood pressure (!) 126/52, pulse 79, resp. rate 15, weight 109.5 kg, SpO2 95 %. Physical Exam  Vitals reviewed. Constitutional: She is oriented to person, place, and time. She appears well-developed and well-nourished. No distress.  HENT:  Head: Normocephalic and atraumatic.  Mouth/Throat: Oropharynx is clear and moist.  Eyes: Pupils are equal, round, and reactive to light. Conjunctivae and EOM are normal. No scleral icterus.  Neck: Normal range of motion. Neck supple. No JVD present. No tracheal deviation present. No thyromegaly present.  Cardiovascular: Normal rate, regular rhythm and normal heart sounds. Exam reveals no gallop and no friction rub.  No murmur heard. Respiratory: Effort normal and breath sounds normal.  GI: Soft. Bowel sounds are normal. She exhibits no distension. There is no abdominal tenderness.  Genitourinary:  Genitourinary Comments: Deferred   Musculoskeletal: Normal range of motion.        General: Edema present.  Lymphadenopathy:    She has no cervical adenopathy.  Neurological: She is alert and oriented to person, place, and time. No cranial nerve deficit. She exhibits normal muscle tone.  Skin: Skin is warm and dry.  Psychiatric: She has a normal mood and affect. Her behavior is normal.  Mood is pleasant and behavior appropriate however difficult to assess thought and judgment as the patient is difficulty hearing with her BiPAP mask in place and also does not seem to always follow the conversation.     Assessment/Plan This is a 78 year old female admitted for respiratory failure. 1.  Respiratory failure: Acute; with hypoxemia.  Continue  BiPAP.  Wean as work of breathing improves.  Schedule breathing treatments every 4 hours.  Continue theophylline 2.  CHF: Acute on chronic; diastolic.  Preserved ejection fraction 60 to 65% per echo last month.  BNP 225.  Reassess for further diuresis as patient weans supplemental oxygen and noninvasive ventilatory support. 3.  Hypertension: Uncontrolled; labile.  Applied Nitropaste to chest.  Labetalol as needed. 4.  Atrial fibrillation: Rate controlled; continue amiodarone, digoxin and apixaban. 5.  DVT prophylaxis: Therapeutic anticoagulation 6.  GI prophylaxis: None The patient is a DNR.  I have personally spent 45 minutes in critical care time with this patient  Harrie Foreman, MD 08/01/2018, 6:30 AM

## 2018-08-01 NOTE — ED Provider Notes (Signed)
[facility] Emergency Department Provider Note   ____________________________________________   First MD Initiated Contact with Patient 08/01/18 [id number]     (approximate)  I have reviewed the triage vital signs and the nursing notes.   HISTORY  Chief Complaint Respiratory Distress History limited by shortness of breath  HPI Vicki Morales is a 78 y.o. female who comes from [place] with COPD exacerbation.  She was apparently found short of breath.  O2 sats were in the 80s.  She is on 2 L of O2.  She went up to 95% on CPAP with EMS.  She has some rattling in her lungs.  She is really too short of breath to give much of a history   Past Medical History:  Diagnosis Date  . Anxiety   . Arthritis   . Cataract    right eye  . COPD (chronic obstructive pulmonary disease) ([place])   . Depression   . Dyspnea    DOE  . Dysrhythmia   . Edema    FEET/ANKLES  . GERD (gastroesophageal reflux disease)   . H/O wheezing   . History of hiatal hernia   . HOH (hard of hearing)   . Hypertension   . Incontinence of urine in female   . Orthopnea   . Pain    CHRONIC KNEE  . Pain    CHRONIC KNEE  . Paranoid disorder ([place])   . RLS (restless legs syndrome)   . Tremors of nervous system    HANDS    Patient Active Problem List   Diagnosis Date Noted  . Loss of appetite 07/19/2018  . Shortness of breath 07/19/2018  . Palliative care encounter 07/19/2018  . Pressure injury of skin 07/03/2018  . UTI (urinary tract infection) 07/01/2018  . Urinary retention   . Acute respiratory failure ([place]) 06/09/2018  . Acute on chronic respiratory failure with hypercapnia ([place]) 03/05/2018  . COPD exacerbation ([place]) 02/19/2018  . Diastolic dysfunction 93/81/8299  . Sepsis ([place]) 12/03/2017  . Schizophrenia, undifferentiated ([place]) 11/24/2017  . COPD with acute exacerbation ([place]) 10/24/2017  . Nocturnal hypoxia 08/21/2016  . Restless leg syndrome 08/20/2016  . Impingement  syndrome of shoulder region 07/29/2016  . Hyponatremia 03/21/2016  . Aspiration pneumonia ([place]) 03/18/2016  . History of suicide attempt 03/16/2016  . Overdose 03/16/2016  . Pulmonary hypertension ([place]) 11/11/2015  . Anxiety 09/22/2015  . Depression 09/22/2015  . Osteoarthritis 09/10/2015  . Rosacea 07/03/2015  . Breast pain 11/07/2014  . Callus of foot 11/07/2014  . CN (constipation) 11/07/2014  . Dermatitis, eczematoid 11/07/2014  . Diverticulosis of colon 11/07/2014  . Dizziness 11/07/2014  . Can't get food down 11/07/2014  . Accumulation of fluid in tissues 11/07/2014  . Fatigue 11/07/2014  . FOM (frequency of micturition) 11/07/2014  . Tension type headache 11/07/2014  . LBP (low back pain) 11/07/2014  . Episodic mood disorder ([place]) 11/07/2014  . Extreme obesity 11/07/2014  . Muscle ache 11/07/2014  . Disturbance of skin sensation 11/07/2014  . Awareness of heartbeats 11/07/2014  . Jerking 11/07/2014  . Body tinea 11/07/2014  . Essential (primary) hypertension 10/10/2014  . Moderate COPD (chronic obstructive pulmonary disease) ([place]) 04/01/2014  . Hx of obesity 04/01/2014  . CAFL (chronic airflow limitation) ([place]) 01/25/2009  . Malaise and fatigue 11/26/2008  . Aphasia 09/26/2008  . 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 GERD (gastroesophageal reflux disease) 06/07/1998  . HLD (hyperlipidemia) 06/07/1998  . OP (osteoporosis) 06/07/1998  . H/O  total hysterectomy 03/08/1987  . Schizophrenia, in remission ([place]) 06/07/1978    Past Surgical History:  Procedure Laterality Date  . ABDOMINAL HYSTERECTOMY  1988  . APPENDECTOMY  1988  . CARDIAC CATHETERIZATION    . CATARACT EXTRACTION W/PHACO Right 07/14/2016   Procedure: CATARACT EXTRACTION PHACO AND INTRAOCULAR LENS PLACEMENT ([place]) anterior vitrectomy;  Surgeon: Estill Cotta, MD;  Location: [facility] ORS;  Service: Ophthalmology;  Laterality: Right;  [place] 02:40 AP% 24.4 CDE 59.98 Fluid pack # [id number]    . CATARACT EXTRACTION W/PHACO Left 02/16/2017   Procedure: CATARACT EXTRACTION PHACO AND INTRAOCULAR LENS PLACEMENT (IOC);  Surgeon: Estill Cotta, MD;  Location: [facility] ORS;  Service: Ophthalmology;  Laterality: Left;  Lot # [id number] H [place]: 01:11.8 AP%: 23.5 CDE: 32.09   . ESOPHAGOGASTRODUODENOSCOPY N/A 11/11/2014   Procedure: ESOPHAGOGASTRODUODENOSCOPY (EGD);  Surgeon: Josefine Class, MD;  Location[facility] ENDOSCOPY;  Service: Endoscopy;  Laterality: N/A;  . TONSILLECTOMY AND ADENOIDECTOMY    . TRIGGER FINGER RELEASE  2004    Prior to Admission medications   Medication Sig Start Date End Date Taking? Authorizing Provider  acetaminophen (TYLENOL) 325 MG tablet Take 650 mg by mouth every 4 (four) hours as needed for mild pain or moderate pain.    [provider]  acidophilus (RISAQUAD) CAPS capsule Take 1 capsule by mouth 3 (three) times daily with meals. 02/24/18   Salary, Avel Peace, MD  albuterol (PROVENTIL) (2.5 MG/3ML) 0.083% nebulizer solution Take 3 mLs (2.5 mg total) by nebulization 2 (two) times daily as needed for wheezing or shortness of breath. 09/22/16   Birdie Sons, MD  amiodarone (PACERONE) 200 MG tablet Take 1 tablet (200 mg total) by mouth 2 (two) times daily. 02/24/18   Salary, Avel Peace, MD  apixaban (ELIQUIS) 5 MG TABS tablet Take 1 tablet (5 mg total) by mouth 2 (two) times daily. 02/24/18   Salary, Holly Bodily D, MD  aspirin EC 81 MG EC tablet Take 1 tablet (81 mg total) by mouth daily. 02/25/18   Salary, Holly Bodily D, MD  budesonide (PULMICORT) 0.5 MG/2ML nebulizer solution Take 2 mLs (0.5 mg total) by nebulization 2 (two) times daily. Patient not taking: Reported on 07/01/2018 03/20/18   Epifanio Lesches, MD  clonazePAM (KLONOPIN) 0.25 MG disintegrating tablet Take 1 tablet (0.25 mg total) by mouth 2 (two) times daily for 15 days. 06/23/18 07/08/18  Saundra Shelling, MD  Cranberry 250 MG CAPS Take 1 capsule by mouth daily.     [provider]  cyanocobalamin  1000 MCG tablet Take 1,000 mcg by mouth daily at 6 PM.     [provider]  digoxin (LANOXIN) 0.125 MG tablet Take 0.5 tablets (0.0625 mg total) by mouth daily. 02/24/18   Salary, Avel Peace, MD  divalproex (DEPAKOTE ER) 250 MG 24 hr tablet Take 250 mg by mouth 2 (two) times daily.    [provider]  feeding supplement, ENSURE ENLIVE, (ENSURE ENLIVE) LIQD Take 237 mLs by mouth 2 (two) times daily between meals. Patient not taking: Reported on 07/01/2018 02/24/18   Salary, Avel Peace, MD  fluticasone (FLONASE) 50 MCG/ACT nasal spray Place 1 spray into both nostrils daily.    [provider]  Fluticasone-Umeclidin-Vilant (TRELEGY ELLIPTA) 100-62.5-25 MCG/INH AEPB Inhale 1 puff into the lungs daily. 11/08/17   Birdie Sons, MD  furosemide (LASIX) 40 MG tablet Take 1 tablet (40 mg total) by mouth daily. 07/07/18   Epifanio Lesches, MD  gabapentin (NEURONTIN) 100 MG capsule Take 3 capsules (300 mg total) by  mouth 2 (two) times daily. Patient not taking: Reported on 07/01/2018 06/23/18   Saundra Shelling, MD  guaiFENesin (MUCINEX) 600 MG 12 hr tablet Take 1 tablet (600 mg total) by mouth 2 (two) times daily. 02/24/18   Salary, Avel Peace, MD  loperamide (IMODIUM) 2 MG capsule Take 1 capsule (2 mg total) by mouth as needed for diarrhea or loose stools. 02/24/18   Salary, Avel Peace, MD  magnesium oxide (MAG-OX) 400 (241.3 Mg) MG tablet Take 1 tablet (400 mg total) by mouth 2 (two) times daily. 03/20/18   Epifanio Lesches, MD  meclizine (ANTIVERT) 12.5 MG tablet Take 12.5 mg by mouth 2 (two) times daily as needed for dizziness.    [provider]  Melatonin 10 MG CAPS Take 10 mg by mouth at bedtime.     [provider]  midodrine (PROAMATINE) 10 MG tablet Take 1 tablet (10 mg total) by mouth 3 (three) times daily with meals. 03/20/18   Epifanio Lesches, MD  montelukast (SINGULAIR) 10 MG tablet TAKE 1 TABLET(10 MG) BY MOUTH EVERY NIGHT Patient taking differently:  Take 10 mg by mouth daily.  01/29/18   Birdie Sons, MD  omega-3 acid ethyl esters (LOVAZA) 1 g capsule Take 1 capsule (1 g total) by mouth daily. 03/20/18   Epifanio Lesches, MD  predniSONE (STERAPRED UNI-PAK 21 TAB) 10 MG (21) TBPK tablet Taper by 10 mg p.o. daily 07/07/18   Epifanio Lesches, MD  protein supplement shake (PREMIER PROTEIN) LIQD Take 325 mLs (11 oz total) by mouth 2 (two) times daily between meals. Patient not taking: Reported on 07/01/2018 03/20/18   Epifanio Lesches, MD  QUEtiapine (SEROQUEL XR) 50 MG TB24 24 hr tablet Take 2 tablets (100 mg total) by mouth at bedtime. 07/07/18   Epifanio Lesches, MD  Saccharomyces boulardii (PROBIOTIC) 250 MG CAPS Take 1 capsule by mouth 3 times/day as needed-between meals & bedtime. Patient not taking: Reported on 07/01/2018 02/09/18   Merlyn Lot, MD  sertraline (ZOLOFT) 100 MG tablet Take 1 tablet (100 mg total) by mouth daily. 07/07/18   Epifanio Lesches, MD  theophylline (THEO-24) 200 MG 24 hr capsule Take 1 capsule (200 mg total) by mouth daily. 12/07/17   Birdie Sons, MD  vitamin C (VITAMIN C) 250 MG tablet Take 1 tablet (250 mg total) by mouth 2 (two) times daily. Patient not taking: Reported on 07/01/2018 03/20/18   Epifanio Lesches, MD    Allergies Arthrotec [diclofenac-misoprostol]; Codeine; Diclofenac; Hydrochlorothiazide; Penicillins; Sulfa antibiotics; Tiotropium bromide monohydrate; Tylenol [acetaminophen]; Morphine; and Pantoprazole  Family History  Problem Relation Age of Onset  . Breast cancer Sister   . Stroke Father   . Emphysema Father   . Anxiety disorder Father   . Depression Father   . Anxiety disorder Brother     Social History Social History   Tobacco Use  . Smoking status: Former Smoker    Years: 30.00  . Smokeless tobacco: Never Used  . Tobacco comment: quit in 1989  Substance Use Topics  . Alcohol use: No    Alcohol/week: 0.0 standard drinks  . Drug use: No    Review  of Systems Unable to obtain  ____________________________________________   PHYSICAL EXAM:  VITAL SIGNS: ED Triage Vitals  Enc Vitals Group     BP 08/01/18 0041 127/69     Pulse Rate 08/01/18 0041 (!) 131     Resp 08/01/18 0041 (!) 23     Temp --      Temp src --  SpO2 08/01/18 0028 95 %     Weight 08/01/18 0047 241 lb 6.5 oz (109.5 kg)     Height --      Head Circumference --      Peak Flow --      Pain Score 08/01/18 0047 0     Pain Loc --      Pain Edu? --      Excl. in [place]? --     Constitutional: Alert and oriented.  Short of breath and somewhat anxious Eyes: Conjunctivae are normal.  Head: Atraumatic. Nose: No congestion/rhinnorhea. Mouth/Throat: Mucous membranes are moist.  Oropharynx non-erythematous. Neck: No stridor.  Cardiovascular: Rapid rate, regular rhythm. Grossly normal heart sounds.  Good peripheral circulation. Respiratory: Increased respiratory effort.   retractions. Lungs some rhonchi Gastrointestinal: Soft and nontender. No distention. No abdominal bruits. No CVA tenderness. Musculoskeletal: No lower extremity tenderness 1+ edema.  Both arms are swollen as well.  The left 1 seems to have some cellulitis redness and warmth along with the swelling Neurologic:  Normal speech and language. No gross focal neurologic deficits are appreciated. No gait instability. Skin:  Skin is warm, dry and intact. No rash noted.   ____________________________________________   LABS (all labs ordered are listed, but only abnormal results are displayed)  Labs Reviewed  COMPREHENSIVE METABOLIC PANEL - Abnormal; Notable for the following components:      Result Value   Sodium 133 (*)    Chloride 82 (*)    CO2 42 (*)    Glucose, Bld 102 (*)    Calcium 8.4 (*)    Total Protein 5.7 (*)    Albumin 2.1 (*)    All other components within normal limits  BRAIN NATRIURETIC PEPTIDE - Abnormal; Notable for the following components:   B Natriuretic Peptide 225.0 (*)     All other components within normal limits  CBC WITH DIFFERENTIAL/PLATELET - Abnormal; Notable for the following components:   RBC 2.31 (*)    Hemoglobin 6.3 (*)    HCT 21.9 (*)    MCHC 28.8 (*)    RDW 16.8 (*)    Lymphs Abs 0.3 (*)    Abs Immature Granulocytes 0.55 (*)    All other components within normal limits  TROPONIN I  URINALYSIS, COMPLETE (UACMP) WITH MICROSCOPIC  LACTIC ACID, PLASMA  LACTIC ACID, PLASMA  TYPE AND SCREEN   ____________________________________________  EKG  EKG read interpreted by me looks like A. fib at a rate of 108 very irregular baseline poor R wave progression no obvious ST segment elevation or depression ____________________________________________  RADIOLOGY  ED MD interpretation: Chest x-ray read by radiology reviewed by me shows cardiac enlargement with bilateral perihilar infiltrates likely representing edema.  I am also wondering if there is something in the right upper lobe like a pneumonia.  Official radiology report(s): Dg Chest Portable 1 View  Result Date: 08/01/2018 CLINICAL DATA:  Respiratory distress. EXAM: PORTABLE CHEST 1 VIEW COMPARISON:  07/04/2018 FINDINGS: Shallow inspiration. Cardiac enlargement. Bilateral perihilar airspace and interstitial infiltrates likely representing edema. Multifocal pneumonia is also a possibility. No blunting of costophrenic angles. No pneumothorax. Mediastinal contours appear intact. IMPRESSION: Cardiac enlargement with bilateral perihilar infiltrates likely representing edema. Electronically Signed   By: Lucienne Capers M.D.   On: 08/01/2018 00:51    ____________________________________________   PROCEDURES  Procedure(s) performed (including Critical Care):  Procedures   ____________________________________________   INITIAL IMPRESSION / ASSESSMENT AND PLAN / ED COURSE   Patient's chest x-ray looks like CHF  and possibly to me also pneumonia.  She is very short of breath still.  She is still  on the BiPAP although her sats are very good on the BiPAP now.  Giving her some Lasix I will give her some antibiotics as well for the apparent cellulitis in the left arm and now use Maxipime to cover the possible pneumonia.  Get her in the hospital.  She is very groggy.       ____________________________________________   FINAL CLINICAL IMPRESSION(S) / ED DIAGNOSES  Final diagnoses:  COPD exacerbation ([place])  Hypoxia  Acute on chronic congestive heart failure, unspecified heart failure type (HCC)  Anemia, unspecified type  Cellulitis of left upper extremity     ED Discharge Orders    None       Note:  This document was prepared using Dragon voice recognition software and may include unintentional dictation errors.    Nena Polio, MD 08/01/18 [phone]

## 2018-08-01 NOTE — Progress Notes (Signed)
Dr Mortimer Fries made aware of patient irregular pupul on the right and unequal pupil size. Patient able to move all extremities. Per MD no additional orders given at this time and continue to monitor.

## 2018-08-01 NOTE — ED Triage Notes (Signed)
PT presents via EMS from [facility] facility in respiratory distress. Found by NH staff, unknown amount of time pt was in distress prior to staff contact. Pt wears 2-3 L O2 per [place] at home. Pt has hx of CHF and COPD.

## 2018-08-01 NOTE — ED Notes (Signed)
Pt taken off BiPAP to give PO medications; work of breathing to 77% on 4L [place]. Pt placed on 4L oxygen to be able to give PO medications, pt able to take PO meds with apple sauce. BiPAP placed back on pt. NAD noted at this time. This RN will continue to monitor. Family at bedside.

## 2018-08-01 NOTE — ED Notes (Signed)
Lab called to inquire on the Pt's history trying to get information on the Pt receiving blood in the past. Pt does not recall any blood being received in the past.

## 2018-08-01 NOTE — Progress Notes (Signed)
Unable to complete admission profile as patient on bi-pap and no family at bedside throughout the day.

## 2018-08-01 NOTE — Progress Notes (Addendum)
Patient is currently followed by outpatient Palliative at [facility]. CMRN Joni Reining made aware. Flo Shanks BSN, RN, [facility] Clinical Liaison [facility] [phone]

## 2018-08-01 NOTE — ED Notes (Signed)
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

## 2018-08-01 NOTE — ED Notes (Signed)
ED TO INPATIENT HANDOFF REPORT  ED Nurse Name and Phone #: Anderson Malta [phone]  S Name/Age/Gender Vicki Morales 78 y.o. female Room/Bed: ED18A/ED18A  Code Status   Code Status: DNR  Home/SNF/Other Home Patient oriented to: self, place, time and situation Is this baseline? Yes   Triage Complete: Triage complete  Chief Complaint resp distress  Triage Note PT presents via EMS from [facility] facility in respiratory distress. Found by NH staff, unknown amount of time pt was in distress prior to staff contact. Pt wears 2-3 L O2 per [place] at home. Pt has hx of CHF and COPD.   Allergies Allergies  Allergen Reactions  . Arthrotec [Diclofenac-Misoprostol] Hives  . Codeine Other (See Comments)    Headache   . Diclofenac Hives and Other (See Comments)    Reaction: unknown  . Hydrochlorothiazide Other (See Comments)    Weakness   . Penicillins Hives    Has patient had a PCN reaction causing immediate rash, facial/tongue/throat swelling, SOB or lightheadedness with hypotension: No Has patient had a PCN reaction causing severe rash involving mucus membranes or skin necrosis: No Has patient had a PCN reaction that required hospitalization: No Has patient had a PCN reaction occurring within the last 10 years: No If all of the above answers are "NO", then may proceed with Cephalosporin use.  . Sulfa Antibiotics Hives  . Tiotropium Bromide Monohydrate Other (See Comments)    Blurry vision   . Tylenol [Acetaminophen] Other (See Comments)    Doesn't work  . Morphine Hives and Rash  . Pantoprazole Rash    Level of Care/Admitting Diagnosis ED Disposition    ED Disposition Condition [facility] Area: [place] [[id number]]  Level of Care: Stepdown [14]  Diagnosis: Acute respiratory failure with hypoxemia [facility]) [[id number]]  Admitting Physician: Harrie Foreman [[id number]]  Attending Physician: Harrie Foreman [[id number]]  Estimated length of  stay: past midnight tomorrow  Certification:: I certify this patient will need inpatient services for at least 2 midnights  PT Class (Do Not Modify): Inpatient [101]  PT Acc Code (Do Not Modify): Private [1]       B Medical/Surgery History Past Medical History:  Diagnosis Date  . Anxiety   . Arthritis   . Cataract    right eye  . COPD (chronic obstructive pulmonary disease) ([place])   . Depression   . Dyspnea    DOE  . Dysrhythmia   . Edema    FEET/ANKLES  . GERD (gastroesophageal reflux disease)   . H/O wheezing   . History of hiatal hernia   . HOH (hard of hearing)   . Hypertension   . Incontinence of urine in female   . Orthopnea   . Pain    CHRONIC KNEE  . Pain    CHRONIC KNEE  . Paranoid disorder ([place])   . RLS (restless legs syndrome)   . Tremors of nervous system    HANDS   Past Surgical History:  Procedure Laterality Date  . ABDOMINAL HYSTERECTOMY  1988  . APPENDECTOMY  1988  . CARDIAC CATHETERIZATION    . CATARACT EXTRACTION W/PHACO Right 07/14/2016   Procedure: CATARACT EXTRACTION PHACO AND INTRAOCULAR LENS PLACEMENT ([place]) anterior vitrectomy;  Surgeon: Estill Cotta, MD;  Location: [facility] ORS;  Service: Ophthalmology;  Laterality: Right;  [place] 02:40 AP% 24.4 CDE 59.98 Fluid pack # [id number]  . CATARACT EXTRACTION W/PHACO Left 02/16/2017   Procedure: CATARACT EXTRACTION PHACO AND INTRAOCULAR LENS PLACEMENT (  [place]);  Surgeon: Estill Cotta, MD;  Location: [facility] ORS;  Service: Ophthalmology;  Laterality: Left;  Lot # [id number] H [place]: 01:11.8 AP%: 23.5 CDE: 32.09   . ESOPHAGOGASTRODUODENOSCOPY N/A 11/11/2014   Procedure: ESOPHAGOGASTRODUODENOSCOPY (EGD);  Surgeon: Josefine Class, MD;  Location: [facility] ENDOSCOPY;  Service: Endoscopy;  Laterality: N/A;  . TONSILLECTOMY AND ADENOIDECTOMY    . TRIGGER FINGER RELEASE  2004     A IV Location/Drains/Wounds Patient Lines/Drains/Airways Status   Active Line/Drains/Airways    Name:   Placement date:   Placement  time:   Site:   Days:   Peripheral IV 08/01/18 Left Wrist   08/01/18    0028    Wrist   less than 1   Peripheral IV 08/01/18 Left;Anterior Forearm   08/01/18    0245    Forearm   less than 1   Peripheral IV 08/01/18 Right;Posterior Forearm   08/01/18    0320    Forearm   less than 1   External Urinary Catheter   07/06/18    1300    -   26   Pressure Injury 07/02/18 Stage II -  Partial thickness loss of dermis presenting as a shallow open ulcer with a red, pink wound bed without slough. two small red areas on inner right buttock, slight loss of dermis   07/02/18    0245     30   Pressure Injury 07/02/18 Stage I -  Intact skin with non-blanchable redness of a localized area usually over a bony prominence. nonblanchable redness to area of crease of bilateral buttocks to coccyx   07/02/18    0245     30          Intake/Output Last 24 hours  Intake/Output Summary (Last 24 hours) at 08/01/2018 1136 Last data filed at 08/01/2018 1039 Gross per 24 hour  Intake 300 ml  Output 300 ml  Net 0 ml    Labs/Imaging Results for orders placed or performed during the hospital encounter of 08/01/18 (from the past 48 hour(s))  Comprehensive metabolic panel     Status: Abnormal   Collection Time: 08/01/18 12:51 AM  Result Value Ref Range   Sodium 133 (L) 135 - 145 mmol/L   Potassium 3.7 3.5 - 5.1 mmol/L   Chloride 82 (L) 98 - 111 mmol/L   CO2 42 (H) 22 - 32 mmol/L   Glucose, Bld 102 (H) 70 - 99 mg/dL   BUN 16 8 - 23 mg/dL   Creatinine, Ser 0.51 0.44 - 1.00 mg/dL   Calcium 8.4 (L) 8.9 - 10.3 mg/dL   Total Protein 5.7 (L) 6.5 - 8.1 g/dL   Albumin 2.1 (L) 3.5 - 5.0 g/dL   AST 25 15 - 41 U/L   ALT 14 0 - 44 U/L   Alkaline Phosphatase 60 38 - 126 U/L   Total Bilirubin 0.6 0.3 - 1.2 mg/dL   GFR calc non Af Amer >60 >60 mL/min   GFR calc Af Amer >60 >60 mL/min   Anion gap 9 5 - 15    Comment: Performed at [facility], [address] [place], [place] [address]  Troponin I - Once     Status:  None   Collection Time: 08/01/18 12:51 AM  Result Value Ref Range   Troponin I <0.03 <0.03 ng/mL    Comment: Performed at [facility], [address], [place],  [address]  Brain natriuretic peptide     Status: Abnormal   Collection  Time: 08/01/18 12:51 AM  Result Value Ref Range   B Natriuretic Peptide 225.0 (H) 0.0 - 100.0 pg/mL    Comment: Performed at [facility], [address] [place], [place] [address]  CBC with Differential     Status: Abnormal   Collection Time: 08/01/18 12:51 AM  Result Value Ref Range   WBC 6.9 4.0 - 10.5 K/uL   RBC 2.31 (L) 3.87 - 5.11 MIL/uL   Hemoglobin 6.3 (L) 12.0 - 15.0 g/dL   HCT 21.9 (L) 36.0 - 46.0 %   MCV 94.8 80.0 - 100.0 fL   MCH 27.3 26.0 - 34.0 pg   MCHC 28.8 (L) 30.0 - 36.0 g/dL   RDW 16.8 (H) 11.5 - 15.5 %   Platelets 258 150 - 400 K/uL   nRBC 0.0 0.0 - 0.2 %   Neutrophils Relative % 79 %   Neutro Abs 5.4 1.7 - 7.7 K/uL   Lymphocytes Relative 4 %   Lymphs Abs 0.3 (L) 0.7 - 4.0 K/uL   Monocytes Relative 9 %   Monocytes Absolute 0.6 0.1 - 1.0 K/uL   Eosinophils Relative 0 %   Eosinophils Absolute 0.0 0.0 - 0.5 K/uL   Basophils Relative 0 %   Basophils Absolute 0.0 0.0 - 0.1 K/uL   Immature Granulocytes 8 %   Abs Immature Granulocytes 0.55 (H) 0.00 - 2.44 K/uL   Basophilic Stippling PRESENT     Comment: Performed at [facility], [address] [place], [place] [address]  TSH     Status: None   Collection Time: 08/01/18 12:51 AM  Result Value Ref Range   TSH 4.140 0.350 - 4.500 uIU/mL    Comment: Performed by a 3rd Generation assay with a functional sensitivity of <=0.01 uIU/mL. Performed at [facility], [address] [place], [place] [address]   Type and screen [place]     Status: None   Collection Time: 08/01/18  2:44 AM  Result Value Ref Range   ABO/RH(D) A NEG    Antibody Screen POS    Sample Expiration 08/04/2018    Antibody Identification       ANTI E ANTI C Performed at [facility], [address] [place], [place] [address]   Lactic acid, plasma     Status: None   Collection Time: 08/01/18  2:44 AM  Result Value Ref Range   Lactic Acid, Venous 0.8 0.5 - 1.9 mmol/L    Comment: Performed at [facility], [address] [place], [place] [address]  Urinalysis, Complete w Microscopic     Status: Abnormal   Collection Time: 08/01/18  7:54 AM  Result Value Ref Range   Color, Urine YELLOW (A) YELLOW   APPearance CLEAR (A) CLEAR   Specific Gravity, Urine 1.012 1.005 - 1.030   pH 5.0 5.0 - 8.0   Glucose, UA NEGATIVE NEGATIVE mg/dL   Hgb urine dipstick NEGATIVE NEGATIVE   Bilirubin Urine NEGATIVE NEGATIVE   Ketones, ur NEGATIVE NEGATIVE mg/dL   Protein, ur NEGATIVE NEGATIVE mg/dL   Nitrite NEGATIVE NEGATIVE   Leukocytes,Ua NEGATIVE NEGATIVE   RBC / HPF 0-5 0 - 5 RBC/hpf   WBC, UA 0-5 0 - 5 WBC/hpf   Bacteria, UA NONE SEEN NONE SEEN   Squamous Epithelial / LPF 0-5 0 - 5   Hyaline Casts, UA PRESENT    Uric Acid Crys, UA PRESENT     Comment: Performed at [facility], [address],  [place], [place] [address]  ABO/Rh     Status: None   Collection Time: 08/01/18 10:04 AM  Result Value Ref Range   ABO/RH(D)      A NEG Performed at [facility], [address] [place], [place] [address]    Dg Chest Portable 1 View  Result Date: 08/01/2018 CLINICAL DATA:  Respiratory distress. EXAM: PORTABLE CHEST 1 VIEW COMPARISON:  07/04/2018 FINDINGS: Shallow inspiration. Cardiac enlargement. Bilateral perihilar airspace and interstitial infiltrates likely representing edema. Multifocal pneumonia is also a possibility. No blunting of costophrenic angles. No pneumothorax. Mediastinal contours appear intact. IMPRESSION: Cardiac enlargement with bilateral perihilar infiltrates likely representing edema. Electronically Signed   By: Lucienne Capers M.D.   On: 08/01/2018 00:51    Pending Labs Unresulted  Labs (From admission, onward)   None      Vitals/Pain Today's Vitals   08/01/18 0900 08/01/18 0915 08/01/18 1030 08/01/18 1100  BP: 140/64 (!) 139/53 (!) 164/65 129/68  Pulse: 74 90 79 82  Resp: (!) 23 19 20  (!) 26  SpO2: 95% 95% 97% 95%  Weight:      PainSc:        Isolation Precautions No active isolations  Medications Medications  fluticasone (FLONASE) 50 MCG/ACT nasal spray 1 spray (has no administration in time range)  albuterol (PROVENTIL) (2.5 MG/3ML) 0.083% nebulizer solution 2.5 mg (2.5 mg Nebulization Given 08/01/18 0855)  Melatonin TABS 10 mg (has no administration in time range)  theophylline (THEO-24) 24 hr capsule 200 mg (has no administration in time range)  montelukast (SINGULAIR) tablet 10 mg (has no administration in time range)  vitamin B-12 (CYANOCOBALAMIN) tablet 1,000 mcg (has no administration in time range)  aspirin EC tablet 81 mg (has no administration in time range)  amiodarone (PACERONE) tablet 200 mg (has no administration in time range)  digoxin (LANOXIN) tablet 0.0625 mg (has no administration in time range)  acidophilus (RISAQUAD) capsule 1 capsule (has no administration in time range)  apixaban (ELIQUIS) tablet 5 mg (5 mg Oral Given 08/01/18 1013)  feeding supplement (ENSURE ENLIVE) (ENSURE ENLIVE) liquid 237 mL (237 mLs Oral Not Given 08/01/18 1047)  divalproex (DEPAKOTE ER) 24 hr tablet 250 mg (250 mg Oral Given 08/01/18 1012)  midodrine (PROAMATINE) tablet 10 mg (has no administration in time range)  magnesium oxide (MAG-OX) tablet 400 mg (400 mg Oral Given 08/01/18 1012)  protein supplement (PREMIER PROTEIN) liquid - approved for s/p bariatric surgery (11 oz Oral Not Given 08/01/18 1048)  vitamin C (ASCORBIC ACID) tablet 250 mg (has no administration in time range)  meclizine (ANTIVERT) tablet 12.5 mg (has no administration in time range)  gabapentin (NEURONTIN) capsule 300 mg (300 mg Oral Given 08/01/18 1012)  sertraline (ZOLOFT) tablet 100 mg  (100 mg Oral Given 08/01/18 1013)  azithromycin (ZITHROMAX) tablet 500 mg (500 mg Oral Given 08/01/18 1012)  busPIRone (BUSPAR) tablet 15 mg (15 mg Oral Given 08/01/18 1012)  carbamide peroxide (DEBROX) 6.5 % OTIC (EAR) solution 5 drop (5 drops Both EARS Given 08/01/18 1015)  risperiDONE (RISPERDAL) tablet 1 mg (has no administration in time range)  docusate sodium (COLACE) capsule 100 mg (100 mg Oral Not Given 08/01/18 1013)  ondansetron (ZOFRAN) tablet 4 mg (has no administration in time range)    Or  ondansetron (ZOFRAN) injection 4 mg (has no administration in time range)  fluticasone furoate-vilanterol (BREO ELLIPTA) 100-25 MCG/INH 1 puff (has no administration in time range)    And  umeclidinium bromide (INCRUSE ELLIPTA) 62.5 MCG/INH 1 puff (  has no administration in time range)  furosemide (LASIX) injection 20 mg (has no administration in time range)  ipratropium-albuterol (DUONEB) 0.5-2.5 (3) MG/3ML nebulizer solution 3 mL (3 mLs Nebulization Given 08/01/18 0059)  ipratropium-albuterol (DUONEB) 0.5-2.5 (3) MG/3ML nebulizer solution 3 mL (3 mLs Nebulization Given 08/01/18 0231)  furosemide (LASIX) injection 40 mg (40 mg Intravenous Given 08/01/18 0338)  vancomycin (VANCOCIN) IVPB 1000 mg/200 mL premix (0 mg Intravenous Stopped 08/01/18 0510)  ceFEPIme (MAXIPIME) 1 g in sodium chloride 0.9 % 100 mL IVPB (0 g Intravenous Stopped 08/01/18 0435)    Mobility walks with person assist High fall risk   Focused Assessments Cardiac Assessment Handoff:  Cardiac Rhythm: Normal sinus rhythm Lab Results  Component Value Date   CKTOTAL 21 (L) 10/12/2016   CKMB 1.9 10/31/2012   TROPONINI <0.03 08/01/2018   No results found for: DDIMER Does the Patient currently have chest pain? No  , Pulmonary Assessment Handoff:  Lung sounds: L Breath Sounds: Diminished, Rhonchi, Inspiratory wheezes R Breath Sounds: Diminished, Rhonchi, Inspiratory wheezes O2 Device: Bi-PAP        R Recommendations: See  Admitting Provider Note  Report given to:   Additional Notes:

## 2018-08-01 NOTE — Consult Note (Signed)
ANTICOAGULATION CONSULT NOTE - Initial Consult  Pharmacy Consult for Heparin Indication: atrial fibrillation  Allergies  Allergen Reactions  . Arthrotec [Diclofenac-Misoprostol] Hives  . Codeine Other (See Comments)    Headache   . Diclofenac Hives and Other (See Comments)    Reaction: unknown  . Hydrochlorothiazide Other (See Comments)    Weakness   . Penicillins Hives    Has patient had a PCN reaction causing immediate rash, facial/tongue/throat swelling, SOB or lightheadedness with hypotension: No Has patient had a PCN reaction causing severe rash involving mucus membranes or skin necrosis: No Has patient had a PCN reaction that required hospitalization: No Has patient had a PCN reaction occurring within the last 10 years: No If all of the above answers are "NO", then may proceed with Cephalosporin use.  . Sulfa Antibiotics Hives  . Tiotropium Bromide Monohydrate Other (See Comments)    Blurry vision   . Tylenol [Acetaminophen] Other (See Comments)    Doesn't work  . Morphine Hives and Rash  . Pantoprazole Rash    Patient Measurements: Weight: 241 lb 6.5 oz (109.5 kg) Heparin Dosing Weight: 82.7kg  Vital Signs: Temp: 97.6 F (36.4 C) (02/25 1300) BP: 158/67 (02/25 1819) Pulse Rate: 84 (02/25 1700)  Labs: Recent Labs    08/01/18 0051 08/01/18 1430  HGB 6.3* 8.3*  HCT 21.9*  --   PLT 258  --   CREATININE 0.51  --   TROPONINI <0.03  --     Estimated Creatinine Clearance: 70.2 mL/min (by C-G formula based on SCr of 0.51 mg/dL).   Medical History: Past Medical History:  Diagnosis Date  . Anxiety   . Arthritis   . Cataract    right eye  . COPD (chronic obstructive pulmonary disease) ([place])   . Depression   . Dyspnea    DOE  . Dysrhythmia   . Edema    FEET/ANKLES  . GERD (gastroesophageal reflux disease)   . H/O wheezing   . History of hiatal hernia   . HOH (hard of hearing)   . Hypertension   . Incontinence of urine in female   . Orthopnea   .  Pain    CHRONIC KNEE  . Pain    CHRONIC KNEE  . Paranoid disorder ([place])   . RLS (restless legs syndrome)   . Tremors of nervous system    HANDS    Medications:  Last dose Apixiban 02/25 @ 1013 AM  Assessment: Have drawn baseline APTT and INR, will start heparin drip @ 2200 (~ 12 hours after last apixiban dose)   Goal of Therapy:  Heparin level 0.3-0.7 units/ml aPTT 66-102 seconds Monitor platelets by anticoagulation protocol: Yes   Plan:  Will start heparin without a bolus 2/25 @ 2200 at a rate of 1150 units/hour  Will check APTT and Heparin level @ 8 hours and monitor CBC's daily while on heparin drip  Lu Duffel, PharmD, BCPS Clinical Pharmacist 08/01/2018 6:32 PM

## 2018-08-01 NOTE — ED Notes (Signed)
Lab called to inquire information about pt's hx of blood transfusion and pregnancy hx. Information given by pt's daughter.

## 2018-08-01 NOTE — Care Management (Signed)
Patient presents from [facility] where she is a long term care resident.  She is being admitted to ICU stepdown due to need for continuous bipap.  She is on chronic oxygen at baseline.  Outpatient palliative follow at the facility. She is currently a DNR. Notified CSW

## 2018-08-01 NOTE — ED Notes (Signed)
Family at bedside. 

## 2018-08-01 NOTE — Consult Note (Signed)
Name: Vicki Morales MRN: [id number] DOB: [birth year]     CONSULTATION DATE: 08/01/2018  REFERRING MD : Marcille Blanco  CHIEF COMPLAINT:  resp distress    HISTORY OF PRESENT ILLNESS:   78 yo obese WF sent to ER for acute mental status changes Has h/o COPD and chronic hypoxic resp failure +hypoxia +increased WOB Unable to provide ROS Placed in biPAP given Lasix  CXR with pulm edema  Patient is critically ill Patient is DNR/DNI  PAST MEDICAL HISTORY :   has a past medical history of Anxiety, Arthritis, Cataract, COPD (chronic obstructive pulmonary disease) ([place]), Depression, Dyspnea, Dysrhythmia, Edema, GERD (gastroesophageal reflux disease), H/O wheezing, History of hiatal hernia, HOH (hard of hearing), Hypertension, Incontinence of urine in female, Orthopnea, Pain, Pain, Paranoid disorder ([place]), RLS (restless legs syndrome), and Tremors of nervous system.  has a past surgical history that includes Abdominal hysterectomy (1988); Appendectomy (1988); Tonsillectomy and adenoidectomy; Trigger finger release (2004); Esophagogastroduodenoscopy (N/A, 11/11/2014); Cardiac catheterization; Cataract extraction w/PHACO (Right, 07/14/2016); and Cataract extraction w/PHACO (Left, 02/16/2017). Prior to Admission medications   Medication Sig Start Date End Date Taking? Authorizing Provider  acetaminophen (TYLENOL) 325 MG tablet Take 650 mg by mouth every 4 (four) hours as needed for mild pain or moderate pain.   Yes [provider]  acidophilus (RISAQUAD) CAPS capsule Take 1 capsule by mouth 3 (three) times daily with meals. 02/24/18  Yes Salary, Montell D, MD  albuterol (PROVENTIL) (2.5 MG/3ML) 0.083% nebulizer solution Take 3 mLs (2.5 mg total) by nebulization 2 (two) times daily as needed for wheezing or shortness of breath. Patient taking differently: Take 2.5 mg by nebulization every 6 (six) hours as needed for shortness of breath.  09/22/16  Yes Birdie Sons, MD  amiodarone (PACERONE) 200 MG  tablet Take 1 tablet (200 mg total) by mouth 2 (two) times daily. 02/24/18  Yes Salary, Avel Peace, MD  apixaban (ELIQUIS) 5 MG TABS tablet Take 1 tablet (5 mg total) by mouth 2 (two) times daily. 02/24/18  Yes Salary, Avel Peace, MD  aspirin EC 81 MG EC tablet Take 1 tablet (81 mg total) by mouth daily. 02/25/18  Yes Salary, Avel Peace, MD  azithromycin (ZITHROMAX) 500 MG tablet Take 500 mg by mouth daily. Give 1 tablet by mouth in the evening for ear infection for 10 days. 07/31/18 08/10/18 Yes [provider]  busPIRone (BUSPAR) 10 MG tablet Take 15 mg by mouth 3 (three) times daily. Related to generalized anxiety disorder for 15 days, dose is 15mg  07/17/18 08/01/18 Yes [provider]  carbamide peroxide (DEBROX) 6.5 % OTIC solution Place 5 drops into both ears daily. For clogged ears for 7 days 07/28/18 08/04/18 Yes [provider]  Cranberry 250 MG CAPS Take 1 capsule by mouth daily.    Yes [provider]  cyanocobalamin 1000 MCG tablet Take 1,000 mcg by mouth daily at 6 PM.    Yes [provider]  digoxin (LANOXIN) 0.125 MG tablet Take 0.5 tablets (0.0625 mg total) by mouth daily. 02/24/18  Yes Salary, Avel Peace, MD  divalproex (DEPAKOTE ER) 250 MG 24 hr tablet Take 250 mg by mouth 2 (two) times daily.   Yes [provider]  fluticasone (FLONASE) 50 MCG/ACT nasal spray Place 1 spray into both nostrils daily.   Yes [provider]  Fluticasone-Umeclidin-Vilant (TRELEGY ELLIPTA) 100-62.5-25 MCG/INH AEPB Inhale 1 puff into the lungs daily. 11/08/17  Yes Birdie Sons, MD  furosemide (LASIX) 40 MG tablet Take 1 tablet (  40 mg total) by mouth daily. 07/07/18  Yes Epifanio Lesches, MD  furosemide (LASIX) 40 MG tablet Take 40 mg by mouth daily. Give 1 tablet by mouth in the evening related to unspecified systolic (congestive) heart failure for 3 days. 07/30/18 08/02/18 Yes [provider]  gabapentin (NEURONTIN) 100 MG capsule Take 3 capsules  (300 mg total) by mouth 2 (two) times daily. 06/23/18  Yes Pyreddy, Reatha Harps, MD  loperamide (IMODIUM) 2 MG capsule Take 1 capsule (2 mg total) by mouth as needed for diarrhea or loose stools. Patient taking differently: Take 2 mg by mouth daily as needed for diarrhea or loose stools.  02/24/18  Yes Salary, Avel Peace, MD  magnesium oxide (MAG-OX) 400 (241.3 Mg) MG tablet Take 1 tablet (400 mg total) by mouth 2 (two) times daily. 03/20/18  Yes Epifanio Lesches, MD  meclizine (ANTIVERT) 12.5 MG tablet Take 12.5 mg by mouth 2 (two) times daily as needed for dizziness.   Yes [provider]  Melatonin 10 MG CAPS Take 10 mg by mouth at bedtime.    Yes [provider]  midodrine (PROAMATINE) 10 MG tablet Take 1 tablet (10 mg total) by mouth 3 (three) times daily with meals. 03/20/18  Yes Epifanio Lesches, MD  montelukast (SINGULAIR) 10 MG tablet TAKE 1 TABLET(10 MG) BY MOUTH EVERY NIGHT Patient taking differently: Take 10 mg by mouth at bedtime.  01/29/18  Yes Birdie Sons, MD  omega-3 acid ethyl esters (LOVAZA) 1 g capsule Take 1 capsule (1 g total) by mouth daily. 03/20/18  Yes Epifanio Lesches, MD  risperiDONE (RISPERDAL) 1 MG tablet Take 1 mg by mouth at bedtime. Related to schizophrenia 07/07/18  Yes [provider]  sertraline (ZOLOFT) 100 MG tablet Take 1 tablet (100 mg total) by mouth daily. 07/07/18  Yes Epifanio Lesches, MD  theophylline (THEO-24) 200 MG 24 hr capsule Take 1 capsule (200 mg total) by mouth daily. 12/07/17  Yes Birdie Sons, MD  vitamin C (VITAMIN C) 250 MG tablet Take 1 tablet (250 mg total) by mouth 2 (two) times daily. 03/20/18  Yes Epifanio Lesches, MD  feeding supplement, ENSURE ENLIVE, (ENSURE ENLIVE) LIQD Take 237 mLs by mouth 2 (two) times daily between meals. Patient not taking: Reported on 07/01/2018 02/24/18   Salary, Holly Bodily D, MD  protein supplement shake (PREMIER PROTEIN) LIQD Take 325 mLs (11 oz total) by mouth 2 (two)  times daily between meals. Patient not taking: Reported on 07/01/2018 03/20/18   Epifanio Lesches, MD   Allergies  Allergen Reactions  . Arthrotec [Diclofenac-Misoprostol] Hives  . Codeine Other (See Comments)    Headache   . Diclofenac Hives and Other (See Comments)    Reaction: unknown  . Hydrochlorothiazide Other (See Comments)    Weakness   . Penicillins Hives    Has patient had a PCN reaction causing immediate rash, facial/tongue/throat swelling, SOB or lightheadedness with hypotension: No Has patient had a PCN reaction causing severe rash involving mucus membranes or skin necrosis: No Has patient had a PCN reaction that required hospitalization: No Has patient had a PCN reaction occurring within the last 10 years: No If all of the above answers are "NO", then may proceed with Cephalosporin use.  . Sulfa Antibiotics Hives  . Tiotropium Bromide Monohydrate Other (See Comments)    Blurry vision   . Tylenol [Acetaminophen] Other (See Comments)    Doesn't work  . Morphine Hives and Rash  . Pantoprazole Rash    FAMILY HISTORY:  family history includes Anxiety disorder in her brother and father; Breast cancer in her sister; Depression in her father; Emphysema in her father; Stroke in her father. SOCIAL HISTORY:  reports that she has quit smoking. She quit after 30.00 years of use. She has never used smokeless tobacco. She reports that she does not drink alcohol or use drugs.  REVIEW OF SYSTEMS:   Unable to obtain due to critical illness   VITAL SIGNS: Temp:  [97.6 F (36.4 C)] 97.6 F (36.4 C) (02/25 1300) Pulse Rate:  [69-131] 69 (02/25 1300) Resp:  [12-28] 21 (02/25 1300) BP: (104-168)/(51-106) 119/59 (02/25 1300) SpO2:  [93 %-100 %] 97 % (02/25 1300) FiO2 (%):  [40 %-50 %] 50 % (02/25 0630) Weight:  [109.5 kg] 109.5 kg (02/25 0047)  Physical Examination:  GENERAL:critically ill appearing, +resp distress HEAD: Normocephalic, atraumatic.  EYES: Pupils equal,  round, reactive to light.  No scleral icterus.  MOUTH: Moist mucosal membrane. NECK: Supple. No JVD.  PULMONARY: +rhonchi, +wheezing CARDIOVASCULAR: S1 and S2. Regular rate and rhythm. No murmurs, rubs, or gallops.  GASTROINTESTINAL: Soft, nontender, -distended. No masses. Positive bowel sounds. No hepatosplenomegaly.  MUSCULOSKELETAL: No swelling, clubbing, or edema.  NEUROLOGIC: obtunded SKIN:intact,warm,dry      CULTURE RESULTS   No results found for this or any previous visit (from the past 240 hour(s)).        IMAGING    Dg Chest Portable 1 View  Result Date: 08/01/2018 CLINICAL DATA:  Respiratory distress. EXAM: PORTABLE CHEST 1 VIEW COMPARISON:  07/04/2018 FINDINGS: Shallow inspiration. Cardiac enlargement. Bilateral perihilar airspace and interstitial infiltrates likely representing edema. Multifocal pneumonia is also a possibility. No blunting of costophrenic angles. No pneumothorax. Mediastinal contours appear intact. IMPRESSION: Cardiac enlargement with bilateral perihilar infiltrates likely representing edema. Electronically Signed   By: Lucienne Capers M.D.   On: 08/01/2018 00:51        ASSESSMENT AND PLAN SYNOPSIS   Severe Hypoxic and Hypercapnic Respiratory Failure COPD exacerbation from dCHF exacerbation -continue biPAP -continue Bronchodilator Therapy   SEVERE COPD EXACERBATION -continue IV steroids as prescribed -continue NEB THERAPY as prescribed -morphine as needed -wean fio2 as needed and tolerated Check for INLFU    CARDIAC FAILURE-dCHF grade 1 -oxygen as needed -Lasix as tolerated -follow up cardiac enzymes as indicated Check ECHO  NEUROLOGY encephalopathy from resp failure Avoid sedatives  CARDIAC ICU monitoring  ID -continue IV abx as prescibed -follow up cultures  GI/Nutrition GI PROPHYLAXIS as indicated DIET-->NPO Constipation protocol as indicated  ENDO - ICU hypoglycemic\Hyperglycemia protocol -check FSBS per  protocol   ELECTROLYTES -follow labs as needed -replace as needed -pharmacy consultation and following   DVT/GI PRX ordered TRANSFUSIONS AS NEEDED MONITOR FSBS ASSESS the need for LABS as needed   Critical Care Time devoted to patient care services described in this note is 37 minutes.   Overall, patient is critically ill, prognosis is guarded.  high risk for cardiac arrest and death.    Corrin Parker, M.D.  [facility]  Medical Director [place] Director [facility] Cardio-Pulmonary Department

## 2018-08-02 LAB — CBC
HCT: 31.2 % — ABNORMAL LOW (ref 36.0–46.0)
Hemoglobin: 9.1 g/dL — ABNORMAL LOW (ref 12.0–15.0)
MCH: 27.3 pg (ref 26.0–34.0)
MCHC: 29.2 g/dL — ABNORMAL LOW (ref 30.0–36.0)
MCV: 93.7 fL (ref 80.0–100.0)
Platelets: 311 10*3/uL (ref 150–400)
RBC: 3.33 MIL/uL — ABNORMAL LOW (ref 3.87–5.11)
RDW: 16.3 % — ABNORMAL HIGH (ref 11.5–15.5)
WBC: 5.7 10*3/uL (ref 4.0–10.5)
nRBC: 0 % (ref 0.0–0.2)

## 2018-08-02 LAB — APTT
aPTT: 114 seconds — ABNORMAL HIGH (ref 24–36)
aPTT: 59 seconds — ABNORMAL HIGH (ref 24–36)

## 2018-08-02 LAB — HEPARIN LEVEL (UNFRACTIONATED): Heparin Unfractionated: 3.46 IU/mL — ABNORMAL HIGH (ref 0.30–0.70)

## 2018-08-02 MED ORDER — TRAZODONE HCL 50 MG PO TABS
50.0000 mg | ORAL_TABLET | Freq: Every evening | ORAL | Status: DC | PRN
  Administered 2018-08-02: 50 mg via ORAL
  Filled 2018-08-02: qty 1

## 2018-08-02 MED ORDER — HEPARIN (PORCINE) 25000 UT/250ML-% IV SOLN
1100.0000 [IU]/h | INTRAVENOUS | Status: DC
  Administered 2018-08-02: 1050 [IU]/h via INTRAVENOUS
  Filled 2018-08-02 (×2): qty 250

## 2018-08-02 MED ORDER — ALPRAZOLAM 0.25 MG PO TABS
0.2500 mg | ORAL_TABLET | Freq: Three times a day (TID) | ORAL | Status: DC | PRN
  Administered 2018-08-02 – 2018-08-04 (×4): 0.25 mg via ORAL
  Filled 2018-08-02 (×4): qty 1

## 2018-08-02 MED ORDER — APIXABAN 5 MG PO TABS
5.0000 mg | ORAL_TABLET | Freq: Two times a day (BID) | ORAL | Status: DC
  Filled 2018-08-02: qty 1

## 2018-08-02 NOTE — Progress Notes (Signed)
Bipap settings adjusted to maintian tidal volume and oxygen levels

## 2018-08-02 NOTE — Consult Note (Signed)
ANTICOAGULATION CONSULT NOTE - Initial Consult  Pharmacy Consult for Heparin Indication: atrial fibrillation  Allergies  Allergen Reactions  . Arthrotec [Diclofenac-Misoprostol] Hives  . Codeine Other (See Comments)    Headache   . Diclofenac Hives and Other (See Comments)    Reaction: unknown  . Hydrochlorothiazide Other (See Comments)    Weakness   . Penicillins Hives    Has patient had a PCN reaction causing immediate rash, facial/tongue/throat swelling, SOB or lightheadedness with hypotension: No Has patient had a PCN reaction causing severe rash involving mucus membranes or skin necrosis: No Has patient had a PCN reaction that required hospitalization: No Has patient had a PCN reaction occurring within the last 10 years: No If all of the above answers are "NO", then may proceed with Cephalosporin use.  . Sulfa Antibiotics Hives  . Tiotropium Bromide Monohydrate Other (See Comments)    Blurry vision   . Tylenol [Acetaminophen] Other (See Comments)    Doesn't work  . Morphine Hives and Rash  . Pantoprazole Rash    Patient Measurements: Weight: 231 lb 11.3 oz (105.1 kg) Heparin Dosing Weight: 82.7kg  Vital Signs: Temp: 97.7 F (36.5 C) (02/26 1600) Temp Source: Oral (02/26 1600) BP: 128/55 (02/26 1600) Pulse Rate: 75 (02/26 1600)  Labs: Recent Labs    08/01/18 0051 08/01/18 1430 08/01/18 1849 08/02/18 0550  HGB 6.3* 8.3*  --  9.1*  HCT 21.9*  --   --  31.2*  PLT 258  --   --  311  APTT  --   --  40* 114*  HEPARINUNFRC  --   --  >3.60* 3.46*  CREATININE 0.51  --   --   --   TROPONINI <0.03  --   --   --     Estimated Creatinine Clearance: 68.6 mL/min (by C-G formula based on SCr of 0.51 mg/dL).   Medical History: Past Medical History:  Diagnosis Date  . Anxiety   . Arthritis   . Cataract    right eye  . COPD (chronic obstructive pulmonary disease) ([place])   . Depression   . Dyspnea    DOE  . Dysrhythmia   . Edema    FEET/ANKLES  . GERD  (gastroesophageal reflux disease)   . H/O wheezing   . History of hiatal hernia   . HOH (hard of hearing)   . Hypertension   . Incontinence of urine in female   . Orthopnea   . Pain    CHRONIC KNEE  . Pain    CHRONIC KNEE  . Paranoid disorder ([place])   . RLS (restless legs syndrome)   . Tremors of nervous system    HANDS    Medications:  Last dose Apixiban 02/25 @ 1013 AM  Assessment: Have drawn baseline APTT and INR, will start heparin drip @ 2200 (~ 12 hours after last apixiban dose)   Goal of Therapy:  Heparin level 0.3-0.7 units/ml aPTT 66-102 seconds Monitor platelets by anticoagulation protocol: Yes   Plan:  Decrease rate to 1050 units/hr. Will obtain follow up aPTT at 2100. Will check anti-Xa levels daily until aPTT and anti-Xa levels correlate.   Pharmacy will continue to monitor and adjust per consult.   Simpson,Michael L, 08/02/2018 5:05 PM

## 2018-08-02 NOTE — Progress Notes (Signed)
[facility] [place] NAME: Vicki Morales    MR#:  [id number]  DATE OF BIRTH:  1941/01/02  SUBJECTIVE:   Patient confused and off of BiPAP  REVIEW OF SYSTEMS:  Unable to obtain patient appears to be confused  Tolerating Diet: yes      DRUG ALLERGIES:   Allergies  Allergen Reactions  . Arthrotec [Diclofenac-Misoprostol] Hives  . Codeine Other (See Comments)    Headache   . Diclofenac Hives and Other (See Comments)    Reaction: unknown  . Hydrochlorothiazide Other (See Comments)    Weakness   . Penicillins Hives    Has patient had a PCN reaction causing immediate rash, facial/tongue/throat swelling, SOB or lightheadedness with hypotension: No Has patient had a PCN reaction causing severe rash involving mucus membranes or skin necrosis: No Has patient had a PCN reaction that required hospitalization: No Has patient had a PCN reaction occurring within the last 10 years: No If all of the above answers are "NO", then may proceed with Cephalosporin use.  . Sulfa Antibiotics Hives  . Tiotropium Bromide Monohydrate Other (See Comments)    Blurry vision   . Tylenol [Acetaminophen] Other (See Comments)    Doesn't work  . Morphine Hives and Rash  . Pantoprazole Rash    VITALS:  Blood pressure (!) 130/55, pulse 80, temperature 97.8 F (36.6 C), temperature source Axillary, resp. rate (!) 29, weight 105.1 kg, SpO2 100 %.  PHYSICAL EXAMINATION:  Constitutional: Appears well-developed and well-nourished. No distress. HENT: Normocephalic. [name] Kitchen Oropharynx is clear and moist.  Eyes: Conjunctivae and EOM are normal. PERRLA, no scleral icterus.  Neck: Normal ROM. Neck supple. No JVD. No tracheal deviation. CVS: RRR, S1/S2 +, no murmurs, no gallops, no carotid bruit.  Pulmonary: Respiratory effort with bilateral rhonchi  abdominal: Soft. BS +,  no distension, tenderness, rebound or guarding.  Musculoskeletal: Normal range of motion.  1+ lower  extremity edema and no tenderness.  Neuro: Alert.  Seems confused CN 2-12 grossly intact. No focal deficits. Skin: Skin is warm and dry. No rash noted. Psychiatric: Confused    LABORATORY PANEL:   CBC Recent Labs  Lab 08/02/18 0550  WBC 5.7  HGB 9.1*  HCT 31.2*  PLT 311   ------------------------------------------------------------------------------------------------------------------  Chemistries  Recent Labs  Lab 08/01/18 0051  NA 133*  K 3.7  CL 82*  CO2 42*  GLUCOSE 102*  BUN 16  CREATININE 0.51  CALCIUM 8.4*  AST 25  ALT 14  ALKPHOS 60  BILITOT 0.6   ------------------------------------------------------------------------------------------------------------------  Cardiac Enzymes Recent Labs  Lab 08/01/18 0051  TROPONINI <0.03   ------------------------------------------------------------------------------------------------------------------  RADIOLOGY:  Dg Chest Portable 1 View  Result Date: 08/01/2018 CLINICAL DATA:  Respiratory distress. EXAM: PORTABLE CHEST 1 VIEW COMPARISON:  07/04/2018 FINDINGS: Shallow inspiration. Cardiac enlargement. Bilateral perihilar airspace and interstitial infiltrates likely representing edema. Multifocal pneumonia is also a possibility. No blunting of costophrenic angles. No pneumothorax. Mediastinal contours appear intact. IMPRESSION: Cardiac enlargement with bilateral perihilar infiltrates likely representing edema. Electronically Signed   By: Lucienne Capers M.D.   On: 08/01/2018 00:51     ASSESSMENT AND PLAN:   78 year old female with obesity, COPD and chronic hypoxic respiratory failure who presented with shortness of breath and increased work of breathing.  1.  Acute on chronic hypoxic respiratory failure due to influenza A with acute exacerbation COPD and CHF exacerbation: Patient has been weaned off of BiPAP  2.  Severe COPD exacerbation: Continue IV  steroids, nebs and inhalers Continue theophylline 3.  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  PAF: Consider changing heparin back to Eliquis Continue digoxin and amiodarone  5.  Anxiety/depression: Continue BuSpar, Depakote, Zoloft, Risperdal  6.  Chronic anemia: Hemoglobin relatively stable  7.  Acute encephalopathy in the setting of above issues: Continue to monitor     CODE STATUS: DNR  TOTAL TIME TAKING CARE OF THIS PATIENT: 40 minutes.     POSSIBLE D/C 2-3 days, DEPENDING ON CLINICAL CONDITION.   Marcayla Budge M.D on 08/02/2018 at 1:38 PM  Between 7am to 6pm - Pager - [phone] After 6pm go to [email or web address] - password [email or web address] [facility]  Office  [phone]  CC: Primary care physician; Birdie Sons, MD  Note: This dictation was prepared with Dragon dictation along with smaller phrase technology. Any transcriptional errors that result from this process are unintentional.

## 2018-08-02 NOTE — Progress Notes (Signed)
ANTICOAGULATION CONSULT NOTE - Initial Consult  Pharmacy Consult for Heparin  Indication: atrial fibrillation  Allergies  Allergen Reactions  . Arthrotec [Diclofenac-Misoprostol] Hives  . Codeine Other (See Comments)    Headache   . Diclofenac Hives and Other (See Comments)    Reaction: unknown  . Hydrochlorothiazide Other (See Comments)    Weakness   . Penicillins Hives    Has patient had a PCN reaction causing immediate rash, facial/tongue/throat swelling, SOB or lightheadedness with hypotension: No Has patient had a PCN reaction causing severe rash involving mucus membranes or skin necrosis: No Has patient had a PCN reaction that required hospitalization: No Has patient had a PCN reaction occurring within the last 10 years: No If all of the above answers are "NO", then may proceed with Cephalosporin use.  . Sulfa Antibiotics Hives  . Tiotropium Bromide Monohydrate Other (See Comments)    Blurry vision   . Tylenol [Acetaminophen] Other (See Comments)    Doesn't work  . Morphine Hives and Rash  . Pantoprazole Rash    Patient Measurements: Weight: 231 lb 11.3 oz (105.1 kg) Heparin Dosing Weight: 82.7 kg   Vital Signs: Temp: 98.3 F (36.8 C) (02/26 2000) Temp Source: Oral (02/26 1600) BP: 110/48 (02/26 2000) Pulse Rate: 66 (02/26 2000)  Labs: Recent Labs    08/01/18 0051 08/01/18 1430 08/01/18 1849 08/02/18 0550 08/02/18 2049  HGB 6.3* 8.3*  --  9.1*  --   HCT 21.9*  --   --  31.2*  --   PLT 258  --   --  311  --   APTT  --   --  40* 114* 59*  HEPARINUNFRC  --   --  >3.60* 3.46*  --   CREATININE 0.51  --   --   --   --   TROPONINI <0.03  --   --   --   --     Estimated Creatinine Clearance: 68.6 mL/min (by C-G formula based on SCr of 0.51 mg/dL).   Medical History: Past Medical History:  Diagnosis Date  . Anxiety   . Arthritis   . Cataract    right eye  . COPD (chronic obstructive pulmonary disease) ([place])   . Depression   . Dyspnea    DOE  .  Dysrhythmia   . Edema    FEET/ANKLES  . GERD (gastroesophageal reflux disease)   . H/O wheezing   . History of hiatal hernia   . HOH (hard of hearing)   . Hypertension   . Incontinence of urine in female   . Orthopnea   . Pain    CHRONIC KNEE  . Pain    CHRONIC KNEE  . Paranoid disorder ([place])   . RLS (restless legs syndrome)   . Tremors of nervous system    HANDS    Medications:  Medications Prior to Admission  Medication Sig Dispense Refill Last Dose  . acetaminophen (TYLENOL) 325 MG tablet Take 650 mg by mouth every 4 (four) hours as needed for mild pain or moderate pain.   prn at prn  . acidophilus (RISAQUAD) CAPS capsule Take 1 capsule by mouth 3 (three) times daily with meals. 60 capsule 0 07/01/2018 at Unknown time  . albuterol (PROVENTIL) (2.5 MG/3ML) 0.083% nebulizer solution Take 3 mLs (2.5 mg total) by nebulization 2 (two) times daily as needed for wheezing or shortness of breath. (Patient taking differently: Take 2.5 mg by nebulization every 6 (six) hours as needed for shortness of breath. )  75 mL 5 prn at prn  . amiodarone (PACERONE) 200 MG tablet Take 1 tablet (200 mg total) by mouth 2 (two) times daily. 60 tablet 0 07/01/2018 at 1000  . apixaban (ELIQUIS) 5 MG TABS tablet Take 1 tablet (5 mg total) by mouth 2 (two) times daily. 60 tablet 0 07/01/2018 at 1000  . aspirin EC 81 MG EC tablet Take 1 tablet (81 mg total) by mouth daily. 30 tablet 0 07/01/2018 at Unknown time  . azithromycin (ZITHROMAX) 500 MG tablet Take 500 mg by mouth daily. Give 1 tablet by mouth in the evening for ear infection for 10 days.     . [EXPIRED] busPIRone (BUSPAR) 10 MG tablet Take 15 mg by mouth 3 (three) times daily. Related to generalized anxiety disorder for 15 days, dose is 15mg      . carbamide peroxide (DEBROX) 6.5 % OTIC solution Place 5 drops into both ears daily. For clogged ears for 7 days     . Cranberry 250 MG CAPS Take 1 capsule by mouth daily.    07/01/2018 at Unknown time  .  cyanocobalamin 1000 MCG tablet Take 1,000 mcg by mouth daily at 6 PM.    07/01/2018 at Unknown time  . digoxin (LANOXIN) 0.125 MG tablet Take 0.5 tablets (0.0625 mg total) by mouth daily. 15 tablet 0 07/01/2018 at Unknown time  . divalproex (DEPAKOTE ER) 250 MG 24 hr tablet Take 250 mg by mouth 2 (two) times daily.   07/01/2018 at 1000  . fluticasone (FLONASE) 50 MCG/ACT nasal spray Place 1 spray into both nostrils daily.   07/01/2018 at Unknown time  . Fluticasone-Umeclidin-Vilant (TRELEGY ELLIPTA) 100-62.5-25 MCG/INH AEPB Inhale 1 puff into the lungs daily. 1 each 2 07/01/2018 at Unknown time  . furosemide (LASIX) 40 MG tablet Take 1 tablet (40 mg total) by mouth daily. 30 tablet 0   . furosemide (LASIX) 40 MG tablet Take 40 mg by mouth daily. Give 1 tablet by mouth in the evening related to unspecified systolic (congestive) heart failure for 3 days.     [name] Kitchen gabapentin (NEURONTIN) 100 MG capsule Take 3 capsules (300 mg total) by mouth 2 (two) times daily. 30 capsule 0 Not Taking at Unknown time  . loperamide (IMODIUM) 2 MG capsule Take 1 capsule (2 mg total) by mouth as needed for diarrhea or loose stools. (Patient taking differently: Take 2 mg by mouth daily as needed for diarrhea or loose stools. ) 15 capsule 0 prn at prn  . magnesium oxide (MAG-OX) 400 (241.3 Mg) MG tablet Take 1 tablet (400 mg total) by mouth 2 (two) times daily. 30 tablet 0 07/01/2018 at am  . meclizine (ANTIVERT) 12.5 MG tablet Take 12.5 mg by mouth 2 (two) times daily as needed for dizziness.   prn at prn  . Melatonin 10 MG CAPS Take 10 mg by mouth at bedtime.    06/30/2018 at Unknown time  . midodrine (PROAMATINE) 10 MG tablet Take 1 tablet (10 mg total) by mouth 3 (three) times daily with meals. 60 tablet 0 07/01/2018 at 1700  . montelukast (SINGULAIR) 10 MG tablet TAKE 1 TABLET(10 MG) BY MOUTH EVERY NIGHT (Patient taking differently: Take 10 mg by mouth at bedtime. ) 90 tablet 4 07/01/2018 at Unknown time  . omega-3 acid ethyl esters  (LOVAZA) 1 g capsule Take 1 capsule (1 g total) by mouth daily. 30 capsule 0 07/01/2018 at Unknown time  . risperiDONE (RISPERDAL) 1 MG tablet Take 1 mg by mouth at bedtime. Related  to schizophrenia     . sertraline (ZOLOFT) 100 MG tablet Take 1 tablet (100 mg total) by mouth daily. 30 tablet 0   . theophylline (THEO-24) 200 MG 24 hr capsule Take 1 capsule (200 mg total) by mouth daily. 30 capsule 5 07/01/2018 at Unknown time  . vitamin C (VITAMIN C) 250 MG tablet Take 1 tablet (250 mg total) by mouth 2 (two) times daily. 30 tablet 0 Not Taking at Unknown time  . feeding supplement, ENSURE ENLIVE, (ENSURE ENLIVE) LIQD Take 237 mLs by mouth 2 (two) times daily between meals. (Patient not taking: Reported on 07/01/2018) 90 Bottle 0 Not Taking at Unknown time  . protein supplement shake (PREMIER PROTEIN) LIQD Take 325 mLs (11 oz total) by mouth 2 (two) times daily between meals. (Patient not taking: Reported on 07/01/2018) 30 Can 0 Not Taking at Unknown time    Assessment: Have drawn baseline APTT and INR, will start heparin drip @ 2200 (~ 12 hours after last apixiban dose)   Goal of Therapy:  Heparin level 0.3-0.7 units/ml aPTT 66-102 seconds Monitor platelets by anticoagulation protocol: Yes   Plan:   Decrease rate to 1050 units/hr. Will obtain follow up aPTT at 2100. Will check anti-Xa levels daily until aPTT and anti-Xa levels correlate.   2/26: aPTT @ 2049 = 59 Will increase heparin gtt rate to 1150 units/hr and recheck aPTT and HL on 2/27 with AM labs.   [place] Shannahan D 08/02/2018,9:29 PM

## 2018-08-03 ENCOUNTER — Inpatient Hospital Stay: Payer: Medicare HMO

## 2018-08-03 DIAGNOSIS — J441 Chronic obstructive pulmonary disease with (acute) exacerbation: Secondary | ICD-10-CM

## 2018-08-03 LAB — CBC
HCT: 28 % — ABNORMAL LOW (ref 36.0–46.0)
Hemoglobin: 8.4 g/dL — ABNORMAL LOW (ref 12.0–15.0)
MCH: 27.5 pg (ref 26.0–34.0)
MCHC: 30 g/dL (ref 30.0–36.0)
MCV: 91.8 fL (ref 80.0–100.0)
Platelets: 311 10*3/uL (ref 150–400)
RBC: 3.05 MIL/uL — ABNORMAL LOW (ref 3.87–5.11)
RDW: 16.8 % — ABNORMAL HIGH (ref 11.5–15.5)
WBC: 5.4 10*3/uL (ref 4.0–10.5)
nRBC: 0.4 % — ABNORMAL HIGH (ref 0.0–0.2)

## 2018-08-03 LAB — BASIC METABOLIC PANEL WITH GFR
Anion gap: 11 (ref 5–15)
BUN: 30 mg/dL — ABNORMAL HIGH (ref 8–23)
CO2: 44 mmol/L — ABNORMAL HIGH (ref 22–32)
Calcium: 8.7 mg/dL — ABNORMAL LOW (ref 8.9–10.3)
Chloride: 80 mmol/L — ABNORMAL LOW (ref 98–111)
Creatinine, Ser: 0.82 mg/dL (ref 0.44–1.00)
GFR calc Af Amer: >60 mL/min
GFR calc non Af Amer: >60 mL/min
Glucose, Bld: 98 mg/dL (ref 70–99)
Potassium: 3.7 mmol/L (ref 3.5–5.1)
Sodium: 135 mmol/L (ref 135–145)

## 2018-08-03 LAB — APTT
aPTT: 105 s — ABNORMAL HIGH (ref 24–36)
aPTT: 72 s — ABNORMAL HIGH (ref 24–36)
aPTT: 79 seconds — ABNORMAL HIGH (ref 24–36)

## 2018-08-03 LAB — HEPARIN LEVEL (UNFRACTIONATED): Heparin Unfractionated: 1.61 IU/mL — ABNORMAL HIGH (ref 0.30–0.70)

## 2018-08-03 MED ORDER — METHYLPREDNISOLONE SODIUM SUCC 40 MG IJ SOLR
40.0000 mg | Freq: Two times a day (BID) | INTRAMUSCULAR | Status: DC
  Administered 2018-08-03 – 2018-08-04 (×2): 40 mg via INTRAVENOUS
  Filled 2018-08-03 (×2): qty 1

## 2018-08-03 MED ORDER — FUROSEMIDE 10 MG/ML IJ SOLN
40.0000 mg | Freq: Once | INTRAMUSCULAR | Status: AC
  Administered 2018-08-03: 40 mg via INTRAVENOUS
  Filled 2018-08-03: qty 4

## 2018-08-03 MED ORDER — SODIUM CHLORIDE 0.9 % IV BOLUS
1000.0000 mL | Freq: Once | INTRAVENOUS | Status: AC
  Administered 2018-08-03: 1000 mL via INTRAVENOUS

## 2018-08-03 NOTE — Progress Notes (Signed)
SLP Cancellation Note  Patient Details Name: Vicki Morales MRN: [id number] DOB: Dec 29, 1940   Cancelled treatment:       Reason Eval/Treat Not Completed: Patient not medically ready;Medical issues which prohibited therapy(reviewed chart notes). Pt has remained on BiPAP today unable to wean at the time. Due to this and pt's baseline acute on chronic hypoxic respiratory failure, pt is at increased risk for aspiration - would hold any po's at this time d/t risk for aspiration if SOB while attempting swallowing of po's. Recommend oral care for hygiene and stimulation of swallowing. ST services will f/u tomorrow w/ pt's status.      Orinda Kenner, [place], CCC-SLP Watson,Katherine 08/03/2018, 6:35 PM

## 2018-08-03 NOTE — Progress Notes (Signed)
No charge note:   Thank you for this consult.    Meeting scheduled for tomorrow 2/28 at 1230.  Mariana Kaufman, AGNP-C Palliative Medicine  Please call Palliative Medicine team phone with any questions [phone]. For individual providers please see AMION.

## 2018-08-03 NOTE — Progress Notes (Signed)
[place] [facility] [place] NAME: Vicki Morales    MR#:  [id number]  DATE OF BIRTH:  [birth year]  SUBJECTIVE:   Patient back on BIPAP due to resp distress yesterday   REVIEW OF SYSTEMS:  Unable to obtain patient appears to be confused on bipap  Tolerating Diet: yes      DRUG ALLERGIES:   Allergies  Allergen Reactions  . Arthrotec [Diclofenac-Misoprostol] Hives  . Codeine Other (See Comments)    Headache   . Diclofenac Hives and Other (See Comments)    Reaction: unknown  . Hydrochlorothiazide Other (See Comments)    Weakness   . Penicillins Hives    Has patient had a PCN reaction causing immediate rash, facial/tongue/throat swelling, SOB or lightheadedness with hypotension: No Has patient had a PCN reaction causing severe rash involving mucus membranes or skin necrosis: No Has patient had a PCN reaction that required hospitalization: No Has patient had a PCN reaction occurring within the last 10 years: No If all of the above answers are "NO", then may proceed with Cephalosporin use.  . Sulfa Antibiotics Hives  . Tiotropium Bromide Monohydrate Other (See Comments)    Blurry vision   . Tylenol [Acetaminophen] Other (See Comments)    Doesn't work  . Morphine Hives and Rash  . Pantoprazole Rash    VITALS:  Blood pressure (!) 158/63, pulse 79, temperature 98.5 F (36.9 C), temperature source Axillary, resp. rate (!) 24, weight 105.1 kg, SpO2 95 %.  PHYSICAL EXAMINATION:  Constitutional: Appears well-developed and well-nourished. No distress.on bipap HENT: Normocephalic. . On bipap Eyes: Conjunctivae are normal. PERRLA, no scleral icterus.  Neck: Normal ROM. Neck supple. No JVD. No tracheal deviation. CVS: RRR, S1/S2 +, no murmurs, no gallops, no carotid bruit.  Pulmonary: Respiratory effort with bilateral rhonchi  abdominal: Soft. BS +,  no distension, tenderness, rebound or guarding.  Musculoskeletal: Normal range of motion.  1+  lower extremity edema and no tenderness.  Neuro: No focal deficits. Skin: Skin is warm and dry. No rash noted. Psychiatric: Confused    LABORATORY PANEL:   CBC Recent Labs  Lab 08/03/18 0532  WBC 5.4  HGB 8.4*  HCT 28.0*  PLT 311   ------------------------------------------------------------------------------------------------------------------  Chemistries  Recent Labs  Lab 08/01/18 0051 08/03/18 0532  NA 133* 135  K 3.7 3.7  CL 82* 80*  CO2 42* 44*  GLUCOSE 102* 98  BUN 16 30*  CREATININE 0.51 0.82  CALCIUM 8.4* 8.7*  AST 25  --   ALT 14  --   ALKPHOS 60  --   BILITOT 0.6  --    ------------------------------------------------------------------------------------------------------------------  Cardiac Enzymes Recent Labs  Lab 08/01/18 0051  TROPONINI <0.03   ------------------------------------------------------------------------------------------------------------------  RADIOLOGY:  Dg Chest Port 1 View  Result Date: 08/03/2018 CLINICAL DATA:  [age]-year-old female with acute respiratory distress EXAM: PORTABLE CHEST 1 VIEW COMPARISON:  . Chest radiograph dated 08/01/2018 FINDINGS: There is cardiomegaly with vascular congestion and probable mild interstitial edema. Small left pleural effusion with associated left lung base atelectasis. Pneumonia is not excluded. Clinical correlation is recommended. No pneumothorax. No acute osseous pathology. IMPRESSION: Cardiomegaly with findings of CHF and small left pleural effusion. Pneumonia is not excluded. Electronically Signed   By: Anner Crete M.D.   On: 08/03/2018 05:06     ASSESSMENT AND PLAN:   78 year old female with obesity, COPD and chronic hypoxic respiratory failure who presented with shortness of breath and increased work of breathing.  1.  Acute on chronic hypoxic respiratory failure due to influenza A with acute exacerbation COPD and CHF exacerbation:  Continue Bipap as per intensivist Continue  IV steroids and IV LAsix  2.  Severe COPD exacerbation: Continue IV steroids, nebs and inhalers Continue theophylline  3.  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  PAF: Continue heparin gtt ( on eliquis at home) Continue digoxin and amiodarone  5.  Anxiety/depression: Continue BuSpar, Depakote, Zoloft, Risperdal  6.  Chronic anemia: Hemoglobin relatively stable  7.  Acute encephalopathy in the setting of above issues: Continue to monitor   D/w nursing  CODE STATUS: DNR  TOTAL TIME TAKING CARE OF THIS PATIENT: 28 minutes.     POSSIBLE D/C 2-3 days, DEPENDING ON CLINICAL CONDITION.   Nehemiah Mcfarren M.D on 08/03/2018 at 12:12 PM  Between 7am to 6pm - Pager - [phone] After 6pm go to [email or web address] - password [email or web address] [facility]  Office  [phone]  CC: Primary care physician; Birdie Sons, MD  Note: This dictation was prepared with Dragon dictation along with smaller phrase technology. Any transcriptional errors that result from this process are unintentional.

## 2018-08-03 NOTE — Care Management Note (Signed)
Case Management Note  Patient Details  Name: Vicki Morales MRN: [id number] Date of Birth: 1941/03/13  Subjective/Objective:   Patient admitted with acute respiratory failure and found to be flu positive.  Patient is on acute O2 reports that at baseline she does not wear oxygen.  Patient is from [facility] [facility], frequent admissions noted for COPD exacerbations.  Patient is awake and alert waiting for her daughter to arrive.  RNCM attempted to contact patient's daughter with no answer but the bedside RN reports daughter should be here shortly.  RNCM will cont to follow patient progress, followed by OP Palliative- Flo Shanks with [facility] aware of admission and inpatient palliative consult has been placed. Doran Clay RN BSN Care Management [phone]                   Action/Plan:   Expected Discharge Date:                  Expected Discharge Plan:  [place]  In-House Referral:     Discharge planning Services  CM Consult  Post Acute Care Choice:    Choice offered to:     DME Arranged:    DME Agency:     HH Arranged:    [place] Agency:     Status of Service:  In process, will continue to follow  If discussed at Long Length of Stay Meetings, dates discussed:    Additional Comments:  Shelbie Hutching, RN 08/03/2018, 11:33 AM

## 2018-08-03 NOTE — Progress Notes (Signed)
ANTICOAGULATION CONSULT NOTE - Initial Consult  Pharmacy Consult for Heparin  Indication: atrial fibrillation  Allergies  Allergen Reactions  . Arthrotec [Diclofenac-Misoprostol] Hives  . Codeine Other (See Comments)    Headache   . Diclofenac Hives and Other (See Comments)    Reaction: unknown  . Hydrochlorothiazide Other (See Comments)    Weakness   . Penicillins Hives    Has patient had a PCN reaction causing immediate rash, facial/tongue/throat swelling, SOB or lightheadedness with hypotension: No Has patient had a PCN reaction causing severe rash involving mucus membranes or skin necrosis: No Has patient had a PCN reaction that required hospitalization: No Has patient had a PCN reaction occurring within the last 10 years: No If all of the above answers are "NO", then may proceed with Cephalosporin use.  . Sulfa Antibiotics Hives  . Tiotropium Bromide Monohydrate Other (See Comments)    Blurry vision   . Tylenol [Acetaminophen] Other (See Comments)    Doesn't work  . Morphine Hives and Rash  . Pantoprazole Rash    Patient Measurements: Weight: 231 lb 11.3 oz (105.1 kg) Heparin Dosing Weight: 82.7 kg   Vital Signs: Temp: 98.6 F (37 C) (02/27 1200) Temp Source: Axillary (02/27 1200) BP: 115/51 (02/27 1200) Pulse Rate: 82 (02/27 1629)  Labs: Recent Labs    08/01/18 0051  08/01/18 1849 08/02/18 0550 08/02/18 2049 08/03/18 0532 08/03/18 1502  HGB 6.3*   < >  --  9.1*  --  8.4*  --   HCT 21.9*  --   --  31.2*  --  28.0*  --   PLT 258  --   --  311  --  311  --   APTT  --    < > 40* 114* 59* 105* 72*  HEPARINUNFRC  --   --  >3.60* 3.46*  --  1.61*  --   CREATININE 0.51  --   --   --   --  0.82  --   TROPONINI <0.03  --   --   --   --   --   --    < > = values in this interval not displayed.    Estimated Creatinine Clearance: 66.9 mL/min (by C-G formula based on SCr of 0.82 mg/dL).   Medical History: Past Medical History:  Diagnosis Date  . Anxiety   .  Arthritis   . Cataract    right eye  . COPD (chronic obstructive pulmonary disease) ([place])   . Depression   . Dyspnea    DOE  . Dysrhythmia   . Edema    FEET/ANKLES  . GERD (gastroesophageal reflux disease)   . H/O wheezing   . History of hiatal hernia   . HOH (hard of hearing)   . Hypertension   . Incontinence of urine in female   . Orthopnea   . Pain    CHRONIC KNEE  . Pain    CHRONIC KNEE  . Paranoid disorder ([place])   . RLS (restless legs syndrome)   . Tremors of nervous system    HANDS    Medications:  Medications Prior to Admission  Medication Sig Dispense Refill Last Dose  . acetaminophen (TYLENOL) 325 MG tablet Take 650 mg by mouth every 4 (four) hours as needed for mild pain or moderate pain.   prn at prn  . acidophilus (RISAQUAD) CAPS capsule Take 1 capsule by mouth 3 (three) times daily with meals. 60 capsule 0 07/01/2018 at Unknown time  .  albuterol (PROVENTIL) (2.5 MG/3ML) 0.083% nebulizer solution Take 3 mLs (2.5 mg total) by nebulization 2 (two) times daily as needed for wheezing or shortness of breath. (Patient taking differently: Take 2.5 mg by nebulization every 6 (six) hours as needed for shortness of breath. ) 75 mL 5 prn at prn  . amiodarone (PACERONE) 200 MG tablet Take 1 tablet (200 mg total) by mouth 2 (two) times daily. 60 tablet 0 07/01/2018 at 1000  . apixaban (ELIQUIS) 5 MG TABS tablet Take 1 tablet (5 mg total) by mouth 2 (two) times daily. 60 tablet 0 07/01/2018 at 1000  . aspirin EC 81 MG EC tablet Take 1 tablet (81 mg total) by mouth daily. 30 tablet 0 07/01/2018 at Unknown time  . azithromycin (ZITHROMAX) 500 MG tablet Take 500 mg by mouth daily. Give 1 tablet by mouth in the evening for ear infection for 10 days.     . [EXPIRED] busPIRone (BUSPAR) 10 MG tablet Take 15 mg by mouth 3 (three) times daily. Related to generalized anxiety disorder for 15 days, dose is 15mg      . carbamide peroxide (DEBROX) 6.5 % OTIC solution Place 5 drops into both ears  daily. For clogged ears for 7 days     . Cranberry 250 MG CAPS Take 1 capsule by mouth daily.    07/01/2018 at Unknown time  . cyanocobalamin 1000 MCG tablet Take 1,000 mcg by mouth daily at 6 PM.    07/01/2018 at Unknown time  . digoxin (LANOXIN) 0.125 MG tablet Take 0.5 tablets (0.0625 mg total) by mouth daily. 15 tablet 0 07/01/2018 at Unknown time  . divalproex (DEPAKOTE ER) 250 MG 24 hr tablet Take 250 mg by mouth 2 (two) times daily.   07/01/2018 at 1000  . fluticasone (FLONASE) 50 MCG/ACT nasal spray Place 1 spray into both nostrils daily.   07/01/2018 at Unknown time  . Fluticasone-Umeclidin-Vilant (TRELEGY ELLIPTA) 100-62.5-25 MCG/INH AEPB Inhale 1 puff into the lungs daily. 1 each 2 07/01/2018 at Unknown time  . furosemide (LASIX) 40 MG tablet Take 1 tablet (40 mg total) by mouth daily. 30 tablet 0   . furosemide (LASIX) 40 MG tablet Take 40 mg by mouth daily. Give 1 tablet by mouth in the evening related to unspecified systolic (congestive) heart failure for 3 days.     [name] Kitchen gabapentin (NEURONTIN) 100 MG capsule Take 3 capsules (300 mg total) by mouth 2 (two) times daily. 30 capsule 0 Not Taking at Unknown time  . loperamide (IMODIUM) 2 MG capsule Take 1 capsule (2 mg total) by mouth as needed for diarrhea or loose stools. (Patient taking differently: Take 2 mg by mouth daily as needed for diarrhea or loose stools. ) 15 capsule 0 prn at prn  . magnesium oxide (MAG-OX) 400 (241.3 Mg) MG tablet Take 1 tablet (400 mg total) by mouth 2 (two) times daily. 30 tablet 0 07/01/2018 at am  . meclizine (ANTIVERT) 12.5 MG tablet Take 12.5 mg by mouth 2 (two) times daily as needed for dizziness.   prn at prn  . Melatonin 10 MG CAPS Take 10 mg by mouth at bedtime.    06/30/2018 at Unknown time  . midodrine (PROAMATINE) 10 MG tablet Take 1 tablet (10 mg total) by mouth 3 (three) times daily with meals. 60 tablet 0 07/01/2018 at 1700  . montelukast (SINGULAIR) 10 MG tablet TAKE 1 TABLET(10 MG) BY MOUTH EVERY NIGHT  (Patient taking differently: Take 10 mg by mouth at bedtime. ) 90 tablet  4 07/01/2018 at Unknown time  . omega-3 acid ethyl esters (LOVAZA) 1 g capsule Take 1 capsule (1 g total) by mouth daily. 30 capsule 0 07/01/2018 at Unknown time  . risperiDONE (RISPERDAL) 1 MG tablet Take 1 mg by mouth at bedtime. Related to schizophrenia     . sertraline (ZOLOFT) 100 MG tablet Take 1 tablet (100 mg total) by mouth daily. 30 tablet 0   . theophylline (THEO-24) 200 MG 24 hr capsule Take 1 capsule (200 mg total) by mouth daily. 30 capsule 5 07/01/2018 at Unknown time  . vitamin C (VITAMIN C) 250 MG tablet Take 1 tablet (250 mg total) by mouth 2 (two) times daily. 30 tablet 0 Not Taking at Unknown time  . feeding supplement, ENSURE ENLIVE, (ENSURE ENLIVE) LIQD Take 237 mLs by mouth 2 (two) times daily between meals. (Patient not taking: Reported on 07/01/2018) 90 Bottle 0 Not Taking at Unknown time  . protein supplement shake (PREMIER PROTEIN) LIQD Take 325 mLs (11 oz total) by mouth 2 (two) times daily between meals. (Patient not taking: Reported on 07/01/2018) 30 Can 0 Not Taking at Unknown time    Assessment: Have drawn baseline APTT and INR, will start heparin drip @ 2200 (~ 12 hours after last apixiban dose). Patient remains on BiPAP.   Goal of Therapy:  Heparin level 0.3-0.7 units/ml aPTT 66-102 seconds Monitor platelets by anticoagulation protocol: Yes   Plan:   Continue heparin at 1100 units/hr. Will obtain follow aptt at 2300.   Will check anti-Xa levels daily until aPTT and anti-Xa levels correlate.   Pharmacy will continue to monitor and adjust per consult.   MLS 08/03/2018

## 2018-08-03 NOTE — Progress Notes (Signed)
ANTICOAGULATION CONSULT NOTE - Initial Consult  Pharmacy Consult for Heparin  Indication: atrial fibrillation  Allergies  Allergen Reactions  . Arthrotec [Diclofenac-Misoprostol] Hives  . Codeine Other (See Comments)    Headache   . Diclofenac Hives and Other (See Comments)    Reaction: unknown  . Hydrochlorothiazide Other (See Comments)    Weakness   . Penicillins Hives    Has patient had a PCN reaction causing immediate rash, facial/tongue/throat swelling, SOB or lightheadedness with hypotension: No Has patient had a PCN reaction causing severe rash involving mucus membranes or skin necrosis: No Has patient had a PCN reaction that required hospitalization: No Has patient had a PCN reaction occurring within the last 10 years: No If all of the above answers are "NO", then may proceed with Cephalosporin use.  . Sulfa Antibiotics Hives  . Tiotropium Bromide Monohydrate Other (See Comments)    Blurry vision   . Tylenol [Acetaminophen] Other (See Comments)    Doesn't work  . Morphine Hives and Rash  . Pantoprazole Rash    Patient Measurements: Weight: 231 lb 11.3 oz (105.1 kg) Heparin Dosing Weight: 82.7 kg   Vital Signs: Temp: 98.1 F (36.7 C) (02/27 0400) BP: 115/56 (02/27 0600) Pulse Rate: 72 (02/27 0600)  Labs: Recent Labs    08/01/18 0051  08/01/18 1849 08/02/18 0550 08/02/18 2049 08/03/18 0532  HGB 6.3*   < >  --  9.1*  --  8.4*  HCT 21.9*  --   --  31.2*  --  28.0*  PLT 258  --   --  311  --  311  APTT  --    < > 40* 114* 59* 105*  HEPARINUNFRC  --   --  >3.60* 3.46*  --   --   CREATININE 0.51  --   --   --   --  0.82  TROPONINI <0.03  --   --   --   --   --    < > = values in this interval not displayed.    Estimated Creatinine Clearance: 66.9 mL/min (by C-G formula based on SCr of 0.82 mg/dL).   Medical History: Past Medical History:  Diagnosis Date  . Anxiety   . Arthritis   . Cataract    right eye  . COPD (chronic obstructive pulmonary  disease) ([place])   . Depression   . Dyspnea    DOE  . Dysrhythmia   . Edema    FEET/ANKLES  . GERD (gastroesophageal reflux disease)   . H/O wheezing   . History of hiatal hernia   . HOH (hard of hearing)   . Hypertension   . Incontinence of urine in female   . Orthopnea   . Pain    CHRONIC KNEE  . Pain    CHRONIC KNEE  . Paranoid disorder ([place])   . RLS (restless legs syndrome)   . Tremors of nervous system    HANDS    Medications:  Medications Prior to Admission  Medication Sig Dispense Refill Last Dose  . acetaminophen (TYLENOL) 325 MG tablet Take 650 mg by mouth every 4 (four) hours as needed for mild pain or moderate pain.   prn at prn  . acidophilus (RISAQUAD) CAPS capsule Take 1 capsule by mouth 3 (three) times daily with meals. 60 capsule 0 07/01/2018 at Unknown time  . albuterol (PROVENTIL) (2.5 MG/3ML) 0.083% nebulizer solution Take 3 mLs (2.5 mg total) by nebulization 2 (two) times daily as needed for wheezing  or shortness of breath. (Patient taking differently: Take 2.5 mg by nebulization every 6 (six) hours as needed for shortness of breath. ) 75 mL 5 prn at prn  . amiodarone (PACERONE) 200 MG tablet Take 1 tablet (200 mg total) by mouth 2 (two) times daily. 60 tablet 0 07/01/2018 at 1000  . apixaban (ELIQUIS) 5 MG TABS tablet Take 1 tablet (5 mg total) by mouth 2 (two) times daily. 60 tablet 0 07/01/2018 at 1000  . aspirin EC 81 MG EC tablet Take 1 tablet (81 mg total) by mouth daily. 30 tablet 0 07/01/2018 at Unknown time  . azithromycin (ZITHROMAX) 500 MG tablet Take 500 mg by mouth daily. Give 1 tablet by mouth in the evening for ear infection for 10 days.     . [EXPIRED] busPIRone (BUSPAR) 10 MG tablet Take 15 mg by mouth 3 (three) times daily. Related to generalized anxiety disorder for 15 days, dose is 15mg      . carbamide peroxide (DEBROX) 6.5 % OTIC solution Place 5 drops into both ears daily. For clogged ears for 7 days     . Cranberry 250 MG CAPS Take 1 capsule by  mouth daily.    07/01/2018 at Unknown time  . cyanocobalamin 1000 MCG tablet Take 1,000 mcg by mouth daily at 6 PM.    07/01/2018 at Unknown time  . digoxin (LANOXIN) 0.125 MG tablet Take 0.5 tablets (0.0625 mg total) by mouth daily. 15 tablet 0 07/01/2018 at Unknown time  . divalproex (DEPAKOTE ER) 250 MG 24 hr tablet Take 250 mg by mouth 2 (two) times daily.   07/01/2018 at 1000  . fluticasone (FLONASE) 50 MCG/ACT nasal spray Place 1 spray into both nostrils daily.   07/01/2018 at Unknown time  . Fluticasone-Umeclidin-Vilant (TRELEGY ELLIPTA) 100-62.5-25 MCG/INH AEPB Inhale 1 puff into the lungs daily. 1 each 2 07/01/2018 at Unknown time  . furosemide (LASIX) 40 MG tablet Take 1 tablet (40 mg total) by mouth daily. 30 tablet 0   . furosemide (LASIX) 40 MG tablet Take 40 mg by mouth daily. Give 1 tablet by mouth in the evening related to unspecified systolic (congestive) heart failure for 3 days.     [name] Kitchen gabapentin (NEURONTIN) 100 MG capsule Take 3 capsules (300 mg total) by mouth 2 (two) times daily. 30 capsule 0 Not Taking at Unknown time  . loperamide (IMODIUM) 2 MG capsule Take 1 capsule (2 mg total) by mouth as needed for diarrhea or loose stools. (Patient taking differently: Take 2 mg by mouth daily as needed for diarrhea or loose stools. ) 15 capsule 0 prn at prn  . magnesium oxide (MAG-OX) 400 (241.3 Mg) MG tablet Take 1 tablet (400 mg total) by mouth 2 (two) times daily. 30 tablet 0 07/01/2018 at am  . meclizine (ANTIVERT) 12.5 MG tablet Take 12.5 mg by mouth 2 (two) times daily as needed for dizziness.   prn at prn  . Melatonin 10 MG CAPS Take 10 mg by mouth at bedtime.    06/30/2018 at Unknown time  . midodrine (PROAMATINE) 10 MG tablet Take 1 tablet (10 mg total) by mouth 3 (three) times daily with meals. 60 tablet 0 07/01/2018 at 1700  . montelukast (SINGULAIR) 10 MG tablet TAKE 1 TABLET(10 MG) BY MOUTH EVERY NIGHT (Patient taking differently: Take 10 mg by mouth at bedtime. ) 90 tablet 4 07/01/2018  at Unknown time  . omega-3 acid ethyl esters (LOVAZA) 1 g capsule Take 1 capsule (1 g total) by mouth  daily. 30 capsule 0 07/01/2018 at Unknown time  . risperiDONE (RISPERDAL) 1 MG tablet Take 1 mg by mouth at bedtime. Related to schizophrenia     . sertraline (ZOLOFT) 100 MG tablet Take 1 tablet (100 mg total) by mouth daily. 30 tablet 0   . theophylline (THEO-24) 200 MG 24 hr capsule Take 1 capsule (200 mg total) by mouth daily. 30 capsule 5 07/01/2018 at Unknown time  . vitamin C (VITAMIN C) 250 MG tablet Take 1 tablet (250 mg total) by mouth 2 (two) times daily. 30 tablet 0 Not Taking at Unknown time  . feeding supplement, ENSURE ENLIVE, (ENSURE ENLIVE) LIQD Take 237 mLs by mouth 2 (two) times daily between meals. (Patient not taking: Reported on 07/01/2018) 90 Bottle 0 Not Taking at Unknown time  . protein supplement shake (PREMIER PROTEIN) LIQD Take 325 mLs (11 oz total) by mouth 2 (two) times daily between meals. (Patient not taking: Reported on 07/01/2018) 30 Can 0 Not Taking at Unknown time    Assessment: Have drawn baseline APTT and INR, will start heparin drip @ 2200 (~ 12 hours after last apixiban dose)   Goal of Therapy:  Heparin level 0.3-0.7 units/ml aPTT 66-102 seconds Monitor platelets by anticoagulation protocol: Yes   Plan:   02/27 @ 0530 aPTT 105 seconds therapeutic. HL still pending. Will decrease rate to 1100 units/hr and will recheck aPTT @ 1500. CBC trending down will continue to monitor.  Tobie Lords, PharmD, BCPS Clinical Pharmacist 08/03/2018

## 2018-08-03 NOTE — Progress Notes (Signed)
CRITICAL CARE NOTE  CC  follow up respiratory failure  SUBJECTIVE Patient remains critically ill Prognosis is guarded On biPAP Obtunded +INFL A pneumonia  Patient is DNR/DNI    SIGNIFICANT EVENTS   REVIEW OF SYSTEMS  PATIENT IS UNABLE TO PROVIDE COMPLETE REVIEW OF SYSTEMS DUE TO SEVERE CRITICAL ILLNESS   PHYSICAL EXAMINATION:  GENERAL:critically ill appearing, +resp distress HEAD: Normocephalic, atraumatic.  EYES: Pupils equal, round, reactive to light.  No scleral icterus.  MOUTH: Moist mucosal membrane. NECK: Supple. No thyromegaly. No nodules. No JVD.  PULMONARY: +rhonchi, +wheezing CARDIOVASCULAR: S1 and S2. Regular rate and rhythm. No murmurs, rubs, or gallops.  GASTROINTESTINAL: Soft, nontender, -distended. No masses. Positive bowel sounds. No hepatosplenomegaly.  MUSCULOSKELETAL: No swelling, clubbing, or edema.  NEUROLOGIC: obtunded, GCS<8 SKIN:intact,warm,dry    CULTURE RESULTS   No results found for this or any previous visit (from the past 240 hour(s)).        IMAGING    Dg Chest Port 1 View  Result Date: 08/03/2018 CLINICAL DATA:  [age]-year-old female with acute respiratory distress EXAM: PORTABLE CHEST 1 VIEW COMPARISON:  . Chest radiograph dated 08/01/2018 FINDINGS: There is cardiomegaly with vascular congestion and probable mild interstitial edema. Small left pleural effusion with associated left lung base atelectasis. Pneumonia is not excluded. Clinical correlation is recommended. No pneumothorax. No acute osseous pathology. IMPRESSION: Cardiomegaly with findings of CHF and small left pleural effusion. Pneumonia is not excluded. Electronically Signed   By: Anner Crete M.D.   On: 08/03/2018 05:06      Indwelling Urinary Catheter continued, requirement due to   Reason to continue Indwelling Urinary Catheter for strict Intake/Output monitoring for hemodynamic instability      ASSESSMENT AND PLAN SYNOPSIS   Severe Hypoxic and  Hypercapnic Respiratory Failure COPD exacerbation from INFL A pneumonia -continue BiPAP -continue Bronchodilator Therapy -Wean Fio2   CARDIAC FAILURE-dCHF grade 1 -oxygen as needed -Lasix as tolerated -follow up cardiac enzymes as indicated Follow up ECHO   NEUROLOGY encephalopathy from resp failure     CARDIAC ICU monitoring  ID -continue IV abx as prescibed -follow up cultures  GI/Nutrition GI PROPHYLAXIS as indicated DIET-->TF's as tolerated Constipation protocol as indicated  ENDO - ICU hypoglycemic\Hyperglycemia protocol -check FSBS per protocol   ELECTROLYTES -follow labs as needed -replace as needed -pharmacy consultation and following   DVT/GI PRX ordered TRANSFUSIONS AS NEEDED MONITOR FSBS ASSESS the need for LABS as needed   Critical Care Time devoted to patient care services described in this note is 32 minutes.   Overall, patient is critically ill, prognosis is guarded.  Patient with high risk for cardiac arrest and death.    Corrin Parker, M.D.  [facility]  Medical Director [place] Director [facility] Cardio-Pulmonary Department

## 2018-08-03 NOTE — Progress Notes (Signed)
Md notiied of low BP and 1L bolus of NS ordered

## 2018-08-04 DIAGNOSIS — I509 Heart failure, unspecified: Secondary | ICD-10-CM

## 2018-08-04 DIAGNOSIS — Z515 Encounter for palliative care: Secondary | ICD-10-CM

## 2018-08-04 DIAGNOSIS — Z7189 Other specified counseling: Secondary | ICD-10-CM

## 2018-08-04 DIAGNOSIS — J09X2 Influenza due to identified novel influenza A virus with other respiratory manifestations: Secondary | ICD-10-CM

## 2018-08-04 LAB — CBC
HCT: 25.3 % — ABNORMAL LOW (ref 36.0–46.0)
Hemoglobin: 7.6 g/dL — ABNORMAL LOW (ref 12.0–15.0)
MCH: 27.3 pg (ref 26.0–34.0)
MCHC: 30 g/dL (ref 30.0–36.0)
MCV: 91 fL (ref 80.0–100.0)
Platelets: 281 10*3/uL (ref 150–400)
RBC: 2.78 MIL/uL — ABNORMAL LOW (ref 3.87–5.11)
RDW: 16.7 % — ABNORMAL HIGH (ref 11.5–15.5)
WBC: 6.4 10*3/uL (ref 4.0–10.5)
nRBC: 0.3 % — ABNORMAL HIGH (ref 0.0–0.2)

## 2018-08-04 LAB — TYPE AND SCREEN
ABO/RH(D): A NEG
Antibody Screen: POSITIVE
PT AG Type: POSITIVE
Unit division: 0
Unit division: 0

## 2018-08-04 LAB — BASIC METABOLIC PANEL
Anion gap: 9 (ref 5–15)
BUN: 39 mg/dL — ABNORMAL HIGH (ref 8–23)
CO2: 45 mmol/L — ABNORMAL HIGH (ref 22–32)
CREATININE: 0.77 mg/dL (ref 0.44–1.00)
Calcium: 8.1 mg/dL — ABNORMAL LOW (ref 8.9–10.3)
Chloride: 80 mmol/L — ABNORMAL LOW (ref 98–111)
GFR calc Af Amer: >60 mL/min (ref >60)
GFR calc non Af Amer: >60 mL/min (ref >60)
Glucose, Bld: 125 mg/dL — ABNORMAL HIGH (ref 70–99)
Potassium: 4 mmol/L (ref 3.5–5.1)
Sodium: 134 mmol/L — ABNORMAL LOW (ref 135–145)

## 2018-08-04 LAB — APTT: aPTT: 56 seconds — ABNORMAL HIGH (ref 24–36)

## 2018-08-04 LAB — BPAM RBC
Blood Product Expiration Date: 202003032359
Blood Product Expiration Date: 202003082359
UNIT TYPE AND RH: 600
Unit Type and Rh: 600

## 2018-08-04 LAB — HEPARIN LEVEL (UNFRACTIONATED): HEPARIN UNFRACTIONATED: 0.63 [IU]/mL (ref 0.30–0.70)

## 2018-08-04 MED ORDER — LORAZEPAM 2 MG/ML PO CONC
2.0000 mg | ORAL | Status: DC | PRN
  Filled 2018-08-04: qty 1

## 2018-08-04 MED ORDER — LORAZEPAM 2 MG/ML PO CONC: 2.0000 mg | ORAL | Status: DC | PRN

## 2018-08-04 MED ORDER — DIPHENHYDRAMINE HCL 50 MG/ML IJ SOLN: 12.5000 mg | INTRAMUSCULAR | Status: DC | PRN

## 2018-08-04 MED ORDER — OXYCODONE HCL 20 MG/ML PO CONC
5.0000 mg | ORAL | Status: DC
  Administered 2018-08-04: 5 mg via ORAL
  Filled 2018-08-04 (×2): qty 1

## 2018-08-04 MED ORDER — HALOPERIDOL LACTATE 5 MG/ML IJ SOLN: 0.5000 mg | INTRAMUSCULAR | Status: DC | PRN

## 2018-08-04 MED ORDER — HALOPERIDOL 0.5 MG PO TABS
0.5000 mg | ORAL_TABLET | ORAL | Status: DC | PRN
  Filled 2018-08-04: qty 1

## 2018-08-04 MED ORDER — HYDROMORPHONE HCL 2 MG PO TABS: 2.0000 mg | ORAL_TABLET | ORAL | 0 refills | Status: AC | PRN

## 2018-08-04 MED ORDER — HYDROMORPHONE HCL 1 MG/ML IJ SOLN: 1.0000 mg | INTRAMUSCULAR | Status: DC | PRN

## 2018-08-04 MED ORDER — ALPRAZOLAM 0.25 MG PO TABS
0.2500 mg | ORAL_TABLET | Freq: Three times a day (TID) | ORAL | Status: DC
  Administered 2018-08-04: 0.25 mg via ORAL
  Filled 2018-08-04: qty 1

## 2018-08-04 MED ORDER — LORAZEPAM 2 MG/ML IJ SOLN: 2.0000 mg | INTRAMUSCULAR | Status: DC | PRN

## 2018-08-04 MED ORDER — GLYCOPYRROLATE 0.2 MG/ML IJ SOLN
0.2000 mg | INTRAMUSCULAR | Status: DC | PRN
  Filled 2018-08-04: qty 1

## 2018-08-04 MED ORDER — HALOPERIDOL LACTATE 2 MG/ML PO CONC
0.5000 mg | ORAL | Status: DC | PRN
  Filled 2018-08-04: qty 0.3

## 2018-08-04 MED ORDER — LORAZEPAM 2 MG PO TABS: 2.0000 mg | ORAL_TABLET | ORAL | Status: DC | PRN

## 2018-08-04 MED ORDER — LORAZEPAM 2 MG PO TABS: 2.0000 mg | ORAL_TABLET | ORAL | 0 refills | Status: AC | PRN

## 2018-08-04 MED ORDER — TRAZODONE HCL 50 MG PO TABS: 50.0000 mg | ORAL_TABLET | Freq: Every day | ORAL | Status: DC

## 2018-08-04 MED ORDER — GLYCOPYRROLATE 1 MG PO TABS
1.0000 mg | ORAL_TABLET | ORAL | Status: DC | PRN
  Filled 2018-08-04: qty 1

## 2018-08-04 NOTE — Progress Notes (Signed)
Spoke with Shawna Clamp at hospice home.  Gave depakote and neurontin prior to discharge.  Ems here to pick up patient, no distress noted, family at bedside.

## 2018-08-04 NOTE — Discharge Summary (Signed)
[facility] [place] NAME: Vicki Morales    MR#:  [id number]  DATE OF BIRTH:  [birth year]  DATE OF ADMISSION:  08/01/2018 ADMITTING PHYSICIAN: Harrie Foreman, MD  DATE OF DISCHARGE: 08/04/2018   PRIMARY CARE PHYSICIAN: Birdie Sons, MD    ADMISSION DIAGNOSIS:  Hypoxia [R09.02] COPD exacerbation ([place]) [J44.1] Cellulitis of left upper extremity [L03.114] Anemia, unspecified type [D64.9] Acute on chronic congestive heart failure, unspecified heart failure type ([place]) [I50.9]  DISCHARGE DIAGNOSIS:  Active Problems:   Acute respiratory failure with hypoxemia (HCC)   Acute on chronic congestive heart failure ([place])   Influenza due to identified novel influenza A virus with other respiratory manifestations   Goals of care, counseling/discussion   Advanced care planning/counseling discussion   Palliative care by specialist   SECONDARY DIAGNOSIS:   Past Medical History:  Diagnosis Date  . Anxiety   . Arthritis   . Cataract    right eye  . COPD (chronic obstructive pulmonary disease) ([place])   . Depression   . Dyspnea    DOE  . Dysrhythmia   . Edema    FEET/ANKLES  . GERD (gastroesophageal reflux disease)   . H/O wheezing   . History of hiatal hernia   . HOH (hard of hearing)   . Hypertension   . Incontinence of urine in female   . Orthopnea   . Pain    CHRONIC KNEE  . Pain    CHRONIC KNEE  . Paranoid disorder ([place])   . RLS (restless legs syndrome)   . Tremors of nervous system    HANDS    HOSPITAL COURSE:  78 year old female with obesity, COPD and chronic hypoxic respiratory failure who presented with shortness of breath and increased work of breathing.  1.  Acute on chronic hypoxic respiratory failure due to influenza A with acute exacerbation COPD and CHF exacerbation:  Patient off of BiPAP and on nasal cannula  However given her respiratory instability and poor prognosis family decided on comfort care and patient  will be going to hospice home.   2.  Severe COPD exacerbation:   3.  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  PAF:    5.  Anxiety/depression:   6.  Chronic anemia:   7.  Acute encephalopathy in the setting of above issues:    8.  Acute on chronic anemia: Hemoglobin is 7.6    DISCHARGE CONDITIONS AND DIET:   guarded hospice home  CONSULTS OBTAINED:    DRUG ALLERGIES:   Allergies  Allergen Reactions  . Arthrotec [Diclofenac-Misoprostol] Hives  . Codeine Other (See Comments)    Headache   . Diclofenac Hives and Other (See Comments)    Reaction: unknown  . Hydrochlorothiazide Other (See Comments)    Weakness   . Penicillins Hives    Has patient had a PCN reaction causing immediate rash, facial/tongue/throat swelling, SOB or lightheadedness with hypotension: No Has patient had a PCN reaction causing severe rash involving mucus membranes or skin necrosis: No Has patient had a PCN reaction that required hospitalization: No Has patient had a PCN reaction occurring within the last 10 years: No If all of the above answers are "NO", then may proceed with Cephalosporin use.  . Sulfa Antibiotics Hives  . Tiotropium Bromide Monohydrate Other (See Comments)    Blurry vision   . Tylenol [Acetaminophen] Other (See Comments)    Doesn't work  . Morphine  Hives and Rash  . Pantoprazole Rash    DISCHARGE MEDICATIONS:   Allergies as of 08/04/2018      Reactions   Arthrotec [diclofenac-misoprostol] Hives   Codeine Other (See Comments)   Headache    Diclofenac Hives, Other (See Comments)   Reaction: unknown   Hydrochlorothiazide Other (See Comments)   Weakness    Penicillins Hives   Has patient had a PCN reaction causing immediate rash, facial/tongue/throat swelling, SOB or lightheadedness with hypotension: No Has patient had a PCN reaction causing severe rash involving mucus membranes or skin necrosis: No Has patient had a  PCN reaction that required hospitalization: No Has patient had a PCN reaction occurring within the last 10 years: No If all of the above answers are "NO", then may proceed with Cephalosporin use.   Sulfa Antibiotics Hives   Tiotropium Bromide Monohydrate Other (See Comments)   Blurry vision    Tylenol [acetaminophen] Other (See Comments)   Doesn't work   Morphine Hives, Rash   Pantoprazole Rash      Medication List    STOP taking these medications   acetaminophen 325 MG tablet Commonly known as:  TYLENOL   acidophilus Caps capsule   albuterol (2.5 MG/3ML) 0.083% nebulizer solution Commonly known as:  PROVENTIL   amiodarone 200 MG tablet Commonly known as:  PACERONE   apixaban 5 MG Tabs tablet Commonly known as:  ELIQUIS   ascorbic acid 250 MG tablet Commonly known as:  VITAMIN C   aspirin 81 MG EC tablet   azithromycin 500 MG tablet Commonly known as:  ZITHROMAX   busPIRone 10 MG tablet Commonly known as:  BUSPAR   carbamide peroxide 6.5 % OTIC solution Commonly known as:  DEBROX   Cranberry 250 MG Caps   cyanocobalamin 1000 MCG tablet   digoxin 0.125 MG tablet Commonly known as:  LANOXIN   divalproex 250 MG 24 hr tablet Commonly known as:  DEPAKOTE ER   feeding supplement (ENSURE ENLIVE) Liqd   fluticasone 50 MCG/ACT nasal spray Commonly known as:  FLONASE   Fluticasone-Umeclidin-Vilant 100-62.5-25 MCG/INH Aepb Commonly known as:  TRELEGY ELLIPTA   furosemide 40 MG tablet Commonly known as:  LASIX   gabapentin 100 MG capsule Commonly known as:  NEURONTIN   loperamide 2 MG capsule Commonly known as:  IMODIUM   magnesium oxide 400 (241.3 Mg) MG tablet Commonly known as:  MAG-OX   meclizine 12.5 MG tablet Commonly known as:  ANTIVERT   Melatonin 10 MG Caps   midodrine 10 MG tablet Commonly known as:  PROAMATINE   montelukast 10 MG tablet Commonly known as:  SINGULAIR   omega-3 acid ethyl esters 1 g capsule Commonly known as:  LOVAZA    protein supplement shake Liqd Commonly known as:  PREMIER PROTEIN   risperiDONE 1 MG tablet Commonly known as:  RISPERDAL   sertraline 100 MG tablet Commonly known as:  ZOLOFT   theophylline 200 MG 24 hr capsule Commonly known as:  THEO-24     TAKE these medications   HYDROmorphone 2 MG tablet Commonly known as:  DILAUDID Take 1 tablet (2 mg total) by mouth every 4 (four) hours as needed for moderate pain or severe pain.   LORazepam 2 MG tablet Commonly known as:  ATIVAN Take 1 tablet (2 mg total) by mouth every 4 (four) hours as needed for anxiety.         Today   CHIEF COMPLAINT:  Family at bedside after meeting with PC she is  comfort care   VITAL SIGNS:  Blood pressure (!) 97/51, pulse 72, temperature (!) 97.5 F (36.4 C), temperature source Oral, resp. rate 18, height 5\' 4"  (1.626 m), weight 105.9 kg, SpO2 98 %.   REVIEW OF SYSTEMS:  Review of Systems  Unable to perform ROS: Acuity of condition     PHYSICAL EXAMINATION:  GENERAL:  78 y.o.-year-old patient lying in the bed  Family at bedside Increased WOB NECK:  Supple, no jugular venous distention. No thyroid enlargement, no tenderness.  LUNGS: b/l rhonchii/ CARDIOVASCULAR: S1, S2 normal. No murmurs, rubs, or gallops.  ABDOMEN: Soft, non-tender, non-distended. Bowel sounds present. No organomegaly or mass.  EXTREMITIES: 1+ LEE, CONFUSED  DATA REVIEW:   CBC Recent Labs  Lab 08/04/18 0517  WBC 6.4  HGB 7.6*  HCT 25.3*  PLT 281    Chemistries  Recent Labs  Lab 08/01/18 0051  08/04/18 0517  NA 133*   < > 134*  K 3.7   < > 4.0  CL 82*   < > 80*  CO2 42*   < > 45*  GLUCOSE 102*   < > 125*  BUN 16   < > 39*  CREATININE 0.51   < > 0.77  CALCIUM 8.4*   < > 8.1*  AST 25  --   --   ALT 14  --   --   ALKPHOS 60  --   --   BILITOT 0.6  --   --    < > = values in this interval not displayed.    Cardiac Enzymes Recent Labs  Lab 08/01/18 0051  TROPONINI <0.03    Microbiology Results   @[email or web address] @  RADIOLOGY:  Dg Chest Port 1 View  Result Date: 08/03/2018 CLINICAL DATA:  [age]-year-old female with acute respiratory distress EXAM: PORTABLE CHEST 1 VIEW COMPARISON:  . Chest radiograph dated 08/01/2018 FINDINGS: There is cardiomegaly with vascular congestion and probable mild interstitial edema. Small left pleural effusion with associated left lung base atelectasis. Pneumonia is not excluded. Clinical correlation is recommended. No pneumothorax. No acute osseous pathology. IMPRESSION: Cardiomegaly with findings of CHF and small left pleural effusion. Pneumonia is not excluded. Electronically Signed   By: Anner Crete M.D.   On: 08/03/2018 05:06      Allergies as of 08/04/2018      Reactions   Arthrotec [diclofenac-misoprostol] Hives   Codeine Other (See Comments)   Headache    Diclofenac Hives, Other (See Comments)   Reaction: unknown   Hydrochlorothiazide Other (See Comments)   Weakness    Penicillins Hives   Has patient had a PCN reaction causing immediate rash, facial/tongue/throat swelling, SOB or lightheadedness with hypotension: No Has patient had a PCN reaction causing severe rash involving mucus membranes or skin necrosis: No Has patient had a PCN reaction that required hospitalization: No Has patient had a PCN reaction occurring within the last 10 years: No If all of the above answers are "NO", then may proceed with Cephalosporin use.   Sulfa Antibiotics Hives   Tiotropium Bromide Monohydrate Other (See Comments)   Blurry vision    Tylenol [acetaminophen] Other (See Comments)   Doesn't work   Morphine Hives, Rash   Pantoprazole Rash      Medication List    STOP taking these medications   acetaminophen 325 MG tablet Commonly known as:  TYLENOL   acidophilus Caps capsule   albuterol (2.5 MG/3ML) 0.083% nebulizer solution Commonly known as:  PROVENTIL   amiodarone 200 MG  tablet Commonly known as:  PACERONE   apixaban 5 MG Tabs  tablet Commonly known as:  ELIQUIS   ascorbic acid 250 MG tablet Commonly known as:  VITAMIN C   aspirin 81 MG EC tablet   azithromycin 500 MG tablet Commonly known as:  ZITHROMAX   busPIRone 10 MG tablet Commonly known as:  BUSPAR   carbamide peroxide 6.5 % OTIC solution Commonly known as:  DEBROX   Cranberry 250 MG Caps   cyanocobalamin 1000 MCG tablet   digoxin 0.125 MG tablet Commonly known as:  LANOXIN   divalproex 250 MG 24 hr tablet Commonly known as:  DEPAKOTE ER   feeding supplement (ENSURE ENLIVE) Liqd   fluticasone 50 MCG/ACT nasal spray Commonly known as:  FLONASE   Fluticasone-Umeclidin-Vilant 100-62.5-25 MCG/INH Aepb Commonly known as:  TRELEGY ELLIPTA   furosemide 40 MG tablet Commonly known as:  LASIX   gabapentin 100 MG capsule Commonly known as:  NEURONTIN   loperamide 2 MG capsule Commonly known as:  IMODIUM   magnesium oxide 400 (241.3 Mg) MG tablet Commonly known as:  MAG-OX   meclizine 12.5 MG tablet Commonly known as:  ANTIVERT   Melatonin 10 MG Caps   midodrine 10 MG tablet Commonly known as:  PROAMATINE   montelukast 10 MG tablet Commonly known as:  SINGULAIR   omega-3 acid ethyl esters 1 g capsule Commonly known as:  LOVAZA   protein supplement shake Liqd Commonly known as:  PREMIER PROTEIN   risperiDONE 1 MG tablet Commonly known as:  RISPERDAL   sertraline 100 MG tablet Commonly known as:  ZOLOFT   theophylline 200 MG 24 hr capsule Commonly known as:  THEO-24     TAKE these medications   HYDROmorphone 2 MG tablet Commonly known as:  DILAUDID Take 1 tablet (2 mg total) by mouth every 4 (four) hours as needed for moderate pain or severe pain.   LORazepam 2 MG tablet Commonly known as:  ATIVAN Take 1 tablet (2 mg total) by mouth every 4 (four) hours as needed for anxiety.          Management plans discussed with the patient's family and they are in agreement. Stable for discharge hospice  Patient  should follow up with hospice  CODE STATUS:     Code Status Orders  (From admission, onward)         Start     Ordered   08/04/18 1350  Do not attempt resuscitation (DNR)  Continuous    Question Answer Comment  In the event of cardiac or respiratory ARREST Do not call a "code blue"   In the event of cardiac or respiratory ARREST Do not perform Intubation, CPR, defibrillation or ACLS   In the event of cardiac or respiratory ARREST Use medication by any route, position, wound care, and other measures to relive pain and suffering. May use oxygen, suction and manual treatment of airway obstruction as needed for comfort.   Comments nurse may pronounce      08/04/18 1353        Code Status History    Date Active Date Inactive Code Status Order ID Comments User Context   08/01/2018 0835 08/04/2018 1353 DNR [id number]  Harrie Foreman, MD ED   07/02/2018 0225 07/07/2018 1851 DNR [id number]  Lance Coon, MD Inpatient   06/09/2018 1820 06/23/2018 2057 DNR [id number]  Saundra Shelling, MD ED   03/05/2018 1338 03/20/2018 1812 DNR [id number]  Epifanio Lesches, MD ED   02/19/2018 1709  02/26/2018 1634 DNR [id number]  Loletha Grayer, MD ED   12/03/2017 1258 12/06/2017 1847 DNR [id number]  Gladstone Lighter, MD Inpatient   10/24/2017 1844 10/26/2017 1623 DNR [id number]  Vaughan Basta, MD Inpatient   09/23/2015 0018 09/23/2015 1823 DNR [id number]  Lance Coon, MD Inpatient    Advance Directive Documentation     Most Recent Value  Type of Advance Directive  Out of facility DNR (pink MOST or yellow form)  Pre-existing out of facility DNR order (yellow form or pink MOST form)  Yellow form placed in chart (order not valid for inpatient use)  "MOST" Form in Place?  -      TOTAL TIME TAKING CARE OF THIS PATIENT: 38 minutes.    Note: This dictation was prepared with Dragon dictation along with smaller phrase technology. Any transcriptional errors that result from this process are  unintentional.  Tagg Eustice M.D on 08/04/2018 at 4:23 PM  Between 7am to 6pm - Pager - [phone] After 6pm go to [email or web address] - password [email or web address] [facility]  Office  [phone]  CC: Primary care physician; Birdie Sons, MD

## 2018-08-04 NOTE — Consult Note (Signed)
Consultation Note Date: 08/04/2018   Patient Name: Vicki Morales  DOB: [birth year]  MRN: [id number]  Age / Sex: 78 y.o., female  PCP: Birdie Sons, MD Referring Physician: Bettey Costa, MD  Reason for Consultation: Establishing goals of care  HPI/Patient Profile: 78 y.o. female  with past medical history of end stage COPD (s/p frequent admissions for respiratory failure r/t pneumonia in the last few months requiring bipap), schizophrenia, anxiety, CHF, a-fib, chronic hyponatremia, dysphagia, admitted on 08/01/2018 from [facility] with respiratory distress and altered mental status. Workup revealed COPD and CHF exacerbation in the setting of pneumonia, +influnza A. She required bipap for four days. Was encephalopathic, required fluid resuscitation for low BP and IV lasix for diuresis. Palliative medicine consulted for [place].     Clinical Assessment and Goals of Care: I met at the bedside with patient, her sonElta Guadeloupe, and daughterBarnetta Chapel.   Patient was awake and alert, oriented and able to participate in [place] discussion.   Prior to this admission- patient was living at [facility]- she has been unable to ambulate for several months. She has had a decrease in her desire to eat.   We discussed patient's overall illness trajectory and EOL wishes. She states she has much anxiety and her GOC is to die comfortably and not be anxious or in pain. Her children express that she has schizophrenia and when she gets SOB her hallucinations are exacerbated due to her anxiety.   Patient expressed that she does not wish to continue aggressive medical care. She is tired of coming in the hospital. She states she is ready to die and die comfortably. Patient and her children discuss how patient's husband made a decision to stop dialysis and die comfortably and patient says she is ready to do the same and go be with her  husband.   We discussed comfort measures only- including getting medications only for anxiety, SOB, pain- will continue her psychiatric medications as long as she is able to swallow them.   We discussed Hospice- family would like to request residential Hospice placement and I agree this is appropriate.   Primary Decision Maker PATIENT - with support of her daughter and son    SUMMARY OF RECOMMENDATIONS -Patient with severe end stage COPD, influenza +, recurrent pneumonia- likely aspirating -Transition to comfort measures only -Comfort orders entered -Cardiac and maintenance medications d/c'd -Referral made for residential hospice placement -Please note- patient becomes very anxious with SOB- please treat with benzodiazapine in addition to opioid as well as speak to patient in reassuring voice- patient's children say that speaking to her of God and "reminding her things are in God's hands" is also helpful in easing her anxiety and hallucinations - her hallucinations can be very frightening to her    Code Status/Advance Care Planning:  DNR  Palliative Prophylaxis:   Frequent Pain Assessment  Additional Recommendations (Limitations, Scope, Preferences):  Full Comfort Care  Prognosis:    < 2 weeks due to severe COPD, pneumonia, influenza +, transition to  full comfort measures only, very low functional status prior to this admission  Discharge Planning: Hospice facility  Primary Diagnoses: Present on Admission: . Acute respiratory failure with hypoxemia ([place])   I have reviewed the medical record, interviewed the patient and family, and examined the patient. The following aspects are pertinent.  Past Medical History:  Diagnosis Date  . Anxiety   . Arthritis   . Cataract    right eye  . COPD (chronic obstructive pulmonary disease) ([place])   . Depression   . Dyspnea    DOE  . Dysrhythmia   . Edema    FEET/ANKLES  . GERD (gastroesophageal reflux disease)   . H/O wheezing    . History of hiatal hernia   . HOH (hard of hearing)   . Hypertension   . Incontinence of urine in female   . Orthopnea   . Pain    CHRONIC KNEE  . Pain    CHRONIC KNEE  . Paranoid disorder ([place])   . RLS (restless legs syndrome)   . Tremors of nervous system    HANDS   Social History   Socioeconomic History  . Marital status: Widowed    Spouse name: Not on file  . Number of children: 2  . Years of education: Not on file  . Highest education level: 12th grade  Occupational History  . 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 Financial resource strain: Not hard at all  . Food insecurity:    Worry: Never true    Inability: Never true  . Transportation needs:    Medical: No    Non-medical: No  Tobacco Use  . Smoking status: Former Smoker    Years: 30.00  . Smokeless tobacco: Never Used  . Tobacco comment: quit in 1989  Substance and Sexual Activity  . Alcohol use: No    Alcohol/week: 0.0 standard drinks  . Drug use: No  . Sexual activity: Not Currently  Lifestyle  . Physical activity:    Days per week: 0 days    Minutes per session: 0 min  . Stress: Only a little  Relationships  . Social connections:    Talks on phone: Not on file    Gets together: Not on file    Attends religious service: Not on file    Active member of club or organization: Not on file    Attends meetings of clubs or organizations: Not on file    Relationship status: Not on file  Other Topics Concern  . Not on file  Social History Narrative   Lives at home with her daughter.  Ambulates with a walker at baseline   Family History  Problem Relation Age of Onset  . Breast cancer Sister   . Stroke Father   . Emphysema Father   . Anxiety disorder Father   . Depression Father   . Anxiety disorder Brother    Scheduled Meds: . albuterol  2.5 mg Nebulization Q4H  . ALPRAZolam  0.25 mg Oral TID  . aspirin EC  81 mg Oral Daily  . budesonide (PULMICORT) nebulizer solution  0.5 mg  Nebulization BID  . busPIRone  15 mg Oral TID  . carbamide peroxide  5 drop Both EARS Daily  . chlorhexidine  15 mL Mouth Rinse BID  . divalproex  250 mg Oral BID  . docusate sodium  100 mg Oral BID  . feeding supplement (ENSURE ENLIVE)  237 mL Oral BID BM  . fluticasone  1 spray Each Nare Daily  . furosemide  20 mg Intravenous BID  . gabapentin  300 mg Oral BID  . mouth rinse  15 mL Mouth Rinse q12n4p  . Melatonin  10 mg Oral QHS  . risperiDONE  1 mg Oral QHS  . sertraline  100 mg Oral Daily  . traZODone  50 mg Oral QHS   Continuous Infusions: PRN Meds:.meclizine Medications Prior to Admission:  Prior to Admission medications   Medication Sig Start Date End Date Taking? Authorizing Provider  acetaminophen (TYLENOL) 325 MG tablet Take 650 mg by mouth every 4 (four) hours as needed for mild pain or moderate pain.   Yes [provider]  acidophilus (RISAQUAD) CAPS capsule Take 1 capsule by mouth 3 (three) times daily with meals. 02/24/18  Yes Salary, Montell D, MD  albuterol (PROVENTIL) (2.5 MG/3ML) 0.083% nebulizer solution Take 3 mLs (2.5 mg total) by nebulization 2 (two) times daily as needed for wheezing or shortness of breath. Patient taking differently: Take 2.5 mg by nebulization every 6 (six) hours as needed for shortness of breath.  09/22/16  Yes Birdie Sons, MD  amiodarone (PACERONE) 200 MG tablet Take 1 tablet (200 mg total) by mouth 2 (two) times daily. 02/24/18  Yes Salary, Avel Peace, MD  apixaban (ELIQUIS) 5 MG TABS tablet Take 1 tablet (5 mg total) by mouth 2 (two) times daily. 02/24/18  Yes Salary, Avel Peace, MD  aspirin EC 81 MG EC tablet Take 1 tablet (81 mg total) by mouth daily. 02/25/18  Yes Salary, Avel Peace, MD  azithromycin (ZITHROMAX) 500 MG tablet Take 500 mg by mouth daily. Give 1 tablet by mouth in the evening for ear infection for 10 days. 07/31/18 08/10/18 Yes [provider]  carbamide peroxide (DEBROX) 6.5 % OTIC solution Place 5 drops into  both ears daily. For clogged ears for 7 days 07/28/18 08/04/18 Yes [provider]  Cranberry 250 MG CAPS Take 1 capsule by mouth daily.    Yes [provider]  cyanocobalamin 1000 MCG tablet Take 1,000 mcg by mouth daily at 6 PM.    Yes [provider]  digoxin (LANOXIN) 0.125 MG tablet Take 0.5 tablets (0.0625 mg total) by mouth daily. 02/24/18  Yes Salary, Avel Peace, MD  divalproex (DEPAKOTE ER) 250 MG 24 hr tablet Take 250 mg by mouth 2 (two) times daily.   Yes [provider]  fluticasone (FLONASE) 50 MCG/ACT nasal spray Place 1 spray into both nostrils daily.   Yes [provider]  Fluticasone-Umeclidin-Vilant (TRELEGY ELLIPTA) 100-62.5-25 MCG/INH AEPB Inhale 1 puff into the lungs daily. 11/08/17  Yes Birdie Sons, MD  furosemide (LASIX) 40 MG tablet Take 1 tablet (40 mg total) by mouth daily. 07/07/18  Yes Epifanio Lesches, MD  furosemide (LASIX) 40 MG tablet Take 40 mg by mouth daily. Give 1 tablet by mouth in the evening related to unspecified systolic (congestive) heart failure for 3 days. 07/30/18 08/02/18 Yes [provider]  gabapentin (NEURONTIN) 100 MG capsule Take 3 capsules (300 mg total) by mouth 2 (two) times daily. 06/23/18  Yes Pyreddy, Reatha Harps, MD  loperamide (IMODIUM) 2 MG capsule Take 1 capsule (2 mg total) by mouth as needed for diarrhea or loose stools. Patient taking differently: Take 2 mg by mouth daily as needed for diarrhea or loose stools.  02/24/18  Yes Salary, Avel Peace, MD  magnesium oxide (MAG-OX) 400 (241.3 Mg) MG tablet Take 1 tablet (400 mg total) by mouth 2 (two)  times daily. 03/20/18  Yes Epifanio Lesches, MD  meclizine (ANTIVERT) 12.5 MG tablet Take 12.5 mg by mouth 2 (two) times daily as needed for dizziness.   Yes [provider]  Melatonin 10 MG CAPS Take 10 mg by mouth at bedtime.    Yes [provider]  midodrine (PROAMATINE) 10 MG tablet Take 1 tablet (10 mg total) by mouth 3 (three)  times daily with meals. 03/20/18  Yes Epifanio Lesches, MD  montelukast (SINGULAIR) 10 MG tablet TAKE 1 TABLET(10 MG) BY MOUTH EVERY NIGHT Patient taking differently: Take 10 mg by mouth at bedtime.  01/29/18  Yes Birdie Sons, MD  omega-3 acid ethyl esters (LOVAZA) 1 g capsule Take 1 capsule (1 g total) by mouth daily. 03/20/18  Yes Epifanio Lesches, MD  risperiDONE (RISPERDAL) 1 MG tablet Take 1 mg by mouth at bedtime. Related to schizophrenia 07/07/18  Yes [provider]  sertraline (ZOLOFT) 100 MG tablet Take 1 tablet (100 mg total) by mouth daily. 07/07/18  Yes Epifanio Lesches, MD  theophylline (THEO-24) 200 MG 24 hr capsule Take 1 capsule (200 mg total) by mouth daily. 12/07/17  Yes Birdie Sons, MD  vitamin C (VITAMIN C) 250 MG tablet Take 1 tablet (250 mg total) by mouth 2 (two) times daily. 03/20/18  Yes Epifanio Lesches, MD  feeding supplement, ENSURE ENLIVE, (ENSURE ENLIVE) LIQD Take 237 mLs by mouth 2 (two) times daily between meals. Patient not taking: Reported on 07/01/2018 02/24/18   Salary, Holly Bodily D, MD  protein supplement shake (PREMIER PROTEIN) LIQD Take 325 mLs (11 oz total) by mouth 2 (two) times daily between meals. Patient not taking: Reported on 07/01/2018 03/20/18   Epifanio Lesches, MD   Allergies  Allergen Reactions  . Arthrotec [Diclofenac-Misoprostol] Hives  . Codeine Other (See Comments)    Headache   . Diclofenac Hives and Other (See Comments)    Reaction: unknown  . Hydrochlorothiazide Other (See Comments)    Weakness   . Penicillins Hives    Has patient had a PCN reaction causing immediate rash, facial/tongue/throat swelling, SOB or lightheadedness with hypotension: No Has patient had a PCN reaction causing severe rash involving mucus membranes or skin necrosis: No Has patient had a PCN reaction that required hospitalization: No Has patient had a PCN reaction occurring within the last 10 years: No If all of the above answers  are "NO", then may proceed with Cephalosporin use.  . Sulfa Antibiotics Hives  . Tiotropium Bromide Monohydrate Other (See Comments)    Blurry vision   . Tylenol [Acetaminophen] Other (See Comments)    Doesn't work  . Morphine Hives and Rash  . Pantoprazole Rash   Review of Systems  Constitutional: Positive for activity change, appetite change and fatigue.  Respiratory: Positive for cough and shortness of breath.   Psychiatric/Behavioral: Positive for hallucinations and sleep disturbance.    Physical Exam Vitals signs and nursing note reviewed.  Cardiovascular:     Rate and Rhythm: Normal rate.  Pulmonary:     Breath sounds: Rales present.  Neurological:     Mental Status: She is alert and oriented to person, place, and time.  Psychiatric:        Mood and Affect: Mood normal.        Behavior: Behavior normal.        Thought Content: Thought content normal.     Vital Signs: BP (!) 100/46   Pulse 62   Temp 98.7 F (37.1 C) (Axillary)  Resp 16   Ht _0  (1.626 m)   Wt 105.9 kg   SpO2 99%   BMI 40.07 kg/m  Pain Scale: 0-10   Pain Score: 0-No pain   SpO2: SpO2: 99 % O2 Device:SpO2: 99 % O2 Flow Rate: .O2 Flow Rate (L/min): 12 L/min  IO: Intake/output summary:   Intake/Output Summary (Last 24 hours) at 08/04/2018 1348 Last data filed at 08/04/2018 0900 Gross per 24 hour  Intake 698.08 ml  Output 700 ml  Net -1.92 ml    LBM: Last BM Date: 08/03/18 Baseline Weight: Weight: 109.5 kg Most recent weight: Weight: 105.9 kg     Palliative Assessment/Data: PPS: 20%   Flowsheet Rows     Most Recent Value  Intake Tab  Referral Department  Critical care  Unit at Time of Referral  ICU  Palliative Care Primary Diagnosis  Pulmonary  Date Notified  08/03/18  Palliative Care Type  Return patient Palliative Care  Reason for referral  End of Life Care Assistance  Date of Admission  08/01/18  # of days IP prior to Palliative referral  2  Clinical Assessment    Psychosocial & Spiritual Assessment  Palliative Care Outcomes      Thank you for this consult. Palliative medicine will continue to follow and assist as needed.   Time In: 1230 Time Out: 1420 Time Total: 110 mins Greater than 50%  of this time was spent counseling and coordinating care related to the above assessment and plan.  Signed by: Mariana Kaufman, AGNP-C Palliative Medicine    Please contact Palliative Medicine Team phone at [phone] for questions and concerns.  For individual provider: See Shea Evans

## 2018-08-04 NOTE — Progress Notes (Signed)
ANTICOAGULATION CONSULT NOTE - Initial Consult  Pharmacy Consult for Heparin  Indication: atrial fibrillation  Allergies  Allergen Reactions  . Arthrotec [Diclofenac-Misoprostol] Hives  . Codeine Other (See Comments)    Headache   . Diclofenac Hives and Other (See Comments)    Reaction: unknown  . Hydrochlorothiazide Other (See Comments)    Weakness   . Penicillins Hives    Has patient had a PCN reaction causing immediate rash, facial/tongue/throat swelling, SOB or lightheadedness with hypotension: No Has patient had a PCN reaction causing severe rash involving mucus membranes or skin necrosis: No Has patient had a PCN reaction that required hospitalization: No Has patient had a PCN reaction occurring within the last 10 years: No If all of the above answers are "NO", then may proceed with Cephalosporin use.  . Sulfa Antibiotics Hives  . Tiotropium Bromide Monohydrate Other (See Comments)    Blurry vision   . Tylenol [Acetaminophen] Other (See Comments)    Doesn't work  . Morphine Hives and Rash  . Pantoprazole Rash    Patient Measurements: Weight: 233 lb 7.5 oz (105.9 kg) Heparin Dosing Weight: 82.7 kg   Vital Signs: Temp: 98.7 F (37.1 C) (02/28 0400) Temp Source: Axillary (02/28 0400) BP: 91/49 (02/28 0500) Pulse Rate: 72 (02/28 0500)  Labs: Recent Labs    08/02/18 0550  08/03/18 0532 08/03/18 1502 08/03/18 2316 08/04/18 0517  HGB 9.1*  --  8.4*  --   --  7.6*  HCT 31.2*  --  28.0*  --   --  25.3*  PLT 311  --  311  --   --  281  APTT 114*   < > 105* 72* 79* 56*  HEPARINUNFRC 3.46*  --  1.61*  --   --  0.63  CREATININE  --   --  0.82  --   --  0.77   < > = values in this interval not displayed.    Estimated Creatinine Clearance: 69 mL/min (by C-G formula based on SCr of 0.77 mg/dL).   Medical History: Past Medical History:  Diagnosis Date  . Anxiety   . Arthritis   . Cataract    right eye  . COPD (chronic obstructive pulmonary disease) ([place])   .  Depression   . Dyspnea    DOE  . Dysrhythmia   . Edema    FEET/ANKLES  . GERD (gastroesophageal reflux disease)   . H/O wheezing   . History of hiatal hernia   . HOH (hard of hearing)   . Hypertension   . Incontinence of urine in female   . Orthopnea   . Pain    CHRONIC KNEE  . Pain    CHRONIC KNEE  . Paranoid disorder ([place])   . RLS (restless legs syndrome)   . Tremors of nervous system    HANDS    Medications:  Medications Prior to Admission  Medication Sig Dispense Refill Last Dose  . acetaminophen (TYLENOL) 325 MG tablet Take 650 mg by mouth every 4 (four) hours as needed for mild pain or moderate pain.   prn at prn  . acidophilus (RISAQUAD) CAPS capsule Take 1 capsule by mouth 3 (three) times daily with meals. 60 capsule 0 07/01/2018 at Unknown time  . albuterol (PROVENTIL) (2.5 MG/3ML) 0.083% nebulizer solution Take 3 mLs (2.5 mg total) by nebulization 2 (two) times daily as needed for wheezing or shortness of breath. (Patient taking differently: Take 2.5 mg by nebulization every 6 (six) hours as needed  for shortness of breath. ) 75 mL 5 prn at prn  . amiodarone (PACERONE) 200 MG tablet Take 1 tablet (200 mg total) by mouth 2 (two) times daily. 60 tablet 0 07/01/2018 at 1000  . apixaban (ELIQUIS) 5 MG TABS tablet Take 1 tablet (5 mg total) by mouth 2 (two) times daily. 60 tablet 0 07/01/2018 at 1000  . aspirin EC 81 MG EC tablet Take 1 tablet (81 mg total) by mouth daily. 30 tablet 0 07/01/2018 at Unknown time  . azithromycin (ZITHROMAX) 500 MG tablet Take 500 mg by mouth daily. Give 1 tablet by mouth in the evening for ear infection for 10 days.     . [EXPIRED] busPIRone (BUSPAR) 10 MG tablet Take 15 mg by mouth 3 (three) times daily. Related to generalized anxiety disorder for 15 days, dose is 15mg      . carbamide peroxide (DEBROX) 6.5 % OTIC solution Place 5 drops into both ears daily. For clogged ears for 7 days     . Cranberry 250 MG CAPS Take 1 capsule by mouth daily.     07/01/2018 at Unknown time  . cyanocobalamin 1000 MCG tablet Take 1,000 mcg by mouth daily at 6 PM.    07/01/2018 at Unknown time  . digoxin (LANOXIN) 0.125 MG tablet Take 0.5 tablets (0.0625 mg total) by mouth daily. 15 tablet 0 07/01/2018 at Unknown time  . divalproex (DEPAKOTE ER) 250 MG 24 hr tablet Take 250 mg by mouth 2 (two) times daily.   07/01/2018 at 1000  . fluticasone (FLONASE) 50 MCG/ACT nasal spray Place 1 spray into both nostrils daily.   07/01/2018 at Unknown time  . Fluticasone-Umeclidin-Vilant (TRELEGY ELLIPTA) 100-62.5-25 MCG/INH AEPB Inhale 1 puff into the lungs daily. 1 each 2 07/01/2018 at Unknown time  . furosemide (LASIX) 40 MG tablet Take 1 tablet (40 mg total) by mouth daily. 30 tablet 0   . furosemide (LASIX) 40 MG tablet Take 40 mg by mouth daily. Give 1 tablet by mouth in the evening related to unspecified systolic (congestive) heart failure for 3 days.     [name] Kitchen gabapentin (NEURONTIN) 100 MG capsule Take 3 capsules (300 mg total) by mouth 2 (two) times daily. 30 capsule 0 Not Taking at Unknown time  . loperamide (IMODIUM) 2 MG capsule Take 1 capsule (2 mg total) by mouth as needed for diarrhea or loose stools. (Patient taking differently: Take 2 mg by mouth daily as needed for diarrhea or loose stools. ) 15 capsule 0 prn at prn  . magnesium oxide (MAG-OX) 400 (241.3 Mg) MG tablet Take 1 tablet (400 mg total) by mouth 2 (two) times daily. 30 tablet 0 07/01/2018 at am  . meclizine (ANTIVERT) 12.5 MG tablet Take 12.5 mg by mouth 2 (two) times daily as needed for dizziness.   prn at prn  . Melatonin 10 MG CAPS Take 10 mg by mouth at bedtime.    06/30/2018 at Unknown time  . midodrine (PROAMATINE) 10 MG tablet Take 1 tablet (10 mg total) by mouth 3 (three) times daily with meals. 60 tablet 0 07/01/2018 at 1700  . montelukast (SINGULAIR) 10 MG tablet TAKE 1 TABLET(10 MG) BY MOUTH EVERY NIGHT (Patient taking differently: Take 10 mg by mouth at bedtime. ) 90 tablet 4 07/01/2018 at Unknown time   . omega-3 acid ethyl esters (LOVAZA) 1 g capsule Take 1 capsule (1 g total) by mouth daily. 30 capsule 0 07/01/2018 at Unknown time  . risperiDONE (RISPERDAL) 1 MG tablet Take 1 mg  by mouth at bedtime. Related to schizophrenia     . sertraline (ZOLOFT) 100 MG tablet Take 1 tablet (100 mg total) by mouth daily. 30 tablet 0   . theophylline (THEO-24) 200 MG 24 hr capsule Take 1 capsule (200 mg total) by mouth daily. 30 capsule 5 07/01/2018 at Unknown time  . vitamin C (VITAMIN C) 250 MG tablet Take 1 tablet (250 mg total) by mouth 2 (two) times daily. 30 tablet 0 Not Taking at Unknown time  . feeding supplement, ENSURE ENLIVE, (ENSURE ENLIVE) LIQD Take 237 mLs by mouth 2 (two) times daily between meals. (Patient not taking: Reported on 07/01/2018) 90 Bottle 0 Not Taking at Unknown time  . protein supplement shake (PREMIER PROTEIN) LIQD Take 325 mLs (11 oz total) by mouth 2 (two) times daily between meals. (Patient not taking: Reported on 07/01/2018) 30 Can 0 Not Taking at Unknown time    Assessment: Have drawn baseline APTT and INR, will start heparin drip @ 2200 (~ 12 hours after last apixiban dose). Patient remains on BiPAP.   Goal of Therapy:  Heparin level 0.3-0.7 units/ml aPTT 66-102 seconds Monitor platelets by anticoagulation protocol: Yes   Plan:   02/28 @ 0500 aPTT 56 seconds; HL 0.63. HL therapeutic, but aPTT subtherapeutic and not correlating; however, drip was running at 1050 units/hr for two hours from 1447 til 1717, unsure why. Also patient's Hgb has been steadily dropping and has dropped to 7.6 today and patient has been hypotensive; I suspect patient may be bleeding therefore will not increase rate and will continue at 1100 units/hr and will recheck aPTT/HL @ 1300 to see if levels can correlate.  Tobie Lords, PharmD, BCPS Clinical Pharmacist 08/04/2018

## 2018-08-04 NOTE — Plan of Care (Signed)

## 2018-08-04 NOTE — Progress Notes (Signed)
[facility] NAME: Vicki Morales    MR#:  [id number]  DATE OF BIRTH:  Nov 30, 1940  SUBJECTIVE:   Patient off of BiPAP.  Family at bedside.  Patient still a bit confused.   REVIEW OF SYSTEMS:  Unable to obtain patient appears to be confused    Tolerating Diet: yes      DRUG ALLERGIES:   Allergies  Allergen Reactions  . Arthrotec [Diclofenac-Misoprostol] Hives  . Codeine Other (See Comments)    Headache   . Diclofenac Hives and Other (See Comments)    Reaction: unknown  . Hydrochlorothiazide Other (See Comments)    Weakness   . Penicillins Hives    Has patient had a PCN reaction causing immediate rash, facial/tongue/throat swelling, SOB or lightheadedness with hypotension: No Has patient had a PCN reaction causing severe rash involving mucus membranes or skin necrosis: No Has patient had a PCN reaction that required hospitalization: No Has patient had a PCN reaction occurring within the last 10 years: No If all of the above answers are "NO", then may proceed with Cephalosporin use.  . Sulfa Antibiotics Hives  . Tiotropium Bromide Monohydrate Other (See Comments)    Blurry vision   . Tylenol [Acetaminophen] Other (See Comments)    Doesn't work  . Morphine Hives and Rash  . Pantoprazole Rash    VITALS:  Blood pressure (!) 100/46, pulse 62, temperature 98.7 F (37.1 C), temperature source Axillary, resp. rate 16, height 5\' 4"  (1.626 m), weight 105.9 kg, SpO2 99 %.  PHYSICAL EXAMINATION:  Constitutional: Appears obese no distress.HENT: Normocephalic. . On bipap Eyes: Conjunctivae are normal. PERRLA, no scleral icterus.  Neck: Normal ROM. Neck supple. No JVD. No tracheal deviation. CVS: RRR, S1/S2 +, no murmurs, no gallops, no carotid bruit.  Pulmonary: Respiratory effort with bilateral rhonchi/coarse breath sounds abdominal: Soft. BS +,  no distension, tenderness, rebound or guarding.  Musculoskeletal: Normal range of  motion.  1+ lower extremity edema and no tenderness.  Neuro: No focal deficits. Skin: Skin is warm and dry. No rash noted. Psychiatric: Confused    LABORATORY PANEL:   CBC Recent Labs  Lab 08/04/18 0517  WBC 6.4  HGB 7.6*  HCT 25.3*  PLT 281   ------------------------------------------------------------------------------------------------------------------  Chemistries  Recent Labs  Lab 08/01/18 0051  08/04/18 0517  NA 133*   < > 134*  K 3.7   < > 4.0  CL 82*   < > 80*  CO2 42*   < > 45*  GLUCOSE 102*   < > 125*  BUN 16   < > 39*  CREATININE 0.51   < > 0.77  CALCIUM 8.4*   < > 8.1*  AST 25  --   --   ALT 14  --   --   ALKPHOS 60  --   --   BILITOT 0.6  --   --    < > = values in this interval not displayed.   ------------------------------------------------------------------------------------------------------------------  Cardiac Enzymes Recent Labs  Lab 08/01/18 0051  TROPONINI <0.03   ------------------------------------------------------------------------------------------------------------------  RADIOLOGY:  Dg Chest Port 1 View  Result Date: 08/03/2018 CLINICAL DATA:  [age]-year-old female with acute respiratory distress EXAM: PORTABLE CHEST 1 VIEW COMPARISON:  . Chest radiograph dated 08/01/2018 FINDINGS: There is cardiomegaly with vascular congestion and probable mild interstitial edema. Small left pleural effusion with associated left lung base atelectasis. Pneumonia is not excluded. Clinical correlation is recommended. No pneumothorax. No acute  osseous pathology. IMPRESSION: Cardiomegaly with findings of CHF and small left pleural effusion. Pneumonia is not excluded. Electronically Signed   By: Anner Crete M.D.   On: 08/03/2018 05:06     ASSESSMENT AND PLAN:   78 year old female with obesity, COPD and chronic hypoxic respiratory failure who presented with shortness of breath and increased work of breathing.  1.  Acute on chronic hypoxic  respiratory failure due to influenza A with acute exacerbation COPD and CHF exacerbation:  Patient off of BiPAP and on nasal cannula  continue IV steroids 40 mg every 12 hours and IV LAsix Continue Tamiflu and treat for total 5 days   2.  Severe COPD exacerbation: Continue IV steroids, nebs and inhalers Continue theophylline Continue azithromycin for 2 more days.   3.  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  PAF: Currently on heparin gtt ( on eliquis at home) with plans to discontinue due to anemia Continue digoxin and amiodarone    5.  Anxiety/depression: Continue BuSpar, Depakote, Zoloft, Risperdal  6.  Chronic anemia: Hemoglobin relatively stable  7.  Acute encephalopathy in the setting of above issues: Patient does have confusion at baseline and appears probably near baseline 8.  Acute on chronic anemia: Hemoglobin is 7.6  DC heparin If CBC is stable then consider restarting Eliquis in a.m. CBC for a.m.   Discussed with family and Dr. Mortimer Fries who are in agreement  CODE STATUS: DNR  TOTAL TIME TAKING CARE OF THIS PATIENT: 26 minutes.   Patient to be transferred to floor today.  Physical therapy consultation requested.  POSSIBLE D/C 2-3 days, DEPENDING ON CLINICAL CONDITION.   Makoa Satz M.D on 08/04/2018 at 12:26 PM  Between 7am to 6pm - Pager - [phone] After 6pm go to [email or web address] - password [email or web address] [facility]  Office  [phone]  CC: Primary care physician; Birdie Sons, MD  Note: This dictation was prepared with Dragon dictation along with smaller phrase technology. Any transcriptional errors that result from this process are unintentional.

## 2018-08-04 NOTE — Progress Notes (Signed)
New referral for inpatient hospice.  Spoke with the patient and family regarding inpatient hospice services.  All in agreement with and accept bed offer.  Patient to transport to the hospice home this evening with EMS transport set up by this RN for pick up at 2000.  Report called to the hospice home.  Portable DNR to accompany the patient.  Dimas Aguas, RN [facility]

## 2018-08-04 NOTE — Clinical Social Work Note (Signed)
Patient will discharge to hospice home in [facility] today. Patient and children are aware. Patient will be transported by EMS. Marcene Brawn, hospice liaison will call report and call for transport.   [place], [place]

## 2018-09-06 DEATH — deceased

## 2018-09-26 ENCOUNTER — Ambulatory Visit: Payer: Self-pay

## 2019-02-12 IMAGING — DX DG CHEST 1V PORT
1 series · 1 of 1 positions shown · non-contrast
Comparison: February 19, 2018

CLINICAL DATA: Recent discharge for pneumonia.

EXAM:
PORTABLE CHEST 1 VIEW

[chest ap]
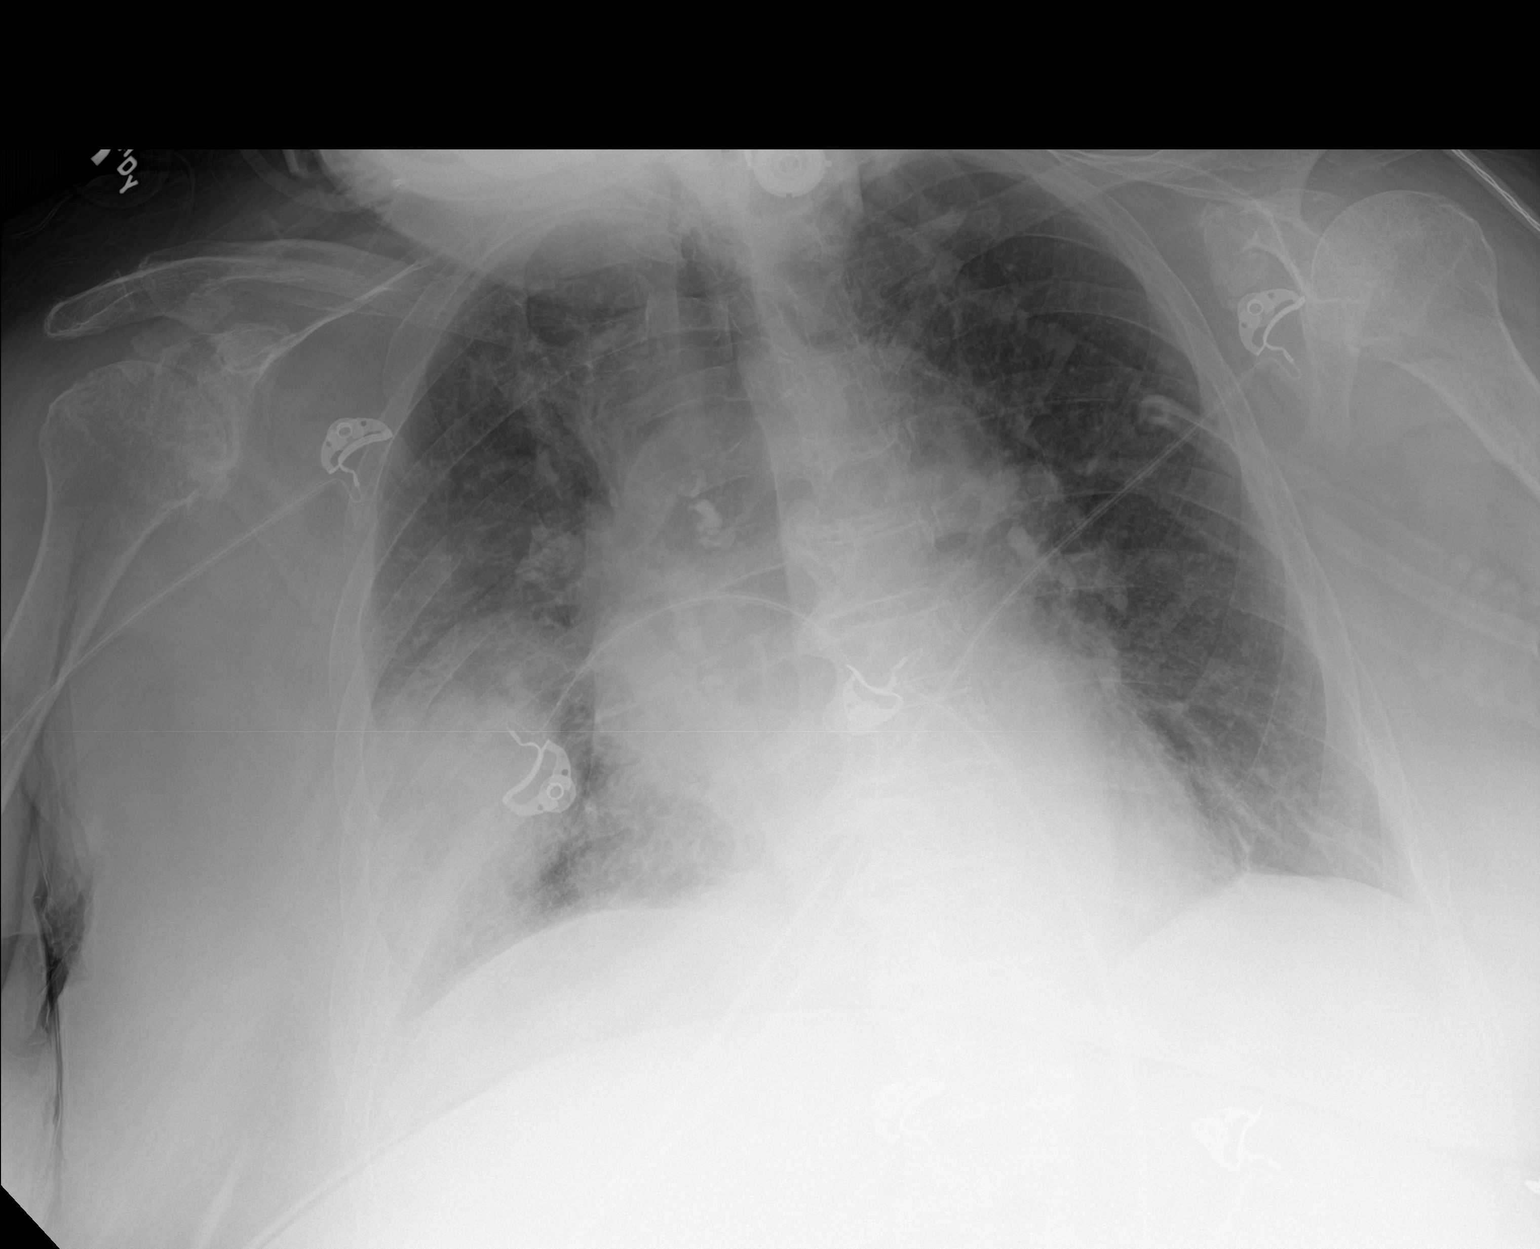

[1 of 1 positions shown; findings below may reference images not displayed]

FINDINGS: No pneumothorax. The cardiomediastinal silhouette is stable. New
rounded infiltrate in the lateral right base. No other interval
change.
IMPRESSION: New rounded infiltrate in the lateral right lung base may represent
pneumonia given history. Recommend short-term follow-up to ensure
resolution.

## 2019-02-20 IMAGING — CT CT CHEST W/ CM
2 of 3 series · 15 of 36 positions shown, 18 images · IV contrast (omnipaque)
Comparison: Chest x-ray 03/11/2018.  Chest CT 12/24/2015

CLINICAL DATA: Shortness of breath, pneumonia, respiratory failure,
hypoxia

EXAM:
CT CHEST WITH CONTRAST
TECHNIQUE: Multidetector CT imaging of the chest was performed during
intravenous contrast administration.
CONTRAST:  75mL OMNIPAQUE IOHEXOL 300 MG/ML  SOLN

[Series 2: axial st · axial · 0.65mm/px · z∈[-439,-153]mm · 12 of 169 slices shown, 15 images]
[im 13/169  mediastinal]
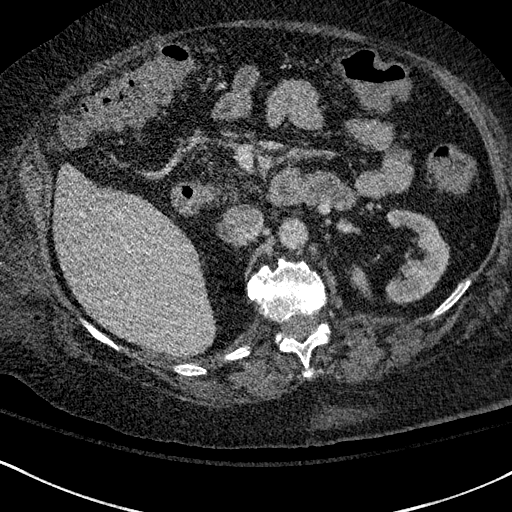
[im 13/169  lung]
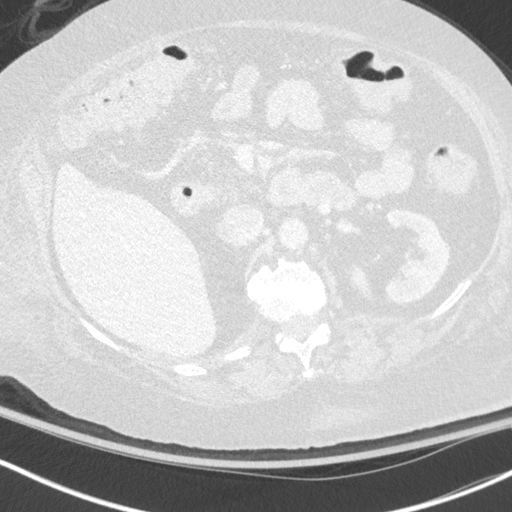
[im 25/169  lung]
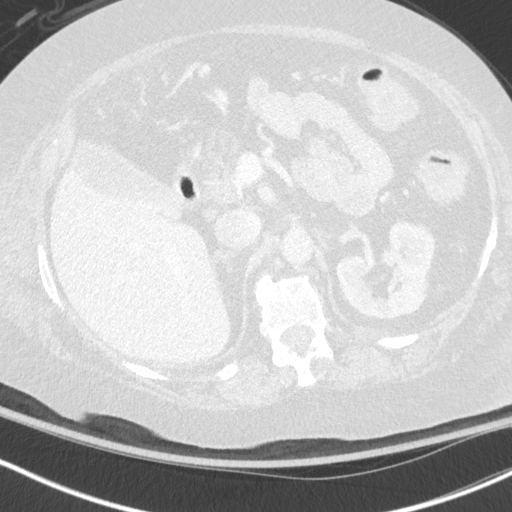
[im 38/169  lung]
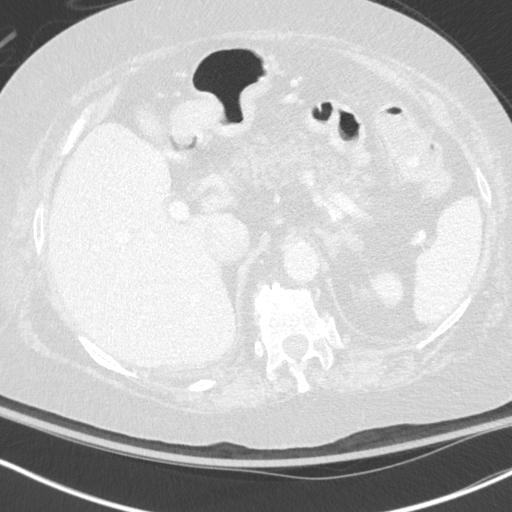
[im 50/169  lung]
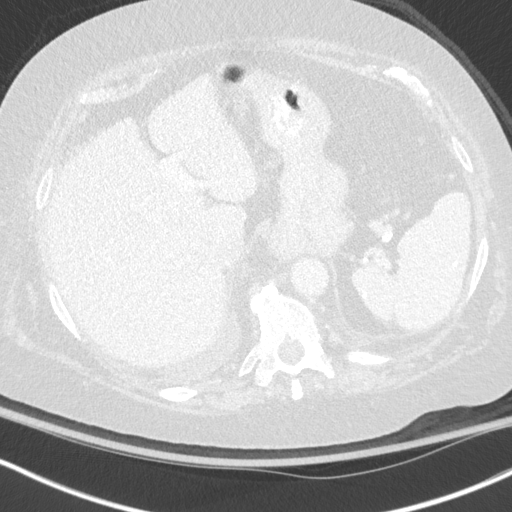
[im 63/169  mediastinal]
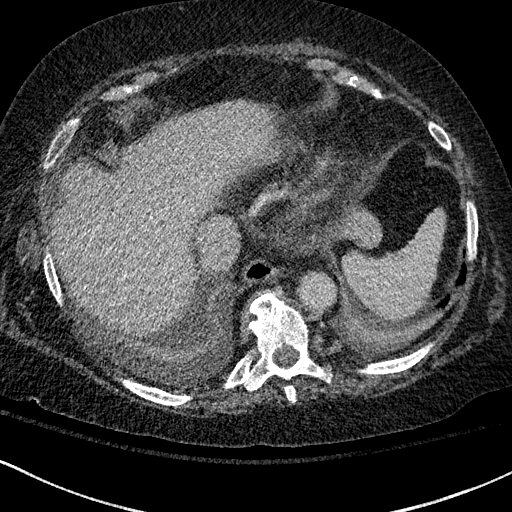
[im 63/169  lung]
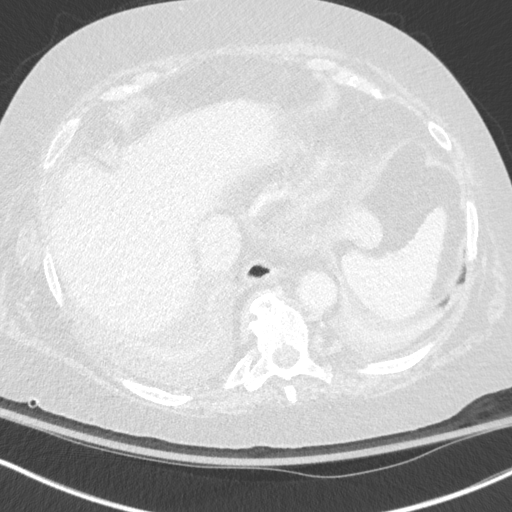
[im 75/169  lung]
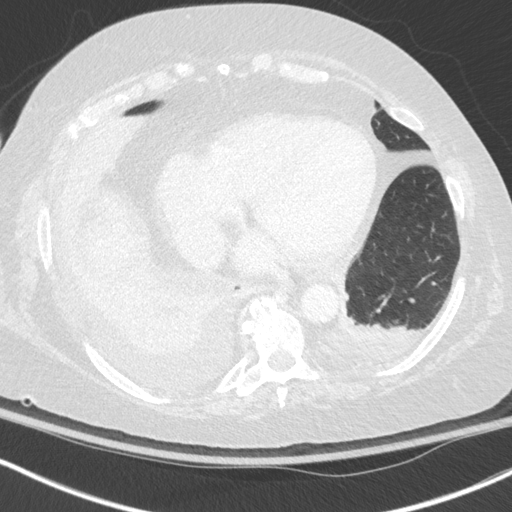
[im 94/169  lung]
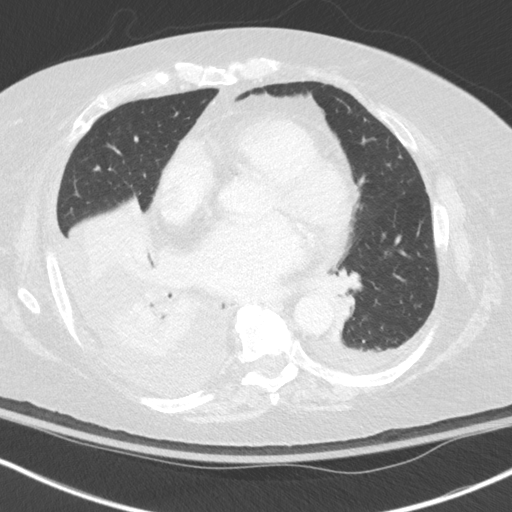
[im 106/169  lung]
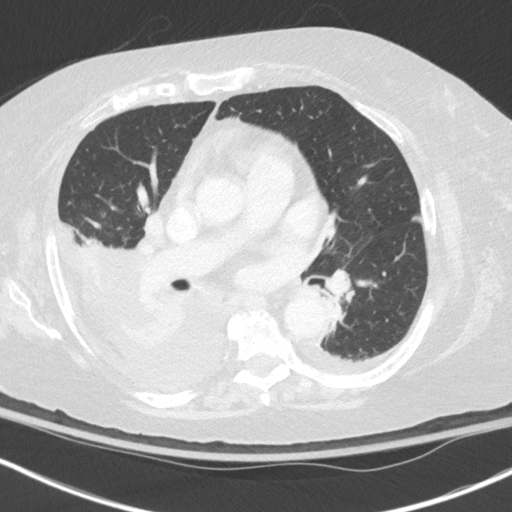
[im 119/169  mediastinal]
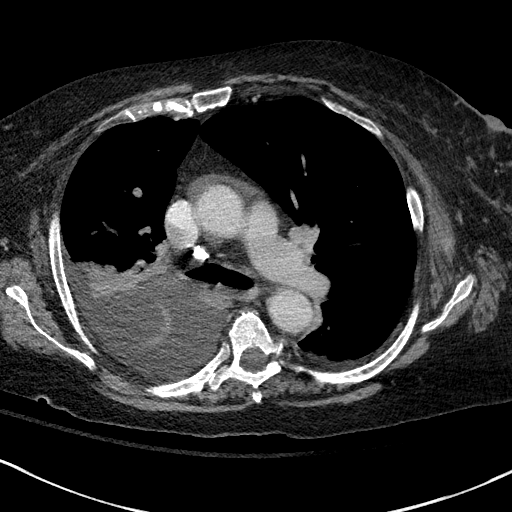
[im 119/169  lung]
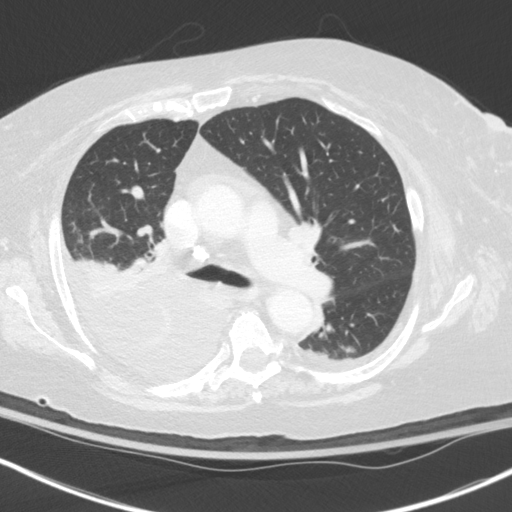
[im 131/169  lung]
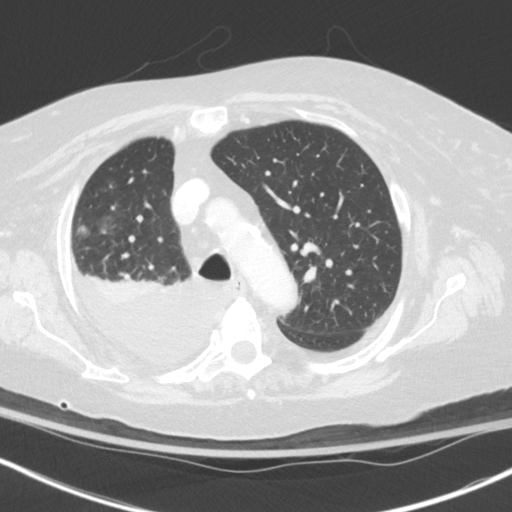
[im 144/169  lung]
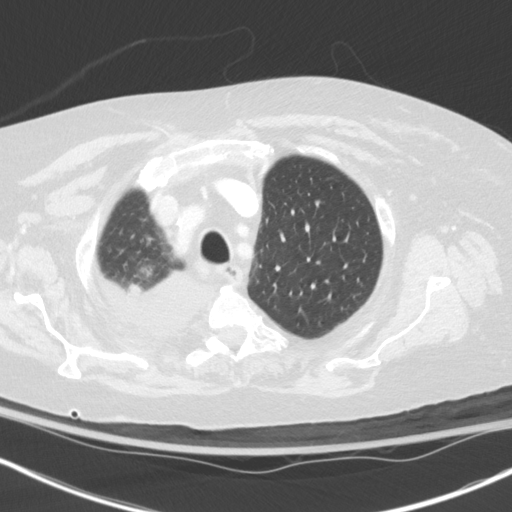
[im 156/169  lung]
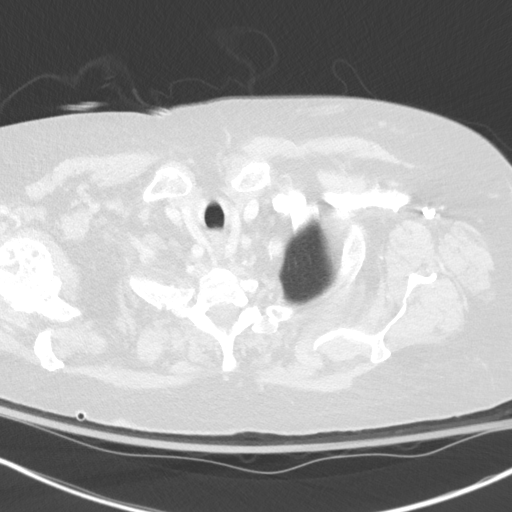

[Series 5: coronal · coronal · 0.66mm/px · 3 of 118 slices shown]
[im 24/118  lung]
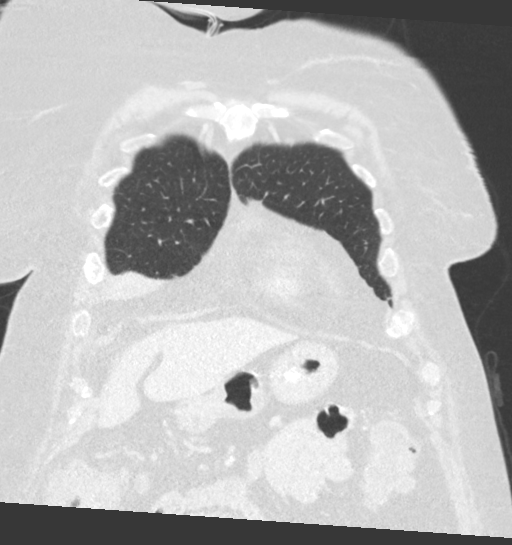
[im 47/118  lung]
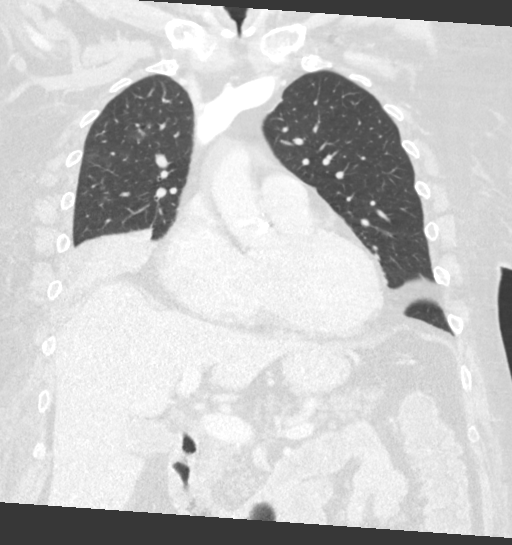
[im 71/118  lung]
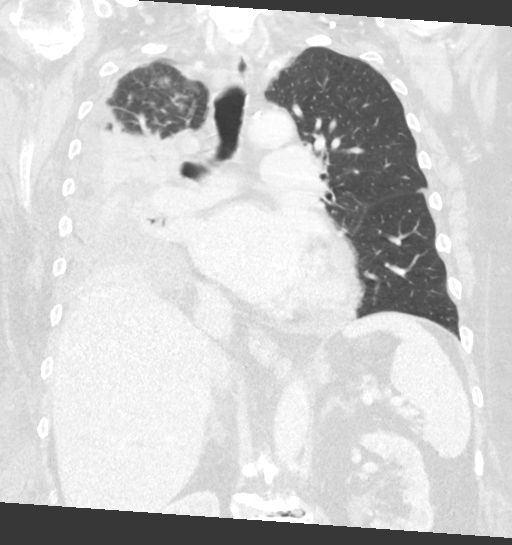

[15 of 36 positions shown; findings below may reference images not displayed]

FINDINGS: Cardiovascular: Cardiomegaly. Scattered coronary artery and aortic
calcifications. No aneurysm.

Mediastinum/Nodes: No mediastinal, hilar, or axillary adenopathy.
Calcified right paratracheal lymph node

Lungs/Pleura: Large right pleural effusion and small left pleural
effusion. Consolidation in much of the right lower lobe could
reflect compressive atelectasis or pneumonia. Similar consolidation
in the right middle lobe. Compressive atelectasis in the posterior
aspect of the right upper lobe. Mild scattered ground-glass
tree-in-bud nodular densities in the aerated right upper lobe,
likely infectious/inflammatory. Compressive atelectasis in the left
lower lobe.

Upper Abdomen: Calcifications in the spleen compatible with old
granulomatous disease. No acute findings.

Musculoskeletal: Chest wall soft tissues are unremarkable. No acute
bony abnormality.
IMPRESSION: Large right pleural effusion with compressive atelectasis versus
pneumonia involving the right middle lobe and right lower lobe.
Ground-glass tree-in-bud nodular densities in the right upper lobe,
likely infectious/inflammatory.

Small left effusion with slight compressive atelectasis in the left
lower lobe.

Mild cardiomegaly. Coronary artery disease and aortic
atherosclerosis.

## 2019-07-11 IMAGING — DX DG CHEST 1V PORT
1 series · 1 of 1 positions shown · non-contrast
Comparison: 07/04/2018

CLINICAL DATA: Respiratory distress.

EXAM:
PORTABLE CHEST 1 VIEW

[chest ap]
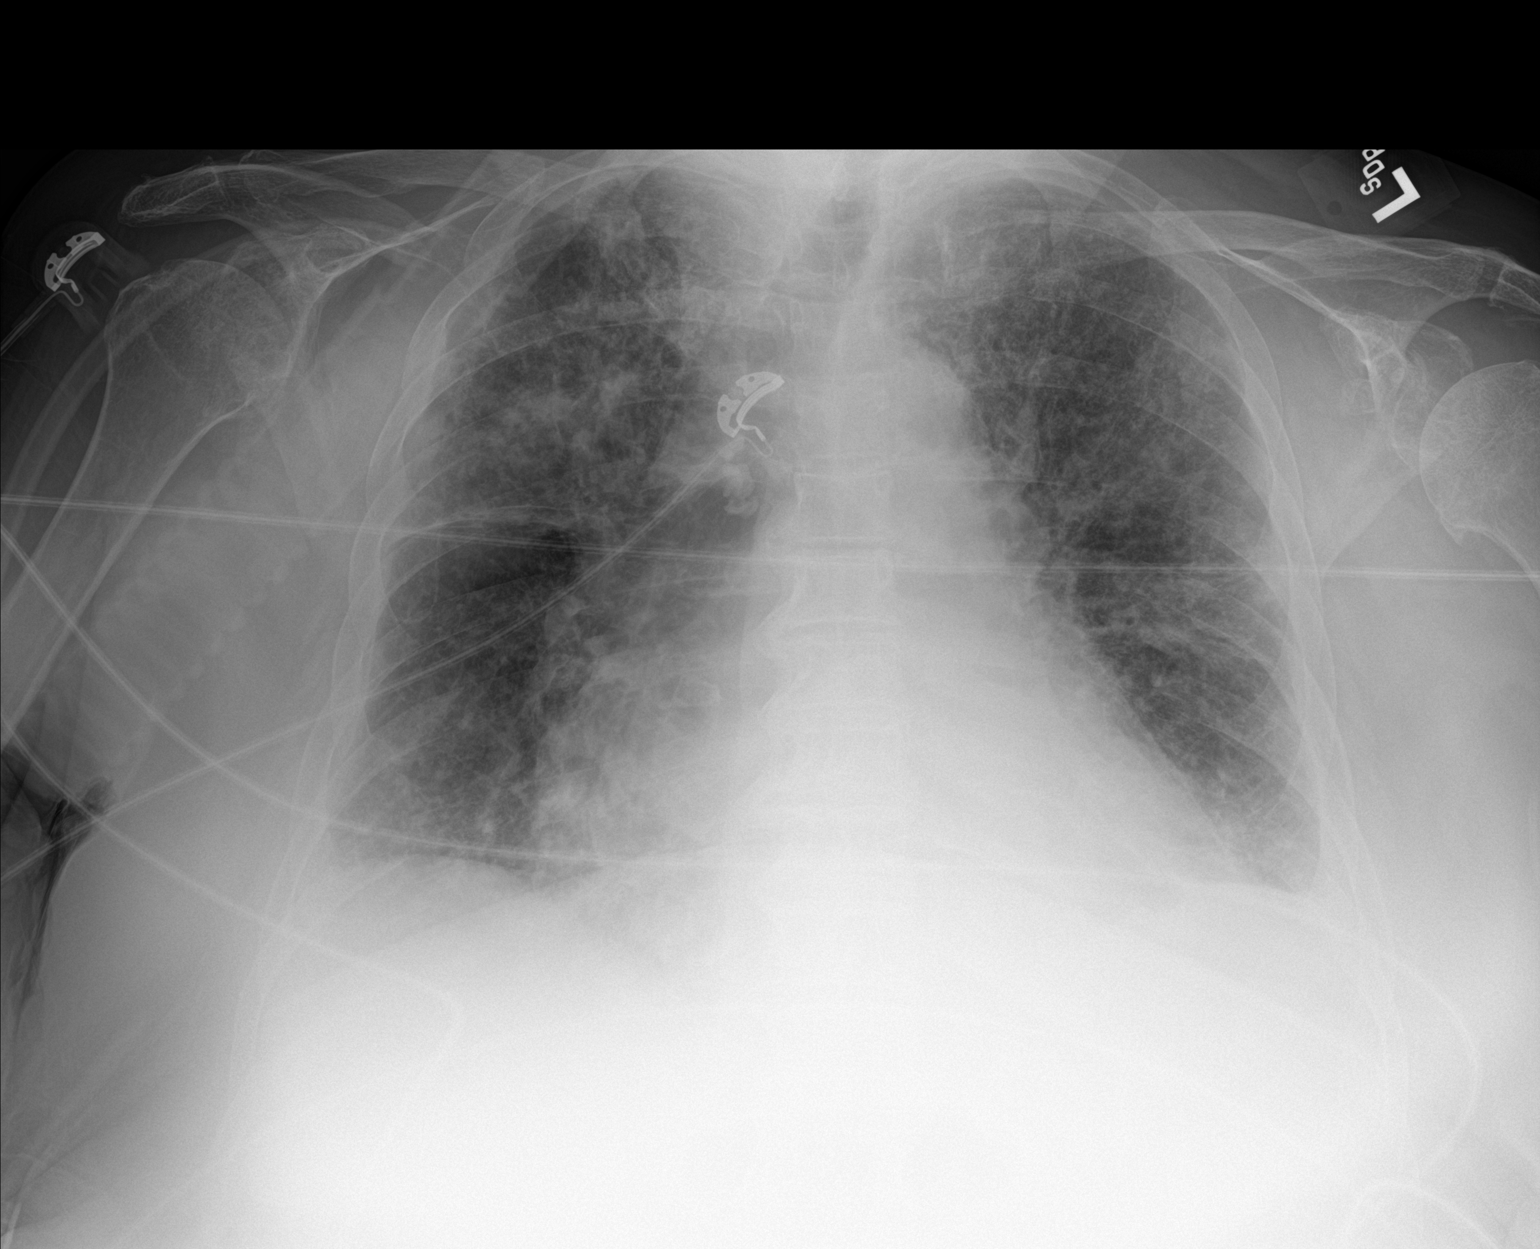

[1 of 1 positions shown; findings below may reference images not displayed]

FINDINGS: Shallow inspiration. Cardiac enlargement. Bilateral perihilar
airspace and interstitial infiltrates likely representing edema.
Multifocal pneumonia is also a possibility. No blunting of
costophrenic angles. No pneumothorax. Mediastinal contours appear
intact.
IMPRESSION: Cardiac enlargement with bilateral perihilar infiltrates likely
representing edema.
